# Patient Record
Sex: Female | Born: 1960 | Race: White | Hispanic: No | Marital: Single | Smoking: Never smoker
Health system: Southern US, Community
[De-identification: ages and names within clinical notes are randomized; demographics above are authoritative.]

## PROBLEM LIST (undated history)

## (undated) DIAGNOSIS — E7800 Pure hypercholesterolemia, unspecified: Secondary | ICD-10-CM

## (undated) DIAGNOSIS — K219 Gastro-esophageal reflux disease without esophagitis: Secondary | ICD-10-CM

## (undated) DIAGNOSIS — R112 Nausea with vomiting, unspecified: Secondary | ICD-10-CM

## (undated) DIAGNOSIS — T148XXA Other injury of unspecified body region, initial encounter: Secondary | ICD-10-CM

## (undated) DIAGNOSIS — Z9109 Other allergy status, other than to drugs and biological substances: Secondary | ICD-10-CM

## (undated) DIAGNOSIS — K648 Other hemorrhoids: Secondary | ICD-10-CM

## (undated) DIAGNOSIS — J189 Pneumonia, unspecified organism: Secondary | ICD-10-CM

## (undated) DIAGNOSIS — I10 Essential (primary) hypertension: Secondary | ICD-10-CM

## (undated) DIAGNOSIS — K5710 Diverticulosis of small intestine without perforation or abscess without bleeding: Secondary | ICD-10-CM

## (undated) DIAGNOSIS — D649 Anemia, unspecified: Secondary | ICD-10-CM

## (undated) DIAGNOSIS — K222 Esophageal obstruction: Secondary | ICD-10-CM

## (undated) DIAGNOSIS — U071 COVID-19: Secondary | ICD-10-CM

## (undated) DIAGNOSIS — E663 Overweight: Secondary | ICD-10-CM

## (undated) DIAGNOSIS — F32A Depression, unspecified: Secondary | ICD-10-CM

## (undated) DIAGNOSIS — G43909 Migraine, unspecified, not intractable, without status migrainosus: Secondary | ICD-10-CM

## (undated) DIAGNOSIS — M3500 Sicca syndrome, unspecified: Secondary | ICD-10-CM

## (undated) DIAGNOSIS — R040 Epistaxis: Secondary | ICD-10-CM

## (undated) DIAGNOSIS — I700 Atherosclerosis of aorta: Secondary | ICD-10-CM

## (undated) DIAGNOSIS — M329 Systemic lupus erythematosus, unspecified: Secondary | ICD-10-CM

## (undated) DIAGNOSIS — R0600 Dyspnea, unspecified: Secondary | ICD-10-CM

## (undated) DIAGNOSIS — L500 Allergic urticaria: Secondary | ICD-10-CM

## (undated) DIAGNOSIS — R7303 Prediabetes: Secondary | ICD-10-CM

## (undated) DIAGNOSIS — K920 Hematemesis: Secondary | ICD-10-CM

## (undated) DIAGNOSIS — F329 Major depressive disorder, single episode, unspecified: Secondary | ICD-10-CM

## (undated) DIAGNOSIS — J42 Unspecified chronic bronchitis: Secondary | ICD-10-CM

## (undated) DIAGNOSIS — E039 Hypothyroidism, unspecified: Secondary | ICD-10-CM

## (undated) DIAGNOSIS — N951 Menopausal and female climacteric states: Secondary | ICD-10-CM

## (undated) DIAGNOSIS — F419 Anxiety disorder, unspecified: Secondary | ICD-10-CM

## (undated) DIAGNOSIS — T7840XA Allergy, unspecified, initial encounter: Secondary | ICD-10-CM

## (undated) DIAGNOSIS — K2970 Gastritis, unspecified, without bleeding: Secondary | ICD-10-CM

## (undated) DIAGNOSIS — Z9889 Other specified postprocedural states: Secondary | ICD-10-CM

## (undated) DIAGNOSIS — K5790 Diverticulosis of intestine, part unspecified, without perforation or abscess without bleeding: Secondary | ICD-10-CM

## (undated) DIAGNOSIS — K449 Diaphragmatic hernia without obstruction or gangrene: Secondary | ICD-10-CM

## (undated) HISTORY — DX: Major depressive disorder, single episode, unspecified: F32.9

## (undated) HISTORY — DX: Hypothyroidism, unspecified: E03.9

## (undated) HISTORY — DX: Sjogren syndrome, unspecified: M35.00

## (undated) HISTORY — DX: Hematemesis: K92.0

## (undated) HISTORY — DX: Overweight: E66.3

## (undated) HISTORY — DX: Menopausal and female climacteric states: N95.1

## (undated) HISTORY — DX: Allergic urticaria: L50.0

## (undated) HISTORY — DX: Gastritis, unspecified, without bleeding: K29.70

## (undated) HISTORY — PX: HERNIA REPAIR: SHX51

## (undated) HISTORY — DX: Other hemorrhoids: K64.8

## (undated) HISTORY — DX: Epistaxis: R04.0

## (undated) HISTORY — PX: FOOT SURGERY: SHX648

## (undated) HISTORY — DX: Anxiety disorder, unspecified: F41.9

## (undated) HISTORY — DX: Atherosclerosis of aorta: I70.0

## (undated) HISTORY — DX: Allergy, unspecified, initial encounter: T78.40XA

## (undated) HISTORY — DX: Diverticulosis of small intestine without perforation or abscess without bleeding: K57.10

## (undated) HISTORY — DX: Depression, unspecified: F32.A

## (undated) HISTORY — DX: Diverticulosis of intestine, part unspecified, without perforation or abscess without bleeding: K57.90

## (undated) HISTORY — DX: Diaphragmatic hernia without obstruction or gangrene: K44.9

## (undated) HISTORY — DX: COVID-19: U07.1

## (undated) HISTORY — DX: Other injury of unspecified body region, initial encounter: T14.8XXA

## (undated) HISTORY — PX: INGUINAL HERNIA REPAIR: SUR1180

## (undated) HISTORY — DX: Gastro-esophageal reflux disease without esophagitis: K21.9

## (undated) HISTORY — DX: Pneumonia, unspecified organism: J18.9

## (undated) HISTORY — DX: Systemic lupus erythematosus, unspecified: M32.9

## (undated) HISTORY — DX: Esophageal obstruction: K22.2

---
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
## 1985-10-22 HISTORY — PX: EXPLORATORY LAPAROTOMY: SUR591

## 1985-10-22 HISTORY — PX: APPENDECTOMY: SHX54

## 1993-10-22 HISTORY — PX: SHOULDER OPEN ROTATOR CUFF REPAIR: SHX2407

## 1995-10-23 HISTORY — PX: SHOULDER SURGERY: SHX246

## 1996-10-22 HISTORY — PX: TUBAL LIGATION: SHX77

## 2005-06-20 ENCOUNTER — Emergency Department (HOSPITAL_COMMUNITY): Admission: EM | Admit: 2005-06-20 | Discharge: 2005-06-20 | Payer: Self-pay | Admitting: Family Medicine

## 2005-07-07 ENCOUNTER — Emergency Department (HOSPITAL_COMMUNITY): Admission: EM | Admit: 2005-07-07 | Discharge: 2005-07-07 | Payer: Self-pay | Admitting: Family Medicine

## 2005-08-04 ENCOUNTER — Emergency Department (HOSPITAL_COMMUNITY): Admission: EM | Admit: 2005-08-04 | Discharge: 2005-08-04 | Payer: Self-pay | Admitting: Family Medicine

## 2005-12-06 ENCOUNTER — Ambulatory Visit: Payer: Self-pay | Admitting: Internal Medicine

## 2006-01-01 ENCOUNTER — Ambulatory Visit: Payer: Self-pay | Admitting: Internal Medicine

## 2006-01-09 ENCOUNTER — Emergency Department (HOSPITAL_COMMUNITY): Admission: EM | Admit: 2006-01-09 | Discharge: 2006-01-09 | Payer: Self-pay | Admitting: Emergency Medicine

## 2006-11-08 ENCOUNTER — Ambulatory Visit: Payer: Self-pay | Admitting: Internal Medicine

## 2007-04-07 ENCOUNTER — Ambulatory Visit: Payer: Self-pay | Admitting: Internal Medicine

## 2007-04-11 ENCOUNTER — Encounter: Payer: Self-pay | Admitting: Internal Medicine

## 2007-04-11 ENCOUNTER — Telehealth: Payer: Self-pay | Admitting: Internal Medicine

## 2007-06-06 ENCOUNTER — Ambulatory Visit: Payer: Self-pay | Admitting: Internal Medicine

## 2007-06-13 LAB — CONVERTED CEMR LAB
BUN: 13 mg/dL
Basophils Absolute: 0 10*3/uL
Basophils Relative: 0 %
CO2: 19 meq/L
Calcium: 9.5 mg/dL
Chloride: 107 meq/L
Creatinine, Ser: 0.74 mg/dL
Eosinophils Absolute: 0.1 10*3/uL
Eosinophils Relative: 1 %
Ferritin: 6 ng/mL — ABNORMAL LOW
Folate: >20 ng/mL
Glucose, Bld: 86 mg/dL
HCT: 36.1 %
Hemoglobin: 11.1 g/dL — ABNORMAL LOW
Iron: 35 ug/dL — ABNORMAL LOW
Lymphocytes Relative: 25 %
Lymphs Abs: 2.1 10*3/uL
MCHC: 30.7 g/dL
MCV: 77.5 fL — ABNORMAL LOW
Monocytes Absolute: 0.9 10*3/uL — ABNORMAL HIGH
Monocytes Relative: 11 %
Neutro Abs: 5.5 10*3/uL
Neutrophils Relative %: 63 %
Platelets: 343 10*3/uL
Potassium: 3.9 meq/L
RBC: 4.66 M/uL
RDW: 15.9 % — ABNORMAL HIGH
Saturation Ratios: 8 % — ABNORMAL LOW
Sodium: 138 meq/L
TIBC: 425 ug/dL
UIBC: 390 ug/dL
Vitamin B-12: 537 pg/mL
WBC: 8.7 10*3/uL

## 2007-06-30 ENCOUNTER — Telehealth (INDEPENDENT_AMBULATORY_CARE_PROVIDER_SITE_OTHER): Payer: Self-pay | Admitting: *Deleted

## 2007-07-02 ENCOUNTER — Telehealth: Payer: Self-pay | Admitting: Internal Medicine

## 2007-07-03 ENCOUNTER — Ambulatory Visit: Payer: Self-pay | Admitting: Internal Medicine

## 2007-07-03 LAB — CONVERTED CEMR LAB
Basophils Absolute: 0 10*3/uL (ref 0.0–0.1)
Basophils Relative: 0 % (ref 0.0–1.0)
Bilirubin Urine: NEGATIVE
Eosinophils Absolute: 0.1 10*3/uL (ref 0.0–0.6)
Eosinophils Relative: 2 % (ref 0.0–5.0)
Glucose, Urine, Semiquant: NEGATIVE
HCT: 37 % (ref 36.0–46.0)
Hemoglobin: 12.6 g/dL (ref 12.0–15.0)
Ketones, urine, test strip: NEGATIVE
Lymphocytes Relative: 27.3 % (ref 12.0–46.0)
MCHC: 34.1 g/dL (ref 30.0–36.0)
MCV: 80.8 fL (ref 78.0–100.0)
Monocytes Absolute: 0.6 10*3/uL (ref 0.2–0.7)
Monocytes Relative: 11.2 % — ABNORMAL HIGH (ref 3.0–11.0)
Neutro Abs: 3 10*3/uL (ref 1.4–7.7)
Neutrophils Relative %: 59.5 % (ref 43.0–77.0)
Nitrite: NEGATIVE
Platelets: 281 10*3/uL (ref 150–400)
Protein, U semiquant: NEGATIVE
RBC: 4.58 M/uL (ref 3.87–5.11)
RDW: 21.4 % — ABNORMAL HIGH (ref 11.5–14.6)
Specific Gravity, Urine: <1.005
Urobilinogen, UA: NEGATIVE
WBC Urine, dipstick: NEGATIVE
WBC: 5.1 10*3/uL (ref 4.5–10.5)
pH: 5

## 2007-09-19 ENCOUNTER — Encounter (INDEPENDENT_AMBULATORY_CARE_PROVIDER_SITE_OTHER): Payer: Self-pay | Admitting: *Deleted

## 2007-10-31 ENCOUNTER — Telehealth (INDEPENDENT_AMBULATORY_CARE_PROVIDER_SITE_OTHER): Payer: Self-pay | Admitting: *Deleted

## 2008-01-21 ENCOUNTER — Encounter: Payer: Self-pay | Admitting: Obstetrics and Gynecology

## 2008-01-21 ENCOUNTER — Ambulatory Visit: Payer: Self-pay | Admitting: Gynecology

## 2008-02-18 ENCOUNTER — Ambulatory Visit: Payer: Self-pay | Admitting: Obstetrics and Gynecology

## 2008-02-18 ENCOUNTER — Other Ambulatory Visit: Admission: RE | Admit: 2008-02-18 | Discharge: 2008-02-18 | Payer: Self-pay | Admitting: Obstetrics and Gynecology

## 2008-02-18 ENCOUNTER — Encounter: Payer: Self-pay | Admitting: Obstetrics and Gynecology

## 2008-02-26 ENCOUNTER — Ambulatory Visit (HOSPITAL_COMMUNITY): Admission: RE | Admit: 2008-02-26 | Discharge: 2008-02-26 | Payer: Self-pay | Admitting: Obstetrics & Gynecology

## 2008-02-29 ENCOUNTER — Emergency Department (HOSPITAL_COMMUNITY): Admission: EM | Admit: 2008-02-29 | Discharge: 2008-02-29 | Payer: Self-pay | Admitting: Emergency Medicine

## 2008-03-08 ENCOUNTER — Encounter: Admission: RE | Admit: 2008-03-08 | Discharge: 2008-03-08 | Payer: Self-pay | Admitting: Family Medicine

## 2008-05-13 ENCOUNTER — Ambulatory Visit: Payer: Self-pay | Admitting: Obstetrics and Gynecology

## 2008-05-16 ENCOUNTER — Emergency Department (HOSPITAL_BASED_OUTPATIENT_CLINIC_OR_DEPARTMENT_OTHER): Admission: EM | Admit: 2008-05-16 | Discharge: 2008-05-16 | Payer: Self-pay | Admitting: Emergency Medicine

## 2008-06-07 ENCOUNTER — Emergency Department (HOSPITAL_BASED_OUTPATIENT_CLINIC_OR_DEPARTMENT_OTHER): Admission: EM | Admit: 2008-06-07 | Discharge: 2008-06-07 | Payer: Self-pay | Admitting: Emergency Medicine

## 2008-08-05 ENCOUNTER — Ambulatory Visit: Payer: Self-pay | Admitting: Obstetrics & Gynecology

## 2008-08-12 ENCOUNTER — Encounter: Admission: RE | Admit: 2008-08-12 | Discharge: 2008-08-12 | Payer: Self-pay | Admitting: Obstetrics & Gynecology

## 2008-08-26 ENCOUNTER — Ambulatory Visit: Payer: Self-pay | Admitting: Cardiology

## 2008-08-26 ENCOUNTER — Telehealth (INDEPENDENT_AMBULATORY_CARE_PROVIDER_SITE_OTHER): Payer: Self-pay | Admitting: *Deleted

## 2008-08-26 ENCOUNTER — Ambulatory Visit: Payer: Self-pay | Admitting: Internal Medicine

## 2008-08-31 ENCOUNTER — Telehealth: Payer: Self-pay | Admitting: Internal Medicine

## 2008-10-08 ENCOUNTER — Encounter (INDEPENDENT_AMBULATORY_CARE_PROVIDER_SITE_OTHER): Payer: Self-pay | Admitting: *Deleted

## 2008-12-24 ENCOUNTER — Emergency Department (HOSPITAL_BASED_OUTPATIENT_CLINIC_OR_DEPARTMENT_OTHER): Admission: EM | Admit: 2008-12-24 | Discharge: 2008-12-24 | Payer: Self-pay | Admitting: Emergency Medicine

## 2008-12-24 ENCOUNTER — Ambulatory Visit: Payer: Self-pay | Admitting: Radiology

## 2009-05-05 ENCOUNTER — Emergency Department (HOSPITAL_BASED_OUTPATIENT_CLINIC_OR_DEPARTMENT_OTHER): Admission: EM | Admit: 2009-05-05 | Discharge: 2009-05-05 | Payer: Self-pay | Admitting: Emergency Medicine

## 2009-07-26 ENCOUNTER — Emergency Department (HOSPITAL_BASED_OUTPATIENT_CLINIC_OR_DEPARTMENT_OTHER): Admission: EM | Admit: 2009-07-26 | Discharge: 2009-07-26 | Payer: Self-pay | Admitting: Emergency Medicine

## 2009-09-01 ENCOUNTER — Ambulatory Visit: Payer: Self-pay | Admitting: Obstetrics & Gynecology

## 2009-09-01 ENCOUNTER — Encounter: Payer: Self-pay | Admitting: Obstetrics & Gynecology

## 2009-09-02 ENCOUNTER — Encounter: Payer: Self-pay | Admitting: Obstetrics & Gynecology

## 2009-09-02 LAB — CONVERTED CEMR LAB
Pap Smear: NORMAL
TSH: 14.707 microintl units/mL — ABNORMAL HIGH (ref 0.350–4.500)

## 2009-09-09 ENCOUNTER — Ambulatory Visit: Payer: Self-pay | Admitting: Family Medicine

## 2009-09-09 ENCOUNTER — Encounter: Payer: Self-pay | Admitting: Family Medicine

## 2009-09-09 DIAGNOSIS — I10 Essential (primary) hypertension: Secondary | ICD-10-CM | POA: Insufficient documentation

## 2009-09-09 DIAGNOSIS — E039 Hypothyroidism, unspecified: Secondary | ICD-10-CM | POA: Insufficient documentation

## 2009-09-09 LAB — CONVERTED CEMR LAB
BUN: 9 mg/dL (ref 6–23)
CO2: 20 meq/L (ref 19–32)
Calcium: 9.6 mg/dL (ref 8.4–10.5)
Chloride: 106 meq/L (ref 96–112)
Creatinine, Ser: 0.84 mg/dL (ref 0.40–1.20)
Free T4: 1.22 ng/dL (ref 0.80–1.80)
Glucose, Bld: 94 mg/dL (ref 70–99)
Potassium: 4.1 meq/L (ref 3.5–5.3)
Sodium: 141 meq/L (ref 135–145)

## 2009-09-12 ENCOUNTER — Telehealth: Payer: Self-pay | Admitting: Family Medicine

## 2009-09-13 ENCOUNTER — Encounter: Payer: Self-pay | Admitting: Family Medicine

## 2009-09-13 LAB — CONVERTED CEMR LAB: T3, Free: 3 pg/mL (ref 2.3–4.2)

## 2009-09-23 ENCOUNTER — Ambulatory Visit (HOSPITAL_COMMUNITY): Admission: RE | Admit: 2009-09-23 | Discharge: 2009-09-23 | Payer: Self-pay | Admitting: Obstetrics & Gynecology

## 2009-10-04 ENCOUNTER — Ambulatory Visit: Payer: Self-pay | Admitting: Family Medicine

## 2009-10-04 DIAGNOSIS — G43909 Migraine, unspecified, not intractable, without status migrainosus: Secondary | ICD-10-CM | POA: Insufficient documentation

## 2009-10-04 LAB — CONVERTED CEMR LAB
Bilirubin Urine: NEGATIVE
Glucose, Urine, Semiquant: NEGATIVE
Ketones, urine, test strip: NEGATIVE
Nitrite: NEGATIVE
Protein, U semiquant: NEGATIVE
Specific Gravity, Urine: 1.02
Urobilinogen, UA: 0.2
WBC Urine, dipstick: NEGATIVE
pH: 5.5

## 2009-10-05 ENCOUNTER — Encounter: Payer: Self-pay | Admitting: Family Medicine

## 2009-10-12 ENCOUNTER — Ambulatory Visit: Payer: Self-pay | Admitting: Family Medicine

## 2009-10-12 DIAGNOSIS — F331 Major depressive disorder, recurrent, moderate: Secondary | ICD-10-CM | POA: Insufficient documentation

## 2009-10-12 LAB — CONVERTED CEMR LAB
Ketones, urine, test strip: NEGATIVE
Nitrite: NEGATIVE
Urobilinogen, UA: 0.2

## 2009-10-17 ENCOUNTER — Ambulatory Visit (HOSPITAL_COMMUNITY): Admission: RE | Admit: 2009-10-17 | Discharge: 2009-10-17 | Payer: Self-pay | Admitting: Family Medicine

## 2009-10-17 ENCOUNTER — Telehealth: Payer: Self-pay | Admitting: Family Medicine

## 2009-11-08 ENCOUNTER — Ambulatory Visit: Payer: Self-pay | Admitting: Family Medicine

## 2009-11-08 ENCOUNTER — Encounter: Payer: Self-pay | Admitting: Family Medicine

## 2009-11-08 LAB — CONVERTED CEMR LAB: Free T4: 1.78 ng/dL (ref 0.80–1.80)

## 2009-12-05 ENCOUNTER — Telehealth: Payer: Self-pay | Admitting: Family Medicine

## 2009-12-06 ENCOUNTER — Ambulatory Visit: Payer: Self-pay | Admitting: Family Medicine

## 2009-12-22 ENCOUNTER — Telehealth: Payer: Self-pay | Admitting: Family Medicine

## 2010-01-12 ENCOUNTER — Encounter: Payer: Self-pay | Admitting: Family Medicine

## 2010-01-12 ENCOUNTER — Ambulatory Visit: Payer: Self-pay | Admitting: Family Medicine

## 2010-01-13 LAB — CONVERTED CEMR LAB: TSH: 0.04 microintl units/mL — ABNORMAL LOW (ref 0.350–4.500)

## 2010-01-24 ENCOUNTER — Ambulatory Visit: Payer: Self-pay | Admitting: Family Medicine

## 2010-02-12 ENCOUNTER — Emergency Department (HOSPITAL_BASED_OUTPATIENT_CLINIC_OR_DEPARTMENT_OTHER): Admission: EM | Admit: 2010-02-12 | Discharge: 2010-02-12 | Payer: Self-pay | Admitting: Emergency Medicine

## 2010-02-20 ENCOUNTER — Telehealth: Payer: Self-pay | Admitting: Family Medicine

## 2010-02-23 ENCOUNTER — Encounter: Payer: Self-pay | Admitting: Family Medicine

## 2010-02-23 ENCOUNTER — Ambulatory Visit: Payer: Self-pay | Admitting: Family Medicine

## 2010-02-27 ENCOUNTER — Telehealth: Payer: Self-pay | Admitting: Family Medicine

## 2010-03-13 ENCOUNTER — Ambulatory Visit: Payer: Self-pay | Admitting: Family Medicine

## 2010-03-13 ENCOUNTER — Encounter: Payer: Self-pay | Admitting: *Deleted

## 2010-03-13 ENCOUNTER — Encounter: Payer: Self-pay | Admitting: Family Medicine

## 2010-03-15 ENCOUNTER — Ambulatory Visit: Payer: Self-pay | Admitting: Family Medicine

## 2010-03-21 ENCOUNTER — Encounter: Payer: Self-pay | Admitting: Family Medicine

## 2010-03-21 LAB — CONVERTED CEMR LAB: TSH: 9.028 microintl units/mL — ABNORMAL HIGH (ref 0.350–4.500)

## 2010-03-23 ENCOUNTER — Encounter: Payer: Self-pay | Admitting: Family Medicine

## 2010-03-23 ENCOUNTER — Ambulatory Visit: Payer: Self-pay | Admitting: Family Medicine

## 2010-03-23 ENCOUNTER — Telehealth: Payer: Self-pay | Admitting: Family Medicine

## 2010-03-23 LAB — CONVERTED CEMR LAB
Nitrite: NEGATIVE
Protein, U semiquant: NEGATIVE
Specific Gravity, Urine: 1.015

## 2010-03-24 ENCOUNTER — Telehealth: Payer: Self-pay | Admitting: *Deleted

## 2010-03-24 LAB — CONVERTED CEMR LAB
Chloride: 102 meq/L (ref 96–112)
Potassium: 3.8 meq/L (ref 3.5–5.3)

## 2010-03-27 ENCOUNTER — Ambulatory Visit: Payer: Self-pay | Admitting: Family Medicine

## 2010-03-29 ENCOUNTER — Telehealth (INDEPENDENT_AMBULATORY_CARE_PROVIDER_SITE_OTHER): Payer: Self-pay | Admitting: *Deleted

## 2010-03-30 ENCOUNTER — Ambulatory Visit: Payer: Self-pay | Admitting: Family Medicine

## 2010-05-12 ENCOUNTER — Ambulatory Visit: Payer: Self-pay | Admitting: Family Medicine

## 2010-06-23 ENCOUNTER — Ambulatory Visit: Payer: Self-pay | Admitting: Family Medicine

## 2010-10-06 ENCOUNTER — Encounter: Payer: Self-pay | Admitting: Family Medicine

## 2010-11-21 NOTE — Assessment & Plan Note (Signed)
Summary: TO SEE SYKES AT 12:00/KH   Vital Signs:  Patient profile:   50 year old female Height:      66 inches Weight:      181.0 pounds BMI:     29.32  Vitals Entered By: Wyona Almas PHD (March 30, 2010 12:21 PM)  Primary Care Provider:  Clementeen Graham MD   History of Present Illness: Assessment:  Spent 30 minutes with pt.  24-hr recall was not representative of usual b/c it was an exam day for Kacey, and she is getting over an upper respiratory infection, so has little appetite: B (5:30 AM)- 6 oz Coke; Snks (PM)- water; D (4:30 PM)- 1/1/2 c Honey Nut Cheerios w/ 1/2 c 2% milk.  Now that she is feeling better, and her semester is over as of today, she feels more able to get back to better eating and physical activity.    Nutrition Diagnosis:  Some progress on physical inactivity (NB-2.1) (prior to getting sick) related to time constraints (full-time std status and part-time employmt) as evidenced by self-reported increase in stairs and other "exercise opportunities."  Regression on disordered eating pattern (NB-1.5) related to being sick recently as evidenced by yesterday's intake of only one "meal," and a total kcal intake of  ~355.   Intervention:  See Patient Instructions.    Monitoring/Eval:  Dietary intake, body weight, and exercise at 4-wk F/U.      Allergies: 1)  ! Codeine 2)  ! Erythromycin   Complete Medication List: 1)  Hydrochlorothiazide 25 Mg Tabs (Hydrochlorothiazide) .... Take 1 pill by mouth daily 2)  Sprintec 28 0.25-35 Mg-mcg Tabs (Norgestimate-eth estradiol) 3)  Daily Vitamins For Women Tabs (Multiple vitamins-calcium) 4)  Synthroid 88 Mcg Tabs (Levothyroxine sodium) .Marland Kitchen.. 1 by mouth daily 5)  Claritin 10 Mg Tabs (Loratadine) .Marland Kitchen.. 1 by mouth daily 6)  Zoloft 100 Mg Tabs (Sertraline hcl) .Marland Kitchen.. 1 by mouth daily 7)  Propranolol Hcl 40 Mg Tabs (Propranolol hcl) .Marland Kitchen.. 1 daily 1 week prior to period to prevent migranes. 8)  Keflex 500 Mg Caps (Cephalexin) .Marland Kitchen.. 1 by mouth  two times a day x 3 days  Other Orders: Reassessment Each 15 min unit- [facility] ([id number])  Patient Instructions: 1)  Carry a water bottle with you, and DRINK!  Goal: 6-7 cups of water per day. Aim for at least 4 cups before 2 PM.   2)  Eat at least 3 meals and 1-2 snacks per day.  To make this happen, PLANNING AHEAD will be crucial.   3)  As you feel better, get back to those exercise opportunities, and keep walking the dog!  4)  Reminder:  Make sure dinner looks like, feels like, and tastes like a real dinner, i.e., include a protein source, starch food, and veg's.  Cooked veg's or a salad feel more like a meal than more raw veg's, which you are likely to tire of if you eat at every meal.   5)  With both lunch and dinner, try to plan around vegetables.  6)  Schedule a nutrition appt for July:  [phone].

## 2010-11-21 NOTE — Assessment & Plan Note (Signed)
Summary: Route to [facility] / [facility]   Vital Signs:  Patient profile:   50 year old female Height:      66 inches Weight:      181.8 pounds BMI:     29.45  Vitals Entered By: Wyona Almas PHD (Mar 13, 2010 3:10 PM)  Primary Care Provider:  Clementeen Graham MD   History of Present Illness: Assessment:  Spent 60 min with pt.  Gina Wise has made many changes, including avoiding soda and chocolate, eating at least a small amount of food for breakfast and lunch, incorporating vegetables daily, and decreasing salt and margarine.  She is frustrated that her weight is back up a couple of pounds, but acknowledges it may be b/c she is premenstrual.  She has not increased exercise, but still walks the dog  ~10 min 2 X day.   She and her family lost their residence, and are living at [facility] for now, on the 4th floor.  She has made a habit of using the stairs there, and says it is much easier now than when she began.  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  Financial constraints also make good food choices challenging, although her children are eligible for food stamps (as a student, she is not unless she also works at least 20 hr/wk).  24-hr recall suggests kcal intake of  ~1,000: B (6 AM)- strawberries, string chs; Snk (9 AM)- raw veg's, str chs; L (11:30 AM)- 1/2 pb & j, carrots; D (5:30 PM)- croissant bac, egg, chs sandwich, raw veg's w/ lite ranch.  Nutrition Diagnosis:  No progress on physical inactivity (NB-2.1) related to time constraints (full-time std status and part-time employmt) as evidenced by limited aerobic activity and no strength training. Slight progress on disordered eating pattern (NB-1.5) related to no appetite and time constraints as evidenced by reported typical intake of 1,000 kcal, but inclusion of 3 meals & 1-2 snacks most days.     Intervention:  See Patient Instructions.    Monitoring/Eval:  Dietary  intake, body weight, and exercise at 2-wk F/U.      Allergies: 1)  ! Codeine 2)  ! Erythromycin   Complete Medication List: 1)  Hydrochlorothiazide 25 Mg Tabs (Hydrochlorothiazide) .... Take 1 pill by mouth daily 2)  Sprintec 28 0.25-35 Mg-mcg Tabs (Norgestimate-eth estradiol) 3)  Daily Vitamins For Women Tabs (Multiple vitamins-calcium) 4)  Synthroid 75 Mcg Tabs (Levothyroxine sodium) .Marland Kitchen.. 1 by mouth daily 5)  Claritin 10 Mg Tabs (Loratadine) .Marland Kitchen.. 1 by mouth daily 6)  Zoloft 100 Mg Tabs (Sertraline hcl) .Marland Kitchen.. 1 by mouth daily 7)  Propranolol Hcl 40 Mg Tabs (Propranolol hcl) .Marland Kitchen.. 1 daily 1 week prior to period to prevent migranes.  Other Orders: Reassessment Each 15 min unit- [facility] [phone])  Patient Instructions: 1)  Congratulations on trying beans, eating more veg's, and for choosing more fiber-rich foods.  You are also doing a great job planning ahead, and bringing foods to school with you.  Glad to hear you are feeling more energetic.   2)  Dinner meal:  Make sure it looks like, feels like, and tastes like a real dinner, i.e., include a protein source, starch food, and veg's.  Cooked veg's or a salad feel more like a meal than more raw veg's, which you are likely to tire of if you eat at every meal.   3)  Remember the importance of satsifaction  from every meal! 4)  Breakfast cereals:  Look for at least 5 grams of fiber per serving.  5)  Increase walking as you are able.  6)  Try some of the beans and greens ideas we discussed today.  7)  Follow-up:  June 6 at 1:30 PM (Nutrition appt on Nurse schedule).

## 2010-11-21 NOTE — Progress Notes (Signed)
Summary: phn msg  Phone Note Call from Patient Call back at Home Phone [phone]   Caller: Patient Summary of Call: pt had tb test on Monday and won't be able to get here until appt w/ sykes tomorrow.  wants to know if that will be OK Initial call taken by: De Nurse,  March 29, 2010 3:50 PM  Follow-up for Phone Call        patient advised that tomorrow will be fine to check PPD. she will be in at 12:00. Theresia Lo RN  March 29, 2010 3:57 PM  Follow-up by: Theresia Lo RN,  March 29, 2010 3:57 PM

## 2010-11-21 NOTE — Letter (Signed)
Summary: Generic Letter  [facility]  [address]   [place], [place] [address]   Phone: [phone]  Fax: [phone]    03/21/2010  Gina Wise [address] [place], [place]  [address]  Dear Ms. Holness,    Your thyroid test (TSH) was elevated indicating that we are not  giving you enough thyroid hormone.  was too much and it appears that is too little.  There is a sized pill.  I have sent a new Rx to the [facility] that you use.  When your Synthroid runs out please switch to the new dose. Please call me and leave me a working phone number when you get a chance or have any questions.    Sincerely,   Clementeen Graham MD  Appended Document: Generic Letter mailed.

## 2010-11-21 NOTE — Assessment & Plan Note (Signed)
Summary: PPD reading  Nurse Visit   Allergies: 1)  ! Codeine 2)  ! Erythromycin  PPD Results    Date of reading: 03/15/2010    Results: < 5mm    Interpretation: negative  Orders Added: 1)  No Charge Patient Arrived (NCPA0) [NCPA0]

## 2010-11-21 NOTE — Assessment & Plan Note (Signed)
Summary: f/up med ck,tcb   Vital Signs:  Patient profile:   50 year old female Weight:      182 pounds Temp:     96.9 degrees F oral Pulse rate:   70 / minute BP sitting:   116 / 76  (left arm) Cuff size:   regular CC: F/u meds and thyroid.   Primary Care Provider:  Clementeen Graham MD  CC:  F/u meds and thyroid.Marland Kitchen  History of Present Illness: My Mcphearson presents for f/u of her depression and to check her thryorid level  Depression: She is doing much better than she was a few months ago.  She is sleeping better and much less anxious.  She notes that she is crying less as well.  She does however continue to note some tinnitus with her SSRI.  Shehas tried severl different drugs and has settled on Zoloft.  This produces a tolerable level of tinnitus.   She is feeling well otherwise.   Thyroid.  She is feeling well.  She denies any hair loss, feeling too hot or too cold, or excessive fatigue.   Habits & Providers  Alcohol-Tobacco-Diet     Tobacco Status: never  Current Medications (verified): 1)  Hydrochlorothiazide 25 Mg Tabs (Hydrochlorothiazide) .... Take 1 Pill By Mouth Daily 2)  Sprintec 28 0.25-35 Mg-Mcg Tabs (Norgestimate-Eth Estradiol) 3)  Daily Vitamins For Women  Tabs (Multiple Vitamins-Calcium) 4)  Synthroid 75 Mcg Tabs (Levothyroxine Sodium) .Marland Kitchen.. 1 By Mouth Daily 5)  Zithromax 500 Mg Tabs (Azithromycin) .Marland Kitchen.. 1 Pill By Mouth X 3 Days 6)  Tessalon Perles 100 Mg Caps (Benzonatate) .Marland Kitchen.. 1-2 By Mouth Q 8 Hrs As Needed Cough 7)  Zyrtec Allergy 10 Mg Caps (Cetirizine Hcl) 8)  Zoloft 100 Mg Tabs (Sertraline Hcl) .Marland Kitchen.. 1 By Mouth Daily  Allergies (verified): 1)  ! Codeine 2)  ! 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  Risk Factors: Smoking Status: never (01/12/2010) Passive Smoke Exposure: no (04/07/2007)  Review of Systems       Please see HPI otherwise the 6 item ROS is ngative.  Physical Exam  General:  Vs noted. Well appearing.  NAD Neck:  Palpable thyroid gland, no distinct nodules, no adenopathy, non tender Lungs:  normal respiratory effort and normal breath sounds.   Heart:  normal rate and regular rhythm.   Abdomen:  Non distended, NABS, soft, nontender, no guarding or rebound.   Extremities:  Non-edemetus Psych:  Cognition and judgment appear intact. Alert and cooperative with normal attention span and concentration. 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  If tinnitus worsens will decrease back to 50.  Will f/u in 2-3 months.  Orders: [facility]- Est Level  3 ([id number])  Problem # 2:  UNSPECIFIED HYPOTHYROIDISM (ICD-244.9) Will assess a TSH and free T4 today.  Will titrate synthroid dose to a normal TSH.  Her updated medication list for this problem includes:    Synthroid 75 Mcg Tabs (Levothyroxine sodium) .Marland Kitchen... 1 by mouth daily  Orders: Free T4-[facility] [phone]) TSH-[facility] [phone]) [facility]- Est Level  3 ([id number])  Labs Reviewed: TSH: 0.341 (11/08/2009)     Complete Medication List: 1)  Hydrochlorothiazide 25 Mg Tabs (Hydrochlorothiazide) .... Take 1 pill by mouth daily 2)  Sprintec 28 0.25-35 Mg-mcg Tabs (Norgestimate-eth estradiol) 3)  Daily Vitamins  For Women Tabs (Multiple vitamins-calcium) 4)  Synthroid 75 Mcg Tabs (Levothyroxine sodium) .Marland Kitchen.. 1 by mouth daily 5)  Zithromax 500 Mg Tabs (Azithromycin) .Marland Kitchen.. 1 pill by mouth x 3 days 6)  Tessalon Perles 100 Mg Caps (Benzonatate) .Marland Kitchen.. 1-2 by mouth q 8 hrs as needed cough 7)  Zyrtec Allergy 10 Mg Caps (Cetirizine hcl) 8)  Zoloft 100 Mg Tabs (Sertraline hcl) .Marland Kitchen.. 1 by mouth daily  Other Orders: Nutrition Referral (Nutrition)  Patient Instructions: 1)  Thank you for seeing me today. 2)  Please make an appointment with Dr. Gerilyn Pilgrim. 3)  I will call you with your results, I will leave a message. 4)  Let me know if you cannot tolerate the increased zoloft dose. 5)  Please schedule a follow-up appointment in 3 months .  Prescriptions: SYNTHROID 75 MCG TABS (LEVOTHYROXINE SODIUM) 1 by mouth daily  #30 x 12   Entered and Authorized by:   Clementeen Graham MD   Signed by:   Clementeen Graham MD on 01/13/2010   Method used:   Electronically to        [facility] (retail)       [address]       [place], [place]  [address]       Ph: [phone]       Fax: [phone]   RxID:   [id number] ZOLOFT 100 MG TABS (SERTRALINE HCL) 1 by mouth daily  #30 x 12   Entered and Authorized by:   Clementeen Graham MD   Signed by:   Clementeen Graham MD on 01/12/2010   Method used:   Electronically to        [facility] (retail)       [address]       [place], [place]  [address]       Ph: [phone]       Fax: [phone]   RxID:   [id number]    Prevention & Chronic Care Immunizations   Influenza vaccine: Not documented   Influenza vaccine deferral: Deferred  (09/09/2009)    Tetanus booster: Not documented   Td booster deferral: Deferred  (09/09/2009)    Pneumococcal vaccine: Not documented  Other Screening   Pap smear: normal  (09/02/2009)   Pap smear due: 09/02/2010    Mammogram: BI-RADS CATEGORY 2:  Benign finding(s).^MM  DIGITAL DIAGNOSTIC UNILAT R  (08/12/2008)   Mammogram action/deferral: Ordered  (09/09/2009)   Mammogram due: 08/12/2009   Smoking status: never  (01/12/2010)  Lipids   Total Cholesterol: Not documented   Lipid panel action/deferral: Deferred   LDL: Not documented   LDL Direct: Not documented   HDL: Not documented   Triglycerides: Not documented  Hypertension   Last Blood Pressure: 116 / 76  (01/12/2010)   Serum creatinine: 0.84  (09/09/2009)   Serum potassium 4.1  (09/09/2009)    Hypertension flowsheet reviewed?: Yes   Progress toward BP goal: At goal  Self-Management Support :  Personal Goals (by the next clinic visit) :      Personal blood pressure goal: 140/90  (09/09/2009)   Patient will work on the following items until the next clinic visit to reach self-care goals:     Medications and monitoring: take my medicines every day, check my blood pressure, bring all of my medications to every visit  (01/12/2010)     Activity: take a 30 minute walk every day  (09/09/2009)   Referred.    Hypertension self-management support: BP self-monitoring log  (09/09/2009)

## 2010-11-21 NOTE — Progress Notes (Signed)
Summary: Rx Prob  Phone Note Call from Patient Call back at Home Phone [phone]   Caller: Patient Summary of Call: Pt wants to talk to Dr. Denyse Amass about the Celexa she is taking.  Causing ringing in her ears. Stopped taking it and the ringing in her ears has gotten better.  Is there something else he can try her on? Initial call taken by: Clydell Hakim,  December 05, 2009 12:23 PM  Follow-up for Phone Call        will forward  to MD. Follow-up by: Theresia Lo RN,  December 05, 2009 12:25 PM  Additional Follow-up for Phone Call Additional follow up Details #1::        Called back, Has been several days since last celexa dose.  Switched to fluoxetine 10 daily.   Will f/u in a few weeks.    New/Updated Medications: FLUOXETINE HCL 10 MG CAPS (FLUOXETINE HCL) 1 by mouth daily Prescriptions: FLUOXETINE HCL 10 MG CAPS (FLUOXETINE HCL) 1 by mouth daily  #30 x 5   Entered and Authorized by:   Clementeen Graham MD   Signed by:   Clementeen Graham MD on 12/06/2009   Method used:   Electronically to        [facility] (retail)       [address]       [place], [place]  [address]       Ph: [phone]       Fax: [phone]   RxID:   [id number]

## 2010-11-21 NOTE — Progress Notes (Signed)
Summary: triage  Phone Note Call from Patient Call back at [phone]   Caller: Patient Summary of Call: has another uti and cannot afford gas to come in.  wants to know if something can be called in [facility] [address] Initial call taken by: De Nurse,  March 23, 2010 1:56 PM  Follow-up for Phone Call        c/o burning,itching & pain. wanted to wait until monday when she will be here for Dr. Gerilyn Pilgrim & TB test. states she has been drinking a lot but is not voiding much. told her she needed to be seen today. she will be here before 3pm having financial issues. will need help with meds. desires $4 plan  Follow-up by: Golden Circle RN,  March 23, 2010 2:10 PM

## 2010-11-21 NOTE — Assessment & Plan Note (Signed)
Summary: cough,tcb   Vital Signs:  Patient profile:   50 year old female Height:      66 inches Weight:      182.9 pounds BMI:     29.63 Temp:     97.3 degrees F oral Pulse rate:   75 / minute BP sitting:   117 / 83  (left arm) Cuff size:   regular  Vitals Entered By: Gladstone Pih (December 06, 2009 1:35 PM) CC: C/O cough, N/V, HA Is Patient Diabetic? No Pain Assessment Patient in pain? no        Primary Care Provider:  Clementeen Graham MD  CC:  C/O cough, N/V, and HA.  History of Present Illness: COUGH Onset: 4 weeks Description: Productive of mucous Modifying factors: Also has 1 week of headache, and a post nasal drip.   Symptoms Productive: Yes Wheezing: No Dyspnea: No Nasal discharge: No Fever: No Sore throat: No Sick contacts: Yes Heartburn symptoms: No  History of Asthma: No  Red Flags  Weight loss: No Hemoptysis: No Edema: No  Takes zyrtec.      Habits & Providers  Alcohol-Tobacco-Diet     Tobacco Status: never  Current Medications (verified): 1)  Hydrochlorothiazide 25 Mg Tabs (Hydrochlorothiazide) .... Take 1 Pill By Mouth Daily 2)  Sprintec 28 0.25-35 Mg-Mcg Tabs (Norgestimate-Eth Estradiol) 3)  Daily Vitamins For Women  Tabs (Multiple Vitamins-Calcium) 4)  Synthroid 100 Mcg Tabs (Levothyroxine Sodium) .Marland Kitchen.. 1 Tab By Mouth Daily 5)  Zithromax 500 Mg Tabs (Azithromycin) .Marland Kitchen.. 1 Pill By Mouth X 3 Days 6)  Fluoxetine Hcl 10 Mg Caps (Fluoxetine Hcl) .Marland Kitchen.. 1 By Mouth Daily 7)  Tessalon Perles 100 Mg Caps (Benzonatate) .Marland Kitchen.. 1-2 By Mouth Q 8 Hrs As Needed Cough 8)  Zyrtec Allergy 10 Mg Caps (Cetirizine Hcl)  Allergies (verified): 1)  ! Codeine 2)  ! 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  Risk Factors: Smoking Status: never (12/06/2009) Passive Smoke Exposure: no (04/07/2007)  Review of Systems       Please see HPI.  Also has face pain today.  Bilateral.   Physical Exam  General:  Vs noted. Well appearing.  NAD Head:  normocephalic, atraumatic, no abnormalities observed, and no abnormalities palpated.   Eyes:  vision grossly intact, pupils equal, and pupils round.   Ears:  External ear exam shows no significant lesions or deformities.  Otoscopic examination reveals clear canals, tympanic membranes are intact bilaterally without bulging, retraction, inflammation or discharge. Hearing is grossly normal bilaterally. Nose:  External nasal examination shows no deformity or inflammation. Nasal mucosa are pink and moist without lesions or exudates. Mouth:  MMM.   Could not visulize the posterior pharynx. Neck:  Palpable thyroid gland, no distinct nodules, no adenopathy, non tender Lungs:  normal respiratory effort and normal breath sounds.   Heart:  normal rate and regular rhythm.   Skin:  No rashes or lesions noted.   Impression & Recommendations:  Problem # 1:  COUGH (ICD-786.2) Assessment New  Viral vs post bronchitis  Plan to offer supportive care with tesselon perrles. Will also treat nasal congestion with flonase nasal spray.   Pt may use tylenol or NSAIDs for face pain.  Red flags given.  Orders: [facility]- Est Level  3 ([id number])  Complete Medication List: 1)  Hydrochlorothiazide 25 Mg Tabs (Hydrochlorothiazide) .... Take 1 pill by mouth daily 2)  Sprintec 28 0.25-35 Mg-mcg Tabs (Norgestimate-eth estradiol) 3)  Daily Vitamins  For Women Tabs (Multiple vitamins-calcium) 4)  Synthroid 100 Mcg Tabs (Levothyroxine sodium) .Marland Kitchen.. 1 tab by mouth daily 5)  Zithromax 500 Mg Tabs (Azithromycin) .Marland Kitchen.. 1 pill by mouth x 3 days 6)  Fluoxetine Hcl 10 Mg Caps (Fluoxetine hcl) .Marland Kitchen.. 1 by mouth daily 7)  Tessalon Perles 100 Mg Caps (Benzonatate) .Marland Kitchen.. 1-2 by mouth q 8 hrs as needed cough 8)  Zyrtec Allergy 10 Mg Caps (Cetirizine hcl)  Patient Instructions: 1)  Thank you for seeing me today. 2)  Take alive 2 pills in the AM for face pain. 3)  Try the flonase nasal spray 2 puffs each nostril twice a day. 4)  You can use an OTC decongestient for congestion. 5)  Come back if you have a fever, or bad nasal drainage call.   I will call in an antibiotic. Prescriptions: TESSALON PERLES 100 MG CAPS (BENZONATATE) 1-2 by mouth q 8 hrs as needed cough  #30 x 0   Entered and Authorized by:   Clementeen Graham MD   Signed by:   Clementeen Graham MD on 12/06/2009   Method used:   Electronically to        [facility] (retail)       [address]       [place], [place]  [address]       Ph: [phone]       Fax: [phone]   RxID:   [id number]    Prevention & Chronic Care Immunizations   Influenza vaccine: Not documented   Influenza vaccine deferral: Deferred  (09/09/2009)    Tetanus booster: Not documented   Td booster deferral: Deferred  (09/09/2009)    Pneumococcal vaccine: Not documented  Other Screening   Pap smear: normal  (09/02/2009)   Pap smear due: 09/02/2010    Mammogram: BI-RADS CATEGORY 2:  Benign finding(s).^MM DIGITAL DIAGNOSTIC UNILAT R  (08/12/2008)   Mammogram action/deferral: Ordered  (09/09/2009)   Mammogram due: 08/12/2009   Smoking status: never  (12/06/2009)  Lipids   Total Cholesterol: Not documented   Lipid panel action/deferral: Deferred   LDL: Not documented   LDL Direct: Not documented   HDL: Not documented   Triglycerides: Not documented  Hypertension   Last  Blood Pressure: 117 / 83  (12/06/2009)   Serum creatinine: 0.84  (09/09/2009)   Serum potassium 4.1  (09/09/2009)    Hypertension flowsheet reviewed?: Yes   Progress toward BP goal: At goal  Self-Management Support :   Personal Goals (by the next clinic visit) :      Personal blood pressure goal: 140/90  (09/09/2009)   Hypertension self-management support: BP self-monitoring log  (09/09/2009)

## 2010-11-21 NOTE — Assessment & Plan Note (Signed)
Summary: cpe,tcb   Vital Signs:  Patient profile:   50 year old female Height:      66 inches Weight:      178.1 pounds Temp:     98 degrees F oral Pulse rate:   88 / minute Pulse rhythm:   regular BP sitting:   124 / 83  (left arm) Cuff size:   regular  Vitals Entered By: Loralee Pacas CMA (Feb 23, 2010 2:23 PM) CC: Fill out form and f/u chronic issues  Vision Screening:Left eye w/o correction: 20 / 80 Right Eye w/o correction: 20 / 80 Both eyes w/o correction:  20/ 80 Left eye with correction: 20 / 20 Right eye with correction: 20 / 20 Both eyes with correction: 20 / 20        Vision Entered By: Loralee Pacas CMA (Feb 23, 2010 2:36 PM)   Primary Care Provider:  Clementeen Graham MD  CC:  Fill out form and f/u chronic issues.  History of Present Illness: Form: For externship:  Vision check, and immunizations.  Will obtain reccord from childhood.  Thyroid: Feels much better after decreasing he synthroid dose from to 75 micrograms daily.  Her hairloss has stopped and she is sleeping better.    Mood: Much better today.  She scored a 4 on the PHQ-9 scale.  She is tolerating her zoloft 100mg  daily well.  She denies any tinitus that she has experiecned with other SSRIs. She understands that she will likely require antidepression medications for the rest of her life.    Headache: Ms Towle has headaches around her mensturation. She has been taking propranolol 40mg  daily the week before her period which helps with her migranes. He BP does not decrease with the propranolol and she does not feel bad when taking this medication.  She asks if doing this is OK.  She is OK with how well her migranes are controlled now.   Current Medications (verified): 1)  Hydrochlorothiazide 25 Mg Tabs (Hydrochlorothiazide) .... Take 1 Pill By Mouth Daily 2)  Sprintec 28 0.25-35 Mg-Mcg Tabs (Norgestimate-Eth Estradiol) 3)  Daily Vitamins For Women  Tabs (Multiple Vitamins-Calcium) 4)   Synthroid 75 Mcg Tabs (Levothyroxine Sodium) .Marland Kitchen.. 1 By Mouth Daily 5)  Claritin 10 Mg Tabs (Loratadine) .Marland Kitchen.. 1 By Mouth Daily 6)  Zoloft 100 Mg Tabs (Sertraline Hcl) .Marland Kitchen.. 1 By Mouth Daily 7)  Propranolol Hcl 40 Mg Tabs (Propranolol Hcl) .Marland Kitchen.. 1 Daily 1 Week Prior To Period To Prevent Migranes.  Allergies (verified): 1)  ! Codeine 2)  ! Erythromycin  Past History:  Past Surgical History: Last updated: 03/28/2007 Appendectomy Tubal ligation Tonsillectomy Inguinal hernia repair Rt foot fracture-06/2005 Lt rptator cuff repair  Family History: Last updated: 03/28/2007 Family History Hypertension Family History Other cancer-prostate,breast  Social History: Last updated: 09/09/2009 moved from [place] Divorced lives w/ mom and son Caryn Bee) Is a full time student studying health care administration.  Risk Factors: Smoking Status: never (01/12/2010) Passive Smoke Exposure: no (04/07/2007)  Past Medical History: Depression Headache, migraines DX years ago with mensturation. Take propranolol 1 week prior to her period for prophylaxis.  Ex premi with multiple cases of bronchitis in the past  Review of Systems       The patient complains of weight loss.  The patient denies anorexia, fever, weight gain, vision loss, decreased hearing, hoarseness, chest pain, syncope, dyspnea on exertion, peripheral edema, prolonged cough, headaches, hemoptysis, abdominal pain, melena, hematochezia, severe indigestion/heartburn, hematuria, incontinence, genital sores, muscle  weakness, suspicious skin lesions, transient blindness, difficulty walking, depression, unusual weight change, abnormal bleeding, enlarged lymph nodes, angioedema, and breast masses.         Weight loss is intentional.  Physical Exam  General:  VS noted. Well woman in NAD Mouth:  MMM.   . Lungs:  normal respiratory effort and normal breath sounds.   Heart:  normal rate and regular rhythm.   Abdomen:  Non distended, NABS, soft,  nontender, no guarding or rebound.   Extremities:  Non-edemetus Psych:  Cognition and judgment appear intact. Alert and cooperative with normal attention span and concentration. 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 (PHQ-9 = 4) Plan to continue zoloft 100 and no changes.   Will follow PHQ-9s for objective measures.   Orders: [facility]- Est  Level 4 ([address])  Problem # 2:  UNSPECIFIED HYPOTHYROIDISM (ICD-244.9) Assessment: Improved Subjectivaly better in 1 month. Will follow up TSH in 1 month for a lab visit.   Her updated medication list for this problem includes:    Synthroid 75 Mcg Tabs (Levothyroxine sodium) .Marland Kitchen... 1 by mouth daily  Orders: [facility]- Est  Level 4 ([address])Future Orders: TSH-[facility] ([id number]) ... 03/22/2011  Problem # 3:  MIGRAINE, CHRONIC (ICD-346.90) Assessment: Improved  Propranolol as below.  Will change if her BP drops.   Her updated medication list for this problem includes:    Propranolol Hcl 40 Mg Tabs (Propranolol hcl) .Marland Kitchen... 1 daily 1 week prior to period to prevent migranes.  Orders: [facility]- Est  Level 4 ([address])  Complete Medication List: 1)  Hydrochlorothiazide 25 Mg Tabs (Hydrochlorothiazide) .... Take 1 pill by mouth daily 2)  Sprintec 28 0.25-35 Mg-mcg Tabs (Norgestimate-eth estradiol) 3)  Daily Vitamins For Women Tabs (Multiple vitamins-calcium) 4)  Synthroid 75 Mcg Tabs (Levothyroxine sodium) .Marland Kitchen.. 1 by mouth daily 5)  Claritin 10 Mg Tabs (Loratadine) .Marland Kitchen.. 1 by mouth daily 6)  Zoloft 100 Mg Tabs (Sertraline hcl) .Marland Kitchen.. 1 by mouth daily 7)  Propranolol Hcl 40 Mg Tabs (Propranolol hcl) .Marland Kitchen.. 1 daily 1 week prior to period to prevent migranes.  Other Orders: Vision- [facility] [id number]) Miscellaneous Lab Charge-[facility] [id number]) Flu Vaccine & Administration [id number]) TB Skin Test [id number]) Tdap => 50yrs IM  ([address])  Patient Instructions: 1)  Thank you for seeing me today. 2)  Try claratin 10 mg daily. If it dosent work better let me know. 3)  Call the [facility] health dept for you immunization reccords. 4)  Please come back in 1 month for yout thryroid labs. 5)  Come back Monday for your PPD. 6)  Call me if you get sick.  7)  Make an appointment with me for 6 months.  8)  Conrats on weight loss and your blood pressure and your mood. Prescriptions: PROPRANOLOL HCL 40 MG TABS (PROPRANOLOL HCL) 1 daily 1 week prior to period to prevent migranes.  #30 x 5   Entered and Authorized by:   Clementeen Graham MD   Signed by:   Clementeen Graham MD on 02/26/2010   Method used:   Electronically to        [facility] (retail)       [address]       [place], [place]  [address]       Ph: [phone]       Fax: [phone]  RxID:   [id number] CLARITIN 10 MG TABS (LORATADINE) 1 by mouth daily  #30 x 12   Entered by:   Loralee Pacas CMA   Authorized by:   Clementeen Graham MD   Signed by:   Loralee Pacas CMA on 02/23/2010   Method used:   Electronically to        [facility] (retail)       [address]       [place], [place]  [address]       Ph: [phone]       Fax: [phone]   RxID:   [id number]    Prevention & Chronic Care Immunizations   Influenza vaccine: Not documented   Influenza vaccine deferral: Not available  (02/23/2010)    Tetanus booster: Not documented   Td booster deferral: Deferred  (09/09/2009)    Pneumococcal vaccine: Not documented  Other Screening   Pap smear: normal  (09/02/2009)   Pap smear due: 09/02/2010    Mammogram: BI-RADS CATEGORY 2:  Benign finding(s).^MM DIGITAL DIAGNOSTIC UNILAT R  (08/12/2008)   Mammogram action/deferral: Ordered  (09/09/2009)   Mammogram due: 08/12/2009   Smoking status: never  (01/12/2010)  Lipids   Total Cholesterol: Not documented   Lipid panel  action/deferral: Deferred   LDL: Not documented   LDL Direct: Not documented   HDL: Not documented   Triglycerides: Not documented  Hypertension   Last Blood Pressure: 124 / 83  (02/23/2010)   Serum creatinine: 0.84  (09/09/2009)   Serum potassium 4.1  (09/09/2009)    Hypertension flowsheet reviewed?: Yes   Progress toward BP goal: At goal  Self-Management Support :   Personal Goals (by the next clinic visit) :      Personal blood pressure goal: 140/90  (09/09/2009)   Patient will work on the following items until the next clinic visit to reach self-care goals:     Medications and monitoring: take my medicines every day, check my blood pressure, weigh myself weekly  (02/23/2010)     Activity: take a 30 minute walk every day  (09/09/2009)    Hypertension self-management support: BP self-monitoring log  (09/09/2009)

## 2010-11-21 NOTE — Assessment & Plan Note (Signed)
Summary: FU/KH   Vital Signs:  Patient profile:   50 year old female Weight:      183.6 pounds Temp:     98 degrees F oral Pulse rate:   96 / minute BP sitting:   128 / 86  (right arm)  Vitals Entered By: Arlyss Repress CMA, (November 08, 2009 2:52 PM) CC: cold symptoms x 10 days. cough some productive. f/up labs. Is Patient Diabetic? No Pain Assessment Patient in pain? no        Primary Care Provider:  Clementeen Graham MD  CC:  cold symptoms x 10 days. cough some productive. f/up labs..  History of Present Illness: Ms Herrada presents for f/u of her depression and BP  BP: Feels well no palpatations, chest pain, or dyspnea.  Depression: Feeling somewhat better.  Still having problems sleeping and has some increased anxiety initially which has now resolved.  Still does not feel back to normal yet.  No SI/HI.   URI: Ms Bocanegra has a new URI symptoms.  She has had 11 days of cold symtoms includiing cough and nasal congestion.  She has notied that her cough became increasingly productive in the past few days.  No fever, chills or dyspnea.  Habits & Providers  Alcohol-Tobacco-Diet     Tobacco Status: never  Current Problems (verified): 1)  Major Dprsv Disorder Recurrent Episode Moderate  (ICD-296.32) 2)  Migraine, Chronic  (ICD-346.90) 3)  Neck Pain  (ICD-723.1) 4)  Microscopic Hematuria  (ICD-599.72) 5)  Back Pain  (ICD-724.5) 6)  Unspecified Hypothyroidism  (ICD-244.9) 7)  Migraine Headache  (ICD-346.90) 8)  Depression, Hx of  (ICD-V11.8) 9)  Essential Hypertension, Benign  (ICD-401.1)  Current Medications (verified): 1)  Hydrochlorothiazide 25 Mg Tabs (Hydrochlorothiazide) .... Take 1 Pill By Mouth Daily 2)  Sprintec 28 0.25-35 Mg-Mcg Tabs (Norgestimate-Eth Estradiol) 3)  Daily Vitamins For Women  Tabs (Multiple Vitamins-Calcium) 4)  Synthroid 100 Mcg Tabs (Levothyroxine Sodium) .Marland Kitchen.. 1 Tab By Mouth Daily 5)  Celexa 20 Mg Tabs (Citalopram Hydrobromide) .... Take 1 Tablet  Daily 6)  Zithromax 500 Mg Tabs (Azithromycin) .Marland Kitchen.. 1 Pill By Mouth X 3 Days  Allergies (verified): 1)  ! Codeine 2)  ! Erythromycin  Past History:  Past Surgical History: Last updated: 03/28/2007 Appendectomy Tubal ligation Tonsillectomy Inguinal hernia repair Rt foot fracture-06/2005 Lt rptator cuff repair  Social History: Last updated: 09/09/2009 moved from [place] Divorced lives w/ mom and son Caryn Bee) Is a full time student studying health care administration.  Risk Factors: Smoking Status: never (11/08/2009) Passive Smoke Exposure: no (04/07/2007)  Past Medical History: Depression Headache, migraines DX years ago with mensturation Ex premi with multiple cases of bronchitis in the past  Review of Systems       ROS neg w/ excpetion of some hairloss.  Please see HPI.  Physical Exam  General:  VS noted and rechecked. Well appearing in NAD Head:  Non-tender over nasal sinus. Eyes:  Grossly intact. Nose:  Clear rhinorhea. Mouth:  MMM Lungs:  normal respiratory effort and normal breath sounds.   Heart:  normal rate and regular rhythm.   Abdomen:  Non distended, NABS, soft, nontender, no guarding or rebound.   Extremities:  Non-edemetus Psych:  Oriented X3, memory intact for recent and remote, normally interactive, good eye contact, not anxious appearing, not depressed appearing, not agitated, not suicidal, and not homicidal.     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  Could be improved. Plan to f/u with patient in a few weeks and adjust the dose if needed.  Pt may require 40mg  once daily of Celexa.  No SI/HI.  Precautions given.  Orders: [facility]- Est  Level 4 ([address])  Problem # 2:  UNSPECIFIED HYPOTHYROIDISM (ICD-244.9)  No definitive signs or symptoms of hypo or hyper thyroidism.  Plan to keep dose at daily and recheck level in 6  weeks. Will check earlier if pt develops signs of hyperthroidism.  Her updated medication list for this problem includes:    Synthroid 100 Mcg Tabs (Levothyroxine sodium) .Marland Kitchen... 1 tab by mouth daily  Orders: TSH-[facility] [phone]) Free T4-[facility] [id number])  Labs Reviewed: TSH: 14.707 (09/02/2009)   --> 0.341 11/09/09  Problem # 3:  ESSENTIAL HYPERTENSION, BENIGN (ICD-401.1)  Well controlled. No changes planned. Her updated medication list for this problem includes:    Hydrochlorothiazide 25 Mg Tabs (Hydrochlorothiazide) .Marland Kitchen... Take 1 pill by mouth daily  BP today: 128/86 Prior BP: 134/81 (10/12/2009)  Labs Reviewed: K+: 4.1 (09/09/2009) Creat: : 0.84 (09/09/2009)     Orders: [facility]- Est  Level 4 ([address])  Problem # 4:  ACUTE BRONCHITIS (ICD-466.0)  Plan to provide ABX given 11 days of URI symptoms and new development of spitum, and hx of bronchitis.  Will f/u as needed.  Precautions given. Her updated medication list for this problem includes:    Zithromax 500 Mg Tabs (Azithromycin) .Marland Kitchen... 1 pill by mouth x 3 days  Take antibiotics and other medications as directed. Encouraged to push clear liquids, get enough rest, and take acetaminophen as needed. To be seen in 5-7 days if no improvement, sooner if worse.  Orders: [facility]- Est  Level 4 ([address])  Complete Medication List: 1)  Hydrochlorothiazide 25 Mg Tabs (Hydrochlorothiazide) .... Take 1 pill by mouth daily 2)  Sprintec 28 0.25-35 Mg-mcg Tabs (Norgestimate-eth estradiol) 3)  Daily Vitamins For Women Tabs (Multiple vitamins-calcium) 4)  Synthroid 100 Mcg Tabs (Levothyroxine sodium) .Marland Kitchen.. 1 tab by mouth daily 5)  Celexa 20 Mg Tabs (Citalopram hydrobromide) .... Take 1 tablet daily 6)  Zithromax 500 Mg Tabs (Azithromycin) .Marland Kitchen.. 1 pill by mouth x 3 days  Patient Instructions: 1)  Thank you for seeing me today. 2)  Please schedule a follow-up appointment in 2 months.  3)  If you feel like hurting yourself or others please seek  medical attention.  4)  I will call you with the results of your tests.  Prescriptions: ZITHROMAX 500 MG TABS (AZITHROMYCIN) 1 pill by mouth x 3 days  #3 x 0   Entered and Authorized by:   Clementeen Graham MD   Signed by:   Clementeen Graham MD on 11/08/2009   Method used:   Electronically to        [facility] (retail)       [address]       [place], [place]  [address]       Ph: [phone]       Fax: [phone]   RxID:   [id number]    Prevention & Chronic Care Immunizations   Influenza vaccine: Not documented   Influenza vaccine deferral: Deferred  (09/09/2009)    Tetanus booster: Not documented   Td booster deferral: Deferred  (09/09/2009)    Pneumococcal vaccine: Not documented  Other Screening   Pap smear: normal  (09/02/2009)   Pap smear due: 09/02/2010  Mammogram: BI-RADS CATEGORY 2:  Benign finding(s).^MM DIGITAL DIAGNOSTIC UNILAT R  (08/12/2008)   Mammogram action/deferral: Ordered  (09/09/2009)   Mammogram due: 08/12/2009   Smoking status: never  (11/08/2009)  Lipids   Total Cholesterol: Not documented   Lipid panel action/deferral: Deferred   LDL: Not documented   LDL Direct: Not documented   HDL: Not documented   Triglycerides: Not documented  Hypertension   Last Blood Pressure: 128 / 86  (11/08/2009)   Serum creatinine: 0.84  (09/09/2009)   Serum potassium 4.1  (09/09/2009)    Hypertension flowsheet reviewed?: Yes   Progress toward BP goal: At goal  Self-Management Support :   Personal Goals (by the next clinic visit) :      Personal blood pressure goal: 140/90  (09/09/2009)   Patient will work on the following items until the next clinic visit to reach self-care goals:     Medications and monitoring: take my medicines every day, check my blood pressure, bring all of my medications to every visit  (11/08/2009)     Activity: take a 30 minute walk every day  (09/09/2009)    Hypertension self-management  support: BP self-monitoring log  (09/09/2009)

## 2010-11-21 NOTE — Assessment & Plan Note (Signed)
Summary: MMR for school,tcb  Nurse Visit MMR given . entered in [place] gave private vaccine , should have been state provided vaccine. 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 will replace a state vaccine to private.Theresia Lo RN  May 12, 2010 8:56 AM   Vital Signs:  Patient profile:   50 year old female Temp:     97.9 degrees F oral  Vitals Entered By: Theresia Lo RN (May 12, 2010 9:03 AM)  Allergies: 1)  ! Codeine 2)  ! Erythromycin  Orders Added: 1)  Admin 1st Vaccine [facility]) [phone]   Vital Signs:  Patient profile:   50 year old female Temp:     97.9 degrees F oral  Vitals Entered By: Theresia Lo RN (May 12, 2010 9:03 AM)

## 2010-11-21 NOTE — Progress Notes (Signed)
Summary: results  Phone Note Call from Patient Call back at Home Phone [phone]   Caller: Patient Summary of Call: wants lab results Initial call taken by: De Nurse,  Feb 27, 2010 3:30 PM  Follow-up for Phone Call        told her it was positive. she has had the series more than once & does not want to get anymore. used to work as a Tax inspector Follow-up by: Golden Circle RN,  Feb 27, 2010 3:35 PM

## 2010-11-21 NOTE — Letter (Signed)
Summary: Generic Letter  [facility]  [address]   [place], [place] [address]   Phone: [phone]  Fax: [phone]    03/13/2010  Sumer Akhavan [address] [place], [place]  [address]  Patient was given Tetanus immunization and PPD Test today. We are out of Flu vaccinations.    Sincerely,   Arlyss Repress CMA,

## 2010-11-21 NOTE — Assessment & Plan Note (Signed)
Summary: nutri per corey/kh   Vital Signs:  Patient profile:   50 year old female Height:      66 inches Weight:      182.3 pounds BMI:     29.53  Vitals Entered By: Wyona Almas PHD (January 24, 2010 3:55 PM)  Primary Care Provider:  Clementeen Graham MD   History of Present Illness: Assessment:  Spent more than 60 min w/ pt.  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  Usual eating pattern includes an apple for bkfst, 12 oz Coke thru the AM, water for lunch, and dinner of veg's, rice, and chx, and occasional snacks.  Favorites include pizza, starchy fds, & chocolate.  Won't eat some meats, br sprouts, lima beans, bananas, yogurt, or oatmeal.  Lexxie walks her dog  ~10 min 1-2 X day,  ~a month ago she started making an effort to use stairs vs. elevator, park further away, etc., and she plays 30-60 min XBox game 2-3 X w/ her son.  24-hr recall was atypical in that it was a bit less than usual, but Caiden said not by much: B (6:25 AM)- 1 apple, water, 12 oz Coke; D (5:30 PM)- [employer] (got sick and threw up), water.    Nutrition Diagnosis:  Physical inactivity (NB-2.1) related to time constraints (full-time std status and part-time employmt) as evidenced by limited aerobic activity and no strength training.  Disordered eating pattern (NB-1.5) related to no appetite and time constraints as evidenced by reported typical intake of <1,000 kcal.    Intervention:  See Patient Instructions.    Monitoring/Eval:  Dietary intake, body weight, and exercise at 3-wk F/U.     Allergies: 1)  ! Codeine 2)  ! Erythromycin   Complete Medication List: 1)  Hydrochlorothiazide 25 Mg Tabs (Hydrochlorothiazide) .... Take 1 pill by mouth daily 2)  Sprintec 28 0.25-35 Mg-mcg Tabs (Norgestimate-eth estradiol) 3)  Daily Vitamins For Women Tabs (Multiple vitamins-calcium) 4)  Synthroid 75  Mcg Tabs (Levothyroxine sodium) .Marland Kitchen.. 1 by mouth daily 5)  Zithromax 500 Mg Tabs (Azithromycin) .Marland Kitchen.. 1 pill by mouth x 3 days 6)  Tessalon Perles 100 Mg Caps (Benzonatate) .Marland Kitchen.. 1-2 by mouth q 8 hrs as needed cough 7)  Zyrtec Allergy 10 Mg Caps (Cetirizine hcl) 8)  Zoloft 100 Mg Tabs (Sertraline hcl) .Marland Kitchen.. 1 by mouth daily  Other Orders: Inital Assessment Each - [facility] [phone])  Patient Instructions: 1)  Eat at least 3 meals and 1-2 snacks per day.  2)  Vegetables:  Keep frozen veg's on hand; use microwavefor veg's. 3)  Include VEG'S and a PROTEIN source at both lunch and dinner.   4)  Experiment with beans, i.e., mix pintos with low-fat salad dressing, garlic, cucumber, and tomatoes.   5)  Plant sources of protein:  Lentils or split peas are very quick to prepare.  Delford Field is a high-protein grain.   6)  When you choose carbohydrates, look for fiber.   7)  PLAN AHEAD FOR MEALS AND SNACKS.  No more than 5 hours between eating.  8)  Snacks:  string cheese, fresh fruit or fruit cups, apple sauce, cottage cheese, cut veg's.  9)  Pay attention to your energy levels as you make these dietary changes.   10)  Remember what you did before when successful at wt loss:  small, frequent meals  and lots of veg's.   11)  Bedtime Routine: Try to be consistent as you wind down for bed.   12)  Schedule for a 3-4-week follow-up.

## 2010-11-21 NOTE — Progress Notes (Signed)
Summary: re: labs/ts  ---- Converted from flag ---- ---- 03/24/2010 10:10 AM, Milinda Antis MD wrote: Please call pt and tell her , labs were normal. Her kidney function was normal, her potassium was normal, her glucose was normal at 88. This was a negative screen for diabetes ------------------------------  called pt. informed.

## 2010-11-21 NOTE — Assessment & Plan Note (Signed)
Summary: uti per pt/Gina Wise   Vital Signs:  Patient profile:   50 year old female Height:      66 inches Weight:      183 pounds BMI:     29.64 Temp:     98.2 degrees F oral Pulse rate:   72 / minute BP sitting:   110 / 76  (left arm) Cuff size:   regular  Vitals Entered By: Tessie Fass CMA (March 23, 2010 3:17 PM) CC: dysuria, urgency Is Patient Diabetic? No Pain Assessment Patient in pain? yes     Location: lower back Intensity: 5   Primary Care Provider:  Clementeen Graham MD  CC:  dysuria and urgency.  History of Present Illness:  Pt treated for UTI in April 2011 at [facility] ED with bactrim . For past 3-4 days has had dysuria and urgency, painful urination. Denies vaginal discharge or bleeding, abdominal pain. Denies fever, emesis. Admits to increased thrist and drinking plenty of fluids. Denies diarrhea, admits to small caliber stools  URI- coughing with itch in her throat for past few days. Has not picked up Claritin yet. Cough non productive, no difficulty breathing. +feeling of fullness in ears  Habits & Providers  Alcohol-Tobacco-Diet     Tobacco Status: never  Allergies: 1)  ! Codeine 2)  ! Erythromycin  Physical Exam  General:  VS noted. Well woman in NAD Eyes:  no conjunctival injection Ears:  TM clear bilat,no fluid noted Nose:  no nasal discharge.   Mouth:  pharynx pink and moist.   Neck:  supple.   Lungs:  normal respiratory effort and normal breath sounds.   Heart:  normal rate and regular rhythm.   Abdomen:  soft, non-tender, and no distention.   No CVA tenderness no HSM   Impression & Recommendations:  Problem # 1:  UTI (ICD-599.0) Assessment New  Will treat based on symptoms high probability prior to UA results, unable to culture secondary to multiple Epithelial cells. Check BMET in setting HTN, reccurent infection and frequency with thirst- creating and random BS for possible diabetes Her updated medication list for this problem  includes:    Keflex 500 Mg Caps (Cephalexin) .Marland Kitchen... 1 by mouth two times a day x 3 days  Orders: [facility]- Est Level  3 (27035)  Problem # 2:  URI (ICD-465.9) Assessment: New  Supprotive care Her updated medication list for this problem includes:    Claritin 10 Mg Tabs (Loratadine) .Marland Kitchen... 1 by mouth daily  Orders: [facility]- Est Level  3 (00938)  Complete Medication List: 1)  Hydrochlorothiazide 25 Mg Tabs (Hydrochlorothiazide) .... Take 1 pill by mouth daily 2)  Sprintec 28 0.25-35 Mg-mcg Tabs (Norgestimate-eth estradiol) 3)  Daily Vitamins For Women Tabs (Multiple vitamins-calcium) 4)  Synthroid 88 Mcg Tabs (Levothyroxine sodium) .Marland Kitchen.. 1 by mouth daily 5)  Claritin 10 Mg Tabs (Loratadine) .Marland Kitchen.. 1 by mouth daily 6)  Zoloft 100 Mg Tabs (Sertraline hcl) .Marland Kitchen.. 1 by mouth daily 7)  Propranolol Hcl 40 Mg Tabs (Propranolol hcl) .Marland Kitchen.. 1 daily 1 week prior to period to prevent migranes. 8)  Keflex 500 Mg Caps (Cephalexin) .Marland Kitchen.. 1 by mouth two times a day x 3 days  Other Orders: Urinalysis-[facility] (00000) Basic Met-[facility] ([address]99-37169)  Patient Instructions: 1)  I am treating you for a Urine infection 2)  Take the antibiotics for 3 days 3)  I will also check your kidney function and your blood glucose as a screening for diabetes 4)  Pick up the  loratadine 5)  Robitussin DM for cough as needed Prescriptions: KEFLEX 500 MG CAPS (CEPHALEXIN) 1 by mouth two times a day x 3 days  #6 x 0   Entered and Authorized by:   Milinda Antis MD   Signed by:   Milinda Antis MD on 03/23/2010   Method used:   Electronically to        [facility] (retail)       [address]       [place], [place]  [address]       Ph: [phone]       Fax: [phone]   RxID:   [id number]   Laboratory Results   Urine Tests  Date/Time Received: March 23, 2010 3:50 PM  Date/Time Reported: March 23, 2010 4:11 PM   Routine Urinalysis   Color: yellow Appearance: Clear Glucose:  negative   (Normal Range: Negative) Bilirubin: negative   (Normal Range: Negative) Ketone: negative   (Normal Range: Negative) Spec. Gravity: 1.015   (Normal Range: 1.003-1.035) Blood: small   (Normal Range: Negative) pH: 7.0   (Normal Range: 5.0-8.0) Protein: negative   (Normal Range: Negative) Urobilinogen: 1.0   (Normal Range: 0-1) Nitrite: negative   (Normal Range: Negative) Leukocyte Esterace: trace   (Normal Range: Negative)  Urine Microscopic WBC/HPF: occ RBC/HPF: 0-3 Bacteria/HPF: 2+ rods and cocci Epithelial/HPF: 15-25    Comments: ...............test performed by......Marland KitchenBonnie A. [place], MLS (ASCP)cm

## 2010-11-21 NOTE — Progress Notes (Signed)
Summary: Rx Prob  Phone Note Call from Patient Call back at Home Phone [phone]   Caller: Patient Summary of Call: Pt taking Prozac but it is also making ears ring and she is going to stop taking it.  Would like to try Zoloft.  Can you please call to discuss. Initial call taken by: Clydell Hakim,  December 22, 2009 1:43 PM  Follow-up for Phone Call        spoke with patient and advised that message will be sent to Dr. Denyse Amass. she had same problem with celexa. Follow-up by: Theresia Lo RN,  December 22, 2009 3:01 PM  Additional Follow-up for Phone Call Additional follow up Details #1::        Switched patient to Zoloft.  Will d/c prozac for three days then start zoloft 50.  Will f/u in 2 weeks    New/Updated Medications: ZOLOFT 50 MG TABS (SERTRALINE HCL) Take 1 by mouth daily 3 days after stoping prozac Prescriptions: ZOLOFT 50 MG TABS (SERTRALINE HCL) Take 1 by mouth daily 3 days after stoping prozac  #30 x 12   Entered and Authorized by:   Clementeen Graham MD   Signed by:   Clementeen Graham MD on 12/23/2009   Method used:   Electronically to        [facility] (retail)       [address]       [place], [place]  [address]       Ph: [phone]       Fax: [phone]   RxID:   [id number]

## 2010-11-21 NOTE — Assessment & Plan Note (Signed)
Summary: read ppd/kh  Nurse Visit   Allergies: 1)  ! Codeine 2)  ! Erythromycin  PPD Results    Date of reading: 03/30/2010    Results: 0 mm    Interpretation: negative  Orders Added: 1)  No Charge Patient Arrived (NCPA0) [NCPA0]

## 2010-11-21 NOTE — Assessment & Plan Note (Signed)
Summary: tb test & tetnus/eo  Nurse Visit   Allergies: 1)  ! Codeine 2)  ! Erythromycin  Immunizations Administered:  Tetanus Vaccine:    Vaccine Type: Tdap    Site: left deltoid    Mfr: GlaxoSmithKline    Dose: 0.5 ml    Route: IM    Given by: Arlyss Repress CMA,    Exp. Date: 01/14/2012    Lot #: [id number]    VIS given: 09/09/07 version given Mar 13, 2010.  PPD Skin Test:    Vaccine Type: PPD    Site: left forearm    Mfr: Sanofi Pasteur    Dose: 0.1 ml    Route: ID    Given by: Arlyss Repress CMA,    Exp. Date: 08/03/2012    Lot #: [id number]  Orders Added: 1)  Tdap => 9yrs IM [90715] 2)  Admin 1st Vaccine [90471] 3)  TB Skin Test [86580] 4)  Admin of Any Addtl Vaccine [90472] 5)  Est Level 1- [facility] [45409]

## 2010-11-21 NOTE — Assessment & Plan Note (Signed)
Summary: TB TEST & TO SEE DR SYKES/[facility]  Nurse Visit  patient's school,  [employer] requires a 2 step PPD. Theresia Lo RN  March 27, 2010 5:27 PM   Allergies: 1)  ! Codeine 2)  ! Erythromycin  Immunizations Administered:  PPD Skin Test:    Vaccine Type: PPD    Site: right forearm    Mfr: Sanofi Pasteur    Dose: 0.1 ml    Route: ID    Given by: Theresia Lo RN    Exp. Date: 08/04/2011    Lot #: [id number]  Orders Added: 1)  TB Skin Test [86580] 2)  Admin 1st Vaccine [phone]

## 2010-11-21 NOTE — Assessment & Plan Note (Signed)
Summary: 2nd mmr,df  Nurse Visit  MMR # 2 given. used state vaccine because patient is going to a techicial school for ther first time. Entered in [place]. Theresia Lo RN  June 23, 2010 9:01 AM   Vital Signs:  Patient profile:   50 year old female Temp:     98.3 degrees F  Vitals Entered By: Theresia Lo RN (June 23, 2010 9:00 AM)  Allergies: 1)  ! Codeine 2)  ! Erythromycin  Orders Added: 1)  Admin 1st Vaccine [facility]) [phone]

## 2010-11-23 NOTE — Miscellaneous (Signed)
  Clinical Lists Changes  Problems: Removed problem of NEED PROPH VACC W/MEASLES-MUMPS-RUBELLA VACCINE (ICD-V06.4) Removed problem of NEED PROPH VACC W/MEASLES-MUMPS-RUBELLA VACCINE (ICD-V06.4) Removed problem of SCREENING EXAMINATION FOR PULMONARY TUBERCULOSIS (ICD-V74.1) Removed problem of UTI (ICD-599.0) Removed problem of URI (ICD-465.9) Removed problem of DYSURIA (ICD-788.1) Removed problem of HEALTH SCREENING (ICD-V70.0) Removed problem of BACK PAIN (ICD-724.5) Removed problem of NECK PAIN (ICD-723.1)

## 2011-01-03 ENCOUNTER — Ambulatory Visit (INDEPENDENT_AMBULATORY_CARE_PROVIDER_SITE_OTHER): Payer: BC Managed Care – PPO | Admitting: Obstetrics & Gynecology

## 2011-01-03 ENCOUNTER — Ambulatory Visit (INDEPENDENT_AMBULATORY_CARE_PROVIDER_SITE_OTHER): Payer: BC Managed Care – PPO | Admitting: Family Medicine

## 2011-01-03 ENCOUNTER — Encounter: Payer: Self-pay | Admitting: Physician Assistant

## 2011-01-03 ENCOUNTER — Other Ambulatory Visit: Payer: Self-pay | Admitting: Physician Assistant

## 2011-01-03 ENCOUNTER — Encounter: Payer: Self-pay | Admitting: Family Medicine

## 2011-01-03 VITALS — BP 137/88 | HR 78 | Temp 98.6°F | Ht 66.0 in | Wt 186.4 lb

## 2011-01-03 DIAGNOSIS — E663 Overweight: Secondary | ICD-10-CM

## 2011-01-03 DIAGNOSIS — F331 Major depressive disorder, recurrent, moderate: Secondary | ICD-10-CM

## 2011-01-03 DIAGNOSIS — I10 Essential (primary) hypertension: Secondary | ICD-10-CM

## 2011-01-03 DIAGNOSIS — N938 Other specified abnormal uterine and vaginal bleeding: Secondary | ICD-10-CM

## 2011-01-03 DIAGNOSIS — E039 Hypothyroidism, unspecified: Secondary | ICD-10-CM

## 2011-01-03 DIAGNOSIS — N899 Noninflammatory disorder of vagina, unspecified: Secondary | ICD-10-CM

## 2011-01-03 DIAGNOSIS — E669 Obesity, unspecified: Secondary | ICD-10-CM | POA: Insufficient documentation

## 2011-01-03 DIAGNOSIS — G43909 Migraine, unspecified, not intractable, without status migrainosus: Secondary | ICD-10-CM

## 2011-01-03 DIAGNOSIS — N949 Unspecified condition associated with female genital organs and menstrual cycle: Secondary | ICD-10-CM

## 2011-01-03 DIAGNOSIS — N951 Menopausal and female climacteric states: Secondary | ICD-10-CM

## 2011-01-03 LAB — CONVERTED CEMR LAB
LH: 5.1 milliintl units/mL
TSH: 1.991 microintl units/mL (ref 0.350–4.500)

## 2011-01-03 NOTE — Assessment & Plan Note (Signed)
Doing OK.  Discussed lower carb diet and gave lots of encouragement. Will follow as needed. Good job Technical brewer. See pt instructions.

## 2011-01-03 NOTE — Assessment & Plan Note (Signed)
Stable on zoloft 100 qd. PHQ9 is 0. Pt has relapsed in the past. Will not stop medications. Will follow as needed.  Congrats to Gina Wise for getting through this.

## 2011-01-03 NOTE — Assessment & Plan Note (Signed)
Stable no migraines recently. Pt stoped propranolol and birth control pills. Feels well.

## 2011-01-03 NOTE — Assessment & Plan Note (Signed)
Stable on HCTZ. No changes planned.

## 2011-01-03 NOTE — Patient Instructions (Signed)
Thank you for coming in today. Good luck with the zipline. Keep taking the zoloft.  I will call you about your thyroid.  Eat fewer carbs and more steamed green vegies.  For fruit avoid pineapple, or bannas. Apples are great! Protein is fine especially chicken and fish.    Look for about 1 lb of weight loss a week.  Try reading New [place] diet or  Enter the Zone diet.

## 2011-01-03 NOTE — Assessment & Plan Note (Signed)
Plan to check TSH today and adjust does as needed. Will follow.

## 2011-01-03 NOTE — Progress Notes (Signed)
Gina Wise is doing great. 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 She has health insurance now.   1) Thyroid: Feels well. Has been trying to lose weight and has found it to be difficult. She wonders if her thyroid level is too low. Not too hot or cold. No neck mass.   2) Mood: doing great. Takes zoloft 100. PHQ9 today is 0. No SI/HI. Does not want to stop zoloft.   3) Weight: Started exercise program 1 month ago. Works out 4 times a week 1.5 hours at a time. Feels well. Is frustrated that she has only lost 2 pounds in a month. Eats around 1000 calories a day. Lots of fruit and veggies. Avoids fat.   4) Throat: Has occasional feeling of food in her throat. No heartburn or reflux. Food does not get stuck. Not getting worse.   5) Migraines. None recently. Stopped OCP and Propranolol. Having less headaches. Feels well.   6) BP: Stable on HCTZ. No palpitations or chest pain.   ROS: Please see HPI. Feels well otherwise.   Exam:  Vs noted.  Gen: Well NAD HEENT: EOMI, PERRL, MMM. No thyroid mass.  Lungs: CTABL Nl WOB Heart: RRR no MRG Abd: NABS, NT, ND Exts: Non edematous BL  LE Psych: Happy. Looks great.

## 2011-01-04 ENCOUNTER — Encounter: Payer: Self-pay | Admitting: *Deleted

## 2011-01-04 LAB — CONVERTED CEMR LAB
Clue Cells Wet Prep HPF POC: NONE SEEN
Trich, Wet Prep: NONE SEEN
Yeast Wet Prep HPF POC: NONE SEEN

## 2011-01-09 LAB — URINE MICROSCOPIC-ADD ON

## 2011-01-09 LAB — BASIC METABOLIC PANEL
BUN: 12 mg/dL (ref 6–23)
CO2: 19 mEq/L (ref 19–32)
Chloride: 105 mEq/L (ref 96–112)
Creatinine, Ser: 0.8 mg/dL (ref 0.4–1.2)
Glucose, Bld: 125 mg/dL — ABNORMAL HIGH (ref 70–99)

## 2011-01-09 LAB — URINALYSIS, ROUTINE W REFLEX MICROSCOPIC
Glucose, UA: NEGATIVE mg/dL
Specific Gravity, Urine: 1.026 (ref 1.005–1.030)
pH: 5 (ref 5.0–8.0)

## 2011-01-12 NOTE — Progress Notes (Signed)
NAMECAYCE, Gina Wise                 ACCOUNT NO.:  [id number]  MEDICAL RECORD NO.:  [id number]           PATIENT TYPE:  A  LOCATION:  [facility]                   FACILITY:  [facility]  PHYSICIAN:  Maylon Cos, CNM    DATE OF BIRTH:  01-12-1961  DATE OF SERVICE:                                 CLINIC NOTE  HISTORY OF PRESENT ILLNESS:  Younce is a 50 year old G2, P2, presenting here for her annual exam.  Also concern for questionable monthly yeast infections and urinary incontinence.  She has no history of abnormal Pap smears and has been getting them yearly.  The last we have on record is November 2010.  She has noticed her questionable yeast infections one the time a month, 1 week before her period.  She is currently feeling edgy today.  She has noticed no vaginal discharge or foul odor, but these are relieved with her Monistat and with her menses and they started in January of this year once she stopped taking her birth control pills, so she believes that they are yeast infection.  She has never been tested for these questionable yeast infections but has had them in the past and they are similar.  She has never been tested for a type 2 diabetes, but has gained weight recently and thinks those might be related to that as well.  The patient also presents with monthly periods that have increased in bleeding levels since January when she stopped taking her birth control pills.  She has period once a month but it lasted for about 5 days, but it is heavy throughout, has to use 2 pads at the same time, so that she does not have any leakage.  Also we are stopping the birth control pills.  She had noticed that her migraines that she used to have without or every month before her period.  They have stopped.  Over the past couple years, she has also noticed some uterine and bladder leakage that is as abated by laughing and cough whenever she feels that her bladder is full but she has not had any  urgency, hesitancy, burning when she pees or any sort of back pain.  She is not currently sexually active and has not been sexually active for the past 5 years.  The patient is also concerned that she may be beginning menopause was the another reason why she stopped taking her birth control pills to see if she had begun menopause.  She gets hot flashes rarely has not noticed any increase in irritability.  The patient had a tubal that 18 years ago for birth control and is not interested currently in any sort of hormonal replacement because she please of those related to her migraine.  The patient's past medical history her:  OBSTETRICAL HISTORY:  She is a G2, P2, who had a tubal 18 years ago. Her last  menstrual period was 31 weeks ago.  Menarche was at age 6. Her periods are regular and heavy.  They last five to seven days.  She has severe pain with these.  She does not have any spotting between periods.  PAST  MEDICAL HISTORY:  Medically is unchanged from last visit.  PAST SURGICAL HISTORY:  Unchanged from last visit.  PHYSICAL EXAMINATION:  VITAL SIGNS:  Stable.  Her temperature is 97.3, pulses 96, blood pressure is 116/76. GENERAL:  The patient is in no acute distress. CARDIOVASCULAR:  There were no abnormal finding.  She has regular rate and rhythm.  No murmurs, rubs, or gallops. CHEST:  On auscultation and pulmonary exam, she has been Clear to auscultation bilaterally. ABDOMEN:  She has positive bowel sounds.  She is soft and nontender.  No masses were palpated. GENITOURINARY:  The patient did have some mild vaginal atrophy externally.  She had no external lesions or masses palpated.  The patient has as well as denies mucosa and vaginal rugae.  The patient had no cervical discharge.  No vaginal discharge.  Overall, the patient is a 50 year old G2, P2, with mild incontinence likely secondary to stress incontinence, heavy menses, here for her annual Pap smear exam. 1. As far  as her heavy menses, the patient has been recommend to take     a Mono-Gesic pill every day for the next 3 months and return at     that time to see if her bleeding has decreased. 2. As far as the patient's vaginal itching, we did get a wet prep     today and we will send. 3. As far as the patient's incontinence, it was likely stress-related.     We recommended Kegel exercise at this time.  The patient will     return in 2-3 months to see if those were working for her. 4. As far as her well-woman exam, proceeded with well routine woman     care and took a Pap smear per but recommended to the patient to     return in 2-3 years for a followup Pap smear if this one was     normal.  Given the patient's heavy menses, we will also be checking     a CBC today and her questionable menopause we will be checking FSH     as well.          ______________________________ Maylon Cos, CNM    SS/MEDQ  D:  01/03/2011  T:  01/04/2011  Job:  [id number]

## 2011-01-28 LAB — COMPREHENSIVE METABOLIC PANEL
AST: 17 U/L (ref 0–37)
Albumin: 4.2 g/dL (ref 3.5–5.2)
BUN: 11 mg/dL (ref 6–23)
Calcium: 9.3 mg/dL (ref 8.4–10.5)
Creatinine, Ser: 0.9 mg/dL (ref 0.4–1.2)
GFR calc Af Amer: >60 mL/min (ref >60)
GFR calc non Af Amer: >60 mL/min (ref >60)

## 2011-01-28 LAB — LIPASE, BLOOD: Lipase: 231 U/L (ref 23–300)

## 2011-01-28 LAB — CBC
HCT: 39.7 % (ref 36.0–46.0)
Hemoglobin: 13.4 g/dL (ref 12.0–15.0)
MCHC: 33.9 g/dL (ref 30.0–36.0)
MCV: 88 fL (ref 78.0–100.0)
Platelets: 314 10*3/uL (ref 150–400)
RBC: 4.5 MIL/uL (ref 3.87–5.11)
RDW: 12.8 % (ref 11.5–15.5)
WBC: 9 K/uL (ref 4.0–10.5)

## 2011-01-28 LAB — COMPREHENSIVE METABOLIC PANEL WITH GFR
ALT: <6 U/L (ref 0–35)
Alkaline Phosphatase: 53 U/L (ref 39–117)
CO2: 23 meq/L (ref 19–32)
Chloride: 107 meq/L (ref 96–112)
Glucose, Bld: 144 mg/dL — ABNORMAL HIGH (ref 70–99)
Potassium: 3.5 meq/L (ref 3.5–5.1)
Sodium: 141 meq/L (ref 135–145)
Total Bilirubin: 0.3 mg/dL (ref 0.3–1.2)
Total Protein: 7.8 g/dL (ref 6.0–8.3)

## 2011-01-28 LAB — DIFFERENTIAL
Basophils Absolute: 0 10*3/uL (ref 0.0–0.1)
Basophils Relative: 0 % (ref 0–1)
Eosinophils Absolute: 0.1 K/uL (ref 0.0–0.7)
Eosinophils Relative: 1 % (ref 0–5)
Lymphocytes Relative: 28 % (ref 12–46)
Lymphs Abs: 2.5 10*3/uL (ref 0.7–4.0)
Monocytes Absolute: 0.7 10*3/uL (ref 0.1–1.0)
Monocytes Relative: 8 % (ref 3–12)
Neutro Abs: 5.7 10*3/uL (ref 1.7–7.7)
Neutrophils Relative %: 63 % (ref 43–77)

## 2011-01-28 LAB — POCT CARDIAC MARKERS
CKMB, poc: <1 ng/mL — ABNORMAL LOW (ref 1.0–8.0)
Troponin i, poc: <0.05 ng/mL (ref 0.00–0.09)

## 2011-01-31 ENCOUNTER — Ambulatory Visit: Payer: BC Managed Care – PPO | Admitting: Family Medicine

## 2011-02-01 LAB — COMPREHENSIVE METABOLIC PANEL
Albumin: 4.3 g/dL (ref 3.5–5.2)
Alkaline Phosphatase: 52 U/L (ref 39–117)
BUN: 11 mg/dL (ref 6–23)
CO2: 24 mEq/L (ref 19–32)
Chloride: 106 mEq/L (ref 96–112)
Creatinine, Ser: 0.7 mg/dL (ref 0.4–1.2)
GFR calc non Af Amer: >60 mL/min (ref >60)
Glucose, Bld: 107 mg/dL — ABNORMAL HIGH (ref 70–99)
Potassium: 4 mEq/L (ref 3.5–5.1)
Total Bilirubin: 0.6 mg/dL (ref 0.3–1.2)

## 2011-02-01 LAB — URINALYSIS, ROUTINE W REFLEX MICROSCOPIC
Glucose, UA: NEGATIVE mg/dL
Ketones, ur: NEGATIVE mg/dL
Protein, ur: NEGATIVE mg/dL
Urobilinogen, UA: 1 mg/dL (ref 0.0–1.0)

## 2011-02-01 LAB — URINE MICROSCOPIC-ADD ON

## 2011-02-01 LAB — CBC
HCT: 38.7 % (ref 36.0–46.0)
Hemoglobin: 13.2 g/dL (ref 12.0–15.0)
MCV: 87.9 fL (ref 78.0–100.0)
Platelets: 293 10*3/uL (ref 150–400)
RBC: 4.41 MIL/uL (ref 3.87–5.11)
WBC: 10 10*3/uL (ref 4.0–10.5)

## 2011-02-01 LAB — DIFFERENTIAL
Basophils Absolute: 0.1 10*3/uL (ref 0.0–0.1)
Basophils Relative: 1 % (ref 0–1)
Lymphocytes Relative: 18 % (ref 12–46)
Monocytes Absolute: 0.9 10*3/uL (ref 0.1–1.0)
Neutro Abs: 7.1 10*3/uL (ref 1.7–7.7)

## 2011-02-01 LAB — LIPASE, BLOOD: Lipase: 160 U/L (ref 23–300)

## 2011-02-20 ENCOUNTER — Emergency Department (HOSPITAL_BASED_OUTPATIENT_CLINIC_OR_DEPARTMENT_OTHER)
Admission: EM | Admit: 2011-02-20 | Discharge: 2011-02-20 | Disposition: A | Discharge status: Home / Self Care | Payer: BC Managed Care – PPO | Attending: Emergency Medicine | Admitting: Emergency Medicine

## 2011-02-20 DIAGNOSIS — R05 Cough: Secondary | ICD-10-CM | POA: Insufficient documentation

## 2011-02-20 DIAGNOSIS — I10 Essential (primary) hypertension: Secondary | ICD-10-CM | POA: Insufficient documentation

## 2011-02-20 DIAGNOSIS — R059 Cough, unspecified: Secondary | ICD-10-CM | POA: Insufficient documentation

## 2011-02-20 DIAGNOSIS — R51 Headache: Secondary | ICD-10-CM | POA: Insufficient documentation

## 2011-02-21 ENCOUNTER — Ambulatory Visit: Payer: BC Managed Care – PPO | Admitting: Family Medicine

## 2011-02-23 ENCOUNTER — Emergency Department (INDEPENDENT_AMBULATORY_CARE_PROVIDER_SITE_OTHER): Payer: BC Managed Care – PPO

## 2011-02-23 ENCOUNTER — Emergency Department (HOSPITAL_BASED_OUTPATIENT_CLINIC_OR_DEPARTMENT_OTHER)
Admission: EM | Admit: 2011-02-23 | Discharge: 2011-02-24 | Disposition: A | Discharge status: Home / Self Care | Payer: BC Managed Care – PPO | Attending: Emergency Medicine | Admitting: Emergency Medicine

## 2011-02-23 DIAGNOSIS — R509 Fever, unspecified: Secondary | ICD-10-CM | POA: Insufficient documentation

## 2011-02-23 DIAGNOSIS — R059 Cough, unspecified: Secondary | ICD-10-CM

## 2011-02-23 DIAGNOSIS — E860 Dehydration: Secondary | ICD-10-CM | POA: Insufficient documentation

## 2011-02-23 DIAGNOSIS — R05 Cough: Secondary | ICD-10-CM

## 2011-02-23 DIAGNOSIS — J40 Bronchitis, not specified as acute or chronic: Secondary | ICD-10-CM | POA: Insufficient documentation

## 2011-02-23 DIAGNOSIS — I10 Essential (primary) hypertension: Secondary | ICD-10-CM | POA: Insufficient documentation

## 2011-02-23 DIAGNOSIS — E876 Hypokalemia: Secondary | ICD-10-CM | POA: Insufficient documentation

## 2011-02-23 LAB — DIFFERENTIAL
Basophils Absolute: 0 10*3/uL (ref 0.0–0.1)
Basophils Relative: 0 % (ref 0–1)
Eosinophils Absolute: 0.1 10*3/uL (ref 0.0–0.7)
Lymphocytes Relative: 32 % (ref 12–46)
Monocytes Absolute: 0.9 10*3/uL (ref 0.1–1.0)
Neutro Abs: 3.8 10*3/uL (ref 1.7–7.7)
Neutrophils Relative %: 54 % (ref 43–77)

## 2011-02-23 LAB — COMPREHENSIVE METABOLIC PANEL
ALT: 30 U/L (ref 0–35)
AST: 31 U/L (ref 0–37)
Albumin: 3.9 g/dL (ref 3.5–5.2)
Calcium: 9.5 mg/dL (ref 8.4–10.5)
Creatinine, Ser: 0.8 mg/dL (ref 0.4–1.2)
GFR calc Af Amer: >60 mL/min (ref >60)
Sodium: 137 mEq/L (ref 135–145)

## 2011-02-23 LAB — CBC
MCH: 28.2 pg (ref 26.0–34.0)
MCHC: 35.3 g/dL (ref 30.0–36.0)
Platelets: 257 10*3/uL (ref 150–400)
RBC: 5.21 MIL/uL — ABNORMAL HIGH (ref 3.87–5.11)

## 2011-02-24 LAB — BASIC METABOLIC PANEL
CO2: 22 mEq/L (ref 19–32)
Chloride: 99 mEq/L (ref 96–112)
Glucose, Bld: 162 mg/dL — ABNORMAL HIGH (ref 70–99)
Potassium: 3.2 mEq/L — ABNORMAL LOW (ref 3.5–5.1)
Sodium: 136 mEq/L (ref 135–145)

## 2011-03-06 NOTE — Group Therapy Note (Signed)
Gina Wise, REIS NO.:  [id number]   MEDICAL RECORD NO.:  [id number]          PATIENT TYPE:  WOC   LOCATION:  [facility]                   FACILITY:  [facility]   PHYSICIAN:  Ginger Carne, MD DATE OF BIRTH:  Jun 27, 1961   DATE OF SERVICE:                                  CLINIC NOTE   CHIEF COMPLAINT:  Uterine fibroids and menorrhagia.   HISTORY OF PRESENT ILLNESS:  This is a 50 year old pleasant white female  who presents as a new patient as a referral of herself for evaluation  for fibroids and menorrhagia as well as being due for PAP smear.  The  patient reports that she moved here from [place] 2 years ago where she  was previously followed for her fibroids and menorrhagia and has not  been established with a gynecologist since then.  She was diagnosed with  uterine fibroids several years ago and reports she has undergone  multiple procedures including a D&C in 2006 for these but continues to  have heavy bleeding.  The patient reports her menstrual cycles are  regular, every 26 days and she bleeds for 5 to 7 days with heavy  bleeding requiring changing of thick pads every 2 hours.  The patient  reports she has had hormone levels checked previously and may have had a  slightly elevated testosterone.  Her symptoms are previously well  controlled on oral contraceptive pills at low dose but she has been off  of these for 2 years, as she has not had a gynecologist.  She is  interested in restarting these for her symptoms.   The patient is not using any form of contraception; however, she is not  sexually active and has not been for several years now.  She denies any  history of sexually transmitted diseases and declines testing today.  She denies any history of abnormal PAP smears with the exception of a  single __________ positive for which she did not require any treatment  but was rather normal on recheck.  The patient's last PAP smear was in  November of  2005 and was normal at that time.  Her last mammogram was  also in November of 2005 and was normal.  The patient has had a  screening colonoscopy which was performed in 2006 and normal.   PAST MEDICAL HISTORY:  1. Significant for depression, anxiety for which the patient is on      Zoloft and well controlled.  2. Iron deficiency anemia secondary to fibroids and menorrhagia.  The      patient is on oral iron therapy.  3. Fibroids and menorrhagia as mentioned above.   PAST SURGICAL HISTORY:  1. Status post D&C for fibroids and menorrhagia in 2006.  2. Status post appendectomy in 1987.  3. Status post shoulder surgery x2 in 1995 and 1997.  4. Status post right inguinal hernia repair in 2006 with mesh.   HOME MEDICATIONS:  1. Zoloft 15 mg p.o. daily.  2. Iron 200 mg p.o. daily.   ALLERGIES:  The patient has no allergy to latex but is allergic to  ERYTHROMYCIN and CODEINE.   SOCIAL HISTORY:  The patient recently lost her job working in [employer] and is re-employed; however, took a significant pay  cut.  She is a single mom and lives with her 41 year old son and her  mother whom she helps care for.  She is a nonsmoker and drinks only  occasional alcohol socially and denies any recreational drugs or history  of IV drug use.  Her father was murdered several years ago and her  husband left her 10 years ago.  Her oldest son, who is 10, currently  lives with his father.  The patient is not currently sexually active and  has no history of sexually transmitted diseases.   FAMILY HISTORY:  Multiple family relatives have hypertension.  The  patient has both a paternal and maternal aunt who had breast cancer but  no first degree relative with breast, ovarian, or colon cancer.  The  patient's maternal grandfather had colon cancer in his 76s as well as  emphysema and angina.   REVIEW OF SYSTEMS:  The patient denies any recent illness, fever, or  chills.  She does complain of  steady weight gain over the last 2 years  since being off of oral contraceptive therapy.  No unexplained weight  loss.  The patient denies chest pain or shortness of breath.  Denies  constipation, diarrhea, bloody or melanotic stools.  Denies dysuria or  hematuria.  The patient does report some increased urinary frequency  since gaining weight over the last 2 years but nothing excessive.  She  gets up in the middle of the night to use the bathroom 2 to 3 times  nightly.  This is unchanged.  The patient denies symptoms of depression  or anxiety currently and reports that they are adequately controlled on  her Zoloft.  Denies suicidal or homicidal ideation.  Denies any domestic  violence or abuse.   PHYSICAL EXAM:  Temperature is 98.3, heart rate 90, blood pressure  123/81.  Weight is 181.8 pounds and height 66 inches.  GENERAL:  This is a mildly overweight appearing in no acute distress who  is alert and oriented x3.  HEENT:  Head is normocephalic and atraumatic.  The patient is wearing  glasses.  The patient has moist mucous membranes.  NECK:  Supple and nontender and thyroid is normal in size without  nodules or tenderness.  CARDIOVASCULAR:  Regular rate and rhythm with no murmurs, rubs, or  gallops and 2+ dorsalis pedis pulses.  LUNGS:  Clear to auscultation bilaterally with normal work of breathing  and no wheezes, rales, or rhonchi.  ABDOMEN:  Normoactive bowel sounds, is soft, nontender, and  nondistended, mildly obese.  EXTREMITIES:  No clubbing, cyanosis, or edema.  GENITOURINARY:  Patient has normal external female genitalia with no  lesions.  Vaginal mucosa is pink and moist without lesions.  Cervix is  well visualized and without lesions.  PAP smear was performed.  BIMANUAL EXAM:  Uterus is slightly larger than expected for age and  being nongravid; however, it is terribly enlarged. Adnexa are nontender  bilaterally with no masses.   ASSESSMENT:  This is a 50 year old  white female, new patient, who  presents with a history of uterine fibroids, menorrhagia, and iron  deficiency anemia desiring treatment.  1. Fibroids:  Given patient is new and we have no prior record,      patient completed a release of information form today to obtain  records from previous diagnostic and therapeutic interventions      performed by her previous gynecologist in [place].  Advised patient      that prior to restarting hormonal placement therapy for her      symptoms, we will need to obtain an endometrial biopsy as this has      not been done within the last 6 months.  We will schedule patient      to come back for endometrial biopsy at the next available time      slot.  2. Menorrhagia:  Likely secondary to number 1.  We will schedule      patient to come back for endometrial biopsy prior to initiating any      treatment and we will also await records from previous gynecologist      in [place] for further review.  We will obtain further workup for      lab work as necessary based on these results.  3. Iron deficiency anemia:  Patient reports her hemoglobin was checked      within the last 2 months by a local doctor and has signed for a      release of information from this doctor and we will await these      results prior to rechecking hemoglobin.  The patient is to continue      on oral iron therapy.  4. Health maintenance:  Breast exam and PAP smear were performed today      as patient had not had since November of 2005.  We will refer      patient for mammogram as this is also due and patient reports she      had a screening colonoscopy in 2006 which was normal.   FOLLOWUP:  Patient is to schedule a followup appointment for endometrial  biopsy at the next available time slot at which point we will further  review her outside records and determine treatment for her menorrhagia  and uterine fibroids.     ______________________________  Macarthur Critchley, M.D.     ______________________________  Ginger Carne, MD    EE/MEDQ  D:  01/21/2008  T:  01/21/2008  Job:  [id number]

## 2011-03-06 NOTE — Group Therapy Note (Signed)
Gina Wise, VUKELICH NO.:  [id number]   MEDICAL RECORD NO.:  [id number]          PATIENT TYPE:  WOC   LOCATION:  [facility]                   FACILITY:  [facility]   PHYSICIAN:  Argentina Donovan, MD        DATE OF BIRTH:  Nov 22, 1960   DATE OF SERVICE:  02/18/2008                                  CLINIC NOTE   Please review previous note by Dr. Mia Creek. This patient is a 50-year-  old white female, gravida 2, para 2-0-0-2 who moved to [place]  from [place] after husband left her and because she has a son who is  dyslexic and the school system  there was not taking care of this  problem.  Her main problem has been severe dysmenorrhea with  menorrhagia.  She also has been bothered by PMDD and she has had some  unfortunate occurrences in her life that have made things difficult for  her to cope.  She is on Zoloft at the present time, 50 mg a day. She  said when she was on birth control pills it did help significantly  control her PMDD and also her dysmenorrhea, so we have started her on  Yaz today.  I gave her four packs to begin with.  I have also given her  some information on the NovaSure endometrial ablation.  She had some  kind of a vague history and D&C of fibroids although on  examination her  uterus is of normal size, shape, consistency.  Anterior flexed freely  movable normal free adnexa. Endometrial biopsy was done and the Graves  speculum placed in the vagina through a marital introitus.  BUS within  normal limits.  Vagina externally was normal.  Cervix is parous, was  grasped with single-tooth tenaculum. Uterine cavity was sounded to 7 cm.  Endometrial biopsy was taken with the suction curette without incident.  The tenaculum speculum removed from the vagina.  The patient had very  little bleeding. Told her I would call her with the results of that  biopsy. We started her once again on Yaz oral contraceptives.  She is  going to consider the endometrial  ablation and see how the pills work  for her.   IMPRESSION:  Menorrhagia with severe dysmenorrhea PMDD.           ______________________________  Argentina Donovan, MD     PR/MEDQ  D:  02/18/2008  T:  02/18/2008  Job:  [id number]

## 2011-03-06 NOTE — Group Therapy Note (Signed)
NAMEMARAH, [name]:  [id number]   MEDICAL RECORD NO.:  [id number]          PATIENT TYPE:  WOC   LOCATION:  [facility]                   FACILITY:  [facility]   PHYSICIAN:  Argentina Donovan, MD        DATE OF BIRTH:  07/24/1961   DATE OF SERVICE:  05/13/2008                                  CLINIC NOTE   The patient is a 50 year old white female gravida 2, para 2-0-0-2 who  has a note in the chart to read from her last visit.  She has PMDD and  was having significant menometrorrhagia with heavy, heavy periods.  We  put her on Yaz in April after an endometrial biopsy came back normal, as  well as a Pap smear, and she has done very well.  The periods are  lighter and somewhat manageable and the PMDD is well under control.  She  is happy right now but she says that she would prefer to have less  bleeding and I told her that it may get less after she is on the Yaz for  a few more months.  If it is not, what I would do is to do the  endometrial ablation, of which we have given her several pamphlets and  placed her on spironolactone as an alternative to the Yaz.  She is going  to think about it and come back in 3 months.   IMPRESSION:  Premenstrual dysphoric disorder, improved as well as  dysfunctional uterine bleeding.           ______________________________  Argentina Donovan, MD     PR/MEDQ  D:  05/13/2008  T:  05/13/2008  Job:  [id number]

## 2011-03-21 ENCOUNTER — Other Ambulatory Visit: Payer: Self-pay | Admitting: Family Medicine

## 2011-03-21 MED ORDER — LORATADINE 10 MG PO TABS: 10.0000 mg | ORAL_TABLET | Freq: Every day | ORAL | Status: DC

## 2011-03-21 MED ORDER — SERTRALINE HCL 100 MG PO TABS: 100.0000 mg | ORAL_TABLET | Freq: Every day | ORAL | Status: DC

## 2011-03-30 ENCOUNTER — Emergency Department (HOSPITAL_BASED_OUTPATIENT_CLINIC_OR_DEPARTMENT_OTHER)
Admission: EM | Admit: 2011-03-30 | Discharge: 2011-03-30 | Disposition: A | Discharge status: Home / Self Care | Payer: BC Managed Care – PPO | Attending: Emergency Medicine | Admitting: Emergency Medicine

## 2011-03-30 DIAGNOSIS — G43909 Migraine, unspecified, not intractable, without status migrainosus: Secondary | ICD-10-CM | POA: Insufficient documentation

## 2011-03-30 DIAGNOSIS — I10 Essential (primary) hypertension: Secondary | ICD-10-CM | POA: Insufficient documentation

## 2011-03-30 DIAGNOSIS — Z79899 Other long term (current) drug therapy: Secondary | ICD-10-CM | POA: Insufficient documentation

## 2011-07-20 LAB — DIFFERENTIAL
Basophils Relative: 1
Monocytes Absolute: 0.5
Monocytes Relative: 7
Neutro Abs: 5

## 2011-07-20 LAB — CBC
HCT: 42
Hemoglobin: 14.6
MCHC: 34.7
RBC: 4.84

## 2011-07-20 LAB — BASIC METABOLIC PANEL
CO2: 21
Calcium: 9.1
Chloride: 109
GFR calc Af Amer: >60
Potassium: 4.1
Sodium: 140

## 2011-07-30 ENCOUNTER — Other Ambulatory Visit: Payer: Self-pay | Admitting: Family Medicine

## 2011-07-30 MED ORDER — HYDROCHLOROTHIAZIDE 25 MG PO TABS: 25.0000 mg | ORAL_TABLET | Freq: Every day | ORAL | Status: DC

## 2011-08-03 ENCOUNTER — Encounter: Payer: BC Managed Care – PPO | Admitting: Family Medicine

## 2011-09-03 ENCOUNTER — Ambulatory Visit (INDEPENDENT_AMBULATORY_CARE_PROVIDER_SITE_OTHER): Payer: 59 | Admitting: Family Medicine

## 2011-09-03 VITALS — BP 138/92 | HR 84 | Ht 66.0 in | Wt 159.0 lb

## 2011-09-03 DIAGNOSIS — E039 Hypothyroidism, unspecified: Secondary | ICD-10-CM

## 2011-09-03 DIAGNOSIS — G8929 Other chronic pain: Secondary | ICD-10-CM

## 2011-09-03 DIAGNOSIS — I10 Essential (primary) hypertension: Secondary | ICD-10-CM

## 2011-09-03 DIAGNOSIS — M542 Cervicalgia: Secondary | ICD-10-CM

## 2011-09-03 DIAGNOSIS — E785 Hyperlipidemia, unspecified: Secondary | ICD-10-CM | POA: Insufficient documentation

## 2011-09-03 DIAGNOSIS — E663 Overweight: Secondary | ICD-10-CM

## 2011-09-03 MED ORDER — MELOXICAM 7.5 MG PO TABS: 7.5000 mg | ORAL_TABLET | Freq: Every day | ORAL | Status: DC

## 2011-09-03 NOTE — Assessment & Plan Note (Signed)
Lost 30 lbs since the last visit. Way to go!

## 2011-09-03 NOTE — Progress Notes (Signed)
Gina Wise presents to clinic today to discuss her neck pain, HTN and lipids.  She had a health screening recently and had a total cholesterol of 270 and a HDL of 49.  Her recent BP was 115/80.   Neck Pain: Chronic midline. Present for years. 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 She sits and works at a [employer]. She denies any numbness, tingling weakness in her hands BL. She notes that she has decreased range of motion in her neck and   She will sometimes hear a grinding noise with head ROM.   PMH reviewed.  ROS as above otherwise neg Medications reviewed. Current Outpatient Prescriptions  Medication Sig Dispense Refill  . hydrochlorothiazide (HYDRODIURIL) 25 MG tablet Take 1 tablet (25 mg total) by mouth daily.  30 tablet  6  . levothyroxine (SYNTHROID, LEVOTHROID) 88 MCG tablet Take 88 mcg by mouth daily.        . Multiple Vitamins-Minerals (WOMENS MULTIVITAMIN PLUS) TABS Take 1 tablet by mouth daily.        . sertraline (ZOLOFT) 100 MG tablet Take 1 tablet (100 mg total) by mouth daily.  30 tablet  12  . loratadine (CLARITIN) 10 MG tablet Take 1 tablet (10 mg total) by mouth daily.  30 tablet  12  . meloxicam (MOBIC) 7.5 MG tablet Take 1 tablet (7.5 mg total) by mouth daily.  30 tablet  1    Exam:  BP 138/92  Pulse 84  Ht 5\' 6"  (1.676 m)  Wt 159 lb (72.122 kg)  BMI 25.66 kg/m2 Gen: Well NAD HEENT: EOMI,  MMM Lungs: CTABL Nl WOB Heart: RRR no MRG Abd: NABS, NT, ND Exts: Non edematous BL  LE, warm and well perfused.  MSK: Non tender spinal midline. Decreased neck ROM to forward and lateral flexion and rotation.  Arms have normal reflexes BL and normal strength and sensation.

## 2011-09-03 NOTE — Assessment & Plan Note (Signed)
Likely due to arthritis. Will refer to PT for home exercises.  Will also prescribe meloxicam and tylenol for use as needed.  Follow PRN>

## 2011-09-03 NOTE — Patient Instructions (Addendum)
Thank you for coming in today. I will call you with the results of your labs today.  We will call you about PT appt time.  I don't think you will need a lot of visits. We just need some teaching.  Take the meloxican daily as needed.  If you take it for more than 10 days in a row I would recommend taking zantac or pepcid with it.  See me in 6-12 months or sooner if needed likely.

## 2011-09-03 NOTE — Assessment & Plan Note (Signed)
BP well controlled at home and work.  Will follow.  No changes planned.

## 2011-09-03 NOTE — Assessment & Plan Note (Signed)
Will check direct LDL today.

## 2011-09-24 ENCOUNTER — Telehealth: Payer: Self-pay | Admitting: Family Medicine

## 2011-09-24 MED ORDER — CLONAZEPAM 0.5 MG PO TABS: 0.5000 mg | ORAL_TABLET | Freq: Two times a day (BID) | ORAL | Status: DC

## 2011-09-24 MED ORDER — CLONAZEPAM 0.25 MG PO TBDP: 0.2500 mg | ORAL_TABLET | Freq: Three times a day (TID) | ORAL | Status: DC | PRN

## 2011-09-24 NOTE — Telephone Encounter (Signed)
Gina Wise called today.  Her mother just died and she had decided to take her off life support.  She is very anxious and grief stricken.  She has not slept in 3 days.  She wonders if there's any medication I can do for her.  Plan to prescribe clonazepam for one month and follow patient up in one week.

## 2011-09-24 NOTE — Telephone Encounter (Signed)
Pharmacy called back. Don't have 0.25mg  tablets. Switched to 0.5mg  tablets.

## 2011-09-25 ENCOUNTER — Other Ambulatory Visit: Payer: Self-pay | Admitting: Family Medicine

## 2011-09-25 ENCOUNTER — Ambulatory Visit: Payer: 59 | Admitting: Rehabilitation

## 2011-09-25 MED ORDER — RIZATRIPTAN BENZOATE 10 MG PO TABS: 10.0000 mg | ORAL_TABLET | Freq: Once | ORAL | Status: DC | PRN

## 2011-09-25 NOTE — Telephone Encounter (Signed)
Migraine is consistent with her history of migraine. Has successfully been on this medication in the past. Prescription called in with 2 refills. We'll followup in my clinic in one week. Left a voicemail stating this

## 2011-09-25 NOTE — Telephone Encounter (Signed)
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 Pt goes to [employer].

## 2011-09-26 ENCOUNTER — Emergency Department (HOSPITAL_BASED_OUTPATIENT_CLINIC_OR_DEPARTMENT_OTHER)
Admission: EM | Admit: 2011-09-26 | Discharge: 2011-09-26 | Disposition: A | Discharge status: Home / Self Care | Payer: 59 | Attending: Emergency Medicine | Admitting: Emergency Medicine

## 2011-09-26 ENCOUNTER — Other Ambulatory Visit: Payer: Self-pay

## 2011-09-26 ENCOUNTER — Encounter (HOSPITAL_BASED_OUTPATIENT_CLINIC_OR_DEPARTMENT_OTHER): Payer: Self-pay | Admitting: Family Medicine

## 2011-09-26 DIAGNOSIS — G43909 Migraine, unspecified, not intractable, without status migrainosus: Secondary | ICD-10-CM | POA: Insufficient documentation

## 2011-09-26 DIAGNOSIS — Z79899 Other long term (current) drug therapy: Secondary | ICD-10-CM | POA: Insufficient documentation

## 2011-09-26 DIAGNOSIS — E039 Hypothyroidism, unspecified: Secondary | ICD-10-CM | POA: Insufficient documentation

## 2011-09-26 DIAGNOSIS — I10 Essential (primary) hypertension: Secondary | ICD-10-CM | POA: Insufficient documentation

## 2011-09-26 HISTORY — DX: Migraine, unspecified, not intractable, without status migrainosus: G43.909

## 2011-09-26 HISTORY — DX: Essential (primary) hypertension: I10

## 2011-09-26 MED ORDER — SODIUM CHLORIDE 0.9 % IV BOLUS (SEPSIS)
1000.0000 mL | Freq: Once | INTRAVENOUS | Status: AC
  Administered 2011-09-26: 1000 mL via INTRAVENOUS

## 2011-09-26 MED ORDER — DROPERIDOL 2.5 MG/ML IJ SOLN
2.5000 mg | Freq: Once | INTRAMUSCULAR | Status: AC
  Administered 2011-09-26: 2.5 mg via INTRAVENOUS
  Filled 2011-09-26: qty 2

## 2011-09-26 MED ORDER — FENTANYL CITRATE 0.05 MG/ML IJ SOLN: 100.0000 ug | Freq: Once | INTRAMUSCULAR | Status: DC

## 2011-09-26 MED ORDER — KETOROLAC TROMETHAMINE 30 MG/ML IJ SOLN
30.0000 mg | Freq: Once | INTRAMUSCULAR | Status: AC
  Administered 2011-09-26: 30 mg via INTRAVENOUS
  Filled 2011-09-26: qty 1

## 2011-09-26 NOTE — ED Notes (Signed)
Pt c/o migraine with h/o same. Pt sts she took Maxalt yesterday and Klonipin last  Night for migraine without relief. Pt sts her "mother passed away yesterday and I have been crying and not eating or drinking".

## 2011-09-26 NOTE — ED Notes (Signed)
Pt. Reported to RN she is feeling like she can go home and rest now.

## 2011-09-26 NOTE — ED Provider Notes (Signed)
History     CSN: [id number] Arrival date & time: 09/26/2011 10:33 AM   First MD Initiated Contact with Patient 09/26/11 1049      Chief Complaint  Patient presents with  . Migraine    (Consider location/radiation/quality/duration/timing/severity/associated sxs/prior treatment) HPI Pt c/o migraine with h/o same. Pt sts she took Maxalt yesterday and Klonipin last Night for migraine without relief. Pt sts her "mother passed away yesterday and I have been crying and not eating or drinking".  Patient denies fever, chills.  Has had nausea and photophobia.  Migraine is described as similar to previous headaches.  Past Medical History  Diagnosis Date  . Hypothyroid   . Depression   . Overweight   . Migraines   . Hypertension     Past Surgical History  Procedure Date  . Appendectomy   . Tonsillectomy   . Tubal ligation   . Inguinal hernia repair   . Rotator cuff repair     left    Family History  Problem Relation Age of Onset  . Hypertension Father     History  Substance Use Topics  . Smoking status: Never Smoker   . Smokeless tobacco: Not on file  . Alcohol Use: No    OB History    Grav Para Term Preterm Abortions TAB SAB Ect Mult Living                  Review of Systems All other review of systems are negative except as noted in history of present illness Allergies  Codeine and Erythromycin  Home Medications   Current Outpatient Rx  Name Route Sig Dispense Refill  . CLONAZEPAM 0.5 MG PO TABS Oral Take 1 tablet (0.5 mg total) by mouth 2 (two) times daily. 45 tablet 0  . HYDROCHLOROTHIAZIDE 25 MG PO TABS Oral Take 1 tablet (25 mg total) by mouth daily. 30 tablet 6  . LEVOTHYROXINE SODIUM 88 MCG PO TABS Oral Take 88 mcg by mouth daily.      Marland Kitchen LORATADINE 10 MG PO TABS Oral Take 1 tablet (10 mg total) by mouth daily. 30 tablet 12  . MELOXICAM 7.5 MG PO TABS Oral Take 1 tablet (7.5 mg total) by mouth daily. 30 tablet 1  . WOMENS MULTIVITAMIN PLUS PO TABS Oral  Take 1 tablet by mouth daily.      Marland Kitchen RIZATRIPTAN BENZOATE 10 MG PO TABS Oral Take 1 tablet (10 mg total) by mouth once as needed for migraine. May repeat in 2 hours if needed 30 tablet 2  . SERTRALINE HCL 100 MG PO TABS Oral Take 1 tablet (100 mg total) by mouth daily. 30 tablet 12    BP 127/68  Pulse 77  Temp(Src) 97.6 F (36.4 C) (Oral)  SpO2 100%  LMP 09/19/2011  Physical Exam  Nursing note and vitals reviewed. Constitutional: She is oriented to person, place, and time. She appears well-developed and well-nourished. No distress.  HENT:  Head: Normocephalic and atraumatic.  Eyes: EOM are normal. Pupils are equal, round, and reactive to light.  Neck: Normal range of motion. Neck supple.  Cardiovascular: Normal rate and intact distal pulses.          Date: 09/26/2011  Rate: 62  Rhythm: normal sinus rhythm  QRS Axis: normal  Intervals: normal  ST/T Wave abnormalities: normal  Conduction Disutrbances:none:   Old EKG Reviewed: unchanged     Pulmonary/Chest: Effort normal and breath sounds normal. No respiratory distress.  Abdominal: Soft. Normal appearance. She exhibits  no distension.  Musculoskeletal: Normal range of motion.  Neurological: She is alert and oriented to person, place, and time. No cranial nerve deficit.  Skin: Skin is warm and dry. No rash noted.  Psychiatric: Her behavior is normal. She exhibits a depressed mood.    ED Course  Procedures (including critical care time) After treatment in the ED the patient feels back to baseline and wants to go home.  Labs Reviewed - No data to display    1. Migraine headache       MDM          Nelia Shi, MD 09/26/11 1325

## 2011-09-26 NOTE — ED Notes (Signed)
Pt refused to get into gown. 

## 2011-09-26 NOTE — ED Notes (Signed)
Dr. Radford Pax to cancel the Fentanyl order.

## 2011-09-26 NOTE — ED Notes (Signed)
Pt sts "normally I have to get Dilaudid and reglan to help my migraines".

## 2011-10-03 ENCOUNTER — Ambulatory Visit (INDEPENDENT_AMBULATORY_CARE_PROVIDER_SITE_OTHER): Payer: 59 | Admitting: Family Medicine

## 2011-10-03 ENCOUNTER — Encounter: Payer: Self-pay | Admitting: Family Medicine

## 2011-10-03 VITALS — BP 132/86 | HR 80 | Ht 66.0 in | Wt 159.0 lb

## 2011-10-03 DIAGNOSIS — F4320 Adjustment disorder, unspecified: Secondary | ICD-10-CM | POA: Insufficient documentation

## 2011-10-03 DIAGNOSIS — G4700 Insomnia, unspecified: Secondary | ICD-10-CM

## 2011-10-03 DIAGNOSIS — F4321 Adjustment disorder with depressed mood: Secondary | ICD-10-CM

## 2011-10-03 MED ORDER — CLONAZEPAM 0.5 MG PO TABS: 0.5000 mg | ORAL_TABLET | Freq: Two times a day (BID) | ORAL | Status: DC

## 2011-10-03 MED ORDER — ZOLPIDEM TARTRATE 5 MG PO TABS: 5.0000 mg | ORAL_TABLET | Freq: Every evening | ORAL | Status: DC | PRN

## 2011-10-03 NOTE — Patient Instructions (Addendum)
Thank you for coming in today. Ambien as needed at night. This is also not a long term medicine.  Clonazepam as needed for this month only.  Get in with a grief counselor. Let me know if you need a referral. See me in the new year if you are not doing well.

## 2011-10-03 NOTE — Progress Notes (Signed)
Ms. Seyller presents to clinic today to discuss her acute grief reaction with her mother's recent death.  Arisbeth was the medical power of attorney and that he decided to remove her mother from life support.  He called the clinic asking for help and I prescribed clonazepam scheduled. This did help quite a bit however she notes continued insomnia and running thoughts over and over and over in mind.  She went back to work Monday and notes decreased productivity at work but is functioning. She denies any suicidal or homicidal ideations but does still feel quite sad.  She will seek a grief counselor provided by her work in the next few days.    PMH reviewed.  ROS as above otherwise neg Medications reviewed. Current Outpatient Prescriptions  Medication Sig Dispense Refill  . clonazePAM (KLONOPIN) 0.5 MG tablet Take 1 tablet (0.5 mg total) by mouth 2 (two) times daily.  30 tablet  0  . hydrochlorothiazide (HYDRODIURIL) 25 MG tablet Take 1 tablet (25 mg total) by mouth daily.  30 tablet  6  . levothyroxine (SYNTHROID, LEVOTHROID) 88 MCG tablet Take 88 mcg by mouth daily.        Marland Kitchen loratadine (CLARITIN) 10 MG tablet Take 1 tablet (10 mg total) by mouth daily.  30 tablet  12  . meloxicam (MOBIC) 7.5 MG tablet Take 1 tablet (7.5 mg total) by mouth daily.  30 tablet  1  . Multiple Vitamins-Minerals (WOMENS MULTIVITAMIN PLUS) TABS Take 1 tablet by mouth daily.        . rizatriptan (MAXALT) 10 MG tablet Take 1 tablet (10 mg total) by mouth once as needed for migraine. May repeat in 2 hours if needed  30 tablet  2  . sertraline (ZOLOFT) 100 MG tablet Take 1 tablet (100 mg total) by mouth daily.  30 tablet  12  . zolpidem (AMBIEN) 5 MG tablet Take 1 tablet (5 mg total) by mouth at bedtime as needed for sleep.  30 tablet  1    Exam:  BP 132/86  Pulse 80  Ht 5\' 6"  (1.676 m)  Wt 159 lb (72.122 kg)  BMI 25.66 kg/m2  LMP 09/19/2011 Gen: Well NAD Mood: Eye contact is good affect is slightly sad patient is tearful at  times. Speech is normal thought process is linear and goal-directed. No suicidal or homicidal ideation.

## 2011-10-03 NOTE — Assessment & Plan Note (Signed)
Do to her grief reaction. We'll prescribe Ambien for a short period of time to help her sleep at night. Followup in one month

## 2011-10-03 NOTE — Assessment & Plan Note (Signed)
Symptoms consistent with acute grief reaction. Prescribing clonazepam to help her deal.  Will refill for one month only and followup in one month. Recommend grief counseling which she will seek at work. She seems overall to be managing but obviously is still quite distraught.

## 2011-10-25 ENCOUNTER — Encounter: Payer: Self-pay | Admitting: Family Medicine

## 2011-10-25 ENCOUNTER — Ambulatory Visit (INDEPENDENT_AMBULATORY_CARE_PROVIDER_SITE_OTHER): Payer: 59 | Admitting: Family Medicine

## 2011-10-25 VITALS — BP 108/72 | HR 76 | Temp 98.3°F | Ht 66.0 in | Wt 161.0 lb

## 2011-10-25 DIAGNOSIS — J069 Acute upper respiratory infection, unspecified: Secondary | ICD-10-CM

## 2011-10-25 DIAGNOSIS — F4320 Adjustment disorder, unspecified: Secondary | ICD-10-CM

## 2011-10-25 DIAGNOSIS — F4321 Adjustment disorder with depressed mood: Secondary | ICD-10-CM

## 2011-10-25 DIAGNOSIS — G43909 Migraine, unspecified, not intractable, without status migrainosus: Secondary | ICD-10-CM

## 2011-10-25 MED ORDER — PROMETHAZINE HCL 12.5 MG PO TABS: 12.5000 mg | ORAL_TABLET | Freq: Three times a day (TID) | ORAL | Status: DC | PRN

## 2011-10-25 MED ORDER — AZITHROMYCIN 250 MG PO TABS: ORAL_TABLET | ORAL | Status: AC

## 2011-10-25 NOTE — Assessment & Plan Note (Signed)
Currently doing as well as I would expect with gradual everyday improvement.  No longer requiring benzodiazepines.  We'll followup in one to 2 months or sooner if needed.

## 2011-10-25 NOTE — Patient Instructions (Signed)
Thank you for coming in today. Fill the Zpack on Monday if you are getting worse.  Take tylenol or ibuprofen for symptoms.  Come back in 2 months or sooner if needed.  If you are feeling great we can wait until May.

## 2011-10-25 NOTE — Progress Notes (Signed)
Gina Wise is a 51 year old woman who presents to clinic today to discuss a new cough and to followup her acute grief reaction following the death of her mother.  Cough nonproductive starting yesterday associated with some chills but no fever. She does note a headache and does feel fatigued. She notes that in April she had a similar symptoms that resulted in a pneumonia that required treatment with oral antibiotics and she's concerned she will develop a pneumonia with this. She denies any trouble breathing.   Mood: Currently getting grief counseling at work she still feels bad sad and guilty and says that she is "taking one day at a time ".  She is feeling little bit better however and to stop taking clonazepam and Ambien nightly.  She continues to take sertraline 100 mg a day.   Migraines: Having occasional migraines will take Maxalt occasionally but notes it is expensive.  Sumatriptan was not effective.    PMH reviewed.  ROS as above otherwise neg Medications reviewed. Current Outpatient Prescriptions  Medication Sig Dispense Refill  . hydrochlorothiazide (HYDRODIURIL) 25 MG tablet Take 1 tablet (25 mg total) by mouth daily.  30 tablet  6  . levothyroxine (SYNTHROID, LEVOTHROID) 88 MCG tablet Take 88 mcg by mouth daily.        Marland Kitchen loratadine (CLARITIN) 10 MG tablet Take 10 mg by mouth daily.        . Multiple Vitamins-Minerals (WOMENS MULTIVITAMIN PLUS) TABS Take 1 tablet by mouth daily.        . rizatriptan (MAXALT) 10 MG tablet Take 1 tablet (10 mg total) by mouth once as needed for migraine. May repeat in 2 hours if needed  30 tablet  2  . sertraline (ZOLOFT) 100 MG tablet Take 1 tablet (100 mg total) by mouth daily.  30 tablet  12  . zolpidem (AMBIEN) 5 MG tablet Take 1 tablet (5 mg total) by mouth at bedtime as needed for sleep.  30 tablet  1  . DISCONTD: loratadine (CLARITIN) 10 MG tablet Take 1 tablet (10 mg total) by mouth daily.  30 tablet  12  . azithromycin (ZITHROMAX Z-PAK) 250 MG  tablet 2 pills day 1 and 1 pill days 2-5  6 each  0    Exam:  BP 108/72  Pulse 76  Temp(Src) 98.3 F (36.8 C) (Oral)  Ht 5\' 6"  (1.676 m)  Wt 161 lb (73.029 kg)  BMI 25.99 kg/m2  LMP 09/19/2011 Gen: Well NAD HEENT: EOMI,  MMM, retracted tympanic membranes bilaterally non-erythematous.  Lungs: CTABL Nl WOB Heart: RRR no MRG Abd: NABS, NT, ND Exts: Non edematous BL  LE, warm and well perfused.  Mood: Affect is normal thought process is linear and goal-directed speech is normal no suicidal or homicidal ideation. Tearful at times.

## 2011-10-25 NOTE — Assessment & Plan Note (Signed)
Maxalt prescribed. Discussed evidence for aspirin plus Phenergan/Compazine for migraines. She's interested in trying this. I will provide a prescription for Phenergan 12.5 for use with [phone] mg of aspirin at the start of a migraine.

## 2011-10-25 NOTE — Assessment & Plan Note (Signed)
Her symptoms are consistent with a viral URI. However she is anxious about a pneumonia area I will provide symptom management with Tylenol and reassurance but a prescription for Z-Pak should she require it on Monday.  We'll followup as needed.

## 2011-11-05 ENCOUNTER — Ambulatory Visit: Payer: 59 | Admitting: Family Medicine

## 2011-12-07 ENCOUNTER — Other Ambulatory Visit: Payer: Self-pay | Admitting: Family Medicine

## 2011-12-08 NOTE — Telephone Encounter (Signed)
Refill request

## 2011-12-15 ENCOUNTER — Other Ambulatory Visit: Payer: Self-pay | Admitting: Family Medicine

## 2011-12-16 NOTE — Telephone Encounter (Signed)
Refill request

## 2011-12-28 ENCOUNTER — Encounter: Payer: Self-pay | Admitting: Family Medicine

## 2011-12-28 ENCOUNTER — Ambulatory Visit (INDEPENDENT_AMBULATORY_CARE_PROVIDER_SITE_OTHER): Payer: 59 | Admitting: Family Medicine

## 2011-12-28 VITALS — BP 146/86 | HR 80 | Temp 98.5°F | Ht 66.0 in | Wt 166.0 lb

## 2011-12-28 DIAGNOSIS — H60399 Other infective otitis externa, unspecified ear: Secondary | ICD-10-CM

## 2011-12-28 DIAGNOSIS — H6090 Unspecified otitis externa, unspecified ear: Secondary | ICD-10-CM

## 2011-12-28 DIAGNOSIS — J069 Acute upper respiratory infection, unspecified: Secondary | ICD-10-CM

## 2011-12-28 DIAGNOSIS — J329 Chronic sinusitis, unspecified: Secondary | ICD-10-CM

## 2011-12-28 MED ORDER — NEOMYCIN-POLYMYXIN-HC 3.5-10000-1 OT SOLN: 3.0000 [drp] | Freq: Four times a day (QID) | OTIC | Status: AC

## 2011-12-28 MED ORDER — FLUTICASONE PROPIONATE 50 MCG/ACT NA SUSP: NASAL | Status: DC

## 2011-12-28 NOTE — Patient Instructions (Signed)
   Avoid nasal decongestants Use nasal saline daily  Use the flonase as prescribed Follow up with your regular doctor in 2-3 weeks if not better Sinusitis Sinuses are air pockets within the bones of your face. The growth of bacteria within a sinus leads to infection. The infection prevents the sinuses from draining. This infection is called sinusitis. SYMPTOMS  There will be different areas of pain depending on which sinuses have become infected.  The maxillary sinuses often produce pain beneath the eyes.   Frontal sinusitis may cause pain in the middle of the forehead and above the eyes.  Other problems (symptoms) include:  Toothaches.   Colored, pus-like (purulent) drainage from the nose.   Swelling, warmth, and tenderness over the sinus areas may be signs of infection.  TREATMENT  Sinusitis is most often determined by an exam.X-rays may be taken. If x-rays have been taken, make sure you obtain your results or find out how you are to obtain them. Your caregiver may give you medications (antibiotics). These are medications that will help kill the bacteria causing the infection. You may also be given a medication (decongestant) that helps to reduce sinus swelling.  HOME CARE INSTRUCTIONS   Only take over-the-counter or prescription medicines for pain, discomfort, or fever as directed by your caregiver.   Drink extra fluids. Fluids help thin the mucus so your sinuses can drain more easily.   Applying either moist heat or ice packs to the sinus areas may help relieve discomfort.   Use saline nasal sprays to help moisten your sinuses. The sprays can be found at your local drugstore.  SEEK IMMEDIATE MEDICAL CARE IF:  You have a fever.   You have increasing pain, severe headaches, or toothache.   You have nausea, vomiting, or drowsiness.   You develop unusual swelling around the face or trouble seeing.  MAKE SURE YOU:   Understand these instructions.   Will watch your  condition.   Will get help right away if you are not doing well or get worse.  Document Released: 10/08/2005 Document Revised: 09/27/2011 Document Reviewed: 05/07/2007 [facility] Patient Information 2012 [place], [place].

## 2011-12-28 NOTE — Progress Notes (Signed)
  Subjective:    Patient ID: Gina Wise, female    DOB: 1961-08-03, 51 y.o.   MRN: [id number]  HPI Pt presents today with chief complaint of muffled hearing and head pressure. Pt states that symptoms have been present for the last 2-3 weeks. Symptoms were primarily rhinorrhea and nasal congestion, but have since become head pressure and trouble hearing. Pt has been using nasal decongestants since onset of symptoms with minimal improvement. No fevers or rash. + post nasal drip and cough. No dizziness. Muffled heairing seems to be more predominant on the L ear. Pt with a baseline hx/o allergic rhinitis that she has been on claritin for. Pt states that she has been compliant with this medication, even through illness. Predominant issue for the patient is muffled hearing.    Review of Systems See HPI, otherwise ROS negative     Objective:   Physical Exam Gen: up in chair, NAD HEENT: NCAT, EOMI, R TM WNL, L TM with mild canal erythema and tenderness to otoscopic evaluation, no TM bulging, + posterior oropharyngeal erythema, + mild nasal erythema CV: RRR, no murmurs auscultated PULM: CTAB, no wheezes, rales, rhoncii ABD: S/NT/+ bowel sounds  EXT: 2+ peripheral pulses   Assessment & Plan:

## 2011-12-30 DIAGNOSIS — H6090 Unspecified otitis externa, unspecified ear: Secondary | ICD-10-CM | POA: Insufficient documentation

## 2011-12-30 NOTE — Assessment & Plan Note (Signed)
Likely ovelapping viral and allergic disease. Discussed infectious red flags. Handout give. Also rx for flonase given given turbinate swelling.

## 2011-12-30 NOTE — Assessment & Plan Note (Signed)
Noted on clinical exam. WIll treat with cortisporin otic. Red flags reviewed.

## 2012-01-02 ENCOUNTER — Encounter: Payer: Self-pay | Admitting: Family Medicine

## 2012-01-02 ENCOUNTER — Telehealth: Payer: Self-pay | Admitting: *Deleted

## 2012-01-02 ENCOUNTER — Ambulatory Visit (INDEPENDENT_AMBULATORY_CARE_PROVIDER_SITE_OTHER): Payer: 59 | Admitting: Family Medicine

## 2012-01-02 VITALS — BP 128/85 | HR 80 | Temp 97.2°F | Resp 16 | Ht 66.0 in | Wt 164.9 lb

## 2012-01-02 DIAGNOSIS — N926 Irregular menstruation, unspecified: Secondary | ICD-10-CM

## 2012-01-02 DIAGNOSIS — N939 Abnormal uterine and vaginal bleeding, unspecified: Secondary | ICD-10-CM

## 2012-01-02 DIAGNOSIS — D259 Leiomyoma of uterus, unspecified: Secondary | ICD-10-CM

## 2012-01-02 DIAGNOSIS — N938 Other specified abnormal uterine and vaginal bleeding: Secondary | ICD-10-CM | POA: Insufficient documentation

## 2012-01-02 MED ORDER — MEGESTROL ACETATE 40 MG PO TABS: 40.0000 mg | ORAL_TABLET | Freq: Every day | ORAL | Status: AC

## 2012-01-02 NOTE — Telephone Encounter (Signed)
Need to schedule pelvic ultrasound and call patient with appointment, also need to schedule labwork to get  CBC , TSH  On same day of pelvic ultrasound either before or after appt and notify pt.

## 2012-01-02 NOTE — Progress Notes (Signed)
  Subjective:    Patient ID: Gina Wise, female    DOB: 1961/06/12, 51 y.o.   MRN: [id number]  HPI Patient seen for dysfunctional uterine bleeding.  Has having bleeding for 2-3 weeks with 1 week in between.  Does have history of hypothyroidism, which has been difficult to control in the past.  Last TSH was in 12/12, which was approximately 0.7.  Also has history of fibroid uterus.  Has not had a recent ultrasound.  Has to wear super tampon with several pads.  Changing pads frequently.   Review of Systems  Constitutional: Negative for fever, chills, fatigue and unexpected weight change.  Gastrointestinal: Negative for nausea, vomiting and diarrhea.  Genitourinary: Positive for vaginal bleeding. Negative for dysuria, frequency, vaginal discharge, vaginal pain and menstrual problem.       Objective:   Physical Exam  Constitutional: She is oriented to person, place, and time. She appears well-developed and well-nourished.  Abdominal: Soft. Bowel sounds are normal. She exhibits no distension and no mass. There is no tenderness. There is no rebound and no guarding.  Neurological: She is alert and oriented to person, place, and time.  Psychiatric: She has a normal mood and affect. Her behavior is normal. Judgment and thought content normal.      Assessment & Plan:  1.  DUB Patient decline pelvic exam.  Will get TSH to assure levothyroxine in normal range.  Will also check CBC.  Will check [place] to re-evaluate fibroids.  Will give patient Megace to help control bleeding.

## 2012-01-02 NOTE — Patient Instructions (Signed)
Please get ultrasound and lab work done at your earliest convenience.   Please return in approximately 4 weeks to discuss the results.

## 2012-01-03 ENCOUNTER — Other Ambulatory Visit: Payer: Self-pay | Admitting: Family Medicine

## 2012-01-03 ENCOUNTER — Encounter: Payer: Self-pay | Admitting: Advanced Practice Midwife

## 2012-01-03 DIAGNOSIS — N938 Other specified abnormal uterine and vaginal bleeding: Secondary | ICD-10-CM

## 2012-01-03 NOTE — Telephone Encounter (Signed)
Ultrasound appointment made for 01/09/12 at 0900. Pt can come to clinic that day for her CBC and TSH

## 2012-01-03 NOTE — Telephone Encounter (Signed)
Pt notified of her appointments and also an appointment to followup was scheduled.

## 2012-01-07 ENCOUNTER — Other Ambulatory Visit: Payer: Self-pay | Admitting: Family Medicine

## 2012-01-07 DIAGNOSIS — J329 Chronic sinusitis, unspecified: Secondary | ICD-10-CM

## 2012-01-07 MED ORDER — SERTRALINE HCL 100 MG PO TABS: 100.0000 mg | ORAL_TABLET | Freq: Every day | ORAL | Status: DC

## 2012-01-07 MED ORDER — LEVOTHYROXINE SODIUM 88 MCG PO TABS: 88.0000 ug | ORAL_TABLET | Freq: Every day | ORAL | Status: DC

## 2012-01-07 MED ORDER — FLUTICASONE PROPIONATE 50 MCG/ACT NA SUSP: NASAL | Status: DC

## 2012-01-07 MED ORDER — HYDROCHLOROTHIAZIDE 25 MG PO TABS: 25.0000 mg | ORAL_TABLET | Freq: Every day | ORAL | Status: DC

## 2012-01-07 NOTE — Telephone Encounter (Signed)
Refil requests for different pharmacy

## 2012-01-09 ENCOUNTER — Ambulatory Visit (HOSPITAL_COMMUNITY): Admission: RE | Admit: 2012-01-09 | Payer: 59 | Source: Ambulatory Visit

## 2012-01-09 ENCOUNTER — Other Ambulatory Visit: Payer: 59

## 2012-01-15 ENCOUNTER — Ambulatory Visit (HOSPITAL_COMMUNITY): Payer: 59

## 2012-01-15 ENCOUNTER — Other Ambulatory Visit: Payer: 59

## 2012-01-30 ENCOUNTER — Ambulatory Visit: Payer: 59 | Admitting: Advanced Practice Midwife

## 2012-02-01 ENCOUNTER — Ambulatory Visit: Payer: 59 | Admitting: Obstetrics and Gynecology

## 2012-02-07 ENCOUNTER — Telehealth: Payer: Self-pay | Admitting: Family Medicine

## 2012-02-07 DIAGNOSIS — E039 Hypothyroidism, unspecified: Secondary | ICD-10-CM

## 2012-02-07 DIAGNOSIS — N939 Abnormal uterine and vaginal bleeding, unspecified: Secondary | ICD-10-CM

## 2012-02-07 NOTE — Telephone Encounter (Signed)
Patient is calling for a referral to see an Endocrinologist.  She would like to use Dr. Allena Katz - [facility] on [address]  She said it is so bad now that it is hard to swallow, she is choking on food, and her period is continuing.  She said she has never felt this bad.

## 2012-02-07 NOTE — Telephone Encounter (Signed)
(  Dr.Patel's number: [phone])

## 2012-02-07 NOTE — Telephone Encounter (Signed)
1. )Called pt. She reports that she has called their office, but needs referral from PCP. She needs to be seen for her thyroid. I told her, that I would asked Dr.Corey to put in the referral. Dr.Patel is in pt's network [facility]) 2.) pt had gyn appt already and upcoming [place] appt and also appt with surgeon to discuss ablation. 3.) she has her last appt with Dr.Corey on 03-05-12 and would like to know, where he would practice. Fwd. To Dr.Corey for referral. .Arlyss Repress

## 2012-02-08 ENCOUNTER — Other Ambulatory Visit: Payer: 59

## 2012-02-08 DIAGNOSIS — N939 Abnormal uterine and vaginal bleeding, unspecified: Secondary | ICD-10-CM

## 2012-02-08 DIAGNOSIS — E039 Hypothyroidism, unspecified: Secondary | ICD-10-CM

## 2012-02-08 NOTE — Progress Notes (Signed)
Tsh,f-t4 cbc done today [facility] Remedios Mckone

## 2012-02-08 NOTE — Telephone Encounter (Signed)
Called Ms Walrond back. She notes  1) Swelling of her neck. Thinks it is her thyroid. Denies any trouble breathing or actual choking but does note some subjective differences with swallowing. She would like a referral to endocrinology.  2) fatigue,  Associated with dry skin and brittle hair. Patient wonders if she is hypothyroid again.  3) irregular vaginal bleeding: Currently being evaluated by gynecology with plan for endometrial ablation versus hysterectomy. She wonders if she may be anemic.  Plan: SS TSH, free T4. Additionally will assess her thyroid with an ultrasound. Will assess for anemia with a CBC. Followup with patient on May 17 or sooner if needed.

## 2012-02-09 LAB — CBC
HCT: 39.2 % (ref 36.0–46.0)
Hemoglobin: 12.7 g/dL (ref 12.0–15.0)
MCV: 84.1 fL (ref 78.0–100.0)
RBC: 4.66 MIL/uL (ref 3.87–5.11)
WBC: 7.6 10*3/uL (ref 4.0–10.5)

## 2012-02-11 ENCOUNTER — Ambulatory Visit
Admission: RE | Admit: 2012-02-11 | Discharge: 2012-02-11 | Disposition: A | Discharge status: Home / Self Care | Payer: 59 | Source: Ambulatory Visit | Attending: Family Medicine | Admitting: Family Medicine

## 2012-02-11 ENCOUNTER — Telehealth: Payer: Self-pay | Admitting: Family Medicine

## 2012-02-11 DIAGNOSIS — E039 Hypothyroidism, unspecified: Secondary | ICD-10-CM

## 2012-02-11 MED ORDER — LEVOTHYROXINE SODIUM 100 MCG PO TABS: 100.0000 ug | ORAL_TABLET | Freq: Every day | ORAL | Status: DC

## 2012-02-11 NOTE — Telephone Encounter (Signed)
Called patient back about results.  Increased Levothyroxine to daily and will follow up in around 1 month.  US thyroid is today.

## 2012-02-11 NOTE — Telephone Encounter (Signed)
Message copied by Rodolph Bong on Mon Feb 11, 2012  8:17 AM ------      Message from: Zachery Dauer      Created: Sat Feb 09, 2012  8:15 PM                   ----- Message -----         From: [facility] Interface         Sent: 02/09/2012  12:41 AM           To: Tobin Chad, MD

## 2012-02-12 ENCOUNTER — Ambulatory Visit (HOSPITAL_COMMUNITY)
Admission: RE | Admit: 2012-02-12 | Discharge: 2012-02-12 | Disposition: A | Discharge status: Home / Self Care | Payer: 59 | Source: Ambulatory Visit | Attending: Family Medicine | Admitting: Family Medicine

## 2012-02-12 ENCOUNTER — Telehealth: Payer: Self-pay | Admitting: Family Medicine

## 2012-02-12 ENCOUNTER — Other Ambulatory Visit: Payer: 59

## 2012-02-12 DIAGNOSIS — N949 Unspecified condition associated with female genital organs and menstrual cycle: Secondary | ICD-10-CM | POA: Insufficient documentation

## 2012-02-12 DIAGNOSIS — N938 Other specified abnormal uterine and vaginal bleeding: Secondary | ICD-10-CM | POA: Insufficient documentation

## 2012-02-12 NOTE — Telephone Encounter (Signed)
Called and left a message about results.

## 2012-02-12 NOTE — Telephone Encounter (Signed)
Message copied by Rodolph Bong on Tue Feb 12, 2012  8:35 AM ------      Message from: Zachery Dauer      Created: Mon Feb 11, 2012  3:56 PM                   ----- Message -----         From: Rad Results In Interface         Sent: 02/11/2012   2:46 PM           To: Tobin Chad, MD

## 2012-02-29 ENCOUNTER — Ambulatory Visit (INDEPENDENT_AMBULATORY_CARE_PROVIDER_SITE_OTHER): Payer: 59 | Admitting: Obstetrics & Gynecology

## 2012-02-29 ENCOUNTER — Other Ambulatory Visit (HOSPITAL_COMMUNITY)
Admission: RE | Admit: 2012-02-29 | Discharge: 2012-02-29 | Disposition: A | Discharge status: Home / Self Care | Payer: 59 | Source: Ambulatory Visit | Attending: Obstetrics & Gynecology | Admitting: Obstetrics & Gynecology

## 2012-02-29 ENCOUNTER — Encounter: Payer: Self-pay | Admitting: Obstetrics & Gynecology

## 2012-02-29 VITALS — BP 127/85 | HR 97 | Temp 97.0°F | Ht 66.0 in | Wt 162.8 lb

## 2012-02-29 DIAGNOSIS — N926 Irregular menstruation, unspecified: Secondary | ICD-10-CM

## 2012-02-29 DIAGNOSIS — N949 Unspecified condition associated with female genital organs and menstrual cycle: Secondary | ICD-10-CM | POA: Insufficient documentation

## 2012-02-29 DIAGNOSIS — N938 Other specified abnormal uterine and vaginal bleeding: Secondary | ICD-10-CM | POA: Insufficient documentation

## 2012-02-29 DIAGNOSIS — N840 Polyp of corpus uteri: Secondary | ICD-10-CM | POA: Insufficient documentation

## 2012-02-29 DIAGNOSIS — N939 Abnormal uterine and vaginal bleeding, unspecified: Secondary | ICD-10-CM

## 2012-02-29 MED ORDER — DICLOFENAC SODIUM 75 MG PO TBEC: 75.0000 mg | DELAYED_RELEASE_TABLET | Freq: Two times a day (BID) | ORAL | Status: DC

## 2012-02-29 NOTE — Progress Notes (Signed)
Patient ID: Gina Wise, female   DOB: 09/12/1961, 51 y.o.   MRN: [id number]  Abnormal l Uterine Bleeding Patient presents to follow up of irregular menses.  Since her last office visit she has had daily bleeding with clots and intermittent cramping. She had been bleeding sine December, 2012. She has started megace 2-3 days per month to lighten bleeding. The megace precipitates headaches so she cannot tolerate taking it more often.    Endometrial Biopsy Procedure Note  Pre-operative Diagnosis: abnormal uterine bleeding, thickened endometrial stripe (1.3 cm) on pelvic ultrasound  Post-operative Diagnosis: same  Indications: abnormal uterine bleeding, thickened endometrial stripe   Procedure Details  Urine pregnancy test was done in office and result was negative.  The risks (including infection, bleeding, pain, and uterine perforation) and benefits of the procedure were explained to the patient and Written informed consent was obtained.  Antibiotic prophylaxis against endocarditis was not indicated.   The patient was placed in the dorsal lithotomy position.  A Graves' speculum inserted in the vagina, and the cervix prepped with povidone iodine.  Endocervical curettage with a Kevorkian curette was not performed.   A sharp tenaculum was applied to the anterior lip of the cervix for stabilization.  A Mylex 3 mm curette as used to sound the uterus to a depth of 7cm.  A Mylex 3mm curette was used to sample the endometrium.  Sample was sent for pathologic examination.  Condition: Stable  Complications: None  Plan:  The patient was advised to call for any fever or for prolonged or severe pain or bleeding. She was advised to use NSAID as needed for mild to moderate pain.She was advised to follow-up in 2 weeks to review pathology results.   Attending Physician Documentation: Dr. Macon Large was present for or participated in the entire procedure.

## 2012-02-29 NOTE — Patient Instructions (Addendum)
Liani,  Thank you for coming in to see [place] today. For your bleeding continue the magace. For the pain: start diclofenac 75 mg twice daily. Be careful not to mix this with any other NSAIDs (motrin, advil, ibuprofen)  Please f/u in 2-3 weeks to review pathology results from your endometrial biopsy and decide on a plan of care.   For screening, please schedule your yearly mammogram. Last mammogram normal in 09/2009.  Drs. Funches and Anywanu     Dysfunctional Uterine Bleeding Normally, menstrual periods begin between ages 66 to 87 in young women. A normal menstrual cycle/period may begin every 23 days up to 35 days and lasts from 1 to 7 days. Around 12 to 14 days before your menstrual period starts, ovulation (ovary produces an egg) occurs. When counting the time between menstrual periods, count from the first day of bleeding of the previous period to the first day of bleeding of the next period. Dysfunctional (abnormal) uterine bleeding is bleeding that is different from a normal menstrual period. Your periods may come earlier or later than usual. They may be lighter, have blood clots or be heavier. You may have bleeding between periods, or you may skip one period or more. You may have bleeding after sexual intercourse, bleeding after menopause, or no menstrual period. CAUSES   Pregnancy (normal, miscarriage, tubal).   IUDs (intrauterine device, birth control).   Birth control pills.   Hormone treatment.   Menopause.   Infection of the cervix.   Blood clotting problems.   Infection of the inside lining of the uterus.   Endometriosis, inside lining of the uterus growing in the pelvis and other female organs.   Adhesions (scar tissue) inside the uterus.   Obesity or severe weight loss.   Uterine polyps inside the uterus.   Cancer of the vagina, cervix, or uterus.   Ovarian cysts or polycystic ovary syndrome.   Medical problems (diabetes, thyroid disease).   Uterine fibroids  (noncancerous tumor).   Problems with your female hormones.   Endometrial hyperplasia, very thick lining and enlarged cells inside of the uterus.   Medicines that interfere with ovulation.   Radiation to the pelvis or abdomen.   Chemotherapy.  DIAGNOSIS   Your doctor will discuss the history of your menstrual periods, medicines you are taking, changes in your weight, stress in your life, and any medical problems you may have.   Your doctor will do a physical and pelvic examination.   Your doctor may want to perform certain tests to make a diagnosis, such as:   Pap test.   Blood tests.   Cultures for infection.   CT scan.   Ultrasound.   Hysteroscopy.   Laparoscopy.   MRI.   Hysterosalpingography.   D and C.   Endometrial biopsy.  TREATMENT  Treatment will depend on the cause of the dysfunctional uterine bleeding (DUB). Treatment may include:  Observing your menstrual periods for a couple of months.   Prescribing medicines for medical problems, including:   Antibiotics.   Hormones.   Birth control pills.   Removing an IUD (intrauterine device, birth control).   Surgery:   D and C (scrape and remove tissue from inside the uterus).   Laparoscopy (examine inside the abdomen with a lighted tube).   Uterine ablation (destroy lining of the uterus with electrical current, laser, heat, or freezing).   Hysteroscopy (examine cervix and uterus with a lighted tube).   Hysterectomy (remove the uterus).  HOME CARE INSTRUCTIONS  If medicines were prescribed, take exactly as directed. Do not change or switch medicines without consulting your caregiver.   Long term heavy bleeding may result in iron deficiency. Your caregiver may have prescribed iron pills. They help replace the iron that your body lost from heavy bleeding. Take exactly as directed.   Do not take aspirin or medicines that contain aspirin one week before or during your menstrual period. Aspirin  may make the bleeding worse.   If you need to change your sanitary pad or tampon more than once every 2 hours, stay in bed with your feet elevated and a cold pack on your lower abdomen. Rest as much as possible, until the bleeding stops or slows down.   Eat well-balanced meals. Eat foods high in iron. Examples are:   Leafy green vegetables.   Whole-grain breads and cereals.   Eggs.   Meat.   Liver.   Do not try to lose weight until the abnormal bleeding has stopped and your blood iron level is back to normal. Do not lift more than ten pounds or do strenuous activities when you are bleeding.   For a couple of months, make note on your calendar, marking the start and ending of your period, and the type of bleeding (light, medium, heavy, spotting, clots or missed periods). This is for your caregiver to better evaluate your problem.  SEEK MEDICAL CARE IF:   You develop nausea (feeling sick to your stomach) and vomiting, dizziness, or diarrhea while you are taking your medicine.   You are getting lightheaded or weak.   You have any problems that may be related to the medicine you are taking.   You develop pain with your DUB.   You want to remove your IUD.   You want to stop or change your birth control pills or hormones.   You have any type of abnormal bleeding mentioned above.   You are over 73 years old and have not had a menstrual period yet.   You are 51 years old and you are still having menstrual periods.   You have any of the symptoms mentioned above.   You develop a rash.  SEEK IMMEDIATE MEDICAL CARE IF:   An oral temperature above 102 F (38.9 C) develops.   You develop chills.   You are changing your sanitary pad or tampon more than once an hour.   You develop abdominal pain.   You pass out or faint.  Document Released: 10/05/2000 Document Revised: 09/27/2011 Document Reviewed: 09/06/2009 [facility] Patient Information 2012 [place], [place].

## 2012-03-04 ENCOUNTER — Encounter: Payer: Self-pay | Admitting: Obstetrics & Gynecology

## 2012-03-06 ENCOUNTER — Telehealth: Payer: Self-pay

## 2012-03-06 NOTE — Telephone Encounter (Signed)
Called pt and pt wanted to know results of endo bx which I informed her that they were normal but to please keep her appt on 03/31/12 for her surgical consult.  Pt stated understanding and stated that she will come to her appt.

## 2012-03-12 ENCOUNTER — Ambulatory Visit (INDEPENDENT_AMBULATORY_CARE_PROVIDER_SITE_OTHER): Payer: 59 | Admitting: Family Medicine

## 2012-03-12 ENCOUNTER — Encounter: Payer: Self-pay | Admitting: Family Medicine

## 2012-03-12 VITALS — BP 126/83 | HR 83 | Temp 98.0°F | Ht 66.0 in | Wt 165.0 lb

## 2012-03-12 DIAGNOSIS — N938 Other specified abnormal uterine and vaginal bleeding: Secondary | ICD-10-CM

## 2012-03-12 DIAGNOSIS — N926 Irregular menstruation, unspecified: Secondary | ICD-10-CM

## 2012-03-12 DIAGNOSIS — R5381 Other malaise: Secondary | ICD-10-CM

## 2012-03-12 DIAGNOSIS — R5383 Other fatigue: Secondary | ICD-10-CM

## 2012-03-12 DIAGNOSIS — E611 Iron deficiency: Secondary | ICD-10-CM

## 2012-03-12 LAB — FERRITIN: Ferritin: 5 ng/mL — ABNORMAL LOW (ref 10–291)

## 2012-03-12 LAB — IRON AND TIBC
%SAT: 5 % — ABNORMAL LOW (ref 20–55)
TIBC: 401 ug/dL (ref 250–470)

## 2012-03-12 MED ORDER — FERROUS SULFATE 325 (65 FE) MG PO TABS: 325.0000 mg | ORAL_TABLET | Freq: Three times a day (TID) | ORAL | Status: DC

## 2012-03-12 NOTE — Patient Instructions (Signed)
Thank you for coming in today. I think you may be iron deficient.  Please take the iron 3x a day.  We will do some labs today to try to figure out why you feel bad.  Let me know if you are getting worse. This may also be a return of some depression.  Try to take the iron 3x a day or at least 1 or 2. 3 is better.

## 2012-03-13 ENCOUNTER — Telehealth: Payer: Self-pay | Admitting: Family Medicine

## 2012-03-13 ENCOUNTER — Encounter: Payer: Self-pay | Admitting: Family Medicine

## 2012-03-13 DIAGNOSIS — E611 Iron deficiency: Secondary | ICD-10-CM | POA: Insufficient documentation

## 2012-03-13 LAB — CBC
Hemoglobin: 12 g/dL (ref 12.0–15.0)
MCH: 26.5 pg (ref 26.0–34.0)
MCV: 81 fL (ref 78.0–100.0)
RBC: 4.52 MIL/uL (ref 3.87–5.11)

## 2012-03-13 NOTE — Telephone Encounter (Signed)
Patient states she is feeling very lightheaded, wobbly and stumbled this AM, having a little shortness of breath . Dr. Denyse Amass notified and he spoke with patient on the phone.

## 2012-03-13 NOTE — Telephone Encounter (Signed)
Spoke with patient.  She denies any chest pain or palpitations.  I discussed that this may be hypotension or dehydration.  Advised going home and checking blood pressure.  I recommended taking 1/2 BP pill or no pill.  If symptoms do not improve or worsen I recommend going to the ER for further evaluation.  Pt expresses understanding.

## 2012-03-13 NOTE — Assessment & Plan Note (Signed)
Likely secondary to iron deficiency anemia. TSH is also been evaluated and found to be normal recently. Plan to treat iron deficiency anemia discussed in a separate problem. Will reevaluate in one month if not feeling better we'll consider augmentation of antidepression medications

## 2012-03-13 NOTE — Assessment & Plan Note (Signed)
Gina Wise is significantly iron deficient with a ferritin of 5 and a TIBC saturation of 5%.  Her hemoglobin is stable around 12 with an MCV of 80.   She has not yet anemic from her iron deficiency but will become anemic  Soon.   Ultimately the right treatment is hysterectomy for dysfunctional uterine bleeding.   In the meantime if we are not able to stop her vaginal bleeding secondary to medication side effects I believe that iron supplementation as the right choice.   We'll start with iron sulfate 3 times a day as tolerated and followup in a few weeks if not feeling any better.  I expect her iron stores to rapidly improve with oral iron supplementation.  If they do not I would consider outpatient iron infusion prior to hysterectomy.

## 2012-03-13 NOTE — Telephone Encounter (Signed)
Pt called back to say that her BP reading was 138/90 (lowest in the 20 minutes she has been taking it)

## 2012-03-13 NOTE — Progress Notes (Signed)
Gina Wise is a 51 y.o. female who presents to [facility] today for   1) fatigue: Patient has significant dysfunctional uterine bleeding and feels very fatigued since that started.  Over the last several weeks she has been very tired and has noted or loss of appetite.  She denies any chest pains palpitations or depression symptoms.  She does note some anhedonia.   She is taking her levothyroxine and antidepression medicines and doing well.  She denies any shortness of breath or edema.    PHQ9 is 15 significant for fatigue symptoms.  2) vaginal bleeding: Patient has dysfunctional uterine bleeding. She is currently being managed by [facility] hospital OB[facility]GYN clinic.  She has a hysterectomy planned for July.  In the interim she cannot tolerate Megace or birth control pills because they cause migraines.  She notes daily vaginal bleeding.  She denies any lightheadedness.     PMH: Reviewed significant for depression and hypertension History  Substance Use Topics  . Smoking status: Never Smoker   . Smokeless tobacco: Not on file  . Alcohol Use: No   ROS as above  Medications reviewed. Current Outpatient Prescriptions  Medication Sig Dispense Refill  . diclofenac (VOLTAREN) 75 MG EC tablet Take 1 tablet (75 mg total) by mouth 2 (two) times daily.  60 tablet  2  . fluticasone (FLONASE) 50 MCG/ACT nasal spray 2 sprays into each nostril daily  16 g  6  . hydrochlorothiazide (HYDRODIURIL) 25 MG tablet Take 1 tablet (25 mg total) by mouth daily.  90 tablet  4  . levothyroxine (SYNTHROID, LEVOTHROID) 100 MCG tablet Take 1 tablet (100 mcg total) by mouth daily.  90 tablet  4  . loratadine (CLARITIN) 10 MG tablet Take 10 mg by mouth daily.        . Multiple Vitamins-Minerals (WOMENS MULTIVITAMIN PLUS) TABS Take 1 tablet by mouth daily.        . promethazine (PHENERGAN) 12.5 MG tablet TAKE ONE TABLET BY MOUTH EVERY EIGHT HOURS AS NEEDED FOR NAUSEA  20 tablet  1  . rizatriptan (MAXALT) 10 MG tablet Take 1 tablet (10  mg total) by mouth once as needed for migraine. May repeat in 2 hours if needed  30 tablet  2  . sertraline (ZOLOFT) 100 MG tablet Take 1 tablet (100 mg total) by mouth daily.  90 tablet  4  . ferrous sulfate 325 (65 FE) MG tablet Take 1 tablet (325 mg total) by mouth 3 (three) times daily with meals.  90 tablet  11  . megestrol (MEGACE) 40 MG tablet         Exam:  BP 126/83  Pulse 83  Temp(Src) 98 F (36.7 C) (Oral)  Ht 5\' 6"  (1.676 m)  Wt 165 lb (74.844 kg)  BMI 26.63 kg/m2  LMP 10/06/2011 Gen: Well NAD HEENT: EOMI,  MMM Lungs: CTABL Nl WOB Heart: RRR no MRG Abd: NABS, NT, ND Exts: Non edematous BL  LE, warm and well perfused.  Psych: Affect is somewhat flat but speech and thought process are normal no SI/HI  Labs reviewed the following day Results for orders placed in visit on 03/12/12 (from the past 72 hour(s))  CBC     Status: Normal   Collection Time   03/12/12  4:07 PM      Component Value Range Comment   WBC 6.7  4.0 - 10.5 (K/uL)    RBC 4.52  3.87 - 5.11 (MIL/uL)    Hemoglobin 12.0  12.0 - 15.0 (g/dL)  HCT 36.6  36.0 - 46.0 (%)    MCV 81.0  78.0 - 100.0 (fL)    MCH 26.5  26.0 - 34.0 (pg)    MCHC 32.8  30.0 - 36.0 (g/dL)    RDW 96.0  45.4 - 09.8 (%)    Platelets 351  150 - 400 (K/uL)   FERRITIN     Status: Abnormal   Collection Time   03/12/12  4:07 PM      Component Value Range Comment   Ferritin 5 (*) 10 - 291 (ng/mL)   IRON AND TIBC     Status: Abnormal   Collection Time   03/12/12  4:07 PM      Component Value Range Comment   Iron 19 (*) 42 - 145 (ug/dL)    UIBC 119  147 - 829 (ug/dL)    TIBC 562  130 - 865 (ug/dL)    %SAT 5 (*) 20 - 55 (%)   VITAMIN B12     Status: Normal   Collection Time   03/12/12  4:07 PM      Component Value Range Comment   Vitamin B-12 522  211 - 911 (pg/mL)

## 2012-03-13 NOTE — Telephone Encounter (Signed)
Pt called to say she is very wobbly and doesn't feel well.  Was here yesterday and Dr Denyse Amass called her this morning and told her that her iron level was extremely low.  She is wanting to speak to nurse - has a few questions.

## 2012-03-14 ENCOUNTER — Telehealth: Payer: Self-pay | Admitting: Family Medicine

## 2012-03-14 NOTE — Telephone Encounter (Signed)
Patient is calling to ask how soon her Iron can be checked to see if the medication is working.  The Ferrous Sulfate makes her feel very nauseated and she has heartburn.

## 2012-03-14 NOTE — Telephone Encounter (Signed)
Called back and spoke with patient . States she continues feeling very bad. States she is very impatient for iron to work. Explained that probably will not check iron levels again for  one month , two weeks at the least . Will ask Dr. Denyse Amass when he wants her to return.  . States her BP today was 133/86 pulse 96. Denies chest pain but states she does have discomfort and both sides of ribs.  Has not taken BP med today yet. Offered to schedule appointment this afternoon  to evaluate , but she has no way to come , she is afraid to drive herself and has no one to bring her. Advised if continuing to feel bad go  to  ED as soon as she has transportation.   Advised to be sure to take iron with food.

## 2012-03-18 ENCOUNTER — Emergency Department (HOSPITAL_COMMUNITY)
Admission: EM | Admit: 2012-03-18 | Discharge: 2012-03-18 | Disposition: A | Discharge status: Home / Self Care | Payer: 59 | Attending: Emergency Medicine | Admitting: Emergency Medicine

## 2012-03-18 ENCOUNTER — Encounter (HOSPITAL_COMMUNITY): Payer: Self-pay | Admitting: Emergency Medicine

## 2012-03-18 DIAGNOSIS — I10 Essential (primary) hypertension: Secondary | ICD-10-CM | POA: Insufficient documentation

## 2012-03-18 DIAGNOSIS — N920 Excessive and frequent menstruation with regular cycle: Secondary | ICD-10-CM | POA: Insufficient documentation

## 2012-03-18 DIAGNOSIS — F329 Major depressive disorder, single episode, unspecified: Secondary | ICD-10-CM | POA: Insufficient documentation

## 2012-03-18 DIAGNOSIS — E039 Hypothyroidism, unspecified: Secondary | ICD-10-CM | POA: Insufficient documentation

## 2012-03-18 DIAGNOSIS — G43909 Migraine, unspecified, not intractable, without status migrainosus: Secondary | ICD-10-CM | POA: Insufficient documentation

## 2012-03-18 DIAGNOSIS — F3289 Other specified depressive episodes: Secondary | ICD-10-CM | POA: Insufficient documentation

## 2012-03-18 DIAGNOSIS — R109 Unspecified abdominal pain: Secondary | ICD-10-CM | POA: Insufficient documentation

## 2012-03-18 LAB — URINALYSIS, ROUTINE W REFLEX MICROSCOPIC
Protein, ur: NEGATIVE mg/dL
Urobilinogen, UA: 0.2 mg/dL (ref 0.0–1.0)

## 2012-03-18 LAB — COMPREHENSIVE METABOLIC PANEL
ALT: 10 U/L (ref 0–35)
AST: 16 U/L (ref 0–37)
CO2: 21 mEq/L (ref 19–32)
Chloride: 101 mEq/L (ref 96–112)
GFR calc non Af Amer: >90 mL/min (ref >90)
Sodium: 137 mEq/L (ref 135–145)
Total Bilirubin: 0.3 mg/dL (ref 0.3–1.2)

## 2012-03-18 LAB — CBC
MCV: 81.4 fL (ref 78.0–100.0)
Platelets: 229 10*3/uL (ref 150–400)
RBC: 4.9 MIL/uL (ref 3.87–5.11)
WBC: 9.5 10*3/uL (ref 4.0–10.5)

## 2012-03-18 NOTE — ED Notes (Signed)
Pt has vaginal bleeding since dec and is schedule for consult for 6/12 "but im not going to be able to make it". Obgyn is MD A, states that her iron has been low of 5 and that she feels weak.

## 2012-03-18 NOTE — ED Provider Notes (Signed)
History     CSN: [id number]  Arrival date & time 03/18/12  1311   First MD Initiated Contact with Patient 03/18/12 1811      Chief Complaint  Patient presents with  . Abdominal Pain  . Vaginal Bleeding    HPI Patient has been having trouble with vaginal bleeding since December.  She has seen her primary Dr. and had an outpatient workup including pelvic exam, endometrial biopsy, and pelvic ultrasound. Patient is scheduled to see a gynecologist on June 12 .  Patient tried to call her doctor today but was unable to get an appointment. She was told that she felt worse to come to the emergency room. Patient states she's been feeling weaker and is worried about her iron. Patient was told that high level was 5 in the past.  Patient denies any fever, cough, vomiting or diarrhea. She states her vaginal bleeding has not changed since December and it has been constant. Past Medical History  Diagnosis Date  . Hypothyroid   . Depression   . Overweight   . Migraines   . Hypertension   . Peri-menopause     Past Surgical History  Procedure Date  . Appendectomy   . Tonsillectomy   . Tubal ligation   . Inguinal hernia repair   . Rotator cuff repair     left  . Shoulder surgery     Family History  Problem Relation Age of Onset  . Hypertension Father   . Emphysema Father   . Vision loss Father   . Alcohol abuse Father   . Hypertension Mother   . Heart disease Mother   . Hepatitis Mother     Hep C    History  Substance Use Topics  . Smoking status: Never Smoker   . Smokeless tobacco: Not on file  . Alcohol Use: No    OB History    Grav Para Term Preterm Abortions TAB SAB Ect Mult Living   2 2 2       2       Review of Systems  All other systems reviewed and are negative.    Allergies  Codeine and Erythromycin  Home Medications   Current Outpatient Rx  Name Route Sig Dispense Refill  . FERROUS SULFATE 325 (65 FE) MG PO TABS Oral Take 325 mg by mouth 3 (three) times  daily with meals.    Marland Kitchen FLUTICASONE PROPIONATE 50 MCG/ACT NA SUSP  2 sprays into each nostril daily    . HYDROCHLOROTHIAZIDE 25 MG PO TABS Oral Take 25 mg by mouth daily.    Marland Kitchen LEVOTHYROXINE SODIUM 100 MCG PO TABS Oral Take 100 mcg by mouth daily.    Marland Kitchen LORATADINE 10 MG PO TABS Oral Take 10 mg by mouth daily.      . WOMENS MULTIVITAMIN PLUS PO TABS Oral Take 1 tablet by mouth daily.      Marland Kitchen PROMETHAZINE HCL 12.5 MG PO TABS      . RIZATRIPTAN BENZOATE 10 MG PO TABS Oral Take 10 mg by mouth once as needed. May repeat in 2 hours if needed    . SERTRALINE HCL 100 MG PO TABS Oral Take 100 mg by mouth daily.      BP 125/71  Pulse 71  Temp(Src) 98.7 F (37.1 C) (Oral)  Resp 16  SpO2 100%  Physical Exam  Nursing note and vitals reviewed. Constitutional: She appears well-developed and well-nourished. No distress.  HENT:  Head: Normocephalic and atraumatic.  Right Ear:  External ear normal.  Left Ear: External ear normal.  Eyes: Conjunctivae are normal. Right eye exhibits no discharge. Left eye exhibits no discharge. No scleral icterus.  Neck: Neck supple. No tracheal deviation present.  Cardiovascular: Normal rate, regular rhythm and intact distal pulses.   Pulmonary/Chest: Effort normal and breath sounds normal. No stridor. No respiratory distress. She has no wheezes. She has no rales.  Abdominal: Soft. Bowel sounds are normal. She exhibits no distension. There is no tenderness. There is no rebound and no guarding.  Musculoskeletal: She exhibits no edema and no tenderness.  Neurological: She is alert. She has normal strength. No sensory deficit. Cranial nerve deficit:  no gross defecits noted. She exhibits normal muscle tone. She displays no seizure activity. Coordination normal.  Skin: Skin is warm and dry. No rash noted.  Psychiatric: She has a normal mood and affect.    ED Course  Procedures (including critical care time)  Rate: 70  Rhythm: normal sinus rhythm  QRS Axis: normal   Intervals: normal  ST/T Wave abnormalities: normal  Conduction Disutrbances:none  Narrative Interpretation:   Old EKG Reviewed: none available  Labs Reviewed  URINALYSIS, ROUTINE W REFLEX MICROSCOPIC - Abnormal; Notable for the following:    APPearance CLOUDY (*)    Hgb urine dipstick LARGE (*)    Bilirubin Urine SMALL (*)    All other components within normal limits  URINE MICROSCOPIC-ADD ON - Abnormal; Notable for the following:    Squamous Epithelial / LPF FEW (*)    All other components within normal limits  CBC  COMPREHENSIVE METABOLIC PANEL   No results found.  1. Menorrhagia      MDM  Pelvic exam was deferred today. Patient states she's had a pelvic exam several times for this complaint and .nothing particularly changed.  Patient does not have any evidence of anemia. She is in the process of getting an outpatient workup for her persistent vaginal bleeding. At this point there does not appear to be any evidence of emergency medical condition.I asked the patient about hormonal treatment and she states she cannot take birth control pills because it causes her to have migraines.       Celene Kras, MD 03/18/12 [phone]

## 2012-03-18 NOTE — ED Notes (Signed)
MD at bedside. 

## 2012-03-18 NOTE — Discharge Instructions (Signed)
Menorrhagia  Menorrhagia is when a menstrual period is heavier or longer than normal. HOME CARE  Only take medicine as told by your doctor.   Do not take aspirin 1 week before or during your period. Aspirin can make the bleeding worse.   Lay down for a while if you change your tampon or pad more than once in 2 hours. This may help lessen the bleeding.   Take any iron pills as told by your doctor. Heavy bleeding may cause you to lack iron in your body.   Eat a healthy diet and foods with iron. These foods include leafy green vegetables, meat, liver, eggs, and whole grain breads and cereals.   Eat foods that are high in vitamin C. These include oranges, orange juice, and grapefruits. Vitamin C can help your body take in more iron.   Do not try to lose weight. Wait until the heavy bleeding has stopped and your iron level is normal.  GET HELP RIGHT AWAY IF:  You get a fever.   You have trouble breathing.   You bleed even more heavily than usual and pass blood clots.   You need to change your tampon or pad more than once an hour.   You feel sick to your stomach (nauseous), throw up (vomit), or have watery poop (diarrhea).   You have problems from medicine.  MAKE SURE YOU:   Understand these instructions.   Will watch your condition.   Will get help right away if you are not doing well or get worse.  Document Released: 07/17/2008 Document Revised: 09/27/2011 Document Reviewed: 07/17/2008 [facility] Patient Information 2012 [place], [place].

## 2012-03-18 NOTE — ED Notes (Addendum)
Pt reports having heavy vaginal bleeding with large blood clots since dec and since has been gradually feeling more weaker.  Last week the dr reported iron 5 and no iron stores.  C/o of generalized muscleaches.

## 2012-03-19 ENCOUNTER — Telehealth: Payer: Self-pay

## 2012-03-19 ENCOUNTER — Telehealth: Payer: Self-pay | Admitting: Family Medicine

## 2012-03-19 NOTE — Telephone Encounter (Signed)
Pt wants to discuss ST disability as she does not like how she feels on Iron. Does not have any constipation issues-makes her sick to stomach. (takes w/food) Has appt at [facility] June 12 for consult for surgery.

## 2012-03-19 NOTE — Telephone Encounter (Signed)
Pt called and informed me that she has an appt at the clinics on 04/02/12 for surgical consult for hysterectomy and wanted to know if there was a sooner appt.  I informed pt that that is the soonest appt we have for her to speak with a surgeon.  She also informed me that from PCP she was informed that she had low iron and was started on a iron regimen.  Reading pts chart she has had same c/o with her PCP and was informed to take her iron as prescribed and with meals.  Pt questioned why is she taking the iron when she has been bleeding for 5 months.  I informed pt that due to her having low iron that its important that she takes the iron supplement to help replenish the iron and to help with alleviating her fatigue.  I advised pt to go to [facility] if any symptoms that she is having has worsen.  Pt stated understanding and had no further questions.

## 2012-03-19 NOTE — Telephone Encounter (Signed)
Is asking for Dr Denyse Amass to call her - today

## 2012-03-20 NOTE — Telephone Encounter (Signed)
ST disability filled out. Will follow.

## 2012-03-25 ENCOUNTER — Other Ambulatory Visit: Payer: Self-pay | Admitting: Family Medicine

## 2012-03-28 ENCOUNTER — Ambulatory Visit
Admission: RE | Admit: 2012-03-28 | Discharge: 2012-03-28 | Disposition: A | Discharge status: Home / Self Care | Payer: 59 | Source: Ambulatory Visit | Attending: Family Medicine | Admitting: Family Medicine

## 2012-03-28 ENCOUNTER — Ambulatory Visit (INDEPENDENT_AMBULATORY_CARE_PROVIDER_SITE_OTHER): Payer: 59 | Admitting: Family Medicine

## 2012-03-28 ENCOUNTER — Encounter: Payer: Self-pay | Admitting: Family Medicine

## 2012-03-28 VITALS — BP 123/81 | HR 94 | Ht 66.0 in | Wt 172.0 lb

## 2012-03-28 DIAGNOSIS — R5383 Other fatigue: Secondary | ICD-10-CM

## 2012-03-28 DIAGNOSIS — R5381 Other malaise: Secondary | ICD-10-CM

## 2012-03-28 DIAGNOSIS — E611 Iron deficiency: Secondary | ICD-10-CM

## 2012-03-28 DIAGNOSIS — D509 Iron deficiency anemia, unspecified: Secondary | ICD-10-CM

## 2012-03-28 DIAGNOSIS — N938 Other specified abnormal uterine and vaginal bleeding: Secondary | ICD-10-CM

## 2012-03-28 DIAGNOSIS — N949 Unspecified condition associated with female genital organs and menstrual cycle: Secondary | ICD-10-CM

## 2012-03-28 NOTE — Patient Instructions (Signed)
Thank you for coming in today. We will do the finger stick today.  We will also do a Chest Xray.  See the GYN doctor on the 12th.  If they need any additional work up let me know.  Call or go to the emergency room if you get worse, have trouble breathing, have chest pains, or palpitations.

## 2012-03-31 NOTE — Assessment & Plan Note (Signed)
Hb improving on oral iron. Will follow.

## 2012-03-31 NOTE — Assessment & Plan Note (Signed)
Fatigue with hypothyroid and iron deficiency.  TSH normal and Hb improving on oral iron. No cardiac symptoms.  Will continue treating iron. Will check a CXR just to be sure no other explanation. Will f/u in a few weeks if no better.

## 2012-03-31 NOTE — Progress Notes (Signed)
Gina Wise is a 51 y.o. female who presents to [facility] today for   1) Bleeding: Continues to experience vaginal bleeding. Has an appt for a hysterectomy next week. Would like the uterus removed ASAP as she cannot tolerate the medications to stabilize the endometrium.   2) Iron Deficiency: Is taking the iron pills between 1-3 x daily. Feels nauseated sometimes.  Would like Hb checked.   3) Fatigue: Continues to be considerably fatigued. Becomes fatigued with exertion. Denies any CP, Palpitations, edema or leg swelling. Had a normal EKG recently. No FH for CAD.    PMH: Reviewed Sig for HTN and iron defiency History  Substance Use Topics  . Smoking status: Never Smoker   . Smokeless tobacco: Not on file  . Alcohol Use: No   ROS as above  Medications reviewed. Current Outpatient Prescriptions  Medication Sig Dispense Refill  . ferrous sulfate 325 (65 FE) MG tablet Take 325 mg by mouth 3 (three) times daily with meals.      . fluticasone (FLONASE) 50 MCG/ACT nasal spray 2 sprays into each nostril daily      . hydrochlorothiazide (HYDRODIURIL) 25 MG tablet Take 25 mg by mouth daily.      Marland Kitchen levothyroxine (SYNTHROID, LEVOTHROID) 100 MCG tablet Take 100 mcg by mouth daily.      Marland Kitchen loratadine (CLARITIN) 10 MG tablet TAKE ONE TABLET BY MOUTH ONE TIME DAILY  30 tablet  9  . Multiple Vitamins-Minerals (WOMENS MULTIVITAMIN PLUS) TABS Take 1 tablet by mouth daily.        . promethazine (PHENERGAN) 12.5 MG tablet       . rizatriptan (MAXALT) 10 MG tablet Take 10 mg by mouth once as needed. May repeat in 2 hours if needed      . sertraline (ZOLOFT) 100 MG tablet Take 100 mg by mouth daily.        Exam:  BP 123/81  Pulse 94  Ht 5\' 6"  (1.676 m)  Wt 172 lb (78.019 kg)  BMI 27.76 kg/m2  LMP 03/28/2012 Gen: Well NAD HEENT: EOMI,  MMM Lungs: CTABL Nl WOB Heart: RRR no MRG Abd: NABS, NT, ND Exts: Non edematous BL  LE, warm and well perfused. Legs are symmetric.   Results for orders placed in visit  on 03/28/12 (from the past 72 hour(s))  POCT HEMOGLOBIN     Status: Normal   Collection Time   03/28/12  3:54 PM      Component Value Range Comment   Hemoglobin 13.3  12.2 - 16.2 (g/dL) CAPILLARY SAMPLE     Dg Chest 2 View  03/28/2012  *RADIOLOGY REPORT*  Clinical Data: Fatigue and weakness with some shortness of breath  CHEST - 2 VIEW  Comparison: 02/23/2011  Findings: Heart and mediastinal contours are within normal limits. Linear scarring is again seen at the left lung base.  The lung fields are otherwise clear with no signs of focal infiltrate or congestive failure.  No pleural fluid or significant peribronchial cuffing is noted.  Bony structures appear intact.  IMPRESSION: Stable cardiopulmonary appearance with no new focal or acute abnormality seen.  Original Report Authenticated By: Bertha Stakes, M.D.

## 2012-03-31 NOTE — Assessment & Plan Note (Signed)
Has an appt this week for planning for hysterectomy.

## 2012-04-02 ENCOUNTER — Encounter: Payer: Self-pay | Admitting: Obstetrics & Gynecology

## 2012-04-02 ENCOUNTER — Ambulatory Visit (INDEPENDENT_AMBULATORY_CARE_PROVIDER_SITE_OTHER): Payer: 59 | Admitting: Obstetrics & Gynecology

## 2012-04-02 VITALS — BP 136/85 | HR 84 | Temp 97.5°F | Resp 20 | Ht 66.0 in | Wt 172.0 lb

## 2012-04-02 DIAGNOSIS — N949 Unspecified condition associated with female genital organs and menstrual cycle: Secondary | ICD-10-CM

## 2012-04-02 DIAGNOSIS — N938 Other specified abnormal uterine and vaginal bleeding: Secondary | ICD-10-CM

## 2012-04-02 NOTE — Progress Notes (Signed)
  Subjective:    Patient ID: Gina Wise, female    DOB: 24-Dec-1960, 51 y.o.   MRN: [id number]  HPI  Gina Wise comes in today telling me multiple times that she needs a "hysterectomy yesterday". I discussed with her that her ultrasound is normal, EMBX normal, Hbg 13.3 on 03/28/12. I have offered her a Mirena, d&c, endometrial ablation. She started crying and told me "My short term disability hinges on having a hysterectomy asap". She says she does NOT want any other treatment options. I have explained that there are risks of surgery and that I was unwilling to do a hysterectomy at this time with her lack of documentable pathology.  Review of Systems     Objective:   Physical Exam        Assessment & Plan:

## 2012-04-03 ENCOUNTER — Ambulatory Visit (INDEPENDENT_AMBULATORY_CARE_PROVIDER_SITE_OTHER): Payer: 59 | Admitting: Gynecology

## 2012-04-03 ENCOUNTER — Encounter: Payer: Self-pay | Admitting: Gynecology

## 2012-04-03 ENCOUNTER — Other Ambulatory Visit: Payer: Self-pay | Admitting: Family Medicine

## 2012-04-03 VITALS — BP 126/90 | Ht 66.0 in | Wt 172.0 lb

## 2012-04-03 DIAGNOSIS — N926 Irregular menstruation, unspecified: Secondary | ICD-10-CM

## 2012-04-03 DIAGNOSIS — N920 Excessive and frequent menstruation with regular cycle: Secondary | ICD-10-CM

## 2012-04-03 NOTE — Progress Notes (Addendum)
51 year old G2 P2 female status post laparoscopic tubal sterilization presents to discuss menorrhagia/irregular menses history. Patient has a lifelong history of heavy menses including bleedthrough episodes despite double protection which has been socially unacceptable leading to changes in planned events. Most recently since December she has been bleeding on and off almost daily again with bleedthrough episodes or light staining. She was worked up for iron deficiency anemia has been placed on iron 3 times daily with a recovery of her hemoglobin to a most recent value of 13. Evaluation at [facility] showed a normal-appearing uterus and ovaries with a endometrial echo of 1.3 cm. An endometrial biopsy showed fragments of benign endometrial-type polyp. Her mother and sister both have a history of endometriosis.  Patient had discussed options with the clinic staff and they had recommended endometrial ablation and the patient felt uncomfortable with this and wanted a second opinion.  Exam was [name] chaperone present Abdomen soft nontender without masses guarding rebound organomegaly. Pelvic external BUS vagina normal. Cervix normal with good descent. Uterus normal size midline mobile axial to retroverted. Adnexa without masses or tenderness. Rectovaginal exam is normal.  Assessment and plan: 51 year-old with lifelong history of menorrhagia, double protection, bleedthrough episodes, socially unacceptable. Most recently with prolonged atypical bleeding. Ultrasound is normal. Endometrial sampling shows fragments of endometrial polyp. She does have a history of migraines and is unable to tolerate oral contraceptives or progesterone only such as Megace which precipitates a migraine headache. I discussed options with her to include observation, attempts at other hormonal manipulation, sonohysterogram for better definition of the endometrial cavity and the suspected endometrial polyp, hysteroscopy with removal of  polyp with or without same time ablation, Mirena IUD and hysterectomy. The lifelong history of her menorrhagia and family history of endometriosis certainly raises the possibility/probability of adenomyosis. The issue of age 22 and perimenopausal with average age of menopause 49 reviewed and if we would be able to temporize her through this versus not knowing when menopause will occur that she may still have issues for several years. After lengthy discussion the patient wants to proceed with hysterectomy for definitive treatment. I think this is reasonable given her lifelong history of difficulties with her periods. I think a vaginal hysterectomy would be possible with LAVH back up. She understands that at any time during the procedure I may convert to a total abdominal hysterectomy with a larger incision and larger recovery period. The ovarian conservation issue was reviewed and the options of keeping both ovaries or removing them at the time of surgery was discussed. The issue again of being perimenopausal and her age and issues of keeping her ovaries for hormone production and cardiovascular and bone support as well as decreased overall mortality but the risk of ovarian disease in the future both benign such as pain or cysts and ovarian cancer was reviewed and she would want to keep both ovaries accepting the risk of ovarian cancer in the future. What is involved with the procedure itself and the recovery was discussed and the risks of incisional complications leading to draining, closure by secondary intention, long-term issues of keloid/cosmetics and hernia formation, hemorrhage necessitating transfusion and the risks of transfusion, inadvertent injury to internal organs either immediately recognized or delay recognized including bowel, bladder, ureters, vessels and nerves necessitating major exploratory reparative surgeries and future reparative surgeries including ostomy formation, bowel resection, bladder  repair, ureteral damage repair and the issues of absolute irreversible sterility and sexuality following hysterectomy to include persistent orgasmic dysfunction and  persistent dyspareunia was all discussed understood and accepted. We'll move toward scheduling her for the surgery and she'll represent for a preoperative consult to answer any further questions.

## 2012-04-03 NOTE — Patient Instructions (Signed)
Office will contact you to schedule surgery surgery.

## 2012-04-04 ENCOUNTER — Telehealth: Payer: Self-pay | Admitting: Gynecology

## 2012-04-04 ENCOUNTER — Encounter (HOSPITAL_COMMUNITY): Payer: Self-pay | Admitting: *Deleted

## 2012-04-04 NOTE — Telephone Encounter (Signed)
Patient is calling for a Medical Release for Surgery, Hysterectomy, to be faxed to Dr. Donnella Bi Attention [phone].  This needs to happen today or Monday morning in order for her to have the surgery on Tuesday and she has Pre-op on Monday.

## 2012-04-04 NOTE — Telephone Encounter (Signed)
I called patient to schedule surgery. She requested is ASAP.  I scheduled her for Tuesday, June 18 7:30am and she will see Dr. Velvet Bathe on Monday June 17 11:30am for pre-op consult.  Patient knows that Dr. Velvet Bathe needs medical clearance from her primary care MD before surgery.  She tells me this will not be a problem as primary MD has been working with her to get her to the point she could have surgery. She is going to call him now and feels he will just fax a note over to [place].  She asked me to call her if I do not receive it by end of day.  Instructions provided and she will wait to hear from [facility].

## 2012-04-07 ENCOUNTER — Encounter: Payer: Self-pay | Admitting: Gynecology

## 2012-04-07 ENCOUNTER — Encounter (HOSPITAL_COMMUNITY): Payer: Self-pay

## 2012-04-07 ENCOUNTER — Ambulatory Visit (INDEPENDENT_AMBULATORY_CARE_PROVIDER_SITE_OTHER): Payer: 59 | Admitting: Gynecology

## 2012-04-07 ENCOUNTER — Encounter: Payer: Self-pay | Admitting: Family Medicine

## 2012-04-07 ENCOUNTER — Encounter (HOSPITAL_COMMUNITY)
Admission: RE | Admit: 2012-04-07 | Discharge: 2012-04-07 | Disposition: A | Discharge status: Home / Self Care | Payer: 59 | Source: Ambulatory Visit | Attending: Gynecology | Admitting: Gynecology

## 2012-04-07 ENCOUNTER — Telehealth: Payer: Self-pay | Admitting: Gynecology

## 2012-04-07 VITALS — BP 124/78

## 2012-04-07 DIAGNOSIS — N920 Excessive and frequent menstruation with regular cycle: Secondary | ICD-10-CM

## 2012-04-07 DIAGNOSIS — N926 Irregular menstruation, unspecified: Secondary | ICD-10-CM

## 2012-04-07 LAB — CBC
HCT: 40.6 % (ref 36.0–46.0)
MCV: 85.7 fL (ref 78.0–100.0)
RBC: 4.74 MIL/uL (ref 3.87–5.11)
WBC: 7.8 10*3/uL (ref 4.0–10.5)

## 2012-04-07 LAB — BASIC METABOLIC PANEL
BUN: 10 mg/dL (ref 6–23)
CO2: 29 mEq/L (ref 19–32)
Chloride: 99 mEq/L (ref 96–112)
Creatinine, Ser: 0.64 mg/dL (ref 0.50–1.10)

## 2012-04-07 LAB — SURGICAL PCR SCREEN: MRSA, PCR: NEGATIVE

## 2012-04-07 MED ORDER — DEXTROSE 5 % IV SOLN
1.0000 g | INTRAVENOUS | Status: AC
  Administered 2012-04-08: 1 g via INTRAVENOUS
  Filled 2012-04-07: qty 1

## 2012-04-07 NOTE — Telephone Encounter (Signed)
Message left on voicemail that note has been sent as requested.

## 2012-04-07 NOTE — Patient Instructions (Signed)
Followup for surgery is scheduled 

## 2012-04-07 NOTE — Telephone Encounter (Signed)
Patient informed that I have not yet received medical clearance note from her primary care doctor.  She will follow-up with them although she got the impression it might be this morning before they could get it to Dr. Velvet Bathe.  She has consult here at 11:30am.

## 2012-04-07 NOTE — Progress Notes (Signed)
Gina Wise 1961/08/28 [id number]   Preoperative consult  Chief complaint: menorrhagia/irregular menses  History of present illness: 51 y.o. G2 P2 female status post laparoscopic tubal sterilization presents to discuss menorrhagia/irregular menses history. Patient has a lifelong history of heavy menses including bleedthrough episodes despite double protection which has been socially unacceptable leading to changes in planned events. Most recently since December she has been bleeding on and off almost daily again with bleedthrough episodes or light staining. She was worked up for iron deficiency anemia has been placed on iron 3 times daily with a recovery of her hemoglobin to a most recent value of 13. Evaluation at [facility] showed a normal-appearing uterus and ovaries with a endometrial echo of 1.3 cm. An endometrial biopsy showed fragments of benign endometrial-type polyp. Her mother and sister both have a history of endometriosis.  She does have a history of migraines and is unable to tolerate oral contraceptives or progesterone only such as Megace which precipitates a migraine headache. I discussed options with her to include observation, attempts at other hormonal manipulation, sonohysterogram for better definition of the endometrial cavity and the suspected endometrial polyp, hysteroscopy with removal of polyp with or without same time ablation, Mirena IUD and hysterectomy. Patient has decided to proceed with hysterectomy and is admitted for attempted [facility] possible LAVH.    Past medical history,surgical history, medications, allergies, family history and social history were all reviewed and documented in the EPIC chart. ROS:  Was performed and pertinent positives and negatives are included in the history of present illness.  Exam: General: well developed, well nourished female, no acute distress HEENT: normal  Lungs: clear to auscultation without wheezing, rales or rhonchi  Cardiac:  regular rate without rubs, murmurs or gallops  Abdomen: soft, nontender without masses, guarding, rebound, organomegaly  04/03/2012 Pelvic: external bus vagina: normal   Cervix: grossly normal  Uterus: normal size, midline and mobile, nontender  Adnexa: without masses or tenderness  Rectovaginal exam within normal limits     Assessment/Plan:  51 year-old with lifelong history of menorrhagia, double protection, bleedthrough episodes, socially unacceptable. Most recently with prolonged atypical bleeding. Ultrasound is normal. Endometrial sampling shows fragments of endometrial polyp. She does have a history of migraines and is unable to tolerate oral contraceptives or progesterone only such as Megace which precipitates a migraine headache. I discussed options with her to include observation, attempts at other hormonal manipulation, sonohysterogram for better definition of the endometrial cavity and the suspected endometrial polyp, hysteroscopy with removal of polyp with or without same time ablation, Mirena IUD and hysterectomy. The lifelong history of her menorrhagia and family history of endometriosis certainly raises the possibility/probability of adenomyosis. The issue of age 36 and perimenopausal with average age of menopause 31 reviewed and if we would be able to temporize her through this versus not knowing when menopause will occur that she may still have issues for several years. After lengthy discussion the patient wants to proceed with hysterectomy for definitive treatment. I think this is reasonable given her lifelong history of difficulties with her periods. I think a vaginal hysterectomy would be possible with LAVH back up. She understands that at any time during the procedure I may convert to a total abdominal hysterectomy with a larger incision and larger recovery period. The ovarian conservation issue was reviewed and the options of keeping both ovaries or removing them at the time of surgery  was discussed. The issue again of being perimenopausal and her age and issues of keeping  her ovaries for hormone production and cardiovascular and bone support as well as decreased overall mortality but the risk of ovarian disease in the future both benign such as pain or cysts and ovarian cancer was reviewed and she wants to keep both ovaries accepting the risk of ovarian cancer in the future.  She does give me permission to remove one or both ovaries at the time of surgery if significant disease is encountered or it is my best recommendation at that time to do so. The expected intraoperative/postoperative courses as well as the recovery period was reviewed.   The risks of infection requiring prolonged antibiotics as well as hematoma/abscess formation requiring reoperation and drainage was reviewed with her.  The risks of incisional complications leading to draining, closure by secondary intention, long-term issues of keloid/cosmetics and hernia formation was reviewed.  Hemorrhage necessitating transfusion and the risks of transfusion discussed to include transfusion reaction, hepatitis, HIV, mad cow disease and other unknown entities.  Inadvertent injury to internal organs either immediately recognized or delay recognized including bowel, bladder, ureters, vessels and nerves necessitating major exploratory reparative surgeries and future reparative surgeries including ostomy formation, bowel resection, bladder repair, ureteral damage repair was discussed with her.  The issues of absolute irreversible sterility and sexuality following hysterectomy to include persistent orgasmic dysfunction and persistent dyspareunia was all discussed understood and accepted. Patient's questions were answered to her satisfaction she is ready to proceed with surgery.  Patient has received a preoperative clearance by her primary physician.     Dara Lords MD, 11:53 AM 04/07/2012

## 2012-04-07 NOTE — H&P (Signed)
Gina Wise May 16, 1961 [id number]   History and Physical    Chief complaint: menorrhagia/irregular menses  History of present illness: 51 y.o. G2 P2 female status post laparoscopic tubal sterilization presents to discuss menorrhagia/irregular menses history. Patient has a lifelong history of heavy menses including bleedthrough episodes despite double protection which has been socially unacceptable leading to changes in planned events. Most recently since December she has been bleeding on and off almost daily again with bleedthrough episodes or light staining. She was worked up for iron deficiency anemia has been placed on iron 3 times daily with a recovery of her hemoglobin to a most recent value of 13. Evaluation at [facility] showed a normal-appearing uterus and ovaries with a endometrial echo of 1.3 cm. An endometrial biopsy showed fragments of benign endometrial-type polyp. Her mother and sister both have a history of endometriosis.  She does have a history of migraines and is unable to tolerate oral contraceptives or progesterone only such as Megace which precipitates a migraine headache. I discussed options with her to include observation, attempts at other hormonal manipulation, sonohysterogram for better definition of the endometrial cavity and the suspected endometrial polyp, hysteroscopy with removal of polyp with or without same time ablation, Mirena IUD and hysterectomy. Patient has decided to proceed with hysterectomy and is admitted for attempted [facility] possible LAVH/TAH.    Past medical history,surgical history, medications, allergies, family history and social history were all reviewed and documented in the EPIC chart. ROS:  Was performed and pertinent positives and negatives are included in the history of present illness.  Exam: General: well developed, well nourished female, no acute distress HEENT: normal  Lungs: clear to auscultation without wheezing, rales or rhonchi    Cardiac: regular rate without rubs, murmurs or gallops  Abdomen: soft, nontender without masses, guarding, rebound, organomegaly  04/03/2012 Pelvic: external bus vagina: normal   Cervix: grossly normal  Uterus: normal size, midline and mobile, nontender  Adnexa: without masses or tenderness  Rectovaginal exam within normal limits     Assessment/Plan:  51 year-old with lifelong history of menorrhagia, double protection, bleedthrough episodes, socially unacceptable. Most recently with prolonged atypical bleeding. Ultrasound is normal. Endometrial sampling shows fragments of endometrial polyp. She does have a history of migraines and is unable to tolerate oral contraceptives or progesterone only such as Megace which precipitates a migraine headache. I discussed options with her to include observation, attempts at other hormonal manipulation, sonohysterogram for better definition of the endometrial cavity and the suspected endometrial polyp, hysteroscopy with removal of polyp with or without same time ablation, Mirena IUD and hysterectomy. The lifelong history of her menorrhagia and family history of endometriosis certainly raises the possibility/probability of adenomyosis. The issue of age 47 and perimenopausal with average age of menopause 57 reviewed and if we would be able to temporize her through this versus not knowing when menopause will occur that she may still have issues for several years. After lengthy discussion the patient wants to proceed with hysterectomy for definitive treatment. I think this is reasonable given her lifelong history of difficulties with her periods. I think a vaginal hysterectomy would be possible with LAVH back up. She understands that at any time during the procedure I may convert to a total abdominal hysterectomy with a larger incision and larger recovery period. The ovarian conservation issue was reviewed and the options of keeping both ovaries or removing them at the time  of surgery was discussed. The issue again of being perimenopausal and her age  and issues of keeping her ovaries for hormone production and cardiovascular and bone support as well as decreased overall mortality but the risk of ovarian disease in the future both benign such as pain or cysts and ovarian cancer was reviewed and she wants to keep both ovaries accepting the risk of ovarian cancer in the future.  She does give me permission to remove one or both ovaries at the time of surgery if significant disease is encountered or it is my best recommendation at that time to do so. The expected intraoperative/postoperative courses as well as the recovery period was reviewed.   The risks of infection requiring prolonged antibiotics as well as hematoma/abscess formation requiring reoperation and drainage was reviewed with her.  The risks of incisional complications leading to draining, closure by secondary intention, long-term issues of keloid/cosmetics and hernia formation was reviewed.  Hemorrhage necessitating transfusion and the risks of transfusion discussed to include transfusion reaction, hepatitis, HIV, mad cow disease and other unknown entities.  Inadvertent injury to internal organs either immediately recognized or delay recognized including bowel, bladder, ureters, vessels and nerves necessitating major exploratory reparative surgeries and future reparative surgeries including ostomy formation, bowel resection, bladder repair, ureteral damage repair was discussed with her.  The issues of absolute irreversible sterility and sexuality following hysterectomy to include persistent orgasmic dysfunction and persistent dyspareunia was all discussed understood and accepted. Patient's questions were answered to her satisfaction she is ready to proceed with surgery.  The patient has received a preoperative clearance by her primary physician.   Dara Lords MD, 12:03 PM 04/07/2012

## 2012-04-07 NOTE — Patient Instructions (Addendum)
   Your procedure is scheduled on: Tuesday June 18th  Enter through the [employer] [facility] at: [employer] up the phone at the desk and dial [phone] and inform us of your arrival.  Please call this number if you have any problems the morning of surgery: [phone]  Remember: Do not eat food after midnight: Monday Do not drink clear liquids after: midnight Monday Take these medicines the morning of surgery with a SIP OF WATER: hctz, zoloft, and other morning meds if needed  Do not wear jewelry, make-up, or FINGER nail polish Do not wear lotions, powders, perfumes or deodorant. Do not shave 48 hours prior to surgery. Do not bring valuables to the hospital. Contacts, dentures or bridgework may not be worn into surgery.  Leave suitcase in the car. After Surgery it may be brought to your room. For patients being admitted to the hospital, checkout time is 11:00am the day of discharge.     Remember to use your hibiclens as instructed.Please shower with 1/2 bottle the evening before your surgery and the other 1/2 bottle the morning of surgery. Neck down avoiding private area.

## 2012-04-08 ENCOUNTER — Encounter (HOSPITAL_COMMUNITY): Payer: Self-pay | Admitting: Anesthesiology

## 2012-04-08 ENCOUNTER — Ambulatory Visit (HOSPITAL_COMMUNITY): Payer: 59 | Admitting: Anesthesiology

## 2012-04-08 ENCOUNTER — Encounter (HOSPITAL_COMMUNITY): Payer: Self-pay | Admitting: *Deleted

## 2012-04-08 ENCOUNTER — Ambulatory Visit (HOSPITAL_COMMUNITY)
Admission: RE | Admit: 2012-04-08 | Discharge: 2012-04-09 | Disposition: A | Discharge status: Home / Self Care | Payer: 59 | Source: Ambulatory Visit | Attending: Gynecology | Admitting: Gynecology

## 2012-04-08 ENCOUNTER — Encounter (HOSPITAL_COMMUNITY)
Admission: RE | Disposition: A | Discharge status: Home / Self Care | Payer: Self-pay | Source: Ambulatory Visit | Attending: Gynecology

## 2012-04-08 DIAGNOSIS — Z01812 Encounter for preprocedural laboratory examination: Secondary | ICD-10-CM | POA: Insufficient documentation

## 2012-04-08 DIAGNOSIS — N920 Excessive and frequent menstruation with regular cycle: Secondary | ICD-10-CM

## 2012-04-08 DIAGNOSIS — N8000 Endometriosis of the uterus, unspecified: Secondary | ICD-10-CM | POA: Insufficient documentation

## 2012-04-08 DIAGNOSIS — D251 Intramural leiomyoma of uterus: Secondary | ICD-10-CM | POA: Insufficient documentation

## 2012-04-08 DIAGNOSIS — N926 Irregular menstruation, unspecified: Secondary | ICD-10-CM

## 2012-04-08 DIAGNOSIS — N840 Polyp of corpus uteri: Secondary | ICD-10-CM | POA: Insufficient documentation

## 2012-04-08 DIAGNOSIS — Z01818 Encounter for other preprocedural examination: Secondary | ICD-10-CM | POA: Insufficient documentation

## 2012-04-08 DIAGNOSIS — Z9889 Other specified postprocedural states: Secondary | ICD-10-CM

## 2012-04-08 HISTORY — DX: Anemia, unspecified: D64.9

## 2012-04-08 HISTORY — PX: VAGINAL HYSTERECTOMY: SUR661

## 2012-04-08 SURGERY — HYSTERECTOMY, VAGINAL
Anesthesia: General

## 2012-04-08 MED ORDER — DEXTROSE IN LACTATED RINGERS 5 % IV SOLN: INTRAVENOUS | Status: DC

## 2012-04-08 MED ORDER — DEXAMETHASONE SODIUM PHOSPHATE 10 MG/ML IJ SOLN
INTRAMUSCULAR | Status: DC | PRN
  Administered 2012-04-08: 10 mg via INTRAVENOUS

## 2012-04-08 MED ORDER — LACTATED RINGERS IV SOLN
INTRAVENOUS | Status: DC
Start: 2012-04-08 — End: 2012-04-08
  Administered 2012-04-08 (×4): via INTRAVENOUS

## 2012-04-08 MED ORDER — ONDANSETRON HCL 4 MG/2ML IJ SOLN
INTRAMUSCULAR | Status: AC
  Filled 2012-04-08: qty 2

## 2012-04-08 MED ORDER — PROPOFOL 10 MG/ML IV EMUL
INTRAVENOUS | Status: AC
  Filled 2012-04-08: qty 20

## 2012-04-08 MED ORDER — METOCLOPRAMIDE HCL 5 MG/ML IJ SOLN: 10.0000 mg | Freq: Once | INTRAMUSCULAR | Status: DC | PRN

## 2012-04-08 MED ORDER — KETOROLAC TROMETHAMINE 30 MG/ML IJ SOLN
INTRAMUSCULAR | Status: AC
  Filled 2012-04-08: qty 1

## 2012-04-08 MED ORDER — NEOSTIGMINE METHYLSULFATE 1 MG/ML IJ SOLN
INTRAMUSCULAR | Status: AC
  Filled 2012-04-08: qty 10

## 2012-04-08 MED ORDER — ONDANSETRON HCL 4 MG/2ML IJ SOLN
INTRAMUSCULAR | Status: DC | PRN
  Administered 2012-04-08: 4 mg via INTRAVENOUS

## 2012-04-08 MED ORDER — 0.9 % SODIUM CHLORIDE (POUR BTL) OPTIME
TOPICAL | Status: DC | PRN
  Administered 2012-04-08: 1000 mL

## 2012-04-08 MED ORDER — HYDROMORPHONE HCL PF 1 MG/ML IJ SOLN
INTRAMUSCULAR | Status: AC
  Administered 2012-04-08: 0.5 mg via INTRAVENOUS
  Filled 2012-04-08: qty 1

## 2012-04-08 MED ORDER — LIDOCAINE HCL (CARDIAC) 20 MG/ML IV SOLN
INTRAVENOUS | Status: DC | PRN
  Administered 2012-04-08: 50 mg via INTRAVENOUS

## 2012-04-08 MED ORDER — NEOSTIGMINE METHYLSULFATE 1 MG/ML IJ SOLN
INTRAMUSCULAR | Status: DC | PRN
  Administered 2012-04-08: 3 mg via INTRAVENOUS

## 2012-04-08 MED ORDER — MIDAZOLAM HCL 5 MG/5ML IJ SOLN
INTRAMUSCULAR | Status: DC | PRN
  Administered 2012-04-08: 2 mg via INTRAVENOUS

## 2012-04-08 MED ORDER — MEPERIDINE HCL 25 MG/ML IJ SOLN: 6.2500 mg | INTRAMUSCULAR | Status: DC | PRN

## 2012-04-08 MED ORDER — ONDANSETRON HCL 4 MG/2ML IJ SOLN
4.0000 mg | Freq: Four times a day (QID) | INTRAMUSCULAR | Status: DC | PRN
  Administered 2012-04-08 – 2012-04-09 (×3): 4 mg via INTRAVENOUS
  Filled 2012-04-08 (×3): qty 2

## 2012-04-08 MED ORDER — FENTANYL CITRATE 0.05 MG/ML IJ SOLN
INTRAMUSCULAR | Status: AC
  Filled 2012-04-08: qty 5

## 2012-04-08 MED ORDER — MIDAZOLAM HCL 2 MG/2ML IJ SOLN
INTRAMUSCULAR | Status: AC
  Filled 2012-04-08: qty 2

## 2012-04-08 MED ORDER — GLYCOPYRROLATE 0.2 MG/ML IJ SOLN
INTRAMUSCULAR | Status: DC | PRN
  Administered 2012-04-08: .6 mg via INTRAVENOUS

## 2012-04-08 MED ORDER — PROPOFOL 10 MG/ML IV EMUL
INTRAVENOUS | Status: DC | PRN
  Administered 2012-04-08: 180 mg via INTRAVENOUS

## 2012-04-08 MED ORDER — DIPHENHYDRAMINE HCL 25 MG PO CAPS: 50.0000 mg | ORAL_CAPSULE | Freq: Four times a day (QID) | ORAL | Status: DC | PRN

## 2012-04-08 MED ORDER — GLYCOPYRROLATE 0.2 MG/ML IJ SOLN
INTRAMUSCULAR | Status: AC
  Filled 2012-04-08: qty 2

## 2012-04-08 MED ORDER — ROCURONIUM BROMIDE 100 MG/10ML IV SOLN
INTRAVENOUS | Status: DC | PRN
  Administered 2012-04-08: 10 mg via INTRAVENOUS
  Administered 2012-04-08: 40 mg via INTRAVENOUS

## 2012-04-08 MED ORDER — LIDOCAINE-EPINEPHRINE 1 %-1:100000 IJ SOLN
INTRAMUSCULAR | Status: DC | PRN
  Administered 2012-04-08: 30 mL

## 2012-04-08 MED ORDER — KETOROLAC TROMETHAMINE 30 MG/ML IJ SOLN: 30.0000 mg | Freq: Four times a day (QID) | INTRAMUSCULAR | Status: DC

## 2012-04-08 MED ORDER — HYDROMORPHONE HCL PF 1 MG/ML IJ SOLN
0.2500 mg | INTRAMUSCULAR | Status: DC | PRN
  Administered 2012-04-08 (×4): 0.5 mg via INTRAVENOUS

## 2012-04-08 MED ORDER — FENTANYL CITRATE 0.05 MG/ML IJ SOLN
INTRAMUSCULAR | Status: DC | PRN
  Administered 2012-04-08: 100 ug via INTRAVENOUS
  Administered 2012-04-08: 150 ug via INTRAVENOUS
  Administered 2012-04-08: 100 ug via INTRAVENOUS

## 2012-04-08 MED ORDER — DEXTROSE-NACL 5-0.9 % IV SOLN
INTRAVENOUS | Status: DC
  Administered 2012-04-08 – 2012-04-09 (×3): via INTRAVENOUS

## 2012-04-08 MED ORDER — HYDROCODONE-ACETAMINOPHEN 5-325 MG PO TABS
1.0000 | ORAL_TABLET | ORAL | Status: DC | PRN
  Administered 2012-04-08 – 2012-04-09 (×3): 2 via ORAL
  Filled 2012-04-08 (×3): qty 2

## 2012-04-08 MED ORDER — KETOROLAC TROMETHAMINE 30 MG/ML IJ SOLN
30.0000 mg | Freq: Four times a day (QID) | INTRAMUSCULAR | Status: DC
  Administered 2012-04-08 – 2012-04-09 (×3): 30 mg via INTRAVENOUS
  Filled 2012-04-08 (×3): qty 1

## 2012-04-08 MED ORDER — MORPHINE SULFATE 4 MG/ML IJ SOLN
2.0000 mg | INTRAMUSCULAR | Status: DC | PRN
  Administered 2012-04-08 – 2012-04-09 (×5): 2 mg via INTRAVENOUS
  Filled 2012-04-08 (×3): qty 1

## 2012-04-08 MED ORDER — ONDANSETRON HCL 4 MG PO TABS: 4.0000 mg | ORAL_TABLET | Freq: Four times a day (QID) | ORAL | Status: DC | PRN

## 2012-04-08 MED ORDER — LIDOCAINE HCL (CARDIAC) 20 MG/ML IV SOLN
INTRAVENOUS | Status: AC
  Filled 2012-04-08: qty 5

## 2012-04-08 MED ORDER — KETOROLAC TROMETHAMINE 30 MG/ML IJ SOLN
INTRAMUSCULAR | Status: DC | PRN
  Administered 2012-04-08: 30 mg via INTRAVENOUS

## 2012-04-08 SURGICAL SUPPLY — 28 items
CANISTER SUCTION 2500CC (MISCELLANEOUS) ×2 IMPLANT
CLOTH BEACON ORANGE TIMEOUT ST (SAFETY) ×2 IMPLANT
CONT PATH 16OZ SNAP LID 3702 (MISCELLANEOUS) IMPLANT
CONTAINER PREFILL 10% NBF 60ML (FORM) IMPLANT
DECANTER SPIKE VIAL GLASS SM (MISCELLANEOUS) IMPLANT
GLOVE BIO SURGEON STRL SZ7.5 (GLOVE) ×4 IMPLANT
GLOVE BIOGEL PI IND STRL 6.5 (GLOVE) ×1 IMPLANT
GLOVE BIOGEL PI INDICATOR 6.5 (GLOVE) ×1
GOWN PREVENTION PLUS LG XLONG (DISPOSABLE) ×8 IMPLANT
GOWN STRL REIN XL XLG (GOWN DISPOSABLE) ×2 IMPLANT
NDL SPNL 18GX3.5 QUINCKE PK (NEEDLE) ×1 IMPLANT
NDL SPNL 22GX3.5 QUINCKE BK (NEEDLE) IMPLANT
NEEDLE HYPO 22GX1.5 SAFETY (NEEDLE) IMPLANT
NEEDLE MAYO .5 CIRCLE (NEEDLE) IMPLANT
NEEDLE SPNL 18GX3.5 QUINCKE PK (NEEDLE) ×2 IMPLANT
NEEDLE SPNL 22GX3.5 QUINCKE BK (NEEDLE) IMPLANT
NS IRRIG 1000ML POUR BTL (IV SOLUTION) ×2 IMPLANT
PACK VAGINAL WOMENS (CUSTOM PROCEDURE TRAY) ×2 IMPLANT
SUT VIC AB 0 CT1 18XCR BRD8 (SUTURE) ×3 IMPLANT
SUT VIC AB 0 CT1 36 (SUTURE) ×2 IMPLANT
SUT VIC AB 0 CT1 8-18 (SUTURE) ×6
SUT VIC AB 2-0 SH 27 (SUTURE)
SUT VIC AB 2-0 SH 27XBRD (SUTURE) IMPLANT
SUT VICRYL 0 TIES 12 18 (SUTURE) ×2 IMPLANT
SYR BULB IRRIGATION 50ML (SYRINGE) ×2 IMPLANT
TOWEL OR 17X24 6PK STRL BLUE (TOWEL DISPOSABLE) ×4 IMPLANT
TRAY FOLEY CATH 14FR (SET/KITS/TRAYS/PACK) ×2 IMPLANT
WATER STERILE IRR 1000ML POUR (IV SOLUTION) ×2 IMPLANT

## 2012-04-08 NOTE — Anesthesia Preprocedure Evaluation (Signed)
Anesthesia Evaluation  Patient identified by MRN, date of birth, ID band Patient awake    Reviewed: Allergy & Precautions, H&P , NPO status , Patient's Chart, lab work & pertinent test results  Airway Mallampati: II TM Distance: >3 FB Neck ROM: Full    Dental No notable dental hx. (+) Teeth Intact   Pulmonary neg pulmonary ROS,  breath sounds clear to auscultation  Pulmonary exam normal       Cardiovascular hypertension, Pt. on medications Rhythm:Regular Rate:Normal     Neuro/Psych  Headaches, Depression    GI/Hepatic negative GI ROS, Neg liver ROS,   Endo/Other  Hypothyroidism   Renal/GU negative Renal ROS  negative genitourinary   Musculoskeletal negative musculoskeletal ROS (+)   Abdominal   Peds  Hematology negative hematology ROS (+)   Anesthesia Other Findings   Reproductive/Obstetrics negative OB ROS                           Anesthesia Physical Anesthesia Plan  ASA: II  Anesthesia Plan: General   Post-op Pain Management:    Induction: Intravenous  Airway Management Planned: Oral ETT  Additional Equipment:   Intra-op Plan:   Post-operative Plan: Extubation in OR  Informed Consent: I have reviewed the patients History and Physical, chart, labs and discussed the procedure including the risks, benefits and alternatives for the proposed anesthesia with the patient or authorized representative who has indicated his/her understanding and acceptance.   Dental advisory given  Plan Discussed with: CRNA, Anesthesiologist and Surgeon  Anesthesia Plan Comments:         Anesthesia Quick Evaluation

## 2012-04-08 NOTE — Anesthesia Postprocedure Evaluation (Signed)
Anesthesia Post Note  Patient: Gina Wise  Procedure(s) Performed: Procedure(s) (LRB): HYSTERECTOMY VAGINAL (N/A)  Anesthesia type: General  Patient location: PACU  Post pain: Pain level controlled  Post assessment: Post-op Vital signs reviewed  Last Vitals:  Filed Vitals:   04/08/12 0930  BP:   Pulse: 74  Temp:   Resp: 13    Post vital signs: Reviewed  Level of consciousness: sedated  Complications: No apparent anesthesia complications

## 2012-04-08 NOTE — Progress Notes (Signed)
Patient ID: Gina Wise, female   DOB: [birth year], 51 y.o.   MRN: [id number] DOS TVH Reports pain 4/10  Just received percocet X 2 Abdomen soft Foley with clear yellow urine Results of surgery reviewed Routine PO care, tentative discharge in am.

## 2012-04-08 NOTE — Transfer of Care (Signed)
Immediate Anesthesia Transfer of Care Note  Patient: Gina Wise  Procedure(s) Performed: Procedure(s) (LRB): HYSTERECTOMY VAGINAL (N/A)  Patient Location: PACU  Anesthesia Type: General  Level of Consciousness: sedated  Airway & Oxygen Therapy: Patient Spontanous Breathing and Patient connected to nasal cannula oxygen  Post-op Assessment: Report given to PACU RN  Post vital signs: Reviewed and stable  Complications: No apparent anesthesia complications

## 2012-04-08 NOTE — H&P (Signed)
  The patient was examined.  I reviewed the proposed surgery and consent form with the patient.  The dictated history and physical is current and accurate and all questions were answered. The patient is ready to proceed with surgery and has a realistic understanding and expectation for the outcome.   Dara Lords MD, 7:10 AM 04/08/2012

## 2012-04-08 NOTE — Anesthesia Postprocedure Evaluation (Signed)
  Anesthesia Post-op Note  Patient: Gina Wise  Procedure(s) Performed: Procedure(s) (LRB): HYSTERECTOMY VAGINAL (N/A)  Patient Location: Women[facility] Unit  Anesthesia Type: General  Level of Consciousness: sedated  Airway and Oxygen Therapy: Patient Spontanous Breathing  Post-op Pain: mild  Post-op Assessment: Post-op Vital signs reviewed  Post-op Vital Signs: Reviewed and stable  Complications: No apparent anesthesia complications

## 2012-04-08 NOTE — Addendum Note (Signed)
Addendum  created 04/08/12 1637 by Algis Greenhouse, CRNA   Modules edited:Notes Section

## 2012-04-08 NOTE — Op Note (Signed)
Gina Wise 08-12-1961 [id number]   Post Operative Note   Date of surgery:  04/08/2012  Pre Op Dx:  Irregular menses, menorrhagia  Post Op Dx:  Same  Procedure:  Total vaginal hysterectomy  Surgeon:  Dara Lords  Assistant:  Edyth Gunnels  Anesthesia:  General  EBL:  200 cc  Complications:  None  Specimen:  uterus to pathology  Findings: EUA:  External BUS vagina normal. Cervix normal. Uterus normal size midline mobile. Adnexa without masses.   Operative:  Uterus soft globoid consistent with adenomyosis. Otherwise overall normal. Right and left fallopian tube segments normal. Bilateral Hulka clips noted and removed.  Right left ovaries grossly normal. Cul-de-sac normal without evidence of endometriosis or adhesive disease.  Procedure:  Patient was taken to the operating room, underwent general anesthesia, placed in the low dorsal lithotomy position, received a perineal/vaginal preparation with Betadine solution, an EUA was performed and a time out was performed by the surgical team. A Foley catheter was then placed and the patient was draped in usual fashion. The patient was placed in the high dorsal lithotomy position, the cervix visualized with a weighted speculum, grasped with a single-tooth tenaculum and the cervical mucosa was circumferentially injected using 1% lidocaine with epinephrine mixture a total of 10 cc. The cervical mucosa was then circumferentially incised and the paracervical planes were sharply developed without difficulty. The posterior cul-de-sac was then sharply entered without difficulty and a long weighted speculum was placed. The right and left uterosacral ligaments were identified, clamped cut and ligated using 0 Vicryl suture and tagged for future reference. The anterior cul-de-sac was sharply and bluntly developed and ultimately entered without difficulty and the uterus was then progressively freed from its attachments to clamping, cutting and ligating  of the cardinal ligaments and parametrial tissues using 0 Vicryl suture. The uterus was then delivered through the vagina and the remaining uterine/ovarian pedicles were doubly clamped bilaterally and the uterus was excised and sent to pathology. A Hulka clip was attached to the right proximal fallopian tube segment. A separate Hulka clip was noted on the left remaining pedicle and this was removed and sent to pathology for gross only. Both pedicles were then doubly ligated using 0 Vicryl suture and cut free after assuring adequate hemostasis. The bowel was packed using a tagged tail sponge and the long weighted speculum was replaced with a shorter weighted speculum, the vaginal cuff grasped with an Allis clamp and the posterior vaginal cuff was run from uterosacral ligament to uterosacral ligament using 0 Vicryl suture in a running interlocking stitch. The tail sponge was removed and the pelvis was copiously irrigated showing adequate hemostasis and the vagina was closed anterior to posterior using 0 Vicryl suture in interrupted figure-of-eight stitch. The vagina was irrigated, hemostasis visualized, the patient placed in the supine position, received intraoperative Toradol, was awakened without difficulty and was taken to the recovery room in good condition having tolerated the procedure well with 200 cc of clear yellow urine in the Foley catheter.    Dara Lords MD, 8:59 AM 04/08/2012

## 2012-04-09 ENCOUNTER — Encounter (HOSPITAL_COMMUNITY): Payer: Self-pay | Admitting: Gynecology

## 2012-04-09 LAB — CBC
Platelets: 271 10*3/uL (ref 150–400)
RDW: 15.6 % — ABNORMAL HIGH (ref 11.5–15.5)
WBC: 15.1 10*3/uL — ABNORMAL HIGH (ref 4.0–10.5)

## 2012-04-09 MED ORDER — HYDROCODONE-ACETAMINOPHEN 5-325 MG PO TABS: 1.0000 | ORAL_TABLET | ORAL | Status: AC | PRN

## 2012-04-09 NOTE — Discharge Instructions (Signed)
°  Postoperative Instructions Hysterectomy ° °Dr. Tomesha Sargent and the nursing staff have discussed postoperative instructions with you.  If you have any questions please ask them before you leave the hospital, or call Dr Day Deery’s office at [phone].   ° °We would like to emphasize the following instructions: ° ° °  Call the office to make your follow-up appointment as recommended by Dr Cayce Quezada (usually 2 weeks). ° °  You were given a prescription, or one was ordered for you at the pharmacy you designated.  Get that prescription filled and take the medication according to instructions. ° °  You may eat a regular diet, but slowly until you start having bowel movements. ° °  Drink plenty of water daily. ° °  Nothing in the vagina (intercourse, douching, objects of any kind) until released by Dr Dyanara Cozza. ° °  No driving for two weeks.  Wait to be cleared by Dr Jashaun Penrose at your first post op check.  Car rides (short) are ok after several days at home, as long as you are not having significant pain, but no traveling out of town. ° °  You may shower, but no baths.  Walking up and down stairs is ok.  No heavy lifting, prolonged standing, repeated bending or any “working out” until your first post op check. ° °  Rest frequently, listen to your body and do not push yourself and overdo it. ° °  Call if: ° °o Your pain medication does not seem strong enough. °o Worsening pain or abdominal bloating °o Persistent nausea or vomiting °o Difficulty with urination or bowel movements. °o Temperature of 101 degrees or higher. °o Bleeding heavier then staining (clots or period type flow). °o Incisions become red, tender or begin to drain. °o You have any questions or concerns. °

## 2012-04-09 NOTE — Progress Notes (Signed)
Gina Wise [birth year] [id number]   1 Day Post-Op Procedure(s) (LRB): HYSTERECTOMY VAGINAL (N/A)  Subjective: Patient reports no acute distress, pain severity reported mild, yes taking PO, foley catheter out, yes voiiding, yes ambulating, yes passing flatus  Objective: Afeb, VSS   EXAM General: awake, no distress Resp: rhonchi clear to auscultation bilaterally Cardio: regular rate and rhythm, S1, S2 normal, no murmur, click, rub or gallop GI: normal findings:soft, non-tender; bowel sounds normal; no masses,  no organomegaly Lower Extremities: Without swelling or tenderness Vaginal Bleeding: Reported scant  Assessment: s/p Procedure(s): HYSTERECTOMY VAGINAL: progressing well, drinking, voiding, ambulating with PO Hb 11,  ready for discharge.  Plan: Discharge home today.  Precautions, instructions and follow up were discussed with the patient.  Prescriptions provided  Per AVS.  Patient to call the office to arrange a post-operative appointmant in 2 weeks.  Dara Lords, MD 04/09/2012 8:47 AM

## 2012-04-09 NOTE — Discharge Summary (Signed)
Gina Wise 05/07/1961 [id number]   Discharge Summary  Date of Admission:  04/08/2012  Date of Discharge:  04/09/2012  Discharge Diagnosis:  Menorrhagia, irregular menses, endometrial polyp, leiomyoma, adenomyosis  Procedure:  Procedure(s): HYSTERECTOMY VAGINAL  Pathology:  Diagnosis Uterus and cervix - CERVIX: SQUAMOUS METAPLASIA AND NABOTHIAN CYST. - ENDOMETRIUM: PROLIFERATIVE WITH BENIGN ENDOMETRIAL POLYP. NO HYPERPLASIA OR CARCINOMA. - MYOMETRIUM: ADENOMYOSIS. LEIOMYOMATA. - UTERINE SEROSA: UNREMARKABLE. NO ENDOMETRIOSIS OR MALIGNANCY.  Hospital Course:  Patient underwent an uncomplicated [facility] 04/08/2012. The patient's postoperative course was uncomplicated and she was discharged on postoperative day #1 ambulating well, taking an oral diet, voiding without difficulty, adequate pain relief with a postoperative hemoglobin of 11.0. The patient received precautions, instructions and follow up and will be seen in the office in 2 weeks following discharge with discharge prescriptions per AVS.    Dara Lords MD, 4:47 PM 04/09/2012

## 2012-04-09 NOTE — Progress Notes (Signed)
Discharge instructions reviewed with patient.  Patient states understanding of home care and signs/symptoms to report to MD.  No home equipment needed.  Wheelchair to car with staff without incident.  Discharged home with friend.

## 2012-04-11 ENCOUNTER — Telehealth: Payer: Self-pay | Admitting: Family Medicine

## 2012-04-11 ENCOUNTER — Telehealth: Payer: Self-pay | Admitting: *Deleted

## 2012-04-11 MED ORDER — POLYETHYLENE GLYCOL 3350 17 GM/SCOOP PO POWD: 17.0000 g | Freq: Two times a day (BID) | ORAL | Status: AC | PRN

## 2012-04-11 NOTE — Telephone Encounter (Signed)
Pt had hysterectomy on 04/08/12 and is c/o no bowl movement yet. She is taking the stool softeners as directed, pt was told not to take po medication yet. No other complaints, pt would like to know what else she can take? Please advise

## 2012-04-11 NOTE — Telephone Encounter (Signed)
OTC Dulcolax suppository

## 2012-04-11 NOTE — Telephone Encounter (Signed)
Called back to check in about hysterectomy.   Doing ok but notes constipation.  Called in Miralax.  Will f/u PRN

## 2012-04-11 NOTE — Telephone Encounter (Signed)
Pt informed with the below note. 

## 2012-04-14 ENCOUNTER — Telehealth: Payer: Self-pay | Admitting: *Deleted

## 2012-04-14 NOTE — Telephone Encounter (Signed)
Pt informed with the below note. 

## 2012-04-14 NOTE — Telephone Encounter (Signed)
(  Follow up from last telephone encounter) pt calling c/o rectal pressure continues, c/o painful stool, little balls of stool, after bowl movements she has vaginal spotting, then it stops. Pt hasn't taken any of pain medication nor her iron supplements. Pt would like you advice, what to do next. Please advise

## 2012-04-14 NOTE — Telephone Encounter (Signed)
Recommend FiberCon or Metamucil daily. Plenty of fluids by mouth. She can use glycerin suppositories or fleets enema as needed to help with bowel movement

## 2012-04-17 ENCOUNTER — Telehealth: Payer: Self-pay | Admitting: Gynecology

## 2012-04-17 NOTE — Telephone Encounter (Signed)
[employer] Called to confirm surgery was done and to get estimated return to work date. I called patient. She has a sedentary job and hoped to return at 3-4 weeks p.op. However, she is having some pressure and pain and not sure how her recovery is going. Uncomfortable to sit.  I rescheduled her p.op visit from 7/2 to tomorrow morning at 10am so she can have this assessed by Dr. Velvet Bathe.  Meantime, I gave [employer] Estimated return to work date of May 07, 2012.

## 2012-04-18 ENCOUNTER — Ambulatory Visit (INDEPENDENT_AMBULATORY_CARE_PROVIDER_SITE_OTHER): Payer: 59 | Admitting: Gynecology

## 2012-04-18 ENCOUNTER — Encounter: Payer: Self-pay | Admitting: Gynecology

## 2012-04-18 ENCOUNTER — Ambulatory Visit: Payer: 59 | Admitting: Gynecology

## 2012-04-18 DIAGNOSIS — Z9889 Other specified postprocedural states: Secondary | ICD-10-CM

## 2012-04-18 DIAGNOSIS — N390 Urinary tract infection, site not specified: Secondary | ICD-10-CM

## 2012-04-18 LAB — URINALYSIS W MICROSCOPIC + REFLEX CULTURE
Crystals: NONE SEEN
Nitrite: NEGATIVE
Specific Gravity, Urine: 1.02 (ref 1.005–1.030)
Urobilinogen, UA: 0.2 mg/dL (ref 0.0–1.0)

## 2012-04-18 MED ORDER — CIPROFLOXACIN HCL 250 MG PO TABS: 250.0000 mg | ORAL_TABLET | Freq: Two times a day (BID) | ORAL | Status: AC

## 2012-04-18 NOTE — Progress Notes (Signed)
Disability form completed and faxed to [employer]  Copy will be in Careers information officer.

## 2012-04-18 NOTE — Progress Notes (Signed)
Patient presents 10 days postoperative status post TVH. Notes some pelvic pressure with sitting and discomfort with bowel movements. Eating/drinking/voiding without difficulty. No fever chills and overall is feeling well with the exception of this discomfort.  Exam was [name] assistant Abdomen soft nontender without masses guarding rebound organomegaly. Pelvic external BUS vagina with cuff intact scant drainage. Bimanual with fullness at the cuff the approximately 7 - 8 cm consistent with cuff hematoma. Minimal tenderness. Rectovaginal exam confirms.   Assessment and plan: Urinalysis suspicious for low-grade UTI. Will cover with ciprofloxacin 250 mg twice a day x7 days. Exam and symptoms consistent with cuff hematoma. Do not feel ultrasound or other studies needed at this time. Patient will monitor. Reviewed possibilities for spontaneous resorption versus possible spontaneous cuff drainage. I did review possible long-term issues if persists to include surgical drainage or possible CT-guided drainage. Hopefully she will resolve this on her own. Patient will follow up in 2 weeks assuming she continues well otherwise. This ASAP sooner return precautions given.

## 2012-04-18 NOTE — Patient Instructions (Signed)
Slowly resume normal activities with the exception of continued pelvic rest. Take antibiotics twice daily for one week. Follow pelvic pressure symptoms. If symptoms worsen, fever develops or heavy vaginal bleeding call as soon as possible. Follow up in 2 weeks for your next postoperative visit.

## 2012-04-20 LAB — URINE CULTURE

## 2012-04-22 ENCOUNTER — Ambulatory Visit: Payer: 59 | Admitting: Gynecology

## 2012-04-28 ENCOUNTER — Ambulatory Visit (INDEPENDENT_AMBULATORY_CARE_PROVIDER_SITE_OTHER): Payer: 59 | Admitting: Gynecology

## 2012-04-28 ENCOUNTER — Encounter: Payer: Self-pay | Admitting: Gynecology

## 2012-04-28 DIAGNOSIS — Z9889 Other specified postprocedural states: Secondary | ICD-10-CM

## 2012-04-28 DIAGNOSIS — R109 Unspecified abdominal pain: Secondary | ICD-10-CM

## 2012-04-28 NOTE — Progress Notes (Signed)
Patient presents postop status post [facility] 04/08/2012.  Patient evaluated 10 days postop with increasing pelvic pressure and was found to have a probable cuff hematoma palpated at 7-8 cm. Patient was treated expectantly and she notes significant decrease in her symptoms with minimal discomfort. She is voiding eating and having bowel movements. No cervical bowel movements are no longer tender. She does have some suprapubic pressure symptoms. She was treated with ciprofloxacin for a presumed UTI at her last visit.  Exam with Sherri assistant Abdomen soft nontender without masses guarding rebound organomegaly. Pelvic external BUS vagina normal. Cuff healing nicely with suture line intact. Bimanual with 4 cm mass at the cuff consistent with her hematoma smaller in size from her prior exam.  Assessment and plan: Postop check status post TVH with probable cuff hematoma resolving. Some suprapubic pressure symptoms. UA appears contaminated. Will follow up and treat according to culture. Otherwise will follow up in 2 weeks for her next postoperative visit. She'll slowly resume all activities with the exception of pelvic rest.

## 2012-04-28 NOTE — Patient Instructions (Signed)
Slowly resume all activities with the exception of continued pelvic rest. Follow up in 2 weeks for postop check. Sooner if any issues.

## 2012-04-29 LAB — URINALYSIS W MICROSCOPIC + REFLEX CULTURE
Crystals: NONE SEEN
Ketones, ur: NEGATIVE mg/dL
Nitrite: NEGATIVE
Protein, ur: NEGATIVE mg/dL
Specific Gravity, Urine: 1.015 (ref 1.005–1.030)
Urobilinogen, UA: 0.2 mg/dL (ref 0.0–1.0)

## 2012-04-30 ENCOUNTER — Ambulatory Visit: Payer: 59 | Admitting: Obstetrics & Gynecology

## 2012-04-30 LAB — URINE CULTURE: Organism ID, Bacteria: NO GROWTH

## 2012-05-05 ENCOUNTER — Encounter: Payer: Self-pay | Admitting: Gynecology

## 2012-05-05 ENCOUNTER — Telehealth: Payer: Self-pay | Admitting: Gynecology

## 2012-05-05 NOTE — Telephone Encounter (Signed)
Patient had talked with Dr. Velvet Bathe at last office visit and said they had decided she would return part-time, four hours daily, on July 17, 18 and 19th.  She will return to the office Monday, July 22 for p.op visit and reassessment regarding restrictions will be made at that time.  I have typed letter and it will be on your desk to sign if okay. Thanks

## 2012-05-12 ENCOUNTER — Encounter: Payer: Self-pay | Admitting: Gynecology

## 2012-05-12 ENCOUNTER — Ambulatory Visit (INDEPENDENT_AMBULATORY_CARE_PROVIDER_SITE_OTHER): Payer: 59 | Admitting: Gynecology

## 2012-05-12 DIAGNOSIS — Z09 Encounter for follow-up examination after completed treatment for conditions other than malignant neoplasm: Secondary | ICD-10-CM

## 2012-05-12 NOTE — Progress Notes (Signed)
Patient is status post [facility] 04/08/2012. Postop course complicated by cuff hematoma. Patient reports feeling better week by week and is back at work half days now. No drainage.  Exam is consistent Abdomen soft nontender without masses guarding rebound organomegaly. Pelvic external BUS vagina with cuff sutures in place. Bimanual with fullness 4 cm at the cuff mildly tender consistent with hematoma. Rectovaginal exam confirms.  Assessment and plan: Postoperative checkup 5 weeks status post TVH with cuff hematoma.  Slowly resolving. Feels better to the patient with minimal discomfort. We'll slowly resume all activity with the exception of pelvic rest and represent in 2 weeks.

## 2012-05-12 NOTE — Patient Instructions (Signed)
Slowly resume all normal activities with the exception of continued pelvic rest. Return in 2 weeks for next postoperative check.

## 2012-05-15 ENCOUNTER — Encounter: Payer: Self-pay | Admitting: Gynecology

## 2012-05-15 ENCOUNTER — Ambulatory Visit (INDEPENDENT_AMBULATORY_CARE_PROVIDER_SITE_OTHER): Payer: 59 | Admitting: Gynecology

## 2012-05-15 DIAGNOSIS — N949 Unspecified condition associated with female genital organs and menstrual cycle: Secondary | ICD-10-CM

## 2012-05-15 DIAGNOSIS — R102 Pelvic and perineal pain: Secondary | ICD-10-CM

## 2012-05-15 LAB — URINALYSIS W MICROSCOPIC + REFLEX CULTURE
Bilirubin Urine: NEGATIVE
Glucose, UA: NEGATIVE mg/dL
Protein, ur: NEGATIVE mg/dL
Urobilinogen, UA: 0.2 mg/dL (ref 0.0–1.0)

## 2012-05-15 MED ORDER — CIPROFLOXACIN HCL 250 MG PO TABS: 250.0000 mg | ORAL_TABLET | Freq: Two times a day (BID) | ORAL | Status: AC

## 2012-05-15 MED ORDER — HYDROCODONE-ACETAMINOPHEN 2.5-500 MG PO TABS: 1.0000 | ORAL_TABLET | Freq: Four times a day (QID) | ORAL | Status: AC | PRN

## 2012-05-15 NOTE — Progress Notes (Signed)
Patient was just seen several days ago in postoperative follow up doing well with cuff hematoma. Notes over the last day or so worsening lower pelvic discomfort with pressure symptoms with urination. She has gone back to work and is doing a lot of sitting.  Having regular bowel movements, eating, drinking without difficulties. No fevers chills or other symptoms.  Exam was Berenice Bouton Spine straight no CVA tenderness. Abdomen soft nontender without masses guarding rebound organomegaly. Pelvic external BUS vagina with cuff intact sutures are in place. No drainage. Bimanual with 4 cm mass mildly tender.  Assessment and plan: Lower pelvic discomfort with some urinary symptoms. UA mildly atypical. Will cover with ciprofloxacin 500 twice a day x7 days. Check baseline ultrasound for cuff hematoma. I think her discomfort may be a combination of my recent exam pushing on this area and starting back to work where she sitting. I again reviewed possibilities and probabilities. I expect that this will slowly resolve over time. Possible need for drainage was again discussed with her, either attempted direct entry versus CT-guided. We'll follow up after ultrasound and at this point plan expected management. I gave her prescription for Lortab 2.5-325 #30 no refill.

## 2012-05-15 NOTE — Patient Instructions (Signed)
Follow up for ultrasound as scheduled. Start antibiotics twice daily for one week.

## 2012-05-15 NOTE — Addendum Note (Signed)
Addended by: Dayna Barker on: 05/15/2012 03:11 PM   Modules accepted: Orders

## 2012-05-18 LAB — URINE CULTURE

## 2012-05-19 ENCOUNTER — Ambulatory Visit (INDEPENDENT_AMBULATORY_CARE_PROVIDER_SITE_OTHER): Payer: 59

## 2012-05-19 ENCOUNTER — Ambulatory Visit (INDEPENDENT_AMBULATORY_CARE_PROVIDER_SITE_OTHER): Payer: 59 | Admitting: Gynecology

## 2012-05-19 ENCOUNTER — Encounter: Payer: Self-pay | Admitting: Gynecology

## 2012-05-19 DIAGNOSIS — N9489 Other specified conditions associated with female genital organs and menstrual cycle: Secondary | ICD-10-CM

## 2012-05-19 DIAGNOSIS — N949 Unspecified condition associated with female genital organs and menstrual cycle: Secondary | ICD-10-CM

## 2012-05-19 DIAGNOSIS — R102 Pelvic and perineal pain: Secondary | ICD-10-CM

## 2012-05-19 MED ORDER — NITROFURANTOIN MONOHYD MACRO 100 MG PO CAPS: 100.0000 mg | ORAL_CAPSULE | Freq: Two times a day (BID) | ORAL | Status: AC

## 2012-05-19 NOTE — Patient Instructions (Signed)
Take Macrobid antibiotics twice daily for one week. Follow up in 2 weeks for postoperative checkup

## 2012-05-19 NOTE — Progress Notes (Signed)
Patient follows up for ultrasound. Start on antibiotics for UTI although it grew out a low level Staphylococcus that was resistant to the ciprofloxacin that was prescribed.  Ultrasound shows a complex avascular mass 22 x 19 x 34 mm. Ovaries bilateral normal, no free fluid in the cul-de-sac.  Assessment and plan: Cuff hematoma, resolving. Ultrasound has a classic appearance for cuff hematoma. I suspect that this will spontaneously resolve over the next several weeks. Patient is back to work and otherwise doing well with the exception of pressure when she sits. We'll treat with Macrobid 100 mg twice a day for the UTI which the Staphylococcus is sensitive to. She'll follow up in 2 weeks.

## 2012-05-26 ENCOUNTER — Ambulatory Visit: Payer: 59 | Admitting: Gynecology

## 2012-06-02 ENCOUNTER — Ambulatory Visit: Payer: 59 | Admitting: Gynecology

## 2012-06-04 ENCOUNTER — Ambulatory Visit (INDEPENDENT_AMBULATORY_CARE_PROVIDER_SITE_OTHER): Payer: 59 | Admitting: Gynecology

## 2012-06-04 ENCOUNTER — Encounter: Payer: Self-pay | Admitting: Gynecology

## 2012-06-04 DIAGNOSIS — R300 Dysuria: Secondary | ICD-10-CM

## 2012-06-04 DIAGNOSIS — Z9889 Other specified postprocedural states: Secondary | ICD-10-CM

## 2012-06-04 LAB — URINALYSIS W MICROSCOPIC + REFLEX CULTURE
Bilirubin Urine: NEGATIVE
Crystals: NONE SEEN

## 2012-06-04 NOTE — Patient Instructions (Signed)
Office will call you with urine culture results. Follow up in several months for annual exam. Follow up sooner if any issues.

## 2012-06-04 NOTE — Progress Notes (Signed)
Patient presents for postoperative check. Status post TVH complicated by cuff hematoma. Notes prior symptoms of pressure and feeling like she is sitting on something has totally resolved. Still having some issues with intermittent frequency/dysuria. Recently treated with oral antibiotics for a low level staph urinary infection.  Exam was Gina Wise Asst. Abdomen soft nontender without masses guarding rebound organomegaly. Pelvic external BUS vagina with cuff healing nicely. Bimanual without masses or tenderness.  Assessment and plan: 1. Postop visit status post TVH with cuff hematoma now resolved. We'll resume all normal activities. Follow up with me in several months for her annual exam. 2. Urinary symptoms. Urinalysis appears infected.  Will await culture. She did have a negative urinary culture early July and the follow up low-level staphylococcus. We'll await cultures and treat accordingly.

## 2012-06-06 ENCOUNTER — Telehealth: Payer: Self-pay | Admitting: Gynecology

## 2012-06-06 MED ORDER — PHENAZOPYRIDINE HCL 200 MG PO TABS: 200.0000 mg | ORAL_TABLET | Freq: Three times a day (TID) | ORAL | Status: AC | PRN

## 2012-06-06 NOTE — Telephone Encounter (Signed)
Left message for pt to call.

## 2012-06-06 NOTE — Telephone Encounter (Signed)
Tell patient that the urine culture did not grow out any significant bacteria. I want her to push fluids to flush her system. Also I want to give her a short trial of Pyridium which will relax her bladder and I think make her feel better and she can take this over the next 2 weeks until total healing distal hopefully make her bladder feel better.

## 2012-06-06 NOTE — Telephone Encounter (Signed)
Pt informed with the below note. 

## 2012-07-01 ENCOUNTER — Ambulatory Visit (INDEPENDENT_AMBULATORY_CARE_PROVIDER_SITE_OTHER): Payer: 59 | Admitting: Family Medicine

## 2012-07-01 ENCOUNTER — Encounter: Payer: Self-pay | Admitting: Family Medicine

## 2012-07-01 VITALS — BP 135/79 | HR 79 | Ht 66.0 in | Wt 176.0 lb

## 2012-07-01 DIAGNOSIS — D509 Iron deficiency anemia, unspecified: Secondary | ICD-10-CM

## 2012-07-01 DIAGNOSIS — E785 Hyperlipidemia, unspecified: Secondary | ICD-10-CM

## 2012-07-01 DIAGNOSIS — E039 Hypothyroidism, unspecified: Secondary | ICD-10-CM

## 2012-07-01 DIAGNOSIS — E663 Overweight: Secondary | ICD-10-CM

## 2012-07-01 DIAGNOSIS — E611 Iron deficiency: Secondary | ICD-10-CM

## 2012-07-01 LAB — IRON AND TIBC
%SAT: 13 % — ABNORMAL LOW (ref 20–55)
Iron: 55 ug/dL (ref 42–145)
TIBC: 420 ug/dL (ref 250–470)

## 2012-07-01 LAB — BASIC METABOLIC PANEL
Glucose, Bld: 94 mg/dL (ref 70–99)
Potassium: 3.8 mEq/L (ref 3.5–5.3)
Sodium: 137 mEq/L (ref 135–145)

## 2012-07-01 LAB — CBC WITH DIFFERENTIAL/PLATELET
Basophils Absolute: 0 10*3/uL (ref 0.0–0.1)
Basophils Relative: 1 % (ref 0–1)
Hemoglobin: 14.2 g/dL (ref 12.0–15.0)
MCHC: 35.6 g/dL (ref 30.0–36.0)
Monocytes Relative: 8 % (ref 3–12)
Neutro Abs: 4.4 10*3/uL (ref 1.7–7.7)
Neutrophils Relative %: 55 % (ref 43–77)
RBC: 4.9 MIL/uL (ref 3.87–5.11)
WBC: 8 10*3/uL (ref 4.0–10.5)

## 2012-07-01 NOTE — Assessment & Plan Note (Signed)
TSH of 4.890 with a free T4 of 1.08 in April of 2013. Will repeat TSH and free T4 today. Continue Synthroid 100 mcg for now.

## 2012-07-01 NOTE — Patient Instructions (Addendum)
I would like to check a thyroid level.  I recommend that you meet with our Registered Dietitian (Nutritionist), Dr. Vickki Muff for help addressing your dietary and eating habits.  You can schedule an appointment with her by calling her directly at [phone].

## 2012-07-01 NOTE — Assessment & Plan Note (Signed)
Not currently taking iron supplements. Obtaining CBC as well as iron panel to assess baseline since hysterectomy in June 2013.

## 2012-07-01 NOTE — Assessment & Plan Note (Signed)
Patient had lab work done as part of her health maintenance on 05/29/2012: LDL 164, HDL 55, total cholesterol 657, A1c 5.1. LDL is not at goal at this time. Given the fact that patient is actively trying to lose weight, I will defer starting a medication for now and will readdress issue at a following visit.

## 2012-07-01 NOTE — Assessment & Plan Note (Addendum)
Weight gain: Rate 159 pounds in December of 2012. Currently weighs 176 pounds. Patient is concerned about her weight gain. She has not been exercising on a regular basis given her recent surgery. It has just been recently cleared. She was disappointed when I told her that I was not prescribe phentermine, but did agree to follow with my plan. I explained that I wanted her to meet with Dr. Gerilyn Pilgrim to get a better understanding of her new eating patterns and assess whether she actually needs the diet supplement. It appears to me that there is a disconnect between the feeling of being full and the feeling of hunger. Given the brief food recall the patient gives me, it appears that she may not be getting enough food during the day and over eating at night. Gave patient food log to complete before seeing Dr. Gerilyn Pilgrim. Will also obtain TSH in the setting of weight gain.

## 2012-07-01 NOTE — Progress Notes (Signed)
Patient ID: Gina Wise, female   DOB: September 29, 1961, 51 y.o.   MRN: [id number] Patient ID: Gina Wise    DOB: 26-Nov-1960, 51 y.o.   MRN: [id number] --- Subjective:  Gina Wise is a 51 y.o.female with h/o HTN, recent hysterectomy for fibroids, hypothyroid who presents for evaluation of weight gain and feeling that she is always hungry.   She had a hysterectomy in June of 2013 and since then has been "hungry all the time". She states that she is hungry even when she feels full and bloated. She reports that she thinks about food and what she is going to make for her next meal all the time, even when she is feeling full. She states that 18 months ago she had similar problems and was prescribed phentermine for 3 months which had helped with her weight loss.  Today's food recall: For breakfast she had a bolus serial, she did not have a real lunch at noon that it's not on all mentions, grapes, palpable, crackers, 1 cheese stick. She drinks water throughout the day. She states that she feels full now but is feeling hungry and thinking about what she is going to make for dinner. She states that she is very concerned about her weight. She denies emotional eating. As a matter of fact her life is more stable than it has been in the past. Her mother passed away in Oct 20, 2023 and she was her primary caregiver.   ROS: see HPI Past Medical History: reviewed and updated medications and allergies. Social History: Tobacco: Denies  Objective: Filed Vitals:   07/01/12 1433  BP: 135/79  Pulse: 79    Physical Examination:   General appearance - alert, well appearing, and in no distress Neck - supple, no significant adenopathy Chest - clear to auscultation, no wheezes, rales or rhonchi, symmetric air entry Heart - normal rate, regular rhythm, normal S1, S2, no murmurs, rubs, clicks or gallops Abdomen - soft, nontender, nondistended, no masses or organomegaly Extremities - peripheral pulses normal, no pedal edema, no clubbing  or cyanosis

## 2012-07-02 ENCOUNTER — Encounter: Payer: Self-pay | Admitting: Family Medicine

## 2012-07-02 LAB — FERRITIN: Ferritin: 11 ng/mL (ref 10–291)

## 2012-07-02 LAB — TSH: TSH: 3.186 u[IU]/mL (ref 0.350–4.500)

## 2012-07-02 LAB — T4, FREE: Free T4: 1.44 ng/dL (ref 0.80–1.80)

## 2012-07-15 ENCOUNTER — Encounter: Payer: Self-pay | Admitting: Family Medicine

## 2012-07-15 ENCOUNTER — Ambulatory Visit (INDEPENDENT_AMBULATORY_CARE_PROVIDER_SITE_OTHER): Payer: 59 | Admitting: Family Medicine

## 2012-07-15 VITALS — BP 124/81 | HR 71 | Temp 98.0°F | Ht 66.0 in | Wt 176.9 lb

## 2012-07-15 DIAGNOSIS — I10 Essential (primary) hypertension: Secondary | ICD-10-CM

## 2012-07-15 DIAGNOSIS — E039 Hypothyroidism, unspecified: Secondary | ICD-10-CM

## 2012-07-15 DIAGNOSIS — E663 Overweight: Secondary | ICD-10-CM

## 2012-07-15 MED ORDER — PHENTERMINE HCL 15 MG PO CAPS: 15.0000 mg | ORAL_CAPSULE | ORAL | Status: DC

## 2012-07-15 NOTE — Patient Instructions (Addendum)
Come back for a nurse visit once a week to get your blood pressure and weight check.  Continue with the diet and exercise.  If any chest pain, trouble breathing, increase in Blood pressure, stop the medicine and come to the clinic.

## 2012-07-16 NOTE — Progress Notes (Signed)
Patient ID: Gina Wise, female   DOB: 26-Sep-1961, 51 y.o.   MRN: [id number] --- Subjective:  Gina Wise is a 51 y.o.female who presents for follow up on weight management. Patient brought back food journal which consists of fruit (stranwberries and bluberries, apples), vegetables (salad, carrots, mushroom). Carb content in one day includes a small individual bag of popcorn and brown rice. Protein includes baked chicken, string cheese. Drinks include one small coke can in the morning and water for the rest of the day.  Activity includes: walking dog, going up stairs at work (4 flights) and at home (2 flights). Patient reports still having some trouble with abdominal pain when doing more strenuous activity.   ROS: see HPI Past Medical History: reviewed and updated medications and allergies. Social History: Tobacco: denies  Objective: Filed Vitals:   07/15/12 1410  BP: 124/81  Pulse: 71  Temp: 98 F (36.7 C)    Physical Examination:   General appearance - alert, well appearing, and in no distress Neck - supple, no significant adenopathy Chest - clear to auscultation, no wheezes, rales or rhonchi, symmetric air entry Heart - normal rate, regular rhythm, normal S1, S2, no murmurs, rubs, clicks or gallops Abdomen - soft, nontender, nondistended, no masses or organomegaly Extremities - peripheral pulses normal, no pedal edema  TSH: 3.186, T4: 1.44, hemoglobin: 14.2

## 2012-07-16 NOTE — Assessment & Plan Note (Signed)
Controled. Continue current management

## 2012-07-16 NOTE — Assessment & Plan Note (Signed)
TSh and T4 normal. continue levothyroxine 

## 2012-07-16 NOTE — Assessment & Plan Note (Addendum)
BMI of 28.57 with medication controled hypertension. Gained 1 lb since last visit 2 weeks ago. There has been a 12lb weight gain since march 2013. Given patient's food journal, she is eating a balanced and healthy diet, rich in fruits and vegetables and limited in complex carbs and fats. I have recommended that she try cutting out the daily small coke (110calories) she drinks in the morning. She is limited in her physical activity given her recent recovery from hysterectomy. Given that patient is actively watching her diet and eating a balanced diet as well as trying to increase physical activity, I will prescribe a very short course of phentermine per her initial request (6 weeks). Her TSH and T4 were normal as well as her hemoglobin, which previously had been low. I reviewed the side effects with the patient including rare risk of pulmonary hypertension. Patient's blood pressure is controled on hydrochlorothiazide 12.5mg . Patient is to take daily BP at home and to come weekly for a nurse visit to get a BP and weight check. Patient is still to make appointment with Dr. Gerilyn Pilgrim for active nutrition management. Will see patient back in 1 month or sooner. Reviewed with patient what to look for to return (see AVS).

## 2012-07-17 ENCOUNTER — Telehealth: Payer: Self-pay | Admitting: Gynecology

## 2012-07-17 NOTE — Telephone Encounter (Signed)
Patient called in voice mail. Had surgery in June. Now c/o "pressure, like a tampon trying to come out" of vagina.  Patient questioned if this might be a problem and should she come in.  I called her cell phone back and got voice mail and told her to schedule appointment for exam to assess problem.  I let Lupita Leash know she will be calling for appointment.

## 2012-07-18 ENCOUNTER — Ambulatory Visit (INDEPENDENT_AMBULATORY_CARE_PROVIDER_SITE_OTHER): Payer: 59 | Admitting: Gynecology

## 2012-07-18 ENCOUNTER — Encounter: Payer: Self-pay | Admitting: Gynecology

## 2012-07-18 DIAGNOSIS — R32 Unspecified urinary incontinence: Secondary | ICD-10-CM

## 2012-07-18 DIAGNOSIS — N390 Urinary tract infection, site not specified: Secondary | ICD-10-CM

## 2012-07-18 LAB — URINALYSIS W MICROSCOPIC + REFLEX CULTURE
Ketones, ur: NEGATIVE mg/dL
Nitrite: NEGATIVE
Protein, ur: NEGATIVE mg/dL
Urobilinogen, UA: 0.2 mg/dL (ref 0.0–1.0)

## 2012-07-18 MED ORDER — NITROFURANTOIN MONOHYD MACRO 100 MG PO CAPS: 100.0000 mg | ORAL_CAPSULE | Freq: Two times a day (BID) | ORAL | Status: DC

## 2012-07-18 NOTE — Patient Instructions (Signed)
Take antibiotics as prescribed. Follow up for recheck of urinalysis in several weeks.

## 2012-07-18 NOTE — Progress Notes (Signed)
Patient presents having undergone total vaginal hysterectomy 04/08/2012. Postoperative course complicated by vaginal cuff hematoma which has resolved. She had been treated for UTI with follow up urinalysis 05/15/2012 normal.  Returned with urinary symptoms mid-August with culture ultimately negative and was treated with pushing fluids. She notes complete resolution of all of her symptoms she was previously having to include pelvic pressure due to the cuff hematoma and urinary type symptoms. The last week or so she's had a return of suprapubic discomfort and pelvic pressure feeling like something is coming out of the vaginal area. She's also having some dysuria and occasional urinary incontinence. No fever chills nausea vomiting. Regular bowel movements and overall feeling well back to work and planning vacations.  Exam with [facility] assistant Spine straight without CVA tenderness. Abdomen soft nontender without masses guarding rebound organomegaly.  Pelvic external BUS vagina normal with some mild pelvic relaxation but no overt cystocele/rectocele. Cuff intact well-healed. Bimanual without masses or tenderness other than some suprapubic discomfort. Rectovaginal exam is normal.  Urinalysis: 3-6 WBC 7-10 RBC few bacteria.  Assessment and plan: Urinary symptoms suspect UTI. Will check culture and cover with Macrobid 100 twice a day x7 days. Exam is normal without evidence of cuff hematoma or other abnormalities.  I reviewed her recurrent urinary symptoms since the surgery and whether it's related to the surgery and/or healing or independent.  Doubt a more significant issue as she is otherwise asymptomatic feeling well without pain/fever or persistent symptoms.  If symptoms would persist may consider more involved study to include possible ultrasound or CT.

## 2012-07-20 LAB — URINE CULTURE: Colony Count: NO GROWTH

## 2012-07-21 ENCOUNTER — Other Ambulatory Visit: Payer: Self-pay | Admitting: Gynecology

## 2012-07-21 ENCOUNTER — Other Ambulatory Visit: Payer: Self-pay | Admitting: *Deleted

## 2012-07-21 DIAGNOSIS — R3129 Other microscopic hematuria: Secondary | ICD-10-CM

## 2012-07-21 DIAGNOSIS — D72829 Elevated white blood cell count, unspecified: Secondary | ICD-10-CM

## 2012-07-21 MED ORDER — HYDROCHLOROTHIAZIDE 25 MG PO TABS: 12.5000 mg | ORAL_TABLET | Freq: Every day | ORAL | Status: DC

## 2012-07-22 ENCOUNTER — Ambulatory Visit (INDEPENDENT_AMBULATORY_CARE_PROVIDER_SITE_OTHER): Payer: 59 | Admitting: *Deleted

## 2012-07-22 ENCOUNTER — Telehealth: Payer: Self-pay | Admitting: Family Medicine

## 2012-07-22 VITALS — BP 110/82 | HR 84 | Wt 175.5 lb

## 2012-07-22 DIAGNOSIS — I10 Essential (primary) hypertension: Secondary | ICD-10-CM

## 2012-07-22 NOTE — Telephone Encounter (Signed)
Called patient and left message. Reviewed today's weight and BP from nurse visit. Reiterated on the phone message that patient is to call clinic if she develops any shortness of breath, chest pain on the phentermine. Also reiterated that this will be short course and that I encourage her to increase her exercise level.

## 2012-07-22 NOTE — Progress Notes (Signed)
Patient in for weight and  BP check. BP checked manually  With regular adult cuff. BP 11/82 pulse 84.

## 2012-07-30 ENCOUNTER — Other Ambulatory Visit: Payer: 59

## 2012-07-30 ENCOUNTER — Ambulatory Visit (INDEPENDENT_AMBULATORY_CARE_PROVIDER_SITE_OTHER): Payer: 59 | Admitting: *Deleted

## 2012-07-30 ENCOUNTER — Telehealth: Payer: Self-pay | Admitting: Family Medicine

## 2012-07-30 VITALS — BP 120/88 | HR 80 | Wt 174.4 lb

## 2012-07-30 DIAGNOSIS — I10 Essential (primary) hypertension: Secondary | ICD-10-CM

## 2012-07-30 NOTE — Progress Notes (Signed)
Patient in for BP check. Patient picked up new RX for HCTZ last week . The RX was for 12.5 mg. States she has always been on  HCTZ 25 mg.  Has taken for past week.   BP today 120/88 bilaterally pulse 80. Will send message to Dr. Gwenlyn Saran to please advise about dosage. Call back # [phone]

## 2012-07-30 NOTE — Telephone Encounter (Signed)
Called patient to check in about her BP med dose. Continue HCTZ 25mg  daily like she has been doing. BP was mildly elevated today with diastolic of 88. Patient is to monitor BP at home and let me know if it becomes persistently elevated. Repeat BP check with nurse next week. Also talked about increasing exercise while changing diet. Continue phentermine at current 15mg  dose. Plan on stopping after 8 week course.

## 2012-07-31 ENCOUNTER — Other Ambulatory Visit: Payer: 59

## 2012-07-31 DIAGNOSIS — R3129 Other microscopic hematuria: Secondary | ICD-10-CM

## 2012-07-31 DIAGNOSIS — D72829 Elevated white blood cell count, unspecified: Secondary | ICD-10-CM

## 2012-07-31 NOTE — Progress Notes (Signed)
Paged Dr. Gwenlyn Saran earlier today  to ask about phone note because she spoke with patient yesterday. and she states patient reported she has been on  HCTZ  25 mg .

## 2012-08-01 LAB — URINALYSIS W MICROSCOPIC + REFLEX CULTURE
Casts: NONE SEEN
Glucose, UA: NEGATIVE mg/dL
Ketones, ur: NEGATIVE mg/dL
Protein, ur: NEGATIVE mg/dL
pH: 6 (ref 5.0–8.0)

## 2012-08-02 LAB — URINE CULTURE: Colony Count: 7000

## 2012-08-06 ENCOUNTER — Ambulatory Visit (INDEPENDENT_AMBULATORY_CARE_PROVIDER_SITE_OTHER): Payer: 59 | Admitting: *Deleted

## 2012-08-06 VITALS — BP 122/61 | HR 75 | Wt 178.5 lb

## 2012-08-06 DIAGNOSIS — I10 Essential (primary) hypertension: Secondary | ICD-10-CM

## 2012-08-13 ENCOUNTER — Ambulatory Visit (INDEPENDENT_AMBULATORY_CARE_PROVIDER_SITE_OTHER): Payer: 59 | Admitting: Family Medicine

## 2012-08-13 ENCOUNTER — Encounter: Payer: Self-pay | Admitting: Family Medicine

## 2012-08-13 VITALS — BP 122/84 | HR 86 | Ht 66.0 in | Wt 175.0 lb

## 2012-08-13 DIAGNOSIS — E663 Overweight: Secondary | ICD-10-CM

## 2012-08-13 MED ORDER — PHENTERMINE HCL 15 MG PO CAPS: 15.0000 mg | ORAL_CAPSULE | Freq: Two times a day (BID) | ORAL | Status: DC

## 2012-08-13 NOTE — Patient Instructions (Signed)
I'll see you back in 3 weeks. Check your blood pressure at home and if it's greater than 130/90, call the clinic.

## 2012-08-14 NOTE — Progress Notes (Signed)
Patient ID: Gina Wise    DOB: 15-Jan-1961, 51 y.o.   MRN: [id number] --- Subjective:  Gina Wise is a 51 y.o.female who presents for follow up on weight loss. Currently on phentermine 15mg  daily. She denies any shortness of breath, any chest pain, or heart palpitations.  Weight loss: has been exercising more vigorously than at her previous visit. She is taking the stairs up to work more vigorously. She walks 1 mile around her work at lunch time every day, enough to break a sweat. Diet wise, she continues to cut down on carbs and sweet foods. She is less hungry after taking medicine in morning and more hungry in the afternoon.    ROS: see HPI Past Medical History: reviewed and updated medications and allergies. Social History: Tobacco: denies  Objective: Filed Vitals:   08/13/12 1456  BP: 122/84  Pulse: 86    Physical Examination:   General appearance - alert, well appearing, and in no distress Chest - clear to auscultation, no wheezes, rales or rhonchi, symmetric air entry Heart - normal rate, regular rhythm, normal S1, S2, no murmurs, rubs, clicks or gallops Abdomen - soft, nontender, nondistended, no masses or organomegaly Extremities - peripheral pulses normal, no pedal edema

## 2012-08-15 ENCOUNTER — Telehealth: Payer: Self-pay | Admitting: Family Medicine

## 2012-08-15 NOTE — Telephone Encounter (Signed)
Will fwd. To Dr.Losq to address. Gina Wise, Gina Wise

## 2012-08-15 NOTE — Telephone Encounter (Signed)
Waiting for pt to call back. Ned to clarify which medication she is talking about. The only medication from last OV that I see is Phentermine 15 mg.  Lorenda Hatchet, Renato Battles

## 2012-08-15 NOTE — Telephone Encounter (Signed)
Called pt.

## 2012-08-15 NOTE — Telephone Encounter (Signed)
The pharmacy have 37.5 mg of the Phentermine they can give for 30 day supply for $54 less than the 15mg .  So Gina Wise need the rx written for the 37.5 and she will return the one for the 15mg .

## 2012-08-15 NOTE — Telephone Encounter (Signed)
Caregiver is returning call to [facility].

## 2012-08-15 NOTE — Telephone Encounter (Signed)
Patient is calling about the Rx that was written the other day.  The way the Rx has been written will cost $90, but she would like the Rx changed to the 37.5 so that she can get it at the $30 less cost.  She also wants the doctor to know she has scheduled her f/u appt.  She would like a call when the rx is ready to be picked.

## 2012-08-17 NOTE — Assessment & Plan Note (Signed)
Patient has lost 2 lbs since beginning of treatment with 15mg  phentermine in mid October. Blood pressure continues to be stable and patient denies any cardiopulmonary symptoms. Will try adding afternoon dose 15mg  phentermine to morning dose and will reassess in 3 weeks. At that time, will stop medication and continue to focus on diet and exercise changes. Patient is to monitor blood pressure at home and call if BP greater than 130/90.

## 2012-08-29 ENCOUNTER — Encounter: Payer: 59 | Admitting: Gynecology

## 2012-09-10 ENCOUNTER — Ambulatory Visit: Payer: 59 | Admitting: Family Medicine

## 2012-09-12 ENCOUNTER — Ambulatory Visit (INDEPENDENT_AMBULATORY_CARE_PROVIDER_SITE_OTHER): Payer: 59 | Admitting: Gynecology

## 2012-09-12 ENCOUNTER — Encounter: Payer: Self-pay | Admitting: Gynecology

## 2012-09-12 VITALS — BP 122/80 | Ht 65.5 in | Wt 173.0 lb

## 2012-09-12 DIAGNOSIS — N816 Rectocele: Secondary | ICD-10-CM

## 2012-09-12 DIAGNOSIS — Z01419 Encounter for gynecological examination (general) (routine) without abnormal findings: Secondary | ICD-10-CM

## 2012-09-12 NOTE — Progress Notes (Signed)
Gina Wise 1961-07-08 [id number]        51 y.o.  [name] for annual exam.    Past medical history,surgical history, medications, allergies, family history and social history were all reviewed and documented in the EPIC chart. ROS:  Was performed and pertinent positives and negatives are included in the history.  Exam: Gina Wise assistant Filed Vitals:   09/12/12 1516  BP: 122/80  Height: 5' 5.5" (1.664 m)  Weight: 173 lb (78.472 kg)   General appearance  Normal Skin grossly normal Head/Neck normal with no cervical or supraclavicular adenopathy thyroid normal Lungs  clear Cardiac RR, without RMG Abdominal  soft, nontender, without masses, organomegaly or hernia Breasts  examined lying and sitting without masses, retractions, discharge or axillary adenopathy. Pelvic  Ext/BUS/vagina  normal with first degree rectocele  Adnexa  Without masses or tenderness    Anus and perineum  normal   Rectovaginal  normal sphincter tone without palpated masses or tenderness.    Assessment/Plan:  51 y.o. [name] female for annual exam.   1. History of vaginal hysterectomy 03/2012. Cuff hematoma afterwards with ultrasound showing 22 x 34 collection. Was having pain and pressure. Pain has resolved. She does note some pressure symptoms like a tampon coming out. She does have a rectocele first degree with exam. Does not note any stool trapping. Cuff well supported no gross evidence of cystocele.  Options for management to include ultrasound to reassess the vaginal cuff for remaining hematoma or collection reviewed. Her exam is normal without palpable abnormalities. Patient preferred just to watch at present, that she is not overly bothered by the symptoms. Overall she's feeling great. She has not resumed intercourse generally fearful for pain and I've reinforced the she is ready to do so at her discretion. 2. Pap smear. Last Pap smear 12/2010. No Pap done today. I discussed current screening guidelines and she has  no history of abnormal Pap smears in the options to stop altogether she is status post hysterectomy for benign indication versus less frequent screening intervals and we'll readdress on an annual basis. 3. Mammography. Patient way overdue with last mammogram reported 2010. She understands the benefits of early detection and my strong recommendation to schedule mammogram. SBE monthly reviewed. 4. Colonoscopy. She had one 8 years ago with recommended repeat at age 70 and I encouraged her to schedule this. 5. Bone density. We'll wait further into the 50s to schedule. 6. Health maintenance. 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 Follow up one year, sooner as needed.   Gina Lords MD, 4:50 PM 09/12/2012

## 2012-09-12 NOTE — Patient Instructions (Signed)
Call to Schedule your mammogram  Facilities in [place]: 1)  [facility], [address] Phone: [phone] 2)  [facility]. [facility], [address] Phone: [phone] 3)  Dr. Bertrand at [facility]  [address] Phone: [phone]     Mammogram A mammogram is an X-ray test to find changes in a woman's breast. You should get a mammogram if:  You are 50 years of age or older  You have risk factors.   Your doctor recommends that you have one.  BEFORE THE TEST  Do not schedule the test the week before your period, especially if your breasts are sore during this time.  On the day of your mammogram:  Wash your breasts and armpits well. After washing, do not put on any deodorant or talcum powder on until after your test.   Eat and drink as you usually do.   Take your medicines as usual.   If you are diabetic and take insulin, make sure you:   Eat before coming for your test.   Take your insulin as usual.   If you cannot keep your appointment, call before the appointment to cancel. Schedule another appointment.  TEST  You will need to undress from the waist up. You will put on a hospital gown.   Your breast will be put on the mammogram machine, and it will press firmly on your breast with a piece of plastic called a compression paddle. This will make your breast flatter so that the machine can X-ray all parts of your breast.   Both breasts will be X-rayed. Each breast will be X-rayed from above and from the side. An X-ray might need to be taken again if the picture is not good enough.   The mammogram will last about 15 to 30 minutes.  AFTER THE TEST Finding out the results of your test Ask when your test results will be ready. Make sure you get your test results.  Document Released: 01/04/2009 Document Revised: 09/27/2011 Document Reviewed: 01/04/2009 [employer] Patient  Information 2012 [employer].   

## 2012-10-12 ENCOUNTER — Other Ambulatory Visit: Payer: Self-pay | Admitting: Family Medicine

## 2012-10-24 ENCOUNTER — Ambulatory Visit (INDEPENDENT_AMBULATORY_CARE_PROVIDER_SITE_OTHER): Payer: 59 | Admitting: Family Medicine

## 2012-10-24 ENCOUNTER — Encounter: Payer: Self-pay | Admitting: Family Medicine

## 2012-10-24 VITALS — BP 118/84 | HR 74 | Temp 98.4°F | Ht 66.0 in | Wt 184.0 lb

## 2012-10-24 DIAGNOSIS — R51 Headache: Secondary | ICD-10-CM

## 2012-10-24 DIAGNOSIS — R519 Headache, unspecified: Secondary | ICD-10-CM | POA: Insufficient documentation

## 2012-10-24 MED ORDER — IBUPROFEN 600 MG PO TABS: 600.0000 mg | ORAL_TABLET | Freq: Three times a day (TID) | ORAL | Status: DC | PRN

## 2012-10-24 MED ORDER — PROMETHAZINE HCL 12.5 MG PO TABS: 12.5000 mg | ORAL_TABLET | Freq: Three times a day (TID) | ORAL | Status: DC | PRN

## 2012-10-24 MED ORDER — KETOROLAC TROMETHAMINE 60 MG/2ML IM SOLN
60.0000 mg | Freq: Once | INTRAMUSCULAR | Status: AC
  Administered 2012-10-24: 60 mg via INTRAMUSCULAR

## 2012-10-24 NOTE — Patient Instructions (Addendum)
You have no signs of serious cause of migraine. You may have an early sinus infection or viral syndrome. If your headache gets worse, then go to ER. You may try motrin or phenergan for mild headache this evening. Make appointment for check up in one week. If you have weakness, numbness, speech problems or worsening, go to ER.   Headache Headaches are caused by many different problems. Most commonly, headache is caused by muscle tension from an injury, fatigue, or emotional upset. Excessive muscle contractions in the scalp and neck result in a headache that often feels like a tight band around the head. Tension headaches often have areas of tenderness over the scalp and the back of the neck. These headaches may last for hours, days, or longer, and some may contribute to migraines in those who have migraine problems. Migraines usually cause a throbbing headache, which is made worse by activity. Sometimes only one side of the head hurts. Nausea, vomiting, eye pain, and avoidance of food are common with migraines. Visual symptoms such as light sensitivity, blind spots, or flashing lights may also occur. Loud noises may worsen migraine headaches. Many factors may cause migraine headaches:  Emotional stress, lack of sleep, and menstrual periods.   Alcohol and some drugs (such as birth control pills).   Diet factors (fasting, caffeine, food preservatives, chocolate).   Environmental factors (weather changes, bright lights, odors, smoke).  Other causes of headaches include minor injuries to the head. Arthritis in the neck; problems with the jaw, eyes, ears, or nose are also causes of headaches. Allergies, drugs, alcohol, and exposure to smoke can also cause moderate headaches. Rebound headaches can occur if someone uses pain medications for a long period of time and then stops. Less commonly, blood vessel problems in the neck and brain (including stroke) can cause various types of headache. Treatment of  headaches includes medicines for pain and relaxation. Ice packs or heat applied to the back of the head and neck help some people. Massaging the shoulders, neck and scalp are often very useful. Relaxation techniques and stretching can help prevent these headaches. Avoid alcohol and cigarette smoking as these tend to make headaches worse. Please see your caregiver if your headache is not better in 2 days.  SEEK IMMEDIATE MEDICAL CARE IF:   You develop a high fever, chills, or repeated vomiting.   You faint or have difficulty with vision.   You develop unusual numbness or weakness of your arms or legs.   Relief of pain is inadequate with medication, or you develop severe pain.   You develop confusion, or neck stiffness.   You have a worsening of a headache or do not obtain relief.  Document Released: 10/08/2005 Document Revised: 09/27/2011 Document Reviewed: 04/03/2007 [facility] Patient Information 2012 [place], [place].

## 2012-10-24 NOTE — Assessment & Plan Note (Addendum)
Not typical of her chronic migraine, could be a tension headache or onset of early viral illness with slight sore throat. No definite sign of bacterial sinus infection, no red flag of serious intracranial pathology such as nuchal rigidity or thunderclap. Her pain is improved immediately to 6/10 with toradol injection in office and is very well appearing. We discussed continuing supportive care and observation. She may try motrin or phenergan if returns. Fu in one week with PCP. If she develops any neurologic sign, fever or worsens she agrees to seek emergency care.   Case d/w Dr. Sheffield Slider.

## 2012-10-24 NOTE — Progress Notes (Signed)
  Subjective:    Patient ID: Gina Wise, female    DOB: [birth year], 52 y.o.   MRN: [id number]  HPI  1. Headache. 52 yo female with hx of migraines. Recently have been well controlled. Current headache started 2 days ago Wednesday afternoon, started gradually in while doing housework. Worsened on Thursday, but stable today. Not improved by sleep. She thinks is different than her typical migraine. Described as 10/10, Generalized pain, not localized. No known triggers. +associated slight photophobia and with phonophobia, +fatigue. No nausea or emesis. There is some bilateral eye pain, and a slight scratchy throat.   Has tried aspirin and tylenol pm, dramamine without improvement. Wants to go to work, so requesting stronger medication. No known sick contacts.  Review of Systems Denies neck pain/stiffness, fever, visual deficits, confusion, weakness, numbness, speech difficulty, falls, trauma, sinus pain, ear pain, rash.     Objective:   Physical Exam  Vitals reviewed. Constitutional: She is oriented to person, place, and time. She appears well-developed and well-nourished. No distress.       Appears well. Lights off in exam room, but okay when turned on.  HENT:  Head: Normocephalic and atraumatic.       No facial TTP or sinus TTP.  OP clear.  Eyes: Conjunctivae normal and EOM are normal. Pupils are equal, round, and reactive to light. Right eye exhibits no discharge. Left eye exhibits no discharge. No scleral icterus.       Slight pain with superior and inferior EOM, none with horizontal.  Neck: Normal range of motion. Neck supple. No thyromegaly present.  Cardiovascular: Normal rate, regular rhythm and normal heart sounds.   Pulmonary/Chest: Effort normal.  Lymphadenopathy:    She has no cervical adenopathy.  Neurological: She is alert and oriented to person, place, and time. No cranial nerve deficit. She exhibits normal muscle tone. Coordination normal.       Normal UE and LE strenght.  Finger-nose intact. CN II-XII intact. Normal gait and sensation.  Skin: No rash noted. She is not diaphoretic.  Psychiatric: She has a normal mood and affect.       Assessment & Plan:

## 2012-12-29 ENCOUNTER — Other Ambulatory Visit: Payer: Self-pay | Admitting: *Deleted

## 2012-12-31 MED ORDER — HYDROCHLOROTHIAZIDE 25 MG PO TABS: 12.5000 mg | ORAL_TABLET | Freq: Every day | ORAL | Status: DC

## 2013-01-02 ENCOUNTER — Ambulatory Visit (INDEPENDENT_AMBULATORY_CARE_PROVIDER_SITE_OTHER): Payer: 59 | Admitting: Family Medicine

## 2013-01-02 VITALS — BP 132/89 | HR 103 | Temp 97.6°F | Ht 66.0 in | Wt 172.0 lb

## 2013-01-02 DIAGNOSIS — R059 Cough, unspecified: Secondary | ICD-10-CM

## 2013-01-02 DIAGNOSIS — R05 Cough: Secondary | ICD-10-CM

## 2013-01-02 DIAGNOSIS — R053 Chronic cough: Secondary | ICD-10-CM | POA: Insufficient documentation

## 2013-01-02 MED ORDER — HYDROCODONE-HOMATROPINE 5-1.5 MG/5ML PO SYRP: 5.0000 mL | ORAL_SOLUTION | Freq: Three times a day (TID) | ORAL | Status: DC | PRN

## 2013-01-02 MED ORDER — BENZONATATE 200 MG PO CAPS: 200.0000 mg | ORAL_CAPSULE | Freq: Two times a day (BID) | ORAL | Status: DC | PRN

## 2013-01-02 NOTE — Assessment & Plan Note (Signed)
Unclear etiology but likely triggered by environmental stresses. Lungs sound great and no history of asthma COPD. No smoking history. Treat with Tessalon Perles during the day as well as Hycodan at nighttime. I discussed treating this with steroids but she has had trouble with gum bleeding and would prefer not steroids. Over-the-counter antihistamine such as Zyrtec recommended. Followup early next week if no improvement.

## 2013-01-02 NOTE — Patient Instructions (Signed)
Try the Cetirizine (Zyrtec) daily.  The other medications are Tessalon perles and Hycodan.  Use the Tessalon during the day.  These are tablets.  Use the cough syrup at night.    Let me know next week how you're doing.

## 2013-01-02 NOTE — Progress Notes (Signed)
Gina Wise is a 52 y.o. female who presents to [facility] today with complaints of persistent cough:  1.  Cough:  Dry hacking cough which has been present for 1 week.  Disrupts work and sleep.  She has tried multiple over-the-counter cough drops and cough suppressants. None of these have provided her any relief. She did move into a new house about 2 weeks ago. She does have a problem with seasonal allergies. She denies any other upper respiratory symptoms. No nasal congestion, rhinorrhea, dry itchy eyes. No history of asthma. No fevers or chills. No weight loss.  The following portions of the patient's history were reviewed and updated as appropriate: allergies, current medications, past medical history, family and social history, and problem list.  Patient is a nonsmoker.    Past Medical History  Diagnosis Date  . Hypothyroid   . Depression   . Overweight   . Migraines   . Hypertension   . Peri-menopause   . Anemia    Past Surgical History  Procedure Laterality Date  . Tonsillectomy    . Rotator cuff repair      left  . Shoulder surgery  [id number]  . Exploratory laparotomy  1987  . Inguinal hernia repair    . Appendectomy  1987  . Tubal ligation  1998    laparoscopic  . Vaginal hysterectomy  04/08/2012    menorrhagia    Medications reviewed. Current Outpatient Prescriptions  Medication Sig Dispense Refill  . acetaminophen (TYLENOL) 500 MG tablet Take 500 mg by mouth every 6 (six) hours as needed.      Marland Kitchen aspirin 325 MG tablet Take 325 mg by mouth daily as needed. For pain      . Cyanocobalamin (VITAMIN B 12 PO) Take by mouth.      . hydrochlorothiazide (HYDRODIURIL) 25 MG tablet Take 0.5 tablets (12.5 mg total) by mouth daily.  15 tablet  3  . ibuprofen (ADVIL,MOTRIN) 600 MG tablet Take 1 tablet (600 mg total) by mouth every 8 (eight) hours as needed for pain.  20 tablet  0  . levothyroxine (SYNTHROID, LEVOTHROID) 88 MCG tablet Take 88 mcg by mouth daily.      Marland Kitchen levothyroxine  (SYNTHROID, LEVOTHROID) 88 MCG tablet TAKE ONE TABLET BY MOUTH ONE TIME DAILY  30 tablet  3  . loratadine (CLARITIN) 10 MG tablet TAKE ONE TABLET BY MOUTH ONE TIME DAILY  30 tablet  9  . Multiple Vitamins-Minerals (WOMENS MULTIVITAMIN PLUS) TABS Take 1 tablet by mouth daily.        Marland Kitchen OVER THE COUNTER MEDICATION Supplements - thryoid and multivitamins      . promethazine (PHENERGAN) 12.5 MG tablet Take 1-2 tablets (12.5-25 mg total) by mouth every 8 (eight) hours as needed for nausea.  20 tablet  0  . sertraline (ZOLOFT) 100 MG tablet TAKE ONE TABLET BY MOUTH ONE TIME DAILY  30 tablet  10   No current facility-administered medications for this visit.    ROS as above otherwise neg.  No chest pain, palpitations, SOB, Fever, Chills, Abd pain, N/V/D.  Physical Exam:  BP 132/89  Pulse 103  Temp(Src) 97.6 F (36.4 C) (Oral)  Ht 5\' 6"  (1.676 m)  Wt 172 lb (78.019 kg)  BMI 27.77 kg/m2  LMP 04/03/2012 Gen:  Alert, cooperative patient who appears stated age in no acute distress.  Vital signs reviewed. Multiple episodes of coughing spells which prevent her from talking with me. These are dry persistent coughs. HEENT: EOMI,  MMM  Pulm:  Clear to auscultation bilaterally with good air movement.  No wheezes or rales noted.   Cardiac:  Regular rate and rhythm without murmur auscultated.  Good S1/S2. Exts: Non edematous BL  LE, warm and well perfused.   No results found for this or any previous visit (from the past 72 hour(s)).

## 2013-03-27 ENCOUNTER — Encounter (HOSPITAL_BASED_OUTPATIENT_CLINIC_OR_DEPARTMENT_OTHER): Payer: Self-pay | Admitting: Family Medicine

## 2013-03-27 ENCOUNTER — Emergency Department (HOSPITAL_BASED_OUTPATIENT_CLINIC_OR_DEPARTMENT_OTHER)
Admission: EM | Admit: 2013-03-27 | Discharge: 2013-03-27 | Disposition: A | Discharge status: Home / Self Care | Payer: 59 | Attending: Emergency Medicine | Admitting: Emergency Medicine

## 2013-03-27 DIAGNOSIS — J3489 Other specified disorders of nose and nasal sinuses: Secondary | ICD-10-CM | POA: Insufficient documentation

## 2013-03-27 DIAGNOSIS — Z862 Personal history of diseases of the blood and blood-forming organs and certain disorders involving the immune mechanism: Secondary | ICD-10-CM | POA: Insufficient documentation

## 2013-03-27 DIAGNOSIS — I10 Essential (primary) hypertension: Secondary | ICD-10-CM | POA: Insufficient documentation

## 2013-03-27 DIAGNOSIS — E039 Hypothyroidism, unspecified: Secondary | ICD-10-CM | POA: Insufficient documentation

## 2013-03-27 DIAGNOSIS — F3289 Other specified depressive episodes: Secondary | ICD-10-CM | POA: Insufficient documentation

## 2013-03-27 DIAGNOSIS — F329 Major depressive disorder, single episode, unspecified: Secondary | ICD-10-CM | POA: Insufficient documentation

## 2013-03-27 DIAGNOSIS — G8929 Other chronic pain: Secondary | ICD-10-CM | POA: Insufficient documentation

## 2013-03-27 DIAGNOSIS — R519 Headache, unspecified: Secondary | ICD-10-CM

## 2013-03-27 DIAGNOSIS — R0982 Postnasal drip: Secondary | ICD-10-CM | POA: Insufficient documentation

## 2013-03-27 DIAGNOSIS — G43909 Migraine, unspecified, not intractable, without status migrainosus: Secondary | ICD-10-CM | POA: Insufficient documentation

## 2013-03-27 DIAGNOSIS — E663 Overweight: Secondary | ICD-10-CM | POA: Insufficient documentation

## 2013-03-27 DIAGNOSIS — M542 Cervicalgia: Secondary | ICD-10-CM | POA: Insufficient documentation

## 2013-03-27 DIAGNOSIS — Z8742 Personal history of other diseases of the female genital tract: Secondary | ICD-10-CM | POA: Insufficient documentation

## 2013-03-27 DIAGNOSIS — R42 Dizziness and giddiness: Secondary | ICD-10-CM | POA: Insufficient documentation

## 2013-03-27 DIAGNOSIS — Z79899 Other long term (current) drug therapy: Secondary | ICD-10-CM | POA: Insufficient documentation

## 2013-03-27 MED ORDER — FLUTICASONE PROPIONATE 50 MCG/ACT NA SUSP: 2.0000 | Freq: Every day | NASAL | Status: DC

## 2013-03-27 MED ORDER — METOCLOPRAMIDE HCL 5 MG/ML IJ SOLN
10.0000 mg | Freq: Once | INTRAMUSCULAR | Status: AC
  Administered 2013-03-27: 10 mg via INTRAVENOUS
  Filled 2013-03-27: qty 2

## 2013-03-27 MED ORDER — SODIUM CHLORIDE 0.9 % IV BOLUS (SEPSIS)
1000.0000 mL | Freq: Once | INTRAVENOUS | Status: AC
  Administered 2013-03-27: 1000 mL via INTRAVENOUS

## 2013-03-27 MED ORDER — FEXOFENADINE HCL 180 MG PO TABS: 180.0000 mg | ORAL_TABLET | Freq: Every day | ORAL | Status: DC

## 2013-03-27 MED ORDER — DIPHENHYDRAMINE HCL 50 MG/ML IJ SOLN
25.0000 mg | Freq: Once | INTRAMUSCULAR | Status: AC
  Administered 2013-03-27: 25 mg via INTRAVENOUS
  Filled 2013-03-27: qty 1

## 2013-03-27 NOTE — ED Provider Notes (Signed)
History     CSN: [id number]  Arrival date & time 03/27/13  0714   First MD Initiated Contact with Patient 03/27/13 [phone]      Chief Complaint  Patient presents with  . Migraine    (Consider location/radiation/quality/duration/timing/severity/associated sxs/prior treatment) HPI Comments: Patient presents with a one-week history of headache. She states one week ago she developed a migraine like headache to the left side of her head. This went away the next day and started back again 4 days ago. She states it's gradually gotten worse since then. She describes as a throbbing pain to the left side of her head. She also has sinus pain to her left facial area. She has some chronic nasal congestion and allergy symptoms. She denies he fevers or chills. She has chronic neck pain but states it's not any different are normal neck pain. She denies any numbness or weakness in her extremities. She denies any vision changes. She's had some lightheadedness on standing. She denies any vertiginous symptoms.  Patient is a 52 y.o. female presenting with migraines.  Migraine Associated symptoms include headaches. Pertinent negatives include no chest pain, no abdominal pain and no shortness of breath.    Past Medical History  Diagnosis Date  . Hypothyroid   . Depression   . Overweight(278.02)   . Migraines   . Hypertension   . Peri-menopause   . Anemia     Past Surgical History  Procedure Laterality Date  . Tonsillectomy    . Rotator cuff repair      left  . Shoulder surgery  [id number]  . Exploratory laparotomy  1987  . Inguinal hernia repair    . Appendectomy  1987  . Tubal ligation  1998    laparoscopic  . Vaginal hysterectomy  04/08/2012    menorrhagia    Family History  Problem Relation Age of Onset  . Hypertension Father   . Emphysema Father   . Vision loss Father   . Alcohol abuse Father   . Hypertension Mother   . Heart disease Mother   . Hepatitis Mother     Hep C  .  Endometriosis Mother   . Lupus Sister   . Endometriosis Sister   . Breast cancer Maternal Aunt     30's  . Breast cancer Paternal Aunt     95's  . Cancer Paternal Uncle     lung smoker  . Cancer Maternal Grandfather     colon  . Cancer Maternal Aunt     uterine-50's  . Cancer Paternal Aunt     cervical-40's  . Cancer Paternal Uncle     lung cancer    History  Substance Use Topics  . Smoking status: Never Smoker   . Smokeless tobacco: Not on file  . Alcohol Use: Yes    OB History   Grav Para Term Preterm Abortions TAB SAB Ect Mult Living   2 2 2       2       Review of Systems  Constitutional: Negative for fever, chills, diaphoresis and fatigue.  HENT: Positive for congestion, neck pain (chronic and unchanged), postnasal drip and sinus pressure. Negative for rhinorrhea and sneezing.   Eyes: Negative.   Respiratory: Negative for cough, chest tightness and shortness of breath.   Cardiovascular: Negative for chest pain and leg swelling.  Gastrointestinal: Negative for nausea, vomiting, abdominal pain, diarrhea and blood in stool.  Genitourinary: Negative for frequency, hematuria, flank pain and difficulty urinating.  Musculoskeletal: Negative  for back pain and arthralgias.  Skin: Negative for rash.  Neurological: Positive for light-headedness and headaches. Negative for dizziness, speech difficulty, weakness and numbness.    Allergies  Codeine and Erythromycin  Home Medications   Current Outpatient Rx  Name  Route  Sig  Dispense  Refill  . hydrochlorothiazide (HYDRODIURIL) 25 MG tablet   Oral   Take 0.5 tablets (12.5 mg total) by mouth daily.   15 tablet   3   . levothyroxine (SYNTHROID, LEVOTHROID) 88 MCG tablet      TAKE ONE TABLET BY MOUTH ONE TIME DAILY   30 tablet   3   . sertraline (ZOLOFT) 100 MG tablet      TAKE ONE TABLET BY MOUTH ONE TIME DAILY   30 tablet   10   . acetaminophen (TYLENOL) 500 MG tablet   Oral   Take 500 mg by mouth every 6  (six) hours as needed.         Marland Kitchen aspirin 325 MG tablet   Oral   Take 325 mg by mouth daily as needed. For pain         . benzonatate (TESSALON) 200 MG capsule   Oral   Take 1 capsule (200 mg total) by mouth 2 (two) times daily as needed for cough.   20 capsule   1   . Cyanocobalamin (VITAMIN B 12 PO)   Oral   Take by mouth.         . fexofenadine (ALLEGRA) 180 MG tablet   Oral   Take 1 tablet (180 mg total) by mouth daily.   30 tablet   0   . fluticasone (FLONASE) 50 MCG/ACT nasal spray   Nasal   Place 2 sprays into the nose daily.   16 g   0   . HYDROcodone-homatropine (HYCODAN) 5-1.5 MG/5ML syrup   Oral   Take 5 mLs by mouth every 8 (eight) hours as needed for cough.   120 mL   0   . ibuprofen (ADVIL,MOTRIN) 600 MG tablet   Oral   Take 1 tablet (600 mg total) by mouth every 8 (eight) hours as needed for pain.   20 tablet   0   . levothyroxine (SYNTHROID, LEVOTHROID) 88 MCG tablet   Oral   Take 88 mcg by mouth daily.         Marland Kitchen loratadine (CLARITIN) 10 MG tablet      TAKE ONE TABLET BY MOUTH ONE TIME DAILY   30 tablet   9   . Multiple Vitamins-Minerals (WOMENS MULTIVITAMIN PLUS) TABS   Oral   Take 1 tablet by mouth daily.           Marland Kitchen OVER THE COUNTER MEDICATION      Supplements - thryoid and multivitamins         . promethazine (PHENERGAN) 12.5 MG tablet   Oral   Take 1-2 tablets (12.5-25 mg total) by mouth every 8 (eight) hours as needed for nausea.   20 tablet   0     BP 97/71  Pulse 78  Temp(Src) 97.4 F (36.3 C) (Oral)  Resp 16  Ht 5\' 6"  (1.676 m)  Wt 164 lb (74.39 kg)  BMI 26.48 kg/m2  SpO2 100%  LMP 04/03/2012  Physical Exam  Constitutional: She is oriented to person, place, and time. She appears well-developed and well-nourished.  HENT:  Head: Normocephalic and atraumatic.  Eyes: Pupils are equal, round, and reactive to light.  Neck: Normal range  of motion. Neck supple.  No meningeal signs  Cardiovascular: Normal  rate, regular rhythm and normal heart sounds.   Pulmonary/Chest: Effort normal and breath sounds normal. No respiratory distress. She has no wheezes. She has no rales. She exhibits no tenderness.  Abdominal: Soft. Bowel sounds are normal. There is no tenderness. There is no rebound and no guarding.  Musculoskeletal: Normal range of motion. She exhibits no edema.  Lymphadenopathy:    She has no cervical adenopathy.  Neurological: She is alert and oriented to person, place, and time. She has normal strength. No cranial nerve deficit or sensory deficit. GCS eye subscore is 4. GCS verbal subscore is 5. GCS motor subscore is 6.  Skin: Skin is warm and dry. No rash noted.  Psychiatric: She has a normal mood and affect.    ED Course  Procedures (including critical care time)  Labs Reviewed - No data to display No results found.   1. Headache       MDM  Patient's headache was relieved with a migraine cocktail. She still has some pressure under her left thigh over the maxillary sinus. She denies any pain or pressure to her eye itself. There is no pain with range of motion of her eyes. There is no vision changes. Her headache did feel somewhat similar to her past migraines. She has no unusual symptoms that would be suggestive of subarachnoid hemorrhage or meningitis. She was discharged home in good condition. I gave her some Flonase and Allegra prescriptions to use for possible sinus component to her headache.        Rolan Bucco, MD 03/27/13 1029

## 2013-03-27 NOTE — ED Notes (Signed)
Pt c/o migraine and dizziness, left facial "sinus pain" x 8 days. Pt reports h/o migraine.

## 2013-04-13 ENCOUNTER — Other Ambulatory Visit: Payer: Self-pay | Admitting: *Deleted

## 2013-04-13 MED ORDER — LEVOTHYROXINE SODIUM 88 MCG PO TABS: 88.0000 ug | ORAL_TABLET | Freq: Every day | ORAL | Status: DC

## 2013-04-13 MED ORDER — SERTRALINE HCL 100 MG PO TABS: 100.0000 mg | ORAL_TABLET | Freq: Every day | ORAL | Status: DC

## 2013-06-11 ENCOUNTER — Encounter (HOSPITAL_BASED_OUTPATIENT_CLINIC_OR_DEPARTMENT_OTHER): Payer: Self-pay | Admitting: *Deleted

## 2013-06-11 ENCOUNTER — Emergency Department (HOSPITAL_BASED_OUTPATIENT_CLINIC_OR_DEPARTMENT_OTHER): Payer: 59

## 2013-06-11 ENCOUNTER — Emergency Department (HOSPITAL_BASED_OUTPATIENT_CLINIC_OR_DEPARTMENT_OTHER)
Admission: EM | Admit: 2013-06-11 | Discharge: 2013-06-11 | Disposition: A | Discharge status: Home / Self Care | Payer: 59 | Attending: Emergency Medicine | Admitting: Emergency Medicine

## 2013-06-11 DIAGNOSIS — I10 Essential (primary) hypertension: Secondary | ICD-10-CM | POA: Insufficient documentation

## 2013-06-11 DIAGNOSIS — IMO0002 Reserved for concepts with insufficient information to code with codable children: Secondary | ICD-10-CM | POA: Insufficient documentation

## 2013-06-11 DIAGNOSIS — M542 Cervicalgia: Secondary | ICD-10-CM

## 2013-06-11 DIAGNOSIS — D649 Anemia, unspecified: Secondary | ICD-10-CM | POA: Insufficient documentation

## 2013-06-11 DIAGNOSIS — E039 Hypothyroidism, unspecified: Secondary | ICD-10-CM | POA: Insufficient documentation

## 2013-06-11 DIAGNOSIS — Z9089 Acquired absence of other organs: Secondary | ICD-10-CM | POA: Insufficient documentation

## 2013-06-11 DIAGNOSIS — Z79899 Other long term (current) drug therapy: Secondary | ICD-10-CM | POA: Insufficient documentation

## 2013-06-11 DIAGNOSIS — E663 Overweight: Secondary | ICD-10-CM | POA: Insufficient documentation

## 2013-06-11 DIAGNOSIS — R110 Nausea: Secondary | ICD-10-CM | POA: Insufficient documentation

## 2013-06-11 DIAGNOSIS — F3289 Other specified depressive episodes: Secondary | ICD-10-CM | POA: Insufficient documentation

## 2013-06-11 DIAGNOSIS — F329 Major depressive disorder, single episode, unspecified: Secondary | ICD-10-CM | POA: Insufficient documentation

## 2013-06-11 DIAGNOSIS — E049 Nontoxic goiter, unspecified: Secondary | ICD-10-CM | POA: Insufficient documentation

## 2013-06-11 DIAGNOSIS — Z8679 Personal history of other diseases of the circulatory system: Secondary | ICD-10-CM | POA: Insufficient documentation

## 2013-06-11 DIAGNOSIS — R0789 Other chest pain: Secondary | ICD-10-CM | POA: Insufficient documentation

## 2013-06-11 DIAGNOSIS — R6884 Jaw pain: Secondary | ICD-10-CM | POA: Insufficient documentation

## 2013-06-11 DIAGNOSIS — Z8742 Personal history of other diseases of the female genital tract: Secondary | ICD-10-CM | POA: Insufficient documentation

## 2013-06-11 LAB — COMPREHENSIVE METABOLIC PANEL
ALT: 15 U/L (ref 0–35)
CO2: 25 mEq/L (ref 19–32)
Calcium: 9.6 mg/dL (ref 8.4–10.5)
Chloride: 104 mEq/L (ref 96–112)
Creatinine, Ser: 0.7 mg/dL (ref 0.50–1.10)
GFR calc Af Amer: >90 mL/min (ref >90)
GFR calc non Af Amer: >90 mL/min (ref >90)
Glucose, Bld: 96 mg/dL (ref 70–99)
Total Bilirubin: 0.3 mg/dL (ref 0.3–1.2)

## 2013-06-11 LAB — CBC WITH DIFFERENTIAL/PLATELET
Eosinophils Relative: 3 % (ref 0–5)
HCT: 38.4 % (ref 36.0–46.0)
Hemoglobin: 13.3 g/dL (ref 12.0–15.0)
Lymphocytes Relative: 33 % (ref 12–46)
Lymphs Abs: 2.2 10*3/uL (ref 0.7–4.0)
MCV: 88.1 fL (ref 78.0–100.0)
Monocytes Absolute: 0.6 10*3/uL (ref 0.1–1.0)
Neutro Abs: 3.7 10*3/uL (ref 1.7–7.7)
RBC: 4.36 MIL/uL (ref 3.87–5.11)
WBC: 6.7 10*3/uL (ref 4.0–10.5)

## 2013-06-11 LAB — TROPONIN I: Troponin I: <0.3 ng/mL (ref <0.30)

## 2013-06-11 MED ORDER — ONDANSETRON HCL 4 MG/2ML IJ SOLN: 4.0000 mg | Freq: Once | INTRAMUSCULAR | Status: DC

## 2013-06-11 MED ORDER — MORPHINE SULFATE 4 MG/ML IJ SOLN: 4.0000 mg | Freq: Once | INTRAMUSCULAR | Status: DC

## 2013-06-11 MED ORDER — IOHEXOL 300 MG/ML  SOLN
75.0000 mL | Freq: Once | INTRAMUSCULAR | Status: AC | PRN
  Administered 2013-06-11: 75 mL via INTRAVENOUS

## 2013-06-11 MED ORDER — ASPIRIN 81 MG PO CHEW
324.0000 mg | CHEWABLE_TABLET | Freq: Once | ORAL | Status: AC
  Administered 2013-06-11: 324 mg via ORAL
  Filled 2013-06-11: qty 4

## 2013-06-11 MED ORDER — ONDANSETRON HCL 4 MG PO TABS: 4.0000 mg | ORAL_TABLET | Freq: Four times a day (QID) | ORAL | Status: DC

## 2013-06-11 MED ORDER — HYDROCODONE-ACETAMINOPHEN 5-325 MG PO TABS: 2.0000 | ORAL_TABLET | ORAL | Status: DC | PRN

## 2013-06-11 MED ORDER — IBUPROFEN 800 MG PO TABS: 800.0000 mg | ORAL_TABLET | Freq: Three times a day (TID) | ORAL | Status: DC

## 2013-06-11 NOTE — ED Notes (Signed)
States she has been having indigestion and epigastric discomfort for 2-3 days as well as left jaw pain that seems to coincide with the discomfort in chest  And indigestion Pt also reports nausea no vomiting

## 2013-06-11 NOTE — ED Provider Notes (Signed)
CSN: [id number]     Arrival date & time 06/11/13  1147 History     First MD Initiated Contact with Patient 06/11/13 1217     Chief Complaint  Patient presents with  . indigestion and jaw pain    (Consider location/radiation/quality/duration/timing/severity/associated sxs/prior Treatment) HPI Comments: Patient presents with a three-day history of constant left jaw pain it radiates into her neck and chest. She also reports nausea and "indigestion". This pain is constant, nothing makes it better or worse. She denies having this pain in the past. He is a history of hypertension high cholesterol is not treated. She also has a history of hypothyroidism but denies any recent medication changes. No difficulty breathing or swallowing. No fevers, focal weakness, numbness or tingling. Denies any dental pain or injury. She's never had a stress test.  The history is provided by the patient.    Past Medical History  Diagnosis Date  . Hypothyroid   . Depression   . Overweight(278.02)   . Migraines   . Hypertension   . Peri-menopause   . Anemia    Past Surgical History  Procedure Laterality Date  . Tonsillectomy    . Rotator cuff repair      left  . Shoulder surgery  [id number]  . Exploratory laparotomy  1987  . Inguinal hernia repair    . Appendectomy  1987  . Tubal ligation  1998    laparoscopic  . Vaginal hysterectomy  04/08/2012    menorrhagia   Family History  Problem Relation Age of Onset  . Hypertension Father   . Emphysema Father   . Vision loss Father   . Alcohol abuse Father   . Hypertension Mother   . Heart disease Mother   . Hepatitis Mother     Hep C  . Endometriosis Mother   . Lupus Sister   . Endometriosis Sister   . Breast cancer Maternal Aunt     30's  . Breast cancer Paternal Aunt     42's  . Cancer Paternal Uncle     lung smoker  . Cancer Maternal Grandfather     colon  . Cancer Maternal Aunt     uterine-50's  . Cancer Paternal Aunt     cervical-40's   . Cancer Paternal Uncle     lung cancer   History  Substance Use Topics  . Smoking status: Never Smoker   . Smokeless tobacco: Not on file  . Alcohol Use: Yes   OB History   Grav Para Term Preterm Abortions TAB SAB Ect Mult Living   2 2 2       2      Review of Systems  Constitutional: Negative for activity change and appetite change.  HENT: Positive for neck pain. Negative for congestion and rhinorrhea.   Respiratory: Positive for chest tightness. Negative for cough and shortness of breath.   Cardiovascular: Negative for chest pain.  Gastrointestinal: Negative for nausea, vomiting and abdominal pain.  Genitourinary: Negative for dysuria, hematuria, vaginal bleeding and vaginal discharge.  Musculoskeletal: Negative for back pain.  Skin: Negative for pallor and rash.  Neurological: Negative for dizziness, weakness, numbness and headaches.  A complete 10 system review of systems was obtained and all systems are negative except as noted in the HPI and PMH.    Allergies  Codeine and Erythromycin  Home Medications   Current Outpatient Rx  Name  Route  Sig  Dispense  Refill  . acetaminophen (TYLENOL) 500 MG tablet  Oral   Take 500 mg by mouth every 6 (six) hours as needed.         Marland Kitchen aspirin 325 MG tablet   Oral   Take 325 mg by mouth daily as needed. For pain         . benzonatate (TESSALON) 200 MG capsule   Oral   Take 1 capsule (200 mg total) by mouth 2 (two) times daily as needed for cough.   20 capsule   1   . Cyanocobalamin (VITAMIN B 12 PO)   Oral   Take by mouth.         . fexofenadine (ALLEGRA) 180 MG tablet   Oral   Take 1 tablet (180 mg total) by mouth daily.   30 tablet   0   . fluticasone (FLONASE) 50 MCG/ACT nasal spray   Nasal   Place 2 sprays into the nose daily.   16 g   0   . hydrochlorothiazide (HYDRODIURIL) 25 MG tablet   Oral   Take 0.5 tablets (12.5 mg total) by mouth daily.   15 tablet   3   . HYDROcodone-acetaminophen  (NORCO/VICODIN) 5-325 MG per tablet   Oral   Take 2 tablets by mouth every 4 (four) hours as needed for pain.   10 tablet   0   . HYDROcodone-homatropine (HYCODAN) 5-1.5 MG/5ML syrup   Oral   Take 5 mLs by mouth every 8 (eight) hours as needed for cough.   120 mL   0   . ibuprofen (ADVIL,MOTRIN) 600 MG tablet   Oral   Take 1 tablet (600 mg total) by mouth every 8 (eight) hours as needed for pain.   20 tablet   0   . ibuprofen (ADVIL,MOTRIN) 800 MG tablet   Oral   Take 1 tablet (800 mg total) by mouth 3 (three) times daily.   21 tablet   0   . levothyroxine (SYNTHROID, LEVOTHROID) 88 MCG tablet   Oral   Take 88 mcg by mouth daily.         Marland Kitchen levothyroxine (SYNTHROID, LEVOTHROID) 88 MCG tablet   Oral   Take 1 tablet (88 mcg total) by mouth daily before breakfast.   30 tablet   3   . loratadine (CLARITIN) 10 MG tablet      TAKE ONE TABLET BY MOUTH ONE TIME DAILY   30 tablet   9   . Multiple Vitamins-Minerals (WOMENS MULTIVITAMIN PLUS) TABS   Oral   Take 1 tablet by mouth daily.           . ondansetron (ZOFRAN) 4 MG tablet   Oral   Take 1 tablet (4 mg total) by mouth every 6 (six) hours.   12 tablet   0   . OVER THE COUNTER MEDICATION      Supplements - thryoid and multivitamins         . promethazine (PHENERGAN) 12.5 MG tablet   Oral   Take 1-2 tablets (12.5-25 mg total) by mouth every 8 (eight) hours as needed for nausea.   20 tablet   0   . sertraline (ZOLOFT) 100 MG tablet   Oral   Take 1 tablet (100 mg total) by mouth daily.   30 tablet   10    BP 124/67  Pulse 67  Temp(Src) 98.4 F (36.9 C) (Oral)  Resp 16  Ht 5\' 6"  (1.676 m)  Wt 171 lb (77.565 kg)  BMI 27.61 kg/m2  SpO2 99%  LMP 04/03/2012 Physical Exam  Constitutional: She is oriented to person, place, and time. She appears well-nourished. No distress.  HENT:  Head: Normocephalic and atraumatic.  Mouth/Throat: Oropharynx is clear and moist. No oropharyngeal exudate.  No dental  pain. Thyroid nontender  Eyes: Conjunctivae and EOM are normal. Pupils are equal, round, and reactive to light.  Neck: Normal range of motion. Neck supple. Thyromegaly present.  Cardiovascular: Regular rhythm and normal heart sounds.   Pulmonary/Chest: Effort normal and breath sounds normal. No respiratory distress.  Abdominal: Soft. There is no tenderness. There is no rebound and no guarding.  Musculoskeletal: Normal range of motion. She exhibits no edema and no tenderness.  Neurological: She is alert and oriented to person, place, and time. No cranial nerve deficit. She exhibits normal muscle tone. Coordination normal.  Skin: Skin is warm.    ED Course   Procedures (including critical care time)  Labs Reviewed  LIPASE, BLOOD - Abnormal; Notable for the following:    Lipase 68 (*)    All other components within normal limits  CBC WITH DIFFERENTIAL  COMPREHENSIVE METABOLIC PANEL  TROPONIN I  TROPONIN I  TSH  T4, FREE   Dg Chest 2 View  06/11/2013   *RADIOLOGY REPORT*  Clinical Data: Chest pain and short of breath  CHEST - 2 VIEW  Comparison: 03/28/2012  Findings: Heart size and vascularity are normal.  Lungs are clear without infiltrate or effusion.  Mild apical scarring bilaterally is unchanged.  IMPRESSION: No active cardiopulmonary abnormality.   Original Report Authenticated By: Janeece Riggers, M.D.   Ct Soft Tissue Neck W Contrast  06/11/2013   *RADIOLOGY REPORT*  Clinical Data: Left neck pain  CT NECK WITH CONTRAST  Technique:  Multidetector CT imaging of the neck was performed with intravenous contrast.  Contrast: 75mL OMNIPAQUE IOHEXOL 300 MG/ML  SOLN  Comparison: None.  Findings: Visualized intracranial contents and paranasal sinuses are clear.  Lung apices are clear.  The tongue is normal.  Pharyngeal tissues are normal.  The epiglottis and larynx are normal.  Submandibular and parotid glands are normal.  The thyroid is slightly heterogeneous without focal mass lesion.  Right  level II B lymph node 13 mm.  Just below this at the level the hyoid is of 6.4 mm lymph node.  Small level five nodes on the right.  Left level II node measures 12 x 16 mm.  6.2 mm low level II node. Small level five nodes on the left.  No dominant mass.  Negative for abscess.  Cervical spondylosis at C5-6 and C6-7.  There is facet degeneration.  IMPRESSION: Mild cervical adenopathy bilaterally.  No underlying mass lesion identified.  These are most likely reactive lymph nodes.  If they do not resolve with conservative treatment, consider follow up imaging or biopsy.   Original Report Authenticated By: Janeece Riggers, M.D.   1. Neck pain   2. Atypical chest pain     MDM  2 day history of constant jaw pain it radiates into the neck and chest.  Vitals stable, no distress.  EKG nonischemic.  Troponin negative. CXR negative.  Jaw pain has been constant x 2 days and appears atypical for ACS.  Patient does have risk factors of hyptertension and hypercholesterolemia.  EKG is normal and troponin is negative x2.  D/w [facility] residents Dr. Cliffton Asters and Dr. Casper Harrison who will arrange outpatient stress test for patient.  Patient is comfortable with delta troponin and follow up as outpatient.   Neck pain more  likely from mild cervical adenopathy. Thyroid studies pending. Follow up in clinic this week. Return precautions discussed.    Date: 06/11/2013  Rate: 71  Rhythm: normal sinus rhythm  QRS Axis: normal  Intervals: normal  ST/T Wave abnormalities: normal  Conduction Disutrbances:none  Narrative Interpretation:   Old EKG Reviewed: unchanged      Glynn Octave, MD 06/11/13 1727

## 2013-06-12 LAB — TSH: TSH: 1.519 u[IU]/mL (ref 0.350–4.500)

## 2013-06-15 ENCOUNTER — Encounter: Payer: Self-pay | Admitting: Emergency Medicine

## 2013-06-15 ENCOUNTER — Ambulatory Visit (INDEPENDENT_AMBULATORY_CARE_PROVIDER_SITE_OTHER): Payer: 59 | Admitting: Emergency Medicine

## 2013-06-15 VITALS — BP 138/83 | HR 84 | Ht 66.0 in | Wt 174.0 lb

## 2013-06-15 DIAGNOSIS — E785 Hyperlipidemia, unspecified: Secondary | ICD-10-CM | POA: Insufficient documentation

## 2013-06-15 DIAGNOSIS — F418 Other specified anxiety disorders: Secondary | ICD-10-CM

## 2013-06-15 DIAGNOSIS — R590 Localized enlarged lymph nodes: Secondary | ICD-10-CM | POA: Insufficient documentation

## 2013-06-15 DIAGNOSIS — R599 Enlarged lymph nodes, unspecified: Secondary | ICD-10-CM

## 2013-06-15 DIAGNOSIS — F341 Dysthymic disorder: Secondary | ICD-10-CM

## 2013-06-15 MED ORDER — ATORVASTATIN CALCIUM 20 MG PO TABS: 20.0000 mg | ORAL_TABLET | Freq: Every day | ORAL | Status: DC

## 2013-06-15 MED ORDER — BUSPIRONE HCL 7.5 MG PO TABS: 7.5000 mg | ORAL_TABLET | Freq: Two times a day (BID) | ORAL | Status: DC

## 2013-06-15 MED ORDER — CLINDAMYCIN HCL 300 MG PO CAPS: 300.0000 mg | ORAL_CAPSULE | Freq: Three times a day (TID) | ORAL | Status: DC

## 2013-06-15 NOTE — Progress Notes (Signed)
  Subjective:    Patient ID: Gina Wise, female    DOB: 14-Jan-1961, 52 y.o.   MRN: [id number]  HPI Kisha Messman is here for ED f/u.  ED follow up She was seen in the ED last Thursday for left jaw and neck pain.  She received a thorough evaluation that was largely negative, including EKG, CMP, CBC, thyroid studies, and troponins.  A CT of the neck showed some cervical LAD.  She was diagnosed with a viral process and discharged.  Today, she reports continued pain, located under her left jaw, better with pressure.  Denies pain by the ear or with chewing.  Does describe some dysphagia (solids = liquids).  Has also notice hoarseness and post nasal drainage.  Denies cough, rhinorrhea, shortness of breath.  Reports subjective fevers and chills, but has not had a documented fever.  Anxiety She reports lots of anxiety; has had multiple deaths of friends and family in the last year.  She is on Zoloft for depression.  Hyperlipidemia She gets annual blood work done for a wellness screen at work.  Lipids this year were Total 265, HDL 57, LDL 188.  A1c normal at 5.5%.  She has tried to manage this with diet and exercise, but would like to start a medicine at this time.  I have reviewed and updated the following as appropriate: allergies and current medications SHx: never smoker  Review of Systems See HPI    Objective:   Physical Exam BP 138/83  Pulse 84  Ht 5\' 6"  (1.676 m)  Wt 174 lb (78.926 kg)  BMI 28.1 kg/m2  LMP 04/03/2012 Gen: alert, cooperative, NAD HEENT: AT/[place], sclera white, MMM, no pharyngeal erythema or exudate Neck: supple, fullness greater on left without specific LAD CV: RRR, no murmurs Pulm: CTAB, no wheezes or rales     Assessment & Plan:

## 2013-06-15 NOTE — Assessment & Plan Note (Signed)
Recent lab work showed LDL of 188. Started atorvastatin 20mg  daily.

## 2013-06-15 NOTE — Assessment & Plan Note (Signed)
Has been on Zoloft for quite some time. Will add buspar 7.5mg  BID. Follow up with PCP in 2-4 weeks.

## 2013-06-15 NOTE — Patient Instructions (Addendum)
It was nice to meet you!  I think you probably have viral or bacterial infection that is causing you to have swollen lymph nodes.  These are then causing your pain.  I am going to treat you with an antibiotic called clindamycin.  We also started Buspar for anxiety and Atorvastatin for cholesterol.  Follow up in 1 week, if not improving. Please set up an appointment with your primary doctor in 2-4 weeks to see how things are going with the Buspar.

## 2013-06-15 NOTE — Assessment & Plan Note (Signed)
Likely cause for jaw/neck pain. No tenderness over the jaw or TMJ. Appear to be reactive nodes. CBC reviewed and no evidence for leukemia or lymphoma. Will start clindamycin 300mg  TID x7 days. Follow up in 1 week if not improving.

## 2013-07-01 ENCOUNTER — Telehealth: Payer: Self-pay | Admitting: Family Medicine

## 2013-07-01 NOTE — Telephone Encounter (Signed)
HCTZ 12.5mg  (25mg  tablets), take 1/2 tab per day, 15 tabs, 2 refills sent to pharmacy  Marena Chancy, PGY-3 Family Medicine Resident

## 2013-07-03 ENCOUNTER — Ambulatory Visit (INDEPENDENT_AMBULATORY_CARE_PROVIDER_SITE_OTHER): Payer: 59 | Admitting: Family Medicine

## 2013-07-03 ENCOUNTER — Encounter: Payer: Self-pay | Admitting: Family Medicine

## 2013-07-03 VITALS — BP 145/82 | HR 90 | Temp 98.0°F | Ht 66.0 in | Wt 178.0 lb

## 2013-07-03 DIAGNOSIS — E039 Hypothyroidism, unspecified: Secondary | ICD-10-CM

## 2013-07-03 DIAGNOSIS — E049 Nontoxic goiter, unspecified: Secondary | ICD-10-CM

## 2013-07-03 NOTE — Assessment & Plan Note (Addendum)
Patient has goiter based on ultrasound from April 2013. Patient is having increased neck fullness and has palpable enlarged thyroid gland on examination. -Patient to have ultrasound of thyroid gland and soft tissue of the neck to evaluate for increasing size of her goiter and/or development of thyroid nodule.

## 2013-07-03 NOTE — Assessment & Plan Note (Signed)
Patient has history of hypothyroidism that appears well controlled on current dose of levothyroxine based on TSH 3 weeks ago. History of goiter on ultrasound performed in April 2013. -Will continue current dose of Synthroid

## 2013-07-03 NOTE — Patient Instructions (Signed)
You will be called to schedule thyroid US in the next week.

## 2013-07-03 NOTE — Progress Notes (Signed)
  Subjective:    Patient ID: Gina Wise, female    DOB: Dec 16, 1960, 52 y.o.   MRN: [id number]  HPI 52 year old Caucasian female presents for followup of fullness in her neck, patient was recently seen and evaluated in the emergency room and had subsequent followup at [facility] family practice, she was diagnosed with bilateral cervical lymphadenopathy on CT scan of head and neck in late August, patient states that she has continued to have a sensation of fullness that is worse on the left side of her neck, she is having difficulty with swallowing and feels that objects get stuck in her throat, she has mild radiation of the pain to the left jaw and left ear, she has a history of goiter and hypothyroidism, she is currently on Synthroid and he denies missing any doses, denies recent fevers or chills, she does have hot flashes however she had a hysterectomy one year ago, denies other recent changes in skin or hair, no nausea, no vomiting, no changes in bowel habits, she also has a history of cervical degenerative disc disease however she states the symptoms are different from the pain she has previously had   Review of Systems  Constitutional: Negative for fever and chills.  HENT: Positive for neck pain. Negative for ear pain and neck stiffness.   Respiratory: Negative for cough.   Cardiovascular: Negative for chest pain and palpitations.  Gastrointestinal: Negative for nausea, vomiting, diarrhea and abdominal distention.       Objective:   Physical Exam Vitals: Reviewed General: Pleasant, Caucasian female, no acute distress HEENT: Normocephalic, atraumatic, pupils are equal round and reactive to light, extra amounts are intact, no scleral icterus, no conjunctival pallor, nasal septum is midline, no rhinorrhea, moist mucous membranes, uvula midline, no pharyngeal erythema or exudate noted, bilateral external ear canals are without erythema, bilateral TMs are clear without bulging, neck was supple, no  cervical adenopathy was appreciated, her thyroid is enlarged with a left being larger than the right, no discrete nodules are palpated X line cardiac: Regular rate and rhythm, S1 and S2 present, no murmurs, no heaves or thrills Respiratory: Clear to patient bilaterally, normal effort  Reviewed ultrasound soft tissue neck from April 2013 and a CT of the neck from August of 2014       Assessment & Plan:  We see problem specific assessment and plan.

## 2013-07-06 ENCOUNTER — Ambulatory Visit (HOSPITAL_COMMUNITY)
Admission: RE | Admit: 2013-07-06 | Discharge: 2013-07-06 | Disposition: A | Discharge status: Home / Self Care | Payer: 59 | Source: Ambulatory Visit | Attending: Family Medicine | Admitting: Family Medicine

## 2013-07-06 DIAGNOSIS — R1310 Dysphagia, unspecified: Secondary | ICD-10-CM | POA: Insufficient documentation

## 2013-07-06 DIAGNOSIS — E041 Nontoxic single thyroid nodule: Secondary | ICD-10-CM | POA: Insufficient documentation

## 2013-07-06 DIAGNOSIS — E039 Hypothyroidism, unspecified: Secondary | ICD-10-CM

## 2013-07-07 ENCOUNTER — Telehealth: Payer: Self-pay | Admitting: Family Medicine

## 2013-07-07 NOTE — Telephone Encounter (Signed)
Patient is calling inquiring about her results from the ultrasound from 9/15. Please call patient back with results.

## 2013-07-07 NOTE — Telephone Encounter (Signed)
Will fwd. To Dr.Fletke for review. .Gina Wise  

## 2013-07-08 ENCOUNTER — Telehealth: Payer: Self-pay | Admitting: Family Medicine

## 2013-07-08 DIAGNOSIS — E049 Nontoxic goiter, unspecified: Secondary | ICD-10-CM

## 2013-07-08 NOTE — Telephone Encounter (Signed)
Discussed recent thyroid US results with patient, no change from previous imaging studies, patient continues to have neck fullness and difficulty with swallowing medications/food at times. Will refer to endocrinology for further evaluation. Patient was agreeable with this plan.

## 2013-07-20 ENCOUNTER — Encounter: Payer: Self-pay | Admitting: Family Medicine

## 2013-07-20 ENCOUNTER — Telehealth: Payer: Self-pay | Admitting: Family Medicine

## 2013-07-20 NOTE — Telephone Encounter (Signed)
Will fwd. To Dr.Fletke for review. Lorenda Hatchet, Renato Battles

## 2013-07-20 NOTE — Telephone Encounter (Signed)
Gina Wise is inquiring about the FMLA she wanted to have completed and sent back.  She need this faxed back asap to her employer. She is refaxing for completion by Dr. Randolm Idol

## 2013-07-21 ENCOUNTER — Telehealth: Payer: Self-pay | Admitting: Family Medicine

## 2013-07-21 NOTE — Telephone Encounter (Signed)
Called patient and left message. I received FMLA paperwork to fill out, but I do not know what it is for. I have not seen her recently and asked her to call the clinic and leave message with details and specifics of what condition she is needing FMLA for.   Marena Chancy, PGY-3 Family Medicine Resident

## 2013-07-22 ENCOUNTER — Telehealth: Payer: Self-pay | Admitting: Family Medicine

## 2013-07-22 NOTE — Telephone Encounter (Signed)
Faxed FMLA paperwork to [employer] at 1 [phone]  Marena Chancy, PGY-3 Family Medicine Resident

## 2013-07-22 NOTE — Telephone Encounter (Signed)
Gina Wise faxed over FMLA forms for Dr. Randolm Idol to complete.  Dr. Gwenlyn Saran is primary, but Dr. Randolm Idol was the last provider to see patient. Requesting also to have forms faxed to "[facility] [employer] [facility]" @ [phone] upon completion.

## 2013-07-22 NOTE — Telephone Encounter (Signed)
See previous phone note from 07/21/13---Dr. Gwenlyn Saran has completed and faxed FMLA form.  Gaylene Brooks, RN

## 2013-07-22 NOTE — Telephone Encounter (Signed)
Patient called back.  In August 2014, went to ED with chest and neck pain.  Followed up here with Dr. Piedad Climes.  Was started on abx due to "swollen lymph nodes."  Returned for follow-up with Dr. Randolm Idol and was diagnosed with enlarged thyroid and "trouble swallowing."  Had U/S last year and showed nodule.  Had another U/S done 2 weeks ago and showed "slow-growing nodule."  Patient was referred to endocrinologist, but hasn't received appt yet.  Missed some time from work in August and was "written up."  Needs to have FMLA to "protect my job."  FMLA papers were due by 07/21/13.  Will need to contact [place] [place] Emilio Math) at [phone] and see if this date can be extended since Dr. Gwenlyn Saran hasn't completed forms yet.  Will then need to fax forms to [phone] upon completion.   Called [place] and spoke with Beryle Beams.  Will accept FMLA form no later than today per Emilio Math due to "policy deadline."  Dr. Gwenlyn Saran will complete form today.  Message routed to Dr. Gwenlyn Saran.  Gaylene Brooks, RN

## 2013-07-30 ENCOUNTER — Telehealth: Payer: Self-pay | Admitting: Family Medicine

## 2013-07-30 NOTE — Telephone Encounter (Signed)
Called pt. LMVM to call back.  [facility] endocrinology never responded back, so I sent the referral to [facility]. They will get in contact with the pt to schedule appt. Lorenda Hatchet, Renato Battles

## 2013-07-30 NOTE — Telephone Encounter (Signed)
Pt called and wanted to know when her appointment for endocrinology is? jw

## 2013-07-31 NOTE — Telephone Encounter (Signed)
Pt has appt with Dr.Kumar at [facility]. Lorenda Hatchet, Renato Battles

## 2013-08-06 ENCOUNTER — Ambulatory Visit: Payer: 59 | Admitting: Endocrinology

## 2013-08-19 ENCOUNTER — Ambulatory Visit (INDEPENDENT_AMBULATORY_CARE_PROVIDER_SITE_OTHER): Payer: 59 | Admitting: Endocrinology

## 2013-08-19 ENCOUNTER — Other Ambulatory Visit: Payer: Self-pay | Admitting: Endocrinology

## 2013-08-19 ENCOUNTER — Encounter: Payer: Self-pay | Admitting: Endocrinology

## 2013-08-19 VITALS — BP 142/88 | HR 76 | Temp 98.7°F | Resp 12 | Ht 66.0 in | Wt 182.9 lb

## 2013-08-19 DIAGNOSIS — E039 Hypothyroidism, unspecified: Secondary | ICD-10-CM

## 2013-08-19 DIAGNOSIS — E049 Nontoxic goiter, unspecified: Secondary | ICD-10-CM

## 2013-08-19 DIAGNOSIS — R1310 Dysphagia, unspecified: Secondary | ICD-10-CM

## 2013-08-19 NOTE — Progress Notes (Signed)
Reason for Appointment:  Hypothyroidism, new visit    History of Present Illness:   Her hypothyroidism  was first diagnosed about 5 years ago At the time of diagnosis she was having problems with fatigue, cold sensitivity, dry skin ,difficulty with weight loss and some hair loss Her baseline TSH was 15. She was started on levothyroxine and this was adjusted in until her levels were back to normal She thinks she has been taking the same dose for a couple of years now and her last TSH was normal Currently does not have any unusual fatigue but tends to have some hair loss and dryness of the skin and splitting of the nails at times   Problem 2: She is complaining of difficulty swallowing for the last year and a half. She thinks that this is occurring with food and drinks and also has difficulty swallowing medications. Also she tends to get some hoarseness of her voice. She tends to have a pressure sensation in her neck especially on the left side She is also having difficulties with needing to clear her throat frequently as well as a dry cough          She has been evaluated with ultrasounds of the thyroid which have not showed any significant thyroid enlargement and only a subcentimeter stable thyroid nodule. CT scan of her neck has shown lymphadenopathy     No visits with results within 1 Week(s) from this visit. Latest known visit with results is:  Admission on 06/11/2013, Discharged on 06/11/2013  Component Date Value Range Status  . WBC 06/11/2013 6.7  4.0 - 10.5 K/uL Final  . RBC 06/11/2013 4.36  3.87 - 5.11 MIL/uL Final  . Hemoglobin 06/11/2013 13.3  12.0 - 15.0 g/dL Final  . HCT 69/62/9528 38.4  36.0 - 46.0 % Final  . MCV 06/11/2013 88.1  78.0 - 100.0 fL Final  . MCH 06/11/2013 30.5  26.0 - 34.0 pg Final  . MCHC 06/11/2013 34.6  30.0 - 36.0 g/dL Final  . RDW 41/32/4401 13.0  11.5 - 15.5 % Final  . Platelets 06/11/2013 245  150 - 400 K/uL Final  . Neutrophils Relative %  06/11/2013 55  43 - 77 % Final  . Neutro Abs 06/11/2013 3.7  1.7 - 7.7 K/uL Final  . Lymphocytes Relative 06/11/2013 33  12 - 46 % Final  . Lymphs Abs 06/11/2013 2.2  0.7 - 4.0 K/uL Final  . Monocytes Relative 06/11/2013 9  3 - 12 % Final  . Monocytes Absolute 06/11/2013 0.6  0.1 - 1.0 K/uL Final  . Eosinophils Relative 06/11/2013 3  0 - 5 % Final  . Eosinophils Absolute 06/11/2013 0.2  0.0 - 0.7 K/uL Final  . Basophils Relative 06/11/2013 0  0 - 1 % Final  . Basophils Absolute 06/11/2013 0.0  0.0 - 0.1 K/uL Final  . Sodium 06/11/2013 141  135 - 145 mEq/L Final  . Potassium 06/11/2013 3.6  3.5 - 5.1 mEq/L Final  . Chloride 06/11/2013 104  96 - 112 mEq/L Final  . CO2 06/11/2013 25  19 - 32 mEq/L Final  . Glucose, Bld 06/11/2013 96  70 - 99 mg/dL Final  . BUN 02/72/5366 10  6 - 23 mg/dL Final  . Creatinine, Ser 06/11/2013 0.70  0.50 - 1.10 mg/dL Final  . Calcium 44/12/4740 9.6  8.4 - 10.5 mg/dL Final  . Total Protein 06/11/2013 7.3  6.0 - 8.3 g/dL Final  . Albumin 59/56/3875 3.9  3.5 - 5.2  g/dL Final  . AST 16/07/9603 19  0 - 37 U/L Final  . ALT 06/11/2013 15  0 - 35 U/L Final  . Alkaline Phosphatase 06/11/2013 58  39 - 117 U/L Final  . Total Bilirubin 06/11/2013 0.3  0.3 - 1.2 mg/dL Final  . GFR calc non Af Amer 06/11/2013 >90  >90 mL/min Final  . GFR calc Af Amer 06/11/2013 >90  >90 mL/min Final   Comment: (NOTE)                          The eGFR has been calculated using the CKD EPI equation.                          This calculation has not been validated in all clinical situations.                          eGFR's persistently <90 mL/min signify possible Chronic Kidney                          Disease.  . Lipase 06/11/2013 68* 11 - 59 U/L Final  . Troponin I 06/11/2013 <0.30  <0.30 ng/mL Final   Comment:                                 Due to the release kinetics of cTnI,                          a negative result within the first hours                          of the onset of  symptoms does not rule out                          myocardial infarction with certainty.                          If myocardial infarction is still suspected,                          repeat the test at appropriate intervals.  Marland Kitchen TSH 06/11/2013 1.519  0.350 - 4.500 uIU/mL Final   Performed at [facility]  . Free T4 06/11/2013 1.58  0.80 - 1.80 ng/dL Final   Performed at [facility]  . Troponin I 06/11/2013 <0.30  <0.30 ng/mL Final   Comment:                                 Due to the release kinetics of cTnI,                          a negative result within the first hours                          of the onset of symptoms does not rule out  myocardial infarction with certainty.                          If myocardial infarction is still suspected,                          repeat the test at appropriate intervals.    Past Medical History  Diagnosis Date  . Hypothyroid   . Depression   . Overweight(278.02)   . Migraines   . Hypertension   . Peri-menopause   . Anemia     Past Surgical History  Procedure Laterality Date  . Tonsillectomy    . Rotator cuff repair      left  . Shoulder surgery  [id number]  . Exploratory laparotomy  1987  . Inguinal hernia repair    . Appendectomy  1987  . Tubal ligation  1998    laparoscopic  . Vaginal hysterectomy  04/08/2012    menorrhagia    Family History  Problem Relation Age of Onset  . Hypertension Father   . Emphysema Father   . Vision loss Father   . Alcohol abuse Father   . Hypertension Mother   . Heart disease Mother   . Hepatitis Mother     Hep C  . Endometriosis Mother   . Lupus Sister   . Endometriosis Sister   . Breast cancer Maternal Aunt     30's  . Breast cancer Paternal Aunt     63's  . Cancer Paternal Uncle     lung smoker  . Cancer Maternal Grandfather     colon  . Cancer Maternal Aunt     uterine-50's  . Cancer Paternal Aunt     cervical-40's  . Cancer Paternal  Uncle     lung cancer    Social History:  reports that she has never smoked. She does not have any smokeless tobacco history on file. She reports that she drinks alcohol. She reports that she does not use illicit drugs.  Allergies:  Allergies  Allergen Reactions  . Codeine Nausea Only  . Erythromycin Nausea And Vomiting    REACTION: Nausea Severe abdominal pain      Medication List       This list is accurate as of: 08/19/13  2:36 PM.  Always use your most recent med list.               acetaminophen 500 MG tablet  Commonly known as:  TYLENOL  Take 500 mg by mouth every 6 (six) hours as needed.     aspirin 325 MG tablet  Take 325 mg by mouth daily as needed. For pain     atorvastatin 20 MG tablet  Commonly known as:  LIPITOR  Take 1 tablet (20 mg total) by mouth daily.     busPIRone 7.5 MG tablet  Commonly known as:  BUSPAR  Take 1 tablet (7.5 mg total) by mouth 2 (two) times daily.     fluticasone 50 MCG/ACT nasal spray  Commonly known as:  FLONASE  Place 2 sprays into the nose daily.     hydrochlorothiazide 25 MG tablet  Commonly known as:  HYDRODIURIL  Take 0.5 tablets (12.5 mg total) by mouth daily.     ibuprofen 600 MG tablet  Commonly known as:  ADVIL,MOTRIN  Take 1 tablet (600 mg total) by mouth every 8 (eight) hours as needed for pain.     ibuprofen  800 MG tablet  Commonly known as:  ADVIL,MOTRIN  Take 1 tablet (800 mg total) by mouth 3 (three) times daily.     levothyroxine 88 MCG tablet  Commonly known as:  SYNTHROID, LEVOTHROID  Take 1 tablet (88 mcg total) by mouth daily before breakfast.     ondansetron 4 MG tablet  Commonly known as:  ZOFRAN  Take 1 tablet (4 mg total) by mouth every 6 (six) hours.     sertraline 100 MG tablet  Commonly known as:  ZOLOFT  Take 1 tablet (100 mg total) by mouth daily.     VITAMIN B 12 PO  Take by mouth.     WOMENS MULTIVITAMIN PLUS Tabs  Take 1 tablet by mouth daily.        Review of  Systems:  She complains of difficulty losing weight She has had a dry cough without any sputum or wheezing CARDIOLOGY: no history of high blood pressure.            GASTROENTEROLOGY:  no Change in bowel habits.      ENDOCRINOLOGY:  no history of Diabetes.    Has history of hypercholesterolemia  History of depression with anxiety, currently taking Zoloft           Examination:    BP 142/88  Pulse 76  Temp(Src) 98.7 F (37.1 C)  Resp 12  Ht 5\' 6"  (1.676 m)  Wt 182 lb 14.4 oz (82.963 kg)  BMI 29.53 kg/m2  SpO2 96%  LMP 04/03/2012   General Appearance: pleasant, in no acute distress          Eyes: No prominence of the eyes or eyelid swelling .          Neck: The thyroid is enlarged about 1-1/2 times normal on the left side, relatively soft, slightly irregular and minimally tender tender. No enlargement of the right side and no palpable nodules. Pemberton sign negative. She has a sense of pressure on the left side when raising her left arm up. No stridor There is no lymphadenopathy .    Cardiovascular: Normal heart sounds, no murmur Respiratory:  Lungs clear Neurological: REFLEXES at biceps are brisk.     Skin: moist, warm, no rash        Assessments   1. Hypothyroidism: She appears Hashimoto's thyroiditis with primary hypothyroidism. Her dosage of 88 mcg is adequate since her last TSH was normal  2. Nonspecific symptoms of dysphagia, chronic dry cough, hoarseness, pressure sensation in left side of throat. She does not have a significant goiter to cause the pressure symptoms above Currently her main symptom is difficulty swallowing Her ultrasound shows the thyroid lobes to be measuring 4.9 cm which is near normal She may have an inflammatory lesion in the left neck area although doubt if this is related to the thyroid. Does have evidence of adenopathy  RECOMMENDATIONS:  Check ESR to check for any unusual inflammatory condition Would suggest barium swallow to check her  swallowing function ENT consultation to evaluate for dry cough and hoarseness, she may have a postnasal drainage or reflux Continue same dose of thyroid supplement and followup with PCP for her hypothyroidism   Jestina Stephani 08/19/2013, 2:36 PM   Addendum: ESR normal at 1

## 2013-08-20 ENCOUNTER — Telehealth: Payer: Self-pay

## 2013-08-20 DIAGNOSIS — R490 Dysphonia: Secondary | ICD-10-CM

## 2013-08-20 NOTE — Telephone Encounter (Signed)
Will fwd to [facility], referral specialist and patient's PCP.   Gina Wise, Darlyne Russian, CMA

## 2013-08-20 NOTE — Telephone Encounter (Signed)
Referral for ENT placed.   Marena Chancy, PGY-3 Family Medicine Resident

## 2013-08-20 NOTE — Telephone Encounter (Signed)
Dr. Randolm Idol referred patient to Dr. Lucianne Muss. Dr. Lucianne Muss would like patient to see an ENT. She will need this referral from our office. Patient has not preference on ENT doctor. Please call patient once completed.

## 2013-08-25 ENCOUNTER — Ambulatory Visit (INDEPENDENT_AMBULATORY_CARE_PROVIDER_SITE_OTHER): Payer: 59 | Admitting: Family Medicine

## 2013-08-25 ENCOUNTER — Encounter: Payer: Self-pay | Admitting: Family Medicine

## 2013-08-25 VITALS — BP 136/86 | HR 80 | Temp 97.8°F | Ht 66.0 in | Wt 182.0 lb

## 2013-08-25 DIAGNOSIS — G43909 Migraine, unspecified, not intractable, without status migrainosus: Secondary | ICD-10-CM

## 2013-08-25 MED ORDER — KETOROLAC TROMETHAMINE 60 MG/2ML IM SOLN
60.0000 mg | Freq: Once | INTRAMUSCULAR | Status: AC
  Administered 2013-08-25: 60 mg via INTRAMUSCULAR

## 2013-08-25 MED ORDER — KETOROLAC TROMETHAMINE 60 MG/2ML IM SOLN: 60.0000 mg | Freq: Once | INTRAMUSCULAR | Status: DC

## 2013-08-25 MED ORDER — SUMATRIPTAN SUCCINATE 100 MG PO TABS: 100.0000 mg | ORAL_TABLET | Freq: Once | ORAL | Status: DC | PRN

## 2013-08-25 MED ORDER — DEXAMETHASONE SODIUM PHOSPHATE 10 MG/ML IJ SOLN
10.0000 mg | Freq: Once | INTRAMUSCULAR | Status: AC
  Administered 2013-08-25: 10 mg via INTRAMUSCULAR

## 2013-08-25 NOTE — Progress Notes (Signed)
Patient ID: Gina Wise, female   DOB: 1961/02/19, 52 y.o.   MRN: [id number]  S: 52 y.o. F presents with severe headache that is similar to her prior migraines. Behind left eye, photophobia, phonophobia, and sensitive to smell. Pt has tried aspirin and dramamine. Pt has had response to triptans in past. +N/V No fevers, +chills, Pt endorses similar headaches in the past.  O:  Filed Vitals:   08/25/13 1012  BP: 136/86  Pulse: 80  Temp: 97.8 F (36.6 C)   Photophobia Phonophobia improved with dark room. VSS, NAD No nuchal rigidity EOMI, normal pupils No focal neurological deficits  A/P 52 y.o. F with migraine that is classic for her without resolution with usual treatment. No evidence of infection per hx or report or neurologic compromise. Will treat with IM decadron and toradol. Pt has phenergan at home and is going home to take and sleep. Pt givne rx for sumatriptan and states understanding about abortive therapy. Pt does not meet criteria with migraines 1-2x/year for suppresive therapy at this time. Pt agrees.  Tawana Scale, MD OB Fellow

## 2013-08-25 NOTE — Telephone Encounter (Signed)
Pt has an appt on 11/07@3 :10 at [facility] [phone] I called pt and lvm that have to call us back to get appt inf  [facility]

## 2013-08-25 NOTE — Addendum Note (Signed)
Addended by: Radene Ou on: 08/25/2013 11:57 AM   Modules accepted: Orders

## 2013-08-26 ENCOUNTER — Telehealth: Payer: Self-pay | Admitting: Family Medicine

## 2013-08-26 MED ORDER — ONDANSETRON HCL 4 MG PO TABS: 4.0000 mg | ORAL_TABLET | Freq: Four times a day (QID) | ORAL | Status: DC

## 2013-08-26 NOTE — Telephone Encounter (Signed)
Pt called because she was seen yesterday and told the doctor that she still had nausea medication at home. When she got home she realized she only had 4 pills. She would like some more called in to the pharmacy. JW

## 2013-08-26 NOTE — Telephone Encounter (Signed)
Will fwd to MD.  Lynasia Meloche L, CMA  

## 2013-08-26 NOTE — Telephone Encounter (Signed)
Refilled zofran 4mg  20 tabs no refill  Marena Chancy, PGY-3 Family Medicine Resident

## 2013-08-27 ENCOUNTER — Other Ambulatory Visit: Payer: Self-pay

## 2013-08-27 NOTE — Telephone Encounter (Signed)
LVM for patient to call back. ?

## 2013-09-12 ENCOUNTER — Other Ambulatory Visit: Payer: Self-pay | Admitting: Emergency Medicine

## 2013-11-09 ENCOUNTER — Telehealth: Payer: Self-pay | Admitting: *Deleted

## 2013-11-09 MED ORDER — LEVOTHYROXINE SODIUM 88 MCG PO TABS: 88.0000 ug | ORAL_TABLET | Freq: Every day | ORAL | Status: DC

## 2013-11-09 NOTE — Telephone Encounter (Signed)
Patient calling requesting refill for Synthroid.

## 2013-11-09 NOTE — Telephone Encounter (Signed)
Refilling synthroid 9mcg daily. Reviewed last TSH and normal on 06/11/2013. Patient will need to be seen in the office for yearly TSH check in August 2015.  Please let patient know. Thank you!  Liam Graham, PGY-3 Family Medicine Resident

## 2013-11-10 NOTE — Telephone Encounter (Signed)
Called pt.LMVM to call back. Please see message. Thanks. .Ashtan Laton  

## 2013-12-28 ENCOUNTER — Other Ambulatory Visit: Payer: Self-pay | Admitting: *Deleted

## 2013-12-28 MED ORDER — HYDROCHLOROTHIAZIDE 25 MG PO TABS: 12.5000 mg | ORAL_TABLET | Freq: Every day | ORAL | Status: DC

## 2014-01-29 ENCOUNTER — Emergency Department (HOSPITAL_BASED_OUTPATIENT_CLINIC_OR_DEPARTMENT_OTHER)
Admission: EM | Admit: 2014-01-29 | Discharge: 2014-01-29 | Disposition: A | Discharge status: Home / Self Care | Payer: 59 | Attending: Emergency Medicine | Admitting: Emergency Medicine

## 2014-01-29 ENCOUNTER — Encounter (HOSPITAL_BASED_OUTPATIENT_CLINIC_OR_DEPARTMENT_OTHER): Payer: Self-pay | Admitting: Emergency Medicine

## 2014-01-29 DIAGNOSIS — I10 Essential (primary) hypertension: Secondary | ICD-10-CM | POA: Insufficient documentation

## 2014-01-29 DIAGNOSIS — Z8742 Personal history of other diseases of the female genital tract: Secondary | ICD-10-CM | POA: Insufficient documentation

## 2014-01-29 DIAGNOSIS — D649 Anemia, unspecified: Secondary | ICD-10-CM | POA: Insufficient documentation

## 2014-01-29 DIAGNOSIS — R05 Cough: Secondary | ICD-10-CM

## 2014-01-29 DIAGNOSIS — E039 Hypothyroidism, unspecified: Secondary | ICD-10-CM | POA: Insufficient documentation

## 2014-01-29 DIAGNOSIS — G43909 Migraine, unspecified, not intractable, without status migrainosus: Secondary | ICD-10-CM | POA: Insufficient documentation

## 2014-01-29 DIAGNOSIS — R059 Cough, unspecified: Secondary | ICD-10-CM

## 2014-01-29 DIAGNOSIS — Z79899 Other long term (current) drug therapy: Secondary | ICD-10-CM | POA: Insufficient documentation

## 2014-01-29 DIAGNOSIS — F3289 Other specified depressive episodes: Secondary | ICD-10-CM | POA: Insufficient documentation

## 2014-01-29 DIAGNOSIS — F329 Major depressive disorder, single episode, unspecified: Secondary | ICD-10-CM | POA: Insufficient documentation

## 2014-01-29 DIAGNOSIS — E663 Overweight: Secondary | ICD-10-CM | POA: Insufficient documentation

## 2014-01-29 DIAGNOSIS — Z791 Long term (current) use of non-steroidal anti-inflammatories (NSAID): Secondary | ICD-10-CM | POA: Insufficient documentation

## 2014-01-29 DIAGNOSIS — Z7982 Long term (current) use of aspirin: Secondary | ICD-10-CM | POA: Insufficient documentation

## 2014-01-29 DIAGNOSIS — J309 Allergic rhinitis, unspecified: Secondary | ICD-10-CM | POA: Insufficient documentation

## 2014-01-29 DIAGNOSIS — IMO0002 Reserved for concepts with insufficient information to code with codable children: Secondary | ICD-10-CM | POA: Insufficient documentation

## 2014-01-29 DIAGNOSIS — J302 Other seasonal allergic rhinitis: Secondary | ICD-10-CM

## 2014-01-29 MED ORDER — HYDROCODONE-HOMATROPINE 5-1.5 MG/5ML PO SYRP: 5.0000 mL | ORAL_SOLUTION | Freq: Four times a day (QID) | ORAL | Status: DC | PRN

## 2014-01-29 MED ORDER — FLUTICASONE PROPIONATE 50 MCG/ACT NA SUSP
2.0000 | Freq: Every day | NASAL | Status: DC
Start: 2014-01-29 — End: 2014-02-02

## 2014-01-29 MED ORDER — BENZONATATE 100 MG PO CAPS: 100.0000 mg | ORAL_CAPSULE | Freq: Three times a day (TID) | ORAL | Status: DC

## 2014-01-29 NOTE — ED Notes (Signed)
Pt states she gets this same cough every spring, cough onset 2 weeks ago this year, states she takes zyrtec, delsym, and mucinex with some relief, pt states she has had an increase in the cough since last night.

## 2014-01-29 NOTE — ED Notes (Signed)
Cough x 2 weeks. Head and nasal congestion. States she gets this every spring. No relief with OTC allergy meds.

## 2014-01-29 NOTE — Discharge Instructions (Signed)
Allergic Rhinitis Allergic rhinitis is when the mucous membranes in the nose respond to allergens. Allergens are particles in the air that cause your body to have an allergic reaction. This causes you to release allergic antibodies. Through a chain of events, these eventually cause you to release histamine into the blood stream. Although meant to protect the body, it is this release of histamine that causes your discomfort, such as frequent sneezing, congestion, and an itchy, runny nose.  CAUSES  Seasonal allergic rhinitis (hay fever) is caused by pollen allergens that may come from grasses, trees, and weeds. Year-round allergic rhinitis (perennial allergic rhinitis) is caused by allergens such as house dust mites, pet dander, and mold spores.  SYMPTOMS   Nasal stuffiness (congestion).  Itchy, runny nose with sneezing and tearing of the eyes. DIAGNOSIS  Your health care provider can help you determine the allergen or allergens that trigger your symptoms. If you and your health care provider are unable to determine the allergen, skin or blood testing may be used. TREATMENT  Allergic Rhinitis does not have a cure, but it can be controlled by:  Medicines and allergy shots (immunotherapy).  Avoiding the allergen. Hay fever may often be treated with antihistamines in pill or nasal spray forms. Antihistamines block the effects of histamine. There are over-the-counter medicines that may help with nasal congestion and swelling around the eyes. Check with your health care provider before taking or giving this medicine.  If avoiding the allergen or the medicine prescribed do not work, there are many new medicines your health care provider can prescribe. Stronger medicine may be used if initial measures are ineffective. Desensitizing injections can be used if medicine and avoidance does not work. Desensitization is when a patient is given ongoing shots until the body becomes less sensitive to the allergen.  Make sure you follow up with your health care provider if problems continue. HOME CARE INSTRUCTIONS It is not possible to completely avoid allergens, but you can reduce your symptoms by taking steps to limit your exposure to them. It helps to know exactly what you are allergic to so that you can avoid your specific triggers. SEEK MEDICAL CARE IF:   You have a fever.  You develop a cough that does not stop easily (persistent).  You have shortness of breath.  You start wheezing.  Symptoms interfere with normal daily activities. Document Released: 07/03/2001 Document Revised: 07/29/2013 Document Reviewed: 06/15/2013 [employer] Patient Information 2014 [employer].  

## 2014-01-29 NOTE — ED Provider Notes (Signed)
CSN: [id number]     Arrival date & time 01/29/14  1342 History   First MD Initiated Contact with Patient 01/29/14 1503     Chief Complaint  Patient presents with  . Cough    Patient is a 53 y.o. female presenting with cough. The history is provided by the patient.  Cough Cough characteristics:  Dry Duration:  2 weeks Timing:  Constant Chronicity:  Recurrent (Pt seems to get this type of cough every year this time of year.) Relieved by:  Nothing Ineffective treatments: she tried delsym and mucinex. Associated symptoms: rhinorrhea and sinus congestion   Associated symptoms: no chest pain, no fever and no sore throat   Risk factors: no chemical exposure and no recent travel    the patient was unable to make an appointment with her Dr. this week. The first time she was going to be able to be seen was on Monday. Patient is coughing so much that she can't get any rest. He has not felt short of breath. She has not had any chest pain. Last year in March she had a very similar event when the pollen was very heavy.  She had a prescription for Hycodan and Tessalon. Patient states that helped  Past Medical History  Diagnosis Date  . Hypothyroid   . Depression   . Overweight   . Migraines   . Hypertension   . Peri-menopause   . Anemia    Past Surgical History  Procedure Laterality Date  . Tonsillectomy    . Rotator cuff repair      left  . Shoulder surgery  [id number]  . Exploratory laparotomy  1987  . Inguinal hernia repair    . Appendectomy  1987  . Tubal ligation  1998    laparoscopic  . Vaginal hysterectomy  04/08/2012    menorrhagia   Family History  Problem Relation Age of Onset  . Hypertension Father   . Emphysema Father   . Vision loss Father   . Alcohol abuse Father   . Hypertension Mother   . Heart disease Mother   . Hepatitis Mother     Hep C  . Endometriosis Mother   . Lupus Sister   . Endometriosis Sister   . Breast cancer Maternal Aunt     30's  . Breast  cancer Paternal Aunt     39's  . Cancer Paternal Uncle     lung smoker  . Cancer Maternal Grandfather     colon  . Cancer Maternal Aunt     uterine-50's  . Cancer Paternal Aunt     cervical-40's  . Cancer Paternal Uncle     lung cancer   History  Substance Use Topics  . Smoking status: Never Smoker   . Smokeless tobacco: Not on file  . Alcohol Use: Yes   OB History   Grav Para Term Preterm Abortions TAB SAB Ect Mult Living   2 2 2       2      Review of Systems  Constitutional: Negative for fever.  HENT: Positive for rhinorrhea. Negative for sore throat.   Respiratory: Positive for cough.   Cardiovascular: Negative for chest pain.  All other systems reviewed and are negative.     Allergies  Codeine and Erythromycin  Home Medications   Current Outpatient Rx  Name  Route  Sig  Dispense  Refill  . OMEPRAZOLE PO   Oral   Take by mouth.         Marland Kitchen  acetaminophen (TYLENOL) 500 MG tablet   Oral   Take 500 mg by mouth every 6 (six) hours as needed.         Marland Kitchen aspirin 325 MG tablet   Oral   Take 325 mg by mouth daily as needed. For pain         . atorvastatin (LIPITOR) 20 MG tablet   Oral   Take 1 tablet (20 mg total) by mouth daily.   90 tablet   3   . busPIRone (BUSPAR) 7.5 MG tablet      TAKE ONE TABLET BY MOUTH TWICE DAILY   60 tablet   0   . Cyanocobalamin (VITAMIN B 12 PO)   Oral   Take by mouth.         . fluticasone (FLONASE) 50 MCG/ACT nasal spray   Nasal   Place 2 sprays into the nose daily.   16 g   0   . hydrochlorothiazide (HYDRODIURIL) 25 MG tablet   Oral   Take 0.5 tablets (12.5 mg total) by mouth daily.   15 tablet   3   . ibuprofen (ADVIL,MOTRIN) 600 MG tablet   Oral   Take 1 tablet (600 mg total) by mouth every 8 (eight) hours as needed for pain.   20 tablet   0   . ibuprofen (ADVIL,MOTRIN) 800 MG tablet   Oral   Take 1 tablet (800 mg total) by mouth 3 (three) times daily.   21 tablet   0   . levothyroxine  (SYNTHROID, LEVOTHROID) 88 MCG tablet   Oral   Take 1 tablet (88 mcg total) by mouth daily before breakfast.   30 tablet   6   . Multiple Vitamins-Minerals (WOMENS MULTIVITAMIN PLUS) TABS   Oral   Take 1 tablet by mouth daily.           . ondansetron (ZOFRAN) 4 MG tablet   Oral   Take 1 tablet (4 mg total) by mouth every 6 (six) hours.   20 tablet   0   . sertraline (ZOLOFT) 100 MG tablet   Oral   Take 1 tablet (100 mg total) by mouth daily.   30 tablet   10   . SUMAtriptan (IMITREX) 100 MG tablet   Oral   Take 1 tablet (100 mg total) by mouth once as needed for migraine. May repeat in 2 hours if headache persists or recurs.   30 tablet   2    BP 129/86  Pulse 67  Temp(Src) 98.3 F (36.8 C) (Oral)  Resp 20  Ht 5\' 6"  (1.676 m)  Wt 182 lb (82.555 kg)  BMI 29.39 kg/m2  SpO2 100%  LMP 04/03/2012 Physical Exam  Nursing note and vitals reviewed. Constitutional: She appears well-developed and well-nourished. No distress.  HENT:  Head: Normocephalic and atraumatic.  Right Ear: External ear normal.  Left Ear: External ear normal.  Eyes: Conjunctivae are normal. Right eye exhibits no discharge. Left eye exhibits no discharge. No scleral icterus.  Neck: Neck supple. No tracheal deviation present.  Cardiovascular: Normal rate, regular rhythm and intact distal pulses.   Pulmonary/Chest: Effort normal and breath sounds normal. No stridor. No respiratory distress. She has no wheezes. She has no rales.  Constantly coughing  Abdominal: Soft. Bowel sounds are normal. She exhibits no distension. There is no tenderness. There is no rebound and no guarding.  Musculoskeletal: She exhibits no edema and no tenderness.  Neurological: She is alert. She has normal strength. No  cranial nerve deficit (no facial droop, extraocular movements intact, no slurred speech) or sensory deficit. She exhibits normal muscle tone. She displays no seizure activity. Coordination normal.  Skin: Skin is  warm and dry. No rash noted.  Psychiatric: She has a normal mood and affect.    ED Course  Procedures (including critical care time) MDM   Final diagnoses:  Seasonal allergies  Cough   patient's symptoms are suggestive of an allergic type trigger. I will give her prescription for Flonase as well as Hycodan and Tessalon. she is to follow up with her primary Dr. if not improving.      Kathalene Frames, MD 01/29/14 [phone]

## 2014-02-02 ENCOUNTER — Encounter: Payer: Self-pay | Admitting: Family Medicine

## 2014-02-02 ENCOUNTER — Ambulatory Visit (INDEPENDENT_AMBULATORY_CARE_PROVIDER_SITE_OTHER): Payer: 59 | Admitting: Family Medicine

## 2014-02-02 VITALS — BP 137/89 | HR 106 | Temp 98.1°F | Wt 187.0 lb

## 2014-02-02 DIAGNOSIS — R05 Cough: Secondary | ICD-10-CM

## 2014-02-02 DIAGNOSIS — R059 Cough, unspecified: Secondary | ICD-10-CM

## 2014-02-02 MED ORDER — AZITHROMYCIN 250 MG PO TABS: ORAL_TABLET | ORAL | Status: DC

## 2014-02-02 NOTE — Patient Instructions (Addendum)
I am sorry you do not feel well today. Your lungs sounds clear to me, this could be related to your allergy,but due to hx of bronchitis, I will start you on antibiotic, continue hycodan, zyrtec, tessalon prn. I will like for you to get PFT done. F/U soon with your PCP within 1 wk and if symptom worsens as discussed with you please go to the ED.  Schedule PFT with Dr Jearld Shines you need referral to allergist.

## 2014-02-02 NOTE — Assessment & Plan Note (Signed)
Chronic and recurrent. Allergy related vs URI. Already on Hycodan,tessalon prn and Zyrtec. Oral steroid recommended but she declined stating it makes her bleed in her gum. Due to hx of cough worsening and then ended up in the hospital for pneumonia I recommended chest xray but she prefers A/B treatment instead. Zithromax prescribed for 5 days, she had tried this in the past which helped with no S/E to the medication.  I recommended PFT with Dr Maryjean Ka since this has been recurrent, to r/o other causes such as obstructive lung disease. She will schedule appointment. I will also refer her to follow up with allergist.

## 2014-02-02 NOTE — Progress Notes (Signed)
Subjective:     Patient ID: Gina Wise, female   DOB: September 12, 1961, 53 y.o.   MRN: [id number]  Cough This is a recurrent (Coughing for more than 3 wks,similar cough 1 hr ago, ususally occurs during the spring time, this is the worse ) problem. Episode onset: Worsening over the last few hours. She went to the ED 5 days ago, was sent home on Hycodan,tessolon and flonase which helped for a while and then worsened since yesterday. The problem has been gradually worsening. The problem occurs constantly. The cough is productive of sputum (Sputum production sometimes, yellowish in color,no blood). Associated symptoms include chest pain, chills, ear congestion, nasal congestion, shortness of breath and wheezing. Pertinent negatives include no fever. Associated symptoms comments: Temp not checked but has not had high temp.. The symptoms are aggravated by pollens, dust, animals and exercise (movement,cats). Risk factors: No cats at home home, no smoke exposure,works at billing department at [employer]. She has tried a beta-agonist inhaler and prescription cough suppressant (last used albuterol yesterday) for the symptoms. The treatment provided no relief. Her past medical history is significant for bronchitis and environmental allergies.   Current Outpatient Prescriptions on File Prior to Visit  Medication Sig Dispense Refill  . benzonatate (TESSALON) 100 MG capsule Take 1 capsule (100 mg total) by mouth every 8 (eight) hours.  21 capsule  0  . fluticasone (FLONASE) 50 MCG/ACT nasal spray Place 2 sprays into the nose daily.  16 g  0  . hydrochlorothiazide (HYDRODIURIL) 25 MG tablet Take 0.5 tablets (12.5 mg total) by mouth daily.  15 tablet  3  . HYDROcodone-homatropine (HYCODAN) 5-1.5 MG/5ML syrup Take 5 mLs by mouth every 6 (six) hours as needed for cough.  120 mL  0  . levothyroxine (SYNTHROID, LEVOTHROID) 88 MCG tablet Take 1 tablet (88 mcg total) by mouth daily before breakfast.  30 tablet  6  . Multiple  Vitamins-Minerals (WOMENS MULTIVITAMIN PLUS) TABS Take 1 tablet by mouth daily.        Marland Kitchen OMEPRAZOLE PO Take by mouth.      Marland Kitchen atorvastatin (LIPITOR) 20 MG tablet Take 1 tablet (20 mg total) by mouth daily.  90 tablet  3  . busPIRone (BUSPAR) 7.5 MG tablet TAKE ONE TABLET BY MOUTH TWICE DAILY  60 tablet  0  . SUMAtriptan (IMITREX) 100 MG tablet Take 1 tablet (100 mg total) by mouth once as needed for migraine. May repeat in 2 hours if headache persists or recurs.  30 tablet  2   No current facility-administered medications on file prior to visit.   Past Medical History  Diagnosis Date  . Hypothyroid   . Depression   . Overweight   . Migraines   . Hypertension   . Peri-menopause   . Anemia      Review of Systems  Unable to perform ROS Constitutional: Positive for chills. Negative for fever.  Respiratory: Positive for cough, shortness of breath and wheezing.   Cardiovascular: Positive for chest pain.  Allergic/Immunologic: Positive for environmental allergies.  All other systems reviewed and are negative.  Filed Vitals:   02/02/14 1112  BP: 137/89  Pulse: 106  Temp: 98.1 F (36.7 C)  TempSrc: Oral  Weight: 187 lb (84.823 kg)  SpO2: 97%       Objective:   Physical Exam  Nursing note and vitals reviewed. Constitutional: She appears well-developed. No distress.  Cardiovascular: Normal rate, regular rhythm, normal heart sounds and intact distal pulses.   No murmur heard.  Pulmonary/Chest: Effort normal and breath sounds normal. No respiratory distress. She has no decreased breath sounds. She has no wheezes. She has no rhonchi. She has no rales.  Abdominal: Bowel sounds are normal. She exhibits no distension and no mass.       Assessment:     Cough: Chronic recurrent     Plan:     Check problem list.

## 2014-02-05 ENCOUNTER — Emergency Department (HOSPITAL_BASED_OUTPATIENT_CLINIC_OR_DEPARTMENT_OTHER): Payer: 59

## 2014-02-05 ENCOUNTER — Other Ambulatory Visit: Payer: Self-pay

## 2014-02-05 ENCOUNTER — Encounter (HOSPITAL_BASED_OUTPATIENT_CLINIC_OR_DEPARTMENT_OTHER): Payer: Self-pay | Admitting: Emergency Medicine

## 2014-02-05 ENCOUNTER — Emergency Department (HOSPITAL_BASED_OUTPATIENT_CLINIC_OR_DEPARTMENT_OTHER)
Admission: EM | Admit: 2014-02-05 | Discharge: 2014-02-05 | Disposition: A | Discharge status: Home / Self Care | Payer: 59 | Attending: Emergency Medicine | Admitting: Emergency Medicine

## 2014-02-05 DIAGNOSIS — Z862 Personal history of diseases of the blood and blood-forming organs and certain disorders involving the immune mechanism: Secondary | ICD-10-CM | POA: Insufficient documentation

## 2014-02-05 DIAGNOSIS — E663 Overweight: Secondary | ICD-10-CM | POA: Insufficient documentation

## 2014-02-05 DIAGNOSIS — IMO0002 Reserved for concepts with insufficient information to code with codable children: Secondary | ICD-10-CM | POA: Insufficient documentation

## 2014-02-05 DIAGNOSIS — G43909 Migraine, unspecified, not intractable, without status migrainosus: Secondary | ICD-10-CM | POA: Insufficient documentation

## 2014-02-05 DIAGNOSIS — Z79899 Other long term (current) drug therapy: Secondary | ICD-10-CM | POA: Insufficient documentation

## 2014-02-05 DIAGNOSIS — F3289 Other specified depressive episodes: Secondary | ICD-10-CM | POA: Insufficient documentation

## 2014-02-05 DIAGNOSIS — I10 Essential (primary) hypertension: Secondary | ICD-10-CM | POA: Insufficient documentation

## 2014-02-05 DIAGNOSIS — E039 Hypothyroidism, unspecified: Secondary | ICD-10-CM | POA: Insufficient documentation

## 2014-02-05 DIAGNOSIS — R05 Cough: Secondary | ICD-10-CM | POA: Insufficient documentation

## 2014-02-05 DIAGNOSIS — F329 Major depressive disorder, single episode, unspecified: Secondary | ICD-10-CM | POA: Insufficient documentation

## 2014-02-05 DIAGNOSIS — R059 Cough, unspecified: Secondary | ICD-10-CM | POA: Insufficient documentation

## 2014-02-05 DIAGNOSIS — Z8742 Personal history of other diseases of the female genital tract: Secondary | ICD-10-CM | POA: Insufficient documentation

## 2014-02-05 LAB — CBC WITH DIFFERENTIAL/PLATELET
Basophils Absolute: 0 10*3/uL (ref 0.0–0.1)
Basophils Relative: 1 % (ref 0–1)
EOS ABS: 0.2 10*3/uL (ref 0.0–0.7)
EOS PCT: 3 % (ref 0–5)
HCT: 40.6 % (ref 36.0–46.0)
Hemoglobin: 14.1 g/dL (ref 12.0–15.0)
Lymphocytes Relative: 29 % (ref 12–46)
Lymphs Abs: 2 10*3/uL (ref 0.7–4.0)
MCH: 29.7 pg (ref 26.0–34.0)
MCHC: 34.7 g/dL (ref 30.0–36.0)
MCV: 85.7 fL (ref 78.0–100.0)
MONOS PCT: 11 % (ref 3–12)
Monocytes Absolute: 0.7 10*3/uL (ref 0.1–1.0)
Neutro Abs: 3.7 10*3/uL (ref 1.7–7.7)
Neutrophils Relative %: 56 % (ref 43–77)
Platelets: 277 10*3/uL (ref 150–400)
RBC: 4.74 MIL/uL (ref 3.87–5.11)
RDW: 13.6 % (ref 11.5–15.5)
WBC: 6.6 10*3/uL (ref 4.0–10.5)

## 2014-02-05 LAB — COMPREHENSIVE METABOLIC PANEL
ALK PHOS: 80 U/L (ref 39–117)
ALT: 15 U/L (ref 0–35)
AST: 22 U/L (ref 0–37)
Albumin: 4.1 g/dL (ref 3.5–5.2)
BUN: 12 mg/dL (ref 6–23)
CALCIUM: 9.1 mg/dL (ref 8.4–10.5)
CO2: 22 mEq/L (ref 19–32)
Chloride: 103 mEq/L (ref 96–112)
Creatinine, Ser: 0.7 mg/dL (ref 0.50–1.10)
GFR calc non Af Amer: >90 mL/min (ref >90)
GLUCOSE: 91 mg/dL (ref 70–99)
POTASSIUM: 4.1 meq/L (ref 3.7–5.3)
SODIUM: 140 meq/L (ref 137–147)
Total Bilirubin: 0.3 mg/dL (ref 0.3–1.2)
Total Protein: 7.6 g/dL (ref 6.0–8.3)

## 2014-02-05 LAB — TROPONIN I: Troponin I: <0.3 ng/mL (ref <0.30)

## 2014-02-05 LAB — D-DIMER, QUANTITATIVE: D-Dimer, Quant: <0.27 ug/mL-FEU (ref 0.00–0.48)

## 2014-02-05 MED ORDER — HYDROCODONE-HOMATROPINE 5-1.5 MG/5ML PO SYRP: 5.0000 mL | ORAL_SOLUTION | Freq: Four times a day (QID) | ORAL | Status: DC | PRN

## 2014-02-05 MED ORDER — BENZONATATE 100 MG PO CAPS: 100.0000 mg | ORAL_CAPSULE | Freq: Three times a day (TID) | ORAL | Status: DC

## 2014-02-05 MED ORDER — PREDNISONE 50 MG PO TABS: ORAL_TABLET | ORAL | Status: DC

## 2014-02-05 NOTE — ED Notes (Signed)
Pt amb to room 10 with quick steady gait in nad. Pt coughing repeatedly, pt is able to stop coughing to talk and answer questions, then starts coughing again. Pt seen here recently for same, dx bronchitis, rx tessalon pearls and cough medicine here, seen by her pcp on Tuesday and rx z-pack, pt states she feels no better.

## 2014-02-05 NOTE — Discharge Instructions (Signed)
Cough, Adult Follow up with your primary doctor and pulmonologist. Take the steroids as prescribed. Return to the ED if you develop chest pain, shortness of breath, or other concerning symptoms.  A cough is a reflex that helps clear your throat and airways. It can help heal the body or may be a reaction to an irritated airway. A cough may only last 2 or 3 weeks (acute) or may last more than 8 weeks (chronic).  CAUSES Acute cough:  Viral or bacterial infections. Chronic cough:  Infections.  Allergies.  Asthma.  Post-nasal drip.  Smoking.  Heartburn or acid reflux.  Some medicines.  Chronic lung problems (COPD).  Cancer. SYMPTOMS   Cough.  Fever.  Chest pain.  Increased breathing rate.  High-pitched whistling sound when breathing (wheezing).  Colored mucus that you cough up (sputum). TREATMENT   A bacterial cough may be treated with antibiotic medicine.  A viral cough must run its course and will not respond to antibiotics.  Your caregiver may recommend other treatments if you have a chronic cough. HOME CARE INSTRUCTIONS   Only take over-the-counter or prescription medicines for pain, discomfort, or fever as directed by your caregiver. Use cough suppressants only as directed by your caregiver.  Use a cold steam vaporizer or humidifier in your bedroom or home to help loosen secretions.  Sleep in a semi-upright position if your cough is worse at night.  Rest as needed.  Stop smoking if you smoke. SEEK IMMEDIATE MEDICAL CARE IF:   You have pus in your sputum.  Your cough starts to worsen.  You cannot control your cough with suppressants and are losing sleep.  You begin coughing up blood.  You have difficulty breathing.  You develop pain which is getting worse or is uncontrolled with medicine.  You have a fever. MAKE SURE YOU:   Understand these instructions.  Will watch your condition.  Will get help right away if you are not doing well or get  worse. Document Released: 04/06/2011 Document Revised: 12/31/2011 Document Reviewed: 04/06/2011 [facility] Patient Information 2014 [place].

## 2014-02-05 NOTE — ED Provider Notes (Signed)
CSN: [id number]     Arrival date & time 02/05/14  1120 History  This chart was scribed for Ezequiel Essex, MD by Roe Coombs, ED Scribe. The patient was seen in room MH10/MH10. Patient's care was started at 11:49 AM.  Chief Complaint  Patient presents with  . Cough    The history is provided by the patient. No language interpreter was used.    HPI Comments: Gina Wise is a 53 y.o. female who presents to the Emergency Department complaining of constant, moderate cough for the past 1 month. Patient states that in the last few days, cough has started to produce sputum. Patient reports associated chest discomfort with longer coughing episodes. Patient was seen here on 01/29/14 for the same cough and was prescribed Flonase, Hydocan and Tessalon. She states that cough did not improve so she saw her PCP on 02/02/14. He advised her to continue the medications prescribed in the ED. He recommended oral steroids, but she declined. He also recommended that she have a CXR due to her past history of pneumonia, but she declined in favor of treatment with azithromycin. Patient states that she gets allergic bronchitis every spring but this is usually controlled with her inhaler and cough medicine. Currently, she denies abdominal pain, nausea, vomiting, sore throat, headache. She does not smoke. Her other medical history includes HTN, hypothyroidism. Patient has no history of MI or heart disease. Patient does not take any ace inhibitors.   Past Medical History  Diagnosis Date  . Hypothyroid   . Depression   . Overweight   . Migraines   . Hypertension   . Peri-menopause   . Anemia    Past Surgical History  Procedure Laterality Date  . Tonsillectomy    . Rotator cuff repair      left  . Shoulder surgery  [id number]  . Exploratory laparotomy  1987  . Inguinal hernia repair    . Appendectomy  1987  . Tubal ligation  1998    laparoscopic  . Vaginal hysterectomy  04/08/2012    menorrhagia   Family  History  Problem Relation Age of Onset  . Hypertension Father   . Emphysema Father   . Vision loss Father   . Alcohol abuse Father   . Hypertension Mother   . Heart disease Mother   . Hepatitis Mother     Hep C  . Endometriosis Mother   . Lupus Sister   . Endometriosis Sister   . Breast cancer Maternal Aunt     30's  . Breast cancer Paternal Aunt     6's  . Cancer Paternal Uncle     lung smoker  . Cancer Maternal Grandfather     colon  . Cancer Maternal Aunt     uterine-50's  . Cancer Paternal Aunt     cervical-40's  . Cancer Paternal Uncle     lung cancer   History  Substance Use Topics  . Smoking status: Never Smoker   . Smokeless tobacco: Not on file  . Alcohol Use: Yes   OB History   Grav Para Term Preterm Abortions TAB SAB Ect Mult Living   2 2 2       2      Review of Systems A complete 10 system review of systems was obtained and all systems are negative except as noted in the HPI and PMH.    Allergies  Codeine and Erythromycin  Home Medications   Prior to Admission medications   Medication Sig  Start Date End Date Taking? Authorizing Provider  atorvastatin (LIPITOR) 20 MG tablet Take 1 tablet (20 mg total) by mouth daily. 06/15/13   Melony Overly, MD  azithromycin (ZITHROMAX) 250 MG tablet Take two tablet today, then from tomorrow take 1 tablet daily for the next 4 days. 02/02/14   Andrena Mews, MD  benzonatate (TESSALON) 100 MG capsule Take 1 capsule (100 mg total) by mouth every 8 (eight) hours. 01/29/14   Kathalene Frames, MD  busPIRone (BUSPAR) 7.5 MG tablet TAKE ONE TABLET BY MOUTH TWICE DAILY 09/12/13   Kandis Nab, MD  cetirizine (ZYRTEC) 10 MG tablet Take 10 mg by mouth daily.    Historical Provider, MD  fluticasone (FLONASE) 50 MCG/ACT nasal spray Place 2 sprays into the nose daily. 03/27/13   Malvin Johns, MD  hydrochlorothiazide (HYDRODIURIL) 25 MG tablet Take 0.5 tablets (12.5 mg total) by mouth daily. 12/28/13   Kandis Nab, MD   HYDROcodone-homatropine [facility]) 5-1.5 MG/5ML syrup Take 5 mLs by mouth every 6 (six) hours as needed for cough. 01/29/14   Kathalene Frames, MD  levothyroxine (SYNTHROID, LEVOTHROID) 88 MCG tablet Take 1 tablet (88 mcg total) by mouth daily before breakfast. 11/09/13   Kandis Nab, MD  Multiple Vitamins-Minerals (WOMENS MULTIVITAMIN PLUS) TABS Take 1 tablet by mouth daily.      Historical Provider, MD  OMEPRAZOLE PO Take by mouth.    Historical Provider, MD  sertraline (ZOLOFT) 100 MG tablet Take 50 mg by mouth daily. 04/13/13   Kandis Nab, MD  SUMAtriptan (IMITREX) 100 MG tablet Take 1 tablet (100 mg total) by mouth once as needed for migraine. May repeat in 2 hours if headache persists or recurs. 08/25/13 08/25/14  Allen Norris, MD   Triage Vitals: BP 136/99  Pulse 89  Temp(Src) 98.3 F (36.8 C) (Oral)  Resp 20  SpO2 100%  LMP 04/03/2012 Physical Exam  Constitutional: She is oriented to person, place, and time. She appears well-developed and well-nourished. No distress.  HENT:  Head: Normocephalic and atraumatic.  Mouth/Throat: Oropharynx is clear and moist. No oropharyngeal exudate.  Eyes: Conjunctivae and EOM are normal.  Neck: Normal range of motion. Neck supple.  Cardiovascular: Normal rate, regular rhythm and normal heart sounds.   Pulmonary/Chest: Effort normal and breath sounds normal. No respiratory distress. She has no wheezes. She has no rales.  Constantly dry coughing.  Lymphadenopathy:    She has no cervical adenopathy.  Neurological: She is alert and oriented to person, place, and time.  Skin: Skin is warm and dry.  Psychiatric: She has a normal mood and affect. Her behavior is normal.    ED Course  Procedures (including critical care time) DIAGNOSTIC STUDIES: Oxygen Saturation is 100% on room air, normal by my interpretation.    COORDINATION OF CARE: 12:07 PM- Will order CXR, CBC with diff, CMP, troponin and d-dimer. Patient informed of current plan for  treatment and evaluation and agrees with plan at this time.  1:14 PM- Labs negative, CXR negative. Have again recommended steroids which patient is willing to try because she is "miserable." Advised to finish last azithromycin tablet and will write another prescription for Hydocan. Recommended that she schedule follow up with her PCP, and get evaluated by an allergist and pulmonologist. Patient is not on any ace inhibitors.  Results for orders placed during the hospital encounter of 02/05/14  COMPREHENSIVE METABOLIC PANEL      Result Value Ref Range   Sodium 140  137 - 147 mEq/L   Potassium 4.1  3.7 - 5.3 mEq/L   Chloride 103  96 - 112 mEq/L   CO2 22  19 - 32 mEq/L   Glucose, Bld 91  70 - 99 mg/dL   BUN 12  6 - 23 mg/dL   Creatinine, Ser 0.70  0.50 - 1.10 mg/dL   Calcium 9.1  8.4 - 10.5 mg/dL   Total Protein 7.6  6.0 - 8.3 g/dL   Albumin 4.1  3.5 - 5.2 g/dL   AST 22  0 - 37 U/L   ALT 15  0 - 35 U/L   Alkaline Phosphatase 80  39 - 117 U/L   Total Bilirubin 0.3  0.3 - 1.2 mg/dL   GFR calc non Af Amer >90  >90 mL/min   GFR calc Af Amer >90  >90 mL/min  TROPONIN I      Result Value Ref Range   Troponin I <0.30  <0.30 ng/mL  D-DIMER, QUANTITATIVE      Result Value Ref Range   D-Dimer, Quant <0.27  0.00 - 0.48 ug/mL-FEU  CBC WITH DIFFERENTIAL      Result Value Ref Range   WBC 6.6  4.0 - 10.5 K/uL   RBC 4.74  3.87 - 5.11 MIL/uL   Hemoglobin 14.1  12.0 - 15.0 g/dL   HCT 40.6  36.0 - 46.0 %   MCV 85.7  78.0 - 100.0 fL   MCH 29.7  26.0 - 34.0 pg   MCHC 34.7  30.0 - 36.0 g/dL   RDW 13.6  11.5 - 15.5 %   Platelets 277  150 - 400 K/uL   Neutrophils Relative % 56  43 - 77 %   Neutro Abs 3.7  1.7 - 7.7 K/uL   Lymphocytes Relative 29  12 - 46 %   Lymphs Abs 2.0  0.7 - 4.0 K/uL   Monocytes Relative 11  3 - 12 %   Monocytes Absolute 0.7  0.1 - 1.0 K/uL   Eosinophils Relative 3  0 - 5 %   Eosinophils Absolute 0.2  0.0 - 0.7 K/uL   Basophils Relative 1  0 - 1 %   Basophils Absolute 0.0   0.0 - 0.1 K/uL    Imaging Review Dg Chest 2 View  02/05/2014   CLINICAL DATA:  Cough  EXAM: CHEST  2 VIEW  COMPARISON:  06/11/2013  FINDINGS: Cardiac shadow is stable. The lungs are well aerated bilaterally without focal infiltrate or sizable effusion. Mild apical scarring is again seen. No acute bony abnormality is noted.  IMPRESSION: No active cardiopulmonary disease.   Electronically Signed   By: Inez Catalina M.D.   On: 02/05/2014 12:13     EKG Interpretation   Date/Time:  Friday February 05 2014 11:57:20 EDT Ventricular Rate:  86 PR Interval:  156 QRS Duration: 90 QT Interval:  354 QTC Calculation: 423 R Axis:   32 Text Interpretation:  Normal sinus rhythm Nonspecific ST abnormality  Abnormal ECG No significant change was found Confirmed by Wyvonnia Dusky  MD,  Myda Detwiler [phone]) on 02/05/2014 12:10:37 PM      MDM   Final diagnoses:  Cough   Ongoing cough for the past one month. Seen in the ED as well as by PCP. Started on azithromycin 4 days ago as well as cough medicine, antihistamines and nasal spray.  Denies any chest pain or shortness of breath. She is in no distress. Chest x-ray is negative. D-dimer was checked given ongoing  cough and is negative.  She is not any ace inhibitors.  Advised to continue antibiotics. We'll add prednisone which she is agreeable to, add antihistamines and nasal spray and cough medicine which will be refilled. Follow up with PCP and pulmonology.  BP 147/83  Pulse 84  Temp(Src) 98.3 F (36.8 C) (Oral)  Resp 20  SpO2 99%  LMP 04/03/2012   I personally performed the services described in this documentation, which was scribed in my presence. The recorded information has been reviewed and is accurate.   Ezequiel Essex, MD 02/05/14 [phone]

## 2014-02-10 ENCOUNTER — Encounter (HOSPITAL_COMMUNITY): Payer: Self-pay | Admitting: Emergency Medicine

## 2014-02-10 ENCOUNTER — Emergency Department (HOSPITAL_COMMUNITY): Payer: 59

## 2014-02-10 ENCOUNTER — Emergency Department (HOSPITAL_COMMUNITY)
Admission: EM | Admit: 2014-02-10 | Discharge: 2014-02-10 | Disposition: A | Discharge status: Home / Self Care | Payer: 59 | Attending: Emergency Medicine | Admitting: Emergency Medicine

## 2014-02-10 DIAGNOSIS — R05 Cough: Secondary | ICD-10-CM | POA: Insufficient documentation

## 2014-02-10 DIAGNOSIS — R112 Nausea with vomiting, unspecified: Secondary | ICD-10-CM | POA: Insufficient documentation

## 2014-02-10 DIAGNOSIS — R059 Cough, unspecified: Secondary | ICD-10-CM | POA: Insufficient documentation

## 2014-02-10 DIAGNOSIS — R109 Unspecified abdominal pain: Secondary | ICD-10-CM

## 2014-02-10 DIAGNOSIS — R1110 Vomiting, unspecified: Secondary | ICD-10-CM

## 2014-02-10 DIAGNOSIS — F329 Major depressive disorder, single episode, unspecified: Secondary | ICD-10-CM | POA: Insufficient documentation

## 2014-02-10 DIAGNOSIS — I10 Essential (primary) hypertension: Secondary | ICD-10-CM | POA: Insufficient documentation

## 2014-02-10 DIAGNOSIS — IMO0002 Reserved for concepts with insufficient information to code with codable children: Secondary | ICD-10-CM | POA: Insufficient documentation

## 2014-02-10 DIAGNOSIS — R1084 Generalized abdominal pain: Secondary | ICD-10-CM | POA: Insufficient documentation

## 2014-02-10 DIAGNOSIS — E039 Hypothyroidism, unspecified: Secondary | ICD-10-CM | POA: Insufficient documentation

## 2014-02-10 DIAGNOSIS — R197 Diarrhea, unspecified: Secondary | ICD-10-CM | POA: Insufficient documentation

## 2014-02-10 DIAGNOSIS — Z8742 Personal history of other diseases of the female genital tract: Secondary | ICD-10-CM | POA: Insufficient documentation

## 2014-02-10 DIAGNOSIS — E663 Overweight: Secondary | ICD-10-CM | POA: Insufficient documentation

## 2014-02-10 DIAGNOSIS — G43909 Migraine, unspecified, not intractable, without status migrainosus: Secondary | ICD-10-CM | POA: Insufficient documentation

## 2014-02-10 DIAGNOSIS — Z79899 Other long term (current) drug therapy: Secondary | ICD-10-CM | POA: Insufficient documentation

## 2014-02-10 DIAGNOSIS — F3289 Other specified depressive episodes: Secondary | ICD-10-CM | POA: Insufficient documentation

## 2014-02-10 DIAGNOSIS — Z862 Personal history of diseases of the blood and blood-forming organs and certain disorders involving the immune mechanism: Secondary | ICD-10-CM | POA: Insufficient documentation

## 2014-02-10 LAB — COMPREHENSIVE METABOLIC PANEL
ALT: 18 U/L (ref 0–35)
AST: 28 U/L (ref 0–37)
Albumin: 4.1 g/dL (ref 3.5–5.2)
Alkaline Phosphatase: 89 U/L (ref 39–117)
BUN: 11 mg/dL (ref 6–23)
CO2: 19 meq/L (ref 19–32)
CREATININE: 0.65 mg/dL (ref 0.50–1.10)
Calcium: 9.2 mg/dL (ref 8.4–10.5)
Chloride: 102 mEq/L (ref 96–112)
GFR calc Af Amer: >90 mL/min (ref >90)
Glucose, Bld: 119 mg/dL — ABNORMAL HIGH (ref 70–99)
Potassium: 3.5 mEq/L — ABNORMAL LOW (ref 3.7–5.3)
Sodium: 142 mEq/L (ref 137–147)
TOTAL PROTEIN: 8 g/dL (ref 6.0–8.3)
Total Bilirubin: 0.4 mg/dL (ref 0.3–1.2)

## 2014-02-10 LAB — CBC WITH DIFFERENTIAL/PLATELET
BASOS ABS: 0 10*3/uL (ref 0.0–0.1)
BASOS PCT: 0 % (ref 0–1)
EOS ABS: 0.5 10*3/uL (ref 0.0–0.7)
EOS PCT: 3 % (ref 0–5)
HCT: 48.1 % — ABNORMAL HIGH (ref 36.0–46.0)
Hemoglobin: 16.1 g/dL — ABNORMAL HIGH (ref 12.0–15.0)
LYMPHS PCT: 18 % (ref 12–46)
Lymphs Abs: 2.8 10*3/uL (ref 0.7–4.0)
MCH: 29.5 pg (ref 26.0–34.0)
MCHC: 33.5 g/dL (ref 30.0–36.0)
MCV: 88.3 fL (ref 78.0–100.0)
Monocytes Absolute: 0.8 10*3/uL (ref 0.1–1.0)
Monocytes Relative: 5 % (ref 3–12)
Neutro Abs: 11.3 10*3/uL — ABNORMAL HIGH (ref 1.7–7.7)
Neutrophils Relative %: 74 % (ref 43–77)
PLATELETS: 323 10*3/uL (ref 150–400)
RBC: 5.45 MIL/uL — ABNORMAL HIGH (ref 3.87–5.11)
RDW: 13.7 % (ref 11.5–15.5)
WBC: 15.5 10*3/uL — ABNORMAL HIGH (ref 4.0–10.5)

## 2014-02-10 LAB — URINALYSIS, ROUTINE W REFLEX MICROSCOPIC
Bilirubin Urine: NEGATIVE
Glucose, UA: NEGATIVE mg/dL
Hgb urine dipstick: NEGATIVE
Ketones, ur: NEGATIVE mg/dL
Leukocytes, UA: NEGATIVE
Nitrite: NEGATIVE
PROTEIN: NEGATIVE mg/dL
SPECIFIC GRAVITY, URINE: 1.014 (ref 1.005–1.030)
UROBILINOGEN UA: 1 mg/dL (ref 0.0–1.0)
pH: 7.5 (ref 5.0–8.0)

## 2014-02-10 LAB — LIPASE, BLOOD: Lipase: 29 U/L (ref 11–59)

## 2014-02-10 MED ORDER — MORPHINE SULFATE 4 MG/ML IJ SOLN
4.0000 mg | Freq: Once | INTRAMUSCULAR | Status: AC
  Administered 2014-02-10: 4 mg via INTRAVENOUS
  Filled 2014-02-10: qty 1

## 2014-02-10 MED ORDER — ONDANSETRON HCL 4 MG/2ML IJ SOLN
4.0000 mg | Freq: Once | INTRAMUSCULAR | Status: AC
  Administered 2014-02-10: 4 mg via INTRAVENOUS
  Filled 2014-02-10: qty 2

## 2014-02-10 MED ORDER — OXYCODONE-ACETAMINOPHEN 5-325 MG PO TABS: 1.0000 | ORAL_TABLET | Freq: Four times a day (QID) | ORAL | Status: DC | PRN

## 2014-02-10 MED ORDER — SODIUM CHLORIDE 0.9 % IV BOLUS (SEPSIS)
1000.0000 mL | INTRAVENOUS | Status: AC
  Administered 2014-02-10: 1000 mL via INTRAVENOUS

## 2014-02-10 MED ORDER — ONDANSETRON 4 MG PO TBDP: ORAL_TABLET | ORAL | Status: DC

## 2014-02-10 NOTE — ED Notes (Signed)
Pt is back in the room.

## 2014-02-10 NOTE — ED Notes (Signed)
Patient is alert and orientedx4.  Patient was explained discharge instructions and they understood them with no questions.   

## 2014-02-10 NOTE — ED Notes (Signed)
Pt has had 4mg  IV zofran by EMS

## 2014-02-10 NOTE — ED Notes (Signed)
Patient transported to X-ray 

## 2014-02-10 NOTE — ED Provider Notes (Signed)
CSN: [id number]     Arrival date & time 02/10/14  1103 History   First MD Initiated Contact with Patient 02/10/14 1104     Chief Complaint  Patient presents with  . Abdominal Pain     (Consider location/radiation/quality/duration/timing/severity/associated sxs/prior Treatment) Patient is a 53 y.o. female presenting with abdominal pain. The history is provided by the patient.  Abdominal Pain Pain location:  Generalized Pain quality: cramping   Pain radiates to:  Does not radiate Pain severity:  Mild Onset quality:  Sudden Duration:  1 hour Timing:  Constant Progression:  Unchanged Chronicity:  New Context comment:  At rest Relieved by:  Nothing Worsened by:  Nothing tried Ineffective treatments:  None tried Associated symptoms: cough, diarrhea, nausea and vomiting   Associated symptoms: no chest pain, no dysuria, no fatigue, no fever, no hematuria and no shortness of breath     Past Medical History  Diagnosis Date  . Hypothyroid   . Depression   . Overweight   . Migraines   . Hypertension   . Peri-menopause   . Anemia    Past Surgical History  Procedure Laterality Date  . Tonsillectomy    . Rotator cuff repair      left  . Shoulder surgery  [id number]  . Exploratory laparotomy  1987  . Inguinal hernia repair    . Appendectomy  1987  . Tubal ligation  1998    laparoscopic  . Vaginal hysterectomy  04/08/2012    menorrhagia   Family History  Problem Relation Age of Onset  . Hypertension Father   . Emphysema Father   . Vision loss Father   . Alcohol abuse Father   . Hypertension Mother   . Heart disease Mother   . Hepatitis Mother     Hep C  . Endometriosis Mother   . Lupus Sister   . Endometriosis Sister   . Breast cancer Maternal Aunt     30's  . Breast cancer Paternal Aunt     9's  . Cancer Paternal Uncle     lung smoker  . Cancer Maternal Grandfather     colon  . Cancer Maternal Aunt     uterine-50's  . Cancer Paternal Aunt     cervical-40's   . Cancer Paternal Uncle     lung cancer   History  Substance Use Topics  . Smoking status: Never Smoker   . Smokeless tobacco: Not on file  . Alcohol Use: Yes   OB History   Grav Para Term Preterm Abortions TAB SAB Ect Mult Living   2 2 2       2      Review of Systems  Constitutional: Negative for fever and fatigue.  HENT: Negative for congestion and drooling.   Eyes: Negative for pain.  Respiratory: Positive for cough. Negative for shortness of breath.   Cardiovascular: Negative for chest pain.  Gastrointestinal: Positive for nausea, vomiting, abdominal pain and diarrhea.  Genitourinary: Negative for dysuria and hematuria.  Musculoskeletal: Negative for back pain, gait problem and neck pain.  Skin: Negative for color change.  Neurological: Negative for dizziness and headaches.  Hematological: Negative for adenopathy.  Psychiatric/Behavioral: Negative for behavioral problems.  All other systems reviewed and are negative.     Allergies  Codeine and Erythromycin  Home Medications   Prior to Admission medications   Medication Sig Start Date End Date Taking? Authorizing Provider  atorvastatin (LIPITOR) 20 MG tablet Take 1 tablet (20 mg total) by mouth  daily. 06/15/13   Melony Overly, MD  azithromycin (ZITHROMAX) 250 MG tablet Take two tablet today, then from tomorrow take 1 tablet daily for the next 4 days. 02/02/14   Andrena Mews, MD  benzonatate (TESSALON) 100 MG capsule Take 1 capsule (100 mg total) by mouth every 8 (eight) hours. 02/05/14   Ezequiel Essex, MD  busPIRone (BUSPAR) 7.5 MG tablet TAKE ONE TABLET BY MOUTH TWICE DAILY 09/12/13   Kandis Nab, MD  cetirizine (ZYRTEC) 10 MG tablet Take 10 mg by mouth daily.    Historical Provider, MD  fluticasone (FLONASE) 50 MCG/ACT nasal spray Place 2 sprays into the nose daily. 03/27/13   Malvin Johns, MD  hydrochlorothiazide (HYDRODIURIL) 25 MG tablet Take 0.5 tablets (12.5 mg total) by mouth daily. 12/28/13   Kandis Nab, MD  HYDROcodone-homatropine [facility]) 5-1.5 MG/5ML syrup Take 5 mLs by mouth every 6 (six) hours as needed for cough. 02/05/14   Ezequiel Essex, MD  levothyroxine (SYNTHROID, LEVOTHROID) 88 MCG tablet Take 1 tablet (88 mcg total) by mouth daily before breakfast. 11/09/13   Kandis Nab, MD  Multiple Vitamins-Minerals (WOMENS MULTIVITAMIN PLUS) TABS Take 1 tablet by mouth daily.      Historical Provider, MD  OMEPRAZOLE PO Take by mouth.    Historical Provider, MD  predniSONE (DELTASONE) 50 MG tablet 1 tablet PO daily 02/05/14   Ezequiel Essex, MD  sertraline (ZOLOFT) 100 MG tablet Take 50 mg by mouth daily. 04/13/13   Kandis Nab, MD  SUMAtriptan (IMITREX) 100 MG tablet Take 1 tablet (100 mg total) by mouth once as needed for migraine. May repeat in 2 hours if headache persists or recurs. 08/25/13 08/25/14  Allen Norris, MD   SpO2 100%  LMP 04/03/2012 Physical Exam  Nursing note and vitals reviewed. Constitutional: She is oriented to person, place, and time. She appears well-developed and well-nourished.  HENT:  Head: Normocephalic.  Mouth/Throat: Oropharynx is clear and moist. No oropharyngeal exudate.  Eyes: Conjunctivae and EOM are normal. Pupils are equal, round, and reactive to light.  Neck: Normal range of motion. Neck supple.  Cardiovascular: Normal rate, regular rhythm, normal heart sounds and intact distal pulses.  Exam reveals no gallop and no friction rub.   No murmur heard. Pulmonary/Chest: Effort normal and breath sounds normal. No respiratory distress. She has no wheezes.  Abdominal: Soft. Bowel sounds are normal. There is no tenderness. There is no rebound and no guarding.  No focal ttp.   Musculoskeletal: Normal range of motion. She exhibits no edema and no tenderness.  Neurological: She is alert and oriented to person, place, and time.  Skin: Skin is warm and dry.  Psychiatric: She has a normal mood and affect. Her behavior is normal.    ED Course   Procedures (including critical care time) Labs Review Labs Reviewed  CBC WITH DIFFERENTIAL - Abnormal; Notable for the following:    WBC 15.5 (*)    RBC 5.45 (*)    Hemoglobin 16.1 (*)    HCT 48.1 (*)    Neutro Abs 11.3 (*)    All other components within normal limits  COMPREHENSIVE METABOLIC PANEL - Abnormal; Notable for the following:    Potassium 3.5 (*)    Glucose, Bld 119 (*)    All other components within normal limits  LIPASE, BLOOD  URINALYSIS, ROUTINE W REFLEX MICROSCOPIC    Imaging Review Dg Chest 2 View  02/10/2014   CLINICAL DATA:  Worsening cough  EXAM: CHEST  2 VIEW  COMPARISON:  02/05/2014  FINDINGS: The heart size and mediastinal contours are within normal limits. Both lungs are clear. The visualized skeletal structures are unremarkable.  IMPRESSION: No active cardiopulmonary disease.   Electronically Signed   By: Kathreen Devoid   On: 02/10/2014 13:38   Dg Abd 1 View  02/10/2014   CLINICAL DATA:  Abdominal cramping  EXAM: ABDOMEN - 1 VIEW  COMPARISON:  CT abdomen 10/17/2009  FINDINGS: The bowel gas pattern is normal. No radio-opaque calculi or other significant radiographic abnormality are seen.  IMPRESSION: Negative.   Electronically Signed   By: Kathreen Devoid   On: 02/10/2014 13:39     EKG Interpretation None      MDM   Final diagnoses:  Abdominal pain  Vomiting  Diarrhea    11:14 AM 53 y.o. female w a hx of Hypothyroid, migraines, anemia who presents with sudden onset abdominal cramping, nausea, vomiting, and diarrhea which began approximately one hour ago. She's been seen here recently for a cough. She states that she just finished a Z-Pak. She denies any fevers but notes that her cough has been worsening now productive of yellow sputum. She notes that her diarrhea and emesis are watery in appearance without evidence of blood. She is afebrile and vital signs are unremarkable here. She currently has 5/10 abdominal cramping. Abdomen is soft and does not appear  to have any focal tenderness. We'll start with screening labwork and imaging.  3:33 PM: Pain improved w/ pain medicine in ED. Mildly elev wbc count. Labs/imaging otherwise non-contrib.  Discussed possibility of imaging, but I suspect viral gastro. Pt agrees and is comfortable w/ outpt tx and return for any worsening.  I have discussed the diagnosis/risks/treatment options with the patient and believe the pt to be eligible for discharge home to follow-up with pcp as needed. We also discussed returning to the ED immediately if new or worsening sx occur. We discussed the sx which are most concerning (e.g., worsening pain, bloody stool, inability to tol po) that necessitate immediate return. Medications administered to the patient during their visit and any new prescriptions provided to the patient are listed below.  Medications given during this visit Medications  ondansetron (ZOFRAN) injection 4 mg (4 mg Intravenous Given 02/10/14 1125)  sodium chloride 0.9 % bolus 1,000 mL (0 mLs Intravenous Stopped 02/10/14 1307)  morphine 4 MG/ML injection 4 mg (4 mg Intravenous Given 02/10/14 1224)  morphine 4 MG/ML injection 4 mg (4 mg Intravenous Given 02/10/14 1346)  ondansetron (ZOFRAN) injection 4 mg (4 mg Intravenous Given 02/10/14 1409)  sodium chloride 0.9 % bolus 1,000 mL (1,000 mLs Intravenous New Bag/Given 02/10/14 1409)    Discharge Medication List as of 02/10/2014  3:36 PM    START taking these medications   Details  ondansetron (ZOFRAN ODT) 4 MG disintegrating tablet 4mg  ODT q4 hours prn nausea/vomit, Print    oxyCODONE-acetaminophen (PERCOCET) 5-325 MG per tablet Take 1-2 tablets by mouth every 6 (six) hours as needed., Starting 02/10/2014, Until Discontinued, Print         Blanchard Kelch, MD 02/11/14 [phone]

## 2014-02-10 NOTE — ED Notes (Signed)
Per EMS pt at work- c/o nausea, vomiting, diarrhea  And cough starting at 9am. Pt sts felt fine waking up and going to work.   Pt has had bronchitis for the past few weeks- lung sounds clear. spO2 -100%/RA

## 2014-02-10 NOTE — Discharge Instructions (Signed)
Abdominal Pain, Women °Abdominal (stomach, pelvic, or belly) pain can be caused by many things. It is important to tell your doctor: °· The location of the pain. °· Does it come and go or is it present all the time? °· Are there things that start the pain (eating certain foods, exercise)? °· Are there other symptoms associated with the pain (fever, nausea, vomiting, diarrhea)? °All of this is helpful to know when trying to find the cause of the pain. °CAUSES  °· Stomach: virus or bacteria infection, or ulcer. °· Intestine: appendicitis (inflamed appendix), regional ileitis (Crohn's disease), ulcerative colitis (inflamed colon), irritable bowel syndrome, diverticulitis (inflamed diverticulum of the colon), or cancer of the stomach or intestine. °· Gallbladder disease or stones in the gallbladder. °· Kidney disease, kidney stones, or infection. °· Pancreas infection or cancer. °· Fibromyalgia (pain disorder). °· Diseases of the female organs: °· Uterus: fibroid (non-cancerous) tumors or infection. °· Fallopian tubes: infection or tubal pregnancy. °· Ovary: cysts or tumors. °· Pelvic adhesions (scar tissue). °· Endometriosis (uterus lining tissue growing in the pelvis and on the pelvic organs). °· Pelvic congestion syndrome (female organs filling up with blood just before the menstrual period). °· Pain with the menstrual period. °· Pain with ovulation (producing an egg). °· Pain with an IUD (intrauterine device, birth control) in the uterus. °· Cancer of the female organs. °· Functional pain (pain not caused by a disease, may improve without treatment). °· Psychological pain. °· Depression. °DIAGNOSIS  °Your doctor will decide the seriousness of your pain by doing an examination. °· Blood tests. °· X-rays. °· Ultrasound. °· CT scan (computed tomography, special type of X-ray). °· MRI (magnetic resonance imaging). °· Cultures, for infection. °· Barium enema (dye inserted in the large intestine, to better view it with  X-rays). °· Colonoscopy (looking in intestine with a lighted tube). °· Laparoscopy (minor surgery, looking in abdomen with a lighted tube). °· Major abdominal exploratory surgery (looking in abdomen with a large incision). °TREATMENT  °The treatment will depend on the cause of the pain.  °· Many cases can be observed and treated at home. °· Over-the-counter medicines recommended by your caregiver. °· Prescription medicine. °· Antibiotics, for infection. °· Birth control pills, for painful periods or for ovulation pain. °· Hormone treatment, for endometriosis. °· Nerve blocking injections. °· Physical therapy. °· Antidepressants. °· Counseling with a psychologist or psychiatrist. °· Minor or major surgery. °HOME CARE INSTRUCTIONS  °· Do not take laxatives, unless directed by your caregiver. °· Take over-the-counter pain medicine only if ordered by your caregiver. Do not take aspirin because it can cause an upset stomach or bleeding. °· Try a clear liquid diet (broth or water) as ordered by your caregiver. Slowly move to a bland diet, as tolerated, if the pain is related to the stomach or intestine. °· Have a thermometer and take your temperature several times a day, and record it. °· Bed rest and sleep, if it helps the pain. °· Avoid sexual intercourse, if it causes pain. °· Avoid stressful situations. °· Keep your follow-up appointments and tests, as your caregiver orders. °· If the pain does not go away with medicine or surgery, you may try: °· Acupuncture. °· Relaxation exercises (yoga, meditation). °· Group therapy. °· Counseling. °SEEK MEDICAL CARE IF:  °· You notice certain foods cause stomach pain. °· Your home care treatment is not helping your pain. °· You need stronger pain medicine. °· You want your IUD removed. °· You feel faint or   lightheaded. °· You develop nausea and vomiting. °· You develop a rash. °· You are having side effects or an allergy to your medicine. °SEEK IMMEDIATE MEDICAL CARE IF:  °· Your  pain does not go away or gets worse. °· You have a fever. °· Your pain is felt only in portions of the abdomen. The right side could possibly be appendicitis. The left lower portion of the abdomen could be colitis or diverticulitis. °· You are passing blood in your stools (bright red or black tarry stools, with or without vomiting). °· You have blood in your urine. °· You develop chills, with or without a fever. °· You pass out. °MAKE SURE YOU:  °· Understand these instructions. °· Will watch your condition. °· Will get help right away if you are not doing well or get worse. °Document Released: 08/05/2007 Document Revised: 12/31/2011 Document Reviewed: 08/25/2009 °[employer]® Patient Information ©2014 [employer]. ° °

## 2014-02-10 NOTE — ED Notes (Signed)
Patient cleaned up and clean bed pan placed under patient. Pt sts feels well enough to go but would still like to be on bed pan.   RN called xray to make aware of situation-

## 2014-02-12 ENCOUNTER — Emergency Department (HOSPITAL_COMMUNITY)
Admission: EM | Admit: 2014-02-12 | Discharge: 2014-02-12 | Disposition: A | Discharge status: Home / Self Care | Payer: 59 | Attending: Emergency Medicine | Admitting: Emergency Medicine

## 2014-02-12 ENCOUNTER — Emergency Department (HOSPITAL_COMMUNITY): Payer: 59

## 2014-02-12 ENCOUNTER — Encounter (HOSPITAL_COMMUNITY): Payer: Self-pay | Admitting: Emergency Medicine

## 2014-02-12 DIAGNOSIS — R142 Eructation: Secondary | ICD-10-CM

## 2014-02-12 DIAGNOSIS — Z862 Personal history of diseases of the blood and blood-forming organs and certain disorders involving the immune mechanism: Secondary | ICD-10-CM | POA: Insufficient documentation

## 2014-02-12 DIAGNOSIS — R1110 Vomiting, unspecified: Secondary | ICD-10-CM

## 2014-02-12 DIAGNOSIS — F3289 Other specified depressive episodes: Secondary | ICD-10-CM | POA: Insufficient documentation

## 2014-02-12 DIAGNOSIS — E039 Hypothyroidism, unspecified: Secondary | ICD-10-CM | POA: Insufficient documentation

## 2014-02-12 DIAGNOSIS — R197 Diarrhea, unspecified: Secondary | ICD-10-CM | POA: Insufficient documentation

## 2014-02-12 DIAGNOSIS — R059 Cough, unspecified: Secondary | ICD-10-CM | POA: Insufficient documentation

## 2014-02-12 DIAGNOSIS — R141 Gas pain: Secondary | ICD-10-CM | POA: Insufficient documentation

## 2014-02-12 DIAGNOSIS — Z79899 Other long term (current) drug therapy: Secondary | ICD-10-CM | POA: Insufficient documentation

## 2014-02-12 DIAGNOSIS — R143 Flatulence: Secondary | ICD-10-CM

## 2014-02-12 DIAGNOSIS — E876 Hypokalemia: Secondary | ICD-10-CM

## 2014-02-12 DIAGNOSIS — R112 Nausea with vomiting, unspecified: Secondary | ICD-10-CM | POA: Insufficient documentation

## 2014-02-12 DIAGNOSIS — I10 Essential (primary) hypertension: Secondary | ICD-10-CM | POA: Insufficient documentation

## 2014-02-12 DIAGNOSIS — Z8742 Personal history of other diseases of the female genital tract: Secondary | ICD-10-CM | POA: Insufficient documentation

## 2014-02-12 DIAGNOSIS — E663 Overweight: Secondary | ICD-10-CM | POA: Insufficient documentation

## 2014-02-12 DIAGNOSIS — F329 Major depressive disorder, single episode, unspecified: Secondary | ICD-10-CM | POA: Insufficient documentation

## 2014-02-12 DIAGNOSIS — R05 Cough: Secondary | ICD-10-CM | POA: Insufficient documentation

## 2014-02-12 LAB — COMPREHENSIVE METABOLIC PANEL WITH GFR
ALT: 27 U/L (ref 0–35)
AST: 26 U/L (ref 0–37)
Albumin: 3.9 g/dL (ref 3.5–5.2)
Alkaline Phosphatase: 85 U/L (ref 39–117)
BUN: 15 mg/dL (ref 6–23)
CO2: 22 meq/L (ref 19–32)
Calcium: 9.2 mg/dL (ref 8.4–10.5)
Chloride: 102 meq/L (ref 96–112)
Creatinine, Ser: 0.77 mg/dL (ref 0.50–1.10)
GFR calc Af Amer: >90 mL/min
GFR calc non Af Amer: >90 mL/min
Glucose, Bld: 97 mg/dL (ref 70–99)
Potassium: 3.2 meq/L — ABNORMAL LOW (ref 3.7–5.3)
Sodium: 141 meq/L (ref 137–147)
Total Bilirubin: 0.4 mg/dL (ref 0.3–1.2)
Total Protein: 7.6 g/dL (ref 6.0–8.3)

## 2014-02-12 LAB — URINALYSIS, ROUTINE W REFLEX MICROSCOPIC
Bilirubin Urine: NEGATIVE
Glucose, UA: NEGATIVE mg/dL
Ketones, ur: 15 mg/dL — AB
Nitrite: NEGATIVE
PROTEIN: NEGATIVE mg/dL
Specific Gravity, Urine: 1.011 (ref 1.005–1.030)
Urobilinogen, UA: 1 mg/dL (ref 0.0–1.0)
pH: 7.5 (ref 5.0–8.0)

## 2014-02-12 LAB — CBC WITH DIFFERENTIAL/PLATELET
BASOS ABS: 0 10*3/uL (ref 0.0–0.1)
Basophils Relative: 0 % (ref 0–1)
Eosinophils Absolute: 0.2 10*3/uL (ref 0.0–0.7)
Eosinophils Relative: 2 % (ref 0–5)
HCT: 44.3 % (ref 36.0–46.0)
Hemoglobin: 15.3 g/dL — ABNORMAL HIGH (ref 12.0–15.0)
LYMPHS PCT: 17 % (ref 12–46)
Lymphs Abs: 2.1 10*3/uL (ref 0.7–4.0)
MCH: 29.5 pg (ref 26.0–34.0)
MCHC: 34.5 g/dL (ref 30.0–36.0)
MCV: 85.5 fL (ref 78.0–100.0)
Monocytes Absolute: 1.1 10*3/uL — ABNORMAL HIGH (ref 0.1–1.0)
Monocytes Relative: 9 % (ref 3–12)
NEUTROS ABS: 8.8 10*3/uL — AB (ref 1.7–7.7)
Neutrophils Relative %: 72 % (ref 43–77)
PLATELETS: 327 10*3/uL (ref 150–400)
RBC: 5.18 MIL/uL — ABNORMAL HIGH (ref 3.87–5.11)
RDW: 13.5 % (ref 11.5–15.5)
WBC: 12.3 10*3/uL — AB (ref 4.0–10.5)

## 2014-02-12 LAB — URINE MICROSCOPIC-ADD ON

## 2014-02-12 LAB — LIPASE, BLOOD: Lipase: 50 U/L (ref 11–59)

## 2014-02-12 MED ORDER — ONDANSETRON HCL 4 MG/2ML IJ SOLN
4.0000 mg | Freq: Once | INTRAMUSCULAR | Status: AC
  Administered 2014-02-12: 4 mg via INTRAVENOUS
  Filled 2014-02-12: qty 2

## 2014-02-12 MED ORDER — MORPHINE SULFATE 4 MG/ML IJ SOLN
4.0000 mg | Freq: Once | INTRAMUSCULAR | Status: AC
  Administered 2014-02-12: 4 mg via INTRAVENOUS
  Filled 2014-02-12: qty 1

## 2014-02-12 MED ORDER — METHYLPREDNISOLONE SODIUM SUCC 125 MG IJ SOLR
125.0000 mg | Freq: Once | INTRAMUSCULAR | Status: AC
  Administered 2014-02-12: 125 mg via INTRAVENOUS
  Filled 2014-02-12: qty 2

## 2014-02-12 MED ORDER — POTASSIUM CHLORIDE CRYS ER 20 MEQ PO TBCR: 20.0000 meq | EXTENDED_RELEASE_TABLET | Freq: Every day | ORAL | Status: DC

## 2014-02-12 MED ORDER — POTASSIUM CHLORIDE CRYS ER 20 MEQ PO TBCR
40.0000 meq | EXTENDED_RELEASE_TABLET | Freq: Once | ORAL | Status: AC
  Administered 2014-02-12: 40 meq via ORAL
  Filled 2014-02-12: qty 2

## 2014-02-12 MED ORDER — DIPHENHYDRAMINE HCL 25 MG PO CAPS
50.0000 mg | ORAL_CAPSULE | Freq: Once | ORAL | Status: AC
  Administered 2014-02-12: 50 mg via ORAL
  Filled 2014-02-12: qty 2

## 2014-02-12 MED ORDER — SODIUM CHLORIDE 0.9 % IV BOLUS (SEPSIS)
1000.0000 mL | Freq: Once | INTRAVENOUS | Status: AC
  Administered 2014-02-12: 1000 mL via INTRAVENOUS

## 2014-02-12 MED ORDER — IOHEXOL 300 MG/ML  SOLN: 25.0000 mL | INTRAMUSCULAR | Status: DC

## 2014-02-12 MED ORDER — IOHEXOL 300 MG/ML  SOLN
100.0000 mL | Freq: Once | INTRAMUSCULAR | Status: AC | PRN
  Administered 2014-02-12: 100 mL via INTRAVENOUS

## 2014-02-12 NOTE — ED Provider Notes (Signed)
CSN: [id number]     Arrival date & time 02/12/14  1536 History   First MD Initiated Contact with Patient 02/12/14 1538     Chief Complaint  Patient presents with  . Abdominal Pain     (Consider location/radiation/quality/duration/timing/severity/associated sxs/prior Treatment) HPI 53 year old female presents with sudden onset of abdominal pain, vomiting and diarrhea. She states started about 3 hours ago. 2 days ago she had similar symptoms and was seen here in the ER. She had chest and abdominal x-ray were normal. Her provider in her discussed a CT scan but at that time she declined she felt was more of a virus. She states she recently got off antibiotics for bronchitis that she's had for 3 weeks. She started having liquid diet and slowly progressing to chicken soup today. She had absolutely no symptoms yesterday and thus did not need the Percocet or Zofran she was prescribed. However today shortly after drinking chicken no soup her symptoms seem to recur. She has diffuse abdominal pain that she describes as dull. She's been having numerous bowel movements there've been watery without blood. No fevers. No urinary symptoms. Her pain is severe at this time.  Past Medical History  Diagnosis Date  . Hypothyroid   . Depression   . Overweight   . Migraines   . Hypertension   . Peri-menopause   . Anemia    Past Surgical History  Procedure Laterality Date  . Tonsillectomy    . Rotator cuff repair      left  . Shoulder surgery  [id number]  . Exploratory laparotomy  1987  . Inguinal hernia repair    . Appendectomy  1987  . Tubal ligation  1998    laparoscopic  . Vaginal hysterectomy  04/08/2012    menorrhagia   Family History  Problem Relation Age of Onset  . Hypertension Father   . Emphysema Father   . Vision loss Father   . Alcohol abuse Father   . Hypertension Mother   . Heart disease Mother   . Hepatitis Mother     Hep C  . Endometriosis Mother   . Lupus Sister   .  Endometriosis Sister   . Breast cancer Maternal Aunt     30's  . Breast cancer Paternal Aunt     83's  . Cancer Paternal Uncle     lung smoker  . Cancer Maternal Grandfather     colon  . Cancer Maternal Aunt     uterine-50's  . Cancer Paternal Aunt     cervical-40's  . Cancer Paternal Uncle     lung cancer   History  Substance Use Topics  . Smoking status: Never Smoker   . Smokeless tobacco: Not on file  . Alcohol Use: Yes   OB History   Grav Para Term Preterm Abortions TAB SAB Ect Mult Living   2 2 2       2      Review of Systems  Constitutional: Negative for fever.  Respiratory: Positive for cough. Negative for shortness of breath.   Gastrointestinal: Positive for nausea, vomiting, abdominal pain, diarrhea and abdominal distention. Negative for blood in stool.  Genitourinary: Negative for dysuria.  Musculoskeletal: Negative for back pain.  All other systems reviewed and are negative.     Allergies  Codeine and Erythromycin  Home Medications   Prior to Admission medications   Medication Sig Start Date End Date Taking? Authorizing Provider  hydrochlorothiazide (HYDRODIURIL) 25 MG tablet Take 0.5 tablets (12.5 mg  total) by mouth daily. 12/28/13   Kandis Nab, MD  levothyroxine (SYNTHROID, LEVOTHROID) 88 MCG tablet Take 1 tablet (88 mcg total) by mouth daily before breakfast. 11/09/13   Kandis Nab, MD  ondansetron (ZOFRAN ODT) 4 MG disintegrating tablet 4mg  ODT q4 hours prn nausea/vomit 02/10/14   Blanchard Kelch, MD  oxyCODONE-acetaminophen (PERCOCET) 5-325 MG per tablet Take 1-2 tablets by mouth every 6 (six) hours as needed. 02/10/14   Blanchard Kelch, MD  sertraline (ZOLOFT) 100 MG tablet Take 50 mg by mouth daily. 04/13/13   Kandis Nab, MD   BP 123/71  Pulse 83  Temp(Src) 97.6 F (36.4 C) (Oral)  Resp 18  SpO2 100%  LMP 04/03/2012 Physical Exam  Nursing note and vitals reviewed. Constitutional: She is oriented to person, place, and time.  She appears well-developed and well-nourished. No distress.  HENT:  Head: Normocephalic and atraumatic.  Right Ear: External ear normal.  Left Ear: External ear normal.  Nose: Nose normal.  Eyes: Right eye exhibits no discharge. Left eye exhibits no discharge.  Cardiovascular: Normal rate, regular rhythm and normal heart sounds.   Pulmonary/Chest: Effort normal and breath sounds normal.  Abdominal: Soft. She exhibits no distension. There is tenderness (diffuse tenderness, worst in lower abdomen).  Neurological: She is alert and oriented to person, place, and time.  Skin: Skin is warm and dry.    ED Course  Procedures (including critical care time) Labs Review Labs Reviewed  CBC WITH DIFFERENTIAL - Abnormal; Notable for the following:    WBC 12.3 (*)    RBC 5.18 (*)    Hemoglobin 15.3 (*)    Neutro Abs 8.8 (*)    Monocytes Absolute 1.1 (*)    All other components within normal limits  COMPREHENSIVE METABOLIC PANEL - Abnormal; Notable for the following:    Potassium 3.2 (*)    All other components within normal limits  URINALYSIS, ROUTINE W REFLEX MICROSCOPIC - Abnormal; Notable for the following:    APPearance CLOUDY (*)    Hgb urine dipstick SMALL (*)    Ketones, ur 15 (*)    Leukocytes, UA TRACE (*)    All other components within normal limits  URINE MICROSCOPIC-ADD ON - Abnormal; Notable for the following:    Bacteria, UA FEW (*)    All other components within normal limits  LIPASE, BLOOD    Imaging Review Ct Abdomen Pelvis W Contrast  02/12/2014   CLINICAL DATA:  Lower abdominal pain.  EXAM: CT ABDOMEN AND PELVIS WITH CONTRAST  TECHNIQUE: Multidetector CT imaging of the abdomen and pelvis was performed using the standard protocol following bolus administration of intravenous contrast.  CONTRAST:  100 mL OMNIPAQUE IOHEXOL 300 MG/ML  SOLN  COMPARISON:  CT abdomen and pelvis 10/17/2009.  FINDINGS: Dependent atelectasis is seen in the lung bases. No pleural or pericardial  effusion.  There is minimal intrahepatic biliary ductal prominence. The common bile duct, liver, spleen, adrenal glands and pancreas are unremarkable. Tiny low attenuating lesions in the left kidneys cannot be definitively characterized but likely represent cysts. The stomach and small and large bowel appear normal. The appendix is not visualized and may have been removed. No evidence of inflammatory process is seen. There is no lymphadenopathy or fluid. No focal bony abnormality is seen.  IMPRESSION: No acute finding abdomen or pelvis. No finding to explain the patient's symptoms.   Electronically Signed   By: Inge Rise M.D.   On: 02/12/2014 20:05  EKG Interpretation None      MDM   Final diagnoses:  Vomiting and diarrhea  Hypokalemia    Patient with recurrent nausea, vomiting and diarrhea. Has diffuse, mild abd tenderness. CT shows no acute disease. Likely viral as she has no risk factors for more severe disease such as fevers, bloody stools, recent antibiotics or travel. Symptoms controlled at this time. Recommended she take her pain and nausea meds prescribed 2 days ago as needed. Mild hypokalemia, will replace orally (no issues with this in ED) and follow up with PCP.    Ephraim Hamburger, MD 02/13/14 [phone]

## 2014-02-12 NOTE — Discharge Instructions (Signed)
Discharge instruction given to pt.

## 2014-02-12 NOTE — ED Notes (Signed)
Pt to ED via EMS with c/o abdominal pain associated with nausea/vomiting and diarrhea, onset around 1300 today. Pt states was feeling fine drinking liquid but when she started introducing soup and crackers, she started having n/v/d. Pt states she was seen last Wednesday for the same, prescribed percocet and zofran but she has not been taking them.

## 2014-02-12 NOTE — ED Notes (Signed)
CT, Nunzio Cory, notified that pt finished with contrast and pre-ct med given.

## 2014-02-12 NOTE — ED Notes (Addendum)
Pt c/o increased pain 7/10, Dr. Regenia Skeeter made notified he ordered 4mg  morphine.

## 2014-02-15 ENCOUNTER — Telehealth: Payer: Self-pay | Admitting: Family Medicine

## 2014-02-15 ENCOUNTER — Ambulatory Visit
Admission: RE | Admit: 2014-02-15 | Discharge: 2014-02-15 | Disposition: A | Discharge status: Home / Self Care | Payer: 59 | Source: Ambulatory Visit | Attending: Family Medicine | Admitting: Family Medicine

## 2014-02-15 ENCOUNTER — Ambulatory Visit (INDEPENDENT_AMBULATORY_CARE_PROVIDER_SITE_OTHER): Payer: 59 | Admitting: Family Medicine

## 2014-02-15 ENCOUNTER — Encounter: Payer: Self-pay | Admitting: Family Medicine

## 2014-02-15 VITALS — BP 134/88 | HR 96 | Temp 97.6°F | Ht 66.0 in | Wt 178.0 lb

## 2014-02-15 DIAGNOSIS — R1011 Right upper quadrant pain: Secondary | ICD-10-CM

## 2014-02-15 LAB — CBC WITH DIFFERENTIAL/PLATELET
BASOS ABS: 0.1 10*3/uL (ref 0.0–0.1)
BASOS PCT: 1 % (ref 0–1)
EOS PCT: 3 % (ref 0–5)
Eosinophils Absolute: 0.3 10*3/uL (ref 0.0–0.7)
HCT: 47.1 % — ABNORMAL HIGH (ref 36.0–46.0)
Hemoglobin: 16.1 g/dL — ABNORMAL HIGH (ref 12.0–15.0)
LYMPHS ABS: 3 10*3/uL (ref 0.7–4.0)
Lymphocytes Relative: 36 % (ref 12–46)
MCH: 29.6 pg (ref 26.0–34.0)
MCHC: 34.2 g/dL (ref 30.0–36.0)
MCV: 86.6 fL (ref 78.0–100.0)
Monocytes Absolute: 0.8 10*3/uL (ref 0.1–1.0)
Monocytes Relative: 10 % (ref 3–12)
Neutro Abs: 4.2 10*3/uL (ref 1.7–7.7)
Neutrophils Relative %: 50 % (ref 43–77)
PLATELETS: 368 10*3/uL (ref 150–400)
RBC: 5.44 MIL/uL — ABNORMAL HIGH (ref 3.87–5.11)
RDW: 13.8 % (ref 11.5–15.5)
WBC: 8.4 10*3/uL (ref 4.0–10.5)

## 2014-02-15 LAB — CMP AND LIVER
ALBUMIN: 4.1 g/dL (ref 3.5–5.2)
ALT: 25 U/L (ref 0–35)
AST: 19 U/L (ref 0–37)
Alkaline Phosphatase: 94 U/L (ref 39–117)
BUN: 11 mg/dL (ref 6–23)
Bilirubin, Direct: 0.2 mg/dL (ref 0.0–0.3)
CALCIUM: 9.9 mg/dL (ref 8.4–10.5)
CHLORIDE: 102 meq/L (ref 96–112)
CO2: 22 mEq/L (ref 19–32)
Creat: 0.8 mg/dL (ref 0.50–1.10)
Glucose, Bld: 90 mg/dL (ref 70–99)
Indirect Bilirubin: 0.2 mg/dL (ref 0.2–1.2)
POTASSIUM: 3.8 meq/L (ref 3.5–5.3)
Sodium: 142 mEq/L (ref 135–145)
Total Bilirubin: 0.4 mg/dL (ref 0.3–1.2)
Total Protein: 8.1 g/dL (ref 6.0–8.3)

## 2014-02-15 LAB — LIPASE: Lipase: 39 U/L (ref 11–59)

## 2014-02-15 MED ORDER — PROMETHAZINE HCL 25 MG PO TABS: 25.0000 mg | ORAL_TABLET | Freq: Three times a day (TID) | ORAL | Status: DC | PRN

## 2014-02-15 NOTE — Assessment & Plan Note (Signed)
Patient presents for right upper quadrant abdominal pain, nausea, emesis, and diarrhea. Symptoms are concerning for acute cholecystitis. -Check stat lab work including CMP, CBC, and lipase -Check right upper Quadrant ultrasound (Stat)

## 2014-02-15 NOTE — Addendum Note (Signed)
Addended by: [name] on: 02/15/2014 04:44 PM   Modules accepted: Orders

## 2014-02-15 NOTE — Progress Notes (Addendum)
   Subjective:    Patient ID: Gina Wise, female    DOB: 1960-11-10, 53 y.o.   MRN: [id number]  HPI 53 year old female presents for evaluation of abdominal pain. Patient has had one week history of diffuse abdominal pain (worse in the right upper quadrant), associated nausea, nonbloody nonbilious emesis, and diarrhea. Patient has been evaluated in the emergency room 2 times over the past week. She was first evaluated on 02/12/2014 and diagnosed with viral gastroenteritis, she returned on 02/12/2014 as her symptoms have not improved, CT abdomen and pelvis performed at that time was unremarkable, lab work as outlined in the objective section was also unremarkable. She was prescribed Zofran and Percocet, Zofran has done little to alleviate her nausea, the Percocet makes her more nauseated and does not control her pain, she has had significantly decreased by mouth intake, she has been attempting to sip Pedialyte which causes her to become more nauseous, she describes no dysuria however states that her urine has become more "hot", no vaginal discharge  Of note patient was also evaluated on 02/08/2014 for acute bronchitis, patient states that her respiratory symptoms have improved  Surgical history - patient underwent exploratory laparotomy approximately 25 years ago, she states that she had significant scar tissue surrounding the appendix and underwent appendectomy, she had a tubal ligation in 1992, she had a right inguinal hernia repair in 2005, no previous C-sections, she had a vaginal hysterectomy in 2013, she still has her ovaries   Review of Systems  Constitutional: Positive for fatigue. Negative for fever and chills.  HENT: Negative for congestion and postnasal drip.   Respiratory: Negative for cough and shortness of breath.   Cardiovascular: Negative for chest pain.  Gastrointestinal: Positive for nausea, vomiting, abdominal pain, diarrhea and abdominal distention. Negative for constipation and  blood in stool.       Objective:   Physical Exam Vitals: Reviewed General: Caucasian female, appears in mild distress HEENT: Normocephalic, pupils are equal round and reactive to light, extraocular movements are intact, no scleral icterus, dry mucous membranes, neck was supple Cardiac: Regular rate and rhythm, S1 and S2 present, no murmurs, no heaves or thrills Respiratory: Clear to auscultation bilaterally, normal effort Abdomen: Soft, diffuse tenderness (worst in the RUQ), positive murphy's sign, mild guarding, bowel sounds hypoactive  Reviewed lab work from 02/08/2014- lipase within normal limits, BMP within normal limits except for potassium of 3.2, LFTs within normal limits, CBC showed mildly elevated WBC 12.3 (improved from 15.3 days prior), UA showed a few bacteria, negative nitrates, trace leukocytes    Assessment & Plan:  Please see problem specific assessment and plan.

## 2014-02-15 NOTE — Patient Instructions (Signed)
Abdominal Pain/nausea/diarrhea - unclear source at this time, suspect due the gallbladder, will get stat US of the gallbladder and stat labs

## 2014-02-15 NOTE — Telephone Encounter (Signed)
Discussed negative lab work/RUQ ultrasound with patient. Discussed with her that it is unclear what is causing her symptoms. Offered lactic acid and UA today (patient declines to have lab work drawn this evening, will make lab appointment tomorrow). Will also send prescription for phenergan into the pharmacy to help with nausea, encouraged patient to attempt small amounts of liquid tonight, patient to schedule follow up in the next few days.

## 2014-02-16 ENCOUNTER — Ambulatory Visit (INDEPENDENT_AMBULATORY_CARE_PROVIDER_SITE_OTHER): Payer: 59 | Admitting: *Deleted

## 2014-02-16 ENCOUNTER — Other Ambulatory Visit (INDEPENDENT_AMBULATORY_CARE_PROVIDER_SITE_OTHER): Payer: 59

## 2014-02-16 DIAGNOSIS — R1011 Right upper quadrant pain: Secondary | ICD-10-CM

## 2014-02-16 LAB — POCT URINALYSIS DIPSTICK
Glucose, UA: NEGATIVE
Ketones, UA: NEGATIVE
Leukocytes, UA: NEGATIVE
Nitrite, UA: NEGATIVE
SPEC GRAV UA: 1.025
UROBILINOGEN UA: 1
pH, UA: 6.5

## 2014-02-16 LAB — POCT UA - MICROSCOPIC ONLY: Casts, Ur, LPF, POC: 1

## 2014-02-16 MED ORDER — SODIUM CHLORIDE 0.9 % IV SOLN
INTRAVENOUS | Status: DC
  Administered 2014-02-16: 12:00:00 via INTRAVENOUS

## 2014-02-16 MED ORDER — PROMETHAZINE HCL 25 MG/ML IJ SOLN
25.0000 mg | Freq: Four times a day (QID) | INTRAMUSCULAR | Status: DC | PRN
  Administered 2014-02-16: 25 mg via INTRAVENOUS

## 2014-02-16 MED ORDER — CIPROFLOXACIN HCL 500 MG PO TABS: 500.0000 mg | ORAL_TABLET | Freq: Two times a day (BID) | ORAL | Status: DC

## 2014-02-16 MED ORDER — METRONIDAZOLE 500 MG PO TABS: 500.0000 mg | ORAL_TABLET | Freq: Three times a day (TID) | ORAL | Status: DC

## 2014-02-16 NOTE — Patient Instructions (Signed)
Start Ciprofloxicin (twice daily) and Flagyl (three times daily)  Take Phenergan around the clock (may alternate with Zofran) to decrease nausea.  Collect stool sample and bring to lab tomorrow with you.   Return tomorrow for re-check of pain.

## 2014-02-16 NOTE — Progress Notes (Signed)
Patient ID: Gina Wise, female   DOB: November 09, 1960, 53 y.o.   MRN: [id number]  Patient came to office this AM for lab draw. Patient seen in the lab area and still having RUQ pain, no diarrhea this AM, able to tolerate small amount of broth last night with no emesis, she continues to have nausea, was not able to pick up phenergan last night as she fell asleep. No vaginal discharge, no radiation to pain into the groin, some mild back pain, no flank pain, no hematuria, patient did complete recent Z-pack for acute bronchitis, GI symptoms started after taking the antibiotic  Reviewed patients history with her in more detail, she reports a colonoscopy done 10 years ago in [place], patient reports as normal, she is unclear why she had the colonoscopy performed  Gen: pleasant female, appears in distress, holding abdomen Cardiac: RRR, S1 and S2 present, no murmurs, no heaves/thrills Resp: CTAB, normal effort Abd: soft, normal bowel sounds, tender in the RUQ/RLQ/LLQ (worse in the RUG), no rebound, no peritoneal signs  Patient given 1 L NS bolus in office and 25 mg phenergan IV. Reviewed Urine dipstick performed today.   Reviewed CT abd/pelvis and KUB with Dr. Nori Riis, also reviewed with radiology department. No acute intraabdominal process identified. Radiology noted that contrast did not reach the distal colon therefore distal colitis can not be fully ruled out.   Patient returns for lab work/evaluation of abdominal pain. -improved s/p IV fluid bolus and phenergan -patient to collect stool sample (given recent antibiotic use) to rule out C-dif -urine sent for culture -treat empirically with Cipro and Flagyl to cover possible colitis/C-dif/UTI -patient scheduled for follow up tomorrow. Discussed with patient hat if not improved will consider admission to hospital for GI consult and possible EGD/colonoscopy.  Dossie Arbour MD

## 2014-02-16 NOTE — Progress Notes (Signed)
Patient here today for lab visit.  Per Dr. Jetty Peeks IV of NS 0.9 %--1 liter bolus.  Also give Phenergan 25 mg IV.  IV started--see Specialty Flowsheet for documentation.   Burna Forts, BSN, RN-BC  Patient up to bathroom with assistance and voided x 1.  States nausea is better after Phenergan.  IV fluid completely infused and patient tolerated well.  IV discontinued and patient discharged home per Dr. Ree Kida.  Appt scheduled for tomorrow with Dr. Ree Kida at 1:45 pm; appt card given to patient.  Burna Forts, BSN, RN-BC

## 2014-02-16 NOTE — Progress Notes (Unsigned)
Patient here today for lab visit.  Per Dr. Jetty Peeks IV of NS 0.9 %--1 liter bolus.  Also give Phenergan 25 mg IV.  See separate nurse visit for documentation.  Burna Forts, BSN, RN-BC

## 2014-02-17 ENCOUNTER — Inpatient Hospital Stay (HOSPITAL_COMMUNITY)
Admission: AD | Admit: 2014-02-17 | Discharge: 2014-02-20 | DRG: 392 | Disposition: A | Discharge status: Home / Self Care | Payer: 59 | Source: Ambulatory Visit | Attending: Family Medicine | Admitting: Family Medicine

## 2014-02-17 ENCOUNTER — Ambulatory Visit (INDEPENDENT_AMBULATORY_CARE_PROVIDER_SITE_OTHER): Payer: 59 | Admitting: Family Medicine

## 2014-02-17 ENCOUNTER — Other Ambulatory Visit: Payer: Self-pay | Admitting: Family Medicine

## 2014-02-17 ENCOUNTER — Observation Stay (HOSPITAL_COMMUNITY): Payer: 59

## 2014-02-17 ENCOUNTER — Encounter (HOSPITAL_COMMUNITY): Payer: Self-pay | Admitting: Family Medicine

## 2014-02-17 ENCOUNTER — Encounter: Payer: Self-pay | Admitting: Family Medicine

## 2014-02-17 VITALS — BP 131/95 | Ht 66.0 in

## 2014-02-17 DIAGNOSIS — R112 Nausea with vomiting, unspecified: Secondary | ICD-10-CM | POA: Diagnosis present

## 2014-02-17 DIAGNOSIS — Z9851 Tubal ligation status: Secondary | ICD-10-CM

## 2014-02-17 DIAGNOSIS — Z79899 Other long term (current) drug therapy: Secondary | ICD-10-CM

## 2014-02-17 DIAGNOSIS — Z6379 Other stressful life events affecting family and household: Secondary | ICD-10-CM

## 2014-02-17 DIAGNOSIS — Z888 Allergy status to other drugs, medicaments and biological substances status: Secondary | ICD-10-CM

## 2014-02-17 DIAGNOSIS — G4700 Insomnia, unspecified: Secondary | ICD-10-CM | POA: Diagnosis present

## 2014-02-17 DIAGNOSIS — E785 Hyperlipidemia, unspecified: Secondary | ICD-10-CM | POA: Diagnosis present

## 2014-02-17 DIAGNOSIS — E663 Overweight: Secondary | ICD-10-CM | POA: Diagnosis present

## 2014-02-17 DIAGNOSIS — R1011 Right upper quadrant pain: Secondary | ICD-10-CM | POA: Diagnosis present

## 2014-02-17 DIAGNOSIS — Z801 Family history of malignant neoplasm of trachea, bronchus and lung: Secondary | ICD-10-CM

## 2014-02-17 DIAGNOSIS — A088 Other specified intestinal infections: Principal | ICD-10-CM | POA: Diagnosis present

## 2014-02-17 DIAGNOSIS — R1115 Cyclical vomiting syndrome unrelated to migraine: Secondary | ICD-10-CM

## 2014-02-17 DIAGNOSIS — E039 Hypothyroidism, unspecified: Secondary | ICD-10-CM | POA: Diagnosis present

## 2014-02-17 DIAGNOSIS — Z8249 Family history of ischemic heart disease and other diseases of the circulatory system: Secondary | ICD-10-CM

## 2014-02-17 DIAGNOSIS — Z881 Allergy status to other antibiotic agents status: Secondary | ICD-10-CM

## 2014-02-17 DIAGNOSIS — F341 Dysthymic disorder: Secondary | ICD-10-CM | POA: Diagnosis present

## 2014-02-17 DIAGNOSIS — I10 Essential (primary) hypertension: Secondary | ICD-10-CM | POA: Diagnosis present

## 2014-02-17 DIAGNOSIS — Z9089 Acquired absence of other organs: Secondary | ICD-10-CM

## 2014-02-17 DIAGNOSIS — R51 Headache: Secondary | ICD-10-CM

## 2014-02-17 DIAGNOSIS — Z803 Family history of malignant neoplasm of breast: Secondary | ICD-10-CM

## 2014-02-17 DIAGNOSIS — G43909 Migraine, unspecified, not intractable, without status migrainosus: Secondary | ICD-10-CM | POA: Diagnosis present

## 2014-02-17 DIAGNOSIS — F418 Other specified anxiety disorders: Secondary | ICD-10-CM

## 2014-02-17 HISTORY — DX: Pure hypercholesterolemia, unspecified: E78.00

## 2014-02-17 HISTORY — DX: Other allergy status, other than to drugs and biological substances: Z91.09

## 2014-02-17 HISTORY — DX: Unspecified chronic bronchitis: J42

## 2014-02-17 LAB — COMPREHENSIVE METABOLIC PANEL
ALBUMIN: 3.8 g/dL (ref 3.5–5.2)
ALK PHOS: 82 U/L (ref 39–117)
ALT: 22 U/L (ref 0–35)
AST: 21 U/L (ref 0–37)
BUN: 8 mg/dL (ref 6–23)
CHLORIDE: 106 meq/L (ref 96–112)
CO2: 22 mEq/L (ref 19–32)
Calcium: 8.9 mg/dL (ref 8.4–10.5)
Creatinine, Ser: 0.64 mg/dL (ref 0.50–1.10)
GFR calc Af Amer: >90 mL/min (ref >90)
GFR calc non Af Amer: >90 mL/min (ref >90)
GLUCOSE: 94 mg/dL (ref 70–99)
POTASSIUM: 3.7 meq/L (ref 3.7–5.3)
Sodium: 144 mEq/L (ref 137–147)
Total Bilirubin: 0.5 mg/dL (ref 0.3–1.2)
Total Protein: 7.3 g/dL (ref 6.0–8.3)

## 2014-02-17 LAB — SEDIMENTATION RATE: Sed Rate: 5 mm/hr (ref 0–22)

## 2014-02-17 LAB — RAPID URINE DRUG SCREEN, HOSP PERFORMED
AMPHETAMINES: NOT DETECTED
BENZODIAZEPINES: NOT DETECTED
Barbiturates: NOT DETECTED
COCAINE: NOT DETECTED
OPIATES: NOT DETECTED
Tetrahydrocannabinol: NOT DETECTED

## 2014-02-17 LAB — CBC
HCT: 44.8 % (ref 36.0–46.0)
Hemoglobin: 15.3 g/dL — ABNORMAL HIGH (ref 12.0–15.0)
MCH: 29.8 pg (ref 26.0–34.0)
MCHC: 34.2 g/dL (ref 30.0–36.0)
MCV: 87.2 fL (ref 78.0–100.0)
PLATELETS: 293 10*3/uL (ref 150–400)
RBC: 5.14 MIL/uL — ABNORMAL HIGH (ref 3.87–5.11)
RDW: 13.6 % (ref 11.5–15.5)
WBC: 9.7 10*3/uL (ref 4.0–10.5)

## 2014-02-17 LAB — LIPASE, BLOOD: LIPASE: 43 U/L (ref 11–59)

## 2014-02-17 LAB — PROTIME-INR
INR: 1.05 (ref 0.00–1.49)
Prothrombin Time: 13.5 seconds (ref 11.6–15.2)

## 2014-02-17 LAB — URINE CULTURE

## 2014-02-17 LAB — LACTIC ACID, PLASMA: LACTIC ACID: 1.6 mmol/L (ref 0.5–2.2)

## 2014-02-17 LAB — TSH: TSH: 1.17 u[IU]/mL (ref 0.350–4.500)

## 2014-02-17 MED ORDER — SODIUM CHLORIDE 0.9 % IV BOLUS (SEPSIS)
1000.0000 mL | Freq: Once | INTRAVENOUS | Status: AC
  Administered 2014-02-17: 1000 mL via INTRAVENOUS

## 2014-02-17 MED ORDER — HEPARIN SODIUM (PORCINE) 5000 UNIT/ML IJ SOLN
5000.0000 [IU] | Freq: Three times a day (TID) | INTRAMUSCULAR | Status: DC
  Administered 2014-02-17 – 2014-02-20 (×8): 5000 [IU] via SUBCUTANEOUS
  Filled 2014-02-17 (×11): qty 1

## 2014-02-17 MED ORDER — PROMETHAZINE HCL 25 MG/ML IJ SOLN
25.0000 mg | INTRAMUSCULAR | Status: DC | PRN
  Administered 2014-02-17 – 2014-02-18 (×3): 25 mg via INTRAVENOUS
  Filled 2014-02-17 (×3): qty 1

## 2014-02-17 MED ORDER — FOLIC ACID 5 MG/ML IJ SOLN
1.0000 mg | Freq: Every day | INTRAMUSCULAR | Status: DC
  Administered 2014-02-17 – 2014-02-19 (×3): 1 mg via INTRAVENOUS
  Filled 2014-02-17 (×3): qty 0.2

## 2014-02-17 MED ORDER — SERTRALINE HCL 50 MG PO TABS
50.0000 mg | ORAL_TABLET | Freq: Every day | ORAL | Status: DC
  Administered 2014-02-18 – 2014-02-20 (×3): 50 mg via ORAL
  Filled 2014-02-17 (×3): qty 1

## 2014-02-17 MED ORDER — METRONIDAZOLE IN NACL 5-0.79 MG/ML-% IV SOLN
500.0000 mg | Freq: Two times a day (BID) | INTRAVENOUS | Status: DC
  Administered 2014-02-17 – 2014-02-19 (×5): 500 mg via INTRAVENOUS
  Filled 2014-02-17 (×5): qty 100

## 2014-02-17 MED ORDER — DEXTROSE-NACL 5-0.9 % IV SOLN
INTRAVENOUS | Status: DC
  Administered 2014-02-17 – 2014-02-19 (×3): via INTRAVENOUS

## 2014-02-17 MED ORDER — THIAMINE HCL 100 MG/ML IJ SOLN
100.0000 mg | Freq: Every day | INTRAMUSCULAR | Status: DC
  Administered 2014-02-17 – 2014-02-19 (×3): 100 mg via INTRAVENOUS
  Filled 2014-02-17: qty 1
  Filled 2014-02-17: qty 2
  Filled 2014-02-17: qty 1

## 2014-02-17 MED ORDER — CIPROFLOXACIN IN D5W 400 MG/200ML IV SOLN
400.0000 mg | Freq: Two times a day (BID) | INTRAVENOUS | Status: DC
  Administered 2014-02-17 – 2014-02-19 (×5): 400 mg via INTRAVENOUS
  Filled 2014-02-17 (×6): qty 200

## 2014-02-17 MED ORDER — ONDANSETRON HCL 4 MG/2ML IJ SOLN
4.0000 mg | Freq: Four times a day (QID) | INTRAMUSCULAR | Status: DC
  Administered 2014-02-17 – 2014-02-19 (×9): 4 mg via INTRAVENOUS
  Filled 2014-02-17 (×8): qty 2

## 2014-02-17 MED ORDER — METRONIDAZOLE 500 MG PO TABS: 500.0000 mg | ORAL_TABLET | Freq: Three times a day (TID) | ORAL | Status: DC

## 2014-02-17 MED ORDER — CIPROFLOXACIN HCL 500 MG PO TABS: 500.0000 mg | ORAL_TABLET | Freq: Two times a day (BID) | ORAL | Status: DC

## 2014-02-17 MED ORDER — HYDROCHLOROTHIAZIDE 25 MG PO TABS
12.5000 mg | ORAL_TABLET | Freq: Every day | ORAL | Status: DC
  Filled 2014-02-17: qty 0.5

## 2014-02-17 MED ORDER — MORPHINE SULFATE 2 MG/ML IJ SOLN
2.0000 mg | INTRAMUSCULAR | Status: DC | PRN
  Administered 2014-02-17 – 2014-02-19 (×6): 2 mg via INTRAVENOUS
  Filled 2014-02-17 (×6): qty 1

## 2014-02-17 MED ORDER — LEVOTHYROXINE SODIUM 100 MCG IV SOLR
44.0000 ug | INTRAVENOUS | Status: DC
  Administered 2014-02-18 – 2014-02-19 (×2): 44 ug via INTRAVENOUS
  Filled 2014-02-17 (×3): qty 5

## 2014-02-17 MED ORDER — LEVOTHYROXINE SODIUM 88 MCG PO TABS
88.0000 ug | ORAL_TABLET | Freq: Every day | ORAL | Status: DC
Start: 2014-02-18 — End: 2014-02-17
  Filled 2014-02-17: qty 1

## 2014-02-17 NOTE — Progress Notes (Signed)
   Subjective:    Patient ID: Gina Wise, female    DOB: 1961/08/03, 53 y.o.   MRN: [id number]  HPI 53 y/o female presents for follow up of abdominal pain and intractable nausea/vomiting. Patient was seen yesterday and given 1L NS bolus and IV phenergan. She felt better when leaving the office. She attempted a small amount of toast and immediately had loose watery non-bloody diarrhea, increased nausea, and emesis. She has not had any more diarrhea this AM however has had emesis X3 (some bile, no blood). Pain is still 6/10 (diffuse but worse in the RUQ/epigastrum).    Review of Systems  Cardiovascular: Negative for chest pain.  Gastrointestinal: Positive for nausea, vomiting, abdominal pain and diarrhea.       Objective:   Physical Exam Vitals: reviewed Gen: pleasant female, appears fatigued HEENT: normocephalic, PERRL, EOMI, no scleral icterus, nasal septum midline, no rhinorrhea, dry mucous membranes, uvula midline, neck supple, no anterior or posterior lymphadenopathy Cardiac: RRR, S1 and S2 present, no murmurs, no heaves/thrills Resp: CTAB, normal effort, no cough Abd: soft, mild bilateral lower quadrant tenderness, tenderness most severe in RUQ/epigastrum, no peritoneal signs, mild guarding Ext: no edema, 2+ radial pulses Skin: warm, dry, intact, no rash     Assessment & Plan:  Patient will be admitted to the hospital for intractable nausea and vomiting.

## 2014-02-17 NOTE — Assessment & Plan Note (Signed)
Patient returns for evaluation of abdominal pain (most in RUQ/epigastrum) and intractable nausea/vomiting. KUB/CT abd/pelvis/RUQ ultrasound negative. Patient has had previous appendectomy and hysterectomy. CMP/LA normal. Differential at this time includes prolonged viral gastroenteritis vs c-dif colitis (stool studies obtained) vs. UTI (urine culture pending).  -Will admit to hospital for IVF/IV antiemetics/IV PPI -Will obtain GI consult -Discussed patient with FMTS Resident and Attending Physician

## 2014-02-17 NOTE — H&P (Signed)
[facility] Admission History and Physical Service Pager: [phone]  Patient name: Gina Wise Medical record number: [id number] Date of birth: June 19, 1961 Age: 53 y.o. Gender: female  Primary Care Provider: Liam Graham, MD Consultants: none Code Status: full  Chief Complaint:  N/V/D and Ab pain x 1 wk  Assessment and Plan: Gina Wise is a 53 y.o. female presenting with N/V/D and Ab pain x 1 wk. PMH is significant for hyperthyroidism, HTN, Migraines, Depression & multiple abdominal surgeries (appendectomy, tubal ligation, hysterectomy, inguinal hernia repair)  # Abdominal Pain: Associated w/ N/V/D - Currently unknown etiology. VSS; CT ab/pelvis (4/24) and abdominal US (4/27) have been unremarkable. Lipase wnl. CMET wnl. WBC initially elevated on ED visit 4/22 which has resolved UA reveal small amount of blood, but negative for infection.  - DDx: Viral gastroenteritis (WBC initially elevated, ?prolong course); Cdiff (Recent Zpack); Partial SBO( Imaging negative thus far, but multiple abdominal surgeries); Abdominal migaine (Hx migraine but denies one since hysterectomy, Current HA different from previous migraines); Anxiety/Depresssion (denies current changes in mood/stress, but Flat affect); IBD, IBS - C diff, Giardia/Cryptosporium and Stool Culture: pending - KUB ordered  - Repeat CBC, CMET, Lipase - Check UDS, HIV, ESR - Abx: Flagyl & Cipro (4/29>>)  - Zofran & Phenergan for nausea; Morphine IV for pain - IVF as below; Thiamine & Folate daily - Consider GI consult if not improving   # Hypothyroidism: - Check TSH; Last check/wnl 05/2013 - Synthroid IV while ongoing n/v   # HTN: Currently well controlled - Continue HCTZ - continue to monitor   # Depression - Continue Zoloft  FEN/GI: D5 NS @ 100 cc/hr; NPO Prophylaxis: Heparin Subq  Disposition: Admit to medsurg; Discharge pending GI work-up and improved PO intake with decreased N/V/D  History  of Present Illness: Gina Wise is a 53 y.o. female presenting with N/V/D x 1 week. She reports quick onset of stomach discomfort at work followed by n/v/d causing weakness and EMS trip to ED where she was treated with IVFs. She reports recent bronchitis treated with Zpack which she completed, but denies fevers, chills or viral symptoms immediately prior to n/v/d. She denies eating anything that morning and denies any sick contacts or new medications. She returned home a little better Thursday, but then again experienced n/v/d when she tried to advance her diet. She returned to ED and had CT abdomen/pelvis that was negative for acute pathology, and again was sent home. She reported to [facility] clinic today due to unresolved n/v/d and abdominal pain which she describes and achy diffuse periumbilical and epigastric/RUQ. Food makes the n/v/d worse, but is not effected by movement or position.   She denies any dysuria, blood/mucus in vomit or stool, hx of kidney stones, Fhx of IBD or IBS, current migraine or recent travel/camping/hiking.   Review Of Systems: Per HPI with the following additions:  Otherwise 12 point review of systems was performed and was unremarkable.  Patient Active Problem List   Diagnosis Date Noted  . Intractable nausea and vomiting 02/17/2014  . Abdominal pain, right upper quadrant 02/15/2014  . Goiter 07/03/2013  . Cervical lymphadenopathy 06/15/2013  . Depression with anxiety 06/15/2013  . Cough 01/02/2013  . Headache 10/24/2012  . Iron deficiency 03/13/2012  . Fatigue 03/12/2012  . Dysfunctional uterine bleeding 01/02/2012  . Otitis externa 12/30/2011  . Insomnia 10/03/2011  . Dyslipidemia 09/03/2011  . Neck pain, chronic 09/03/2011  . Overweight 01/03/2011  . 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  MIGRAINE, CHRONIC 10/04/2009  . Unspecified hypothyroidism 09/09/2009  . Essential hypertension, benign 09/09/2009   Past Medical History: Past Medical  History  Diagnosis Date  . Hypothyroid   . Depression   . Overweight   . Migraines   . Hypertension   . Peri-menopause   . Anemia    Past Surgical History: Past Surgical History  Procedure Laterality Date  . Tonsillectomy    . Rotator cuff repair      left  . Shoulder surgery  [id number]  . Exploratory laparotomy  1987  . Inguinal hernia repair    . Appendectomy  1987  . Tubal ligation  1998    laparoscopic  . Vaginal hysterectomy  04/08/2012    menorrhagia   Social History: History  Substance Use Topics  . Smoking status: Never Smoker   . Smokeless tobacco: Not on file  . Alcohol Use: Yes   Additional social history:  Please also refer to relevant sections of EMR.  Family History: Family History  Problem Relation Age of Onset  . Hypertension Father   . Emphysema Father   . Vision loss Father   . Alcohol abuse Father   . Hypertension Mother   . Heart disease Mother   . Hepatitis Mother     Hep C  . Endometriosis Mother   . Lupus Sister   . Endometriosis Sister   . Breast cancer Maternal Aunt     30's  . Breast cancer Paternal Aunt     39's  . Cancer Paternal Uncle     lung smoker  . Cancer Maternal Grandfather     colon  . Cancer Maternal Aunt     uterine-50's  . Cancer Paternal Aunt     cervical-40's  . Cancer Paternal Uncle     lung cancer   Allergies and Medications: Allergies  Allergen Reactions  . Codeine Nausea Only  . Erythromycin Nausea And Vomiting    REACTION: Nausea Severe abdominal pain  . Iohexol Hives   Current Facility-Administered Medications on File Prior to Encounter  Medication Dose Route Frequency Provider Last Rate Last Dose  . 0.9 %  sodium chloride infusion   Intravenous Continuous [name], MD 150 mL/hr at 02/16/14 1213     Current Outpatient Prescriptions on File Prior to Encounter  Medication Sig Dispense Refill  . ciprofloxacin (CIPRO) 500 MG tablet Take 1 tablet (500 mg total) by mouth 2 (two) times daily.   10 tablet  0  . hydrochlorothiazide (HYDRODIURIL) 25 MG tablet Take 0.5 tablets (12.5 mg total) by mouth daily.  15 tablet  3  . levothyroxine (SYNTHROID, LEVOTHROID) 88 MCG tablet Take 1 tablet (88 mcg total) by mouth daily before breakfast.  30 tablet  6  . metroNIDAZOLE (FLAGYL) 500 MG tablet Take 1 tablet (500 mg total) by mouth 3 (three) times daily.  15 tablet  0  . ondansetron (ZOFRAN ODT) 4 MG disintegrating tablet 19m ODT q4 hours prn nausea/vomit  15 tablet  0  . oxyCODONE-acetaminophen (PERCOCET) 5-325 MG per tablet Take 1-2 tablets by mouth every 6 (six) hours as needed.  10 tablet  0  . potassium chloride SA (K-DUR,KLOR-CON) 20 MEQ tablet Take 1 tablet (20 mEq total) by mouth daily.  3 tablet  0  . promethazine (PHENERGAN) 25 MG tablet Take 1 tablet (25 mg total) by mouth every 8 (eight) hours as needed for nausea or vomiting.  20 tablet  1  . sertraline (ZOLOFT) 100 MG tablet Take  50 mg by mouth daily.        Objective: BP 141/78  Pulse 84  Temp(Src) 97.8 F (36.6 C) (Oral)  Resp 18  Ht _0  (1.676 m)  Wt 175 lb 11.3 oz (79.7 kg)  BMI 28.37 kg/m2  SpO2 100%  LMP 04/03/2012 Exam: General: Female; NAD; Flat affect  HEENT: MMM Cardiovascular: RRR no m/r/g Respiratory: CTAB; normal resp effort on RA Abdomen: Soft, mild diffuse tenderness; No BS appreciated; No rebounding or guarding Extremities: WWP; No LE edema Skin: No rash  Labs and Imaging: CBC BMET   Recent Labs Lab 02/15/14 1140  WBC 8.4  HGB 16.1*  HCT 47.1*  PLT 368    Recent Labs Lab 02/15/14 1140  NA 142  K 3.8  CL 102  CO2 22  BUN 11  CREATININE 0.80  GLUCOSE 90  CALCIUM 9.9      Dg Abd 1 View 02/10/2014   FINDINGS: The bowel gas pattern is normal. No radio-opaque calculi or other significant radiographic abnormality are seen.  IMPRESSION: Negative.     Ct Abdomen Pelvis W Contrast 02/12/2014   FINDINGS: Dependent atelectasis is seen in the lung bases. No pleural or pericardial effusion.   There is minimal intrahepatic biliary ductal prominence. The common bile duct, liver, spleen, adrenal glands and pancreas are unremarkable. Tiny low attenuating lesions in the left kidneys cannot be definitively characterized but likely represent cysts. The stomach and small and large bowel appear normal. The appendix is not visualized and may have been removed. No evidence of inflammatory process is seen. There is no lymphadenopathy or fluid. No focal bony abnormality is seen.  IMPRESSION: No acute finding abdomen or pelvis. No finding to explain the patient's symptoms.      US Abdomen Limited Ruq 02/15/2014   FINDINGS: Gallbladder:  Normally distended without stones or wall thickening.  No pericholecystic fluid or sonographic Murphy sign.  Common bile duct:  Diameter: Normal caliber 4 mm diameter  Liver:  Normal appearance  No RIGHT upper quadrant ascites  IMPRESSION: Negative RIGHT upper quadrant ultrasound.     Phill Myron, MD 02/17/2014, 7:35 PM PGY-1, [place] Intern pager: [phone], text pages welcome  Teaching Service Addendum. I have seen and evaluated this pt and agree with Dr. Milagros Reap assessment and plan as it is documented on this note.   D. [place] Philippa Sicks, MD Family Medicine  PGY-3

## 2014-02-17 NOTE — Patient Instructions (Signed)
You will be admitted to the hospital today

## 2014-02-18 DIAGNOSIS — F341 Dysthymic disorder: Secondary | ICD-10-CM

## 2014-02-18 LAB — GIARDIA/CRYPTOSPORIDIUM (EIA)
Cryptosporidium Screen (EIA): NEGATIVE
Giardia Screen (EIA): NEGATIVE

## 2014-02-18 LAB — C. DIFFICILE GDH AND TOXIN A/B
C. DIFF TOXIN A/B: NOT DETECTED
C. difficile GDH: NOT DETECTED

## 2014-02-18 LAB — HIV ANTIBODY (ROUTINE TESTING W REFLEX)
HIV 1&2 Ab, 4th Generation: NONREACTIVE
HIV 1&2 Ab, 4th Generation: NONREACTIVE

## 2014-02-18 MED ORDER — BIOTENE DRY MOUTH MT LIQD
15.0000 mL | Freq: Two times a day (BID) | OROMUCOSAL | Status: DC
Start: 2014-02-18 — End: 2014-02-20
  Administered 2014-02-18: 15 mL via OROMUCOSAL

## 2014-02-18 MED ORDER — CHLORHEXIDINE GLUCONATE 0.12 % MT SOLN
15.0000 mL | Freq: Two times a day (BID) | OROMUCOSAL | Status: DC
  Administered 2014-02-18 – 2014-02-19 (×3): 15 mL via OROMUCOSAL
  Filled 2014-02-18 (×4): qty 15

## 2014-02-18 MED ORDER — BOOST / RESOURCE BREEZE PO LIQD
1.0000 | Freq: Two times a day (BID) | ORAL | Status: DC
  Administered 2014-02-18 – 2014-02-19 (×2): 1 via ORAL

## 2014-02-18 NOTE — Progress Notes (Signed)
Family Medicine Teaching Service Daily Progress Note Intern Pager: [phone]  Patient name: Gina Wise Medical record number: [id number] Date of birth: 07-27-1961 Age: 53 y.o. Gender: female  Primary Care Provider: Liam Graham, MD Consultants: none Code Status: full  Pt Overview and Major Events to Date:  4/29: N/V/D x 1 week; VSS, Labs unimpressive thus far; Checking C diff  Assessment and Plan: Gina Wise is a 53 y.o. female presenting with N/V/D and Ab pain x 1 wk. PMH is significant for hyperthyroidism, HTN, Migraines, Depression & multiple abdominal surgeries (appendectomy, tubal ligation, hysterectomy, inguinal hernia repair)   # Abdominal Pain: Associated w/ N/V/D  - Currently unknown etiology. VSS; CT ab/pelvis (4/24) and abdominal US (4/27) have been unremarkable. Lipase wnl. CMET wnl. WBC initially elevated on ED visit 4/22 which has resolved  UA reveal small amount of blood, but negative for infection.  - DDx: Viral gastroenteritis (WBC initially elevated, ?prolong course); Cdiff (Recent Zpack); Partial SBO( Imaging negative thus far, but multiple abdominal surgeries); Abdominal migaine (Hx migraine but denies one since hysterectomy, Current HA different from previous migraines); Anxiety/Depresssion (denies current changes in mood/stress, but Flat affect); IBD, IBS  - C diff, Giardia Negatvie; Cryptosporium and Stool Culture: pending  - KUB; Normal gas pattern - Repeat CBC, CMET, Lipase all wnl - UDS negative, ESR wnl; HIV: pending - Abx: Flagyl & Cipro (4/29>>)  - Zofran & Phenergan for nausea; Morphine IV for pain  - IVF as below; Thiamine & Folate daily  - Consider GI consult if not improving  - Advance diet to clears today; will transition meds to PO once able to take solid foods  # Hypothyroidism:  - TSH: 1.17 (4/29 - Synthroid IV while ongoing n/v   # HTN:  - Holding HCTZ - Soft BP w/ N/V - continue to monitor   # Depression  - Continue Zoloft   FEN/GI: D5  NS @ 50 cc/hr; Clears; advance as tolerated Prophylaxis: Heparin Subq   Disposition: Discharge pending GI work-up and improved PO intake with decreased N/V/D  Subjective:  Nausea/Vomiting/Diarrhea abdominal pain all improved today. Agree with advancing diet slowly.   Objective: Temp:  [97.5 F (36.4 C)-98 F (36.7 C)] 97.5 F (36.4 C) (04/30 0416) Pulse Rate:  [68-84] 68 (04/30 0416) Resp:  [16-18] 16 (04/30 0416) BP: (111-141)/(61-95) 111/61 mmHg (04/30 0416) SpO2:  [94 %-100 %] 94 % (04/30 0416) Weight:  [175 lb 11.3 oz (79.7 kg)] 175 lb 11.3 oz (79.7 kg) (04/29 1630) Physical Exam: General: Female; NAD; Flat affect  HEENT: MMM  Cardiovascular: RRR no m/r/g  Respiratory: CTAB; normal resp effort on RA  Abdomen: Soft, mild diffuse tenderness; No BS appreciated; No rebounding or guarding  Extremities: WWP; No LE edema  Skin: No rash  Laboratory:  Recent Labs Lab 02/12/14 1631 02/15/14 1140 02/17/14 2030  WBC 12.3* 8.4 9.7  HGB 15.3* 16.1* 15.3*  HCT 44.3 47.1* 44.8  PLT 327 368 293    Recent Labs Lab 02/12/14 1631 02/15/14 1140 02/17/14 2030  NA 141 142 144  K 3.2* 3.8 3.7  CL 102 102 106  CO2 _0 BUN _1 CREATININE 0.77 0.80 0.64  CALCIUM 9.2 9.9 8.9  PROT 7.6 8.1 7.3  BILITOT 0.4 0.4 0.5  ALKPHOS 85 94 82  ALT _2 AST _3 GLUCOSE 97 90 94   Lipase: wnl UDS: neg TSH: wnl Imaging/Diagnostic Tests: Dg Abd 1 View  02/10/2014 FINDINGS: The  bowel gas pattern is normal. No radio-opaque calculi or other significant radiographic abnormality are seen. IMPRESSION: Negative.   Ct Abdomen Pelvis W Contrast  02/12/2014 FINDINGS: Dependent atelectasis is seen in the lung bases. No pleural or pericardial effusion. There is minimal intrahepatic biliary ductal prominence. The common bile duct, liver, spleen, adrenal glands and pancreas are unremarkable. Tiny low attenuating lesions in the left kidneys cannot be definitively characterized but  likely represent cysts. The stomach and small and large bowel appear normal. The appendix is not visualized and may have been removed. No evidence of inflammatory process is seen. There is no lymphadenopathy or fluid. No focal bony abnormality is seen. IMPRESSION: No acute finding abdomen or pelvis. No finding to explain the patient's symptoms.  US Abdomen Limited Ruq   02/15/2014 FINDINGS: Gallbladder: Normally distended without stones or wall thickening. No pericholecystic fluid or sonographic Murphy sign. Common bile duct: Diameter: Normal caliber 4 mm diameter Liver: Normal appearance No RIGHT upper quadrant ascites IMPRESSION: Negative RIGHT upper quadrant ultrasound.   KUB 02/17/14 FINDINGS:   Visualized bowel gas pattern is within normal limits without  evidence of obstruction or ileus. No definite free intraperitoneal  air. No soft tissue mass or abnormal calcification.  Degenerative changes noted within the lower lumbar spine. No acute  osseous abnormality.   IMPRESSION:  Nonobstructive bowel gas pattern with no radiographic evidence of  acute intra-abdominal process identified  Phill Myron, MD 02/18/2014, 7:51 AM PGY-1, [place] Intern pager: [phone], text pages welcome

## 2014-02-18 NOTE — Progress Notes (Signed)
FMTS ATTENDING  NOTE Gina Suto,MD I  have seen and examined this patient, reviewed their chart. I have discussed this patient with the resident. I agree with the resident's findings, assessment and care plan.  53 Y/O F with PMX of HTN, Depression,Migraine, sent to the hospital by her PCP for persistent N/V and loose stool which she stated has been going on for about 1 week. This started after she eat pizza, immediately she developed stomach upset and since then been stooling. She has not had anymore loose stool since last night, she is currently NPO hence bowel rested with no vomiting as well.  Filed Vitals:   02/17/14 1630 02/17/14 2103 02/18/14 0416  BP: 141/78 124/62 111/61  Pulse: 84 72 68  Temp: 97.8 F (36.6 C) 98 F (36.7 C) 97.5 F (36.4 C)  TempSrc: Oral Oral Oral  Resp: 18 18 16   Height: 5\' 6"  (1.676 m)    Weight: 175 lb 11.3 oz (79.7 kg)    SpO2: 100% 96% 94%   Exam: Gen: Not in distress,not dehydrated. Resp: Air entry equal and clear B/L. CV: S1 S2 normal,no murmur. Abdmen: Soft, NT/ND, BS normoactive. Ext: No edema.  A/P; Gastroenteritis: Nausea,vomting, diarrhea and abdominal pain.       Symptom resolving.       ?? Viral illness vs IBS.       Stool C.diff negative,stool culture negative.       IV hydration and pain control.       Advance diet as tolerated.      If recurrent we will consider treating as IBS with Bentyl      Monitor vital signs.

## 2014-02-18 NOTE — H&P (Signed)
FMTS Attending Note  I personally saw and evaluated the patient. The plan of care was discussed with the resident team. I agree with the assessment and plan as documented by the resident.   Patient seen in office on 02/17/14. Please refer to the office visit note.  Dossie Arbour MD

## 2014-02-18 NOTE — Progress Notes (Signed)
UR Completed.  Dorianna Mckiver Jane Syncere Eble [phone] 02/18/2014  

## 2014-02-18 NOTE — Progress Notes (Signed)
INITIAL NUTRITION ASSESSMENT  DOCUMENTATION CODES Per approved criteria  -Not Applicable   INTERVENTION: Provide Resource breeze BID Diet advancement per MD discretion RD to continue to monitor  NUTRITION DIAGNOSIS: Inadequate oral intake related to nausea and vomiting as evidenced by 4% weight loss in less than 3 weeks.  Goal: Pt to meet >/= 90% of their estimated nutrition needs   Monitor:  Diet advancement, PO intake/tolerance, weight trend, labs  Reason for Assessment: Malnutrition Screening Tool  53 y.o. female  Admitting Dx: Intractable nausea and vomiting  ASSESSMENT: 53 Y/O F with PMX of HTN, Depression,Migraine, sent to the hospital by her PCP for persistent N/V and loose stool which she stated has been going on for about 1 week.   Pt reports inability to keep any food or drink down for the past week. She states she weighed 184 lbs one week ago. Per weight history pt weighed 174 lbs one week ago and 182 lbs 3 weeks ago (4% weight loss in less than 3 weeks). Pt states she feels better today; she still has some abdominal pain but, no further nausea or vomiting today.     Nutrition Focused Physical Exam:  Subcutaneous Fat:  Orbital Region: wnl Upper Arm Region: mild wasting Thoracic and Lumbar Region: NA  Muscle:  Temple Region: mild wasting Clavicle Bone Region: mild wasting Clavicle and Acromion Bone Region: wnl Scapular Bone Region: wnl Dorsal Hand: wnl Patellar Region: wnl Anterior Thigh Region: wnl Posterior Calf Region: wnl  Edema: none  Height: Ht Readings from Last 1 Encounters:  02/17/14 5\' 6"  (1.676 m)    Weight: Wt Readings from Last 1 Encounters:  02/17/14 175 lb 11.3 oz (79.7 kg)    Ideal Body Weight: 130 lbs  % Ideal Body Weight: 135%  Wt Readings from Last 10 Encounters:  02/17/14 175 lb 11.3 oz (79.7 kg)  02/15/14 178 lb (80.74 kg)  02/10/14 174 lb (78.926 kg)  02/02/14 187 lb (84.823 kg)  01/29/14 182 lb (82.555 kg)   08/25/13 182 lb (82.555 kg)  08/19/13 182 lb 14.4 oz (82.963 kg)  07/03/13 178 lb (80.74 kg)  06/15/13 174 lb (78.926 kg)  06/11/13 171 lb (77.565 kg)    Usual Body Weight: 184 lbs  % Usual Body Weight: 95%  BMI:  Body mass index is 28.37 kg/(m^2).  Estimated Nutritional Needs: Kcal: 1800-2000 Protein: 80-90 grams Fluid: 2 L/day  Skin: intact  Diet Order: Full Liquid  EDUCATION NEEDS: -No education needs identified at this time   Intake/Output Summary (Last 24 hours) at 02/18/14 1318 Last data filed at 02/18/14 1105  Gross per 24 hour  Intake 1935.33 ml  Output   1050 ml  Net 885.33 ml    Last BM: 4/28   Labs:   Recent Labs Lab 02/12/14 1631 02/15/14 1140 02/17/14 2030  NA 141 142 144  K 3.2* 3.8 3.7  CL 102 102 106  CO2 22 22 22   BUN 15 11 8   CREATININE 0.77 0.80 0.64  CALCIUM 9.2 9.9 8.9  GLUCOSE 97 90 94    CBG (last 3)  No results found for this basename: GLUCAP,  in the last 72 hours  Scheduled Meds: . antiseptic oral rinse  15 mL Mouth Rinse q12n4p  . chlorhexidine  15 mL Mouth Rinse BID  . ciprofloxacin  400 mg Intravenous Q12H  . folic acid  1 mg Intravenous Daily  . heparin  5,000 Units Subcutaneous 3 times per day  . levothyroxine  44 mcg  Intravenous Q24H  . metronidazole  500 mg Intravenous Q12H  . ondansetron  4 mg Intravenous Q6H  . sertraline  50 mg Oral Daily  . thiamine  100 mg Intravenous Daily    Continuous Infusions: . dextrose 5 % and 0.9% NaCl 50 mL/hr at 02/18/14 1104    Past Medical History  Diagnosis Date  . Hypothyroid   . Overweight   . Hypertension   . Peri-menopause   . Anemia   . High cholesterol   . Chronic bronchitis     "get it q yr; in the spring time" (02/17/2014)  . Multiple environmental allergies   . Migraines     "maybe a couple/yr" (02/17/2014)  . Depression     "have taken zoloft since 1996" (02/17/2014)    Past Surgical History  Procedure Laterality Date  . Shoulder open rotator cuff  repair Left 1995  . Shoulder surgery Left 1997    "cartilage"  . Exploratory laparotomy  1987  . Inguinal hernia repair Right ~ 2003  . Appendectomy  1987  . Tubal ligation  1998    laparoscopic  . Vaginal hysterectomy  04/08/2012    menorrhagia  . Tonsillectomy and adenoidectomy  1976  . Foot surgery Right ~1980    "don't know what they did; had to take something out"    Pryor Ochoa RD, LDN Inpatient Clinical Dietitian Pager: [phone] After Hours Pager: [phone]

## 2014-02-19 LAB — CBC
HCT: 43.8 % (ref 36.0–46.0)
Hemoglobin: 14.5 g/dL (ref 12.0–15.0)
MCH: 29.1 pg (ref 26.0–34.0)
MCHC: 33.1 g/dL (ref 30.0–36.0)
MCV: 88 fL (ref 78.0–100.0)
Platelets: 279 10*3/uL (ref 150–400)
RBC: 4.98 MIL/uL (ref 3.87–5.11)
RDW: 13.7 % (ref 11.5–15.5)
WBC: 6.9 10*3/uL (ref 4.0–10.5)

## 2014-02-19 LAB — BASIC METABOLIC PANEL
BUN: 5 mg/dL — ABNORMAL LOW (ref 6–23)
CALCIUM: 9.1 mg/dL (ref 8.4–10.5)
CO2: 25 mEq/L (ref 19–32)
CREATININE: 0.82 mg/dL (ref 0.50–1.10)
Chloride: 105 mEq/L (ref 96–112)
GFR calc Af Amer: >90 mL/min (ref >90)
GFR calc non Af Amer: 81 mL/min — ABNORMAL LOW (ref >90)
Glucose, Bld: 94 mg/dL (ref 70–99)
Potassium: 3.6 mEq/L — ABNORMAL LOW (ref 3.7–5.3)
Sodium: 143 mEq/L (ref 137–147)

## 2014-02-19 MED ORDER — CIPROFLOXACIN HCL 500 MG PO TABS
500.0000 mg | ORAL_TABLET | Freq: Two times a day (BID) | ORAL | Status: DC
  Administered 2014-02-20: 500 mg via ORAL
  Filled 2014-02-19 (×3): qty 1

## 2014-02-19 MED ORDER — LEVOTHYROXINE SODIUM 88 MCG PO TABS
88.0000 ug | ORAL_TABLET | Freq: Every day | ORAL | Status: DC
  Administered 2014-02-20: 88 ug via ORAL
  Filled 2014-02-19 (×2): qty 1

## 2014-02-19 MED ORDER — ONDANSETRON HCL 4 MG PO TABS: 4.0000 mg | ORAL_TABLET | Freq: Three times a day (TID) | ORAL | Status: DC | PRN

## 2014-02-19 NOTE — Progress Notes (Signed)
Family Medicine Teaching Service Daily Progress Note Intern Pager: [phone]  Patient name: Gina Wise Medical record number: [id number] Date of birth: 08/29/1961 Age: 53 y.o. Gender: female  Primary Care Provider: Liam Graham, MD Consultants: none Code Status: full  Pt Overview and Major Events to Date:  4/29: N/V/D x 1 week; VSS, Labs unimpressive thus far; Checking C diff 4/30: Failed advance diet 5/1: Tolerated full liquids > advancing to bland diet  Assessment and Plan: Gina Wise is a 53 y.o. female presenting with N/V/D and Ab pain x 1 wk. PMH is significant for hyperthyroidism, HTN, Migraines, Depression & multiple abdominal surgeries (appendectomy, tubal ligation, hysterectomy, inguinal hernia repair)   # Abdominal Pain: Associated w/ N/V/D  - Currently unknown etiology. VSS; CT ab/pelvis (4/24) and abdominal US (4/27) have been unremarkable. Lipase wnl. CMET wnl. WBC initially elevated on ED visit 4/22 which has resolved  UA reveal small amount of blood, but negative for infection.  - DDx: Viral gastroenteritis (WBC initially elevated, ?prolong course); Cdiff (Recent Zpack); Partial SBO( Imaging negative thus far, but multiple abdominal surgeries); Abdominal migaine (Hx migraine but denies one since hysterectomy, Current HA different from previous migraines); Anxiety/Depresssion (denies current changes in mood/stress, but flat affect); IBD, IBS  - C diff, Giardia Negatvie; Cryptosporium and Stool Culture: negative - KUB; Normal gas pattern - Repeat CBC, CMET, Lipase all wnl - UDS negative, ESR wnl; HIV: negative - Abx: Flagyl & Cipro (4/29>>)  - Zofran & Phenergan for nausea; Morphine IV for pain  - IVF as below; Thiamine & Folate daily  - Consider GI consult if not improving  - Advance diet to bland diet given tolerance of full liquids at breakfast; will transition meds to PO this PM if able to take solid foods  # Hypothyroidism:  - TSH: 1.17 (4/29) - Synthroid IV  while ongoing n/v   # HTN:  - Holding HCTZ - Soft BP w/ N/V - continue to monitor   # Depression  - Continue Zoloft   FEN/GI: D5 NS @ 50 cc/hr; full liquids advancing to bland Prophylaxis: Heparin Subq   Disposition: Discharge pending GI work-up and improved PO intake with decreased N/V/D  Subjective:  Some nausea earlier this morning without emesis. Diarrhea 15-20 minutes following advance to full liquids yesterday.   Objective: Temp:  [97.4 F (36.3 C)-97.9 F (36.6 C)] 97.4 F (36.3 C) (05/01 0609) Pulse Rate:  [63-82] 63 (05/01 0609) Resp:  [16] 16 (05/01 0609) BP: (103-135)/(64-74) 135/74 mmHg (05/01 0609) SpO2:  [94 %-98 %] 97 % (05/01 0609) Physical Exam: General: Tired-appearing female in NAD HEENT: MMM  Cardiovascular: RRR no m/r/g  Respiratory: CTAB; normal resp effort on RA  Abdomen: Soft, mild epigastric tenderness to deep palpation; Hypoactive bowel sounds; No rebound or guarding  Extremities: WWP; No LE edema  Skin: No rash  Laboratory:  Recent Labs Lab 02/15/14 1140 02/17/14 2030 02/19/14 0638  WBC 8.4 9.7 6.9  HGB 16.1* 15.3* 14.5  HCT 47.1* 44.8 43.8  PLT 368 293 279    Recent Labs Lab 02/12/14 1631 02/15/14 1140 02/17/14 2030 02/19/14 0638  NA 141 142 144 143  K 3.2* 3.8 3.7 3.6*  CL 102 102 106 105  CO2 '22 22 22 25  ' BUN '15 11 8 ' 5*  CREATININE 0.77 0.80 0.64 0.82  CALCIUM 9.2 9.9 8.9 9.1  PROT 7.6 8.1 7.3  --   BILITOT 0.4 0.4 0.5  --   ALKPHOS 85 94 82  --   ALT  '27 25 22  ' --   AST '26 19 21  ' --   GLUCOSE 97 90 94 94   Lipase: wnl UDS: neg TSH: wnl  Imaging/Diagnostic Tests: Dg Abd 1 View  02/10/2014 FINDINGS: The bowel gas pattern is normal. No radio-opaque calculi or other significant radiographic abnormality are seen. IMPRESSION: Negative.   Ct Abdomen Pelvis W Contrast  02/12/2014 FINDINGS: Dependent atelectasis is seen in the lung bases. No pleural or pericardial effusion. There is minimal intrahepatic biliary ductal  prominence. The common bile duct, liver, spleen, adrenal glands and pancreas are unremarkable. Tiny low attenuating lesions in the left kidneys cannot be definitively characterized but likely represent cysts. The stomach and small and large bowel appear normal. The appendix is not visualized and may have been removed. No evidence of inflammatory process is seen. There is no lymphadenopathy or fluid. No focal bony abnormality is seen. IMPRESSION: No acute finding abdomen or pelvis. No finding to explain the patient's symptoms.  US Abdomen Limited Ruq   02/15/2014 FINDINGS: Gallbladder: Normally distended without stones or wall thickening. No pericholecystic fluid or sonographic Murphy sign. Common bile duct: Diameter: Normal caliber 4 mm diameter Liver: Normal appearance No RIGHT upper quadrant ascites IMPRESSION: Negative RIGHT upper quadrant ultrasound.   KUB 02/17/14 FINDINGS:   Visualized bowel gas pattern is within normal limits without  evidence of obstruction or ileus. No definite free intraperitoneal  air. No soft tissue mass or abnormal calcification.  Degenerative changes noted within the lower lumbar spine. No acute  osseous abnormality.   IMPRESSION:  Nonobstructive bowel gas pattern with no radiographic evidence of  acute intra-abdominal process identified  Vance Gather, MD 02/19/2014, 9:38 AM PGY-1, [facility] Intern pager: [phone], text pages welcome

## 2014-02-19 NOTE — Progress Notes (Signed)
Patient's IV was infiltrated,md notified.

## 2014-02-19 NOTE — Progress Notes (Signed)
FMTS ATTENDING  NOTE Bailey Kolbe,MD I  have seen and examined this patient, reviewed their chart. I have discussed this patient with the resident. I agree with the resident's findings, assessment and care plan. 

## 2014-02-19 NOTE — Discharge Instructions (Signed)
Gina Wise, you were admitted with nausea, vomiting and diarrhea. An extensive work up did not reveal any worrisome reasons for these symptoms, and now that they are resolving you are ready for discharge.  - Continue to stay well hydrated, drinking plenty of fluids.  - You can take zofran as needed for nausea. This has been sent to your pharmacy.  - You have a follow up appointment on Tuesday next week.   - Call the clinic or be assessed by a medical provider if you worsen and become unable to tolerate fluids.

## 2014-02-19 NOTE — Discharge Summary (Signed)
[facility] Discharge Summary  Patient name: Gina Wise Medical record number: [id number] Date of birth: May 08, 1961 Age: 53 y.o. Gender: female Date of Admission: 02/17/2014  Date of Discharge: 02/20/2014 Admitting Physician: Andrena Mews, MD  Primary Care Provider: Liam Graham, MD Consultants: None  Indication for Hospitalization: Inability to take po  Discharge Diagnoses/Problem List:  Patient Active Problem List   Diagnosis Date Noted  . Intractable nausea and vomiting 02/17/2014  . Abdominal pain, right upper quadrant 02/15/2014  . Goiter 07/03/2013  . Cervical lymphadenopathy 06/15/2013  . Depression with anxiety 06/15/2013  . Cough 01/02/2013  . Headache 10/24/2012  . Iron deficiency 03/13/2012  . Fatigue 03/12/2012  . Dysfunctional uterine bleeding 01/02/2012  . Otitis externa 12/30/2011  . Insomnia 10/03/2011  . Dyslipidemia 09/03/2011  . Neck pain, chronic 09/03/2011  . Overweight 01/03/2011  . 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 MIGRAINE, CHRONIC 10/04/2009  . Unspecified hypothyroidism 09/09/2009  . Essential hypertension, benign 09/09/2009   Disposition: Discharge home  Discharge Condition: Improved  Brief Hospital Course:  Gina Wise is a 53 y.o. female presenting with N/V/D and Ab pain x 1 wk. PMH is significant for hyperthyroidism, HTN, Migraines, Depression & multiple abdominal surgeries (appendectomy, tubal ligation, hysterectomy, inguinal hernia repair).  Extensive work up for etiology revealed no abnormalities and stool culture remained without suspicious colonies. CT ab/pelvis (4/24) and abdominal US (4/27) unremarkable. Cmet and lipase within normal limits. WBC initially elevated on ED visit 4/22 which has resolved. U/A without evidence of UTI. C diff negative, though recently completed a Z pack. UDS negative. TSH within normal limits. She was started on IV medications including cipro and flagyl  to cover gut bacteria. Diet was advanced slowly as tolerated. Antibiotics were discontinued on discharge as stool cultures were negative and she was suspected to have viral gastroenteritis.  Issues for Follow Up:  - Monitor for hydration status - Consider GI outpatient referral  Significant Procedures: None  Significant Labs and Imaging:   Recent Labs Lab 02/17/14 2030 02/19/14 0638 02/20/14 0600  WBC 9.7 6.9 7.3  HGB 15.3* 14.5 14.8  HCT 44.8 43.8 44.5  PLT 293 279 265    Recent Labs Lab 02/17/14 2030 02/19/14 0638 02/20/14 0600  NA 144 143 142  K 3.7 3.6* 4.0  CL 106 105 104  CO2 _0 GLUCOSE 94 94 90  BUN 8 5* 7  CREATININE 0.64 0.82 0.78  CALCIUM 8.9 9.1 9.1  ALKPHOS 82  --   --   AST 21  --   --   ALT 22  --   --   ALBUMIN 3.8  --   --    Stool culture: negative for salmonella, shigella, campylobacter, yersinia, e. Coli 0157:H7 Giardia screen negative HIV antibody non-reactive UDS negative ESR 5  Results/Tests Pending at Time of Discharge: none  Discharge Medications:    Medication List    STOP taking these medications       ciprofloxacin 500 MG tablet  Commonly known as:  CIPRO     metroNIDAZOLE 500 MG tablet  Commonly known as:  FLAGYL     promethazine 25 MG tablet  Commonly known as:  PHENERGAN      TAKE these medications       fluticasone 50 MCG/ACT nasal spray  Commonly known as:  FLONASE  Place 1 spray into both nostrils daily.     hydrochlorothiazide 25 MG tablet  Commonly known as:  HYDRODIURIL  Take 0.5 tablets (12.5 mg total) by mouth daily.     levothyroxine 88 MCG tablet  Commonly known as:  SYNTHROID, LEVOTHROID  Take 1 tablet (88 mcg total) by mouth daily before breakfast.     ondansetron 4 MG disintegrating tablet  Commonly known as:  ZOFRAN-ODT  Take 4 mg by mouth every 4 (four) hours as needed for nausea or vomiting.     oxyCODONE-acetaminophen 5-325 MG per tablet  Commonly known as:  PERCOCET  Take 1-2  tablets by mouth every 6 (six) hours as needed.     sertraline 100 MG tablet  Commonly known as:  ZOLOFT  Take 50 mg by mouth daily.     SUMAtriptan 100 MG tablet  Commonly known as:  IMITREX  Take 100 mg by mouth every 2 (two) hours as needed for migraine.        Discharge Instructions: Please refer to Patient Instructions section of EMR for full details.  Patient was counseled important signs and symptoms that should prompt return to medical care, changes in medications, dietary instructions, activity restrictions, and follow up appointments.   Follow-Up Appointments: Follow-up Information   Follow up with Vance Gather, MD On 02/23/2014. (10:30am)    Specialty:  Family Medicine   Contact information:   [place] [address] [phone]       Tawanna Sat, MD 02/22/2014, 3:33 PM PGY-1, [place]

## 2014-02-20 LAB — BASIC METABOLIC PANEL
BUN: 7 mg/dL (ref 6–23)
CO2: 22 mEq/L (ref 19–32)
CREATININE: 0.78 mg/dL (ref 0.50–1.10)
Calcium: 9.1 mg/dL (ref 8.4–10.5)
Chloride: 104 mEq/L (ref 96–112)
Glucose, Bld: 90 mg/dL (ref 70–99)
Potassium: 4 mEq/L (ref 3.7–5.3)
Sodium: 142 mEq/L (ref 137–147)

## 2014-02-20 LAB — CBC
HCT: 44.5 % (ref 36.0–46.0)
Hemoglobin: 14.8 g/dL (ref 12.0–15.0)
MCH: 29.1 pg (ref 26.0–34.0)
MCHC: 33.3 g/dL (ref 30.0–36.0)
MCV: 87.6 fL (ref 78.0–100.0)
Platelets: 265 10*3/uL (ref 150–400)
RBC: 5.08 MIL/uL (ref 3.87–5.11)
RDW: 13.7 % (ref 11.5–15.5)
WBC: 7.3 10*3/uL (ref 4.0–10.5)

## 2014-02-20 NOTE — Progress Notes (Signed)
Discharge instructions gone over with patient. Home medications gone over. Prescription for zofran was faxed to pharmacy. Diet, and signs and symptoms of worsening condition discussed. Patient verbalized understanding of  Instructions.

## 2014-02-20 NOTE — Progress Notes (Signed)
Family Medicine Teaching Service Daily Progress Note Intern Pager: [phone]  Patient name: Gina Wise Medical record number: [id number] Date of birth: 21-Jan-1961 Age: 53 y.o. Gender: female  Primary Care Provider: Liam Graham, MD Consultants: none Code Status: full  Pt Overview and Major Events to Date:  4/29: N/V/D x 1 week; VSS, Labs unimpressive thus far. 4/30: Failed advance diet 5/1: Tolerated full liquids > advancing to bland diet  Assessment and Plan: Gina Wise is a 53 y.o. female presenting with N/V/D and Ab pain x 1 wk. PMH is significant for hyperthyroidism, HTN, Migraines, Depression & multiple abdominal surgeries (appendectomy, tubal ligation, hysterectomy, inguinal hernia repair)   # Abdominal Pain: Associated w/ N/V/D  - Currently unknown etiology. VSS; CT ab/pelvis (4/24) and abdominal US (4/27) have been unremarkable. Lipase wnl. CMET wnl. WBC initially elevated on ED visit 4/22 which has resolved  UA reveal small amount of blood, but negative for infection.  - DDx: Viral gastroenteritis (WBC initially elevated, ?prolong course); Cdiff (Recent Zpack); Partial SBO( Imaging negative thus far, but multiple abdominal surgeries); Abdominal migaine (Hx migraine but denies one since hysterectomy, Current HA different from previous migraines); Anxiety/Depresssion (denies current changes in mood/stress, but flat affect); IBD, IBS  - C diff, Giardia Negatvie; Cryptosporium and Stool Culture: negative - KUB; Normal gas pattern - Repeat CBC, CMET, Lipase all wnl - UDS negative, ESR wnl; HIV: negative - Abx: Flagyl & Cipro (4/29>> d/cd' 5/2 without a clear bacterial etiology of her symptoms.  - Zofran & Phenergan for nausea - IVF as below; Thiamine & Folate daily  - may need GI consult as outpatient.  # Hypothyroidism:  - TSH: 1.17 (4/29) - Synthroid IV while ongoing n/v   # HTN:  - Holding HCTZ - Soft BP w/ N/V - continue to monitor   # Depression  - Continue Zoloft    FEN/GI: D5 NS @ 50 cc/hr; full liquids advancing to bland Prophylaxis: Heparin Subq   Disposition: Discharge home today.  Subjective:  No nausea, diarrhea or abdominal pain. Was able to tolerate regular diet.   Objective: Temp:  [97.5 F (36.4 C)-98.1 F (36.7 C)] 97.5 F (36.4 C) (05/02 0502) Pulse Rate:  [69-86] 86 (05/02 0502) Resp:  [17-18] 17 (05/02 0502) BP: (110-128)/(59-68) 110/62 mmHg (05/02 0502) SpO2:  [93 %-100 %] 97 % (05/02 0502) Physical Exam: General: Tired-appearing female in NAD HEENT: MMM  Cardiovascular: RRR no m/r/g  Respiratory: CTAB; normal resp effort on RA  Abdomen: Soft, mild epigastric tenderness to deep palpation; Hypoactive bowel sounds; No rebound or guarding  Extremities: WWP; No LE edema  Skin: No rash  Laboratory:  Recent Labs Lab 02/17/14 2030 02/19/14 0638 02/20/14 0600  WBC 9.7 6.9 7.3  HGB 15.3* 14.5 14.8  HCT 44.8 43.8 44.5  PLT 293 279 265    Recent Labs Lab 02/15/14 1140 02/17/14 2030 02/19/14 0638 02/20/14 0600  NA 142 144 143 142  K 3.8 3.7 3.6* 4.0  CL 102 106 105 104  CO2 '22 22 25 22  ' BUN 11 8 5* 7  CREATININE 0.80 0.64 0.82 0.78  CALCIUM 9.9 8.9 9.1 9.1  PROT 8.1 7.3  --   --   BILITOT 0.4 0.5  --   --   ALKPHOS 94 82  --   --   ALT 25 22  --   --   AST 19 21  --   --   GLUCOSE 90 94 94 90   Lipase: wnl UDS: neg TSH:  wnl  Imaging/Diagnostic Tests: Dg Abd 1 View  02/10/2014 FINDINGS: The bowel gas pattern is normal. No radio-opaque calculi or other significant radiographic abnormality are seen. IMPRESSION: Negative.   Ct Abdomen Pelvis W Contrast  02/12/2014 FINDINGS: Dependent atelectasis is seen in the lung bases. No pleural or pericardial effusion. There is minimal intrahepatic biliary ductal prominence. The common bile duct, liver, spleen, adrenal glands and pancreas are unremarkable. Tiny low attenuating lesions in the left kidneys cannot be definitively characterized but likely represent cysts. The  stomach and small and large bowel appear normal. The appendix is not visualized and may have been removed. No evidence of inflammatory process is seen. There is no lymphadenopathy or fluid. No focal bony abnormality is seen. IMPRESSION: No acute finding abdomen or pelvis. No finding to explain the patient's symptoms.  US Abdomen Limited Ruq   02/15/2014 FINDINGS: Gallbladder: Normally distended without stones or wall thickening. No pericholecystic fluid or sonographic Murphy sign. Common bile duct: Diameter: Normal caliber 4 mm diameter Liver: Normal appearance No RIGHT upper quadrant ascites IMPRESSION: Negative RIGHT upper quadrant ultrasound.   KUB 02/17/14 FINDINGS:   Visualized bowel gas pattern is within normal limits without evidence of obstruction or ileus. No definite free intraperitoneal air. No soft tissue mass or abnormal calcification. Degenerative changes noted within the lower lumbar spine. No acute osseous abnormality.  IMPRESSION:  Nonobstructive bowel gas pattern with no radiographic evidence of acute intra-abdominal process identified  [place], MD 02/20/2014, 9:08 AM PGY-3, [place] Intern pager: [phone], text pages welcome

## 2014-02-20 NOTE — Progress Notes (Signed)
FMTS Attending Note  I personally saw and evaluated the patient. The plan of care was discussed with the resident team. I agree with the assessment and plan as documented by the resident.   Patient clinically improved, pain controlled, no further diarrhea or emesis, tolerating diet and nausea controlled. Stable for discharge.  Dossie Arbour MD

## 2014-02-21 LAB — STOOL CULTURE

## 2014-02-22 NOTE — Discharge Summary (Signed)
I agree with the discharge summary as documented.   Kyle Fletke MD  

## 2014-02-23 ENCOUNTER — Ambulatory Visit (INDEPENDENT_AMBULATORY_CARE_PROVIDER_SITE_OTHER): Payer: 59 | Admitting: Family Medicine

## 2014-02-23 ENCOUNTER — Encounter: Payer: Self-pay | Admitting: Family Medicine

## 2014-02-23 VITALS — BP 147/87 | HR 91 | Temp 97.8°F | Ht 66.0 in | Wt 179.8 lb

## 2014-02-23 DIAGNOSIS — K219 Gastro-esophageal reflux disease without esophagitis: Secondary | ICD-10-CM

## 2014-02-23 DIAGNOSIS — K5900 Constipation, unspecified: Secondary | ICD-10-CM | POA: Insufficient documentation

## 2014-02-23 MED ORDER — OMEPRAZOLE 40 MG PO CPDR: 40.0000 mg | DELAYED_RELEASE_CAPSULE | Freq: Every day | ORAL | Status: DC

## 2014-02-23 MED ORDER — BISACODYL 10 MG RE SUPP: 10.0000 mg | RECTAL | Status: DC | PRN

## 2014-02-23 MED ORDER — POLYETHYLENE GLYCOL 3350 17 GM/SCOOP PO POWD: 17.0000 g | Freq: Every day | ORAL | Status: DC

## 2014-02-23 NOTE — Progress Notes (Signed)
Forms placed in Dr.Fletke's box for completion.   Gina Wise

## 2014-02-23 NOTE — Progress Notes (Signed)
Patient ID: Gina Wise, female   DOB: 10/06/1961, 53 y.o.   MRN: [id number]   Subjective:  HPI:   Gina Wise is a 52 y.o. female with a history of hypothyroidism, HTN, HLD, depression and anxiety here for hospital follow up.   She was recently discharged with presumed viral gastroenteritis. No antibiotics were prescribed at discharge. She reports resolution of vomiting and diarrhea. Still has intermittent nausea with epigastric pressure relieved by upright posture, exacerbated by laying supine after eating. She is constipated. Hasn't had a BM x4 days. She has been eating and feels like she has a stool burden.   Review of Systems:  Per HPI. All other systems reviewed and are negative.    PMH reviewed.   Medications: reviewed and updated  Objective:  Physical Exam: BP 147/87  Pulse 91  Temp(Src) 97.8 F (36.6 C) (Oral)  Ht 5\' 6"  (1.676 m)  Wt 179 lb 12.8 oz (81.557 kg)  BMI 29.03 kg/m2  LMP 04/03/2012  Gen: Tired 53 y.o. female in NAD HEENT: MMM, EOMI, PERRL, anicteric sclerae CV: RRR, no MRG, no JVD Resp: Non-labored, CTAB, no wheezes noted Abd: Soft, mildly distended, mild epigastric tenderness, normoactive BS without high pitched noises, no guarding or organomegaly MSK: No edema noted, full ROM Neuro: Alert and oriented, speech normal    Assessment:     Gina Wise is a 53 y.o. female here for hospital follow up.     Plan:     See problem list for problem-specific plans.

## 2014-02-23 NOTE — Patient Instructions (Signed)
Thank you for coming in today!  - Start taking miralax once a day until you achieve 1 normal BM every day.  - Start taking dulcolax suppository once a day until you have 2 normal stools.   As you leave, make an appointment to follow up with your PCP in 1 month.   Take care and seek immediate care sooner if you develop any concerns.  Please feel free to call with any questions or concerns at any time, at [phone]. - Dr. Bonner Puna

## 2014-02-23 NOTE — Progress Notes (Signed)
Left voice message informing pt that FMLA paper work was faxed to [employer] [phone] and original paper work would be placed up front for pick up.  Paper work copied for scanning.  Derl Barrow, RN

## 2014-02-23 NOTE — Assessment & Plan Note (Signed)
Normal recent TSH and imaging. Expect due to recent percocet use and decreased po intake. Rx: miralax and dulcolax suppository until BM is produced, then miralax daily prn normal BM. Also recommended yogurt for gut bacterial flora repletion.

## 2014-02-23 NOTE — Progress Notes (Signed)
FMLA forms dropped off to be filled out.  Dr. Ree Kida has been seeing the patient and has agreed to fill them out.  Please call her when completed.

## 2014-02-23 NOTE — Assessment & Plan Note (Signed)
Rx PPI. If no improvement, could consider H. pylori test and treat, or GI referral for EGD.

## 2014-02-24 ENCOUNTER — Telehealth: Payer: Self-pay | Admitting: Family Medicine

## 2014-02-24 ENCOUNTER — Encounter: Payer: Self-pay | Admitting: Family Medicine

## 2014-02-24 NOTE — Telephone Encounter (Signed)
Pt called because the wrong paper was filled out. DO not fax it. Please shred and call her to discuss. jw

## 2014-02-25 ENCOUNTER — Telehealth: Payer: Self-pay | Admitting: Family Medicine

## 2014-02-25 ENCOUNTER — Ambulatory Visit (INDEPENDENT_AMBULATORY_CARE_PROVIDER_SITE_OTHER): Payer: 59 | Admitting: Family Medicine

## 2014-02-25 ENCOUNTER — Encounter: Payer: Self-pay | Admitting: Family Medicine

## 2014-02-25 VITALS — BP 132/77 | HR 90 | Temp 98.3°F | Wt 180.0 lb

## 2014-02-25 DIAGNOSIS — R112 Nausea with vomiting, unspecified: Secondary | ICD-10-CM

## 2014-02-25 DIAGNOSIS — R1115 Cyclical vomiting syndrome unrelated to migraine: Secondary | ICD-10-CM

## 2014-02-25 NOTE — Telephone Encounter (Signed)
I spoke with Ms. Withington about her symptoms. It seems since our last visit she has taken laxatives and produced 3 BMs without any improvement in her nausea. Nausea is refractory to zofran and phenergan and she has not been able to keep anything down for the past couple days. She is concerned that "something is going on."   After discussion with Dr. Mingo Amber we believe acutely, she must be evaluated today. And going forward, the acute/sub-acute symptoms of nausea, vomiting, postprandial fullness, globus sensation and epigastric/substernal discomfort are concerning for alternative etiologies than GERD, so a GI referral MAY be warranted.   She is calling now to schedule a SDA in the clinic.  The provider caring for her can page me if needed at [phone].  Jan Olano B. Bonner Puna, MD, PGY-1 02/25/2014 12:30 PM

## 2014-02-25 NOTE — Telephone Encounter (Signed)
Pt is no better. She is unable to keep anything down She is trying to change PCP to Bonna Gains he is the only one that knows what is going on

## 2014-02-25 NOTE — Patient Instructions (Signed)
Thank you for coming in,   I think reducing the amount of miralax that you are taking may help. Try to take a 1/4 of a cup daily.   Try to eat small eats throughout the day that are bland in nature and that you are able to stand.   I would like for you to follow up with Dr. Otis Dials in a week so we can determine if our changes are not working    Please feel free to call with any questions or concerns at any time, at [phone]. --Dr. Raeford Razor

## 2014-02-25 NOTE — Telephone Encounter (Signed)
Spoke with patient and let he know that Dr.Fletke filled out forms the day she dropped them off and that Tamika, RN faxed them that day.  Notified patient that the originals will be up front for pick up.  Gina Wise

## 2014-02-27 ENCOUNTER — Encounter: Payer: Self-pay | Admitting: Family Medicine

## 2014-02-27 ENCOUNTER — Telehealth: Payer: Self-pay | Admitting: Family Medicine

## 2014-02-27 ENCOUNTER — Inpatient Hospital Stay (HOSPITAL_COMMUNITY)
Admission: EM | Admit: 2014-02-27 | Discharge: 2014-03-03 | DRG: 391 | Disposition: A | Discharge status: Home / Self Care | Payer: 59 | Attending: Family Medicine | Admitting: Family Medicine

## 2014-02-27 ENCOUNTER — Encounter (HOSPITAL_COMMUNITY): Payer: Self-pay | Admitting: Emergency Medicine

## 2014-02-27 ENCOUNTER — Other Ambulatory Visit: Payer: Self-pay

## 2014-02-27 DIAGNOSIS — G43909 Migraine, unspecified, not intractable, without status migrainosus: Secondary | ICD-10-CM | POA: Diagnosis present

## 2014-02-27 DIAGNOSIS — R51 Headache: Secondary | ICD-10-CM

## 2014-02-27 DIAGNOSIS — K3184 Gastroparesis: Principal | ICD-10-CM | POA: Diagnosis present

## 2014-02-27 DIAGNOSIS — K219 Gastro-esophageal reflux disease without esophagitis: Secondary | ICD-10-CM | POA: Diagnosis present

## 2014-02-27 DIAGNOSIS — F329 Major depressive disorder, single episode, unspecified: Secondary | ICD-10-CM | POA: Diagnosis present

## 2014-02-27 DIAGNOSIS — R112 Nausea with vomiting, unspecified: Secondary | ICD-10-CM | POA: Diagnosis present

## 2014-02-27 DIAGNOSIS — F411 Generalized anxiety disorder: Secondary | ICD-10-CM | POA: Diagnosis present

## 2014-02-27 DIAGNOSIS — F341 Dysthymic disorder: Secondary | ICD-10-CM

## 2014-02-27 DIAGNOSIS — F3289 Other specified depressive episodes: Secondary | ICD-10-CM | POA: Diagnosis present

## 2014-02-27 DIAGNOSIS — R1011 Right upper quadrant pain: Secondary | ICD-10-CM

## 2014-02-27 DIAGNOSIS — E785 Hyperlipidemia, unspecified: Secondary | ICD-10-CM

## 2014-02-27 DIAGNOSIS — Z79899 Other long term (current) drug therapy: Secondary | ICD-10-CM

## 2014-02-27 DIAGNOSIS — E860 Dehydration: Secondary | ICD-10-CM | POA: Diagnosis present

## 2014-02-27 DIAGNOSIS — E039 Hypothyroidism, unspecified: Secondary | ICD-10-CM | POA: Diagnosis present

## 2014-02-27 DIAGNOSIS — K449 Diaphragmatic hernia without obstruction or gangrene: Secondary | ICD-10-CM | POA: Diagnosis present

## 2014-02-27 DIAGNOSIS — Q393 Congenital stenosis and stricture of esophagus: Secondary | ICD-10-CM

## 2014-02-27 DIAGNOSIS — F418 Other specified anxiety disorders: Secondary | ICD-10-CM

## 2014-02-27 DIAGNOSIS — F331 Major depressive disorder, recurrent, moderate: Secondary | ICD-10-CM | POA: Diagnosis present

## 2014-02-27 DIAGNOSIS — I10 Essential (primary) hypertension: Secondary | ICD-10-CM

## 2014-02-27 DIAGNOSIS — Q391 Atresia of esophagus with tracheo-esophageal fistula: Secondary | ICD-10-CM

## 2014-02-27 DIAGNOSIS — R1115 Cyclical vomiting syndrome unrelated to migraine: Secondary | ICD-10-CM | POA: Diagnosis present

## 2014-02-27 LAB — COMPREHENSIVE METABOLIC PANEL
ALBUMIN: 3.9 g/dL (ref 3.5–5.2)
ALT: 19 U/L (ref 0–35)
AST: 18 U/L (ref 0–37)
Alkaline Phosphatase: 82 U/L (ref 39–117)
BUN: 10 mg/dL (ref 6–23)
CO2: 23 mEq/L (ref 19–32)
CREATININE: 0.7 mg/dL (ref 0.50–1.10)
Calcium: 9.6 mg/dL (ref 8.4–10.5)
Chloride: 103 mEq/L (ref 96–112)
GFR calc Af Amer: >90 mL/min (ref >90)
GFR calc non Af Amer: >90 mL/min (ref >90)
Glucose, Bld: 100 mg/dL — ABNORMAL HIGH (ref 70–99)
Potassium: 4.5 mEq/L (ref 3.7–5.3)
Sodium: 141 mEq/L (ref 137–147)
TOTAL PROTEIN: 7.9 g/dL (ref 6.0–8.3)
Total Bilirubin: 0.7 mg/dL (ref 0.3–1.2)

## 2014-02-27 LAB — URINALYSIS, ROUTINE W REFLEX MICROSCOPIC
Bilirubin Urine: NEGATIVE
GLUCOSE, UA: NEGATIVE mg/dL
Ketones, ur: NEGATIVE mg/dL
LEUKOCYTES UA: NEGATIVE
NITRITE: NEGATIVE
PH: 7 (ref 5.0–8.0)
Protein, ur: NEGATIVE mg/dL
SPECIFIC GRAVITY, URINE: 1.011 (ref 1.005–1.030)
Urobilinogen, UA: 0.2 mg/dL (ref 0.0–1.0)

## 2014-02-27 LAB — CBC WITH DIFFERENTIAL/PLATELET
BASOS PCT: 0 % (ref 0–1)
Basophils Absolute: 0 10*3/uL (ref 0.0–0.1)
Eosinophils Absolute: 0.2 10*3/uL (ref 0.0–0.7)
Eosinophils Relative: 2 % (ref 0–5)
HEMATOCRIT: 44.1 % (ref 36.0–46.0)
Hemoglobin: 14.5 g/dL (ref 12.0–15.0)
Lymphocytes Relative: 21 % (ref 12–46)
Lymphs Abs: 1.5 10*3/uL (ref 0.7–4.0)
MCH: 28.9 pg (ref 26.0–34.0)
MCHC: 32.9 g/dL (ref 30.0–36.0)
MCV: 87.8 fL (ref 78.0–100.0)
MONO ABS: 0.5 10*3/uL (ref 0.1–1.0)
Monocytes Relative: 8 % (ref 3–12)
NEUTROS PCT: 69 % (ref 43–77)
Neutro Abs: 4.9 10*3/uL (ref 1.7–7.7)
Platelets: 251 10*3/uL (ref 150–400)
RBC: 5.02 MIL/uL (ref 3.87–5.11)
RDW: 13.7 % (ref 11.5–15.5)
WBC: 7.1 10*3/uL (ref 4.0–10.5)

## 2014-02-27 LAB — LIPASE, BLOOD: Lipase: 51 U/L (ref 11–59)

## 2014-02-27 LAB — URINE MICROSCOPIC-ADD ON

## 2014-02-27 MED ORDER — SODIUM CHLORIDE 0.9 % IV SOLN
INTRAVENOUS | Status: DC
  Administered 2014-02-27 – 2014-02-28 (×2): via INTRAVENOUS

## 2014-02-27 MED ORDER — ACETAMINOPHEN 325 MG PO TABS
650.0000 mg | ORAL_TABLET | Freq: Four times a day (QID) | ORAL | Status: DC | PRN
  Administered 2014-02-27 – 2014-02-28 (×3): 650 mg via ORAL
  Filled 2014-02-27 (×2): qty 2

## 2014-02-27 MED ORDER — METOCLOPRAMIDE HCL 5 MG/ML IJ SOLN
10.0000 mg | Freq: Once | INTRAMUSCULAR | Status: AC
  Administered 2014-02-27: 10 mg via INTRAVENOUS
  Filled 2014-02-27: qty 2

## 2014-02-27 MED ORDER — PROMETHAZINE HCL 25 MG/ML IJ SOLN
25.0000 mg | INTRAMUSCULAR | Status: DC | PRN
  Administered 2014-02-28: 25 mg via INTRAVENOUS
  Filled 2014-02-27 (×3): qty 1

## 2014-02-27 MED ORDER — ONDANSETRON 8 MG/NS 50 ML IVPB
8.0000 mg | Freq: Four times a day (QID) | INTRAVENOUS | Status: DC
  Administered 2014-02-27 – 2014-03-03 (×11): 8 mg via INTRAVENOUS
  Filled 2014-02-27 (×22): qty 8

## 2014-02-27 MED ORDER — MORPHINE SULFATE 2 MG/ML IJ SOLN
1.0000 mg | INTRAMUSCULAR | Status: DC | PRN
  Administered 2014-02-27: 1 mg via INTRAVENOUS
  Filled 2014-02-27: qty 1

## 2014-02-27 MED ORDER — SODIUM CHLORIDE 0.9 % IV BOLUS (SEPSIS)
1000.0000 mL | Freq: Once | INTRAVENOUS | Status: AC
  Administered 2014-02-27: 1000 mL via INTRAVENOUS

## 2014-02-27 MED ORDER — LEVOTHYROXINE SODIUM 88 MCG PO TABS
88.0000 ug | ORAL_TABLET | Freq: Every day | ORAL | Status: DC
  Filled 2014-02-27 (×2): qty 1

## 2014-02-27 MED ORDER — LORAZEPAM 2 MG/ML IJ SOLN
1.0000 mg | Freq: Once | INTRAMUSCULAR | Status: AC
  Administered 2014-02-27: 1 mg via INTRAVENOUS
  Filled 2014-02-27: qty 1

## 2014-02-27 MED ORDER — HEPARIN SODIUM (PORCINE) 5000 UNIT/ML IJ SOLN
5000.0000 [IU] | Freq: Three times a day (TID) | INTRAMUSCULAR | Status: DC
  Administered 2014-02-27 – 2014-03-03 (×10): 5000 [IU] via SUBCUTANEOUS
  Filled 2014-02-27 (×14): qty 1

## 2014-02-27 MED ORDER — DIPHENHYDRAMINE HCL 50 MG/ML IJ SOLN
25.0000 mg | Freq: Once | INTRAMUSCULAR | Status: AC
  Administered 2014-02-27: 25 mg via INTRAVENOUS
  Filled 2014-02-27: qty 1

## 2014-02-27 MED ORDER — SERTRALINE HCL 50 MG PO TABS
50.0000 mg | ORAL_TABLET | Freq: Every day | ORAL | Status: DC
  Administered 2014-02-28 – 2014-03-03 (×4): 50 mg via ORAL
  Filled 2014-02-27 (×4): qty 1

## 2014-02-27 MED ORDER — PANTOPRAZOLE SODIUM 40 MG IV SOLR
40.0000 mg | INTRAVENOUS | Status: DC
  Administered 2014-02-27 – 2014-02-28 (×3): 40 mg via INTRAVENOUS
  Filled 2014-02-27 (×4): qty 40

## 2014-02-27 MED ORDER — BISACODYL 10 MG RE SUPP: 10.0000 mg | Freq: Every day | RECTAL | Status: DC | PRN

## 2014-02-27 NOTE — ED Notes (Signed)
assit pt. To restroom at this time

## 2014-02-27 NOTE — Telephone Encounter (Signed)
Emergency Line Call  Pt called with complaint of return of intractable nausea / vomiting, stating that for the last few days she has been unable to keep food down and now she can't keep medicines down. Zofran has not been helping. Of note, pt was recently admitted with similar issues but had resolution of symptoms with IV nausea meds, fluids, etc; she was seen in the office two days ago but has not had any relief and feels her symptoms are worse and that she is getting dehydrated again (documentation for that visit not yet completed). She has a severe headache, but no longer has diarrhea. She describes eating food then having it come back up "undigested" after 12 hours or so. She has an appointment with Dr. Ree Kida on Monday 5/11 but feels she can't make it until then.  Advised pt that we could go several routes; suggested trial of Phenergan suppository or other vs being evaluated at Urgent Care or the ED. Given severity of symptoms and lack of relief with PO meds (and inability to keep down regular PO meds), advised pt that being evaluated today (and likely treated with at least IV fluids in the ED) would be wise. Pt stated understanding and will come in for evaluation and possible treatment / admission, today.  Emmaline Kluver, MD PGY-2, [facility] 02/27/2014, 10:44 AM

## 2014-02-27 NOTE — ED Provider Notes (Signed)
CSN: [id number]     Arrival date & time 02/27/14  1146 History   First MD Initiated Contact with Patient 02/27/14 1159     Chief Complaint  Patient presents with  . Emesis  . Headache     (Consider location/radiation/quality/duration/timing/severity/associated sxs/prior Treatment) Patient is a 53 y.o. female presenting with vomiting and headaches. The history is provided by the patient.  Emesis Associated symptoms: headaches   Headache Associated symptoms: vomiting    patient here complaining of worsening abdominal pain with nonbilious vomiting. Was discharged from the hospital after a 2 day  admission for suspected gastroparesis. She denies any fever or chills. Denies any hematemesis. No dysuria hematuria. Has used her home medications without relief. Called her Dr. today and was told to come here. Symptoms are worse when she eats and describes it she has undigested food returning after 12 hours. No trouble swallowing.   Past Medical History  Diagnosis Date  . Hypothyroid   . Overweight   . Hypertension   . Peri-menopause   . Anemia   . High cholesterol   . Chronic bronchitis     "get it q yr; in the spring time" (02/17/2014)  . Multiple environmental allergies   . Migraines     "maybe a couple/yr" (02/17/2014)  . Depression     "have taken zoloft since 1996" (02/17/2014)   Past Surgical History  Procedure Laterality Date  . Shoulder open rotator cuff repair Left 1995  . Shoulder surgery Left 1997    "cartilage"  . Exploratory laparotomy  1987  . Inguinal hernia repair Right ~ 2003  . Appendectomy  1987  . Tubal ligation  1998    laparoscopic  . Vaginal hysterectomy  04/08/2012    menorrhagia  . Tonsillectomy and adenoidectomy  1976  . Foot surgery Right ~1980    "don't know what they did; had to take something out"   Family History  Problem Relation Age of Onset  . Hypertension Father   . Emphysema Father   . Vision loss Father   . Alcohol abuse Father   .  Hypertension Mother   . Heart disease Mother   . Hepatitis Mother     Hep C  . Endometriosis Mother   . Lupus Sister   . Endometriosis Sister   . Breast cancer Maternal Aunt     30's  . Breast cancer Paternal Aunt     79's  . Cancer Paternal Uncle     lung smoker  . Cancer Maternal Grandfather     colon  . Cancer Maternal Aunt     uterine-50's  . Cancer Paternal Aunt     cervical-40's  . Cancer Paternal Uncle     lung cancer   History  Substance Use Topics  . Smoking status: Never Smoker   . Smokeless tobacco: Never Used  . Alcohol Use: Yes     Comment: 02/17/2014 "might have a few drinks a couple times/yr"   OB History   Grav Para Term Preterm Abortions TAB SAB Ect Mult Living   2 2 2       2      Review of Systems  Gastrointestinal: Positive for vomiting.  Neurological: Positive for headaches.  All other systems reviewed and are negative.     Allergies  Codeine; Erythromycin; and Iohexol  Home Medications   Prior to Admission medications   Medication Sig Start Date End Date Taking? Authorizing Provider  bisacodyl (DULCOLAX) 10 MG suppository Place 1 suppository (  10 mg total) rectally as needed for moderate constipation. 02/23/14   Vance Gather, MD  fluticasone (FLONASE) 50 MCG/ACT nasal spray Place 1 spray into both nostrils daily.  01/29/14   Historical Provider, MD  hydrochlorothiazide (HYDRODIURIL) 25 MG tablet Take 0.5 tablets (12.5 mg total) by mouth daily. 12/28/13   Kandis Nab, MD  levothyroxine (SYNTHROID, LEVOTHROID) 88 MCG tablet Take 1 tablet (88 mcg total) by mouth daily before breakfast. 11/09/13   Kandis Nab, MD  omeprazole (PRILOSEC) 40 MG capsule Take 1 capsule (40 mg total) by mouth daily. 02/23/14   Vance Gather, MD  ondansetron (ZOFRAN-ODT) 4 MG disintegrating tablet Take 4 mg by mouth every 4 (four) hours as needed for nausea or vomiting.    Historical Provider, MD  oxyCODONE-acetaminophen (PERCOCET) 5-325 MG per tablet Take 1-2 tablets by  mouth every 6 (six) hours as needed. 02/10/14   Blanchard Kelch, MD  polyethylene glycol powder (GLYCOLAX/MIRALAX) powder Take 17 g by mouth daily. 02/23/14   Vance Gather, MD  sertraline (ZOLOFT) 100 MG tablet Take 50 mg by mouth daily. 04/13/13   Kandis Nab, MD  SUMAtriptan (IMITREX) 100 MG tablet Take 100 mg by mouth every 2 (two) hours as needed for migraine.  11/22/13   Historical Provider, MD   BP 133/92  Pulse 83  Temp(Src) 97.9 F (36.6 C) (Oral)  Resp 20  Wt 179 lb (81.194 kg)  SpO2 98%  LMP 04/03/2012 Physical Exam  Nursing note and vitals reviewed. Constitutional: She is oriented to person, place, and time. She appears well-developed and well-nourished.  Non-toxic appearance. No distress.  HENT:  Head: Normocephalic and atraumatic.  Eyes: Conjunctivae, EOM and lids are normal. Pupils are equal, round, and reactive to light.  Neck: Normal range of motion. Neck supple. No tracheal deviation present. No mass present.  Cardiovascular: Normal rate, regular rhythm and normal heart sounds.  Exam reveals no gallop.   No murmur heard. Pulmonary/Chest: Effort normal and breath sounds normal. No stridor. No respiratory distress. She has no decreased breath sounds. She has no wheezes. She has no rhonchi. She has no rales.  Abdominal: Soft. Normal appearance and bowel sounds are normal. She exhibits no distension. There is no tenderness. There is no rebound and no CVA tenderness.  Musculoskeletal: Normal range of motion. She exhibits no edema and no tenderness.  Neurological: She is alert and oriented to person, place, and time. She has normal strength. No cranial nerve deficit or sensory deficit. GCS eye subscore is 4. GCS verbal subscore is 5. GCS motor subscore is 6.  Skin: Skin is warm and dry. No abrasion and no rash noted.  Psychiatric: She has a normal mood and affect. Her speech is normal and behavior is normal.    ED Course  Procedures (including critical care time) Labs  Review Labs Reviewed  CBC WITH DIFFERENTIAL  COMPREHENSIVE METABOLIC PANEL  LIPASE, BLOOD  URINALYSIS, ROUTINE W REFLEX MICROSCOPIC    Imaging Review No results found.   EKG Interpretation None      MDM   Final diagnoses:  None    Patient given IV fluids and medications for nausea vomiting. She remains symptomatic. Have spoken with the family practice teaching service and they will come to the patient   Leota Jacobsen, MD 02/27/14 1422

## 2014-02-27 NOTE — ED Notes (Signed)
Admitting MD paged to phone Re: plan of care

## 2014-02-27 NOTE — H&P (Signed)
[facility] Admission History and Physical Service Pager: [phone]  Patient name: Gina Wise Medical record number: [id number] Date of birth: [birth year] Age: 52 y.o. Gender: female  Primary Care Provider: Liam Graham, MD Consultants: GI Code Status: full  Chief Complaint: n/v  Assessment and Plan: Gina Wise is a 53 y.o. female presenting with return of nausea and vomiting in the absence of diarrhea. PMH is significant for hyperthyroidism, HTN, Migraines, Depression & multiple abdominal surgeries (appendectomy, tubal ligation, hysterectomy, inguinal hernia repair)  #FEN/GI: N/V of unknown etiology; Ddx includes prolonged gastroenteritis (white count initially elevated on prior presentation although 7.1 today, negative stool studies from prior admission; was not taking abx at home) vs esophageal spasm (report hx of food getting stuck and coming back up entirely undigested) vs EOE(has hx of seasonal allergies and environmental exposures although no eosinophilia seen on diff) vs GERD (reporting sleeping on several pillows at night to prevent dyspepsia also with epigastric tenderness) vs gastroparesis with delayed emptying (no known diabetic history or neurodegeneration but will check a1c here) vs hypochondriasis (psychiatric hx, anxious affect); no assc diarrheal component. Does have extensive hx of abdominal surgeries predisposing to adhesions. Physical exam reassuring, no signs of acute abdomen  -admit to med surg under Dr. Gwendlyn Deutscher -Consult placed to GI, appreciate recs and management -may need to be NPO for possible EGD vs gastric emptying study -NS @ 125 cc/hr; CLD  -A1C, TSH -hold off on plain films for now -dulcolax prn -zofran 8mg  q4 -phenergan 25mg  q4 prn -protonix 40mg  IV -may need to consider repeat imaging to look for abdominal cause and/or consider head CT for NPH, if symptoms worsen or do not improve despite treatment, or if no other cause can be  found  # CV: HTN/tahcycardia: Currently well controlled, normotensive in the ED with some tachycardia. May be related to dehydration vs anxiety vs strain from emesis - Continue HCTZ  - continue to monitor vs per floor protocol -orthostatic vitals  # Psych: Depression: certainly could be contributing to above - Continue Zoloft  - continue to reassess social situation; reportedly living with roommate  #Neuro: Current headache; hx of migraine headaches, certainly sounds like tension headache 2/2 dehydration; nml neuro exam, no focal deficits -could consider CT head for eval of increased ICP if GI w/up neg -serial neuro exams for changes -tylenol prn  # Endo: Hypothyroidism: TSH nml during last admission; would be unusual for sudden change but will reassess regardless - TSH - trial of synthyroid PO; made need to switch to IV pending toleration of orals Prophylaxis: Heparin Subq  Disposition: admit to med-surg under Dr. Gwendlyn Deutscher  History of Present Illness: Gina Wise is a 53 y.o. female presenting with return of emesis after recent hospital discharge (admission from April 29-May 2). Per patient was discharged on May 2nd at which time she was without sx. Noted had not had BMs after discharge, followed up on Tuesday at [facility] and was given rx for miralax after which time had noted soft light brown formed BMs daily. Returned to clinic on Thursday after pt developed NBNB emesis Wednesday night. Was told to decrease quantity of miralax to avoid bloating and eat small meals. Since that time has had unquantifiable amts of vomiting. Has only been able to keep down water and OJ and coke despite taking both zofran and phenergan. Has had assc throbbing frontal headaches that are persistent but no visual changes or weakness; she does have a history of migraines and has taken Imitrex  at home with only mild relief, but feels her headaches now are different and possibly related to her emesis / dehydration. Pt  called after hours line this morning given persistence of sx and told to come to the hospital.  In the ED pt given IVF and antiemetics (reglan, benadryl, decadron) with minimal to no relief. U/A with trace hemoglobin CBC without WBC CMET without metabolic derrangement.Of note KUB 1 week ago with no obstructive component. CT ab/pelvis with no volvulus or SBO. WBC initially elevated. CMET and lipase at that time wnl. U/A without evidence of UTI. C diff negative, though recently completed a Z pack. UDS negative. TSH within normal limits. During her previous admission she was started on IV medications including cipro and flagyl to cover gut bacteria but these were d/c'd on discharge.  Review Of Systems: Per HPI with the following additions: no rashes or sick contacts or other changes in health Otherwise 12 point review of systems was performed and was unremarkable.  Patient Active Problem List   Diagnosis Date Noted  . Unspecified constipation 02/23/2014  . GERD (gastroesophageal reflux disease) 02/23/2014  . Intractable nausea and vomiting 02/17/2014  . Abdominal pain, right upper quadrant 02/15/2014  . Goiter 07/03/2013  . Cervical lymphadenopathy 06/15/2013  . Depression with anxiety 06/15/2013  . Cough 01/02/2013  . Headache 10/24/2012  . Iron deficiency 03/13/2012  . Fatigue 03/12/2012  . Dysfunctional uterine bleeding 01/02/2012  . Otitis externa 12/30/2011  . Insomnia 10/03/2011  . Dyslipidemia 09/03/2011  . Neck pain, chronic 09/03/2011  . Overweight 01/03/2011  . 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 MIGRAINE, CHRONIC 10/04/2009  . Unspecified hypothyroidism 09/09/2009  . Essential hypertension, benign 09/09/2009   Past Medical History: Past Medical History  Diagnosis Date  . Hypothyroid   . Overweight   . Hypertension   . Peri-menopause   . Anemia   . High cholesterol   . Chronic bronchitis     "get it q yr; in the spring time" (02/17/2014)  .  Multiple environmental allergies   . Migraines     "maybe a couple/yr" (02/17/2014)  . Depression     "have taken zoloft since 1996" (02/17/2014)   Past Surgical History: Past Surgical History  Procedure Laterality Date  . Shoulder open rotator cuff repair Left 1995  . Shoulder surgery Left 1997    "cartilage"  . Exploratory laparotomy  1987  . Inguinal hernia repair Right ~ 2003  . Appendectomy  1987  . Tubal ligation  1998    laparoscopic  . Vaginal hysterectomy  04/08/2012    menorrhagia  . Tonsillectomy and adenoidectomy  1976  . Foot surgery Right ~1980    "don't know what they did; had to take something out"   Social History: History  Substance Use Topics  . Smoking status: Never Smoker   . Smokeless tobacco: Never Used  . Alcohol Use: Yes     Comment: 02/17/2014 "might have a few drinks a couple times/yr"   Additional social history: non smoker; lives with roommate; has 2 sons Please also refer to relevant sections of EMR.  Family History: Family History  Problem Relation Age of Onset  . Hypertension Father   . Emphysema Father   . Vision loss Father   . Alcohol abuse Father   . Hypertension Mother   . Heart disease Mother   . Hepatitis Mother     Hep C  . Endometriosis Mother   . Lupus Sister   .  Endometriosis Sister   . Breast cancer Maternal Aunt     30's  . Breast cancer Paternal Aunt     16's  . Cancer Paternal Uncle     lung smoker  . Cancer Maternal Grandfather     colon  . Cancer Maternal Aunt     uterine-50's  . Cancer Paternal Aunt     cervical-40's  . Cancer Paternal Uncle     lung cancer   Allergies and Medications: Allergies  Allergen Reactions  . Codeine Nausea Only  . Erythromycin Nausea And Vomiting    REACTION: Nausea Severe abdominal pain  . Iohexol Hives   Current Facility-Administered Medications on File Prior to Encounter  Medication Dose Route Frequency Provider Last Rate Last Dose  . 0.9 %  sodium chloride infusion    Intravenous Continuous [name], MD 150 mL/hr at 02/16/14 1213     Current Outpatient Prescriptions on File Prior to Encounter  Medication Sig Dispense Refill  . bisacodyl (DULCOLAX) 10 MG suppository Place 1 suppository (10 mg total) rectally as needed for moderate constipation.  12 suppository  0  . fluticasone (FLONASE) 50 MCG/ACT nasal spray Place 1 spray into both nostrils daily.       . hydrochlorothiazide (HYDRODIURIL) 25 MG tablet Take 0.5 tablets (12.5 mg total) by mouth daily.  15 tablet  3  . levothyroxine (SYNTHROID, LEVOTHROID) 88 MCG tablet Take 1 tablet (88 mcg total) by mouth daily before breakfast.  30 tablet  6  . omeprazole (PRILOSEC) 40 MG capsule Take 1 capsule (40 mg total) by mouth daily.  30 capsule  3  . ondansetron (ZOFRAN-ODT) 4 MG disintegrating tablet Take 4 mg by mouth every 4 (four) hours as needed for nausea or vomiting.      Marland Kitchen oxyCODONE-acetaminophen (PERCOCET) 5-325 MG per tablet Take 1-2 tablets by mouth every 6 (six) hours as needed.  10 tablet  0  . polyethylene glycol powder (GLYCOLAX/MIRALAX) powder Take 17 g by mouth daily.  3350 g  1  . sertraline (ZOLOFT) 100 MG tablet Take 50 mg by mouth daily.      . SUMAtriptan (IMITREX) 100 MG tablet Take 100 mg by mouth every 2 (two) hours as needed for migraine.         Objective: BP 128/72  Pulse 69  Temp(Src) 97.9 F (36.6 C) (Oral)  Resp 17  Wt 179 lb (81.194 kg)  SpO2 100%  LMP 04/03/2012 Exam: General: well appearing, tearful at times, with anxious affect but appropriate HEENT: NCAT, dry MM, no oral pharyngeal lesions; EOMI, PERRL Cardiovascular: RRR, no murmurs, rubs or gallops Respiratory: CTAB, no wheezes, normal WOB Abdomen: soft, mildly tender diffusely to light palpation, worse in epigastric region; non distended, normoactive BS Extremities: WWP Skin: no rashes or lesions Neuro: A&OX3; no focal deficits; CN ii-xii grossly in tact   Labs and Imaging: CBC BMET   Recent Labs Lab  02/27/14 1210  WBC 7.1  HGB 14.5  HCT 44.1  PLT 251    Recent Labs Lab 02/27/14 1210  NA 141  K 4.5  CL 103  CO2 23  BUN 10  CREATININE 0.70  GLUCOSE 100*  CALCIUM 9.6       Langston Masker, MD 02/27/2014, 2:23 PM PGY-1, [facility] pager: [phone], text pages welcome  FPTS Upper-Level Resident Addendum  I have independently interviewed and examined the patient. I have discussed the above with Dr. Skeet Simmer and agree with her documentation as above.  The above reflects her original note with my edits for correction/additions/clarification in orange. Please see also any attending notes.   Emmaline Kluver, MD PGY-2, [facility] pager: [phone] (text pages welcome through [facility])

## 2014-02-27 NOTE — ED Notes (Signed)
Pt. Stated, i was just released out of the hospital for being dx with gastropore isis and now everything i eat stays in my stomach for about 10 to 12 hours and comes back up undigested.  Ive only sipped on something to drink.  Now I have a headache and i think its because Im dehydrated.

## 2014-02-27 NOTE — Assessment & Plan Note (Addendum)
Patient with vomiting regardless of anti-emetic use. All imaging in reassuring. She is having regular movements to rule out obstruction. She isn't tachycardic and normal blood pressure with reassuring abdominal exam to rule out acute abdominal infection.  No nuchal rigidity and no gross deficits to suspect any meningeal infection.  She hasn't displayed any hyperglycemia to suggest a component of gastroparesis.  Avoiding reglan use to avoid clouding the overall picture and any tardive.  Feel that there is a component of somatoform and hypochondriasis associated with her symptoms. She thinks that there is something wrong with her regardless of imaging and her vital signs. She says her problems are not found in a book.   - decrease the amount of miralax to avoid bloating  - eat and drink fluids that are tolerable, increasing as tolerated.  - advised weekly f/u with her PCP on a weekly basis but she doesn't wan to f/u with her PCP Dr. Otis Dials. She would rather f/u with Dr. Ree Kida or Dr. Bonner Puna.  - Discussed with Dr. Loma Messing

## 2014-02-27 NOTE — Progress Notes (Signed)
Subjective:    Patient ID: Gina Wise, female    DOB: Mar 13, 1961, 53 y.o.   MRN: [id number]  HPI  Gina Wise is here for due to continued emesis.   She was recently hospitalized for the inability to take PO. During that stay she CT ab/pelvis that was unremarkable and lipase within normal limits.  She was diagnosed with a viral gastroenteritis.  Saw Dr. Bonner Puna on 5/5 and obtained a KUB that was normal.  She has been taking 1/2 cup of miralax and is now having regular bowel movements with her last movement this morning.    She has had worsening emesis the last two days.  She reports eating breakfast, lunch and supper and the food doesn't digest but sits in her stomach. She has emesis after eating the meals during the day. The emesis is food contents and non bloody.  During her hospital admission she was able to tolerate eating food. She is able to drink liquids and broth with no problems.  Her emesis doesn't improve with zofran or phenergan.  She denies any abdominal pain, back pain, dysuria or fevers.  She does have epigastric pain, headache, nausea and vomiting.  Upon further questioning, she feels that her symptoms are outside the scope of what is found in a book. She feels like there is something wrong with her regardless of what the imaging displays.  She is perseverating on that she may have a hiatal hernia.    Current Outpatient Prescriptions on File Prior to Visit  Medication Sig Dispense Refill  . bisacodyl (DULCOLAX) 10 MG suppository Place 1 suppository (10 mg total) rectally as needed for moderate constipation.  12 suppository  0  . fluticasone (FLONASE) 50 MCG/ACT nasal spray Place 1 spray into both nostrils daily.       . hydrochlorothiazide (HYDRODIURIL) 25 MG tablet Take 0.5 tablets (12.5 mg total) by mouth daily.  15 tablet  3  . levothyroxine (SYNTHROID, LEVOTHROID) 88 MCG tablet Take 1 tablet (88 mcg total) by mouth daily before breakfast.  30 tablet  6  . omeprazole  (PRILOSEC) 40 MG capsule Take 1 capsule (40 mg total) by mouth daily.  30 capsule  3  . ondansetron (ZOFRAN-ODT) 4 MG disintegrating tablet Take 4 mg by mouth every 4 (four) hours as needed for nausea or vomiting.      . sertraline (ZOLOFT) 100 MG tablet Take 50 mg by mouth daily.      . SUMAtriptan (IMITREX) 100 MG tablet Take 100 mg by mouth every 2 (two) hours as needed for migraine.       Marland Kitchen oxyCODONE-acetaminophen (PERCOCET) 5-325 MG per tablet Take 1-2 tablets by mouth every 6 (six) hours as needed.  10 tablet  0  . polyethylene glycol powder (GLYCOLAX/MIRALAX) powder Take 17 g by mouth daily.  3350 g  1   Current Facility-Administered Medications on File Prior to Visit  Medication Dose Route Frequency Provider Last Rate Last Dose  . 0.9 %  sodium chloride infusion   Intravenous Continuous [name], MD 150 mL/hr at 02/16/14 1213     Review of Systems See HPI    Objective:   Physical Exam BP 132/77  Pulse 90  Temp(Src) 98.3 F (36.8 C) (Oral)  Wt 180 lb (81.647 kg)  LMP 04/03/2012 Gen: Cooperative with exam, tearful during HPI, Caucasian female  HEENT: NCAT, PERRL, clear conjunctiva, oropharynx clear, supple neck, no oral ulcer, MMM CV: RRR, good S1/S2, no murmur, no edema, capillary refill  brisk  Resp: CTABL, no wheezes, non-labored Abd: SNTND, BS present, no guarding or organomegaly Skin: no rashes, normal turgor  Neuro: no gross deficits.  Psych: poor insight and anxious       Assessment & Plan:

## 2014-02-27 NOTE — ED Notes (Signed)
Pts HR Increased to 136, pt placed on cardiac monitor, EKG completed, Allen, MD informed, HR returned to NSR 84 HR

## 2014-02-28 ENCOUNTER — Encounter (HOSPITAL_COMMUNITY): Payer: Self-pay | Admitting: *Deleted

## 2014-02-28 ENCOUNTER — Encounter (HOSPITAL_COMMUNITY)
Admission: EM | Disposition: A | Discharge status: Home / Self Care | Payer: Self-pay | Source: Home / Self Care | Attending: Family Medicine

## 2014-02-28 DIAGNOSIS — R51 Headache: Secondary | ICD-10-CM

## 2014-02-28 DIAGNOSIS — R1115 Cyclical vomiting syndrome unrelated to migraine: Secondary | ICD-10-CM

## 2014-02-28 DIAGNOSIS — G43909 Migraine, unspecified, not intractable, without status migrainosus: Secondary | ICD-10-CM

## 2014-02-28 HISTORY — PX: ESOPHAGOGASTRODUODENOSCOPY: SHX5428

## 2014-02-28 LAB — BASIC METABOLIC PANEL
BUN: 7 mg/dL (ref 6–23)
CALCIUM: 8.7 mg/dL (ref 8.4–10.5)
CO2: 21 mEq/L (ref 19–32)
Chloride: 109 mEq/L (ref 96–112)
Creatinine, Ser: 0.66 mg/dL (ref 0.50–1.10)
Glucose, Bld: 88 mg/dL (ref 70–99)
Potassium: 3.8 mEq/L (ref 3.7–5.3)
Sodium: 145 mEq/L (ref 137–147)

## 2014-02-28 LAB — TSH: TSH: 5.14 u[IU]/mL — ABNORMAL HIGH (ref 0.350–4.500)

## 2014-02-28 LAB — GLUCOSE, CAPILLARY: Glucose-Capillary: 94 mg/dL (ref 70–99)

## 2014-02-28 LAB — HEMOGLOBIN A1C
HEMOGLOBIN A1C: 5.8 % — AB (ref <5.7)
Mean Plasma Glucose: 120 mg/dL — ABNORMAL HIGH (ref <117)

## 2014-02-28 SURGERY — EGD (ESOPHAGOGASTRODUODENOSCOPY)
Anesthesia: Moderate Sedation

## 2014-02-28 MED ORDER — BUTAMBEN-TETRACAINE-BENZOCAINE 2-2-14 % EX AERO
INHALATION_SPRAY | CUTANEOUS | Status: DC | PRN
  Administered 2014-02-28: 2 via TOPICAL

## 2014-02-28 MED ORDER — DIPHENHYDRAMINE HCL 50 MG/ML IJ SOLN
INTRAMUSCULAR | Status: AC
  Filled 2014-02-28: qty 1

## 2014-02-28 MED ORDER — MORPHINE SULFATE 2 MG/ML IJ SOLN
1.0000 mg | INTRAMUSCULAR | Status: DC | PRN
  Administered 2014-02-28 – 2014-03-01 (×2): 1 mg via INTRAVENOUS
  Filled 2014-02-28 (×2): qty 1

## 2014-02-28 MED ORDER — FENTANYL CITRATE 0.05 MG/ML IJ SOLN
INTRAMUSCULAR | Status: AC
  Filled 2014-02-28: qty 2

## 2014-02-28 MED ORDER — METOCLOPRAMIDE HCL 5 MG/ML IJ SOLN
10.0000 mg | Freq: Four times a day (QID) | INTRAMUSCULAR | Status: DC
  Administered 2014-02-28 – 2014-03-02 (×9): 10 mg via INTRAVENOUS
  Filled 2014-02-28 (×14): qty 2

## 2014-02-28 MED ORDER — LEVOTHYROXINE SODIUM 100 MCG IV SOLR
50.0000 ug | Freq: Every day | INTRAVENOUS | Status: DC
  Administered 2014-02-28: 10:00:00 via INTRAVENOUS
  Administered 2014-03-01: 50 ug via INTRAVENOUS
  Filled 2014-02-28 (×3): qty 5

## 2014-02-28 MED ORDER — SODIUM CHLORIDE 0.9 % IV SOLN
INTRAVENOUS | Status: DC
  Administered 2014-02-28: 15:00:00 via INTRAVENOUS

## 2014-02-28 MED ORDER — FENTANYL CITRATE 0.05 MG/ML IJ SOLN
INTRAMUSCULAR | Status: DC | PRN
  Administered 2014-02-28 (×3): 25 ug via INTRAVENOUS

## 2014-02-28 MED ORDER — MIDAZOLAM HCL 10 MG/2ML IJ SOLN
INTRAMUSCULAR | Status: DC | PRN
  Administered 2014-02-28: 1 mg via INTRAVENOUS
  Administered 2014-02-28: 2 mg via INTRAVENOUS
  Administered 2014-02-28: 1 mg via INTRAVENOUS
  Administered 2014-02-28 (×2): 2 mg via INTRAVENOUS

## 2014-02-28 MED ORDER — MIDAZOLAM HCL 5 MG/ML IJ SOLN
INTRAMUSCULAR | Status: AC
  Filled 2014-02-28: qty 2

## 2014-02-28 MED ORDER — METOCLOPRAMIDE HCL 5 MG/ML IJ SOLN: 10.0000 mg | Freq: Four times a day (QID) | INTRAMUSCULAR | Status: DC | PRN

## 2014-02-28 MED ORDER — LEVOTHYROXINE SODIUM 100 MCG PO TABS
100.0000 ug | ORAL_TABLET | Freq: Every day | ORAL | Status: DC
  Filled 2014-02-28 (×2): qty 1

## 2014-02-28 MED ORDER — DIPHENHYDRAMINE HCL 50 MG/ML IJ SOLN
INTRAMUSCULAR | Status: DC | PRN
  Administered 2014-02-28: 25 mg via INTRAVENOUS

## 2014-02-28 NOTE — Op Note (Signed)
[facility] [place], [address]   ENDOSCOPY PROCEDURE REPORT  PATIENT: Gina, Wise  MR#: [id number] BIRTHDATE: Jan 27, 1961 , 53  yrs. old GENDER: Female ENDOSCOPIST: Jerene Bears, MD REFERRED BY:  Family Medicine Teaching Service, M.D. PROCEDURE DATE:  02/28/2014 PROCEDURE:  EGD w/ biopsy for H.pylori ASA CLASS:     Class III INDICATIONS:  Nausea.   Vomiting. MEDICATIONS: These medications were titrated to patient response per physician's verbal order, Diphenhydramine (Benadryl) 25 mg IV, Fentanyl 75 mcg IV, and Versed 8 mg IV TOPICAL ANESTHETIC: Cetacaine Spray  DESCRIPTION OF PROCEDURE: After the risks benefits and alternatives of the procedure were thoroughly explained, informed consent was obtained.  The Pentax Gastroscope [id number] endoscope was introduced through the mouth and advanced to the second portion of the duodenum. Without limitations.  The instrument was slowly withdrawn as the mucosa was fully examined.     ESOPHAGUS: A Schatzki ring was found at the gastroesophageal junction, There was no resistance to passage of the adult upper endoscope.  The ring is located 36 cm from the incisors.   The esophagus was otherwise normal.  There is a 3 cm hiatal hernia present  STOMACH: The mucosa of the stomach appeared normal.  Biopsies were taken in the antrum and angularis.  DUODENUM: Mild duodenal inflammation was found in the duodenal bulb. the examined portions of the second duodenum were normal.  Retroflexed views revealed a hiatal hernia.     The scope was then withdrawn from the patient and the procedure completed.  COMPLICATIONS: There were no complications.  ENDOSCOPIC IMPRESSION: 1.   Schatzki ring was found at the gastroesophageal junction; 3 cm hiatal hernia 2.   The esophagus was otherwise normal. 3.   The mucosa of the stomach appeared normal; biopsies were taken in the antrum and angularis 4.   Duodenal  inflammation was found in the duodenal bulb 5.   Normal examined 2nd duodenum    RECOMMENDATIONS: 1.  Await biopsy results 2.  Follow-up of helicobacter pylori status, treat if indicated 3.  Trial of metoclopramide before meals and at bedtime vs.  gastric emptying study 1st 4.  Repeat EGD can be performed as an outpatient for dilation of Schatzki's ring, if needed for esophageal dysphagia.  Any future GI procedures will need to be performed with monitored anesthesia care  eSigned:  Jerene Bears, MD 02/28/2014 12:40 PM   CC:The Patient  PATIENT NAME:  Gina, Wise MR#: [id number]

## 2014-02-28 NOTE — Progress Notes (Signed)
GI Physician with patient. PO meds given staggered in prevention of further nausea and vomiting.

## 2014-02-28 NOTE — Progress Notes (Signed)
Pt returns to room 5n 506. Awake alert, oriented, slightly drowsy.

## 2014-02-28 NOTE — Consult Note (Signed)
[facility] Consult: 10:07 AM 02/28/2014  LOS: 1 day    Referring Provider: Dr Otis Dials of family medicine service  Primary Care Physician:  Liam Graham, MD Primary Gastroenterologist:  Althia Forts.    Reason for Consultation:  Nausea and vomiting.    HPI: Gina Wise is a 53 y.o. female.  S/p ex lap with appendectomy for finding of scar tissue surrounding appendix in her late 84s.  Lap tubal ligation in her early 62s.  Right inguinal hernia repair with mesh in her 32s. Hx hypothyroidism, hx cervical adenopathy on CT of 8/20124.  anxiety/depression.   Treated for cough with Z pack by ED on 4/17 Starting 4/22 she was having epigastric pain and diarrhea, nausea.  Treated at ED with IVF/zofran.  Elevated WBC of 15.5 count. Presumed viral illness, given Rx for Percocet and Zofran.   Back to ED 4/24 with vomiting, epig pain, diarrhea.  She had low potassium level of 3.2.  WBCs down to 12.3.  chemistries otherwise unrevealing. Treated with fluids and anti- nauseal.  Treated with IVF, phenergen IV at office visit 4/28. Admitted 4/29 - 5/2 with intractable n/v and diarrhea, epigastric/RUQ pain. Urine and blood labs unrevealing. Nothing to suggest cholecystitis (LFTs normal but did not have ultrasound or HIDA  Treated with Cipro/Flagyl but no abx going home.  Stool studies including clx, e coli 0157 were negative.   The diarrhea resolved, in fact Miralax added by PMD at office follow up ince she had had no BM for 6 days.  She started having regular, non diarrheal stools since then.  However the nausea, vomiting and epigastric pain is ongoing.  She vomits up foods she ate 10 to 12 hours earlier.  Readmitted 5/9.  Labs of urine and blood still unrevealing.   No NSAIDs.  Takes Omeprazole daily for >2 months to treat vocal  hoarseness her ENT feels may be due to reflux.  She still has a hoarse voice and describes some solid > Liquid dysphagia.   Weight loss of 10 # in last month.     Sister has diverticulitis and gastritis but suffers from lupus.  Both parents had PUD, mom s/p Bilroth surgery.   Past Medical History  Diagnosis Date  . Hypothyroid   . Overweight   . Hypertension   . Peri-menopause   . Anemia   . High cholesterol   . Chronic bronchitis     "get it q yr; in the spring time" (02/17/2014)  . Multiple environmental allergies   . Migraines     "maybe a couple/yr" (02/17/2014)  . Depression     "have taken zoloft since 1996" (02/17/2014)    Past Surgical History  Procedure Laterality Date  . Shoulder open rotator cuff repair Left 1995  . Shoulder surgery Left 1997    "cartilage"  . Exploratory laparotomy  1987  . Inguinal hernia repair Right ~ 2003  . Appendectomy  1987  . Tubal ligation  1998    laparoscopic  . Vaginal hysterectomy  04/08/2012    menorrhagia  . Tonsillectomy and adenoidectomy  [place] surgery Right ~1980    "don't know what they did; had to take something out"    Prior to Admission medications   Medication Sig Start Date End Date Taking? Authorizing Provider  fluticasone (FLONASE) 50 MCG/ACT nasal spray Place 1 spray into both nostrils daily.  01/29/14  Yes Historical Provider, MD  hydrochlorothiazide (HYDRODIURIL) 25 MG tablet Take 0.5 tablets (12.5 mg total) by mouth daily. 12/28/13  Yes Kandis Nab, MD  levothyroxine (SYNTHROID, LEVOTHROID) 88 MCG tablet Take 1 tablet (88 mcg total) by mouth daily before breakfast. 11/09/13  Yes Kandis Nab, MD  omeprazole (PRILOSEC) 40 MG capsule Take 1 capsule (40 mg total) by mouth daily. 02/23/14  Yes Vance Gather, MD  ondansetron (ZOFRAN-ODT) 4 MG disintegrating tablet Take 4 mg by mouth every 4 (four) hours as needed for nausea or vomiting.   Yes Historical Provider, MD  oxyCODONE-acetaminophen (PERCOCET) 5-325 MG per  tablet Take 1-2 tablets by mouth every 6 (six) hours as needed. 02/10/14  Yes Blanchard Kelch, MD  sertraline (ZOLOFT) 100 MG tablet Take 50 mg by mouth daily. 04/13/13  Yes Kandis Nab, MD  SUMAtriptan (IMITREX) 100 MG tablet Take 100 mg by mouth every 2 (two) hours as needed for migraine.  11/22/13  Yes Historical Provider, MD    Scheduled Meds: . heparin  5,000 Units Subcutaneous 3 times per day  . levothyroxine  50 mcg Intravenous Daily  . ondansetron (ZOFRAN) IV  8 mg Intravenous 4 times per day  . pantoprazole (PROTONIX) IV  40 mg Intravenous Q24H  . sertraline  50 mg Oral Daily   Infusions: . sodium chloride 125 mL/hr at 02/28/14 0550   PRN Meds: acetaminophen, bisacodyl, morphine injection, promethazine   Allergies as of 02/27/2014 - Review Complete 02/27/2014  Allergen Reaction Noted  . Codeine Nausea Only   . Erythromycin Nausea And Vomiting   . Iohexol Hives 02/12/2014    Family History  Problem Relation Age of Onset  . Hypertension Father   . Emphysema Father   . Vision loss Father   . Alcohol abuse Father   . Hypertension Mother   . Heart disease Mother   . Hepatitis Mother     Hep C  . Endometriosis Mother   . Lupus Sister   . Endometriosis Sister   . Breast cancer Maternal Aunt     30's  . Breast cancer Paternal Aunt     57's  . Cancer Paternal Uncle     lung smoker  . Cancer Maternal Grandfather     colon  . Cancer Maternal Aunt     uterine-50's  . Cancer Paternal Aunt     cervical-40's  . Cancer Paternal Uncle     lung cancer    History   Social History  . Marital Status: Single    Spouse Name: N/A    Number of Children: N/A  . Years of Education: N/A   Occupational History  . Not on file.   Social History Main Topics  . Smoking status: Never Smoker   . Smokeless tobacco: Never Used  . Alcohol Use: Yes     Comment: 02/17/2014 "might have a few drinks a couple times/yr"  . Drug Use: No  . Sexual Activity: Not Currently     Birth Control/ Protection: Abstinence   Other Topics Concern  . Not on file   Social History Narrative   Works at [employer], clerical job.  REVIEW OF SYSTEMS: Constitutional:  10 # weight loss, tired ENT:  No nose bleeds Pulm:  No SOB, hoarse voice CV:  No palpitations, no LE edema.  GU:  No hematuria, no frequency GI:  Per HPI Heme:  Took iron when having menorrhagia   Transfusions:  never Neuro:  No headaches, no peripheral tingling or numbness Derm:  No itching, no rash or sores.  Endocrine:  No sweats or chills.  No polyuria or dysuria Immunization:  Not queried.  Travel:  None beyond local counties in last few months.    PHYSICAL EXAM: Vital signs in last 24 hours: Filed Vitals:   02/28/14 0643  BP: 130/72  Pulse: 70  Temp: 97.4 F (36.3 C)  Resp: 18   Wt Readings from Last 3 Encounters:  02/27/14 81.194 kg (179 lb)  02/25/14 81.647 kg (180 lb)  02/23/14 81.557 kg (179 lb 12.8 oz)    General: comfortable, depressed but non-ill appearing WF. Head:  No asymmetry or swelling  Eyes:  No icterus or pallor  Ears:  Not HOH  Nose:  No discharge or congestion Mouth:  No sores, moist MM Neck:  No mass, no TMG Lungs:  Clear bil.  No cough or dyspnea Heart: RRR.  No MRG Abdomen:  Soft, NT, BS hypoactive.   Rectal: deferred   Musc/Skeltl: no joint pain or swelling.  Extremities:  No CCE  Neurologic:  No tremor, no limb weakness.  oriente x 3.  Alert.  Skin:  No rash, no telangectasia Tattoos:  none Nodes:  No cervical adenopathy   Psych:  Pleasant, cooperative, relaxed but flat affect.   Intake/Output from previous day: 05/09 0701 - 05/10 0700 In: 2572.9 [P.O.:600; I.V.:1872.9; IV Piggyback:100] Out: -  Intake/Output this shift:    LAB RESULTS:  Recent Labs  02/27/14 1210  WBC 7.1  HGB 14.5  HCT 44.1  PLT 251   BMET Lab Results  Component Value Date   NA 145 02/28/2014   NA 141 02/27/2014   NA 142 02/20/2014   K 3.8 02/28/2014   K 4.5 02/27/2014     K 4.0 02/20/2014   CL 109 02/28/2014   CL 103 02/27/2014   CL 104 02/20/2014   CO2 21 02/28/2014   CO2 23 02/27/2014   CO2 22 02/20/2014   GLUCOSE 88 02/28/2014   GLUCOSE 100* 02/27/2014   GLUCOSE 90 02/20/2014   BUN 7 02/28/2014   BUN 10 02/27/2014   BUN 7 02/20/2014   CREATININE 0.66 02/28/2014   CREATININE 0.70 02/27/2014   CREATININE 0.78 02/20/2014   CALCIUM 8.7 02/28/2014   CALCIUM 9.6 02/27/2014   CALCIUM 9.1 02/20/2014   LFT  Recent Labs  02/27/14 1210  PROT 7.9  ALBUMIN 3.9  AST 18  ALT 19  ALKPHOS 82  BILITOT 0.7   PT/INR Lab Results  Component Value Date   INR 1.05 02/17/2014   Hepatitis Panel No results found for this basename: HEPBSAG, HCVAB, HEPAIGM, HEPBIGM,  in the last 72 hours C-Diff No components found with this basename: cdiff   Lipase     Component Value Date/Time   LIPASE 51 02/27/2014 1210    Drugs of Abuse     Component Value Date/Time   LABOPIA NONE DETECTED 02/17/2014 2101   COCAINSCRNUR NONE DETECTED 02/17/2014 2101   LABBENZ NONE DETECTED 02/17/2014 2101   AMPHETMU NONE DETECTED 02/17/2014 2101   THCU NONE DETECTED 02/17/2014 2101   LABBARB NONE DETECTED 02/17/2014 2101    Stool studies  02/17/14 Negative giardia/crypto  No Salmonella,Shigella,Campylobacter,Yersinia,or   Organism ID, Bacteria  No E.coli 0157:H7 isolated.     RADIOLOGY STUDIES: 02/17/14  ABDOMEN - 1 VIEW  FINDINGS:  Visualized bowel gas pattern is within normal limits without  evidence of obstruction or ileus. No definite free intraperitoneal  air. No soft tissue mass or abnormal calcification.  Degenerative changes noted within the lower lumbar spine. No acute  osseous abnormality.  IMPRESSION:  Nonobstructive bowel gas pattern with no radiographic evidence of  acute intra-abdominal process identified   02/12/14  CT ABDOMEN AND PELVIS WITH CONTRAST   FINDINGS:  Dependent atelectasis is seen in the lung bases. No pleural or pericardial effusion.  There is minimal intrahepatic biliary  ductal prominence. The common  bile duct, liver, spleen, adrenal glands and pancreas are  unremarkable. Tiny low attenuating lesions in the left kidneys  cannot be definitively characterized but likely represent cysts. The  stomach and small and large bowel appear normal. The appendix is not  visualized and may have been removed. No evidence of inflammatory  process is seen. There is no lymphadenopathy or fluid. No focal bony abnormality is seen.  IMPRESSION:  No acute finding abdomen or pelvis. No finding to explain the patient's symptoms   ENDOSCOPIC STUDIES: EGD 30 plus years ago:  No ulcer per her recall.   IMPRESSION:   *  Nausea and vomiting. CT scan 4/24 days negative, abdominal films 4/29 negative.   ? PUD.  ? Post infectious gastroparesis.   *  Previous diarrhea, resolved.  Negative stool clx and studies as above.     PLAN:     *  EGD today.  D/w Dr Cecile Hearing  02/28/2014, 10:07 AM Pager: [phone]

## 2014-02-28 NOTE — Consult Note (Signed)
Patient seen, examined, and I agree with the above documentation, including the assessment and plan. Nausea and vomiting, patient describes vomiting of undigested food suggestive of outlet obstruction or gastroparesis Previous CT scan within the past 2 weeks negative Resolved diarrhea EGD today The nature of the procedure, as well as the risks, benefits, and alternatives were carefully and thoroughly reviewed with the patient. Ample time for discussion and questions allowed. The patient understood, was satisfied, and agreed to proceed.

## 2014-02-28 NOTE — Progress Notes (Signed)
Patient is pain free post EGD, only complains of slight throat soreness. After procedure pt desires to know when she may have meals or if she is scheduled for further tests.

## 2014-02-28 NOTE — Progress Notes (Signed)
Family Medicine Teaching Service Daily Progress Note Intern Pager: [phone]  Patient name: Gina Wise Medical record number: [id number] Date of birth: 19-Feb-1961 Age: 53 y.o. Gender: female  Primary Care Provider: Liam Graham, MD Consultants: GI Code Status: full  Pt Overview and Major Events to Date:  5/9: admitted, on IVF and schedule antiemetics; morphine for headache, GI consult requested 5/10: overall improving but with small emesis overnight; TSH mildly elevated  Assessment and Plan: Sherman Lipuma is a 53 y.o. female presenting with return of nausea and vomiting in the absence of diarrhea. PMH is significant for hyperthyroidism, HTN, Migraines, Depression & multiple abdominal surgeries (appendectomy, tubal ligation, hysterectomy, inguinal hernia repair)   #FEN/GI: N/V of unknown etiology; DDx broad (see H&P;)  no currentdiarrheal component. Does have extensive hx of abdominal surgeries predisposing to adhesions. Physical exam reassuring, no signs of acute abdomen  - awaiting GI consult, appreciate recs and management; likely EGD or other esophageal / gastric studies -continue NS @ 125 cc/hr; clear liquid diet for now  - ordered for dulcolax PRN given recent constipation - Continue Zofran 8 mg q4 and Protonix 40 mg IV daily scheduled, Phenergan 25 mg q4 PRN - may need to consider repeat imaging to look for abdominal cause and/or consider head CT for NPH, if symptoms worsen or do not improve despite treatment, or if no other cause can be found   # Endo: Hypothyroidism: TSH nml during last admission, now slightly elevated; likely emesis and not keeping down medication vs acute illness causing thyroid abnormality - switch Synthroid to IV for now, 50 mcg daily; consider increase from 88 mcg to 100 mcg PO once tolerating, vs continue 88 mcg and repeat TSH as an outpt  # CV: HTN/tahcycardia: Currently well controlled, normotensive in the ED with some tachycardia. May be related to  dehydration vs anxiety vs strain from emesis; orthostatic vitals negative on admission - Continue HCTZ  - continue to monitor vs per floor protocol   # Psych: Depression: certainly could be contributing to above  - Continue Zoloft  - continue to reassess social situation; reportedly living with roommate   #Neuro: Current headache; hx of migraine headaches, certainly sounds like tension headache 2/2 dehydration; nml neuro exam, no focal deficits; improvement with IVF, Tylenol, s/p morphine 5/9 x1 -could consider CT head for eval of increased ICP if GI w/up neg  -serial neuro exams for changes  -Tylenol PRN, morphine for severe uncontrolled pain  #Prophylaxis: Heparin Subq   Disposition: management as above; discharge planning pending improvement and GI testing / recommendations  Subjective: Pt had small emesis last night / early this morning of yellowish bile, but overall feels some better with IVF and antiemetics. Tolerated a small amount of clear liquids this morning but feels very full on a very small amount and feels like this will "definitely come up, later." Pt has not been seen by GI, yet. Otherwise denies pain other than headache, which is also some better.  Objective: Temp:  [97.3 F (36.3 C)-97.9 F (36.6 C)] 97.4 F (36.3 C) (05/10 0643) Pulse Rate:  [60-83] 70 (05/10 0643) Resp:  [12-20] 18 (05/10 0643) BP: (105-144)/(67-92) 130/72 mmHg (05/10 0643) SpO2:  [98 %-100 %] 100 % (05/10 0643) Weight:  [179 lb (81.194 kg)] 179 lb (81.194 kg) (05/09 1149) Physical Exam: General: adult female lying in bed, initially standing / ambulating, in NAD  Cardiovascular: RRR, no murmurs, rubs or gallops  Respiratory: CTAB, no wheezes, normal WOB  Abdomen: soft, mildly tender  mostly  in epigastric region; non distended, normoactive BS  Extremities: WWP  Skin: no rashes or lesions  Neuro: A&OX3; no gross focal deficit; ambulates without assistance in room  Laboratory:  Recent  Labs Lab 02/27/14 1210  WBC 7.1  HGB 14.5  HCT 44.1  PLT 251    Recent Labs Lab 02/27/14 1210 02/28/14 0435  NA 141 145  K 4.5 3.8  CL 103 109  CO2 23 21  BUN 10 7  CREATININE 0.70 0.66  CALCIUM 9.6 8.7  PROT 7.9  --   BILITOT 0.7  --   ALKPHOS 82  --   ALT 19  --   AST 18  --   GLUCOSE 100* 88   UA 5/9 in ED: WNL except for trace Hb  TSH 5/10: 5.140  Imaging/Diagnostic Tests: N/A  Emmaline Kluver, MD 02/28/2014, 9:22 AM PGY-2, [place] Intern pager: [phone], text pages welcome

## 2014-02-28 NOTE — Progress Notes (Addendum)
[facility] ATTENDING ADMISSION NOTE  Gina Eniola,MD  I have seen and examined this patient, reviewed their chart. I have discussed this patient with the resident. I agree with the resident's findings, assessment and care plan.  53 Y/O F with Pmx GERD, anxiety,hypothyrodism,depression recently d/c from the hospital for N/V for which she had work up done, few days after d/c from the hospital she started having N/V which worsened progressively associated with abdominal pain,she denies any change in her bowel movement,no fever, no change in her diet. She feels like after eating her food stays in her stomach just for a while and then it comes right out.  Filed Vitals:    02/27/14 2212  02/28/14 0643  02/28/14 1148  02/28/14 1151   BP:  129/75  130/72  143/79    Pulse:  60  70   68   Temp:  97.3 F (36.3 C)  97.4 F (36.3 C)  98.2 F (36.8 C)    TempSrc:  Oral  Oral  Oral    Resp:  18  18  16  13   Weight:       SpO2:  100%  100%  100%  100%    Exam:  Gen: Calm in bed.  HEENT: EOMI, PERRLA.  Resp: Air entry equal B/L,no wheezing.  CV: S1 S2 no murmurs.  Abd: Soft, mild epigastric tenderness,BS+ and normal.  Ext: No edema.  A/P: 53 Y/O female with Persistent nausea and vomiting.Gastroparesis vs IBS  S/P stool culture and c diff from last admission but currently not having any diarrhea.  IVF for hydration.  BMET,CBC  Monitor v/s.  Had hysterectomy hence pregnancy unlikely.  Plan for gastroenterologist to see her for further recommendation.  

## 2014-02-28 NOTE — H&P (Signed)
FMTS ATTENDING ADMISSION NOTE  Master Touchet,MD  I have seen and examined this patient, reviewed their chart. I have discussed this patient with the resident. I agree with the resident's findings, assessment and care plan.  53 Y/O F with Pmx GERD, anxiety,hypothyrodism,depression recently d/c from the hospital for N/V for which she had work up done, few days after d/c from the hospital she started having N/V which worsened progressively associated with abdominal pain,she denies any change in her bowel movement,no fever, no change in her diet. She feels like after eating her food stays in her stomach just for a while and then it comes right out.  Filed Vitals:    02/27/14 2212  02/28/14 0643  02/28/14 1148  02/28/14 1151   BP:  129/75  130/72  143/79    Pulse:  60  70   68   Temp:  97.3 F (36.3 C)  97.4 F (36.3 C)  98.2 F (36.8 C)    TempSrc:  Oral  Oral  Oral    Resp:  18  18  16  13    Weight:       SpO2:  100%  100%  100%  100%    Exam:  Gen: Calm in bed.  HEENT: EOMI, PERRLA.  Resp: Air entry equal B/L,no wheezing.  CV: S1 S2 no murmurs.  Abd: Soft, mild epigastric tenderness,BS+ and normal.  Ext: No edema.  A/P: 53 Y/O female with Persistent nausea and vomiting.Gastroparesis vs IBS  S/P stool culture and c diff from last admission but currently not having any diarrhea.  IVF for hydration.  BMET,CBC  Monitor v/s.  Had hysterectomy hence pregnancy unlikely.  Plan for gastroenterologist to see her for further recommendation.

## 2014-02-28 NOTE — Progress Notes (Signed)
UR Completed.  Vergie Living [id number] 02/28/2014

## 2014-02-28 NOTE — Progress Notes (Signed)
Pt restless, complaining of headache and tearful. Dr called and received orders pain med orders

## 2014-03-01 ENCOUNTER — Encounter (HOSPITAL_COMMUNITY): Payer: Self-pay | Admitting: Internal Medicine

## 2014-03-01 ENCOUNTER — Ambulatory Visit: Payer: 59 | Admitting: Family Medicine

## 2014-03-01 DIAGNOSIS — R1011 Right upper quadrant pain: Secondary | ICD-10-CM

## 2014-03-01 DIAGNOSIS — I10 Essential (primary) hypertension: Secondary | ICD-10-CM

## 2014-03-01 MED ORDER — PANTOPRAZOLE SODIUM 40 MG PO TBEC
40.0000 mg | DELAYED_RELEASE_TABLET | Freq: Two times a day (BID) | ORAL | Status: DC
  Administered 2014-03-01 – 2014-03-02 (×3): 40 mg via ORAL
  Filled 2014-03-01 (×3): qty 1

## 2014-03-01 MED ORDER — PANTOPRAZOLE SODIUM 40 MG PO TBEC
40.0000 mg | DELAYED_RELEASE_TABLET | Freq: Every day | ORAL | Status: DC
  Administered 2014-03-01: 40 mg via ORAL
  Filled 2014-03-01: qty 1

## 2014-03-01 NOTE — Progress Notes (Signed)
FMTS Attending Daily Note:  Annabell Sabal MD  [phone] pager  Family Practice pager:  [phone] I have seen and examined this patient and have reviewed their chart. I have discussed this patient with the resident. I agree with the resident's findings, assessment and care plan.  Additionally:  Still with globus sensation and nausea. Schatzki's ring noted.  Possibily also gastroparesis, with unclear etiology. Starting reglan today.  Will look for improvement this PM and tomorrow AM breakfast.   Hiatal hernia plus duodenal inflammation noted on endoscopy.  Increasing Protonix to BID for short term to see if this helps Watch overnight and re-eval in AM.   Alveda Reasons, MD 03/01/2014 4:20 PM

## 2014-03-01 NOTE — Progress Notes (Signed)
Family Medicine Teaching Service Daily Progress Note Intern Pager: [phone]  Patient name: Gina Wise Medical record number: [id number] Date of birth: November 27, 1960 Age: 53 y.o. Gender: female  Primary Care Provider: Liam Graham, MD Consultants: GI Code Status: full  Pt Overview and Major Events to Date:  5/9: admitted, on IVF and schedule antiemetics; morphine for headache, GI consult requested 5/10: overall improving but with small emesis overnight; TSH mildly elevated 5/11: EGD yesterday with schatzki's ring; biopsies sent; trial of bland diet   Assessment and Plan: Gina Wise is a 53 y.o. female presenting with return of nausea and vomiting in the absence of diarrhea. PMH is significant for hyperthyroidism, HTN, Migraines, Depression & multiple abdominal surgeries (appendectomy, tubal ligation, hysterectomy, inguinal hernia repair)   #FEN/GI: N/V of unknown etiology; DDx unclear, possibly related to EGD findings of Schatzki's ring and sml hiatal hernia vs delayed emptying. Biopsies pending. No current diarrheal component.  Does have extensive hx of abdominal surgeries predisposing to adhesions. Physical exam remains reassuring, no signs of acute abdomen  - Gi onboard, apprec recs  -continue NS @ 125 cc/hr; trial of bland diet today - ordered for dulcolax PRN given recent constipation - Continue Zofran 8 mg q4 and Protonix 40 mg IV daily scheduled, Phenergan 25 mg q4 PRN - serial abdominal exams  # Endo: Hypothyroidism: TSH nml during last admission, now slightly elevated; likely emesis and not keeping down medication vs acute illness causing thyroid abnormality - Synthroid IV for now, 50 mcg daily; consider increase from 88 mcg to 100 mcg PO once tolerating, vs continue 88 mcg and repeat TSH as an outpt - pt attempting orals today  # CV: HTN/tahcycardia: Currently well controlled, normotensive in the ED with some tachycardia. May be related to dehydration vs anxiety vs strain from  emesis; orthostatic vitals negative on admission - Continue HCTZ  - continue to monitor vs per floor protocol   # Psych: Depression: certainly could be contributing to above  - Continue Zoloft  - continue to reassess social situation; reportedly living with roommate   #Neuro: Current headache; hx of migraine headaches, resolved this morning -would hold on CT head for eval of increased ICP -Tylenol PRN, morphine for severe uncontrolled pain  #Prophylaxis: Heparin Subq   Disposition: management as above; discharge planning pending improvement and GI testing / recommendations; likely outpt GI follow up  Subjective: Pt feeling much better this morning; ready to try solids  Objective: Temp:  [97.4 F (36.3 C)-98.2 F (36.8 C)] 97.8 F (36.6 C) (05/11 0626) Pulse Rate:  [68-129] 76 (05/11 0626) Resp:  [11-30] 16 (05/11 0626) BP: (110-176)/(60-138) 119/69 mmHg (05/11 0626) SpO2:  [98 %-100 %] 99 % (05/11 0626) Physical Exam: General: adult female lying in bed, in NAD  Cardiovascular: RRR, no murmurs, rubs or gallops  Respiratory: CTAB, no wheezes, normal WOB  Abdomen: soft, mildly tender mostly  in epigastric region; non distended, normoactive BS  Extremities: WWP  Skin: no rashes or lesions  Neuro: A&OX3; no gross focal deficit; ambulates without assistance in room  Laboratory:  Recent Labs Lab 02/27/14 1210  WBC 7.1  HGB 14.5  HCT 44.1  PLT 251    Recent Labs Lab 02/27/14 1210 02/28/14 0435  NA 141 145  K 4.5 3.8  CL 103 109  CO2 23 21  BUN 10 7  CREATININE 0.70 0.66  CALCIUM 9.6 8.7  PROT 7.9  --   BILITOT 0.7  --   ALKPHOS 82  --  ALT 19  --   AST 18  --   GLUCOSE 100* 88   UA 5/9 in ED: WNL except for trace Hb  TSH 5/10: 5.140  Imaging/Diagnostic Tests: EGD 02/28/14 RECOMMENDATIONS:  1. Await biopsy results  2. Follow-up of helicobacter pylori status, treat if indicated  3. Trial of metoclopramide before meals and at bedtime vs. gastric   emptying study 1st  4. Repeat EGD can be performed as an outpatient for dilation of  Schatzki's ring, if needed for esophageal dysphagia. Any future GI  procedures will need to be performed with monitored anesthesia care   Langston Masker, MD 03/01/2014, 9:46 AM PGY-1, [place] Intern pager: [phone], text pages welcome

## 2014-03-01 NOTE — Progress Notes (Signed)
Pt complaining of soreness at IV site and requesting that new site be accessed. Pt states she is a "hard stick" and prefers IV team attempt new access rather than bedside nursing staff. IV team paged. Will continue to monitor.

## 2014-03-01 NOTE — Progress Notes (Signed)
          Daily Rounding Note  03/01/2014, 9:43 AM  LOS: 2 days   SUBJECTIVE:       Slight sore throat post EGD.  Still a globus sensation at level of upper eophagus.  Last Zofran was at 0500 today.  No nausea, tolerated clears.  Diet advanced to soft.  On once daily IV Protonix, QID Reglan 10 mg IV,    OBJECTIVE:         Vital signs in last 24 hours:    Temp:  [97.4 F (36.3 C)-98.2 F (36.8 C)] 97.8 F (36.6 C) (05/11 0626) Pulse Rate:  [68-129] 76 (05/11 0626) Resp:  [11-30] 16 (05/11 0626) BP: (110-176)/(60-138) 119/69 mmHg (05/11 0626) SpO2:  [98 %-100 %] 99 % (05/11 0626) Last BM Date: 02/24/14 General: looks well   Heart: RRR Chest: Nt, clear, no labored breathing Abdomen: soft, active BS, NT  Extremities: no CCE Neuro/Psych:  Oriented x 3, no tremor.  Seems slightly anxious.   Intake/Output from previous day: 05/10 0701 - 05/11 0700 In: 720 [P.O.:720] Out: 0   Intake/Output this shift:    Lab Results:  Recent Labs  02/27/14 1210  WBC 7.1  HGB 14.5  HCT 44.1  PLT 251   BMET  Recent Labs  02/27/14 1210 02/28/14 0435  NA 141 145  K 4.5 3.8  CL 103 109  CO2 23 21  GLUCOSE 100* 88  BUN 10 7  CREATININE 0.70 0.66  CALCIUM 9.6 8.7   LFT  Recent Labs  02/27/14 1210  PROT 7.9  ALBUMIN 3.9  AST 18  ALT 19  ALKPHOS 82  BILITOT 0.7   PT/INR No results found for this basename: LABPROT, INR,  in the last 72 hours Hepatitis Panel No results found for this basename: HEPBSAG, HCVAB, HEPAIGM, HEPBIGM,  in the last 72 hours  Studies/Results: No results found.  ASSESMENT:   * Nausea and vomiting. CT scan 4/24 days negative, abdominal films 4/29 negative.  ? PUD. ? Post infectious gastroparesis.  EGD 5/10: Schattzki ring (not dilated). Bulbar duodenitis.  * Previous diarrhea, resolved. Negative stool clx and studies as above.     PLAN   *  Switch to po Protonix, leave IV reglan in place but  eventually convert to po.  See how she does with soft diet.  The vomiting generally occurs 10 to 12 hours post ingestion.  *  Follow up clo test.     Vena Rua  03/01/2014, 9:43 AM Pager: [phone]  ________________________________________________________________________  Velora Heckler GI MD note:  I personally examined the patient, reviewed the data and agree with the assessment and plan described above.  Still unclear etiology despite EGD, [place], CT, extensive labwork.  If EGD biopsies show H. Pylori, will treat with appropriate antibiotics.  If not, then further testing pending her clinical course.  She is advancing diet today to softs.     Owens Loffler, MD [facility] Gastroenterology Pager [phone]

## 2014-03-01 NOTE — Progress Notes (Signed)
INITIAL NUTRITION ASSESSMENT  DOCUMENTATION CODES Per approved criteria  -Not Applicable   INTERVENTION: Encouraged pt to chew soft foods well and to sip liquids while eating.  Supplement diet as needed.  NUTRITION DIAGNOSIS: Altered GI function related to Schatzki's ring as evidenced by N/V.   Goal: Pt to meet >/= 90% of their estimated nutrition needs   Monitor:  Diet tolerance, PO intake, weight trend  Reason for Assessment: Pt identified as at nutrition risk on the Malnutrition Screen Tool  53 y.o. female  Admitting Dx: Intractable nausea and vomiting  ASSESSMENT: Pt reports that she was weighing 200 lb and started to lose weight due to changing her lifestyle, she dropped to 184 lb. Pt then got sick starting 3 weeks ago which included diarrhea and N/V. Pt had EGD which revealed small hiatal hernia and Schatzki's ring. Pt started on soft diet for lunch, pt seems apprehensive (states she is going to stay positive but feels that she will need dilatation prior to d/c. Pt also on scheduled zofran, protonix, and reglan.  Nutrition-focused physical exam WNL.   Height: Ht Readings from Last 1 Encounters:  02/23/14 5\' 6"  (1.676 m)    Weight: Wt Readings from Last 1 Encounters:  02/27/14 179 lb (81.194 kg)    Ideal Body Weight: 59 kg  % Ideal Body Weight: 137%  Wt Readings from Last 10 Encounters:  02/27/14 179 lb (81.194 kg)  02/27/14 179 lb (81.194 kg)  02/25/14 180 lb (81.647 kg)  02/23/14 179 lb 12.8 oz (81.557 kg)  02/17/14 175 lb 11.3 oz (79.7 kg)  02/15/14 178 lb (80.74 kg)  02/10/14 174 lb (78.926 kg)  02/02/14 187 lb (84.823 kg)  01/29/14 182 lb (82.555 kg)  08/25/13 182 lb (82.555 kg)    Usual Body Weight: 200 lb before pt changed her lifestyle and lost weight - 184 lb after weight loss  % Usual Body Weight: 97%  BMI:  Body mass index is 28.91 kg/(m^2).  Estimated Nutritional Needs: Kcal: 1700-1900 Protein: 75-85 grams Fluid: > 1.7 L/day  Skin:  intact  Diet Order: Criss Rosales  EDUCATION NEEDS: -Education needs addressed   Intake/Output Summary (Last 24 hours) at 03/01/14 1017 Last data filed at 03/01/14 0900  Gross per 24 hour  Intake    840 ml  Output      0 ml  Net    840 ml    Last BM: 5/6   Labs:   Recent Labs Lab 02/27/14 1210 02/28/14 0435  NA 141 145  K 4.5 3.8  CL 103 109  CO2 23 21  BUN 10 7  CREATININE 0.70 0.66  CALCIUM 9.6 8.7  GLUCOSE 100* 88    CBG (last 3)   Recent Labs  02/28/14 1923  GLUCAP 94    Scheduled Meds: . heparin  5,000 Units Subcutaneous 3 times per day  . levothyroxine  50 mcg Intravenous Daily  . metoCLOPramide (REGLAN) injection  10 mg Intravenous 4 times per day  . ondansetron (ZOFRAN) IV  8 mg Intravenous 4 times per day  . pantoprazole  40 mg Oral Q0600  . sertraline  50 mg Oral Daily    Continuous Infusions: . sodium chloride 125 mL/hr at 02/28/14 0550  . sodium chloride 20 mL/hr at 02/28/14 1442    Past Medical History  Diagnosis Date  . Hypothyroid   . Overweight   . Hypertension   . Peri-menopause   . Anemia   . High cholesterol   . Chronic bronchitis     "  get it q yr; in the spring time" (02/17/2014)  . Multiple environmental allergies   . Migraines     "maybe a couple/yr" (02/17/2014)  . Depression     "have taken zoloft since 1996" (02/17/2014)    Past Surgical History  Procedure Laterality Date  . Shoulder open rotator cuff repair Left 1995  . Shoulder surgery Left 1997    "cartilage"  . Exploratory laparotomy  1987  . Inguinal hernia repair Right ~ 2003  . Appendectomy  1987  . Tubal ligation  1998    laparoscopic  . Vaginal hysterectomy  04/08/2012    menorrhagia  . Tonsillectomy and adenoidectomy  1976  . Foot surgery Right ~1980    "don't know what they did; had to take something out"  . Esophagogastroduodenoscopy N/A 02/28/2014    Procedure: ESOPHAGOGASTRODUODENOSCOPY (EGD);  Surgeon: Jerene Bears, MD;  Location: [facility] ENDOSCOPY;   Service: Endoscopy;  Laterality: N/A;    Maylon Peppers RD, [place], [place] Pager [phone] After Hours Pager

## 2014-03-02 DIAGNOSIS — E785 Hyperlipidemia, unspecified: Secondary | ICD-10-CM

## 2014-03-02 MED ORDER — POLYETHYLENE GLYCOL 3350 17 G PO PACK
17.0000 g | PACK | Freq: Every day | ORAL | Status: DC
Start: 2014-03-02 — End: 2014-03-03
  Administered 2014-03-02: 17 g via ORAL
  Filled 2014-03-02 (×2): qty 1

## 2014-03-02 MED ORDER — SODIUM CHLORIDE 0.9 % IV SOLN: INTRAVENOUS | Status: DC

## 2014-03-02 MED ORDER — TRAMADOL HCL 50 MG PO TABS
50.0000 mg | ORAL_TABLET | Freq: Four times a day (QID) | ORAL | Status: DC | PRN
  Administered 2014-03-02 (×2): 50 mg via ORAL
  Filled 2014-03-02 (×2): qty 1

## 2014-03-02 MED ORDER — LEVOTHYROXINE SODIUM 88 MCG PO TABS
88.0000 ug | ORAL_TABLET | Freq: Every day | ORAL | Status: DC
  Administered 2014-03-02 – 2014-03-03 (×2): 88 ug via ORAL
  Filled 2014-03-02 (×4): qty 1

## 2014-03-02 MED ORDER — DOCUSATE SODIUM 100 MG PO CAPS
100.0000 mg | ORAL_CAPSULE | Freq: Every day | ORAL | Status: DC
  Administered 2014-03-02 – 2014-03-03 (×2): 100 mg via ORAL
  Filled 2014-03-02 (×2): qty 1

## 2014-03-02 NOTE — Discharge Summary (Signed)
[facility] Discharge Summary  Patient name: Gina Wise Medical record number: [id number] Date of birth: 11/29/1960 Age: 53 y.o. Gender: female Date of Admission: 02/27/2014  Date of Discharge: 03/03/14 Admitting Physician: Andrena Mews, MD  Primary Care Provider: Liam Graham, MD Consultants: GI  Indication for Hospitalization: n/v  Discharge Diagnoses/Problem List:  -nausea/vomiting -schatzki's ring -hiatal hernia -hypothyroidism -HTN -depression -hx of migraine headaches  Disposition: home  Discharge Condition: improved  Discharge Exam:  BP 134/73  Pulse 83  Temp(Src) 97.4 F (36.3 C) (Oral)  Resp 18  Wt 179 lb (81.194 kg)  SpO2 100%  LMP 04/03/2012\ General: adult female lying in bed, in NAD  Cardiovascular: RRR, no murmurs, rubs or gallops  Respiratory: CTAB, no wheezes, normal WOB  Abdomen: soft, mildly tender mostly in epigastric region and RUQ; non distended, normoactive BS.  Extremities: no edema/cyanosis. WWP  Skin: no rashes or lesions  Neuro: A&OX3; no gross focal deficits  Brief Hospital Course:  Joliana Claflin is a 53 y.o. female presenting with return of nausea and vomiting in the absence of diarrhea. PMH is significant for hyperthyroidism, HTN, Migraines, Depression & multiple abdominal surgeries (appendectomy, tubal ligation, hysterectomy, inguinal hernia repair)   #FEN/GI: Pt presenting with return of emesis after recent hospital discharge (admission from April 29-May 2). In the ED pt given IVF and antiemetics (reglan, benadryl, decadron) with minimal to no relief. U/A with trace hemoglobin CBC without WBC CMET without metabolic derrangement.Of note KUB 1 week ago with no obstructive component. CT ab/pelvis with no volvulus or SBO. WBC initially elevated. CMET and lipase at that time wnl. U/A without evidence of UTI. C diff negative, though recently completed a Z pack. UDS negative. TSH within normal limits. During her  previous admission she was started on IV medications including cipro and flagyl to cover gut bacteria but these were d/c'd on discharge. Pt started on zofran, phenergan. GI consulted. EGD performed with findings of Schatzki's ring and sml hiatal hernia. Biopsies sent which revealed neg hpylori testing. HIDA performed and was nml. Diet advanced with pt tolerating full diet on day of discharge with still some complaints of post-prandial fullness. Possible delayed emptying s/p viral gastro illness. Recommended d/c regimen on tid reglan, daily PPI and miralax for constipation. Will f/up with Dr. Hilarie Fredrickson in 5-6 weeks.  # Endo: Hypothyroidism: TSH nml during last admission (1.170), repeat slightly elevated (5.14);Felt to be likely 2/2 emesis and not keeping down medication vs acute illness causing thyroid abnormality. Initially placed on IV 3mcg and transitioned back to home dose 66mcg. Recommend repeat TSH as an outpt   # CV: HTN/tachycardia: Pt initially presenting with tachycardia but normotensive in the ED. May be related to dehydration vs anxiety vs strain from emesis. Orthostatic vitals negative on admission. Tachycardia resolved after rehydration. Cont'd on HCTZ.  # Psych: Depression: Pt with anxious affect. Certainly could be contributing to GI issues. Continued on zoloft. Could consider increasing as an outpt. Would recommend close follow up for titration  #Neuro: Pt with hx of migraine headaches. Presented with tension type headache resolved with rehydration. No indication for CT head.    Issues for Follow Up:  1. F/up with Dr. Hilarie Fredrickson in 5-6 weeks 2. Mental coping with diagnosis, possible need for increase zoloft 3. Repeat TSH as outpt  Significant Procedures: EGD with biopsies   Significant Labs and Imaging:   Recent Labs Lab 02/27/14 1210  WBC 7.1  HGB 14.5  HCT 44.1  PLT 251  Recent Labs Lab 02/27/14 1210 02/28/14 0435  NA 141 145  K 4.5 3.8  CL 103 109  CO2 23 21   GLUCOSE 100* 88  BUN 10 7  CREATININE 0.70 0.66  CALCIUM 9.6 8.7  ALKPHOS 82  --   AST 18  --   ALT 19  --   ALBUMIN 3.9  --    EGD 5/10- Schatzki's ring, bulbar duodenitis  HIDA 5/13- nml  Results/Tests Pending at Time of Discharge: none  Discharge Medications:    Medication List    STOP taking these medications       omeprazole 40 MG capsule  Commonly known as:  PRILOSEC      TAKE these medications       fluticasone 50 MCG/ACT nasal spray  Commonly known as:  FLONASE  Place 1 spray into both nostrils daily.     hydrochlorothiazide 25 MG tablet  Commonly known as:  HYDRODIURIL  Take 0.5 tablets (12.5 mg total) by mouth daily.     levothyroxine 88 MCG tablet  Commonly known as:  SYNTHROID, LEVOTHROID  Take 1 tablet (88 mcg total) by mouth daily before breakfast.     metoCLOPramide 10 MG tablet  Commonly known as:  REGLAN  Take 1 tablet (10 mg total) by mouth every 6 (six) hours. Take 1 tab 3-4 times a day     ondansetron 8 MG disintegrating tablet  Commonly known as:  ZOFRAN-ODT  Take 1 tablet (8 mg total) by mouth every 8 (eight) hours as needed for nausea or vomiting.     oxyCODONE-acetaminophen 5-325 MG per tablet  Commonly known as:  PERCOCET  Take 1-2 tablets by mouth every 6 (six) hours as needed.     pantoprazole 40 MG tablet  Commonly known as:  PROTONIX  Take 1 tablet (40 mg total) by mouth daily.     polyethylene glycol packet  Commonly known as:  MIRALAX / GLYCOLAX  Take 17 g by mouth daily.     sertraline 100 MG tablet  Commonly known as:  ZOLOFT  Take 50 mg by mouth daily.     SUMAtriptan 100 MG tablet  Commonly known as:  IMITREX  Take 100 mg by mouth every 2 (two) hours as needed for migraine.        Discharge Instructions: Please refer to Patient Instructions section of EMR for full details.  Patient was counseled important signs and symptoms that should prompt return to medical care, changes in medications, dietary instructions,  activity restrictions, and follow up appointments.   Follow-Up Appointments: Follow-up Information   Schedule an appointment as soon as possible for a visit with PYRTLE, Lajuan Lines, MD. (For hospital follow up. Call office if they do not call you in 1-2 days)    Specialty:  Gastroenterology   Contact information:   [address] [place] [address] [phone]       Follow up with Liam Graham, MD. Schedule an appointment as soon as possible for a visit in 1 week. (For hospital follow up)    Specialty:  Family Medicine   Contact information:   [place] [address] [phone]       Langston Masker, MD 03/03/2014, 9:57 PM PGY-1, [facility]

## 2014-03-02 NOTE — Progress Notes (Signed)
FMTS Attending Daily Note:  Annabell Sabal MD  [phone] pager  Family Practice pager:  [phone] I have discussed this patient with the resident Dr. Skeet Simmer.  I agree with their findings, assessment, and care plan

## 2014-03-02 NOTE — Progress Notes (Signed)
Daily Rounding Note  03/02/2014, 11:16 AM  LOS: 3 days   SUBJECTIVE:       Bad night.  Did not vomit but kept tasting food several hours after ingestion.  Epigastric pain with meal.  Tolerated minimal breakfast.  She is not feeling well   OBJECTIVE:         Vital signs in last 24 hours:    Temp:  [97.8 F (36.6 C)-98 F (36.7 C)] 97.8 F (36.6 C) (05/12 0502) Pulse Rate:  [66-69] 69 (05/12 0502) Resp:  [16] 16 (05/12 0502) BP: (113-126)/(60-66) 113/60 mmHg (05/12 0502) SpO2:  [95 %-100 %] 96 % (05/12 0502) Last BM Date: 02/24/14 General: looks depressed   Heart: RRR Chest: clear  Abdomen: soft, NT, BS hypoactive  Extremities: no CCE Neuro/Psych:  Depressed, flat affect.  Fully alert.   Intake/Output from previous day: 05/11 0701 - 05/12 0700 In: 840 [P.O.:840] Out: -   Intake/Output this shift: Total I/O In: 240 [P.O.:240] Out: -   Lab Results:  Recent Labs  02/27/14 1210  WBC 7.1  HGB 14.5  HCT 44.1  PLT 251   BMET  Recent Labs  02/27/14 1210 02/28/14 0435  NA 141 145  K 4.5 3.8  CL 103 109  CO2 23 21  GLUCOSE 100* 88  BUN 10 7  CREATININE 0.70 0.66  CALCIUM 9.6 8.7   LFT  Recent Labs  02/27/14 1210  PROT 7.9  ALBUMIN 3.9  AST 18  ALT 19  ALKPHOS 82  BILITOT 0.7   Scheduled Meds: . docusate sodium  100 mg Oral Daily  . heparin  5,000 Units Subcutaneous 3 times per day  . levothyroxine  88 mcg Oral QAC breakfast  . metoCLOPramide (REGLAN) injection  10 mg Intravenous 4 times per day  . ondansetron (ZOFRAN) IV  8 mg Intravenous 4 times per day  . pantoprazole  40 mg Oral BID  . polyethylene glycol  17 g Oral Daily  . sertraline  50 mg Oral Daily   Continuous Infusions: . sodium chloride     PRN Meds:.acetaminophen, traMADol   ASSESMENT:   * Nausea and vomiting. CT scan 4/24 days negative, abdominal films 4/29 negative. [place] 01/2014 was normal ? PUD. ? Post infectious  gastroparesis.  EGD 5/10: Schattzki ring (not dilated). Bulbar duodenitis.  sxs better but persist despite qid Reglan, BID Protonix, QID Zofran.  Wonder about biliary disease.  Only abnormality was minimal intrahepatic biliary ductal prominence on CT, has not had an ultrasound abdomen, LFTs entirely normal.   HIDA?    * Previous diarrhea, resolved. Negative stool clx and studies as above. On Miralax.     PLAN   *  Still waiting on path report.  Dr Donato Heinz is reading, hopefully will have results later today unless special stains needed.  *  Discussed HIDA with nuc med and due to her having ingested small amounts, test would need to be done in AM       Vena Rua  03/02/2014, 11:16 AM Pager: [phone]   ________________________________________________________________________  Velora Heckler GI MD note:  I personally examined the patient, reviewed the data and agree with the assessment and plan described above.  Still unclear etiology of her abd pain, nausea.  CT scan, [place], labs, stool tests, EGD all in past 2-3 weeks have all been essentially unrevealing.  She is still very bothered and I suggested next test to be HIDA scan to check  for GB dysfunction.  Will order for AM (already ate today).   Owens Loffler, MD [name] Gastroenterology Pager [phone]

## 2014-03-02 NOTE — Progress Notes (Signed)
Family Medicine Teaching Service Daily Progress Note Intern Pager: [phone]  Patient name: Gina Wise Medical record number: [id number] Date of birth: 30-Jul-1961 Age: 53 y.o. Gender: female  Primary Care Provider: Liam Graham, MD Consultants: GI Code Status: full  Pt Overview and Major Events to Date:  5/9: admitted, on IVF and schedule antiemetics; morphine for headache, GI consult requested 5/10: overall improving but with small emesis overnight; TSH mildly elevated 5/11: EGD yesterday with schatzki's ring; biopsies sent; trial of bland diet  5/12: awaiting results of biospies, increased Bowel regimen  Assessment and Plan: Bryla Burek is a 53 y.o. female presenting with return of nausea and vomiting in the absence of diarrhea. PMH is significant for hyperthyroidism, HTN, Migraines, Depression & multiple abdominal surgeries (appendectomy, tubal ligation, hysterectomy, inguinal hernia repair)   #FEN/GI: N/V of unknown etiology; DDx unclear, possibly related to EGD findings of Schatzki's ring and sml hiatal hernia vs delayed emptying. Biopsies pending. Still attesting to bloating and postprandial fullness  - Gi onboard, apprec recs  - cont bland diet as pt tolerates - miralax, colace prn for constipation - Continue Zofran 8 mg q4 IV and Protonix 40 mg po BID, Reglan 10mg  qid IV - serial abdominal exams - pain control primarily with tylenol, tramadol for break through (would avoid narcotics if at all possible given slowed gastric motility)   # Endo: Hypothyroidism: TSH nml during last admission, now slightly elevated; likely emesis and not keeping down medication vs acute illness causing thyroid abnormality - transition back to home dose 34mcg - repeat TSH as an outpt  # CV: HTN/tahcycardia: Currently well controlled, normotensive in the ED with some tachycardia. May be related to dehydration vs anxiety vs strain from emesis; orthostatic vitals negative on admission - Continue HCTZ   - continue to monitor vs per floor protocol   # Psych: Depression: certainly could be contributing to above  - Continue Zoloft, would consider increase as outpt - continue to reassess social situation; reportedly living with roommate   #Neuro: hx of migraine headaches, resolved  -would hold on CT head for eval of increased ICP -Tylenol PRN, tramadol for severe uncontrolled pain  Prophylaxis: Heparin Subq   Disposition: management as above; discharge planning pending improvement and GI testing / recommendations; likely outpt GI follow up  Subjective: Felt better yesterday evening with trial of food but having increased discomfort following foods; having sml stools daily  Objective: Temp:  [97.8 F (36.6 C)-98 F (36.7 C)] 97.8 F (36.6 C) (05/12 0502) Pulse Rate:  [66-69] 69 (05/12 0502) Resp:  [16] 16 (05/12 0502) BP: (113-126)/(60-66) 113/60 mmHg (05/12 0502) SpO2:  [95 %-100 %] 96 % (05/12 0502) Physical Exam: General: adult female lying in bed, in NAD, meek Cardiovascular: RRR, no murmurs, rubs or gallops  Respiratory: CTAB, no wheezes, normal WOB  Abdomen: soft, mildly tender mostly  in epigastric region; non distended, normoactive BS, no palpable fluid wave Extremities: WWP  Skin: no rashes or lesions  Neuro: A&OX3; no gross focal deficit; ambulates without assistance in room  Laboratory:  Recent Labs Lab 02/27/14 1210  WBC 7.1  HGB 14.5  HCT 44.1  PLT 251    Recent Labs Lab 02/27/14 1210 02/28/14 0435  NA 141 145  K 4.5 3.8  CL 103 109  CO2 23 21  BUN 10 7  CREATININE 0.70 0.66  CALCIUM 9.6 8.7  PROT 7.9  --   BILITOT 0.7  --   ALKPHOS 82  --   ALT 19  --  AST 18  --   GLUCOSE 100* 88   UA 5/9 in ED: WNL except for trace Hb  TSH 5/10: 5.140  Imaging/Diagnostic Tests: EGD 02/28/14 RECOMMENDATIONS:  1. Await biopsy results  2. Follow-up of helicobacter pylori status, treat if indicated  3. Trial of metoclopramide before meals and at  bedtime vs. gastric  emptying study 1st  4. Repeat EGD can be performed as an outpatient for dilation of  Schatzki's ring, if needed for esophageal dysphagia. Any future GI  procedures will need to be performed with monitored anesthesia care   Langston Masker, MD 03/02/2014, 8:14 AM PGY-1, [place] Intern pager: [phone], text pages welcome

## 2014-03-03 ENCOUNTER — Telehealth: Payer: Self-pay

## 2014-03-03 ENCOUNTER — Inpatient Hospital Stay (HOSPITAL_COMMUNITY): Payer: 59

## 2014-03-03 MED ORDER — STERILE WATER FOR INJECTION IJ SOLN
INTRAMUSCULAR | Status: AC
  Administered 2014-03-03: 10 mL
  Filled 2014-03-03: qty 10

## 2014-03-03 MED ORDER — POLYETHYLENE GLYCOL 3350 17 G PO PACK: 17.0000 g | PACK | Freq: Every day | ORAL | Status: DC

## 2014-03-03 MED ORDER — TECHNETIUM TC 99M MEBROFENIN IV KIT: 5.0000 | PACK | Freq: Once | INTRAVENOUS | Status: DC | PRN

## 2014-03-03 MED ORDER — METOCLOPRAMIDE HCL 10 MG PO TABS
10.0000 mg | ORAL_TABLET | Freq: Four times a day (QID) | ORAL | Status: DC
  Administered 2014-03-03: 10 mg via ORAL
  Filled 2014-03-03: qty 1

## 2014-03-03 MED ORDER — PANTOPRAZOLE SODIUM 40 MG PO TBEC: 40.0000 mg | DELAYED_RELEASE_TABLET | Freq: Every day | ORAL | Status: DC

## 2014-03-03 MED ORDER — PANTOPRAZOLE SODIUM 40 MG PO TBEC
40.0000 mg | DELAYED_RELEASE_TABLET | Freq: Every day | ORAL | Status: DC
  Administered 2014-03-03: 40 mg via ORAL
  Filled 2014-03-03: qty 1

## 2014-03-03 MED ORDER — SINCALIDE 5 MCG IJ SOLR
0.0200 ug/kg | Freq: Once | INTRAMUSCULAR | Status: AC
  Administered 2014-03-03: 1.6 ug via INTRAVENOUS
  Filled 2014-03-03: qty 5

## 2014-03-03 MED ORDER — ONDANSETRON 8 MG PO TBDP
8.0000 mg | ORAL_TABLET | Freq: Three times a day (TID) | ORAL | Status: DC | PRN
  Filled 2014-03-03: qty 1

## 2014-03-03 MED ORDER — SINCALIDE 5 MCG IJ SOLR
INTRAMUSCULAR | Status: AC
  Administered 2014-03-03: 1.6 ug via INTRAVENOUS
  Filled 2014-03-03: qty 5

## 2014-03-03 MED ORDER — ONDANSETRON 8 MG PO TBDP: 8.0000 mg | ORAL_TABLET | Freq: Three times a day (TID) | ORAL | Status: DC | PRN

## 2014-03-03 MED ORDER — METOCLOPRAMIDE HCL 10 MG PO TABS: 10.0000 mg | ORAL_TABLET | Freq: Four times a day (QID) | ORAL | Status: DC

## 2014-03-03 NOTE — Care Management Note (Signed)
CARE MANAGEMENT NOTE 03/03/2014  Patient:  Gina Wise, Gina Wise   Account Number:  [id number]  Date Initiated:  03/03/2014  Documentation initiated by:  Ricki Miller  Subjective/Objective Assessment:   53 yr old female admitted with nausea and vomiting.     Action/Plan:   Case manager received order concerning patient's frequent admissions. patient has a PCP, has insurance coverage and transportation, CM spoke with patient, states she comes in only when she has to. Wants to get better to return to work.   Anticipated DC Date:  03/04/2014   Anticipated DC Plan:  [place]  CM consult      PAC Choice  NA   Choice offered to / List presented to:  NA   DME arranged  NA      DME agency  NA     [place] arranged  NA      [place] agency  NA   Status of service:  Completed, signed off Medicare Important Message given?   (If response is "NO", the following Medicare IM given date fields will be blank) Date Medicare IM given:   Date Additional Medicare IM given:    Discharge Disposition:  [place]

## 2014-03-03 NOTE — Progress Notes (Addendum)
          Daily Rounding Note  03/03/2014, 9:07 AM  LOS: 4 days   SUBJECTIVE:       Not eating a lot.  Less sense of gastric emptying delay but still discomfort in epigastrium. Several stools after Miralax yesterday.   OBJECTIVE:         Vital signs in last 24 hours:    Temp:  [97.8 F (36.6 C)-98.1 F (36.7 C)] 97.8 F (36.6 C) (05/12 2143) Pulse Rate:  [67-72] 67 (05/12 2143) Resp:  [16-18] 18 (05/12 2143) BP: (119-125)/(60-72) 125/72 mmHg (05/12 2143) SpO2:  [99 %-100 %] 99 % (05/12 2143) Last BM Date: 02/24/14 General: looks better, less flat   Heart: RRR Chest: clear bil. Abdomen: soft, ND, NT, active BS  Extremities: no CCE Neuro/Psych:  Cooperative, pleasant, relaxed. No confusion.   Intake/Output from previous day: 05/12 0701 - 05/13 0700 In: 1130 [P.O.:720; I.V.:110; IV Piggyback:300] Out: -   Intake/Output this shift:     Studies/Results: Nm Hepato W/eject Fract  03/03/2014   CLINICAL DATA:  Nausea and vomiting  EXAM: NUCLEAR MEDICINE HEPATOBILIARY IMAGING WITH GALLBLADDER EF  Views: Anterior, right lateral right upper  Radionuclide: Technetium 51m Choletec  Dose:  5.0 mCi  Route of administration: Intravenous  COMPARISON:  None.  FINDINGS: Liver uptake of radiotracer is normal. There is prompt visualization of gallbladder and small bowel, indicating patency of the cystic and common bile ducts. At weight based dose, 1.63 mcg, of CCK was administered intravenously with calculation of the computer generated ejection fraction of radiotracer from the gallbladder. The patient did not experience clinical symptoms with CCK administration. The computer generated ejection fraction of radiotracer from the gallbladder is normal at 50.8%, normal greater than 38%.  IMPRESSION: Study within normal limits.   Electronically Signed   By: Lowella Grip M.D.   On: 03/03/2014 08:13    ASSESMENT:   *   Nausea.  Vomiting resolved  but epigastric discomfort persists. CT scan 4/24 days negative, abdominal films 4/29 negative. [place] 01/2014 was normal.  LFTs normal.   ? PUD. ? Post infectious gastroparesis an/o post infectious IBS EGD 5/10: Schattzki ring (not dilated). Bulbar duodenitis.  HIDA scan 5/13: normal.  sxs better but persist despite qid Reglan, BID Protonix, QID Zofran.    PLAN   *  Switch to po Reglan, continue PPI once daily, Miralax prn. Gina Wise  03/03/2014, 9:07 AM Pager: [phone]   ________________________________________________________________________  Velora Heckler GI MD note:  I personally examined the patient, reviewed the data and agree with the assessment and plan described above.  No clear etiology of her symptoms after extensive GI testing.  Perhaps post-viral gastroparesis (illness started as an acute diarrheal illness).  My office will contact her about follow up with Dr. Hilarie Fredrickson in 5-6 weeks, she should stay on tid to qid reglan for now, continue once daily PPI and miralax for constipation.  Ok to d/c from GI perspective.   Owens Loffler, MD [facility]astroenter[facility] Pager [phone]

## 2014-03-03 NOTE — Progress Notes (Signed)
Family Medicine Teaching Service Daily Progress Note Intern Pager: [phone]  Patient name: Gina Wise Medical record number: [id number] Date of birth: May 18, 1961 Age: 53 y.o. Gender: female  Primary Care Provider: Liam Graham, MD Consultants: GI Code Status: full  Pt Overview and Major Events to Date:  5/9: admitted, on IVF and schedule antiemetics; morphine for headache, GI consult requested 5/10: overall improving but with small emesis overnight; TSH mildly elevated 5/11: EGD yesterday with schatzki's ring; biopsies sent; trial of bland diet  5/12: awaiting results of biospies, increased Bowel regimen 5/13: HIDA scan  Assessment and Plan: Gina Wise is a 53 y.o. female presenting with return of nausea and vomiting in the absence of diarrhea. PMH is significant for hyperthyroidism, HTN, Migraines, Depression & multiple abdominal surgeries (appendectomy, tubal ligation, hysterectomy, inguinal hernia repair)   #FEN/GI: N/V of unknown etiology; DDx unclear, possibly related to EGD findings of Schatzki's ring and sml hiatal hernia vs delayed emptying. Biopsies pending. Still attesting to bloating and postprandial fullness  - miralax, colace prn for constipation - Transition to PO antiemetics/GI meds: Zofran 8 mg ODT q8hrs PRN and Protonix 40 mg PO BID, Reglan 10mg  qid PO - pain control primarily with tylenol, tramadol for break through (would avoid narcotics if at all possible given slowed gastric motility)  - HIDA scan this morning appears normal - Gi onboard, apprec recs  # Endo: Hypothyroidism: TSH nml during last admission, now slightly elevated; likely emesis and not keeping down medication vs acute illness causing thyroid abnormality - transition back to home dose 93mcg - repeat TSH as an outpt  # CV: HTN/tahcycardia: Currently well controlled, normotensive in the ED with some tachycardia. May be related to dehydration vs anxiety vs strain from emesis; orthostatic vitals  negative on admission - Continue HCTZ  - continue to monitor vs per floor protocol   # Psych: Depression: certainly could be contributing to above  - Continue Zoloft, would consider increase as outpt - continue to reassess social situation; reportedly living with roommate   #Neuro: hx of migraine headaches, resolved  -would hold on CT head for eval of increased ICP -Tylenol PRN, tramadol for severe uncontrolled pain  Prophylaxis: Heparin Subq   Disposition: management as above; discharge planning pending improvement and GI testing / recommendations; likely outpt GI follow up  Subjective:  Still with stomach pain this morning, states she is hungry.   Objective: Temp:  [97.8 F (36.6 C)-98.1 F (36.7 C)] 97.8 F (36.6 C) (05/12 2143) Pulse Rate:  [67-72] 67 (05/12 2143) Resp:  [16-18] 18 (05/12 2143) BP: (119-125)/(60-72) 125/72 mmHg (05/12 2143) SpO2:  [99 %-100 %] 99 % (05/12 2143) Physical Exam: General: adult female lying in bed, in NAD Cardiovascular: RRR, no murmurs, rubs or gallops  Respiratory: CTAB, no wheezes, normal WOB  Abdomen: soft, mildly tender mostly  in epigastric region and RUQ; non distended, normoactive BS. Extremities: no edema/cyanosis. WWP  Skin: no rashes or lesions  Neuro: A&OX3; no gross focal deficits  Laboratory:  Recent Labs Lab 02/27/14 1210  WBC 7.1  HGB 14.5  HCT 44.1  PLT 251    Recent Labs Lab 02/27/14 1210 02/28/14 0435  NA 141 145  K 4.5 3.8  CL 103 109  CO2 23 21  BUN 10 7  CREATININE 0.70 0.66  CALCIUM 9.6 8.7  PROT 7.9  --   BILITOT 0.7  --   ALKPHOS 82  --   ALT 19  --   AST 18  --  GLUCOSE 100* 88   UA 5/9 in ED: WNL except for trace Hb  TSH 5/10: 5.140  Imaging/Diagnostic Tests: EGD 02/28/14 RECOMMENDATIONS:  1. Await biopsy results  2. Follow-up of helicobacter pylori status, treat if indicated  3. Trial of metoclopramide before meals and at bedtime vs. gastric  emptying study 1st  4. Repeat EGD can  be performed as an outpatient for dilation of  Schatzki's ring, if needed for esophageal dysphagia. Any future GI  procedures will need to be performed with monitored anesthesia care  HIDA scan 03/03/2014 FINDINGS:  Liver uptake of radiotracer is normal. There is prompt visualization  of gallbladder and small bowel, indicating patency of the cystic and  common bile ducts. At weight based dose, 1.63 mcg, of CCK was  administered intravenously with calculation of the computer  generated ejection fraction of radiotracer from the gallbladder. The  patient did not experience clinical symptoms with CCK  administration. The computer generated ejection fraction of  radiotracer from the gallbladder is normal at 50.8%, normal greater  than 38%.  IMPRESSION:  Study within normal limits.   Gina Sat, MD 03/03/2014, 7:28 AM PGY-1, [place] Intern pager: [phone], text pages welcome

## 2014-03-03 NOTE — Telephone Encounter (Signed)
Pt notified via my chart as well as mail regarding f/u appt

## 2014-03-03 NOTE — Discharge Instructions (Addendum)
Your workup in the hospital was only significant for Schatzki's rings in the esophagus on endoscopy. The HIDA scan to evaluate your gallbladder was normal. The GI doctors would like you to follow up as an outpatient. If they do not call you in 1-2 days for an appointment please call their office. Also call the [facility] for an appt with Dr. Otis Dials for follow up.  Prescriptions for your Nausea/Vomiting: Protonix 40mg  once a day (this is a similar medication to prilosec/omeprazole) Reglan 10mg  3-4 times a day (this is to help your stomach to empty) Zofran 8mg  ODT every 8 hours as needed for nausea Miralax (over the counter): take 1 packet a day for constipation

## 2014-03-03 NOTE — Telephone Encounter (Signed)
Message copied by Barron Alvine on Wed Mar 03, 2014 11:16 AM ------      Message from: Owens Loffler P      Created: Wed Mar 03, 2014 10:59 AM       She needs rov with Pyrtle in 5-6 weeks, hospital followup. Likely home today or tomorrow. ------

## 2014-03-05 NOTE — Discharge Summary (Signed)
Family Medicine Teaching Service  Discharge Note : Attending Jeff Christipher Rieger MD Pager [phone] Inpatient Team Pager:  [phone]  I have reviewed this patient and the patient's chart and have discussed discharge planning with the resident at the time of discharge. I agree with the discharge plan as above.    

## 2014-03-09 ENCOUNTER — Encounter: Payer: Self-pay | Admitting: Family Medicine

## 2014-03-09 ENCOUNTER — Telehealth: Payer: Self-pay | Admitting: Internal Medicine

## 2014-03-09 ENCOUNTER — Ambulatory Visit (INDEPENDENT_AMBULATORY_CARE_PROVIDER_SITE_OTHER): Payer: 59 | Admitting: Family Medicine

## 2014-03-09 VITALS — BP 140/89 | HR 83 | Temp 98.0°F | Ht 66.0 in | Wt 179.0 lb

## 2014-03-09 DIAGNOSIS — R112 Nausea with vomiting, unspecified: Secondary | ICD-10-CM

## 2014-03-09 DIAGNOSIS — Q391 Atresia of esophagus with tracheo-esophageal fistula: Secondary | ICD-10-CM

## 2014-03-09 DIAGNOSIS — Q393 Congenital stenosis and stricture of esophagus: Secondary | ICD-10-CM

## 2014-03-09 DIAGNOSIS — R1115 Cyclical vomiting syndrome unrelated to migraine: Secondary | ICD-10-CM

## 2014-03-09 DIAGNOSIS — K222 Esophageal obstruction: Secondary | ICD-10-CM

## 2014-03-09 NOTE — Progress Notes (Signed)
   Subjective:    Patient ID: Gina Wise, female    DOB: 1960-12-16, 53 y.o.   MRN: [id number]  HPI 53 year old Caucasian female presents for hospital followup. Patient was admitted from 02/27/2014 through 03/03/2014 with nausea and vomiting. Patient underwent EGD which identified Schatzki ring. She also underwent HIDA scan which was within normal limits. 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 Patient was empirically started on Reglan 10 mg 3 times a day. Patient states that she has had improvement of her symptoms. She is approximately 75% improved. She still able to tolerate solid foods however is tolerating liquids and soft diet. She does continue to have intermittent nausea despite use of Zofran. Her abdominal pain is improved. She denies constipation or diarrhea at this time. She is scheduled followup with her GI physician on 04/27/2014 and is wondering if she can have a appointment sooner. She wishes to discuss dilation of the Schatzki ring.   Review of Systems  Constitutional: Negative for fever, chills and fatigue.  Respiratory: Negative for cough, choking and shortness of breath.   Cardiovascular: Negative for chest pain.  Gastrointestinal: Positive for nausea and vomiting. Negative for diarrhea.       Objective:   Physical Exam Vitals: Reviewed exam general: Pleasant Caucasian female, no acute distress HEENT: Normocephalic, pupils are equal round and reactive to light, extraocular movements are intact, moist mucous members, neck was supple, no anterior posterior cervical lymphadenopathy Cardiac: Regular in rhythm, S1 and S2 present, no murmurs, no heaves or thrills Respiratory: Clear to patient bilaterally, normal effort Abdomen: Soft, mild epigastric tenderness, no rebound, no guarding, normal bowel sounds     Assessment & Plan:  Please see problem specific assessment and plan.

## 2014-03-09 NOTE — Assessment & Plan Note (Signed)
Schatzki's ring identified during recent hospitalization on EGD. Thought to maybe be contributing to her intractable nausea and vomiting. -Patient has scheduled followup with her GI physician

## 2014-03-09 NOTE — Telephone Encounter (Signed)
Patient recently discharged from hospital.  She had EGD that showed a Schatzki's ring.  She is c/o "burping and my food coming up".  She is set up for follow up on 04/27/14.  She is asking what can be done until follow up.  She is taking reglan with no relief in symptoms.  Please advise

## 2014-03-09 NOTE — Patient Instructions (Signed)
Please call Dr. Vena Rua office to schedule and appointment sooner to discuss your esophogeal ring.

## 2014-03-09 NOTE — Assessment & Plan Note (Signed)
Patient presents for hospital followup for intractable nausea and vomiting. Patient identified a Schatzki ring which may have been contributing to her symptoms. Patient symptoms are improved on Reglan. -Continue Reglan daily and Zofran as needed for nausea -Patient counseled to move her appointment with GI up to the next couple of weeks to discuss dilation of the Schatzki ring

## 2014-03-09 NOTE — Telephone Encounter (Signed)
Is she having solid food dysphagia or trouble swallowing? Can continue reglan 10 mg TID-AC and HS until followup with daily PPI Could also be worked in with APP sooner if needed

## 2014-03-10 NOTE — Telephone Encounter (Signed)
She reports that she feels like the food gets stuck on the way down, then she gets indigestion and "everything comes back up".  She is offered an appt with APP she would like to see Dr. Hilarie Fredrickson and wants to keep the appt as scheduled.  She ws taking reglan only TID, she will add at HS.

## 2014-03-10 NOTE — Telephone Encounter (Signed)
Left message for patient to call back  

## 2014-03-11 ENCOUNTER — Telehealth: Payer: Self-pay | Admitting: *Deleted

## 2014-03-11 ENCOUNTER — Encounter: Payer: Self-pay | Admitting: Family Medicine

## 2014-03-11 DIAGNOSIS — K3184 Gastroparesis: Secondary | ICD-10-CM | POA: Insufficient documentation

## 2014-03-11 NOTE — Progress Notes (Signed)
Patient ID: Gina Wise, female   DOB: 07/14/1961, 53 y.o.   MRN: [id number] FMLA paperwork completed. Printed office notes/imaging/hospital information. Returned to [employer].

## 2014-03-11 NOTE — Telephone Encounter (Signed)
Left message for patient to call back  

## 2014-03-11 NOTE — Progress Notes (Signed)
Paperwork given to Dr Ree Kida to fill out.Gina Wise Gina Wise

## 2014-03-11 NOTE — Telephone Encounter (Signed)
She does have esophageal ring and repeat EGD for dilation can be scheduled

## 2014-03-11 NOTE — Telephone Encounter (Signed)
Called and informed patient that her FMLA papers are ready, she says she will pick them up today. Left teh paperwork at front desk.Jaymes Graff Ethelreda Sukhu

## 2014-03-11 NOTE — Progress Notes (Signed)
Patient had FMLA papers emailed to me to print out.  She would like these to be faxed once completed by Dr. Ree Kida.

## 2014-03-16 NOTE — Telephone Encounter (Signed)
Left message for patient to call back  

## 2014-03-17 ENCOUNTER — Ambulatory Visit (INDEPENDENT_AMBULATORY_CARE_PROVIDER_SITE_OTHER): Payer: 59 | Admitting: Family Medicine

## 2014-03-17 ENCOUNTER — Encounter: Payer: Self-pay | Admitting: Family Medicine

## 2014-03-17 VITALS — BP 133/84 | HR 92 | Temp 98.0°F | Wt 180.0 lb

## 2014-03-17 DIAGNOSIS — R450 Nervousness: Secondary | ICD-10-CM

## 2014-03-17 MED ORDER — METOCLOPRAMIDE HCL 10 MG PO TABS
5.0000 mg | ORAL_TABLET | Freq: Four times a day (QID) | ORAL | Status: DC
Start: 2014-03-17 — End: 2014-04-27

## 2014-03-17 NOTE — Telephone Encounter (Signed)
FMLA paperwork was incorrect- It was suppose to be short term disability Please call pt for what needs to be corrected

## 2014-03-17 NOTE — Progress Notes (Signed)
Subjective:     Patient ID: Gina Wise, female   DOB: Apr 05, 1961, 53 y.o.   MRN: [id number]  HPI 53 y.o. F here with complaints that her entire body feels shakey and that her body is going to "explode" from inside out. Pt reports having tremors, Pt is having painful cramps and Literally more than one bowel movement an hour. Not diarrhea but soft stool. Worse cramping with food,   Recently discharged from hospital - admitted for stomach pain, vomiting, diarhea and dehydration. Pt was found to have a schotskys ring.   Recently started on reglan and protonix  Meds HCTZ Synthroid Zoloft Zyrtec Flonase  Protonix Reglan zofran PRN imitrex miralax  Review of Systems Reports headache, no vision changes, feels depth perception is off, no chest pain, no SOB, +Nausea, no emesis, Reports cramping, frequent BMs, no urinary symptoms. No joint pain.    Objective:   Physical Exam Filed Vitals:   03/17/14 1345  BP: 133/84  Pulse: 92  Temp: 98 F (36.7 C)    VSS NAD RRR no mgt CTAB no wrc Minimal abdominal tenderness in lower quadrants. No epigastric tenderness. No c/c/e    Assessment:     53 y.o. F with abdominal cramping and frequent BMs. Suspicious this is a side effect of recent change in reglan 10mg  QID. Pt has recently been started on reglan and symptoms may be related to increased doses of reglan. Will decrease to 5mg  QID. Pt may take additional half tab if symptoms worsen. Pt is scheduled to see GI on 7Jul2015. Return in 2 weeks for reevaluation. If not improved, will reevaluate as needed.  Fredrik Rigger, MD OB Fellow

## 2014-03-17 NOTE — Telephone Encounter (Signed)
Please call pt at [phone] Deadline is May 29

## 2014-03-17 NOTE — Telephone Encounter (Signed)
Patient was seen in the office today and dropped off her FMLA papers with the corrections she says needs to be made. I have the paperwork and will forward this message to Dr Ree Kida, please see me and I will explain the changes she needs corrected.Gina Wise

## 2014-03-17 NOTE — Patient Instructions (Signed)
Decrease Reglan to 5mg  4x daily. May take additional half tab as needed 4x daily.

## 2014-03-17 NOTE — Telephone Encounter (Signed)
Left message for patient to call back  

## 2014-03-18 ENCOUNTER — Telehealth: Payer: Self-pay | Admitting: *Deleted

## 2014-03-18 ENCOUNTER — Encounter: Payer: Self-pay | Admitting: Family Medicine

## 2014-03-18 NOTE — Telephone Encounter (Signed)
Faxed corrected FMLA paperwork and letter stating that patient was transported to the ED from her job on 02-10-14 to [phone] per her request.Denetta Fei L Deziree Mokry

## 2014-03-18 NOTE — Telephone Encounter (Signed)
No return call from the patient.  Will await a return call or she can discuss at upcoming office visit

## 2014-03-18 NOTE — Telephone Encounter (Signed)
Corrections made to [facility] paperwork. Returned paperwork to [employer].

## 2014-03-19 ENCOUNTER — Encounter: Payer: Self-pay | Admitting: Family Medicine

## 2014-03-19 ENCOUNTER — Telehealth: Payer: Self-pay | Admitting: Family Medicine

## 2014-03-19 NOTE — Telephone Encounter (Signed)
Pt called and needs Gina Wise to call her ASAP. The information that was submitted for FMLA  needs to be adjusted. They are requesting the entire list of symptoms and why she needed to be transported. Please call her because this has to be re-submitted by 5 pm today. jw

## 2014-03-19 NOTE — Telephone Encounter (Signed)
Left message at patient's home and work number to return call.Gina Wise

## 2014-03-19 NOTE — Telephone Encounter (Signed)
Another letter was faxed today per patient's request with details about the specific symptoms that was casing her to be taken out of work.Gina Wise Gina Wise

## 2014-03-30 ENCOUNTER — Ambulatory Visit (INDEPENDENT_AMBULATORY_CARE_PROVIDER_SITE_OTHER): Payer: 59 | Admitting: Family Medicine

## 2014-03-30 ENCOUNTER — Encounter: Payer: Self-pay | Admitting: Family Medicine

## 2014-03-30 VITALS — BP 133/89 | HR 109 | Temp 98.1°F | Wt 179.0 lb

## 2014-03-30 DIAGNOSIS — T887XXA Unspecified adverse effect of drug or medicament, initial encounter: Secondary | ICD-10-CM | POA: Insufficient documentation

## 2014-03-30 NOTE — Assessment & Plan Note (Signed)
Patient had jitters/HA/palpitations related to Reglan. Symptoms improved with decreased dose of Reglan to 5 mg BID. Continue current regimen.

## 2014-03-30 NOTE — Patient Instructions (Signed)
Please continue taking Reglan 5 mg twice daily as tolerated. Please keep your appointment with Dr. Hilarie Fredrickson.

## 2014-03-30 NOTE — Progress Notes (Signed)
   Subjective:    Patient ID: Gina Wise, female    DOB: 04-Jul-1961, 53 y.o.   MRN: [id number]  HPI 53 y/o female presents for follow up of medication side effect, she had jitters/HA/Palpitations, seen and evaluated on 03/17/14 and told to decrease Reglan to 5 mg TID, she has been taking BID, she can not tolerate around meal time due to stomach cramping, nausea at baseline, still having feeling of dysphagia, no constipation, no diarrhea, no abdominal pain. The jitters/HA/Palpitations have resolved with the decreased dose of Reglan.    Review of Systems  Constitutional: Negative for fever, chills and fatigue.  Respiratory: Negative for shortness of breath.   Cardiovascular: Negative for chest pain.  Gastrointestinal: Negative for nausea, vomiting and diarrhea.       Objective:   Physical Exam Vitals: reviewed Gen: pleasant female, NAD Cardiac: RRR, S1 and S2 present, no murmurs Resp: CTAB, normal effort Abd: soft, mild epigastric tenderness, hypoactive bowel sounds      Assessment & Plan:  Please see problem specific assessment and plan.

## 2014-04-19 ENCOUNTER — Telehealth: Payer: Self-pay | Admitting: *Deleted

## 2014-04-19 NOTE — Telephone Encounter (Signed)
Received a call from patient, she says she needs a letter stating the duration of time and treatment needed for her FMLA to be covered. She says Dr Ree Kida will know what she is talking about. She can be reached at [phone] if more information is needed.Busick, [name]

## 2014-04-20 ENCOUNTER — Encounter: Payer: Self-pay | Admitting: Family Medicine

## 2014-04-20 NOTE — Telephone Encounter (Signed)
Letter completed, sent to [employer] to fax.

## 2014-04-20 NOTE — Telephone Encounter (Signed)
Left message to return call. Please tell patient I have faxed the requested letter to the same number as previously.Gina Wise, [name]

## 2014-04-21 ENCOUNTER — Encounter: Payer: Self-pay | Admitting: Internal Medicine

## 2014-04-24 ENCOUNTER — Other Ambulatory Visit: Payer: Self-pay | Admitting: Family Medicine

## 2014-04-27 ENCOUNTER — Ambulatory Visit (INDEPENDENT_AMBULATORY_CARE_PROVIDER_SITE_OTHER): Payer: 59 | Admitting: Internal Medicine

## 2014-04-27 ENCOUNTER — Encounter: Payer: Self-pay | Admitting: Internal Medicine

## 2014-04-27 VITALS — BP 120/86 | HR 80 | Ht 66.0 in | Wt 173.4 lb

## 2014-04-27 DIAGNOSIS — K222 Esophageal obstruction: Secondary | ICD-10-CM

## 2014-04-27 DIAGNOSIS — K5900 Constipation, unspecified: Secondary | ICD-10-CM

## 2014-04-27 DIAGNOSIS — R1310 Dysphagia, unspecified: Secondary | ICD-10-CM

## 2014-04-27 DIAGNOSIS — Z1211 Encounter for screening for malignant neoplasm of colon: Secondary | ICD-10-CM

## 2014-04-27 DIAGNOSIS — Q391 Atresia of esophagus with tracheo-esophageal fistula: Secondary | ICD-10-CM

## 2014-04-27 DIAGNOSIS — Q393 Congenital stenosis and stricture of esophagus: Secondary | ICD-10-CM

## 2014-04-27 MED ORDER — MOVIPREP 100 G PO SOLR: ORAL | Status: DC

## 2014-04-27 NOTE — Progress Notes (Signed)
Subjective:    Patient ID: Gina Wise, female    DOB: 05-09-1961, 53 y.o.   MRN: [id number]  HPI Gina Wise is a 53 yo female with PMH of GERD with Schatzki's ring, hypothyroidism, anxiety and depression he was seen as a hospital consult in May 2015. We were consulted for nausea and vomiting. She was also having epigastric and right upper quadrant abdominal pain. Blood and urine testing were unrevealing. Stool studies were negative. At that time she had lost approximately 10 pounds. She underwent an upper endoscopy on 02/28/2014 which revealed a Schatzki's ring with 3 cm hiatal hernia. Esophageal mucosa was unremarkable as was the gastric mucosa. There was mild bulbar duodenitis. Gastric biopsies were performed which revealed mild chronic inactive gastritis without H. pylori. Also during this hospital stay she had a CT scan, ultrasound, and HIDA scan which were all essentially unrevealing.  Today she returns for followup. She is feeling considerably better. She is having trouble with solid food dysphagia which she reports leads to regurgitation of food but doesn't pass in her stomach. This happens intermittently but several times per week. Occasionally liquids have trouble going down but this is predominantly a solid food symptom. Abdominal pain has completely resolved as has nausea and vomiting. She tends towards constipation but denies hard stool. She is using MiraLax 2-3 times per month. She denies blood in her stool or melena. She reports a previous colonoscopy greater than 10 years ago. Denies family history of IBD, colon polyps or cancer.   Review of Systems As per history of present illness, otherwise negative  Current Medications, Allergies, Past Medical History, Past Surgical History, Family History and Social History were reviewed in [employer] record.     Objective:   Physical Exam BP 120/86  Pulse 80  Ht 5\' 6"  (1.676 m)  Wt 173 lb 6.4 oz (78.654 kg)  BMI  28.00 kg/m2  LMP 04/03/2012 Constitutional: Well-developed and well-nourished. No distress. HEENT: Normocephalic and atraumatic. Oropharynx is clear and moist. No oropharyngeal exudate. Conjunctivae are normal.  No scleral icterus. Neck: Neck supple. Trachea midline. Cardiovascular: Normal rate, regular rhythm and intact distal pulses. No M/R/G Pulmonary/chest: Effort normal and breath sounds normal. No wheezing, rales or rhonchi. Abdominal: Soft, nontender, nondistended. Bowel sounds active throughout. There are no masses palpable. No hepatosplenomegaly. Extremities: no clubbing, cyanosis, or edema Lymphadenopathy: No cervical adenopathy noted. Neurological: Alert and oriented to person place and time. Skin: Skin is warm and dry. No rashes noted. Psychiatric: Normal mood and affect. Behavior is normal.  CBC    Component Value Date/Time   WBC 7.1 02/27/2014 1210   RBC 5.02 02/27/2014 1210   HGB 14.5 02/27/2014 1210   HGB 13.3 03/28/2012 1554   HCT 44.1 02/27/2014 1210   PLT 251 02/27/2014 1210   MCV 87.8 02/27/2014 1210   MCH 28.9 02/27/2014 1210   MCHC 32.9 02/27/2014 1210   RDW 13.7 02/27/2014 1210   LYMPHSABS 1.5 02/27/2014 1210   MONOABS 0.5 02/27/2014 1210   EOSABS 0.2 02/27/2014 1210   BASOSABS 0.0 02/27/2014 1210    CMP     Component Value Date/Time   NA 145 02/28/2014 0435   K 3.8 02/28/2014 0435   CL 109 02/28/2014 0435   CO2 21 02/28/2014 0435   GLUCOSE 88 02/28/2014 0435   BUN 7 02/28/2014 0435   CREATININE 0.66 02/28/2014 0435   CREATININE 0.80 02/15/2014 1140   CALCIUM 8.7 02/28/2014 0435   PROT 7.9 02/27/2014 1210  ALBUMIN 3.9 02/27/2014 1210   AST 18 02/27/2014 1210   ALT 19 02/27/2014 1210   ALKPHOS 82 02/27/2014 1210   BILITOT 0.7 02/27/2014 1210   GFRNONAA >90 02/28/2014 0435   GFRAA >90 02/28/2014 0435   CT ABDOMEN AND PELVIS WITH CONTRAST   TECHNIQUE: Multidetector CT imaging of the abdomen and pelvis was performed using the standard protocol following bolus administration of intravenous  contrast.   CONTRAST:  100 mL OMNIPAQUE IOHEXOL 300 MG/ML  SOLN   COMPARISON:  CT abdomen and pelvis 10/17/2009.   FINDINGS: Dependent atelectasis is seen in the lung bases. No pleural or pericardial effusion.   There is minimal intrahepatic biliary ductal prominence. The common bile duct, liver, spleen, adrenal glands and pancreas are unremarkable. Tiny low attenuating lesions in the left kidneys cannot be definitively characterized but likely represent cysts. The stomach and small and large bowel appear normal. The appendix is not visualized and may have been removed. No evidence of inflammatory process is seen. There is no lymphadenopathy or fluid. No focal bony abnormality is seen.   IMPRESSION: No acute finding abdomen or pelvis. No finding to explain the patient's symptoms.     Electronically Signed   By: Inge Rise M.D.   On: 02/12/2014 20:05 _____________________________________________________________________________________ US ABDOMEN LIMITED - RIGHT UPPER QUADRANT   COMPARISON:  12/24/2008; correlation CT abdomen and pelvis 02/12/2014   FINDINGS: Gallbladder:   Normally distended without stones or wall thickening.   No pericholecystic fluid or sonographic Murphy sign.   Common bile duct:   Diameter: Normal caliber 4 mm diameter   Liver:   Normal appearance   No RIGHT upper quadrant ascites   IMPRESSION: Negative RIGHT upper quadrant ultrasound. ________________________________________________________________________________  NUCLEAR MEDICINE HEPATOBILIARY IMAGING WITH GALLBLADDER EF   Views: Anterior, right lateral right upper   Radionuclide: Technetium 61m Choletec   Dose:  5.0 mCi   Route of administration: Intravenous   COMPARISON:  None.   FINDINGS: Liver uptake of radiotracer is normal. There is prompt visualization of gallbladder and small bowel, indicating patency of the cystic and common bile ducts. At weight based dose,  1.63 mcg, of CCK was administered intravenously with calculation of the computer generated ejection fraction of radiotracer from the gallbladder. The patient did not experience clinical symptoms with CCK administration. The computer generated ejection fraction of radiotracer from the gallbladder is normal at 50.8%, normal greater than 38%.   IMPRESSION: Study within normal limits.     Electronically Signed   By: Lowella Grip M.D.   On: 03/03/2014 08:13      Assessment & Plan:   53 yo female with PMH of GERD with Schatzki's ring, hypothyroidism, anxiety and depression who is seen in the hospital in May 2015 to evaluate upper abdominal pain with nausea and vomiting, all of which has resolved but now with solid food dysphagia.  1.  Esophageal dysphagia with history of hiatal hernia with Schatzki's ring -- her esophageal dysphagia likely relates to her known hiatal hernia and Schatzki's range. I recommended upper endoscopy for dilation. We discussed this test including risks and benefits and she is agreeable to proceed  2.  Abdominal pain with nausea and vomiting -- symptoms have resolved. She reports improvement in her appetite. We'll again examined duodenum at the time of EGD and consider biopsies to exclude celiac disease  3.  CRC screening -- prior colonoscopy, greater than 10 years ago. Average risk from a CRC perspective. I recommended screening colonoscopy, and after discussion  of the risks and benefits she is agreeable to proceed. This will be performed on the same day as her upper endoscopy.  4. Mild constipation -- I recommended schedule MiraLax every other day to help prevent constipation. When she was taking it daily her stools were too loose, but I have recommended a scheduled dosing interval for better efficacy.

## 2014-04-27 NOTE — Patient Instructions (Signed)
You have been scheduled for a colonoscopy/endoscopy Please follow written instructions given to you at your visit today.  Please pick up your prep kit at the pharmacy within the next 1-3 days. If you use inhalers (even only as needed), please bring them with you on the day of your procedure. Your physician has requested that you go to www.startemmi.com and enter the access code given to you at your visit today. This web site gives a general overview about your procedure. However, you should still follow specific instructions given to you by our office regarding your preparation for the procedure.  Take miralax every other day

## 2014-04-30 ENCOUNTER — Encounter: Payer: Self-pay | Admitting: Internal Medicine

## 2014-04-30 ENCOUNTER — Telehealth: Payer: Self-pay | Admitting: Internal Medicine

## 2014-04-30 NOTE — Telephone Encounter (Signed)
movi voucher left at the front desk for pt to pick up.  Pt is aware

## 2014-05-06 ENCOUNTER — Ambulatory Visit (AMBULATORY_SURGERY_CENTER): Payer: 59 | Admitting: Internal Medicine

## 2014-05-06 ENCOUNTER — Encounter: Payer: Self-pay | Admitting: Internal Medicine

## 2014-05-06 VITALS — BP 127/71 | HR 72 | Temp 97.4°F | Resp 15 | Ht 66.0 in | Wt 173.0 lb

## 2014-05-06 DIAGNOSIS — K219 Gastro-esophageal reflux disease without esophagitis: Secondary | ICD-10-CM

## 2014-05-06 DIAGNOSIS — Q391 Atresia of esophagus with tracheo-esophageal fistula: Secondary | ICD-10-CM

## 2014-05-06 DIAGNOSIS — K222 Esophageal obstruction: Secondary | ICD-10-CM

## 2014-05-06 DIAGNOSIS — R1310 Dysphagia, unspecified: Secondary | ICD-10-CM

## 2014-05-06 DIAGNOSIS — Z1211 Encounter for screening for malignant neoplasm of colon: Secondary | ICD-10-CM

## 2014-05-06 DIAGNOSIS — K2270 Barrett's esophagus without dysplasia: Secondary | ICD-10-CM

## 2014-05-06 DIAGNOSIS — Q393 Congenital stenosis and stricture of esophagus: Secondary | ICD-10-CM

## 2014-05-06 MED ORDER — SODIUM CHLORIDE 0.9 % IV SOLN: 500.0000 mL | INTRAVENOUS | Status: DC

## 2014-05-06 NOTE — Progress Notes (Signed)
Report to PACU, RN, vss, BBS= Clear.  

## 2014-05-06 NOTE — Progress Notes (Signed)
Called to room to assist during endoscopic procedure.  Patient ID and intended procedure confirmed with present staff. Received instructions for my participation in the procedure from the performing physician.  

## 2014-05-06 NOTE — Op Note (Signed)
[place]  [address]. [place], [address]   ENDOSCOPY PROCEDURE REPORT  PATIENT: Gina Wise, Gina Wise  MR#: [id number] BIRTHDATE: 1961-04-20 , 53  yrs. old GENDER: Female ENDOSCOPIST: Jerene Bears, MD PROCEDURE DATE:  05/06/2014 PROCEDURE:  EGD w/ biopsy; EGD with balloon dilation ASA CLASS:     Class II INDICATIONS:  Dysphagia. MEDICATIONS: MAC sedation, administered by CRNA and propofol (Diprivan) 340mg  IV TOPICAL ANESTHETIC: none  DESCRIPTION OF PROCEDURE: After the risks benefits and alternatives of the procedure were thoroughly explained, informed consent was obtained.  The LB [id number] endoscope was introduced through the mouth and advanced to the second portion of the duodenum. Without limitations.  The instrument was slowly withdrawn as the mucosa was fully examined.     ESOPHAGUS: A Schatzki ring was found 38 cm from the incisors, with very small hiatal hernia. Balloon dilation was performed using TTS balloon to 15 mm across the GE junction. Multiple biopsies were performed at the Schatzki's ring to disrupt the ring, using cold forceps.   The esophagus was otherwise normal.  STOMACH: There was mild antral gastropathy noted, recently biopsied. Otherwise normal stomach  DUODENUM: The duodenal mucosa showed no abnormalities in the bulb and second portion of the duodenum.  Retroflexed views revealed no abnormalities.     The scope was then withdrawn from the patient and the procedure completed.  COMPLICATIONS: There were no complications.  ENDOSCOPIC IMPRESSION: 1.   Schatzki ring was found 38 cm from the incisors; balloon dilation to 15 mm,  multiple biopsies 2.   The esophagus was otherwise normal. 3.   There was mild antral gastropathy noted 4.   The duodenal mucosa showed no abnormalities in the bulb and second portion of the duodenum  RECOMMENDATIONS: 1.  Continue taking your PPI (antiacid medicine) once daily.  It is best to be  taken 20-30 minutes prior to breakfast meal. 2.  Repeat dilation as needed  eSigned:  Jerene Bears, MD 05/06/2014 11:47 AM      CC:The Patient

## 2014-05-06 NOTE — Patient Instructions (Signed)
YOU HAD AN ENDOSCOPIC PROCEDURE TODAY AT [facility]: Refer to the procedure report that was given to you for any specific questions about what was found during the examination.  If the procedure report does not answer your questions, please call your gastroenterologist to clarify.  If you requested that your care partner not be given the details of your procedure findings, then the procedure report has been included in a sealed envelope for you to review at your convenience later.  YOU SHOULD EXPECT: Some feelings of bloating in the abdomen. Passage of more gas than usual.  Walking can help get rid of the air that was put into your GI tract during the procedure and reduce the bloating. If you had a lower endoscopy (such as a colonoscopy or flexible sigmoidoscopy) you may notice spotting of blood in your stool or on the toilet paper. If you underwent a bowel prep for your procedure, then you may not have a normal bowel movement for a few days.  DIET: FOLLOW DILATION HANDOUT.    ACTIVITY: Your care partner should take you home directly after the procedure.  You should plan to take it easy, moving slowly for the rest of the day.  You can resume normal activity the day after the procedure however you should NOT DRIVE or use heavy machinery for 24 hours (because of the sedation medicines used during the test).    SYMPTOMS TO REPORT IMMEDIATELY: A gastroenterologist can be reached at any hour.  During normal business hours, 8:30 AM to 5:00 PM Monday through Friday, call [phone].  After hours and on weekends, please call the GI answering service at [phone] who will take a message and have the physician on call contact you.   Following lower endoscopy (colonoscopy or flexible sigmoidoscopy):  Excessive amounts of blood in the stool  Significant tenderness or worsening of abdominal pains  Swelling of the abdomen that is new, acute  Fever of 100F or higher  Following upper  endoscopy (EGD)  Vomiting of blood or coffee ground material  New chest pain or pain under the shoulder blades  Painful or persistently difficult swallowing  New shortness of breath  Fever of 100F or higher  Black, tarry-looking stools  FOLLOW UP: If any biopsies were taken you will be contacted by phone or by letter within the next 1-3 weeks.  Call your gastroenterologist if you have not heard about the biopsies in 3 weeks.  Our staff will call the home number listed on your records the next business day following your procedure to check on you and address any questions or concerns that you may have at that time regarding the information given to you following your procedure. This is a courtesy call and so if there is no answer at the home number and we have not heard from you through the emergency physician on call, we will assume that you have returned to your regular daily activities without incident.  SIGNATURES/CONFIDENTIALITY: You and/or your care partner have signed paperwork which will be entered into your electronic medical record.  These signatures attest to the fact that that the information above on your After Visit Summary has been reviewed and is understood.  Full responsibility of the confidentiality of this discharge information lies with you and/or your care-partner.   Resume medications. Information given on Dilation Diet, Diverticulosis and high fiber diet with discharge instructions.

## 2014-05-06 NOTE — Progress Notes (Signed)
Time for pt. Discharge arrived.  She stated that she and her care partner don't have a ride.  They have called someone to come and pick them up.  Will keep until ride arrives

## 2014-05-06 NOTE — Op Note (Signed)
[place]  [address]. [place], [address]   COLONOSCOPY PROCEDURE REPORT  PATIENT: Gina Wise, Gina Wise  MR#: [id number] BIRTHDATE: Nov 24, 1960 , 53  yrs. old GENDER: Female ENDOSCOPIST: Jerene Bears, MD PROCEDURE DATE:  05/06/2014 PROCEDURE:   Colonoscopy, screening First Screening Colonoscopy - Avg.  risk and is 50 yrs.  old or older - No.  Prior Negative Screening - Now for repeat screening. N/A  History of Adenoma - Now for follow-up colonoscopy & has been > or = to 3 yrs.  N/A  Polyps Removed Today? No.  Recommend repeat exam, <10 yrs? No. ASA CLASS:   Class II INDICATIONS:average risk screening. MEDICATIONS: MAC sedation, administered by CRNA and propofol (Diprivan) 260mg  IV  DESCRIPTION OF PROCEDURE:   After the risks benefits and alternatives of the procedure were thoroughly explained, informed consent was obtained.  A digital rectal exam revealed no rectal mass.   The LB [id number]  endoscope was introduced through the anus and advanced to the cecum, which was identified by both the appendix and ileocecal valve. No adverse events experienced. The quality of the prep was Moviprep fair  The instrument was then slowly withdrawn as the colon was fully examined.   COLON FINDINGS: Mild diverticulosis was noted in the ascending colon and at the hepatic flexure.   The colon mucosa was otherwise normal.  Retroflexed views revealed no abnormalities. The time to cecum=4 minutes 43 seconds.  Withdrawal time=12 minutes 43 seconds. The scope was withdrawn and the procedure completed.  COMPLICATIONS: There were no complications.  ENDOSCOPIC IMPRESSION: 1.   Mild diverticulosis was noted in the ascending colon and at the hepatic flexure 2.   The colon mucosa was otherwise normal  RECOMMENDATIONS: 1.  High fiber diet 2.  You should continue to follow colorectal cancer screening guidelines for "routine risk" patients with a repeat colonoscopy in 10 years.   There is no need for FOBT (stool) testing for at least 5 years.   eSigned:  Jerene Bears, MD 05/06/2014 11:49 AM   cc: The Patient; Dossie Arbour, MD

## 2014-05-06 NOTE — Progress Notes (Signed)
1420 pt. Ride arrived. Discharged to homje.

## 2014-05-07 ENCOUNTER — Telehealth: Payer: Self-pay | Admitting: *Deleted

## 2014-05-07 NOTE — Telephone Encounter (Signed)
  Follow up Call-  Call back number 05/06/2014  Post procedure Call Back phone  # [phone]  Permission to leave phone message Yes     Patient questions:  Left message to call us if necessary.

## 2014-05-13 ENCOUNTER — Encounter: Payer: Self-pay | Admitting: Internal Medicine

## 2014-06-07 ENCOUNTER — Other Ambulatory Visit: Payer: Self-pay | Admitting: *Deleted

## 2014-06-07 MED ORDER — HYDROCHLOROTHIAZIDE 25 MG PO TABS: 12.5000 mg | ORAL_TABLET | Freq: Every day | ORAL | Status: DC

## 2014-07-03 ENCOUNTER — Other Ambulatory Visit: Payer: Self-pay | Admitting: Family Medicine

## 2014-07-03 ENCOUNTER — Other Ambulatory Visit: Payer: Self-pay | Admitting: Internal Medicine

## 2014-07-03 ENCOUNTER — Telehealth: Payer: Self-pay | Admitting: Internal Medicine

## 2014-07-03 MED ORDER — SERTRALINE HCL 100 MG PO TABS: 50.0000 mg | ORAL_TABLET | Freq: Every day | ORAL | Status: DC

## 2014-07-03 NOTE — Telephone Encounter (Signed)
I received a call on the on call family medicine pager this afternoon from Ms. Gina Wise in regards to her zoloft refill. She reports being out of the medication for past 2 days and apparently gets refills q2 weeks.  She is currently at the pharmacy who needs a refill order prior to filling.  I also then spoke with the pharmacist at her [facility] who says last refill was on 06/20/14 by Dr. Sherril Cong for 6 tablets (no records of phone conversation that I could find on epic but confirmed by pharmacist). The pharmacist reports patient is supposed to be taking 100mg  daily per their records but on epic and prior discharge summaries dose is listed as 50mg  daily of 100mg  tablet. I then confirmed the dose with the patient who reports she has been taking half of her 100mg  tablet daily. Therefore, I will refill zoloft again today 50mg  qd for another 6 tablets with no refills and forward this note to PCP as I am only covering this weekend.   Signed: Jerene Pitch, MD PGY-3, Internal Medicine Resident 07/03/2014,1:40 PM

## 2014-07-05 MED ORDER — SERTRALINE HCL 100 MG PO TABS: 50.0000 mg | ORAL_TABLET | Freq: Every day | ORAL | Status: DC

## 2014-07-05 NOTE — Telephone Encounter (Signed)
Spoke with patient and informed her of message from PCP and to make an appointment before she runs out of medication, she states she really doesn't have the money to come in for an appointment any time soon. Explained to patient that is was important that we follow up on her depression since she is on the medication but that I would forward message to MD.

## 2014-07-05 NOTE — Telephone Encounter (Signed)
Below noted.  

## 2014-07-05 NOTE — Telephone Encounter (Signed)
30 day supply of Zoloft sent to pharmacy.  Note to nursing staff - please schedule patient for follow up appointment for anxiety/depression. Inform her that refill sent to pharmacy

## 2014-07-05 NOTE — Addendum Note (Signed)
Addended by: [name] on: 07/05/2014 01:43 PM   Modules accepted: Orders

## 2014-07-06 ENCOUNTER — Other Ambulatory Visit: Payer: Self-pay | Admitting: *Deleted

## 2014-07-06 MED ORDER — LEVOTHYROXINE SODIUM 88 MCG PO TABS: 88.0000 ug | ORAL_TABLET | Freq: Every day | ORAL | Status: DC

## 2014-07-09 ENCOUNTER — Ambulatory Visit (INDEPENDENT_AMBULATORY_CARE_PROVIDER_SITE_OTHER): Payer: 59 | Admitting: Internal Medicine

## 2014-07-09 ENCOUNTER — Encounter: Payer: Self-pay | Admitting: Internal Medicine

## 2014-07-09 VITALS — BP 116/90 | HR 96 | Ht 65.25 in | Wt 164.4 lb

## 2014-07-09 DIAGNOSIS — K5730 Diverticulosis of large intestine without perforation or abscess without bleeding: Secondary | ICD-10-CM

## 2014-07-09 DIAGNOSIS — Q391 Atresia of esophagus with tracheo-esophageal fistula: Secondary | ICD-10-CM

## 2014-07-09 DIAGNOSIS — K222 Esophageal obstruction: Secondary | ICD-10-CM

## 2014-07-09 DIAGNOSIS — K219 Gastro-esophageal reflux disease without esophagitis: Secondary | ICD-10-CM

## 2014-07-09 DIAGNOSIS — Q393 Congenital stenosis and stricture of esophagus: Secondary | ICD-10-CM

## 2014-07-09 DIAGNOSIS — I10 Essential (primary) hypertension: Secondary | ICD-10-CM

## 2014-07-09 NOTE — Patient Instructions (Signed)
Continue Prilosec over the counter.  You will be due for a recall endoscopy in 04/2017. We will send you a reminder in the mail when it gets closer to that time.  Follow up with Dr. Hilarie Fredrickson as needed.

## 2014-07-09 NOTE — Progress Notes (Signed)
Subjective:    Patient ID: Gina Wise, female    DOB: 1961/10/08, 53 y.o.   MRN: [id number]  HPI Gina Wise is a 53 year old female with a past medical history of GERD with Schatzki's ring, diverticulosis, hypothyroidism, anxiety and depression who is seen in followup. She was last seen in early July 2015 for solid food dysphagia. She returned for upper endoscopy and screening colonoscopy performed on 06/07/2014. Upper endoscopy revealed a Schatzki's ring which was dilated with TTS balloon to 15 mm. Cold forcep biopsies were used to disrupt the ring. 2 cm hiatal hernia. Mild antral gastropathy and normal examined duodenum. Pathology revealed squamocolumnar Junction mucosa with focal goblet cells. There was no evidence of dysplasia or malignancy. Colonoscopy performed revealed mild diverticulosis in the right colon with an otherwise normal examined colon.  Today she returns stating she is feeling great. Her dysphagia has resolved and she is able to keep any solid food she wishes. She is taking omeprazole 20 mg daily which she is buying over-the-counter. No heartburn or odynophagia. She occasionally has trouble swallowing pills if she is sitting down but otherwise no trouble. Denies nausea or vomiting. No abdominal pain. Bowel habits have returned to normal without diarrhea or constipation. She is not needing MiraLax. She is a bit concerned today regarding her diastolic blood pressure and notes it has been elevated recently. Today her diastolic pressure is 90. She has lost about 9 pounds intentionally over the last 2 months and she attributes this to a more healthy diet and been able to eat solids again. She is also working with exercise.   Review of Systems As per history of present illness, otherwise negative  Current Medications, Allergies, Past Medical History, Past Surgical History, Family History and Social History were reviewed in [employer] record.     Objective:   Physical Exam BP 116/90  Pulse 96  Ht 5' 5.25" (1.657 m)  Wt 164 lb 6 oz (74.56 kg)  BMI 27.16 kg/m2  LMP 04/03/2012 Constitutional: Well-developed and well-nourished. No distress. HEENT: Normocephalic and atraumatic. Oropharynx is clear and moist. No oropharyngeal exudate. Conjunctivae are normal.  No scleral icterus. Neck: Neck supple. Trachea midline. Cardiovascular: Normal rate, regular rhythm and intact distal pulses. No M/R/G Pulmonary/chest: Effort normal and breath sounds normal. No wheezing, rales or rhonchi. Abdominal: Soft, mild epigastric tenderness without rebound or guarding, nondistended. Bowel sounds active throughout. There are no masses palpable. No hepatosplenomegaly. Extremities: no clubbing, cyanosis, or edema Lymphadenopathy: No cervical adenopathy noted. Neurological: Alert and oriented to person place and time. Skin: Skin is warm and dry. No rashes noted. Psychiatric: Normal mood and affect. Behavior is normal.  CBC    Component Value Date/Time   WBC 7.1 02/27/2014 1210   RBC 5.02 02/27/2014 1210   HGB 14.5 02/27/2014 1210   HGB 13.3 03/28/2012 1554   HCT 44.1 02/27/2014 1210   PLT 251 02/27/2014 1210   MCV 87.8 02/27/2014 1210   MCH 28.9 02/27/2014 1210   MCHC 32.9 02/27/2014 1210   RDW 13.7 02/27/2014 1210   LYMPHSABS 1.5 02/27/2014 1210   MONOABS 0.5 02/27/2014 1210   EOSABS 0.2 02/27/2014 1210   BASOSABS 0.0 02/27/2014 1210    CMP     Component Value Date/Time   NA 145 02/28/2014 0435   K 3.8 02/28/2014 0435   CL 109 02/28/2014 0435   CO2 21 02/28/2014 0435   GLUCOSE 88 02/28/2014 0435   BUN 7 02/28/2014 0435   CREATININE 0.66 02/28/2014  0435   CREATININE 0.80 02/15/2014 1140   CALCIUM 8.7 02/28/2014 0435   PROT 7.9 02/27/2014 1210   ALBUMIN 3.9 02/27/2014 1210   AST 18 02/27/2014 1210   ALT 19 02/27/2014 1210   ALKPHOS 82 02/27/2014 1210   BILITOT 0.7 02/27/2014 1210   GFRNONAA >90 02/28/2014 0435   GFRAA >90 02/28/2014 0435    EGD/colonoscopy July 2015 reviewed + pathology with  the patient    Assessment & Plan:  53 year old female with a past medical history of GERD with Schatzki's ring, diverticulosis, hypothyroidism, anxiety and depression who is seen in followup.  1. GERD with Schatzki's ring -- dysphagia improved significantly after dilation and biopsy of the ring. There was focal goblet cells, but no endoscopic evidence of Barrett's esophagus. This is likely related to biopsies of the gastric cardia. To be sure, we discussed this, and will repeat endoscopy in 3 years for surveillance to ensure no Barrett's change. She is comfortable with this plan. She will continue omeprazole 20 mg daily. She is asked to notify me should dysphagia return, and we would likely consider repeat dilation.  2. Diverticulosis -- minimal. She eats a high-fiber diet. No history of diverticulitis. Repeat colonoscopy recommended in 10 years for colorectal cancer screening  3. Hypertension -- mild on hydrochlorothiazide 12.5 mg daily. She is concerned about her diastolic pressure and I have asked that she notify her primary care office to discuss further. I have recommended a low-sodium diet as she is already working to lose weight and exercise. I expect she may need her hydrochlorothiazide increased to 25 mg daily, but will defer this to primary care  She will followup as needed

## 2014-07-20 ENCOUNTER — Encounter: Payer: Self-pay | Admitting: Internal Medicine

## 2014-07-22 ENCOUNTER — Telehealth: Payer: Self-pay

## 2014-07-22 NOTE — Telephone Encounter (Signed)
Recall entered  °

## 2014-07-22 NOTE — Telephone Encounter (Signed)
Message copied by Algernon Huxley on Thu Jul 22, 2014  9:43 AM ------      Message from: Jerene Bears      Created: Wed Jul 21, 2014  3:28 PM       Please change recall EGD to July 2016 (rather than July 2018)      Thank you            Ulice Dash ------

## 2014-08-02 ENCOUNTER — Ambulatory Visit: Payer: 59 | Admitting: Family Medicine

## 2014-08-06 ENCOUNTER — Other Ambulatory Visit: Payer: Self-pay

## 2014-08-10 ENCOUNTER — Encounter: Payer: Self-pay | Admitting: Family Medicine

## 2014-08-10 ENCOUNTER — Ambulatory Visit (INDEPENDENT_AMBULATORY_CARE_PROVIDER_SITE_OTHER): Payer: 59 | Admitting: Family Medicine

## 2014-08-10 VITALS — BP 141/86 | HR 93 | Temp 98.1°F | Wt 164.0 lb

## 2014-08-10 DIAGNOSIS — R7301 Impaired fasting glucose: Secondary | ICD-10-CM

## 2014-08-10 DIAGNOSIS — E049 Nontoxic goiter, unspecified: Secondary | ICD-10-CM

## 2014-08-10 DIAGNOSIS — K222 Esophageal obstruction: Secondary | ICD-10-CM

## 2014-08-10 DIAGNOSIS — I10 Essential (primary) hypertension: Secondary | ICD-10-CM

## 2014-08-10 DIAGNOSIS — Q394 Esophageal web: Secondary | ICD-10-CM

## 2014-08-10 DIAGNOSIS — K219 Gastro-esophageal reflux disease without esophagitis: Secondary | ICD-10-CM

## 2014-08-10 DIAGNOSIS — E785 Hyperlipidemia, unspecified: Secondary | ICD-10-CM | POA: Insufficient documentation

## 2014-08-10 DIAGNOSIS — E031 Congenital hypothyroidism without goiter: Secondary | ICD-10-CM

## 2014-08-10 DIAGNOSIS — R0989 Other specified symptoms and signs involving the circulatory and respiratory systems: Secondary | ICD-10-CM | POA: Insufficient documentation

## 2014-08-10 DIAGNOSIS — R198 Other specified symptoms and signs involving the digestive system and abdomen: Secondary | ICD-10-CM

## 2014-08-10 DIAGNOSIS — R6889 Other general symptoms and signs: Secondary | ICD-10-CM | POA: Insufficient documentation

## 2014-08-10 LAB — LIPID PANEL
CHOLESTEROL: 292 mg/dL — AB (ref 0–200)
HDL: 56 mg/dL (ref 35–70)
LDL CALC: 205 mg/dL
Triglycerides: 157 mg/dL (ref 40–160)

## 2014-08-10 LAB — TSH: TSH: 6.21 u[IU]/mL — ABNORMAL HIGH (ref 0.350–4.500)

## 2014-08-10 LAB — POCT GLYCOSYLATED HEMOGLOBIN (HGB A1C): HEMOGLOBIN A1C: 5.4

## 2014-08-10 MED ORDER — HYDROCHLOROTHIAZIDE 25 MG PO TABS: 25.0000 mg | ORAL_TABLET | Freq: Every day | ORAL | Status: DC

## 2014-08-10 MED ORDER — LORATADINE 10 MG PO TABS: 10.0000 mg | ORAL_TABLET | Freq: Every day | ORAL | Status: DC

## 2014-08-10 MED ORDER — OMEPRAZOLE 20 MG PO CPDR: 20.0000 mg | DELAYED_RELEASE_CAPSULE | Freq: Every day | ORAL | Status: DC

## 2014-08-10 MED ORDER — FLUTICASONE PROPIONATE 50 MCG/ACT NA SUSP: 2.0000 | Freq: Every day | NASAL | Status: DC

## 2014-08-10 NOTE — Assessment & Plan Note (Signed)
Patient had elevated TSH in 02/2014 (TSH 5.14) -recheck TSH today and adjust synthroid based on level

## 2014-08-10 NOTE — Assessment & Plan Note (Signed)
Symptoms controlled with omeprazole 20 mg BID

## 2014-08-10 NOTE — Progress Notes (Signed)
Patient ID: Gina Wise, female   DOB: [birth year], 53 y.o.   MRN: [id number] Reviewed labs.

## 2014-08-10 NOTE — Progress Notes (Signed)
   Subjective:    Patient ID: Gina Wise, female    DOB: Dec 19, 1960, 53 y.o.   MRN: [id number]  HPI 52 y/o female presents for routine visit.  HTN - currently on HCTZ 12.5 mg daily, home BP's 140's/80-90's, no chest pain, no vision changes, chronic headaches that are unchanged  HLD - [facility], results are below, patient notes high cholesterol  Schatzki's ring/GERD - symptoms much improved s/p balloon dilation, no evidence of Barretts on biopsy, still has occasional feeling of things stuck in throat/clearing throat, reports worsening over the past few weeks, does have associated rhinorrhea/cough  Hypothyroidism/Goiter - TSH previously elevated, due to recheck of TSH  Social - never a smoker   Review of Systems  Constitutional: Negative for fever, chills and fatigue.  Respiratory: Positive for cough. Negative for shortness of breath.   Cardiovascular: Negative for chest pain.  Gastrointestinal: Negative for nausea, vomiting and diarrhea.       Objective:   Physical Exam Vitals: reviewed Gen: pleasant female, NAD HEENT: normocephalic, PERRL, EOMI, no scleral icterus, MMM, uvula midline, goiter present (enlarged predominantly on the right side) Cardiac: RRR, S1 and S2 present, no murmurs, no heaves/thrills Resp: CTAB, normal effort Ext: no edema  [facility] Chol - 292 LDL - 205 HDL - 56 Trig - 157  Reviewed previous US from 2014 that showed right sided nodule and goiter     Assessment & Plan:  Please see problem specific assessment and plan.

## 2014-08-10 NOTE — Progress Notes (Signed)
Will forward to PCP 

## 2014-08-10 NOTE — Assessment & Plan Note (Signed)
Symptoms of dysphagia improved s/p dilation. -continue PPI

## 2014-08-10 NOTE — Assessment & Plan Note (Signed)
Patient has elevated total cholesterol and LDL however not in the statin benefit group and ASCVD is less than 5%. -will continue to monitor cholesterol, discussed dietary changes

## 2014-08-10 NOTE — Patient Instructions (Signed)
High Blood Pressure - increase HCTZ to 25 mg daily  Hypothyroid - check TSH today  Cholesterol - Dr. Ree Kida will call you to make recommendations on starting a cholesterol medication  Slightly elevated blood sugars - recheck A1C   Cough/clearing throat.   Return in 2 weeks to discuss headaches and mood.

## 2014-08-10 NOTE — Progress Notes (Signed)
05/26/13   A1c  5.4   Choles 292  Trig 157 HDL 56   LDL 205  T Chol  5.5  Prescriptions will need to be 90 day supply and she will let you know which ones need to be written.  She will give you this infor when you call to give her todays lab results.

## 2014-08-10 NOTE — Assessment & Plan Note (Signed)
Blood pressure slightly elevated based on home readings -increase HCTZ to 25 mg daily -check BMP at next visit

## 2014-08-10 NOTE — Assessment & Plan Note (Signed)
Patient reports chronic throat clearing. Suspect due to GERD however also has symptoms of possible allergic rhinitis. -attempt trial of Flonase -pharmacy student taught patient on proper technique

## 2014-08-11 ENCOUNTER — Telehealth: Payer: Self-pay | Admitting: Family Medicine

## 2014-08-11 DIAGNOSIS — E030 Congenital hypothyroidism with diffuse goiter: Secondary | ICD-10-CM

## 2014-08-12 ENCOUNTER — Encounter: Payer: Self-pay | Admitting: Family Medicine

## 2014-08-12 MED ORDER — LORATADINE 10 MG PO TABS: 10.0000 mg | ORAL_TABLET | Freq: Every day | ORAL | Status: DC

## 2014-08-12 MED ORDER — SERTRALINE HCL 100 MG PO TABS: 50.0000 mg | ORAL_TABLET | Freq: Every day | ORAL | Status: DC

## 2014-08-12 MED ORDER — OMEPRAZOLE 20 MG PO CPDR: 20.0000 mg | DELAYED_RELEASE_CAPSULE | Freq: Every day | ORAL | Status: DC

## 2014-08-12 MED ORDER — FLUTICASONE PROPIONATE 50 MCG/ACT NA SUSP: 2.0000 | Freq: Every day | NASAL | Status: DC

## 2014-08-12 MED ORDER — HYDROCHLOROTHIAZIDE 25 MG PO TABS: 25.0000 mg | ORAL_TABLET | Freq: Every day | ORAL | Status: DC

## 2014-08-12 MED ORDER — LEVOTHYROXINE SODIUM 100 MCG PO TABS: 100.0000 ug | ORAL_TABLET | Freq: Every day | ORAL | Status: DC

## 2014-08-12 MED ORDER — SUMATRIPTAN SUCCINATE 100 MG PO TABS: 100.0000 mg | ORAL_TABLET | ORAL | Status: DC | PRN

## 2014-08-12 NOTE — Telephone Encounter (Signed)
Discussed lab results. Increased synthroid to 100 mcg. Refilled medications to mail order.

## 2014-08-12 NOTE — Assessment & Plan Note (Signed)
TSH remains elevated -increase synthroid to 100 mcg daily

## 2014-08-16 ENCOUNTER — Encounter: Payer: Self-pay | Admitting: Family Medicine

## 2014-08-16 ENCOUNTER — Ambulatory Visit (INDEPENDENT_AMBULATORY_CARE_PROVIDER_SITE_OTHER): Payer: 59 | Admitting: Family Medicine

## 2014-08-16 ENCOUNTER — Telehealth: Payer: Self-pay | Admitting: Family Medicine

## 2014-08-16 VITALS — BP 140/87 | HR 87 | Temp 98.0°F | Ht 62.25 in | Wt 160.0 lb

## 2014-08-16 DIAGNOSIS — R519 Headache, unspecified: Secondary | ICD-10-CM

## 2014-08-16 DIAGNOSIS — R197 Diarrhea, unspecified: Secondary | ICD-10-CM

## 2014-08-16 DIAGNOSIS — R51 Headache: Secondary | ICD-10-CM

## 2014-08-16 DIAGNOSIS — G8929 Other chronic pain: Secondary | ICD-10-CM

## 2014-08-16 DIAGNOSIS — G43909 Migraine, unspecified, not intractable, without status migrainosus: Secondary | ICD-10-CM

## 2014-08-16 MED ORDER — PROMETHAZINE HCL 12.5 MG PO TABS: 12.5000 mg | ORAL_TABLET | Freq: Three times a day (TID) | ORAL | Status: DC | PRN

## 2014-08-16 MED ORDER — SUMATRIPTAN SUCCINATE 100 MG PO TABS: 100.0000 mg | ORAL_TABLET | ORAL | Status: DC | PRN

## 2014-08-16 NOTE — Patient Instructions (Addendum)
Nice to meet you. Please take the phenergan as needed for headache and nausea. Please stay well hydrated.  Please return the stool cards.  Some one will contact you to schedule the CT scan later this week.   Migraine Headache A migraine headache is an intense, throbbing pain on one or both sides of your head. A migraine can last for 30 minutes to several hours. CAUSES  The exact cause of a migraine headache is not always known. However, a migraine may be caused when nerves in the brain become irritated and release chemicals that cause inflammation. This causes pain. Certain things may also trigger migraines, such as:  Alcohol.  Smoking.  Stress.  Menstruation.  Aged cheeses.  Foods or drinks that contain nitrates, glutamate, aspartame, or tyramine.  Lack of sleep.  Chocolate.  Caffeine.  Hunger.  Physical exertion.  Fatigue.  Medicines used to treat chest pain (nitroglycerine), birth control pills, estrogen, and some blood pressure medicines. SIGNS AND SYMPTOMS  Pain on one or both sides of your head.  Pulsating or throbbing pain.  Severe pain that prevents daily activities.  Pain that is aggravated by any physical activity.  Nausea, vomiting, or both.  Dizziness.  Pain with exposure to bright lights, loud noises, or activity.  General sensitivity to bright lights, loud noises, or smells. Before you get a migraine, you may get warning signs that a migraine is coming (aura). An aura may include:  Seeing flashing lights.  Seeing bright spots, halos, or zigzag lines.  Having tunnel vision or blurred vision.  Having feelings of numbness or tingling.  Having trouble talking.  Having muscle weakness. DIAGNOSIS  A migraine headache is often diagnosed based on:  Symptoms.  Physical exam.  A CT scan or MRI of your head. These imaging tests cannot diagnose migraines, but they can help rule out other causes of headaches. TREATMENT Medicines may be  given for pain and nausea. Medicines can also be given to help prevent recurrent migraines.  HOME CARE INSTRUCTIONS  Only take over-the-counter or prescription medicines for pain or discomfort as directed by your health care provider. The use of long-term narcotics is not recommended.  Lie down in a dark, quiet room when you have a migraine.  Keep a journal to find out what may trigger your migraine headaches. For example, write down:  What you eat and drink.  How much sleep you get.  Any change to your diet or medicines.  Limit alcohol consumption.  Quit smoking if you smoke.  Get 7-9 hours of sleep, or as recommended by your health care provider.  Limit stress.  Keep lights dim if bright lights bother you and make your migraines worse. SEEK IMMEDIATE MEDICAL CARE IF:   Your migraine becomes severe.  You have a fever.  You have a stiff neck.  You have vision loss.  You have muscular weakness or loss of muscle control.  You start losing your balance or have trouble walking.  You feel faint or pass out.  You have severe symptoms that are different from your first symptoms. MAKE SURE YOU:   Understand these instructions.  Will watch your condition.  Will get help right away if you are not doing well or get worse. Document Released: 10/08/2005 Document Revised: 02/22/2014 Document Reviewed: 06/15/2013 [facility] Patient Information 2015 [place], [place]. This information is not intended to replace advice given to you by your health care provider. Make sure you discuss any questions you have with your health care provider.

## 2014-08-16 NOTE — Telephone Encounter (Signed)
Note to nursing staff - schedule for my next available, thanks

## 2014-08-16 NOTE — Telephone Encounter (Signed)
Pt tried scheduling a f/u appt with Dr. Ree Kida to discuss having an ultrasound on her thyroid & to f/u on her depression, MD is booked until 11/30, pt wants to know what he wants her to do.

## 2014-08-17 DIAGNOSIS — R197 Diarrhea, unspecified: Secondary | ICD-10-CM | POA: Insufficient documentation

## 2014-08-17 NOTE — Assessment & Plan Note (Addendum)
Patient with red flags of change in headache over the age of 68 and headache waking from sleep. Suspect that this is related to her chronic migraines given that she is neurologically intact, though with the above red flags will need to pursue neuroimaging to ensure no space occupying lesions. MRI brain ordered. Imitrex refilled and given Rx for phenergan. Given return precautions. F/u to be determined after MRI results obtained.   Precepted with Dr Gwendlyn Deutscher

## 2014-08-17 NOTE — Telephone Encounter (Signed)
Left message on voicemail for patient to call back and scheduled for 11/30.

## 2014-08-17 NOTE — Progress Notes (Addendum)
Patient ID: Gina Wise, female   DOB: [birth year], 53 y.o.   MRN: [id number]  Tommi Rumps, MD Phone: [phone]  Gina Wise is a 53 y.o. female who presents today for same day appointment.   Headache: patient notes Thursday of last week she had a migraine that woke her from her sleep. She states it was left sided and behind her left eye. Sharp pain. Endorses photophobia and phonophobia. Denies any neurological deficits with this. States this is similar to her previous migraines with the exception of waking her from sleep and that it was more intense. She notes sleep helped and imitrex helped. She notes another migraine of similar nature occuring Sunday night. She additionally notes a dull sinus headache over the past week as well. She notes a family history of breast, lung, colon, and prostate cancer. No headache at this time.   She additionally notes following her migraine on both occassions she had vomiting and diarrhea for 5 hours. She has had nausea over this time period. No appetite. She is drinking fine. No fevers. She notes blood at the very end of her diarrhea in the stool, though this did not discolor the toilet water. She had a colonoscopy in July with diverticula. No history of hemorrhoids. No recent travel. Has not had water from a stream.   Patient is a nonsmoker.   ROS: Per HPI   Physical Exam Filed Vitals:   08/16/14 1213  BP: 140/87  Pulse: 87  Temp: 98 F (36.7 C)    Gen: NAD, appears tired HEENT: PERRL,  MMM Lungs: CTABL Nl WOB Heart: RRR no MRG Abd: soft, mild bilateral LQ tenderness, no guarding or rebound, ND Neuro: CN 2-12 intact, 5/5 strength in bilateral biceps, triceps, grip, quads, hamstrings, plantar and dorsiflexion, sensation to light touch intact in bilateral UE and LE, normal gait, 2+ patellar reflexes Exts: Non edematous BL  LE, warm and well perfused.    Assessment/Plan: Please see individual problem list.  Tommi Rumps, MD [facility] PGY-3

## 2014-08-17 NOTE — Assessment & Plan Note (Signed)
Patient with onset of diarrhea following migraine headache. Potentially could be related to migraine headache or to a completely separate issue such as viral gastro given combination of diarrhea and vomiting. She appears well hydrated on exam. Advised on continued fluid intake. Given stool cards to determine if there is bleeding occurring. If continues would consider stool studies to evaluate for infectious cause. Given return precautions.

## 2014-08-18 ENCOUNTER — Other Ambulatory Visit: Payer: Self-pay | Admitting: *Deleted

## 2014-08-18 ENCOUNTER — Ambulatory Visit
Admission: RE | Admit: 2014-08-18 | Discharge: 2014-08-18 | Disposition: A | Discharge status: Home / Self Care | Payer: 59 | Source: Ambulatory Visit | Attending: Family Medicine | Admitting: Family Medicine

## 2014-08-18 DIAGNOSIS — R519 Headache, unspecified: Secondary | ICD-10-CM

## 2014-08-18 DIAGNOSIS — R51 Headache: Principal | ICD-10-CM

## 2014-08-18 DIAGNOSIS — G8929 Other chronic pain: Secondary | ICD-10-CM

## 2014-08-18 MED ORDER — SERTRALINE HCL 100 MG PO TABS: 50.0000 mg | ORAL_TABLET | Freq: Every day | ORAL | Status: DC

## 2014-08-18 NOTE — Telephone Encounter (Signed)
Pt has still not received her meds from [facility].  She request this to be sent to [facility] in the meantime. Gina Wise, Salome Spotted

## 2014-08-22 ENCOUNTER — Encounter: Payer: Self-pay | Admitting: Family Medicine

## 2014-08-23 ENCOUNTER — Other Ambulatory Visit: Payer: Self-pay | Admitting: Family Medicine

## 2014-08-23 ENCOUNTER — Encounter: Payer: Self-pay | Admitting: Family Medicine

## 2014-08-23 MED ORDER — HYDROCHLOROTHIAZIDE 25 MG PO TABS: 25.0000 mg | ORAL_TABLET | Freq: Every day | ORAL | Status: DC

## 2014-08-23 MED ORDER — LEVOTHYROXINE SODIUM 100 MCG PO TABS: 100.0000 ug | ORAL_TABLET | Freq: Every day | ORAL | Status: DC

## 2014-08-23 MED ORDER — SERTRALINE HCL 100 MG PO TABS: 100.0000 mg | ORAL_TABLET | Freq: Every day | ORAL | Status: DC

## 2014-09-01 ENCOUNTER — Encounter: Payer: Self-pay | Admitting: Family Medicine

## 2014-09-20 ENCOUNTER — Ambulatory Visit: Payer: 59 | Admitting: Family Medicine

## 2014-09-26 ENCOUNTER — Other Ambulatory Visit: Payer: Self-pay | Admitting: Family Medicine

## 2014-09-27 ENCOUNTER — Other Ambulatory Visit: Payer: Self-pay | Admitting: *Deleted

## 2014-10-04 ENCOUNTER — Encounter: Payer: Self-pay | Admitting: Family Medicine

## 2014-10-04 ENCOUNTER — Ambulatory Visit (INDEPENDENT_AMBULATORY_CARE_PROVIDER_SITE_OTHER): Payer: 59 | Admitting: Family Medicine

## 2014-10-04 VITALS — BP 138/89 | HR 92 | Temp 97.8°F | Ht 62.0 in | Wt 163.0 lb

## 2014-10-04 DIAGNOSIS — E039 Hypothyroidism, unspecified: Secondary | ICD-10-CM

## 2014-10-04 DIAGNOSIS — F331 Major depressive disorder, recurrent, moderate: Secondary | ICD-10-CM

## 2014-10-04 DIAGNOSIS — I10 Essential (primary) hypertension: Secondary | ICD-10-CM

## 2014-10-04 DIAGNOSIS — G4700 Insomnia, unspecified: Secondary | ICD-10-CM

## 2014-10-04 LAB — TSH: TSH: 0.238 u[IU]/mL — ABNORMAL LOW (ref 0.350–4.500)

## 2014-10-04 MED ORDER — HYDROCHLOROTHIAZIDE 25 MG PO TABS: 25.0000 mg | ORAL_TABLET | Freq: Every day | ORAL | Status: DC

## 2014-10-04 MED ORDER — SERTRALINE HCL 100 MG PO TABS: 100.0000 mg | ORAL_TABLET | Freq: Every day | ORAL | Status: DC

## 2014-10-04 NOTE — Assessment & Plan Note (Signed)
BP at goal -continue HCTZ 25 mg daily

## 2014-10-04 NOTE — Patient Instructions (Signed)
Insomnia - please increase Zoloft to 100 mg daily  Thyroid - recheck today  Blood Pressure - controlled, paper script provided  Acid Reflux - continue daily Omeprazole   Return in one month to discuss mood/insomnia.

## 2014-10-04 NOTE — Assessment & Plan Note (Signed)
Due for recheck on TSH on increased dose of Synthroid (100 mcg daily) -check TSH

## 2014-10-04 NOTE — Assessment & Plan Note (Signed)
Mood is depressed on Zoloft 50 daily, She also reports insomnia. -increase Zoloft to 100 mg daily -return visit in 4 weeks

## 2014-10-04 NOTE — Assessment & Plan Note (Signed)
Insomnia likely related to depression -increase Zoloft to 100 mg daily -discussed sleep hygiene and importance of regular exercise

## 2014-10-04 NOTE — Progress Notes (Signed)
   Subjective:    Patient ID: Gina Wise, female    DOB: 1961/07/31, 53 y.o.   MRN: [id number]  HPI 53 y/o female presents for routine follow up.  Hypertension - currently on HCTZ 25 mg daily however has not been taking as pharmacy has not had the correct prescription, requests paper script, no chest pain, no headache, no vision changes  Hypothyroid - THS elevated 6 weeks ago, has been taking Synthroid 100 mg daily, reports more energy on this dose  Mood - still depressed however reports improvement of mood, no SI, no HI, reports nightly insomnia, falls asleep well however wakes up most night around 3 AM and can't get back to sleep, denies racing thoughts, on Zoloft 50 mg daily, has been on since 1995-02-04 after the death of her father  Social - has one roommate, roommate has had multiple family members pass away in the past year, 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  Respiratory: Negative for cough and shortness of breath.   Cardiovascular: Negative for chest pain.  Gastrointestinal: Negative for nausea and diarrhea.  Neurological: Positive for headaches.  Psychiatric/Behavioral: Positive for sleep disturbance. Negative for suicidal ideas, self-injury and decreased concentration.       Objective:   Physical Exam Vitals: reviewed  Gen: pleasant Caucasian female, NAD Cardiac: RRR, S1 and S2 present, no murmurs, no heaves/thrills Resp: CTAB, normal effort Psych: appropriately dressed, no tangential thoughts, does not appear to be responding to internal stimuli, affect is much more outgoing compared to previous visit, mood is stated as depressed however improved, no SI, no HI     Assessment & Plan:  Please see problem specific assessment and plan.

## 2014-10-07 ENCOUNTER — Encounter: Payer: Self-pay | Admitting: Internal Medicine

## 2014-10-07 ENCOUNTER — Ambulatory Visit (INDEPENDENT_AMBULATORY_CARE_PROVIDER_SITE_OTHER): Payer: 59 | Admitting: Internal Medicine

## 2014-10-07 ENCOUNTER — Telehealth: Payer: Self-pay | Admitting: Family Medicine

## 2014-10-07 VITALS — BP 124/84 | HR 84 | Ht 65.25 in | Wt 165.0 lb

## 2014-10-07 DIAGNOSIS — J387 Other diseases of larynx: Secondary | ICD-10-CM

## 2014-10-07 DIAGNOSIS — K219 Gastro-esophageal reflux disease without esophagitis: Secondary | ICD-10-CM

## 2014-10-07 DIAGNOSIS — F458 Other somatoform disorders: Secondary | ICD-10-CM

## 2014-10-07 DIAGNOSIS — R0989 Other specified symptoms and signs involving the circulatory and respiratory systems: Secondary | ICD-10-CM

## 2014-10-07 DIAGNOSIS — R198 Other specified symptoms and signs involving the digestive system and abdomen: Secondary | ICD-10-CM

## 2014-10-07 MED ORDER — PANTOPRAZOLE SODIUM 40 MG PO TBEC: 40.0000 mg | DELAYED_RELEASE_TABLET | Freq: Every day | ORAL | Status: DC

## 2014-10-07 MED ORDER — RANITIDINE HCL 150 MG PO TABS: ORAL_TABLET | ORAL | Status: DC

## 2014-10-07 NOTE — Progress Notes (Signed)
   Subjective:    Patient ID: Gina Wise, female    DOB: [birth year], 53 y.o.   MRN: [id number]  HPI Jazaria Jarecki is a 53 yo female with PMH of GERD with Schatzki's ring, diverticulosis, hypothyroidism, anxiety and depression who is seen for follow-up. She was last seen in September 2015 at which point she was doing very well after balloon dilation of a Schatzki's ring in August. Colonoscopy performed around that time was also normal other than mild diverticulosis.  Today she reports over the last several months she's had increase in hoarseness, throat clearing and persistent globus sensation. She is taking omeprazole 20 mg daily. She has also noticed a mild cough. Primary care has placed her on daily Claritin and also Flonase. She denies heartburn or indigestion. Also the esophageal dysphagia she was having before has not returned. She denies abdominal pain. No nausea or vomiting. No change in her bowel habits, rectal bleeding or melena. She is having migraines and using Imitrex. She's also been under stress lately as her roommate is dealing with the loss of his mother and father and also a brother dying of brain cancer. She states she is tired of dealing with her current issues. Denies SI HI   Review of Systems As per history of present illness, otherwise negative  Current Medications, Allergies, Past Medical History, Past Surgical History, Family History and Social History were reviewed in [employer] [facility] record.     Objective:   Physical Exam BP 124/84 mmHg  Pulse 84  Ht 5' 5.25" (1.657 m)  Wt 165 lb (74.844 kg)  BMI 27.26 kg/m2  LMP 04/03/2012 Constitutional: Well-developed and well-nourished. No distress. HEENT: Normocephalic and atraumatic. Oropharynx is clear and moist. No oropharyngeal exudate. Conjunctivae are normal.  No scleral icterus. Neck: Neck supple. Trachea midline. Cardiovascular: Normal rate, regular rhythm and intact distal pulses. No  M/R/G Pulmonary/chest: Effort normal and breath sounds normal. No wheezing, rales or rhonchi. Abdominal: Soft, nontender, nondistended. Bowel sounds active throughout. There are no masses palpable. No hepatosplenomegaly. Extremities: no clubbing, cyanosis, or edema Lymphadenopathy: No cervical adenopathy noted. Neurological: Alert and oriented to person place and time. Skin: Skin is warm and dry. No rashes noted. Psychiatric: Normal mood and affect. Behavior is normal.  EGD July 2015 reviewed    Assessment & Plan:  53 yo female with PMH of GERD with Schatzki's ring, diverticulosis, hypothyroidism, anxiety and depression who is seen for follow-up.   1. GERD with LPR symptoms -- her globus sensation, throat clearing and hoarseness are consistent with LPR. She is not having heartburn as before. Change to more potent PPI with pantoprazole 40 mg daily. Will add ranitidine 150 mg in the evening to help with nocturnal symptoms. Discontinue omeprazole. Antireflux measures reviewed and recommended including not eating or drinking within 2 hours of lying down, propping the head of the bed, and avoiding trigger foods such as caffeine and alcohol. If symptoms fail to improve further evaluation will be performed. She is happy with this plan, return to the office in 2-3 months, sooner if necessary.

## 2014-10-07 NOTE — Telephone Encounter (Signed)
Attempted to contact patient, no answer, will attempt to call again.

## 2014-10-07 NOTE — Patient Instructions (Signed)
Please discontinue omeprazole  We have sent the following medications to your pharmacy for you to pick up at your convenience: Pantoprazole 40 mg 30 minutes before breakfast Ranitidine 150 mg every evening  Please follow up in 8-12 weeks with Dr Hilarie Fredrickson.   Call our office at [phone] if symptoms worsen or do not improve.  Food Choices for Gastroesophageal Reflux Disease When you have gastroesophageal reflux disease (GERD), the foods you eat and your eating habits are very important. Choosing the right foods can help ease the discomfort of GERD. WHAT GENERAL GUIDELINES DO I NEED TO FOLLOW?  Choose fruits, vegetables, whole grains, low-fat dairy products, and low-fat meat, fish, and poultry.  Limit fats such as oils, salad dressings, butter, nuts, and avocado.  Keep a food diary to identify foods that cause symptoms.  Avoid foods that cause reflux. These may be different for different people.  Eat frequent small meals instead of three large meals each day.  Eat your meals slowly, in a relaxed setting.  Limit fried foods.  Cook foods using methods other than frying.  Avoid drinking alcohol.  Avoid drinking large amounts of liquids with your meals.  Avoid bending over or lying down until 2-3 hours after eating. WHAT FOODS ARE NOT RECOMMENDED? The following are some foods and drinks that may worsen your symptoms: Vegetables Tomatoes. Tomato juice. Tomato and spaghetti sauce. Chili peppers. Onion and garlic. Horseradish. Fruits Oranges, grapefruit, and lemon (fruit and juice). Meats High-fat meats, fish, and poultry. This includes hot dogs, ribs, ham, sausage, salami, and bacon. Dairy Whole milk and chocolate milk. Sour cream. Cream. Butter. Ice cream. Cream cheese.  Beverages Coffee and tea, with or without caffeine. Carbonated beverages or energy drinks. Condiments Hot sauce. Barbecue sauce.  Sweets/Desserts Chocolate and cocoa. Donuts. Peppermint and spearmint. Fats  and Oils High-fat foods, including [place] fries and potato chips. Other Vinegar. Strong spices, such as black pepper, white pepper, red pepper, cayenne, curry powder, cloves, ginger, and chili powder. The items listed above may not be a complete list of foods and beverages to avoid. Contact your dietitian for more information. Document Released: 10/08/2005 Document Revised: 10/13/2013 Document Reviewed: 08/12/2013 [facility] Patient Information 2015 [place], [place]. This information is not intended to replace advice given to you by your health care provider. Make sure you discuss any questions you have with your health care provider.   Gastroesophageal Reflux Disease, Adult Gastroesophageal reflux disease (GERD) happens when acid from your stomach flows up into the esophagus. When acid comes in contact with the esophagus, the acid causes soreness (inflammation) in the esophagus. Over time, GERD may create small holes (ulcers) in the lining of the esophagus. CAUSES   Increased body weight. This puts pressure on the stomach, making acid rise from the stomach into the esophagus.  Smoking. This increases acid production in the stomach.  Drinking alcohol. This causes decreased pressure in the lower esophageal sphincter (valve or ring of muscle between the esophagus and stomach), allowing acid from the stomach into the esophagus.  Late evening meals and a full stomach. This increases pressure and acid production in the stomach.  A malformed lower esophageal sphincter. Sometimes, no cause is found. SYMPTOMS   Burning pain in the lower part of the mid-chest behind the breastbone and in the mid-stomach area. This may occur twice a week or more often.  Trouble swallowing.  Sore throat.  Dry cough.  Asthma-like symptoms including chest tightness, shortness of breath, or wheezing. DIAGNOSIS  Your caregiver may be  able to diagnose GERD based on your symptoms. In some cases, X-rays and other tests  may be done to check for complications or to check the condition of your stomach and esophagus. TREATMENT  Your caregiver may recommend over-the-counter or prescription medicines to help decrease acid production. Ask your caregiver before starting or adding any new medicines.  HOME CARE INSTRUCTIONS   Change the factors that you can control. Ask your caregiver for guidance concerning weight loss, quitting smoking, and alcohol consumption.  Avoid foods and drinks that make your symptoms worse, such as:  Caffeine or alcoholic drinks.  Chocolate.  Peppermint or mint flavorings.  Garlic and onions.  Spicy foods.  Citrus fruits, such as oranges, lemons, or limes.  Tomato-based foods such as sauce, chili, salsa, and pizza.  Fried and fatty foods.  Avoid lying down for the 3 hours prior to your bedtime or prior to taking a nap.  Eat small, frequent meals instead of large meals.  Wear loose-fitting clothing. Do not wear anything tight around your waist that causes pressure on your stomach.  Raise the head of your bed 6 to 8 inches with wood blocks to help you sleep. Extra pillows will not help.  Only take over-the-counter or prescription medicines for pain, discomfort, or fever as directed by your caregiver.  Do not take aspirin, ibuprofen, or other nonsteroidal anti-inflammatory drugs (NSAIDs). SEEK IMMEDIATE MEDICAL CARE IF:   You have pain in your arms, neck, jaw, teeth, or back.  Your pain increases or changes in intensity or duration.  You develop nausea, vomiting, or sweating (diaphoresis).  You develop shortness of breath, or you faint.  Your vomit is green, yellow, black, or looks like coffee grounds or blood.  Your stool is red, bloody, or black. These symptoms could be signs of other problems, such as heart disease, gastric bleeding, or esophageal bleeding. MAKE SURE YOU:   Understand these instructions.  Will watch your condition.  Will get help right away  if you are not doing well or get worse. Document Released: 07/18/2005 Document Revised: 12/31/2011 Document Reviewed: 04/27/2011 [facility] Patient Information 2015 [place], [place]. This information is not intended to replace advice given to you by your health care provider. Make sure you discuss any questions you have with your health care provider.

## 2014-10-08 ENCOUNTER — Encounter: Payer: Self-pay | Admitting: Family Medicine

## 2014-11-08 ENCOUNTER — Ambulatory Visit: Payer: 59 | Admitting: Family Medicine

## 2014-11-19 ENCOUNTER — Encounter: Payer: Self-pay | Admitting: *Deleted

## 2014-12-10 ENCOUNTER — Encounter: Payer: Self-pay | Admitting: Internal Medicine

## 2014-12-10 ENCOUNTER — Ambulatory Visit (INDEPENDENT_AMBULATORY_CARE_PROVIDER_SITE_OTHER): Payer: 59 | Admitting: Internal Medicine

## 2014-12-10 VITALS — BP 126/74 | HR 82 | Ht 65.25 in | Wt 175.0 lb

## 2014-12-10 DIAGNOSIS — R1310 Dysphagia, unspecified: Secondary | ICD-10-CM

## 2014-12-10 DIAGNOSIS — IMO0001 Reserved for inherently not codable concepts without codable children: Secondary | ICD-10-CM

## 2014-12-10 DIAGNOSIS — K219 Gastro-esophageal reflux disease without esophagitis: Secondary | ICD-10-CM

## 2014-12-10 DIAGNOSIS — R112 Nausea with vomiting, unspecified: Secondary | ICD-10-CM

## 2014-12-10 DIAGNOSIS — R1110 Vomiting, unspecified: Secondary | ICD-10-CM

## 2014-12-10 MED ORDER — ESOMEPRAZOLE MAGNESIUM 40 MG PO CPDR: 40.0000 mg | DELAYED_RELEASE_CAPSULE | Freq: Every day | ORAL | Status: DC

## 2014-12-10 NOTE — Patient Instructions (Addendum)
We have sent the following medications to your pharmacy for you to pick up at your convenience: Nexium 40 mg 30 minutes before breakfast meal x 2 months. You may then begin taking Protonix again.  Please continue your ranitidine 150 mg every night.  Please try a different over the counter allergy medication that is comparable to Allegra to see if this helps with your difficulty swallowing.  Call our office at [phone] should you notice a large difference in symptoms with change to Nexium  Follow up with Dr Hilarie Fredrickson in 6 months.  Follow antireflux measures (see information below). Avoid laying on your left side and prop up the head of your bed.  Food Choices for Gastroesophageal Reflux Disease When you have gastroesophageal reflux disease (GERD), the foods you eat and your eating habits are very important. Choosing the right foods can help ease the discomfort of GERD. WHAT GENERAL GUIDELINES DO I NEED TO FOLLOW?  Choose fruits, vegetables, whole grains, low-fat dairy products, and low-fat meat, fish, and poultry.  Limit fats such as oils, salad dressings, butter, nuts, and avocado.  Keep a food diary to identify foods that cause symptoms.  Avoid foods that cause reflux. These may be different for different people.  Eat frequent small meals instead of three large meals each day.  Eat your meals slowly, in a relaxed setting.  Limit fried foods.  Cook foods using methods other than frying.  Avoid drinking alcohol.  Avoid drinking large amounts of liquids with your meals.  Avoid bending over or lying down until 2-3 hours after eating. WHAT FOODS ARE NOT RECOMMENDED? The following are some foods and drinks that may worsen your symptoms: Vegetables Tomatoes. Tomato juice. Tomato and spaghetti sauce. Chili peppers. Onion and garlic. Horseradish. Fruits Oranges, grapefruit, and lemon (fruit and juice). Meats High-fat meats, fish, and poultry. This includes hot dogs, ribs, ham,  sausage, salami, and bacon. Dairy Whole milk and chocolate milk. Sour cream. Cream. Butter. Ice cream. Cream cheese.  Beverages Coffee and tea, with or without caffeine. Carbonated beverages or energy drinks. Condiments Hot sauce. Barbecue sauce.  Sweets/Desserts Chocolate and cocoa. Donuts. Peppermint and spearmint. Fats and Oils High-fat foods, including [place] fries and potato chips. Other Vinegar. Strong spices, such as black pepper, white pepper, red pepper, cayenne, curry powder, cloves, ginger, and chili powder. The items listed above may not be a complete list of foods and beverages to avoid. Contact your dietitian for more information. Document Released: 10/08/2005 Document Revised: 10/13/2013 Document Reviewed: 08/12/2013 [facility] Patient Information 2015 [place], [place]. This information is not intended to replace advice given to you by your health care provider. Make sure you discuss any questions you have with your health care provider.  Gastroesophageal Reflux Disease, Adult Gastroesophageal reflux disease (GERD) happens when acid from your stomach flows up into the esophagus. When acid comes in contact with the esophagus, the acid causes soreness (inflammation) in the esophagus. Over time, GERD may create small holes (ulcers) in the lining of the esophagus. CAUSES   Increased body weight. This puts pressure on the stomach, making acid rise from the stomach into the esophagus.  Smoking. This increases acid production in the stomach.  Drinking alcohol. This causes decreased pressure in the lower esophageal sphincter (valve or ring of muscle between the esophagus and stomach), allowing acid from the stomach into the esophagus.  Late evening meals and a full stomach. This increases pressure and acid production in the stomach.  A malformed lower esophageal sphincter. Sometimes, no  cause is found. SYMPTOMS   Burning pain in the lower part of the mid-chest behind the  breastbone and in the mid-stomach area. This may occur twice a week or more often.  Trouble swallowing.  Sore throat.  Dry cough.  Asthma-like symptoms including chest tightness, shortness of breath, or wheezing. DIAGNOSIS  Your caregiver may be able to diagnose GERD based on your symptoms. In some cases, X-rays and other tests may be done to check for complications or to check the condition of your stomach and esophagus. TREATMENT  Your caregiver may recommend over-the-counter or prescription medicines to help decrease acid production. Ask your caregiver before starting or adding any new medicines.  HOME CARE INSTRUCTIONS   Change the factors that you can control. Ask your caregiver for guidance concerning weight loss, quitting smoking, and alcohol consumption.  Avoid foods and drinks that make your symptoms worse, such as:  Caffeine or alcoholic drinks.  Chocolate.  Peppermint or mint flavorings.  Garlic and onions.  Spicy foods.  Citrus fruits, such as oranges, lemons, or limes.  Tomato-based foods such as sauce, chili, salsa, and pizza.  Fried and fatty foods.  Avoid lying down for the 3 hours prior to your bedtime or prior to taking a nap.  Eat small, frequent meals instead of large meals.  Wear loose-fitting clothing. Do not wear anything tight around your waist that causes pressure on your stomach.  Raise the head of your bed 6 to 8 inches with wood blocks to help you sleep. Extra pillows will not help.  Only take over-the-counter or prescription medicines for pain, discomfort, or fever as directed by your caregiver.  Do not take aspirin, ibuprofen, or other nonsteroidal anti-inflammatory drugs (NSAIDs). SEEK IMMEDIATE MEDICAL CARE IF:   You have pain in your arms, neck, jaw, teeth, or back.  Your pain increases or changes in intensity or duration.  You develop nausea, vomiting, or sweating (diaphoresis).  You develop shortness of breath, or you  faint.  Your vomit is green, yellow, black, or looks like coffee grounds or blood.  Your stool is red, bloody, or black. These symptoms could be signs of other problems, such as heart disease, gastric bleeding, or esophageal bleeding. MAKE SURE YOU:   Understand these instructions.  Will watch your condition.  Will get help right away if you are not doing well or get worse. Document Released: 07/18/2005 Document Revised: 12/31/2011 Document Reviewed: 04/27/2011 [facility] Patient Information 2015 [place], [place]. This information is not intended to replace advice given to you by your health care provider. Make sure you discuss any questions you have with your health care provider.  CC: Dr Ree Kida

## 2014-12-10 NOTE — Progress Notes (Signed)
Subjective:    Patient ID: Gina Wise, female    DOB: Sep 09, 1961, 54 y.o.   MRN: [id number]  HPI Ashani Pumphrey is a 54 yo female with PMH of GERD, Schatzki's ring, diverticulosis, hypothyroidism, anxiety and depression who is seen in follow-up. She was last seen on 10/07/2014. After her last visit her PPI was changed from omeprazole 20 to pantoprazole 40 mg and ranitidine was added 150 mg at night. She reports there has been a "big improvement" in cough overall, throat clearing and she no longer has globus sensation. She has noticed regurgitation during sleep. This will wait her up with acid taste in her mouth and occasional coughing. This is despite trying to prop her head up with pillows and avoiding eating and drinking late at night. She notices this more when lying on her left side. She has issues swallowing her Allegra tablet and feels like it sticks in her throat. The remaining medications that she takes has no trouble transiting to the stomach. She does report sore throat. She denies dysphagia or odynophagia. She does chew her food well and try to take relatively small bites. She denies nausea or vomiting. Appetite has been "too good". Bowel movements regular without blood in her stool or melena. Stress has been improved she is recently paid off a car alone and her roommate is no longer dealing with cancer in his family, though his brother did die from brain cancer in January. He has been working a lot lately.  Review of Systems As per history of present illness, otherwise negative  Current Medications, Allergies, Past Medical History, Past Surgical History, Family History and Social History were reviewed in [employer] record.     Objective:   Physical Exam BP 126/74 mmHg  Pulse 82  Ht 5' 5.25" (1.657 m)  Wt 175 lb (79.379 kg)  BMI 28.91 kg/m2  LMP 04/03/2012 Constitutional: Well-developed and well-nourished. No distress. HEENT: Normocephalic and atraumatic.  Oropharynx is clear and moist. No oropharyngeal exudate. Conjunctivae are normal.  No scleral icterus. Neck: Neck supple. Trachea midline. Cardiovascular: Normal rate, regular rhythm and intact distal pulses. No M/R/G Pulmonary/chest: Effort normal and breath sounds normal. No wheezing, rales or rhonchi. Abdominal: Soft, nontender, nondistended. Bowel sounds active throughout. There are no masses palpable. No hepatosplenomegaly. Extremities: no clubbing, cyanosis, or edema Lymphadenopathy: No cervical adenopathy noted. Neurological: Alert and oriented to person place and time. Skin: Skin is warm and dry. No rashes noted. Psychiatric: Normal mood and affect. Behavior is normal.  EGD/colonoscopy from August 2015 reviewed      Assessment & Plan:  54 yo female with PMH of GERD, Schatzki's ring, diverticulosis, hypothyroidism, anxiety and depression who is seen in follow-up.  1. GERD with pill dysphagia -- she has benefited from changed pantoprazole 40 mg daily with ranitidine 150 at night. Her cough, throat clearing and globus sensation is significant early better. I'm not convinced her sore throat is GERD related given how most symptoms have improved. She reports needing to switch to a different PPI medication for insurance purposes for the next 2 months. With this in mind we'll change to Nexium 40 mg daily. She will continue ranitidine 150 mg at bedtime. I've encouraged antireflux measures including elevating the head of her bed at least 6 inches, again avoiding eating and drinking within 90 minutes of bedtime and avoid lying on her left side as this tends to be a precipitant. I would also recommend changing Allegra to the generic form to see  if this pill is easier for her to swallow. I do not feel that recurrent dilation at this time would benefit her isolated pill dysphagia. She understands this and agrees  Return in 6 months, sooner as needed

## 2014-12-13 ENCOUNTER — Encounter: Payer: Self-pay | Admitting: Family Medicine

## 2014-12-13 ENCOUNTER — Ambulatory Visit (INDEPENDENT_AMBULATORY_CARE_PROVIDER_SITE_OTHER): Payer: 59 | Admitting: Family Medicine

## 2014-12-13 VITALS — BP 135/85 | HR 91 | Temp 98.2°F | Ht 65.0 in | Wt 174.4 lb

## 2014-12-13 DIAGNOSIS — H1013 Acute atopic conjunctivitis, bilateral: Secondary | ICD-10-CM

## 2014-12-13 DIAGNOSIS — F331 Major depressive disorder, recurrent, moderate: Secondary | ICD-10-CM

## 2014-12-13 DIAGNOSIS — E039 Hypothyroidism, unspecified: Secondary | ICD-10-CM

## 2014-12-13 DIAGNOSIS — H1010 Acute atopic conjunctivitis, unspecified eye: Secondary | ICD-10-CM | POA: Insufficient documentation

## 2014-12-13 LAB — TSH: TSH: 0.632 u[IU]/mL (ref 0.350–4.500)

## 2014-12-13 NOTE — Progress Notes (Signed)
   Subjective:    Patient ID: Gina Wise, female    DOB: [birth year], 54 y.o.   MRN: [id number]  HPI 54 year old female presents for routine follow-up  Hypothyroidism-currently on 100 g Synthroid daily, due for recheck of TSH today, reports mild hair thinning, mild dry skin, no constipation  Depressed mood-currently on Zoloft 50 mg daily, to not increase to 100 mg has previously discussed, however patient reports improvement of her mood, she is sleeping better with treatment of her GERD, denies homicidal or suicidal thoughts, no difficulty with concentration  Allergic rhinitis/history of present illness eyes-patient reports worsening itchy eyes as her roommate recently got a new bird, she has been taking Allegra daily which help her symptoms, previously on Flonase however not taking at this time  Social-nonsmoker, very infrequent alcohol use   Review of Systems  Constitutional: Negative for fever, chills and fatigue.  Respiratory: Negative for cough and shortness of breath.   Cardiovascular: Negative for chest pain.       Objective:   Physical Exam Vitals: Reviewed next in general: Pleasant female, no acute distress HEENT: Normocephalic, pupils equal round and reactive to light, extraocular movements are intact, mild conjunctival erythema and irritation present bilaterally, nasal septum midline, no rhinorrhea, moist mucous members, uvula midline, neck supple, no thyromegaly, no anterior posterior cervical lymphadenopathy Cardiac: Regular rate and rhythm, S1 and S2 present, no murmurs, no heaves or thrills Respiratory: Clear to auscultation bilaterally, normal effort Psych: Appropriately dressed, no flight of ideas, affect is normal, mood is slightly depressed however improved from previous, no SI or HI  Reviewed most recent thyroid testing     Assessment & Plan:  Please see problem specific assessment and plan.

## 2014-12-13 NOTE — Assessment & Plan Note (Signed)
Mood is improved. Insomnia is also improved. -Continue Zoloft 50 mg daily -Plan to wean as tolerated

## 2014-12-13 NOTE — Patient Instructions (Signed)
Mood/Depression - continue zoloft 50 mg daily  Itchy eyes/allergies - start Flonase 2 sprays each nostril daily, continue Allegra (Fexofenadine)  Thyroid - check TSH today, Dr. Ree Kida will call you with your results.

## 2014-12-13 NOTE — Assessment & Plan Note (Signed)
Last TSH below normal limits, no changes were made to Synthroid dose at that time. -Recheck TSH and adjust Synthroid dose accordingly

## 2014-12-13 NOTE — Assessment & Plan Note (Addendum)
Patient has symptoms consistent with allergic conjunctivitis -Counseled on trigger avoidance -Continue Allegra daily -Add daily Flonase

## 2014-12-15 NOTE — Progress Notes (Signed)
Discussed TSH with patient.

## 2015-02-14 ENCOUNTER — Encounter: Payer: Self-pay | Admitting: *Deleted

## 2015-02-16 ENCOUNTER — Encounter: Payer: Self-pay | Admitting: Family Medicine

## 2015-02-16 ENCOUNTER — Ambulatory Visit (INDEPENDENT_AMBULATORY_CARE_PROVIDER_SITE_OTHER): Payer: 59 | Admitting: Family Medicine

## 2015-02-16 VITALS — BP 127/74 | HR 85 | Ht 65.0 in | Wt 173.0 lb

## 2015-02-16 DIAGNOSIS — L500 Allergic urticaria: Secondary | ICD-10-CM

## 2015-02-16 DIAGNOSIS — B029 Zoster without complications: Secondary | ICD-10-CM | POA: Diagnosis not present

## 2015-02-16 HISTORY — DX: Allergic urticaria: L50.0

## 2015-02-16 MED ORDER — VALACYCLOVIR HCL 1 G PO TABS: 1000.0000 mg | ORAL_TABLET | Freq: Three times a day (TID) | ORAL | Status: DC

## 2015-02-16 MED ORDER — HYDROXYZINE HCL 50 MG PO TABS: 50.0000 mg | ORAL_TABLET | Freq: Four times a day (QID) | ORAL | Status: DC | PRN

## 2015-02-16 NOTE — Patient Instructions (Signed)
I believe the rash on your neck is Shingles - the face medical name is Herpes Zoster Google shingles to learn more.   I will prescribe an antiviral medication - valcyclovir for the shingles I will prescribe a good antihistamine for the hives. Use sunscreen at the beach. If the neck rash gets worse, we should see you again. I hope you don't develop post herpetic neuralgia.  I doubt that will, it is most common with severe cases.

## 2015-02-17 NOTE — Assessment & Plan Note (Signed)
Zoster - will treat with valcyclovir even though 7 days out to try to decrease post herpetic neuralgia.

## 2015-02-17 NOTE — Assessment & Plan Note (Signed)
By history.  Treat with atarax (says benadryl does not help or make sleepy.)  She will look for possible allergens.

## 2015-02-17 NOTE — Progress Notes (Signed)
   Subjective:    Patient ID: Gina Wise, female    DOB: Aug 12, 1961, 54 y.o.   MRN: [id number]  HPI Patient with pruritic, mildly painful rash on left neck for one week.  Started as a single blister.  Not immunocompromised.  Does have increased stress.  Will be taking her first vacation in years in two weeks.   For past two days, having "hives" and itching all over body.  No new meds.  No allergic exposure to her knowledge.    Review of Systems     Objective:   Physical Exam Neck shows rash typical of zoster in left supraclavicular area. No hives at present or other skin lesions        Assessment & Plan:

## 2015-02-18 ENCOUNTER — Telehealth: Payer: Self-pay | Admitting: Family Medicine

## 2015-02-18 NOTE — Telephone Encounter (Signed)
Recently prescribed  valACYclovir (VALTREX) 1000 MG tablet as well as hydrOXYzine (ATARAX/VISTARIL) 50 MG tablet, and now she is breaking out in bruises and would like to know if she should continue medication(s) / thanks [employer], ASA

## 2015-02-18 NOTE — Telephone Encounter (Signed)
Patient reports bruising on arms and legs this AM, no known trauma however does reports diffuse itching over the past few day. Reviewed medications, no known blood thinner, Acyclovir has less than 3% change of thrombocytopenia, no active bleeding, patient to monitor and if worsens to be seen in office for acute visit or urgent care

## 2015-02-24 ENCOUNTER — Encounter: Payer: Self-pay | Admitting: Family Medicine

## 2015-02-24 ENCOUNTER — Ambulatory Visit (INDEPENDENT_AMBULATORY_CARE_PROVIDER_SITE_OTHER): Payer: 59 | Admitting: Family Medicine

## 2015-02-24 VITALS — BP 152/84 | HR 79 | Temp 98.5°F | Wt 176.0 lb

## 2015-02-24 DIAGNOSIS — L237 Allergic contact dermatitis due to plants, except food: Secondary | ICD-10-CM

## 2015-02-24 DIAGNOSIS — L500 Allergic urticaria: Secondary | ICD-10-CM

## 2015-02-24 MED ORDER — PREDNISONE 20 MG PO TABS: ORAL_TABLET | ORAL | Status: DC

## 2015-02-24 MED ORDER — METHYLPREDNISOLONE ACETATE 40 MG/ML IJ SUSP
40.0000 mg | Freq: Once | INTRAMUSCULAR | Status: AC
  Administered 2015-02-24: 40 mg via INTRAMUSCULAR

## 2015-02-24 NOTE — Patient Instructions (Signed)
Take prednisone 40mg  daily for 1 week (two 20mg  pills) Then take 20mg  daily for 1 week (one 20mg  pill)  Return if not improving within several days  Be well, Dr. Ardelia Mems     [facility] ivy is a inflammation of the skin (contact dermatitis) caused by touching the allergens on the leaves of the ivy plant following previous exposure to the plant. The rash usually appears 48 hours after exposure. The rash is usually bumps (papules) or blisters (vesicles) in a linear pattern. Depending on your own sensitivity, the rash may simply cause redness and itching, or it may also progress to blisters which may break open. These must be well cared for to prevent secondary bacterial (germ) infection, followed by scarring. Keep any open areas dry, clean, dressed, and covered with an antibacterial ointment if needed. The eyes may also get puffy. The puffiness is worst in the morning and gets better as the day progresses. This dermatitis usually heals without scarring, within 2 to 3 weeks without treatment. HOME CARE INSTRUCTIONS  Thoroughly wash with soap and water as soon as you have been exposed to poison ivy. You have about one half hour to remove the plant resin before it will cause the rash. This washing will destroy the oil or antigen on the skin that is causing, or will cause, the rash. Be sure to wash under your fingernails as any plant resin there will continue to spread the rash. Do not rub skin vigorously when washing affected area. Poison ivy cannot spread if no oil from the plant remains on your body. A rash that has progressed to weeping sores will not spread the rash unless you have not washed thoroughly. It is also important to wash any clothes you have been wearing as these may carry active allergens. The rash will return if you wear the unwashed clothing, even several days later. Avoidance of the plant in the future is the best measure. Poison ivy plant can be recognized by the number of  leaves. Generally, poison ivy has three leaves with flowering branches on a single stem. Diphenhydramine may be purchased over the counter and used as needed for itching. Do not drive with this medication if it makes you drowsy.Ask your caregiver about medication for children. SEEK MEDICAL CARE IF:  Open sores develop.  Redness spreads beyond area of rash.  You notice purulent (pus-like) discharge.  You have increased pain.  Other signs of infection develop (such as fever). Document Released: 10/05/2000 Document Revised: 12/31/2011 Document Reviewed: 03/18/2009 [facility] Patient Information 2015 [place], [place]. This information is not intended to replace advice given to you by your health care provider. Make sure you discuss any questions you have with your health care provider.

## 2015-02-24 NOTE — Progress Notes (Signed)
Patient ID: Gina Wise, female   DOB: Feb 20, 1961, 54 y.o.   MRN: [id number]  HPI:  Pt presents for a same day appointment to discuss worsening rash on neck.   Was seen on 4/27 with rash to L side of neck and diagnosed with shingles. Given rx for valtrex which she has taken. Now having spreading around L side of neck. Also has blister on R hand and R side/shoulder. No lesions in mouth or genitals. No new medications or foods. No fever. Was exposed to bird prior to this rash starting (cock[place]il she rescued from her backyard). Intensely pruritic. Has tried benadryl, hydrocortisone, desitin. Rash has been present for 2 weeks, not improving, getting worse.  ROS: See HPI  [place]: hx migraine, HTN, GERD, hyopthyroidism, depression  PHYSICAL EXAM: BP 152/84 mmHg  Pulse 79  Temp(Src) 98.5 F (36.9 C) (Oral)  Wt 176 lb (79.833 kg)  LMP 04/03/2012 Gen: NAD, pleasant, cooperative HEENT: NCAT. Oropharynx clear and moist without lesions Skin: erythematous vesicular/papular rash on Lside of neck, wrapping around. Does not cross midline. Similar vesicle group on R hand and R shoulder. No signs of superinfection or drainage  ASSESSMENT/PLAN:  1. Rash - since rash has spread and is worsening over weeks, doubt shingles as etiology. Suspect poison ivy hypersensitivity reaction. Will rx prednisone 2 week course. Pt unable to pick up rx until tomorrow so given 40mg  IM of depomedrol in clinic today to get relief faster. She will f/u if not improving.  FOLLOW UP: F/u as needed if symptoms worsen or do not improve.   [place]. Ardelia Mems, [place]

## 2015-03-09 ENCOUNTER — Encounter: Payer: Self-pay | Admitting: Internal Medicine

## 2015-03-16 ENCOUNTER — Encounter: Payer: Self-pay | Admitting: Internal Medicine

## 2015-03-18 ENCOUNTER — Ambulatory Visit (INDEPENDENT_AMBULATORY_CARE_PROVIDER_SITE_OTHER): Payer: 59 | Admitting: Family Medicine

## 2015-03-18 ENCOUNTER — Encounter: Payer: Self-pay | Admitting: Family Medicine

## 2015-03-18 VITALS — BP 141/87 | HR 81 | Temp 97.7°F | Ht 66.0 in | Wt 178.9 lb

## 2015-03-18 DIAGNOSIS — G43019 Migraine without aura, intractable, without status migrainosus: Secondary | ICD-10-CM

## 2015-03-18 MED ORDER — SUMATRIPTAN SUCCINATE 6 MG/0.5ML ~~LOC~~ SOLN
6.0000 mg | Freq: Once | SUBCUTANEOUS | Status: AC
  Administered 2015-03-18: 6 mg via SUBCUTANEOUS

## 2015-03-18 MED ORDER — PROMETHAZINE HCL 25 MG/ML IJ SOLN: 25.0000 mg | Freq: Once | INTRAMUSCULAR | Status: DC

## 2015-03-18 MED ORDER — PROMETHAZINE HCL 25 MG/ML IJ SOLN
25.0000 mg | Freq: Once | INTRAMUSCULAR | Status: AC
  Administered 2015-03-18: 25 mg via INTRAMUSCULAR

## 2015-03-18 MED ORDER — ONDANSETRON 4 MG PO TBDP: 4.0000 mg | ORAL_TABLET | Freq: Three times a day (TID) | ORAL | Status: DC | PRN

## 2015-03-18 MED ORDER — KETOROLAC TROMETHAMINE 60 MG/2ML IM SOLN
60.0000 mg | Freq: Once | INTRAMUSCULAR | Status: AC
  Administered 2015-03-18: 60 mg via INTRAMUSCULAR

## 2015-03-18 NOTE — Addendum Note (Signed)
Addended by: [name], [name] on: 03/18/2015 03:58 PM   Modules accepted: Orders

## 2015-03-18 NOTE — Addendum Note (Signed)
Addended by: Kennith Maes R on: 03/18/2015 04:01 PM   Modules accepted: Orders

## 2015-03-18 NOTE — Progress Notes (Addendum)
  Subjective:    Gina Wise is a 54 y.o. female who presents for evaluation of headache. Symptoms began about 5 hrs ago. Generally, the headaches last about 4 days and occur rarley. The headaches do not seem to be related to any time of the day. The headaches are usually sharp and are located in L side temporal region.  The patient rates her most severe headaches a 8 on a scale from 1 to 10. Recently, the headaches have been stable. Work attendance or other daily activities are affected by the headaches. Precipitating factors include: none which have been determined. The headaches are usually preceded by an aura consisting of visual scintillations. Associated neurologic symptoms: decreased physical activity. The patient denies dizziness, loss of balance, muscle weakness, numbness of extremities, speech difficulties and vomiting in the early morning. Home treatment has included Imitrex oral with some improvement. Other history includes: migraine headaches diagnosed in the past. Family history includes no known family members with significant headaches.  The following portions of the patient's history were reviewed and updated as appropriate: allergies, current medications, past family history, past medical history, past social history, past surgical history and problem list.  Review of Systems Pertinent items are noted in HPI.    Objective:    BP 141/87 mmHg  Pulse 81  Temp(Src) 97.7 F (36.5 C) (Oral)  Ht 5\' 6"  (1.676 m)  Wt 178 lb 14.4 oz (81.149 kg)  BMI 28.89 kg/m2  LMP 04/03/2012 Neck: Negative Brudzinski/Kernig sign  Heart: regular rate and rhythm Neurologic: Alert and oriented X 3, normal strength and tone. Normal symmetric reflexes. Normal coordination and gait    Assessment:    Intractable migraine    Plan:    Lie in darkened room and apply cold packs as needed for pain. Side effect profile discussed in detail. Asked to keep headache diary. Patient reassured that  neurodiagnostic workup not indicated from benign H&P. No red flags on exam, not worst HA of life or focal neurologic deficits and has Hx of migraines, which she states is very simila to this one.  Toradol 60 mg along with Phenergan 25 mg injection.  Pt tolerated well but does not feel much better.    After 15 minutes, shot of Imitrex 6 mg SQ given for further relief.  Last Imitrex PO dose > 6 hours ago.  Did receive some relief from this and recommended benadryl when she gets home in addition.  If any red flags including  fever, chills, night time awakenings secondary to pain, weakness in one or both legs, dysarthria or aphasia, needs to go to ED

## 2015-03-23 ENCOUNTER — Other Ambulatory Visit: Payer: Self-pay | Admitting: Family Medicine

## 2015-03-23 DIAGNOSIS — L500 Allergic urticaria: Secondary | ICD-10-CM

## 2015-03-23 NOTE — Telephone Encounter (Signed)
Need refill on on the hydroxyzine and prednisone for her poison ivy outbreak.  She was seen for condition and used the first round of these meds, but not better.  Call her back on her cell or her work number [phone].

## 2015-03-24 MED ORDER — HYDROXYZINE HCL 50 MG PO TABS: 50.0000 mg | ORAL_TABLET | Freq: Four times a day (QID) | ORAL | Status: DC | PRN

## 2015-03-24 MED ORDER — PREDNISONE 20 MG PO TABS: ORAL_TABLET | ORAL | Status: DC

## 2015-03-24 NOTE — Telephone Encounter (Signed)
Patient had poison ivy a month ago. Improved with prednisone. Completely gone for two weeks. Now had recurrence on head and neck. Will send prednisone and hydroxyzine to pharmacy. She has scrubbed her work space/home/washed sheets/washed the dog. Unsure of source of recurrence.

## 2015-03-30 ENCOUNTER — Other Ambulatory Visit: Payer: Self-pay | Admitting: Family Medicine

## 2015-04-05 ENCOUNTER — Telehealth: Payer: Self-pay | Admitting: *Deleted

## 2015-04-05 NOTE — Telephone Encounter (Signed)
Error

## 2015-04-14 ENCOUNTER — Ambulatory Visit (AMBULATORY_SURGERY_CENTER): Payer: Self-pay

## 2015-04-14 VITALS — Ht 66.0 in | Wt 185.0 lb

## 2015-04-14 DIAGNOSIS — K2270 Barrett's esophagus without dysplasia: Secondary | ICD-10-CM

## 2015-04-14 NOTE — Progress Notes (Signed)
No diet drugs Not on home 02 No egg or soy allergies No anesthesia complications

## 2015-04-28 ENCOUNTER — Ambulatory Visit (AMBULATORY_SURGERY_CENTER): Payer: 59 | Admitting: Internal Medicine

## 2015-04-28 ENCOUNTER — Encounter: Payer: Self-pay | Admitting: Internal Medicine

## 2015-04-28 VITALS — BP 130/80 | HR 80 | Temp 97.9°F | Resp 15 | Ht 66.0 in | Wt 185.0 lb

## 2015-04-28 DIAGNOSIS — K2970 Gastritis, unspecified, without bleeding: Secondary | ICD-10-CM

## 2015-04-28 DIAGNOSIS — K259 Gastric ulcer, unspecified as acute or chronic, without hemorrhage or perforation: Secondary | ICD-10-CM | POA: Diagnosis not present

## 2015-04-28 DIAGNOSIS — K219 Gastro-esophageal reflux disease without esophagitis: Secondary | ICD-10-CM

## 2015-04-28 DIAGNOSIS — K2990 Gastroduodenitis, unspecified, without bleeding: Secondary | ICD-10-CM | POA: Diagnosis not present

## 2015-04-28 DIAGNOSIS — K208 Other esophagitis: Secondary | ICD-10-CM | POA: Diagnosis not present

## 2015-04-28 DIAGNOSIS — K2270 Barrett's esophagus without dysplasia: Secondary | ICD-10-CM

## 2015-04-28 MED ORDER — ESOMEPRAZOLE MAGNESIUM 40 MG PO CPDR
40.0000 mg | DELAYED_RELEASE_CAPSULE | Freq: Two times a day (BID) | ORAL | Status: DC
Start: 2015-04-28 — End: 2015-12-16

## 2015-04-28 MED ORDER — SODIUM CHLORIDE 0.9 % IV SOLN: 500.0000 mL | INTRAVENOUS | Status: DC

## 2015-04-28 NOTE — Patient Instructions (Signed)
YOU HAD AN ENDOSCOPIC PROCEDURE TODAY AT [facility]:   Refer to the procedure report that was given to you for any specific questions about what was found during the examination.  If the procedure report does not answer your questions, please call your gastroenterologist to clarify.  If you requested that your care partner not be given the details of your procedure findings, then the procedure report has been included in a sealed envelope for you to review at your convenience later.  YOU SHOULD EXPECT: Some feelings of bloating in the abdomen. Passage of more gas than usual.  Walking can help get rid of the air that was put into your GI tract during the procedure and reduce the bloating. If you had a lower endoscopy (such as a colonoscopy or flexible sigmoidoscopy) you may notice spotting of blood in your stool or on the toilet paper. If you underwent a bowel prep for your procedure, you may not have a normal bowel movement for a few days.  Please Note:  You might notice some irritation and congestion in your nose or some drainage.  This is from the oxygen used during your procedure.  There is no need for concern and it should clear up in a day or so.  SYMPTOMS TO REPORT IMMEDIATELY:   Following upper endoscopy (EGD)  Vomiting of blood or coffee ground material  New chest pain or pain under the shoulder blades  Painful or persistently difficult swallowing  New shortness of breath  Fever of 100F or higher  Black, tarry-looking stools  For urgent or emergent issues, a gastroenterologist can be reached at any hour by calling [phone].   DIET: Your first meal following the procedure should be a small meal and then it is ok to progress to your normal diet. Heavy or fried foods are harder to digest and may make you feel nauseous or bloated.  Likewise, meals heavy in dairy and vegetables can increase bloating.  Drink plenty of fluids but you should avoid alcoholic beverages for  24 hours.  ACTIVITY:  You should plan to take it easy for the rest of today and you should NOT DRIVE or use heavy machinery until tomorrow (because of the sedation medicines used during the test).    FOLLOW UP: Our staff will call the number listed on your records the next business day following your procedure to check on you and address any questions or concerns that you may have regarding the information given to you following your procedure. If we do not reach you, we will leave a message.  However, if you are feeling well and you are not experiencing any problems, there is no need to return our call.  We will assume that you have returned to your regular daily activities without incident.  If any biopsies were taken you will be contacted by phone or by letter within the next 1-3 weeks.  Please call us at [phone] if you have not heard about the biopsies in 3 weeks.    SIGNATURES/CONFIDENTIALITY: You and/or your care partner have signed paperwork which will be entered into your electronic medical record.  These signatures attest to the fact that that the information above on your After Visit Summary has been reviewed and is understood.  Full responsibility of the confidentiality of this discharge information lies with you and/or your care-partner.  Gastritis,hiatal hernia-handouts given  Avoid NSAID (ibuprofen, motrin, aleve, advil, naproxen,etc)  Increase nexium to 40 mg twice a day  for 4-8 weeks.

## 2015-04-28 NOTE — Progress Notes (Signed)
Patient awakening,vss,report to rn 

## 2015-04-28 NOTE — Progress Notes (Signed)
Called to room to assist during endoscopic procedure.  Patient ID and intended procedure confirmed with present staff. Received instructions for my participation in the procedure from the performing physician.  

## 2015-04-28 NOTE — Op Note (Signed)
[place]  [address]. [place], [address]   ENDOSCOPY PROCEDURE REPORT  PATIENT: Gina Wise, Gina Wise  MR#: [id number] BIRTHDATE: 1961-10-19 , 59  yrs. old GENDER: female ENDOSCOPIST: Jerene Bears, MD PROCEDURE DATE:  04/28/2015 PROCEDURE:  EGD, diagnostic and EGD w/ biopsy ASA CLASS:     Class II INDICATIONS:  history of GERD, history of Schatzki's ring, history of goblet cells raising question of Barrett's esophagus at endoscopy 1 year ago, dysphagia. MEDICATIONS: Monitored anesthesia care and Propofol 400 mg IV TOPICAL ANESTHETIC: none  DESCRIPTION OF PROCEDURE: After the risks benefits and alternatives of the procedure were thoroughly explained, informed consent was obtained.  The LB [id number] endoscope was introduced through the mouth and advanced to the second portion of the duodenum , Without limitations.  The instrument was slowly withdrawn as the mucosa was fully examined.    ESOPHAGUS: The mucosa of the esophagus appeared normal.   A Schatzki ring was found 39 cm from the incisors. After passage with the standard adult upper endoscope there was a small mucosal tear at the level of the ring.  Multiple biopsies were performed using cold forceps at the Schatzki's ring for ring disruption.  There was no endoscopic evidence of Barrett's mucosa today  STOMACH: Ulcerated and severe acute gastritis (inflammation) was found in the gastric antrum and at the pylorus.  There were erosions present.  Multiple biopsies were performed using cold forceps from the gastric body, antrum and incisura. Biopsies also obtained around erosions and small ulcer.   A 2 cm hiatal hernia was noted.  DUODENUM: Mild duodenal inflammation was found in the duodenal bulb. The duodenal mucosa showed no abnormalities in the 2nd part of the duodenum.  Retroflexed views revealed a hiatal hernia.     The scope was then withdrawn from the patient and the procedure  completed.  COMPLICATIONS: There were no immediate complications.  ENDOSCOPIC IMPRESSION: 1.   The mucosa of the esophagus appeared normal 2.   Schatzki ring was found 39 cm from the incisors; multiple biopsies were performed 3.   Acute gastritis (inflammation) was found in the gastric antrum and at the pylorus; multiple biopsies were performed 4.   2 cm hiatal hernia, stable 5.   Duodenal inflammation was found in the duodenal bulb 6.   The duodenal mucosa showed no abnormalities in the 2nd part of the duodenum   RECOMMENDATIONS: 1.  Await biopsy results 2.  Avoid NSAIDs 3.  Increase Nexium to 40 mg twice daily for 4-8 weeks  REPEAT EXAM:  pending pathology results  eSigned:  Jerene Bears, MD 04/28/2015 10:33 AM    CC: the patient, Dossie Arbour  PATIENT NAME:  Car[place], M[place]ax MR#: [id number]

## 2015-04-29 ENCOUNTER — Telehealth: Payer: Self-pay | Admitting: *Deleted

## 2015-04-29 NOTE — Telephone Encounter (Signed)
No answer, left message to call if questions or concerns. 

## 2015-05-03 ENCOUNTER — Encounter: Payer: Self-pay | Admitting: Family Medicine

## 2015-05-03 ENCOUNTER — Other Ambulatory Visit: Payer: Self-pay | Admitting: Family Medicine

## 2015-05-03 ENCOUNTER — Ambulatory Visit (INDEPENDENT_AMBULATORY_CARE_PROVIDER_SITE_OTHER): Payer: 59 | Admitting: Family Medicine

## 2015-05-03 VITALS — BP 136/84 | HR 114 | Temp 98.2°F | Wt 185.0 lb

## 2015-05-03 DIAGNOSIS — L500 Allergic urticaria: Secondary | ICD-10-CM

## 2015-05-03 DIAGNOSIS — L298 Other pruritus: Secondary | ICD-10-CM

## 2015-05-03 MED ORDER — PERMETHRIN 5 % EX CREA: 1.0000 "application " | TOPICAL_CREAM | Freq: Once | CUTANEOUS | Status: DC

## 2015-05-03 MED ORDER — HYDROXYZINE HCL 50 MG PO TABS: 50.0000 mg | ORAL_TABLET | Freq: Four times a day (QID) | ORAL | Status: DC | PRN

## 2015-05-03 NOTE — Progress Notes (Signed)
Patient ID: Gina Wise, female   DOB: 1961-02-14, 54 y.o.   MRN: [id number]   [facility] Aquilla Hacker, MD Phone: [phone]  Subjective:   # Pruritic Rash - Pt. Is a 54 y/o F here with worsening pruritic rash.  - She was diagnosed with poison ivy in 4/16 and received 10 days of prednisone.  - Shortly after finishing the course of prednisone she developed the rash again. The rash has been located both times on her neck and back.  - She then received a depo medrol shot and 14 day course of prednisone for round 2. When she finished the steroids the rash returned again in a few days.  - This has been ongoing since April of this year.  - The rash is pruritic, raised, and not completely characteristic of contact dermatitis. There are no weeping lesions.  - Upon review of her history, she did not ever have known exposure to poison ivy, she has not been working in her yard or in contact with nature at all. She has a dog, but she has scoured her yard looking for potential [place] / oak / sumac and has not found any.  - She has also washed all of her clothes / linens / furniture at home and at the office.  - She has not made any changes to laundry detergent or soaps.  - She has not used any new body / hair products.  - No new jewelry.  - She says that benadryl has not helped with the itching.  - She denies fever, chills, nausea. She denies recent illness.  - She has never had this before, and she has never had poison ivy before.  - Rash is worst at night, but better in the morning and "builds up" throughout the day.   All relevant systems were reviewed and were negative unless otherwise noted in the HPI  Past Medical History Reviewed problem list.  Medications- reviewed and updated Current Outpatient Prescriptions  Medication Sig Dispense Refill  . esomeprazole (NEXIUM) 40 MG capsule Take 1 capsule (40 mg total) by mouth 2 (two) times daily. 60 capsule 2  .  fexofenadine (ALLEGRA) 180 MG tablet Take 180 mg by mouth daily.    . hydrochlorothiazide (HYDRODIURIL) 25 MG tablet Take 1 tablet (25 mg total) by mouth daily. 90 tablet 1  . hydrOXYzine (ATARAX/VISTARIL) 50 MG tablet Take 1 tablet (50 mg total) by mouth every 6 (six) hours as needed for itching. 30 tablet 0  . levothyroxine (SYNTHROID, LEVOTHROID) 100 MCG tablet TAKE ONE TABLET BY MOUTH ONCE DAILY BEFORE BREAKFAST 90 tablet 0  . ondansetron (ZOFRAN ODT) 4 MG disintegrating tablet Take 1 tablet (4 mg total) by mouth every 8 (eight) hours as needed for nausea or vomiting. 30 tablet 0  . pantoprazole (PROTONIX) 40 MG tablet Take 1 tablet (40 mg total) by mouth daily. 90 tablet 3  . permethrin (ELIMITE) 5 % cream Apply 1 application topically once. 60 g 0  . promethazine (PHENERGAN) 12.5 MG tablet Take 1 tablet (12.5 mg total) by mouth every 8 (eight) hours as needed for nausea or vomiting. 60 tablet 0  . ranitidine (ZANTAC) 150 MG tablet Take 1 tablet at bedtime 30 tablet 6  . sertraline (ZOLOFT) 100 MG tablet Take 1 tablet (100 mg total) by mouth daily. 90 tablet 0  . SUMAtriptan (IMITREX) 100 MG tablet Take 1 tablet (100 mg total) by mouth every 2 (two) hours as needed  for migraine. 10 tablet 1  . valACYclovir (VALTREX) 1000 MG tablet Take 1 tablet (1,000 mg total) by mouth 3 (three) times daily. 21 tablet 0   No current facility-administered medications for this visit.   Facility-Administered Medications Ordered in Other Visits  Medication Dose Route Frequency Provider Last Rate Last Dose  . 0.9 %  sodium chloride infusion   Intravenous Continuous [name], MD 150 mL/hr at 02/16/14 1213     Chief complaint-noted No additions to family history Social history- patient is a non smoker  Objective: BP 136/84 mmHg  Pulse 114  Temp(Src) 98.2 F (36.8 C) (Oral)  Wt 185 lb (83.915 kg)  LMP 04/03/2012 Gen: NAD, alert, cooperative with exam HEENT: NCAT, EOMI, PERRLnml Neck: FROM,  supple CV: RRR, good S1/S2, no murmur Resp: CTABL, no wheezes, non-labored Abd: SNTND, BS present, no guarding or organomegaly Ext: No edema, warm, normal tone, moves UE/LE spontaneously Neuro: Alert and oriented, No gross deficits Skin: Diffuse, papular, mildly erythematous rash from left upper chest around to the back of the neck and down the back. Excoriations noted. No signs of infection. No weeping lesions. Punctate areas over lesions noted.   Assessment/Plan:  # Pruritic Rash - Initially thought to be poison ivy s/p prednisone taper x 2. Some improvement with prednisone, but it returns. No signs or history of infection. She has not made any recent changes in clothing / detergents to suggest contact / other forms of dermatitis. Does not get better with antihistamines. Punctate areas may potentially be an atypical presentation of scabies. Could simply be urticaria, though it isn't necessarily characteristic of this either.  - Will get punch biopsy today. - Will treat with permethrin cream  - Will continue with atarax.  - Follow up in one week with pcp.  - f/u biopsy results.  - Will send to allergy / immunology vs. Derm if this persists despite permethrin treatment.  - She wants to avoid more steroids due to weight gain.     After informed written consent was obtained, using Betadine for cleansing and 1% Lidocaine with epinephrine for anesthetic, with sterile technique a 4 mm punch biopsy was used to obtain a biopsy specimen of the lesion. Hemostasis was obtained by pressure and wound was not sutured. Antibiotic dressing is applied, and wound care instructions provided. Be alert for any signs of cutaneous infection. The specimen is labeled and sent to pathology for evaluation. The procedure was well tolerated without complications.

## 2015-05-03 NOTE — Patient Instructions (Signed)

## 2015-05-05 ENCOUNTER — Telehealth: Payer: Self-pay | Admitting: Family Medicine

## 2015-05-05 ENCOUNTER — Encounter: Payer: Self-pay | Admitting: Family Medicine

## 2015-05-05 ENCOUNTER — Encounter: Payer: Self-pay | Admitting: Internal Medicine

## 2015-05-05 NOTE — Telephone Encounter (Signed)
Would like biospy results.  She is itching and cannot take it any longer

## 2015-05-06 MED ORDER — TRIAMCINOLONE ACETONIDE 0.025 % EX OINT: 1.0000 "application " | TOPICAL_OINTMENT | Freq: Two times a day (BID) | CUTANEOUS | Status: DC

## 2015-05-06 NOTE — Telephone Encounter (Signed)
Patient informed of message from MD, she states she took nexium 6 hours after taking synthroid and the rash immediately got worse. Still convinced that there is a connection between the two. Has appointment on 7/19 with PCP to discuss also wants to discuss her recent endoscopy show lot of ulcerations and what could have caused it. FYI to PCP.

## 2015-05-06 NOTE — Telephone Encounter (Signed)
Sent results and responded to patient questions via [place].

## 2015-05-06 NOTE — Telephone Encounter (Signed)
Pt not able to log into mychart at work, wants to know if MD can call with results, Call [phone]

## 2015-05-06 NOTE — Addendum Note (Signed)
Addended by: [name] on: 05/06/2015 04:07 PM   Modules accepted: Orders

## 2015-05-06 NOTE — Telephone Encounter (Signed)
Spoke with patient, she states she just checked her [employer]. Patient states she is interested in a topical steroid as long as it will definitely not cause weight gain. Also patient states she was doing some research online and she read that taking levothyroxine along with antacids (takes nexium) can sometimes cause severe rash and itching. Wants to know what Dr. Ree Kida thinks. Also patient states she has now stated having bruising all over wants to know if this could be causing her bruising as well. Will forward to PCP.

## 2015-05-06 NOTE — Telephone Encounter (Signed)
Note to nursing staff.  Please call patient and inform her that topical steroid will be sent to pharmacy. To the best of my knowledge the combination of Levothyroxine and Nexium does not cause the symptoms that she is having (especially since she has been on these medications for a long time with no previous reaction). I am not certain of the cause of her bruising however steroids (Prednisone) can cause bruising. If the bruising worsens or the rash does not go away she should make an appointment to see me.

## 2015-05-10 ENCOUNTER — Ambulatory Visit (HOSPITAL_COMMUNITY)
Admission: RE | Admit: 2015-05-10 | Discharge: 2015-05-10 | Disposition: A | Discharge status: Home / Self Care | Payer: 59 | Source: Ambulatory Visit | Attending: Family Medicine | Admitting: Family Medicine

## 2015-05-10 ENCOUNTER — Encounter: Payer: Self-pay | Admitting: Family Medicine

## 2015-05-10 ENCOUNTER — Ambulatory Visit (INDEPENDENT_AMBULATORY_CARE_PROVIDER_SITE_OTHER): Payer: 59 | Admitting: Family Medicine

## 2015-05-10 VITALS — BP 143/87 | HR 87 | Temp 98.0°F | Ht 66.0 in | Wt 186.6 lb

## 2015-05-10 DIAGNOSIS — M25552 Pain in left hip: Secondary | ICD-10-CM | POA: Insufficient documentation

## 2015-05-10 DIAGNOSIS — L500 Allergic urticaria: Secondary | ICD-10-CM

## 2015-05-10 DIAGNOSIS — E039 Hypothyroidism, unspecified: Secondary | ICD-10-CM

## 2015-05-10 DIAGNOSIS — M2550 Pain in unspecified joint: Secondary | ICD-10-CM | POA: Insufficient documentation

## 2015-05-10 DIAGNOSIS — T148XXA Other injury of unspecified body region, initial encounter: Secondary | ICD-10-CM | POA: Insufficient documentation

## 2015-05-10 DIAGNOSIS — I10 Essential (primary) hypertension: Secondary | ICD-10-CM

## 2015-05-10 DIAGNOSIS — M25562 Pain in left knee: Secondary | ICD-10-CM

## 2015-05-10 DIAGNOSIS — T148 Other injury of unspecified body region: Secondary | ICD-10-CM | POA: Diagnosis not present

## 2015-05-10 DIAGNOSIS — M25551 Pain in right hip: Secondary | ICD-10-CM | POA: Diagnosis not present

## 2015-05-10 HISTORY — DX: Other injury of unspecified body region, initial encounter: T14.8XXA

## 2015-05-10 LAB — CBC WITH DIFFERENTIAL/PLATELET
BASOS ABS: 0 10*3/uL (ref 0.0–0.1)
Basophils Relative: 0 % (ref 0–1)
EOS PCT: 5 % (ref 0–5)
Eosinophils Absolute: 0.3 10*3/uL (ref 0.0–0.7)
HEMATOCRIT: 40.2 % (ref 36.0–46.0)
HEMOGLOBIN: 14.1 g/dL (ref 12.0–15.0)
LYMPHS PCT: 30 % (ref 12–46)
Lymphs Abs: 2 10*3/uL (ref 0.7–4.0)
MCH: 29.3 pg (ref 26.0–34.0)
MCHC: 35.1 g/dL (ref 30.0–36.0)
MCV: 83.4 fL (ref 78.0–100.0)
MPV: 9.8 fL (ref 8.6–12.4)
Monocytes Absolute: 0.7 10*3/uL (ref 0.1–1.0)
Monocytes Relative: 10 % (ref 3–12)
NEUTROS PCT: 55 % (ref 43–77)
Neutro Abs: 3.7 10*3/uL (ref 1.7–7.7)
Platelets: 302 10*3/uL (ref 150–400)
RBC: 4.82 MIL/uL (ref 3.87–5.11)
RDW: 14.1 % (ref 11.5–15.5)
WBC: 6.7 10*3/uL (ref 4.0–10.5)

## 2015-05-10 LAB — TSH: TSH: 0.205 u[IU]/mL — ABNORMAL LOW (ref 0.350–4.500)

## 2015-05-10 LAB — RHEUMATOID FACTOR

## 2015-05-10 MED ORDER — AMLODIPINE BESYLATE 10 MG PO TABS: 10.0000 mg | ORAL_TABLET | Freq: Every day | ORAL | Status: DC

## 2015-05-10 MED ORDER — TRIAMCINOLONE ACETONIDE 0.5 % EX OINT: 1.0000 "application " | TOPICAL_OINTMENT | Freq: Two times a day (BID) | CUTANEOUS | Status: DC

## 2015-05-10 NOTE — Assessment & Plan Note (Signed)
Switched HCTZ to Amlodipine (thought that HCTZ may be causing drug reaction).

## 2015-05-10 NOTE — Assessment & Plan Note (Signed)
Persistent rash of neck/back/upper extremities. Has not responded to steroid burst X2. Patient now with side effects related to steroids. Punch biopsy identified possible drug reaction vs urticaria.  - increase Triamcinolone to 0.5% ointment - referral to Dermatology - continue Allegra - Take atarax at night for itching - stop HCTZ as this may be causing a drug reaction

## 2015-05-10 NOTE — Assessment & Plan Note (Signed)
Patient has bruising of extremities after 2x courses of prednisone. Suspect symptoms are related steroids. -check CBC to monitor platelets

## 2015-05-10 NOTE — Patient Instructions (Signed)
Thank you for coming in to see [place] today!  We are going to refer you to a dermatologist for your rash and you should expect a call from the. We are going to give you a stronger steroid ointment for your  Restart your allegra should help with itching. If you need something extra at night take the Atarax for sleep. We are checking your lab work for platelets and also some clotting factors to make sure something is not going on. For your hip pain we are going to get an Xray of your hips. We are stopping your HCTZ and giving you 10mg  of amlodipine so as to remove all possible drug reactions. We are not putting you on an ace inhibitor because this blood pressure medication can also cause a rash.

## 2015-05-10 NOTE — Progress Notes (Signed)
Subjective:     Patient ID: Gina Wise, female   DOB: [birth year], 54 y.o.   MRN: [id number]  HPI  Documentation completed by Richarda Blade MS3  Gina Wise is a 54 y.o woman with a PMHx of HTN, gastroparesis, schatzki's ring, hypothyroidism and depression. She presents to the clinic with worsening itchy rash which is present on her trunk and upper extremities. Her rash began on her her neck in April and spread to her trunk and arms. She was diagnosed with poison ivy and contact dermatitis in the past and prescribed 2x 2 wk courses of prednisone which she states has helped with the rash, however it returns after completion. She has been through all possible changes she made in the past 4 months soaps/detergents/shampoos but cannot think of anything new which she has done. She denies any mold in her home and states that she keeps it clean. She is very frustrated and just wants to know why she is having this rash. She has also had bruising on her legs arms and some on her trunk which have appeared over the past week. She denies any trauma and does not know how they happen. She also states that her joints are diffusely painful and tender to palpation. The pain is constant morning and night.  She denies any changes in bowel movements, eating habits, fevers, night sweats, dysphagia, hematuria, dysuria, weight loss, chest pain, shortness of breath, joint swelling/redness, warmth. She states that the steroid cream we provided does help with the itching.  Left hip pain - bruising over the left lateral hip, no injury, present for the past few weeks, pain radiates to groin area, also reports similar symptoms in the right hip/groin.  In calling the patient's pharmacy, they stated that there have been no changes in formulations to any of the medications. They also cannot find any interactions between the patients medications listed that would cause a rash.  GERD - patient has recent EGD that showed some gastric  ulcerations, schatki's ring still present, patient relatively asymptomatic, has recently stopped taking Nexium as she thought this may be causing the rash  Social -  Never a smoker  Review of Systems  Constitutional: Positive for fatigue. Negative for fever and chills.  Respiratory: Negative for cough and shortness of breath.   Cardiovascular: Negative for chest pain and leg swelling.  Gastrointestinal: Negative for nausea, vomiting and diarrhea.  Musculoskeletal: Positive for arthralgias.   Pertinent ROS in HPI     Objective:   Physical Exam  General: patient is slightly distressed/uncomfortable and is itching her skin on her back and forearms.  HEENT: PERRL, Sclera slightly injected, no crusting or discharge from her eyes. Mucous membranes moist and no gross erythema or lesions. Gums and tongue normal. Neck supple no lymphadenopathy. CV: nl S1 s2, no m/r/g Pulm: CTAB, no w/c/r GI: NABS, s/nt/nd Neuro: AOx3  Skin: maculopapular rash with excoriations, some hives present as well on the patient's back. No vesicles or pustules seen. Erythema overlying areas where the rash is present. Rash is present upper extremities in the antecubital fossa, extensor surfaces of the elbow and shoulders. The rash is also present circumferentially around the neck.  There is extensive bruising on the patients left shoulder/arm, and left hip. No rash is seen on the patients hands or feet. There is a single cafe au liat spot on the back of the patients shoulder.  Extremities: Patients finger, shoulder, knee, ankle and hip joints are tender to palpation and movement,  non erythematous and not swollen. Normal range of motion. Normal tone and strength. MSK: groin/hip pain with FABER/FADIR bilaterally  Punch biopsy - recent punch identified possible drug reaction vs urticaria    Assessment:     54 y.o. Woman with a PMHx of HTN, gastroparesis, schatzki's ring, hypothyroidism and depression. She presents with a  worsening itchy maculopapular rash after 2x 2 wk treatments of prednisone as well as triamcinolone cream present on her trunk and upper extremities over the past 5 months. After extensive searching and the patient denies any new contacts with subsances which could cause contact dermatitis or allergic reactions.    Plan:     RASH: The etiology is undetermined as of yet. No clear source or drug reaction. The rash could have some rheumatologic etiology given the patient's joint pain and chills. The patient may have developed a sulfa drug allergy. Skin biopsy showed: There is a superficial and deep perivascular lymphocytic infiltrate in the dermis. An occasional eosinophil is seen, epidermis is unremarkable. DDx: Drug eruption, old arthropod bite, uticaria.   - Referred the patient to dermatology as we have exceeded our work up capabilities.  - Check RF, ANA, CCP for joint pain and rash. - Stop HCTZ just in case rash is due to a developed sulfa allergy, started amlodipine instead. - Triamcinolone ointment 0.5% - Advised pt to restart allegra and only take atarax in the evening to help with sleep.  - Check TSH given pts history and as hypothyroidism can cause a rash and joint pain.  BRUISING: Most probably due to the prednisone courses but could be a clotting issue.   - Start with CBC to check platelets.  - Follow up if bruising continues to worsen or if patient begins to bleed heavily from vagina or abnormally from her gums, excoriations, etc.   JOINT PAIN: may be related to the rash, evaluate with RF, ANA, TSH and CCP. Xray to look for any joint degeneration/inflammation in the hip as they are painful to [employer].   Kara Dies Pisanie MS3     I personally saw and evaluated the patient. I have reviewed the medical student note and agree with the documentation. I personally completed the physical exam. Additions to the medical student note are made in blue.   Dossie Arbour MD

## 2015-05-10 NOTE — Assessment & Plan Note (Signed)
Pain in mulitple joints (hands, wrists, hips, ankles, knees). Pain worst in the left hip.  -xray of bilateral hip  -check ANA/CCP/RF factor as rheumatolgic disorder is in the differential (in setting of diffuse rash)

## 2015-05-11 LAB — ANTI-NUCLEAR AB-TITER (ANA TITER): ANA Titer 1: NEGATIVE

## 2015-05-11 LAB — ANA: ANA: POSITIVE — AB

## 2015-05-11 LAB — CYCLIC CITRUL PEPTIDE ANTIBODY, IGG: Cyclic Citrullin Peptide Ab: <2 U/mL (ref 0.0–5.0)

## 2015-05-11 NOTE — Telephone Encounter (Signed)
I have been calling for the past 3 days to try to inform her of the biopsy results. Finally got in touch with her this am, but she has already seen Dr. Ree Kida and discussed this with him. She continues to have symptoms from the rash, and at this point we are working on process of elimination to try to figure out the offending agent. She says a referral has already been placed to dermatology. Will see her back as needed.   CGM MD

## 2015-05-12 ENCOUNTER — Telehealth: Payer: Self-pay | Admitting: Family Medicine

## 2015-05-12 NOTE — Telephone Encounter (Signed)
Would like test results from this weeks visit

## 2015-05-12 NOTE — Telephone Encounter (Signed)
Discussed lab results (see previous telephone note from earlier today). Patient has appointment at [facility] on 8/4. At her request I contacted the dermatology office and got her an earlier appointment on 7/25 at 3:30 PM.

## 2015-05-12 NOTE — Telephone Encounter (Signed)
Discussed negative hip xray/cbc/ANA/RF/CCP. TSH mildly low however has had this also in the past and have jumped between 100 mcg and 88 mcg. Decided to keep at current dose.

## 2015-05-12 NOTE — Telephone Encounter (Signed)
Attempted to call patient with results. No answer.

## 2015-05-20 ENCOUNTER — Ambulatory Visit (INDEPENDENT_AMBULATORY_CARE_PROVIDER_SITE_OTHER): Payer: 59 | Admitting: Family Medicine

## 2015-05-20 ENCOUNTER — Encounter: Payer: Self-pay | Admitting: Family Medicine

## 2015-05-20 VITALS — BP 139/83 | HR 88 | Temp 98.2°F | Ht 66.0 in | Wt 189.3 lb

## 2015-05-20 DIAGNOSIS — R21 Rash and other nonspecific skin eruption: Secondary | ICD-10-CM | POA: Diagnosis not present

## 2015-05-20 NOTE — Assessment & Plan Note (Signed)
Patient with extensive workup and uncertain diagnosis Patient being seen by dermatology and followed by PCP Consider rheumatology referral Encourage patient to restart Allegra to see if this will help at all Explained that there is nothing else to be added today in same-day clinic that will change the status of her rash

## 2015-05-20 NOTE — Patient Instructions (Signed)
Nice to meet you.  I will talk with Dr. Ree Kida and consider a rheumatology referral.  Continue to follow up with Dermatology.  Take care, Dr. Jacinto Reap

## 2015-05-20 NOTE — Progress Notes (Signed)
   Subjective:   Gina Wise is a 54 y.o. female with a history of rash of unknown etiology here for rash.  She presents to the clinic with stable itchy rash which is present on her trunk, neck, and upper extremities. Her rash began on her her neck in April and spread to her trunk and arms. She was diagnosed with poison ivy and contact dermatitis in the past and prescribed 2x 2 wk courses of prednisone which she states has helped with the rash, however it returns after completion. She has also been diagnosed with scabies, shingles.  She has been through all possible changes she made in the past 4 months soaps/detergents/shampoos but cannot think of anything new which she has done.    She is very frustrated and just wants to know why she is having this rash.  She also states that her joints are diffusely painful and tender to palpation. She states that the steroid cream we provided does help with the itching.  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  Review of Symptoms - see HPI PMH - Smoking status noted.     Objective:  BP 139/83 mmHg  Pulse 88  Temp(Src) 98.2 F (36.8 C) (Oral)  Ht 5\' 6"  (1.676 m)  Wt 189 lb 5 oz (85.872 kg)  BMI 30.57 kg/m2  LMP 04/03/2012  Gen:  54 y.o. female in NAD HEENT: NCAT, MMM, EOMI, PERRL, anicteric sclerae CV: RRR, no MRG Resp: Non-labored, CTAB, no wheezes noted Skin: maculopapular rash with excoriations, some hives present as well on the patient's back. No vesicles or pustules seen. Erythema overlying areas where the rash is present. Neuro: Alert and oriented, speech normal     Assessment:     Gina Wise is a 54 y.o. female here for rash follow-up    Plan:     See problem list for problem-specific plans.   Virginia Crews, MD PGY-2,  [facility] 05/20/2015  3:43 PM

## 2015-05-23 ENCOUNTER — Telehealth: Payer: Self-pay | Admitting: Family Medicine

## 2015-05-23 NOTE — Telephone Encounter (Signed)
Spoke with patient. Seen by dermatology last week. 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 She is to start Dapsone but was told to check with PCP first. 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 Will start Dapsone.   RN staff - please call to schedule follow up in office early next week, please also contact [facility] to attempt and get records. Thanks.

## 2015-05-23 NOTE — Telephone Encounter (Signed)
Patient scheduled for 8/8 with PCP, called [facility] they will fax over records.

## 2015-05-25 ENCOUNTER — Other Ambulatory Visit: Payer: Self-pay | Admitting: Family Medicine

## 2015-05-30 ENCOUNTER — Encounter: Payer: Self-pay | Admitting: Family Medicine

## 2015-05-30 ENCOUNTER — Ambulatory Visit (INDEPENDENT_AMBULATORY_CARE_PROVIDER_SITE_OTHER): Payer: 59 | Admitting: Family Medicine

## 2015-05-30 VITALS — BP 132/82 | HR 90 | Temp 98.6°F | Ht 66.0 in | Wt 186.0 lb

## 2015-05-30 DIAGNOSIS — T148XXA Other injury of unspecified body region, initial encounter: Secondary | ICD-10-CM

## 2015-05-30 DIAGNOSIS — T148 Other injury of unspecified body region: Secondary | ICD-10-CM | POA: Diagnosis not present

## 2015-05-30 DIAGNOSIS — L500 Allergic urticaria: Secondary | ICD-10-CM

## 2015-05-30 DIAGNOSIS — I10 Essential (primary) hypertension: Secondary | ICD-10-CM

## 2015-05-30 DIAGNOSIS — M25562 Pain in left knee: Secondary | ICD-10-CM

## 2015-05-30 DIAGNOSIS — R21 Rash and other nonspecific skin eruption: Secondary | ICD-10-CM

## 2015-05-30 LAB — CBC
HCT: 44.9 % (ref 36.0–46.0)
HEMOGLOBIN: 15.7 g/dL — AB (ref 12.0–15.0)
MCH: 29.4 pg (ref 26.0–34.0)
MCHC: 35 g/dL (ref 30.0–36.0)
MCV: 84.1 fL (ref 78.0–100.0)
MPV: 10 fL (ref 8.6–12.4)
PLATELETS: 352 10*3/uL (ref 150–400)
RBC: 5.34 MIL/uL — ABNORMAL HIGH (ref 3.87–5.11)
RDW: 13.9 % (ref 11.5–15.5)
WBC: 8 10*3/uL (ref 4.0–10.5)

## 2015-05-30 LAB — POCT SEDIMENTATION RATE: POCT SED RATE: 1 mm/hr (ref 0–22)

## 2015-05-30 NOTE — Progress Notes (Signed)
Patient is due for a screening mammogram.  Information on imaging center given to patient to call and schedule mammogram appointment.   

## 2015-05-30 NOTE — Assessment & Plan Note (Signed)
Generally improved from previous visit. CBC showed normal platelet number.

## 2015-05-30 NOTE — Assessment & Plan Note (Signed)
Blood pressure stable on amlodipine.

## 2015-05-30 NOTE — Patient Instructions (Addendum)
It was nice to see you today.  Joint Pain - I have referred you to a Rheumatologist (my nursing staff will call you to schedule an appointment). Your ANA and RF/CCP lab tests were negative.  Rash - you appear to be responding to the Dapsone, check CBC and CMP today.   Dr. Ree Kida will call you with your lab results.

## 2015-05-30 NOTE — Progress Notes (Signed)
   Subjective:    Patient ID: Gina Wise, female    DOB: 1961-06-08, 54 y.o.   MRN: [id number]  HPI 54 year old female presents for follow-up of rash.  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 Recent punch biopsy was consistent with drug reaction versus urticaria. Patient was started on dapsone and has noted a significant improvement of the rash. She has also had an improvement of her itching. Needs a CMP and CBC drawn per the dermatologist recommendations.  Joint pain - patient continues to have diffuse joint pain including the wrist/elbow was/shoulders/hips/and knees. She also has associated fatigue. Had recent workup including negative ANA/CCP/RF. She reports no swelling or erythema of her joints.  Bruising - patient has noted an improvement in the bruising since last visit however has noted a few new bruises in the past few days.  Social-not a smoker   Review of Systems  Constitutional: Positive for fatigue. Negative for fever and chills.  Respiratory: Negative for cough and shortness of breath.        Objective:   Physical Exam Vitals: Reviewed Gen.: Pleasant Caucasian female, no acute distress Cardiac: Regular in rhythm, S1 and S2 present, no murmurs, no heaves or thrills Respiratory: Clear to station bilaterally, normal effort Skin: Diffuse echo papular rash of upper extremities and back still present however improved from previous examination MSK: No joint erythema or swelling noted     Assessment & Plan:  Please see problem specific assessment and plan.

## 2015-05-30 NOTE — Assessment & Plan Note (Signed)
Patient presents for follow-up of rash. 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 May also be consistent with allergic dermatitis. Patient is responding to trial of dapsone. -Continue current therapy -Patient to follow-up with dermatology

## 2015-05-30 NOTE — Assessment & Plan Note (Signed)
Patient continues to have pain in multiple joints including the wrists, shoulders, elbows, hips, and ankles. Knees are spared. Recent ANA and RF/CCP negative. -Check ESR, CRP, and CK -Referral to rheumatology

## 2015-05-31 ENCOUNTER — Telehealth: Payer: Self-pay | Admitting: Family Medicine

## 2015-05-31 LAB — COMPREHENSIVE METABOLIC PANEL
ALT: 47 U/L — ABNORMAL HIGH (ref 6–29)
AST: 33 U/L (ref 10–35)
Albumin: 4.8 g/dL (ref 3.6–5.1)
Alkaline Phosphatase: 60 U/L (ref 33–130)
BUN: 14 mg/dL (ref 7–25)
CHLORIDE: 100 mmol/L (ref 98–110)
CO2: 22 mmol/L (ref 20–31)
Calcium: 10.1 mg/dL (ref 8.6–10.4)
Creat: 0.8 mg/dL (ref 0.50–1.05)
GLUCOSE: 99 mg/dL (ref 65–99)
POTASSIUM: 3.5 mmol/L (ref 3.5–5.3)
Sodium: 137 mmol/L (ref 135–146)
Total Bilirubin: 0.7 mg/dL (ref 0.2–1.2)
Total Protein: 8.1 g/dL (ref 6.1–8.1)

## 2015-05-31 LAB — CK: Total CK: 69 U/L (ref 7–177)

## 2015-05-31 LAB — C-REACTIVE PROTEIN

## 2015-05-31 NOTE — Telephone Encounter (Signed)
Called patient to discuss negative ESR/CRP/CK/CBC/BMP

## 2015-06-11 ENCOUNTER — Other Ambulatory Visit: Payer: Self-pay | Admitting: Family Medicine

## 2015-06-23 ENCOUNTER — Telehealth: Payer: Self-pay | Admitting: Family Medicine

## 2015-06-23 NOTE — Telephone Encounter (Signed)
Bones are aching like she has the flu.  She has an appt with rheumotogist in 3 weeks and she fells like she cannot  take the pain until thie. Please advise  Cell  [phone]

## 2015-07-01 ENCOUNTER — Ambulatory Visit: Payer: 59 | Admitting: Family Medicine

## 2015-07-14 ENCOUNTER — Ambulatory Visit (HOSPITAL_COMMUNITY)
Admission: RE | Admit: 2015-07-14 | Discharge: 2015-07-14 | Disposition: A | Discharge status: Home / Self Care | Payer: 59 | Source: Ambulatory Visit | Attending: Family Medicine | Admitting: Family Medicine

## 2015-07-14 ENCOUNTER — Ambulatory Visit (INDEPENDENT_AMBULATORY_CARE_PROVIDER_SITE_OTHER): Payer: 59 | Admitting: Family Medicine

## 2015-07-14 DIAGNOSIS — R0602 Shortness of breath: Secondary | ICD-10-CM | POA: Diagnosis present

## 2015-07-14 DIAGNOSIS — R0989 Other specified symptoms and signs involving the circulatory and respiratory systems: Secondary | ICD-10-CM | POA: Insufficient documentation

## 2015-07-14 DIAGNOSIS — E039 Hypothyroidism, unspecified: Secondary | ICD-10-CM | POA: Diagnosis not present

## 2015-07-14 DIAGNOSIS — M3213 Lung involvement in systemic lupus erythematosus: Secondary | ICD-10-CM | POA: Insufficient documentation

## 2015-07-14 DIAGNOSIS — I10 Essential (primary) hypertension: Secondary | ICD-10-CM

## 2015-07-14 DIAGNOSIS — R0600 Dyspnea, unspecified: Secondary | ICD-10-CM

## 2015-07-14 DIAGNOSIS — M2550 Pain in unspecified joint: Secondary | ICD-10-CM | POA: Diagnosis not present

## 2015-07-14 DIAGNOSIS — E663 Overweight: Secondary | ICD-10-CM

## 2015-07-14 MED ORDER — HYDROCHLOROTHIAZIDE 25 MG PO TABS: 25.0000 mg | ORAL_TABLET | Freq: Every day | ORAL | Status: DC

## 2015-07-14 NOTE — Progress Notes (Signed)
   Subjective:    Patient ID: Gina Wise, female    DOB: 08-27-1961, 54 y.o.   MRN: [id number]  HPI 54 y/o female presents for follow up of body/joint aches.  Joint aches - went to rheumatology last week, still having multiple joint aches, labs obtained and pending per rheumatology  HTN - had trouble affording norvasc, attempted to go back on HCTZ 25 mg daily, no chest pain  Weight gain - has been attempting Atkins with no success over the past few months, feels like her metabolism has slowed down since having recently been on steroids (currently off this medication): Diet - breakfast: adkins shake, snack: carrots; lunch: salad, dinner: chicken and vegetables; keeping track of calories (sometimes less than 1000 calories per day); not exercising regularly other than walking her dog (limited by pain and work hours)  Hypothyroid - taking synthroid as prescirbed  Social - nonsmoker   Review of Systems  Constitutional: Positive for fatigue and unexpected weight change. Negative for fever and chills.  Respiratory: Positive for shortness of breath. Negative for cough.   Cardiovascular: Negative for chest pain, palpitations and leg swelling.  Gastrointestinal: Negative for nausea, diarrhea and rectal pain.  Musculoskeletal: Positive for myalgias and arthralgias.  SOB mostly with exertion, does prop herself up to sleep at night, no leg swelling     Objective:   Physical Exam Weight: up 13 lbs per our records in the past 12 months HEENT: Normocephalic, pupils equal round and reactive to light, extraocular movements are intact, no scleral icterus, moist mucous members, neck was supple, no anterior posterior cervical lymphadenopathy Cardiac: Regular rate and rhythm, S1 and S2 present, no murmurs, no heaves or thrills Respiratory: Crackles at bilateral bases, clear to auscultation otherwise, normal effort Abdomen: Soft, nontender, bowel sounds present, no hepatosplenomegaly Extremities: No edema,  2+ radial pulses bilaterally Skin: Faint maculopapular rash of bilateral upper extremities noted       Assessment & Plan:  Please see problem specific assessment and plan.

## 2015-07-14 NOTE — Assessment & Plan Note (Signed)
Patient continues to have joint pain in multiple sites. Seen by rheumatology last week. Lab work pending. -Will send for records from rheumatology to review labwork

## 2015-07-14 NOTE — Assessment & Plan Note (Signed)
Patient reports baseline dyspnea especially with exertion. Crackles noted on lung exam today. -check CXR

## 2015-07-14 NOTE — Patient Instructions (Addendum)
It was nice to see you today.  We will recheck your thyroid today.  Continue your HCTZ, stop Norvasc.  Dr. Ree Kida will get results from rheumatology and call you with the results. We will re-discus weight loss medications at that time.   Please go to the hospital to get an xray of your chest.

## 2015-07-14 NOTE — Assessment & Plan Note (Signed)
Patient reports weight gain -Check TSH

## 2015-07-14 NOTE — Assessment & Plan Note (Signed)
BMI 30. Records indicate 13 pound weight gain over the past 12 months. Patient reports over 20 pound weight gain. Diet appears appropriate. Patient unable to exercise regularly secondary to multiple joint complaints. Patient requesting phentermine however will hold at this time as patient does not appear to have an issue with overeating. -Encouraged water-based exercises to improve joint pain  -Patient encouraged to consume release 1500 cal per day -Will send records to get recent rheumatology workup as I suspect this may be playing into her current weight gain -Patient previously on steroids which may have initially led to her weight gain, currently off steroids -check TSH as currently on synthroid

## 2015-07-14 NOTE — Assessment & Plan Note (Signed)
Controlled on HCTZ -Continue current medication

## 2015-07-15 ENCOUNTER — Telehealth: Payer: Self-pay | Admitting: Family Medicine

## 2015-07-15 DIAGNOSIS — R0600 Dyspnea, unspecified: Secondary | ICD-10-CM

## 2015-07-15 LAB — TSH: TSH: 7.092 u[IU]/mL — ABNORMAL HIGH (ref 0.350–4.500)

## 2015-07-15 MED ORDER — ALBUTEROL SULFATE HFA 108 (90 BASE) MCG/ACT IN AERS: 2.0000 | INHALATION_SPRAY | Freq: Four times a day (QID) | RESPIRATORY_TRACT | Status: DC | PRN

## 2015-07-15 MED ORDER — LEVOTHYROXINE SODIUM 112 MCG PO TABS: 112.0000 ug | ORAL_TABLET | Freq: Every day | ORAL | Status: DC

## 2015-07-15 NOTE — Telephone Encounter (Signed)
Pt called because the inhaler that was prescribed is too expensive. She would like something cheaper called in. jw

## 2015-07-15 NOTE — Telephone Encounter (Signed)
Peer to Peer completed and improved.

## 2015-07-15 NOTE — Assessment & Plan Note (Signed)
CXR negative, Patient continues to have sob and cough, no viral URI symptoms -check CT chest with contrast to rule out interstitial lung disease

## 2015-07-15 NOTE — Telephone Encounter (Signed)
Spoke with patient about negative CXR and elevated TSH.   Will increase synthroid to 112 mcg daily. Will order CT chest to evaluate for interstitial lung disease (currnently I have a high suspicion for autoimmune illness which may be causing lung pathology).  Await Rheumatology labs, if not checked consider anti-thyroglobulin and anti-TPO antibodies.   RN staff - please schedule CT chest.

## 2015-07-15 NOTE — Telephone Encounter (Signed)
Insurance requires peer to peer review. Information given to Dr. Ree Kida. Will scheduled once insurance approves.

## 2015-07-18 NOTE — Telephone Encounter (Signed)
Left message on patient voicemail informing of CT on 10/4 at 12:45pm at [facility]. (only liquids 4 hours prior to procedure). Also left message informing patient that albuterol was the cheapest option and there was nothing cheaper that could be tried.

## 2015-07-18 NOTE — Telephone Encounter (Signed)
Spoke with nursing staff (asha), she will let patient know that we do not have another option at this time when she calls to schedule CT

## 2015-07-21 ENCOUNTER — Telehealth: Payer: Self-pay | Admitting: Family Medicine

## 2015-07-21 NOTE — Telephone Encounter (Signed)
Left message on patient voicemail that I would relay message to MD but MD will be out of the office until 10/5 so it will be after then that she hears back.

## 2015-07-21 NOTE — Telephone Encounter (Signed)
Pt called and would like to speak to Dr. Ree Kida about her up coming X-rays. She said that her rheumatologist might have the answer and she is unsure if she should still have the x-rays. She also wanted to speak to Dr, Ree Kida about her thyroid because it is so enlarged. jw

## 2015-07-25 NOTE — Telephone Encounter (Signed)
Spoke with patient. 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 Started on Plaquenil. Encouraged her to get CT scan as still having some SOB.

## 2015-07-26 ENCOUNTER — Ambulatory Visit (HOSPITAL_COMMUNITY)
Admission: RE | Admit: 2015-07-26 | Discharge: 2015-07-26 | Disposition: A | Discharge status: Home / Self Care | Payer: 59 | Source: Ambulatory Visit | Attending: Family Medicine | Admitting: Family Medicine

## 2015-07-26 ENCOUNTER — Telehealth: Payer: Self-pay | Admitting: *Deleted

## 2015-07-26 ENCOUNTER — Encounter (HOSPITAL_COMMUNITY): Payer: Self-pay

## 2015-07-26 DIAGNOSIS — R0602 Shortness of breath: Secondary | ICD-10-CM | POA: Insufficient documentation

## 2015-07-26 DIAGNOSIS — I2510 Atherosclerotic heart disease of native coronary artery without angina pectoris: Secondary | ICD-10-CM | POA: Insufficient documentation

## 2015-07-26 DIAGNOSIS — K449 Diaphragmatic hernia without obstruction or gangrene: Secondary | ICD-10-CM | POA: Diagnosis not present

## 2015-07-26 DIAGNOSIS — E039 Hypothyroidism, unspecified: Secondary | ICD-10-CM | POA: Diagnosis not present

## 2015-07-26 DIAGNOSIS — R05 Cough: Secondary | ICD-10-CM | POA: Diagnosis not present

## 2015-07-26 DIAGNOSIS — I700 Atherosclerosis of aorta: Secondary | ICD-10-CM | POA: Diagnosis not present

## 2015-07-26 DIAGNOSIS — R0600 Dyspnea, unspecified: Secondary | ICD-10-CM

## 2015-07-26 NOTE — Telephone Encounter (Signed)
Received call from radiology department. Patient scheduled today for CT chest with and without contrast but patient has a contrast allergy. Per radiologist if MD is concerned for interstitial fibrosis they can just do a High resolution chest CT without contrast. Precepted with Dr. Lindell Noe and order changed. Also changed CPT code with insurance company. FYI to PCP.

## 2015-07-29 ENCOUNTER — Telehealth: Payer: Self-pay | Admitting: Family Medicine

## 2015-07-29 NOTE — Telephone Encounter (Signed)
Attempted to return call, no answer.

## 2015-07-29 NOTE — Telephone Encounter (Signed)
Attempted to return call to discuss CT results, no answer, left message that I would call again on Monday.

## 2015-07-29 NOTE — Telephone Encounter (Signed)
Pt called because she had some x-rays done earlier in the week and wanted to know if Dr. Ree Kida has the results and if so can he call her. jw

## 2015-08-01 NOTE — Telephone Encounter (Signed)
Discussed CT results with patient, no clear cause of cough/sob, no clear interstitial lung disease, nonspecific atelectasis/fluid at bases. Patient not able to afford albuterol MDI. I am unsure if this will help her symptoms.  RN staff - please call patient to set up nurse visit for albuterol nebulizer in office, if symptoms improve will set up with nebulizer and albuterol solution

## 2015-08-02 NOTE — Telephone Encounter (Signed)
Left message on voicemail for patient to call office to schedule a nurse visit for nebulizer treatment.

## 2015-08-03 ENCOUNTER — Observation Stay (HOSPITAL_COMMUNITY): Payer: 59

## 2015-08-03 ENCOUNTER — Observation Stay (HOSPITAL_COMMUNITY)
Admission: AD | Admit: 2015-08-03 | Discharge: 2015-08-06 | Disposition: A | Discharge status: Home / Self Care | Payer: 59 | Source: Ambulatory Visit | Attending: Family Medicine | Admitting: Family Medicine

## 2015-08-03 ENCOUNTER — Ambulatory Visit (INDEPENDENT_AMBULATORY_CARE_PROVIDER_SITE_OTHER): Payer: 59 | Admitting: *Deleted

## 2015-08-03 VITALS — BP 140/89 | HR 93 | Temp 98.2°F | Resp 22

## 2015-08-03 DIAGNOSIS — E785 Hyperlipidemia, unspecified: Secondary | ICD-10-CM | POA: Diagnosis not present

## 2015-08-03 DIAGNOSIS — R0600 Dyspnea, unspecified: Principal | ICD-10-CM | POA: Insufficient documentation

## 2015-08-03 DIAGNOSIS — J189 Pneumonia, unspecified organism: Secondary | ICD-10-CM | POA: Diagnosis present

## 2015-08-03 DIAGNOSIS — G43909 Migraine, unspecified, not intractable, without status migrainosus: Secondary | ICD-10-CM | POA: Insufficient documentation

## 2015-08-03 DIAGNOSIS — R05 Cough: Secondary | ICD-10-CM | POA: Diagnosis not present

## 2015-08-03 DIAGNOSIS — I10 Essential (primary) hypertension: Secondary | ICD-10-CM | POA: Insufficient documentation

## 2015-08-03 DIAGNOSIS — F329 Major depressive disorder, single episode, unspecified: Secondary | ICD-10-CM | POA: Insufficient documentation

## 2015-08-03 DIAGNOSIS — Z79899 Other long term (current) drug therapy: Secondary | ICD-10-CM | POA: Insufficient documentation

## 2015-08-03 DIAGNOSIS — K219 Gastro-esophageal reflux disease without esophagitis: Secondary | ICD-10-CM | POA: Diagnosis not present

## 2015-08-03 DIAGNOSIS — E039 Hypothyroidism, unspecified: Secondary | ICD-10-CM | POA: Insufficient documentation

## 2015-08-03 DIAGNOSIS — R0602 Shortness of breath: Secondary | ICD-10-CM | POA: Diagnosis not present

## 2015-08-03 DIAGNOSIS — M329 Systemic lupus erythematosus, unspecified: Secondary | ICD-10-CM | POA: Insufficient documentation

## 2015-08-03 DIAGNOSIS — M3500 Sicca syndrome, unspecified: Secondary | ICD-10-CM | POA: Diagnosis not present

## 2015-08-03 DIAGNOSIS — E038 Other specified hypothyroidism: Secondary | ICD-10-CM | POA: Insufficient documentation

## 2015-08-03 HISTORY — DX: Pneumonia, unspecified organism: J18.9

## 2015-08-03 LAB — COMPREHENSIVE METABOLIC PANEL
ALBUMIN: 4.1 g/dL (ref 3.5–5.0)
ALT: 16 U/L (ref 14–54)
AST: 24 U/L (ref 15–41)
Alkaline Phosphatase: 66 U/L (ref 38–126)
Anion gap: 11 (ref 5–15)
BUN: 7 mg/dL (ref 6–20)
CHLORIDE: 100 mmol/L — AB (ref 101–111)
CO2: 26 mmol/L (ref 22–32)
CREATININE: 0.78 mg/dL (ref 0.44–1.00)
Calcium: 9.6 mg/dL (ref 8.9–10.3)
GFR calc Af Amer: >60 mL/min (ref >60)
GFR calc non Af Amer: >60 mL/min (ref >60)
Glucose, Bld: 107 mg/dL — ABNORMAL HIGH (ref 65–99)
Potassium: 3.1 mmol/L — ABNORMAL LOW (ref 3.5–5.1)
SODIUM: 137 mmol/L (ref 135–145)
Total Bilirubin: 0.6 mg/dL (ref 0.3–1.2)
Total Protein: 7.5 g/dL (ref 6.5–8.1)

## 2015-08-03 LAB — CBC WITH DIFFERENTIAL/PLATELET
Basophils Absolute: 0 10*3/uL (ref 0.0–0.1)
Basophils Relative: 0 %
EOS PCT: 2 %
Eosinophils Absolute: 0.2 10*3/uL (ref 0.0–0.7)
HEMATOCRIT: 41.2 % (ref 36.0–46.0)
Hemoglobin: 14.1 g/dL (ref 12.0–15.0)
LYMPHS ABS: 2.7 10*3/uL (ref 0.7–4.0)
LYMPHS PCT: 27 %
MCH: 28.9 pg (ref 26.0–34.0)
MCHC: 34.2 g/dL (ref 30.0–36.0)
MCV: 84.4 fL (ref 78.0–100.0)
MONO ABS: 0.9 10*3/uL (ref 0.1–1.0)
Monocytes Relative: 9 %
NEUTROS ABS: 6.1 10*3/uL (ref 1.7–7.7)
Neutrophils Relative %: 62 %
Platelets: 279 10*3/uL (ref 150–400)
RBC: 4.88 MIL/uL (ref 3.87–5.11)
RDW: 13.2 % (ref 11.5–15.5)
WBC: 9.9 10*3/uL (ref 4.0–10.5)

## 2015-08-03 LAB — BRAIN NATRIURETIC PEPTIDE: B Natriuretic Peptide: 4.8 pg/mL (ref 0.0–100.0)

## 2015-08-03 MED ORDER — LORATADINE 10 MG PO TABS: 10.0000 mg | ORAL_TABLET | Freq: Every day | ORAL | Status: DC

## 2015-08-03 MED ORDER — DEXTROSE 5 % IV SOLN
1.0000 g | INTRAVENOUS | Status: DC
  Administered 2015-08-03 – 2015-08-05 (×3): 1 g via INTRAVENOUS
  Filled 2015-08-03 (×4): qty 10

## 2015-08-03 MED ORDER — ALBUTEROL SULFATE (2.5 MG/3ML) 0.083% IN NEBU: 3.0000 mL | INHALATION_SOLUTION | Freq: Four times a day (QID) | RESPIRATORY_TRACT | Status: DC | PRN

## 2015-08-03 MED ORDER — HYDROXYCHLOROQUINE SULFATE 200 MG PO TABS
200.0000 mg | ORAL_TABLET | Freq: Every day | ORAL | Status: DC
  Administered 2015-08-03 – 2015-08-06 (×4): 200 mg via ORAL
  Filled 2015-08-03 (×5): qty 1

## 2015-08-03 MED ORDER — HYDROCHLOROTHIAZIDE 25 MG PO TABS
25.0000 mg | ORAL_TABLET | Freq: Every day | ORAL | Status: DC
  Administered 2015-08-04 – 2015-08-06 (×3): 25 mg via ORAL
  Filled 2015-08-03 (×3): qty 1

## 2015-08-03 MED ORDER — ENOXAPARIN SODIUM 40 MG/0.4ML ~~LOC~~ SOLN
40.0000 mg | SUBCUTANEOUS | Status: DC
  Administered 2015-08-03 – 2015-08-05 (×3): 40 mg via SUBCUTANEOUS
  Filled 2015-08-03 (×3): qty 0.4

## 2015-08-03 MED ORDER — DEXTROSE 5 % IV SOLN
500.0000 mg | INTRAVENOUS | Status: DC
  Administered 2015-08-03: 500 mg via INTRAVENOUS
  Filled 2015-08-03 (×2): qty 500

## 2015-08-03 MED ORDER — SODIUM CHLORIDE 0.9 % IV SOLN
INTRAVENOUS | Status: DC
  Administered 2015-08-03 – 2015-08-04 (×4): via INTRAVENOUS
  Administered 2015-08-05: 125 mL/h via INTRAVENOUS
  Administered 2015-08-05 – 2015-08-06 (×2): via INTRAVENOUS

## 2015-08-03 MED ORDER — ACETAMINOPHEN 325 MG PO TABS
650.0000 mg | ORAL_TABLET | Freq: Four times a day (QID) | ORAL | Status: DC | PRN
  Administered 2015-08-04 (×2): 650 mg via ORAL
  Filled 2015-08-03 (×2): qty 2

## 2015-08-03 MED ORDER — TRAZODONE HCL 50 MG PO TABS
25.0000 mg | ORAL_TABLET | Freq: Every evening | ORAL | Status: DC | PRN
Start: 2015-08-03 — End: 2015-08-06

## 2015-08-03 MED ORDER — SUMATRIPTAN SUCCINATE 100 MG PO TABS
100.0000 mg | ORAL_TABLET | ORAL | Status: DC | PRN
  Administered 2015-08-04 – 2015-08-05 (×4): 100 mg via ORAL
  Filled 2015-08-03 (×8): qty 1

## 2015-08-03 MED ORDER — ACETAMINOPHEN 650 MG RE SUPP: 650.0000 mg | Freq: Four times a day (QID) | RECTAL | Status: DC | PRN

## 2015-08-03 MED ORDER — SERTRALINE HCL 100 MG PO TABS
100.0000 mg | ORAL_TABLET | Freq: Every day | ORAL | Status: DC
  Administered 2015-08-04 – 2015-08-06 (×3): 100 mg via ORAL
  Filled 2015-08-03 (×3): qty 1

## 2015-08-03 MED ORDER — ADULT MULTIVITAMIN W/MINERALS CH
1.0000 | ORAL_TABLET | Freq: Every day | ORAL | Status: DC
  Administered 2015-08-03 – 2015-08-06 (×4): 1 via ORAL
  Filled 2015-08-03 (×4): qty 1

## 2015-08-03 MED ORDER — LEVOTHYROXINE SODIUM 112 MCG PO TABS
112.0000 ug | ORAL_TABLET | Freq: Every day | ORAL | Status: DC
  Administered 2015-08-04 – 2015-08-06 (×3): 112 ug via ORAL
  Filled 2015-08-03 (×3): qty 1

## 2015-08-03 MED ORDER — PANTOPRAZOLE SODIUM 40 MG PO TBEC
40.0000 mg | DELAYED_RELEASE_TABLET | Freq: Two times a day (BID) | ORAL | Status: DC
  Administered 2015-08-03 – 2015-08-06 (×6): 40 mg via ORAL
  Filled 2015-08-03 (×6): qty 1

## 2015-08-03 MED ORDER — BENZONATATE 100 MG PO CAPS
100.0000 mg | ORAL_CAPSULE | Freq: Three times a day (TID) | ORAL | Status: DC
  Administered 2015-08-03 – 2015-08-06 (×8): 100 mg via ORAL
  Filled 2015-08-03 (×8): qty 1

## 2015-08-03 NOTE — H&P (Signed)
[facility] Admission History and Physical Service Pager: [phone]  Patient name: Gina Wise Medical record number: [id number] Date of birth: [birth year] Age: 54 y.o. Gender: female  Primary Care Provider: [name], MD Consultants: Pulmonology Code Status: Full Code   Chief Complaint: Exertional Dyspnea x 1-2 weeks.   Assessment and Plan: Gina Wise is a 54 y.o. female presenting with Dyspnea and Cough for 2-3 weeks. PMH is significant for Recent Diagnosis of Lupus and Sjogren's Syndrome, Hypothyroidism, HLD, HTN, Depression, Migraine Headache, GERD.   # Dyspnea on Exertion - Pt. Sent over from clinic due to worsening dyspnea over the past two weeks over two office visits. She has a nonproductive cough with worsening dyspnea on exertion without fevers. Exam with faint crackles in the bases and mid-lung bilaterally. Recent diagnosis with Lupus and Sjogren's syndrome. CXR 9/22 unremarkable, CT Chest 10/4 remarkable for a fine reticular pattern nonspecific interstitial pneumonia. Well's 1.5. ( low risk) DDx: Viral Syndrome, CAP, Pulm HTN, Unlikely to be HF related, ? Autoimmune etiology Lupus ILD, PE less likely.  - Admit to Med-Surg Dr. Ree Kida attending - CBC with diff, CMP, BNP - O2 sats with vitals.  - Incentive spirometer  - Will start on Rocephin / Azithromycin empirically for Pneumonia - prn albuterol - Tessalon perles for cough - Echocardiogram for ? pulm HTN vs. CHF - Pulmonology consultation.  - 2 view CXR - Monitor overnight for improvement on antibiotics.   # Hypothyroidism - recent TSH > 7. On Synthroid. No palpitations at this time. Had been gaining weight.  - Continue home synthroid.   # Lupus / Sjogren's - Recent Diagnosis with weakly positive ANA, + Anti Rho, all other workup negative including ESR, CRP, CK. Initial Lupus Ab was negative. Followed by Rheumatology - Continue home plaquenil for now. 271m qhs.   # Seasonal Allergies -  has these throughout most of the year. Had these prior to developing cough /SOB.  - Claritin daily.   # HTN - Normotensive here - HCTZ 279mdaily.   # Depression  - continue zoloft 5051md.   # Migraine Headache  - Tylenol prn.  - Home imitrex prn.   # GERD  - continue ppi here.    FEN/GI: MIVF overnight, Regular diet.  Prophylaxis: Lovenox.   Disposition: Pending improvement.   History of Present Illness:  Gina Wise a 54 27o. female presenting with dyspnea on exertion with nonproductive cough for the past 2-3 weeks. She was recently diagnosed with lupus and sjogren's syndrome. She says that she has also been having her normal seasonal allergies which consist of congestion without post nasal drip / sore throat. She says that she has been increasingly short of breath over the past two weeks especially with exertion. She has no orthopnea, no lower extremity swelling. She has pain in her chest with coughing or with deep breathing that is central. She has not had fever, chills, or rigors. No dizziness, chest pain, palpitations, back pain, nausea, or diaphoresis. She denies recent travel, and she has had not had any sick contacts that she is aware of, though she works at a [employer]wise she does not smoke, does not drink ETOH. She has not had any abnormal weight loss, and rather weight gain. She has not had any night sweats.   Review Of Systems: Per HPI with the following additions: none Otherwise 12 point review of systems was performed and was unremarkable.  Patient Active Problem  List   Diagnosis Date Noted  . Dyspnea 07/14/2015  . Rash and nonspecific skin eruption 05/20/2015  . Bruising 05/10/2015  . Pain in joint, multiple sites 05/10/2015  . Shingles 02/16/2015  . 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 Allergic conjunctivitis 12/13/2014  . Throat clearing 08/10/2014  . Hyperlipidemia 08/10/2014  . Medication side effect 03/30/2014  .  Gastroparesis 03/11/2014  . Schatzki's ring 03/09/2014  . Unspecified constipation 02/23/2014  . GERD (gastroesophageal reflux disease) 02/23/2014  . Goiter 07/03/2013  . Cervical lymphadenopathy 06/15/2013  . Depression with anxiety 06/15/2013  . Headache(784.0) 10/24/2012  . Iron deficiency 03/13/2012  . Fatigue 03/12/2012  . Dysfunctional uterine bleeding 01/02/2012  . Insomnia 10/03/2011  . Dyslipidemia 09/03/2011  . Neck pain, chronic 09/03/2011  . Overweight 01/03/2011  . 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 Migraine 10/04/2009  . Hypothyroidism 09/09/2009  . Essential hypertension, benign 09/09/2009   Past Medical History: Past Medical History  Diagnosis Date  . Hypothyroid   . Overweight(278.02)   . Hypertension   . Peri-menopause   . Anemia   . High cholesterol   . Chronic bronchitis ([place])     "get it q yr; in the spring time" (02/17/2014)  . Multiple environmental allergies   . Migraines     "maybe a couple/yr" (02/17/2014)  . Depression     "have taken zoloft since 1996" (02/17/2014)  . Gastritis   . Schatzki's ring   . Diverticulosis   . GERD (gastroesophageal reflux disease)   . Allergy    Past Surgical History: Past Surgical History  Procedure Laterality Date  . Shoulder open rotator cuff repair Left 1995  . Shoulder surgery Left 1997    "cartilage"  . Exploratory laparotomy  1987  . Inguinal hernia repair Right ~ 2003  . Appendectomy  1987  . Tubal ligation  1998    laparoscopic  . Vaginal hysterectomy  04/08/2012    menorrhagia  . Tonsillectomy and adenoidectomy  1976  . Foot surgery Right ~1980    "don't know what they did; had to take something out"  . Esophagogastroduodenoscopy N/A 02/28/2014    Procedure: ESOPHAGOGASTRODUODENOSCOPY (EGD);  Surgeon: Jerene Bears, MD;  Location[facility] ENDOSCOPY;  Service: Endoscopy;  Laterality: N/A;   Social History: Social History  Substance Use Topics  . Smoking status: Never Smoker    . Smokeless tobacco: Never Used  . Alcohol Use: 0.0 oz/week    0 Standard drinks or equivalent per week     Comment: 02/17/2014 "might have a few drinks a couple times/yr"   Additional social history: None.   Please also refer to relevant sections of EMR.  Family History: Family History  Problem Relation Age of Onset  . Hypertension Father   . Emphysema Father   . Vision loss Father   . Alcohol abuse Father   . Hypertension Mother   . Heart disease Mother   . Hepatitis Mother     Hep C  . Endometriosis Mother   . Lupus Sister   . Endometriosis Sister   . Breast cancer Maternal Aunt     30's  . Breast cancer Paternal Aunt     48's  . Lung cancer Paternal Uncle     smoker  . Colon cancer Maternal Grandfather   . Uterine cancer Maternal Aunt     50's  . Cervical cancer Paternal Aunt     40's  . Lung cancer Paternal Uncle   . Stomach cancer Neg  Hx    Allergies and Medications: Allergies  Allergen Reactions  . Codeine Nausea Only  . Erythromycin Nausea And Vomiting    REACTION: Nausea Severe abdominal pain  . Iohexol Hives   Current Facility-Administered Medications on File Prior to Encounter  Medication Dose Route Frequency Provider Last Rate Last Dose  . 0.9 %  sodium chloride infusion   Intravenous Continuous [name], MD 150 mL/hr at 02/16/14 1213     Current Outpatient Prescriptions on File Prior to Encounter  Medication Sig Dispense Refill  . albuterol (PROVENTIL HFA;VENTOLIN HFA) 108 (90 BASE) MCG/ACT inhaler Inhale 2 puffs into the lungs every 6 (six) hours as needed for wheezing or shortness of breath. 1 Inhaler 2  . esomeprazole (NEXIUM) 40 MG capsule Take 1 capsule (40 mg total) by mouth 2 (two) times daily. 60 capsule 2  . fexofenadine (ALLEGRA) 180 MG tablet Take 180 mg by mouth daily.    . hydrochlorothiazide (HYDRODIURIL) 25 MG tablet Take 1 tablet (25 mg total) by mouth daily. 90 tablet 1  . levothyroxine (SYNTHROID, LEVOTHROID) 112 MCG tablet  Take 1 tablet (112 mcg total) by mouth daily before breakfast. 30 tablet 2  . promethazine (PHENERGAN) 12.5 MG tablet Take 1 tablet (12.5 mg total) by mouth every 8 (eight) hours as needed for nausea or vomiting. 60 tablet 0  . sertraline (ZOLOFT) 100 MG tablet Take 1 tablet (100 mg total) by mouth daily. 90 tablet 0  . SUMAtriptan (IMITREX) 100 MG tablet Take 1 tablet (100 mg total) by mouth every 2 (two) hours as needed for migraine. 10 tablet 1  . triamcinolone ointment (KENALOG) 0.5 % Apply 1 application topically 2 (two) times daily. 30 g 0    Objective: BP 137/78 mmHg  Pulse 102  Temp(Src) 97.6 F (36.4 C) (Oral)  Resp 19  Ht '5\' 6"'  (1.676 m)  Wt 189 lb (85.73 kg)  BMI 30.52 kg/m2  SpO2 100%  LMP 04/03/2012 Exam: General: NAD, AAOx3 Eyes: EOMI, PERRLA ENTM: Nares patents, o/p clear without erythema, dentition normal.  Neck: No LAD, mild thyromegaly.  Cardiovascular: RRR, No MGR, normal S1/S2, 2+ distal pulses.  Respiratory: Fine crackles in the bases and to the mid-lung fields. Appropriate rate, unlabored.  Abdomen: S, NT, ND, +BS MSK: MAEW, nontender.  Skin: WWP, no rashes, no lesions.  Neuro: No focal deficits.  Psych: Appropriate mood / affect.   Labs and Imaging: CBC BMET  No results for input(s): WBC, HGB, HCT, PLT in the last 168 hours. No results for input(s): NA, K, CL, CO2, BUN, CREATININE, GLUCOSE, CALCIUM in the last 168 hours.   Pending lab results.   Aquilla Hacker, MD 08/03/2015, 6:57 PM PGY-2, [place] Intern pager: [phone], text pages welcome

## 2015-08-03 NOTE — Progress Notes (Signed)
Patient direct admit arrived to floor. MD notified. Awaiting response.

## 2015-08-03 NOTE — Progress Notes (Signed)
   Patient in nurse clinic for breathing treatment per verbal order by Dr. Ree Kida.  Patient has symptoms of chest pain with inhalation, coughing, tingling lips and SOB that is worse with walking.  Patient completed albuterol treatment, home nebulizer given.  Precept with Dr. Nori Riis; with consult with Dr. Ree Kida regarding next steps for the patient.  Derl Barrow, RN

## 2015-08-03 NOTE — Progress Notes (Signed)
Discussed patient with Dr. Nori Riis. Patient having increased dyspnea. Will admit for further workup.

## 2015-08-04 ENCOUNTER — Encounter (HOSPITAL_COMMUNITY): Payer: Self-pay | Admitting: Surgery

## 2015-08-04 ENCOUNTER — Observation Stay (HOSPITAL_BASED_OUTPATIENT_CLINIC_OR_DEPARTMENT_OTHER): Payer: 59

## 2015-08-04 DIAGNOSIS — R0600 Dyspnea, unspecified: Secondary | ICD-10-CM | POA: Diagnosis not present

## 2015-08-04 DIAGNOSIS — M329 Systemic lupus erythematosus, unspecified: Secondary | ICD-10-CM

## 2015-08-04 DIAGNOSIS — J189 Pneumonia, unspecified organism: Secondary | ICD-10-CM | POA: Diagnosis not present

## 2015-08-04 DIAGNOSIS — I10 Essential (primary) hypertension: Secondary | ICD-10-CM | POA: Diagnosis not present

## 2015-08-04 DIAGNOSIS — R0602 Shortness of breath: Secondary | ICD-10-CM | POA: Diagnosis not present

## 2015-08-04 DIAGNOSIS — R938 Abnormal findings on diagnostic imaging of other specified body structures: Secondary | ICD-10-CM

## 2015-08-04 DIAGNOSIS — M3500 Sicca syndrome, unspecified: Secondary | ICD-10-CM | POA: Diagnosis not present

## 2015-08-04 LAB — CBC
HCT: 38.4 % (ref 36.0–46.0)
Hemoglobin: 12.9 g/dL (ref 12.0–15.0)
MCH: 28.9 pg (ref 26.0–34.0)
MCHC: 33.6 g/dL (ref 30.0–36.0)
MCV: 85.9 fL (ref 78.0–100.0)
PLATELETS: 244 10*3/uL (ref 150–400)
RBC: 4.47 MIL/uL (ref 3.87–5.11)
RDW: 13.4 % (ref 11.5–15.5)
WBC: 6.6 10*3/uL (ref 4.0–10.5)

## 2015-08-04 LAB — BASIC METABOLIC PANEL
ANION GAP: 10 (ref 5–15)
BUN: 8 mg/dL (ref 6–20)
CALCIUM: 8.9 mg/dL (ref 8.9–10.3)
CO2: 27 mmol/L (ref 22–32)
CREATININE: 0.84 mg/dL (ref 0.44–1.00)
Chloride: 102 mmol/L (ref 101–111)
GFR calc Af Amer: >60 mL/min (ref >60)
GLUCOSE: 100 mg/dL — AB (ref 65–99)
Potassium: 3.7 mmol/L (ref 3.5–5.1)
Sodium: 139 mmol/L (ref 135–145)

## 2015-08-04 MED ORDER — AZITHROMYCIN 250 MG PO TABS
500.0000 mg | ORAL_TABLET | Freq: Every day | ORAL | Status: DC
  Administered 2015-08-04 – 2015-08-05 (×2): 500 mg via ORAL
  Filled 2015-08-04 (×2): qty 2

## 2015-08-04 MED ORDER — ONDANSETRON HCL 4 MG PO TABS
8.0000 mg | ORAL_TABLET | Freq: Three times a day (TID) | ORAL | Status: DC | PRN
  Administered 2015-08-04 – 2015-08-05 (×2): 8 mg via ORAL
  Filled 2015-08-04 (×2): qty 2

## 2015-08-04 MED ORDER — HYDROCOD POLST-CPM POLST ER 10-8 MG/5ML PO SUER
5.0000 mL | Freq: Two times a day (BID) | ORAL | Status: DC | PRN
Start: 2015-08-04 — End: 2015-08-06
  Administered 2015-08-04 – 2015-08-06 (×4): 5 mL via ORAL
  Filled 2015-08-04 (×4): qty 5

## 2015-08-04 MED ORDER — DM-GUAIFENESIN ER 30-600 MG PO TB12
1.0000 | ORAL_TABLET | Freq: Two times a day (BID) | ORAL | Status: DC
  Administered 2015-08-04: 1 via ORAL
  Filled 2015-08-04 (×2): qty 1

## 2015-08-04 NOTE — Progress Notes (Signed)
Family Medicine Teaching Service Daily Progress Note Intern Pager: [phone]  Patient name: Gina Wise Medical record number: [id number] Date of birth: June 26, 1961 Age: 54 y.o. Gender: female  Primary Care Provider: [name], MD Consultants: pulmonology Code Status: Full    Assessment and Plan: Gina Wise is a 54 y.o. female presenting with Dyspnea and Cough for 2-3 weeks. PMH is significant for Recent Diagnosis of Lupus and Sjogren's Syndrome, Hypothyroidism, HLD, HTN, Depression, Migraine Headache, GERD.   Dyspnea on Exertion - Pt. Sent over from clinic due to worsening dyspnea over the past two weeks over two office visits. She has a nonproductive cough with worsening dyspnea on exertion without fevers. Exam with faint crackles in the bases and mid-lung bilaterally. Recent diagnosis with Lupus and Sjogren's syndrome. CXR 9/22 unremarkable, CT Chest 10/4 remarkable for a fine reticular pattern nonspecific interstitial pneumonia. Well's 1.5. ( low risk) DDx: Viral Syndrome, CAP, Pulm HTN, Unlikely to be HF related, ? Autoimmune etiology Lupus ILD, PE less likely. CBC with diff, CMP, BNP all WNL. O2 has remained in high 90's. Has not had much improvement with abx.  - O2 sats with vitals.  - Incentive spirometer  - Flonase - Continue Rocephin / Azithromycin empirically for Pneumonia - prn albuterol - Tessalon perles and Tussionex for cough - Echocardiogram for ? pulm HTN vs. CHF - Pulmonology consulted, appreciate their assistance  - Check respiratory viral panel - Will need PFT as outpatient and outpatient follow up per pulmonology   Hypothyroidism - recent TSH > 7. On Synthroid. No palpitations at this time. Had been gaining weight.  - Continue home synthroid.   Lupus / Sjogren's - Recent Diagnosis with weakly positive ANA, + Anti Rho, all other workup negative including ESR, CRP, CK. Initial Lupus Ab was negative. Followed by Rheumatology - Continue home plaquenil for now.  266m qhs.   Seasonal Allergies - has these throughout most of the year. Had these prior to developing cough /SOB.  -Claritin daily.   HTN - Normotensive here - HCTZ 277mdaily.   Depression - continue zoloft 503md.   Migraine Headache  - Tylenol prn.  - Home imitrex prn.   GERD  - continue ppi here.    FEN/GI: MIVF overnight, Regular diet.  Prophylaxis: Lovenox.   Disposition: Home pending improvement in respiratory status  Subjective:  Patient did not sleep well last night and she states she has not felt much better since receiving antibiotics. She is sitting up bed in NAD, she is still coughing very frequently. Complains of diffuse joint pain, except for her knees.   Objective: Temp:  [97.5 F (36.4 C)-98.3 F (36.8 C)] 97.5 F (36.4 C) (10/13 0553) Pulse Rate:  [93-102] 95 (10/13 0553) Resp:  [18-22] 18 (10/13 0553) BP: (112-140)/(62-89) 126/74 mmHg (10/13 0553) SpO2:  [96 %-100 %] 99 % (10/13 0553) Weight:  [189 lb (85.73 kg)] 189 lb (85.73 kg) (10/12 1818) Physical Exam: General: NAD, AAOx3, pleasant Cardiovascular: RRR, No MGR, normal S1/S2, 2+ distal pulses.  Respiratory: Fine crackles in the bases and to the mid-lung fields. Appropriate rate, unlabored Abdomen: S, NT, ND, +BS Extremities: No edema  Laboratory:  Recent Labs Lab 08/03/15 2037 08/04/15 0435  WBC 9.9 6.6  HGB 14.1 12.9  HCT 41.2 38.4  PLT 279 244    Recent Labs Lab 08/03/15 2037 08/04/15 0435  NA 137 139  K 3.1* 3.7  CL 100* 102  CO2 26 27  BUN 7 8  CREATININE 0.78 0.84  CALCIUM 9.6 8.9  PROT 7.5  --   BILITOT 0.6  --   ALKPHOS 66  --   ALT 16  --   AST 24  --   GLUCOSE 107* 100*     Imaging/Diagnostic Tests: X-ray Chest Pa And Lateral  08/03/2015  CLINICAL DATA:  54 year old female with cough for 2 weeks and shortness of Breath. Initial encounter. EXAM: CHEST  2 VIEW COMPARISON:  High-resolution chest CT 07/26/2015 and earlier. FINDINGS: Stable lung  volumes. Stable increased interstitial markings in both lungs, see recent CT report. Mediastinal contours remain normal. Visualized tracheal air column is within normal limits. No pneumothorax, pulmonary edema, pleural effusion or acute pulmonary opacity. No acute osseous abnormality identified. IMPRESSION: No acute cardiopulmonary abnormality. Stable pulmonary interstitial changes, see recent high-resolution chest CT report. Electronically Signed   By: Genevie Ann M.D.   On: 08/03/2015 20:11     Carlyle Dolly, MD 08/04/2015, 9:48 AM PGY-1, [place] Intern pager: [phone], text pages welcome

## 2015-08-04 NOTE — Consult Note (Signed)
PULMONARY / CRITICAL CARE MEDICINE   Name: Gina Wise MRN: [id number] DOB: Dec 14, 1960    ADMISSION DATE:  08/03/2015 CONSULTATION DATE:  08/04/2015  REFERRING MD :  Ree Kida, Family Practice  CHIEF COMPLAINT:  Cough, dyspnea  INITIAL PRESENTATION: 54 y/o female with a known history of SLE was admitted on 10/12 with cough and dyspnea for three weeks.  PCCM consulted for evaluation for possible SLE related pulmonary disease.  STUDIES:  10/12 CXR > interstitial changes 07/26/15 High res CT > mild interstitial changes, could this be NSIP?  SIGNIFICANT EVENTS:   HISTORY OF PRESENT ILLNESS:  This is a pleasant 54 y/o female who was admitted on 10/12 with cough, dyspnea for three weeks.  She tells me that this summer she was expieriencing a rash with associated joint aches.  She saw dermatology who thought that the rash was an autoimmune process.  She was treated with prednisone x2 for the rash but it sounds like it didn't help much.  She continued to have joint aches so she saw rheumatology (Dr. Frederik Schmidt?) and was told that she has Sjogren's disease and maybe Lupus. She was started on plaquenil.  In the last three weeks the hand/wrist pain has persisted and she has developed worsening dyspnea with cough.  This has been associated with sinus congestion and boney aches and a headache.  No fever or chills, no mucus production.  PCCM was consulted to see if her respiratory problems could be related to the Lupus/Sjogren's disease.  PAST MEDICAL HISTORY :   has a past medical history of Hypothyroid; Overweight(278.02); Hypertension; Peri-menopause; Anemia; High cholesterol; Chronic bronchitis ([place]); Multiple environmental allergies; Migraines; Depression; Gastritis; Schatzki's ring; Diverticulosis; GERD (gastroesophageal reflux disease); and Allergy.  has past surgical history that includes Shoulder open rotator cuff repair (Left, 1995); Shoulder surgery (Left, 1997); Exploratory laparotomy (1987); Inguinal  hernia repair (Right, ~ 2003); Appendectomy (1987); Tubal ligation (1998); Vaginal hysterectomy (04/08/2012); Tonsillectomy and adenoidectomy (1976); Foot surgery (Right, ~1980); and Esophagogastroduodenoscopy (N/A, 02/28/2014). Prior to Admission medications   Medication Sig Start Date End Date Taking? Authorizing Provider  acetaminophen (TYLENOL) 500 MG tablet Take 500 mg by mouth every 6 (six) hours as needed for mild pain.   Yes Historical Provider, MD  albuterol (PROVENTIL HFA;VENTOLIN HFA) 108 (90 BASE) MCG/ACT inhaler Inhale 2 puffs into the lungs every 6 (six) hours as needed for wheezing or shortness of breath. 07/15/15  Yes [name], MD  aspirin 325 MG EC tablet Take 325 mg by mouth every 4 (four) hours as needed for pain.   Yes Historical Provider, MD  b complex vitamins capsule Take 1 capsule by mouth daily.   Yes Historical Provider, MD  cyclobenzaprine (FLEXERIL) 10 MG tablet Take 10 mg by mouth 3 (three) times daily as needed for muscle spasms.   Yes Historical Provider, MD  esomeprazole (NEXIUM) 40 MG capsule Take 1 capsule (40 mg total) by mouth 2 (two) times daily. 04/28/15  Yes Jerene Bears, MD  hydrochlorothiazide (HYDRODIURIL) 25 MG tablet Take 1 tablet (25 mg total) by mouth daily. 07/14/15  Yes [name], MD  hydroxychloroquine (PLAQUENIL) 200 MG tablet Take 200 mg by mouth every evening.   Yes Historical Provider, MD  levothyroxine (SYNTHROID, LEVOTHROID) 112 MCG tablet Take 1 tablet (112 mcg total) by mouth daily before breakfast. 07/15/15  Yes [name], MD  Loratadine (CLARITIN) 10 MG CAPS Take 10 mg by mouth daily.   Yes Historical Provider, MD  Polyvinyl Alcohol-Povidone (REFRESH OP) Apply  1 drop to eye daily as needed (dry eyes).   Yes Historical Provider, MD  promethazine (PHENERGAN) 12.5 MG tablet Take 1 tablet (12.5 mg total) by mouth every 8 (eight) hours as needed for nausea or vomiting. 08/16/14  Yes Leone Haven, MD  sertraline (ZOLOFT) 100 MG tablet  Take 50 mg by mouth daily.   Yes Historical Provider, MD  SUMAtriptan (IMITREX) 100 MG tablet Take 1 tablet (100 mg total) by mouth every 2 (two) hours as needed for migraine. 08/16/14  Yes Leone Haven, MD  triamcinolone ointment (KENALOG) 0.5 % Apply 1 application topically 2 (two) times daily. 05/10/15  Yes [name], MD  Vitamin D, Cholecalciferol, 400 UNITS TABS Take 400 Units by mouth daily.   Yes Historical Provider, MD   Allergies  Allergen Reactions  . Codeine Nausea Only  . Erythromycin Nausea And Vomiting    REACTION: Nausea Severe abdominal pain  . Iohexol Hives    FAMILY HISTORY:  indicated that her mother is deceased. She indicated that her father is deceased. She indicated that her sister is alive.  SOCIAL HISTORY:  reports that she has never smoked. She has never used smokeless tobacco. She reports that she drinks alcohol. She reports that she does not use illicit drugs.  REVIEW OF SYSTEMS:   Gen: Denies fever, chills, weight change, + fatigue, night sweats HEENT: Denies blurred vision, double vision, hearing loss, tinnitus, + sinus congestion, + rhinorrhea, sore throat, neck stiffness, dysphagia PULM: per HPI CV: Denies chest pain, edema, orthopnea, paroxysmal nocturnal dyspnea, palpitations GI: Denies abdominal pain, nausea, vomiting, diarrhea, hematochezia, melena, constipation, change in bowel habits GU: Denies dysuria, hematuria, polyuria, oliguria, urethral discharge Endocrine: Denies hot or cold intolerance, polyuria, polyphagia or appetite change Derm: Denies rash, dry skin, scaling or peeling skin change Heme: Denies easy bruising, bleeding, bleeding gums Neuro: Denies headache, numbness, weakness, slurred speech, loss of memory or consciousness   SUBJECTIVE:   VITAL SIGNS: Temp:  [97.5 F (36.4 C)-98.3 F (36.8 C)] 97.5 F (36.4 C) (10/13 0553) Pulse Rate:  [93-102] 95 (10/13 0553) Resp:  [18-22] 18 (10/13 0553) BP: (112-140)/(62-89) 126/74  mmHg (10/13 0553) SpO2:  [96 %-100 %] 99 % (10/13 0553) Weight:  [189 lb (85.73 kg)] 189 lb (85.73 kg) (10/12 1818) HEMODYNAMICS:   VENTILATOR SETTINGS:   INTAKE / OUTPUT:  Intake/Output Summary (Last 24 hours) at 08/04/15 0929 Last data filed at 08/04/15 0546  Gross per 24 hour  Intake   1876 ml  Output      0 ml  Net   1876 ml    PHYSICAL EXAMINATION: General:  Coughing frequently Neuro:  Awake, alert, oriented x4, maew HEENT:  NCAT, EOMi, OP clear Cardiovascular:  RRR, no mgr Lungs:  Crackles left base, no wheezing, normal respiratory effort Abdomen:  BS+, soft, nontender Musculoskeletal:  Normal bulk, tone Skin:  No rash or skin breakdown  LABS:  CBC  Recent Labs Lab 08/03/15 2037 08/04/15 0435  WBC 9.9 6.6  HGB 14.1 12.9  HCT 41.2 38.4  PLT 279 244   Coag's No results for input(s): APTT, INR in the last 168 hours. BMET  Recent Labs Lab 08/03/15 2037 08/04/15 0435  NA 137 139  K 3.1* 3.7  CL 100* 102  CO2 26 27  BUN 7 8  CREATININE 0.78 0.84  GLUCOSE 107* 100*   Electrolytes  Recent Labs Lab 08/03/15 2037 08/04/15 0435  CALCIUM 9.6 8.9   Sepsis Markers No results for input(s):  LATICACIDVEN, PROCALCITON, O2SATVEN in the last 168 hours. ABG No results for input(s): PHART, PCO2ART, PO2ART in the last 168 hours. Liver Enzymes  Recent Labs Lab 08/03/15 2037  AST 24  ALT 16  ALKPHOS 66  BILITOT 0.6  ALBUMIN 4.1   Cardiac Enzymes No results for input(s): TROPONINI, PROBNP in the last 168 hours. Glucose No results for input(s): GLUCAP in the last 168 hours.  Imaging CXR from 10/12 personally reviewed> mild interstitial changes bilaterally   ASSESSMENT / PLAN:  PULMONARY A: Acute cough with respiratory distress in the setting of an abnormal chest x-ray> ddx includes acute viral vs atypical bacterial pneumonia (mycoplasma?) vs an autoimmune process.  I favor infectious given the sinus congestion as well and her high resolution CT  chest showed only very mild changes.  However, this could represent an autoimmune process so careful monitoring and response to antibiotic therapy is important.  I would like to see the results of her rheumatology evaluation to see how she was diagnosed with sjogren's and lupus (both of which can be associated with ILD).   P:   Continue ceftriaxone and azithromycin as you are doing Add tussionex for cough Check respiratory viral panel Check ANA, complement, DS-DNA now to look for evidence of lupus activity Close monitoring in hospital for now Will need PFT as outpatient and outpatient follow up If [facility]PTS has a copy of the rheumatology assessment, please scan into the chart   Roselie Awkward, MD [place] [facility] Pager: [phone] Cell: [phone] After 3pm or if no response, call [phone]   08/04/2015, 9:29 AM

## 2015-08-04 NOTE — Progress Notes (Signed)
  Echocardiogram 2D Echocardiogram has been performed.  Gina Wise 08/04/2015, 10:51 AM

## 2015-08-05 DIAGNOSIS — R0600 Dyspnea, unspecified: Secondary | ICD-10-CM | POA: Diagnosis not present

## 2015-08-05 DIAGNOSIS — M3500 Sicca syndrome, unspecified: Secondary | ICD-10-CM

## 2015-08-05 DIAGNOSIS — I10 Essential (primary) hypertension: Secondary | ICD-10-CM | POA: Diagnosis not present

## 2015-08-05 DIAGNOSIS — R0602 Shortness of breath: Secondary | ICD-10-CM

## 2015-08-05 DIAGNOSIS — J189 Pneumonia, unspecified organism: Secondary | ICD-10-CM | POA: Diagnosis not present

## 2015-08-05 DIAGNOSIS — M329 Systemic lupus erythematosus, unspecified: Secondary | ICD-10-CM | POA: Diagnosis not present

## 2015-08-05 LAB — CBC
HCT: 40 % (ref 36.0–46.0)
HEMOGLOBIN: 13.4 g/dL (ref 12.0–15.0)
MCH: 29 pg (ref 26.0–34.0)
MCHC: 33.5 g/dL (ref 30.0–36.0)
MCV: 86.6 fL (ref 78.0–100.0)
Platelets: 240 10*3/uL (ref 150–400)
RBC: 4.62 MIL/uL (ref 3.87–5.11)
RDW: 13.3 % (ref 11.5–15.5)
WBC: 5.7 10*3/uL (ref 4.0–10.5)

## 2015-08-05 LAB — C4 COMPLEMENT: Complement C4, Body Fluid: 27 mg/dL (ref 14–44)

## 2015-08-05 LAB — C3 COMPLEMENT: C3 Complement: 147 mg/dL (ref 82–167)

## 2015-08-05 LAB — BASIC METABOLIC PANEL
Anion gap: 7 (ref 5–15)
BUN: 7 mg/dL (ref 6–20)
CHLORIDE: 106 mmol/L (ref 101–111)
CO2: 26 mmol/L (ref 22–32)
CREATININE: 0.82 mg/dL (ref 0.44–1.00)
Calcium: 8.7 mg/dL — ABNORMAL LOW (ref 8.9–10.3)
GFR calc non Af Amer: >60 mL/min (ref >60)
GLUCOSE: 90 mg/dL (ref 65–99)
Potassium: 4 mmol/L (ref 3.5–5.1)
Sodium: 139 mmol/L (ref 135–145)

## 2015-08-05 LAB — ANTI-DNA ANTIBODY, DOUBLE-STRANDED: ds DNA Ab: 1 IU/mL (ref 0–9)

## 2015-08-05 LAB — ANTINUCLEAR ANTIBODIES, IFA: ANTINUCLEAR ANTIBODIES, IFA: NEGATIVE

## 2015-08-05 MED ORDER — FLUTICASONE PROPIONATE 50 MCG/ACT NA SUSP
1.0000 | Freq: Every day | NASAL | Status: DC | PRN
  Filled 2015-08-05: qty 16

## 2015-08-05 NOTE — Progress Notes (Signed)
PULMONARY / CRITICAL CARE MEDICINE   Name: Gina Wise MRN: [id number] DOB: 25-Apr-1961    ADMISSION DATE:  08/03/2015 CONSULTATION DATE:  08/04/2015  REFERRING MD :  Ree Kida, Family Practice  CHIEF COMPLAINT:  Cough, dyspnea  INITIAL PRESENTATION: 54 y/o female with a known history of SLE was admitted on 10/12 with cough and dyspnea for three weeks.  PCCM consulted for evaluation for possible SLE related pulmonary disease.  STUDIES:  07/26/15 High res CT >> mild interstitial changes, could this be NSIP? 10/12  CXR >> interstitial changes 10/13  ECHO >> LV: nml size, mild LVH, EF 55-60%, no RWMA, trivial TV/PV regurgitation, normal PA-S   SIGNIFICANT EVENTS: 10/12  Admit with cough/dyspnea x 3 wks   SUBJECTIVE: Pt reports "feeling terrible".  Relays coughing is causing migraines & nausea.  Non-productive cough.  Chills overnight but no fever.    VITAL SIGNS: Temp:  [97.6 F (36.4 C)-98.3 F (36.8 C)] 97.6 F (36.4 C) (10/14 0538) Pulse Rate:  [85-97] 85 (10/14 0538) Resp:  [15-19] 19 (10/14 0538) BP: (127-144)/(69-77) 127/69 mmHg (10/14 0538) SpO2:  [96 %-99 %] 96 % (10/14 0538)   INTAKE / OUTPUT:  Intake/Output Summary (Last 24 hours) at 08/05/15 1112 Last data filed at 08/04/15 2143  Gross per 24 hour  Intake   3069 ml  Output      0 ml  Net   3069 ml    PHYSICAL EXAMINATION: General:  Unwell appearing female in NAD Neuro:  Awake, alert, oriented x4, maew HEENT:  NCAT, EOMi, OP clear Cardiovascular:  RRR, no mgr Lungs:  Crackles left base, no wheezing, normal respiratory effort Abdomen:  BS+, soft, nontender Musculoskeletal:  Normal bulk, tone Skin:  No rash or skin breakdown  LABS:  CBC  Recent Labs Lab 08/03/15 2037 08/04/15 0435 08/05/15 0355  WBC 9.9 6.6 5.7  HGB 14.1 12.9 13.4  HCT 41.2 38.4 40.0  PLT 279 244 240   BMET  Recent Labs Lab 08/03/15 2037 08/04/15 0435 08/05/15 0355  NA 137 139 139  K 3.1* 3.7 4.0  CL 100* 102 106  CO2 26  27 26   BUN 7 8 7   CREATININE 0.78 0.84 0.82  GLUCOSE 107* 100* 90   Electrolytes  Recent Labs Lab 08/03/15 2037 08/04/15 0435 08/05/15 0355  CALCIUM 9.6 8.9 8.7*   Liver Enzymes  Recent Labs Lab 08/03/15 2037  AST 24  ALT 16  ALKPHOS 66  BILITOT 0.6  ALBUMIN 4.1   Imaging CXR from 10/12 personally reviewed > mild interstitial changes bilaterally   ASSESSMENT / PLAN:  PULMONARY A:  Acute cough with respiratory distress in the setting of an abnormal chest x-ray > ddx includes acute viral vs atypical bacterial pneumonia (mycoplasma?) vs an autoimmune process. Favor infectious given the sinus congestion as well and her high resolution CT chest showed only very mild changes.  However, this could represent an autoimmune process so careful monitoring and response to antibiotic therapy is important. Would like to see the results of her rheumatology evaluation to see how she was diagnosed with sjogren's and lupus (both of which can be associated with ILD).  She also carries a hx of GERD, schatzki's ring and small hiatal hernia, raises the ? chronic aspiration contribution.    P:   Continue ceftriaxone and azithromycin  Tussionex for Q12 PRN cough Respiratory viral panel >> Close monitoring in hospital for now Will need PFT as outpatient and outpatient follow up If FPTS has a copy of  the rheumatology assessment, please scan into the chart Intermittent CXR Pulmonary hygiene  Autoimmune assessment for evidence of lupus activity 10/13 ANA >> Compliment >>  DS-DNA >>    Noe Gens, NP-C  Pulmonary & Critical Care Pgr: [phone] or if no answer [phone] 08/05/2015, 11:12 AM

## 2015-08-05 NOTE — Progress Notes (Signed)
Family Medicine Teaching Service Daily Progress Note Intern Pager: [phone]  Patient name: Gina Wise Medical record number: [id number] Date of birth: 09-19-1961 Age: 54 y.o. Gender: female  Primary Care Provider: [name], MD Consultants: pulmonology Code Status: Full    Assessment and Plan: Gina Wise is a 54 y.o. female presenting with Dyspnea and Cough for 2-3 weeks. PMH is significant for Recent Diagnosis of Lupus and Sjogren's Syndrome, Hypothyroidism, HLD, HTN, Depression, Migraine Headache, GERD.   Dyspnea on Exertion - Pt. Sent over from clinic due to worsening dyspnea over the past two weeks over two office visits. She has a nonproductive cough with worsening dyspnea on exertion without fevers. Exam with faint crackles in the bases bilaterally. Recent diagnosis with Lupus and Sjogren's syndrome. CXR 9/22 unremarkable, High resolution CT Chest 10/4 remarkable for a fine reticular pattern nonspecific interstitial pneumonia. Well's 1.5. ( low risk) DDx: Viral Syndrome, CAP, Pulm HTN, Unlikely to be HF related, Autoimmune etiology Lupus ILD, or a mixed picture. CBC with diff, CMP, BNP all WNL. O2 has remained in high 90's. Echocardiogram showed no signs of CHF or increased pulmonary pressure.  - O2 sats with vitals.  - Incentive spirometer  - Flonase - Continue Rocephin / Azithromycin empirically for possible pneumonia. Transition to PO Omnicef upon DC - prn albuterol - Tessalon perles and Tussionex for cough - Pulmonology consulted, appreciate their assistance  - respiratory viral panel pending - Will need PFT as outpatient and outpatient follow up per pulmonology   Hypothyroidism - recent TSH > 7. On Synthroid. No palpitations at this time. Had been gaining weight.  - Continue home synthroid.   Lupus / Sjogren's - Recent Diagnosis with weakly positive ANA, + Anti Rho, all other workup negative including ESR, CRP, CK. Initial Lupus Ab was negative. Followed by  Rheumatology - Continue home plaquenil for now. 259m qhs.   Seasonal Allergies - has these throughout most of the year. Had these prior to developing cough /SOB.  -Claritin daily.   HTN - Normotensive here - HCTZ 271mdaily.   Depression - continue zoloft 5043md.   Migraine Headache  - Tylenol prn.  - Home imitrex prn.  - Zofran PRN for nausea when HA is severe  GERD  - continue ppi here.    FEN/GI: No IVF, Regular diet.  Prophylaxis: Lovenox.   Disposition: Home pending improvement in respiratory status  Subjective:  Patient did well last night, she was able to get more rest after the cough medication was given. She states that she feels better from yesterday. Last night she did endorse some nausea secondary to a headache. She has known migraines and received Imitrex and Zofran.   Objective: Temp:  [97.5 F (36.4 C)-98.3 F (36.8 C)] 97.6 F (36.4 C) (10/14 0538) Pulse Rate:  [85-97] 85 (10/14 0538) Resp:  [15-19] 19 (10/14 0538) BP: (127-144)/(69-80) 127/69 mmHg (10/14 0538) SpO2:  [96 %-99 %] 96 % (10/14 0538) Physical Exam: General: NAD, AAOx3, pleasant Cardiovascular: RRR, No MGR, normal S1/S2, 2+ distal pulses.  Respiratory: Fine crackles in the bases and to the mid-lung fields. Appropriate rate, unlabored Abdomen: S, NT, ND, +BS Extremities: No edema  Laboratory:  Recent Labs Lab 08/03/15 2037 08/04/15 0435 08/05/15 0355  WBC 9.9 6.6 5.7  HGB 14.1 12.9 13.4  HCT 41.2 38.4 40.0  PLT 279 244 240    Recent Labs Lab 08/03/15 2037 08/04/15 0435 08/05/15 0355  NA 137 139 139  K 3.1* 3.7 4.0  CL  100* 102 106  CO2 '26 27 26  ' BUN '7 8 7  ' CREATININE 0.78 0.84 0.82  CALCIUM 9.6 8.9 8.7*  PROT 7.5  --   --   BILITOT 0.6  --   --   ALKPHOS 66  --   --   ALT 16  --   --   AST 24  --   --   GLUCOSE 107* 100* 90     Imaging/Diagnostic Tests: No results found.   Carlyle Dolly, MD 08/05/2015, 8:28 AM PGY-1, [place] Intern pager: [phone], text pages welcome

## 2015-08-06 DIAGNOSIS — J189 Pneumonia, unspecified organism: Secondary | ICD-10-CM | POA: Diagnosis not present

## 2015-08-06 DIAGNOSIS — M3500 Sicca syndrome, unspecified: Secondary | ICD-10-CM | POA: Diagnosis not present

## 2015-08-06 DIAGNOSIS — M329 Systemic lupus erythematosus, unspecified: Secondary | ICD-10-CM | POA: Diagnosis not present

## 2015-08-06 DIAGNOSIS — I10 Essential (primary) hypertension: Secondary | ICD-10-CM | POA: Diagnosis not present

## 2015-08-06 DIAGNOSIS — R0600 Dyspnea, unspecified: Secondary | ICD-10-CM | POA: Diagnosis not present

## 2015-08-06 DIAGNOSIS — R0602 Shortness of breath: Secondary | ICD-10-CM | POA: Diagnosis not present

## 2015-08-06 LAB — RESPIRATORY VIRUS PANEL
Adenovirus: NEGATIVE
Influenza A: NEGATIVE
Influenza B: NEGATIVE
METAPNEUMOVIRUS: NEGATIVE
PARAINFLUENZA 1 A: NEGATIVE
PARAINFLUENZA 2 A: NEGATIVE
PARAINFLUENZA 3 A: NEGATIVE
RHINOVIRUS: NEGATIVE
Respiratory Syncytial Virus A: NEGATIVE
Respiratory Syncytial Virus B: NEGATIVE

## 2015-08-06 MED ORDER — AZITHROMYCIN 250 MG PO TABS: 250.0000 mg | ORAL_TABLET | Freq: Every day | ORAL | Status: AC

## 2015-08-06 MED ORDER — HYDROCOD POLST-CPM POLST ER 10-8 MG/5ML PO SUER: 5.0000 mL | Freq: Two times a day (BID) | ORAL | Status: DC | PRN

## 2015-08-06 MED ORDER — CEFDINIR 300 MG PO CAPS: 300.0000 mg | ORAL_CAPSULE | Freq: Two times a day (BID) | ORAL | Status: AC

## 2015-08-06 MED ORDER — BENZONATATE 100 MG PO CAPS: 100.0000 mg | ORAL_CAPSULE | Freq: Three times a day (TID) | ORAL | Status: DC

## 2015-08-06 NOTE — Discharge Instructions (Signed)
You were admitted for shortness of breath on exertion. We gave you some medication to help you breathe better and to help your cough. We consulted pulmonology, they would like to follow up with you on an outpatient basis.  Please go to your follow up appointment with Dr. Ree Kida and make an appointment to see a pulmonologist (the information is listed above). You will continue to take Azithromycin and Omnicef antibiotics, I have sent these prescriptions to your pharmacy. I will also send over a prescription for your cough.  If your breathing becomes worse, please come back to the hospital.   It was a pleasure caring for you, take care!

## 2015-08-06 NOTE — Discharge Summary (Signed)
[facility] Discharge Summary  Patient name: Gina Wise Medical record number: [id number] Date of birth: June 12, 1961 Age: 54 y.o. Gender: female Date of Admission: 08/03/2015  Date of Discharge: 08/06/15 Admitting Physician: [name], MD  Primary Care Provider: [name], MD Consultants: Pulmonology  Indication for Hospitalization: dyspnea on exertion  Discharge Diagnoses/Problem List:  Patient Active Problem List   Diagnosis Date Noted  . CAP (community acquired pneumonia) 08/03/2015  . Shortness of breath   . Lupus ([place])   . Sjogren's disease ([place])   . Essential hypertension   . Other specified hypothyroidism   . Dyspnea 07/14/2015  . Rash and nonspecific skin eruption 05/20/2015  . Bruising 05/10/2015  . Pain in joint, multiple sites 05/10/2015  . Shingles 02/16/2015  . 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 Allergic conjunctivitis 12/13/2014  . Throat clearing 08/10/2014  . Hyperlipidemia 08/10/2014  . Medication side effect 03/30/2014  . Gastroparesis 03/11/2014  . Schatzki's ring 03/09/2014  . Unspecified constipation 02/23/2014  . GERD (gastroesophageal reflux disease) 02/23/2014  . Goiter 07/03/2013  . Cervical lymphadenopathy 06/15/2013  . Depression with anxiety 06/15/2013  . Headache(784.0) 10/24/2012  . Iron deficiency 03/13/2012  . Fatigue 03/12/2012  . Dysfunctional uterine bleeding 01/02/2012  . Insomnia 10/03/2011  . Dyslipidemia 09/03/2011  . Neck pain, chronic 09/03/2011  . Overweight 01/03/2011  . 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 Migraine 10/04/2009  . Hypothyroidism 09/09/2009  . Essential hypertension, benign 09/09/2009   Disposition: Home  Discharge Condition: Improved  Discharge Exam:  General: NAD, AAOx3, pleasant Cardiovascular: RRR, No MGR, normal S1/S2, 2+ distal pulses.  Respiratory: Fine crackles in the bases and to the mid-lung fields. Appropriate rate, unlabored. No  wheezing or rhonchi.  Abdomen: S, NT, ND, +BS Extremities: No edema  Brief Hospital Course:  Gina Wise is a 54 y.o. female presenting with dyspnea and cough for 2-3 weeks. PMH is significant for recent diagnosis of Lupus and Sjogren's Syndrome, Hypothyroidism, HLD, HTN, Depression, Migraine Headache, GERD.   Dyspnea on exertion with cough- Differential diagnosis included viral syndrome, CAP, autoimmune etiology Lupus ILD, or a mixed picture. Patient was sent over from [place] clinic due to worsening dyspnea over the past two weeks over two office visits. She had a nonproductive cough with worsening dyspnea on exertion without fevers. CBC with diff, CMP, BNP all WNL. O2 remained in high 90's on room air. Physical exam unremarkable besides faint crackles in the bases bilaterally. CXR on 9/22 was unremarkable and High resolution CT Chest 10/4 remarkable for scattered regions of peripheral reticulation with subpleural sparing in bases that was nonspecific. CXR on 10/12 showed stable pulmonary interstitial changes, see recent high-resolution chest CT report.  IV Rocephin and Azithromycin was begun to cover for possible CAP/Walking PNA. Pulmonology was consulted due concern for pulmonary manifestation of SLE. Pulmonology recommended continued IV abx, and Tussionex for cough. Echocardiogram was performed as well to rule out structural heart involvement from SLE; results were normal and showed no signs of CHF or increased pulmonary pressure. Upon discharge, patient's dyspnea and cough improved. RVP was negative. She was sent home with Omnicef (10 day course) and Azithromycin (5 day course)  All other chronic medical conditions stable throughout admission and managed with home regimens.   Issues for Follow Up:  1.  Will need PFT as outpatient and outpatient follow up per pulmonology  2.  Will need to follow up with rheumatology  Significant Procedures: none  Significant  Labs and  Imaging:   Recent Labs Lab 08/03/15 2037 08/04/15 0435 08/05/15 0355  WBC 9.9 6.6 5.7  HGB 14.1 12.9 13.4  HCT 41.2 38.4 40.0  PLT 279 244 240    Recent Labs Lab 08/03/15 2037 08/04/15 0435 08/05/15 0355  NA 137 139 139  K 3.1* 3.7 4.0  CL 100* 102 106  CO2 26 27 26   GLUCOSE 107* 100* 90  BUN 7 8 7   CREATININE 0.78 0.84 0.82  CALCIUM 9.6 8.9 8.7*  ALKPHOS 66  --   --   AST 24  --   --   ALT 16  --   --   ALBUMIN 4.1  --   --    Respiratory Syncytial Virus A Negative  Negative   Respiratory Syncytial Virus B Negative  Negative   Influenza A Negative  Negative   Influenza B Negative  Negative   Parainfluenza 1 Negative  Negative   Parainfluenza 2 Negative  Negative   Parainfluenza 3 Negative  Negative   Metapneumovirus Negative  Negative   Rhinovirus Negative  Negative   Adenovirus Negative  Negative        X-ray Chest Pa And Lateral  08/03/2015  CLINICAL DATA:  54 year old female with cough for 2 weeks and shortness of Breath. Initial encounter. EXAM: CHEST  2 VIEW COMPARISON:  High-resolution chest CT 07/26/2015 and earlier. FINDINGS: Stable lung volumes. Stable increased interstitial markings in both lungs, see recent CT report. Mediastinal contours remain normal. Visualized tracheal air column is within normal limits. No pneumothorax, pulmonary edema, pleural effusion or acute pulmonary opacity. No acute osseous abnormality identified. IMPRESSION: No acute cardiopulmonary abnormality. Stable pulmonary interstitial changes, see recent high-resolution chest CT report. Electronically Signed   By: Genevie Ann M.D.   On: 08/03/2015 20:11    Results/Tests Pending at Time of Discharge: none  Discharge Medications:    Medication List    TAKE these medications        acetaminophen 500 MG tablet  Commonly known as:  TYLENOL  Take 500 mg by mouth every 6 (six) hours as needed for mild pain.     albuterol 108 (90 BASE) MCG/ACT inhaler  Commonly known as:   PROVENTIL HFA;VENTOLIN HFA  Inhale 2 puffs into the lungs every 6 (six) hours as needed for wheezing or shortness of breath.     aspirin 325 MG EC tablet  Take 325 mg by mouth every 4 (four) hours as needed for pain.     azithromycin 250 MG tablet  Commonly known as:  ZITHROMAX  Take 1 tablet (250 mg total) by mouth daily.     b complex vitamins capsule  Take 1 capsule by mouth daily.     benzonatate 100 MG capsule  Commonly known as:  TESSALON  Take 1 capsule (100 mg total) by mouth 3 (three) times daily.     cefdinir 300 MG capsule  Commonly known as:  OMNICEF  Take 1 capsule (300 mg total) by mouth 2 (two) times daily.     chlorpheniramine-HYDROcodone 10-8 MG/5ML Suer  Commonly known as:  TUSSIONEX  Take 5 mLs by mouth every 12 (twelve) hours as needed for cough.     CLARITIN 10 MG Caps  Generic drug:  Loratadine  Take 10 mg by mouth daily.     esomeprazole 40 MG capsule  Commonly known as:  NEXIUM  Take 1 capsule (40 mg total) by mouth 2 (two) times daily.     FLEXERIL 10  MG tablet  Generic drug:  cyclobenzaprine  Take 10 mg by mouth 3 (three) times daily as needed for muscle spasms.     hydrochlorothiazide 25 MG tablet  Commonly known as:  HYDRODIURIL  Take 1 tablet (25 mg total) by mouth daily.     hydroxychloroquine 200 MG tablet  Commonly known as:  PLAQUENIL  Take 200 mg by mouth every evening.     levothyroxine 112 MCG tablet  Commonly known as:  SYNTHROID, LEVOTHROID  Take 1 tablet (112 mcg total) by mouth daily before breakfast.     promethazine 12.5 MG tablet  Commonly known as:  PHENERGAN  Take 1 tablet (12.5 mg total) by mouth every 8 (eight) hours as needed for nausea or vomiting.     REFRESH OP  Apply 1 drop to eye daily as needed (dry eyes).     sertraline 100 MG tablet  Commonly known as:  ZOLOFT  Take 50 mg by mouth daily.     SUMAtriptan 100 MG tablet  Commonly known as:  IMITREX  Take 1 tablet (100 mg total) by mouth every 2 (two)  hours as needed for migraine.     triamcinolone ointment 0.5 %  Commonly known as:  KENALOG  Apply 1 application topically 2 (two) times daily.     Vitamin D (Cholecalciferol) 400 UNITS Tabs  Take 400 Units by mouth daily.        Discharge Instructions: Please refer to Patient Instructions section of EMR for full details.  Patient was counseled important signs and symptoms that should prompt return to medical care, changes in medications, dietary instructions, activity restrictions, and follow up appointments.   Follow-Up Appointments:   Carlyle Dolly, MD 08/06/2015, 10:54 AM PGY-1, [place]

## 2015-08-06 NOTE — Progress Notes (Signed)
PULMONARY / CRITICAL CARE MEDICINE   Name: Gina Wise MRN: [id number] DOB: 02/05/1961    ADMISSION DATE:  08/03/2015 CONSULTATION DATE:  08/04/2015  REFERRING MD :  Ree Kida, Family Practice  CHIEF COMPLAINT:  Cough, dyspnea  INITIAL PRESENTATION: 54 y/o female with a known history of SLE was admitted on 10/12 with dry cough on inspiration  and dyspnea for three weeks.  PCCM consulted for evaluation for possible SLE related pulmonary disease.  STUDIES:  07/26/15 High res CT >> mild interstitial changes, could this be NSIP? 10/12  CXR >> interstitial changes 10/13  ECHO >> LV: nml size, mild LVH, EF 55-60%, no RWMA, trivial TV/PV regurgitation, normal PA-S     SUBJECTIVE: Pt reports "feeling much better, wants to go home      VITAL SIGNS: Temp:  [97.6 F (36.4 C)-98.3 F (36.8 C)] 97.6 F (36.4 C) (10/14 0538) Pulse Rate:  [85-97] 85 (10/14 0538) Resp:  [15-19] 19 (10/14 0538) BP: (127-144)/(69-77) 127/69 mmHg (10/14 0538) SpO2:  [96 %-99 %] 96 % (10/14 0538)   INTAKE / OUTPUT:  Intake/Output Summary (Last 24 hours) at 08/05/15 1112 Last data filed at 08/04/15 2143  Gross per 24 hour  Intake   3069 ml  Output      0 ml  Net   3069 ml    PHYSICAL EXAMINATION: General:   well appearing female in NAD Neuro:  Awake, alert, oriented x4, maew HEENT:  NCAT, EOMi, OP clear Cardiovascular:  RRR, no mgr Lungs:  Very min bilateral insp crackles no wheezing, normal respiratory effort Abdomen:  BS+, soft, nontender Musculoskeletal:  Normal bulk, tone Skin:  No rash or skin breakdown  LABS:  CBC  Recent Labs Lab 08/03/15 2037 08/04/15 0435 08/05/15 0355  WBC 9.9 6.6 5.7  HGB 14.1 12.9 13.4  HCT 41.2 38.4 40.0  PLT 279 244 240   BMET  Recent Labs Lab 08/03/15 2037 08/04/15 0435 08/05/15 0355  NA 137 139 139  K 3.1* 3.7 4.0  CL 100* 102 106  CO2 26 27 26   BUN 7 8 7   CREATININE 0.78 0.84 0.82  GLUCOSE 107* 100* 90   Electrolytes  Recent Labs Lab  08/03/15 2037 08/04/15 0435 08/05/15 0355  CALCIUM 9.6 8.9 8.7*   Liver Enzymes  Recent Labs Lab 08/03/15 2037  AST 24  ALT 16  ALKPHOS 66  BILITOT 0.6  ALBUMIN 4.1   Imaging CXR from 10/12 personally reviewed > mild interstitial changes bilaterally   ASSESSMENT / PLAN:  PULMONARY A:  Acute cough with respiratory distress in the setting of an abnormal chest x-ray > ddx includes acute viral vs atypical bacterial pneumonia (mycoplasma?) vs an autoimmune process. Favor infectious given the sinus congestion as well and her high resolution CT chest showed only very mild changes.  However, this could represent an autoimmune process so careful monitoring and response to antibiotic therapy is important. Would like to see the results of her rheumatology evaluation to see how she was diagnosed with sjogren's and lupus (both of which can be associated with ILD).  She also carries a hx of GERD, schatzki's ring and small hiatal hernia, raises the ? chronic aspiration contribution.    P:     Tussionex for Q12 PRN cough Respiratory viral panel >> neg   Will need PFT as outpatient and outpatient follow up If FPTS has a copy of the rheumatology assessment, please scan into the chart   Autoimmune assessment for evidence of lupus activity 10/13 ANA >>  neg Compliment >> nl  DS-DNA >> neg    Ok to send home on zmax/ceftin or levaquin 500 to complete 10 days total rx and max rx for GERD > f/u Dr [place] Bells in 2 weeks   Christinia Gully, MD Pulmonary and [place] [phone] After 5:30 PM or weekends, call [phone]

## 2015-08-10 ENCOUNTER — Telehealth: Payer: Self-pay | Admitting: *Deleted

## 2015-08-10 NOTE — Telephone Encounter (Signed)
Per BQ, pt needs HFU in 3-4 weeks with MR, TP or BQ See msg below Called pt and left message for her to call back to schedule HFU appt

## 2015-08-10 NOTE — Telephone Encounter (Signed)
-----   Message from Juanito Doom, MD sent at 08/08/2015  9:52 AM EDT ----- As stated below, she can follow up with either me or Tammy, or Dr. Chase Caller if he is OK with that. She needs follow up in 3-4 weeks.  ----- Message -----    From: Glean Hess, CMA    Sent: 08/05/2015   3:23 PM      To: Brand Males, MD, Juanito Doom, MD, #  BQ - your next available appt is 12/2. How soon do you need her in?  Please advise. ----- Message -----    From: Juanito Doom, MD    Sent: 08/05/2015   3:11 PM      To: Brand Males, MD, #  Triage, Please arrange hospital follow up with either me or Dr. Chase Caller if he agrees. Thanks Norm Parcel,  [place], recently diagnosed with Sjogren's and SLE by a new Rheumatologist in town (I have not yet seen labs to confirm). Admitted by FPTS with abnormal CXR, probable infectious etiology but we were consulted for possible NSIP.  I doubt NSIP but it is possible I guess.  Would you like to see her or do you want me to follow up?  Thanks [employer]

## 2015-08-11 NOTE — Telephone Encounter (Signed)
TE had been closed ------------------------------------- Attempted to contact patient to schedule HFU, left message to call back. Msg originated out of triage Sent to Triage for follow up

## 2015-08-12 NOTE — Telephone Encounter (Signed)
Lm for pt to call back to schedule HFU

## 2015-08-19 ENCOUNTER — Ambulatory Visit (INDEPENDENT_AMBULATORY_CARE_PROVIDER_SITE_OTHER): Payer: 59 | Admitting: Family Medicine

## 2015-08-19 ENCOUNTER — Encounter: Payer: Self-pay | Admitting: Family Medicine

## 2015-08-19 VITALS — BP 133/74 | HR 91 | Temp 98.5°F | Ht 66.0 in | Wt 188.0 lb

## 2015-08-19 DIAGNOSIS — L659 Nonscarring hair loss, unspecified: Secondary | ICD-10-CM

## 2015-08-19 DIAGNOSIS — J189 Pneumonia, unspecified organism: Secondary | ICD-10-CM

## 2015-08-19 DIAGNOSIS — M3213 Lung involvement in systemic lupus erythematosus: Secondary | ICD-10-CM

## 2015-08-19 DIAGNOSIS — E663 Overweight: Secondary | ICD-10-CM

## 2015-08-19 DIAGNOSIS — E039 Hypothyroidism, unspecified: Secondary | ICD-10-CM | POA: Diagnosis not present

## 2015-08-19 MED ORDER — PHENTERMINE HCL 37.5 MG PO TABS: 37.5000 mg | ORAL_TABLET | Freq: Every day | ORAL | Status: DC

## 2015-08-19 NOTE — Assessment & Plan Note (Signed)
Hospital follow up for dyspnea due to suspected SLE/overlying CAP. -improved s/p antibiotics -referral to pulmonology placed

## 2015-08-19 NOTE — Progress Notes (Signed)
   Subjective:    Patient ID: Gina Wise, female    DOB: 1961/10/10, 54 y.o.   MRN: [id number]  HPI 54 y/o female presents for hospital follow up. Admitted to [facility] from 08/03/15-08/06/15 for dyspnea related to SLE.  Dyspnea/overlying PNA - symptoms resolving, 90% improved, completed course of Cefdinir and Azithromycin, no longer needing tessalon pearls, still some cough in the AM but much improved, needs referral to Pulmonology.  Hair loss - new over the past few weeks, had previously with hypothyroid, Levothyroxine recently increased to 145mcg, due for recheck TSH in 2 weeks, allopecia is also a side effect of Plaquenil (symptoms started with initiation of this medication).  Weight Gain/Obesity - patient continues to have trouble losing weight, walking twice daily, admits to improved diet (salads/steamed vegetables), had previous success with Phentermine and Topamax (interested in trying these again).  Social - non smoker  Review of Systems See above    Objective:   Physical Exam Vitals: reviewed Gen: pleasant female, NAD Cardiac: RRR, S1 and S2 present, no murmur Resp: crackles in bilateral bases, normal effort Skin: diffuse thinning of hair on scalp  Reviewed hospital discharge records.      Assessment & Plan:  Lung disease with systemic lupus erythematosus [facility]) Hospital follow up for dyspnea due to suspected SLE/overlying CAP. -improved s/p antibiotics -referral to pulmonology placed  Hair loss Hair loss likely due to side effect of Plaquenil and hypothyroidism. -follow up with Rheumatology in December -due for TSH check in 2 weeks, future order placed in [facility]  Overweight BMI 30. Patient continues to exercise regularly and eat a healthy diet.  -attempt trial of Phentermine (prescription given) -return in one month for weight check

## 2015-08-19 NOTE — Assessment & Plan Note (Signed)
BMI 30. Patient continues to exercise regularly and eat a healthy diet.  -attempt trial of Phentermine (prescription given) -return in one month for weight check

## 2015-08-19 NOTE — Assessment & Plan Note (Signed)
Hair loss likely due to side effect of Plaquenil and hypothyroidism. -follow up with Rheumatology in December -due for TSH check in 2 weeks, future order placed in [facility]

## 2015-08-19 NOTE — Patient Instructions (Addendum)
It was nice to see you today.  Please schedule a lab visit in 2 weeks to check your thyroid level.   A new referral has been placed to pulmonology (My staff will contact you for an appointment).   You have been given a prescription for Adipex (phentermine). Please return in one month for a weight check.

## 2015-08-27 ENCOUNTER — Other Ambulatory Visit: Payer: Self-pay | Admitting: Family Medicine

## 2015-09-02 ENCOUNTER — Other Ambulatory Visit: Payer: 59

## 2015-09-02 DIAGNOSIS — E039 Hypothyroidism, unspecified: Secondary | ICD-10-CM

## 2015-09-03 LAB — TSH: TSH: 0.41 u[IU]/mL (ref 0.350–4.500)

## 2015-09-05 ENCOUNTER — Encounter: Payer: Self-pay | Admitting: Family Medicine

## 2015-09-06 ENCOUNTER — Telehealth: Payer: Self-pay | Admitting: Family Medicine

## 2015-09-06 NOTE — Telephone Encounter (Signed)
Wanted to talk to dr. Abbott Wise has diarrhea and had it since Saturday. Doesn't feel sick Please advise

## 2015-09-06 NOTE — Telephone Encounter (Signed)
Per dr Ree Kida call patient and tell her its possibly something viral and it had to run its course. He wanted her to stay well hydrated and call us back Friday if nothing has changed. [place] Kennon Holter, CMA

## 2015-09-20 ENCOUNTER — Telehealth: Payer: Self-pay | Admitting: Family Medicine

## 2015-09-20 NOTE — Telephone Encounter (Signed)
Pt faxed documents yesterday to the attn of robert and dr Ree Kida. Pt called to check to see if they had been received. These need to go back out by Friday Please advise

## 2015-09-20 NOTE — Telephone Encounter (Signed)
Pt called again about forms.

## 2015-09-21 NOTE — Telephone Encounter (Signed)
Left pt vm to return call

## 2015-09-21 NOTE — Telephone Encounter (Signed)
No forms in my physical mailbox. [facility] staff to contact patient to have her re-fax the forms.

## 2015-09-23 ENCOUNTER — Ambulatory Visit (INDEPENDENT_AMBULATORY_CARE_PROVIDER_SITE_OTHER)
Admission: RE | Admit: 2015-09-23 | Discharge: 2015-09-23 | Disposition: A | Discharge status: Home / Self Care | Payer: 59 | Source: Ambulatory Visit | Attending: Emergency Medicine | Admitting: Emergency Medicine

## 2015-09-23 ENCOUNTER — Ambulatory Visit (INDEPENDENT_AMBULATORY_CARE_PROVIDER_SITE_OTHER): Payer: 59 | Admitting: Pulmonary Disease

## 2015-09-23 ENCOUNTER — Encounter: Payer: Self-pay | Admitting: Pulmonary Disease

## 2015-09-23 VITALS — BP 130/84 | HR 97 | Temp 98.2°F | Ht 66.0 in | Wt 177.0 lb

## 2015-09-23 DIAGNOSIS — M3213 Lung involvement in systemic lupus erythematosus: Secondary | ICD-10-CM

## 2015-09-23 MED ORDER — DOXYCYCLINE HYCLATE 100 MG PO TABS: 100.0000 mg | ORAL_TABLET | Freq: Two times a day (BID) | ORAL | Status: DC

## 2015-09-23 MED ORDER — BENZONATATE 200 MG PO CAPS: 200.0000 mg | ORAL_CAPSULE | Freq: Three times a day (TID) | ORAL | Status: DC | PRN

## 2015-09-23 MED ORDER — HYDROCOD POLST-CPM POLST ER 10-8 MG/5ML PO SUER: 5.0000 mL | Freq: Two times a day (BID) | ORAL | Status: DC | PRN

## 2015-09-23 NOTE — Telephone Encounter (Signed)
RN staff - please call patient and inform her that I have completed paperwork and put in fax box. Thanks.

## 2015-09-23 NOTE — Patient Instructions (Addendum)
We will start you on Breo Ellipta inhaler Tussionex for cough suppression Doxycycline for 14 days Chest x-ray today  Return to clinic in 2 months

## 2015-09-23 NOTE — Telephone Encounter (Signed)
Pt informed. Deseree Blount, CMA  

## 2015-09-23 NOTE — Progress Notes (Signed)
Subjective:    Patient ID: Gina Wise, female    DOB: 1961/08/01, 54 y.o.   MRN: [id number]  HPI Follow-up after recent hospitalization for cough.  Gina Wise is a 53 year old with recent diagnosis of SLE, Sjogren's syndrome. She was recently hospitalized in October 2016 for cough, dyspnea in the setting of presumed community acquired pneumonia. At that time she had an abnormal chest x-ray and a CT scan which showed mild reticular changes suggestive of either NSIP or atypical infection. She was treated with ceftriaxone and azithromycin. Respiratory virus panel was negative. His symptoms improved and she was discharged. She continues to follow-up with her primary care physician and rheumatology at [facility]. She is now on Plaquenil and says that her lupus symptoms of arthritis has improved.  She continued to do well until 10 days ago when she started having dry nonproductive cough. She is not making sputum. She denies any fevers, chills, dyspnea, wheezing. She does not have any rhinitis, sinusitis, postnasal drip. She is on Nexium for her GI history of Schatzki's ring and gastritis. She denies any acid reflux or heartburn.  Past Medical History  Diagnosis Date  . Hypothyroid   . Overweight(278.02)   . Hypertension   . Peri-menopause   . Anemia   . High cholesterol   . Chronic bronchitis ([place])     "get it q yr; in the spring time" (02/17/2014)  . Multiple environmental allergies   . Migraines     "maybe a couple/yr" (02/17/2014)  . Depression     "have taken zoloft since 1996" (02/17/2014)  . Gastritis   . Schatzki's ring   . Diverticulosis   . GERD (gastroesophageal reflux disease)   . Allergy   . Sjogren's disease ([place])   . Systemic lupus ([place])   . Grover's disease     Current outpatient prescriptions:  .  acetaminophen (TYLENOL) 500 MG tablet, Take 500 mg by mouth every 6 (six) hours as needed for mild pain., Disp: , Rfl:  .  b complex vitamins capsule,  Take 1 capsule by mouth daily., Disp: , Rfl:  .  chlorpheniramine-HYDROcodone (TUSSIONEX) 10-8 MG/5ML SUER, Take 5 mLs by mouth every 12 (twelve) hours as needed for cough., Disp: 115 mL, Rfl: 0 .  cyclobenzaprine (FLEXERIL) 10 MG tablet, Take 10 mg by mouth 3 (three) times daily as needed for muscle spasms., Disp: , Rfl:  .  esomeprazole (NEXIUM) 40 MG capsule, Take 1 capsule (40 mg total) by mouth 2 (two) times daily., Disp: 60 capsule, Rfl: 2 .  hydrochlorothiazide (HYDRODIURIL) 25 MG tablet, Take 1 tablet (25 mg total) by mouth daily., Disp: 90 tablet, Rfl: 1 .  hydroxychloroquine (PLAQUENIL) 200 MG tablet, Take 200 mg by mouth every evening., Disp: , Rfl:  .  levothyroxine (SYNTHROID, LEVOTHROID) 112 MCG tablet, Take 1 tablet (112 mcg total) by mouth daily before breakfast., Disp: 30 tablet, Rfl: 2 .  Loratadine (CLARITIN) 10 MG CAPS, Take 10 mg by mouth daily., Disp: , Rfl:  .  phentermine (ADIPEX-P) 37.5 MG tablet, Take 1 tablet (37.5 mg total) by mouth daily before breakfast., Disp: 30 tablet, Rfl: 0 .  Polyvinyl Alcohol-Povidone (REFRESH OP), Apply 1 drop to eye daily as needed (dry eyes)., Disp: , Rfl:  .  promethazine (PHENERGAN) 12.5 MG tablet, Take 1 tablet (12.5 mg total) by mouth every 8 (eight) hours as needed for nausea or vomiting., Disp: 60 tablet, Rfl: 0 .  sertraline (ZOLOFT) 100 MG tablet, TAKE ONE TABLET BY  MOUTH ONCE DAILY, Disp: 90 tablet, Rfl: 0 .  SUMAtriptan (IMITREX) 100 MG tablet, Take 1 tablet (100 mg total) by mouth every 2 (two) hours as needed for migraine., Disp: 10 tablet, Rfl: 1 .  Vitamin D, Cholecalciferol, 400 UNITS TABS, Take 400 Units by mouth daily., Disp: , Rfl:  .  albuterol (PROVENTIL HFA;VENTOLIN HFA) 108 (90 BASE) MCG/ACT inhaler, Inhale 2 puffs into the lungs every 6 (six) hours as needed for wheezing or shortness of breath. (Patient not taking: Reported on 09/23/2015), Disp: 1 Inhaler, Rfl: 2 .  benzonatate (TESSALON) 200 MG capsule, Take 1 capsule (200  mg total) by mouth 3 (three) times daily as needed for cough., Disp: 30 capsule, Rfl: 1 .  doxycycline (VIBRA-TABS) 100 MG tablet, Take 1 tablet (100 mg total) by mouth 2 (two) times daily., Disp: 28 tablet, Rfl: 0 No current facility-administered medications for this visit.  Facility-Administered Medications Ordered in Other Visits:  .  0.9 %  sodium chloride infusion, , Intravenous, Continuous, Gina Dawn, MD, Last Rate: 150 mL/hr at 02/16/14 1213   Review of Systems Cough, no sputum, dyspnea, wheezing, hemoptysis Denies any fevers, chills, malaise, fatigue. Denies any nausea, vomiting, diarrhea, constipation. Denies any chest pain, palpitations. All other review of systems are negative    Objective:   Physical Exam  Blood pressure 130/84, pulse 97, temperature 98.2 F (36.8 C), temperature source Oral, height 5\' 6"  (1.676 m), weight 177 lb (80.287 kg), last menstrual period 04/03/2012, SpO2 97 %.  Gen. Mild distress, constant cough Neuro: No gross focal deficits. Neck: No JVD, lymphadenopathy, thyromegaly. RS: Clear to auscultation, no wheeze, crackles CVS: S1-S2 heard, no murmurs rubs gallops. Abdomen: Soft, positive bowel sounds. Extremities: No edema.    Assessment & Plan:   Nonproductive cough Lupus syndrome Sjogren's syndrome  After recent admission with cough she had improved with antibiotics. However over the past 10 days she her symptoms have recurred. Review of her CT scan shows very mild changes that could have been an atypical infection and not necessarily ILD. It is not clear if her lupus and Sjogren's is affecting her lung. She will eventually need PFTs and a high-resolution CT of the chest but I'll hold off for now as her cough has returned. She might have picked up another infection. She does not have any overt sinus symptoms of postnasal drip. She is already on a PPI for GI issues of Schatzki's ring and gastritis. There may be an element of aspiration  involved.  I'll get a chest x-ray today start by Gina Wise and treat her with Tussionex, Tessalon and doxycycline for 14 days. I'll also try to get her records from rheumatology. She'll return to clinic in 1 month. If her symptoms resolve then at that point we'll get the PFTs and the CT of the chest to reevaluate for ILD.  Plan: - Chest x-ray - Doxycycline 100 mg twice a day for 14 days - Tussionex and Tessalon for cough suppression - Continue Nexium. - Breo Ellipta   Marshell Garfinkel MD [facility] Pager [phone] If no answer or after 3pm call: [phone] 09/23/2015, 5:15 PM

## 2015-09-27 NOTE — Telephone Encounter (Signed)
Pt states the conpamy states they didnto receive the paperwork.   Fax  [phone]  [employer]

## 2015-09-28 NOTE — Telephone Encounter (Signed)
Forms refaxed to number provided

## 2015-09-30 ENCOUNTER — Encounter: Payer: Self-pay | Admitting: Pulmonary Disease

## 2015-10-07 ENCOUNTER — Ambulatory Visit (INDEPENDENT_AMBULATORY_CARE_PROVIDER_SITE_OTHER): Payer: 59 | Admitting: Family Medicine

## 2015-10-07 ENCOUNTER — Encounter: Payer: Self-pay | Admitting: Family Medicine

## 2015-10-07 VITALS — BP 138/76 | HR 91 | Temp 98.0°F | Ht 66.0 in | Wt 173.0 lb

## 2015-10-07 DIAGNOSIS — M3213 Lung involvement in systemic lupus erythematosus: Secondary | ICD-10-CM

## 2015-10-07 DIAGNOSIS — L659 Nonscarring hair loss, unspecified: Secondary | ICD-10-CM | POA: Diagnosis not present

## 2015-10-07 DIAGNOSIS — E663 Overweight: Secondary | ICD-10-CM

## 2015-10-07 MED ORDER — METHYLPREDNISOLONE SODIUM SUCC 125 MG IJ SOLR
125.0000 mg | Freq: Once | INTRAMUSCULAR | Status: AC
  Administered 2015-10-07: 125 mg via INTRAMUSCULAR

## 2015-10-07 MED ORDER — PREDNISONE 20 MG PO TABS: 20.0000 mg | ORAL_TABLET | Freq: Every day | ORAL | Status: DC

## 2015-10-07 MED ORDER — PHENTERMINE HCL 37.5 MG PO TABS: 37.5000 mg | ORAL_TABLET | Freq: Every day | ORAL | Status: DC

## 2015-10-07 NOTE — Assessment & Plan Note (Addendum)
Patient continues to have shortness of breath and cough related to lung disease from SLE. Patient has completed multiple rounds of antibiotics and is on cough medications. -Patient will be started on five-day burst of steroids -Likely needs second DMARD to help with lupus symptoms (follow up with rheumatology scheduled in 2 weeks)

## 2015-10-07 NOTE — Progress Notes (Signed)
   Subjective:    Patient ID: Gina Wise, female    DOB: May 23, 1961, 54 y.o.   MRN: [id number]  HPI 54 y/o female presents for follow up of weight gain, hair loss, and lung disease 2/2 SLE.  Weight gain - recently started on Phentermine (not taking daily, does not take when feeling well), has lost 15 lbs since last visit, not able to exercise due to shortness of breath  Hair loss - recent TSH wnl, also on Plaquenil which can cause hair loss; started vitamin for hair/skin which has helped (has biotin in it).   Lung disease likely 2/2 SLE - recently seen by pulmonology on 12/2, patient has recurrence of cough/sob, started on Doxycycline (completed course), Breo, and Tessalon; feels like she can't take a deep breath, still coughing throughout the day, dry in nature, exacerbated by talking  SLE - arthritis significantly improved, Follow up with Rheumatology scheduled for 12/27.   Social - nonsmoker   Review of Systems  Constitutional: Negative for fever and chills.  Respiratory: Positive for cough and shortness of breath.   Cardiovascular: Negative for chest pain.  Gastrointestinal: Negative for nausea, vomiting and diarrhea.       Objective:   Physical Exam Vitals: Reviewed Gen.: Pleasant female, no acute distress Cardiac: Regular rate and rhythm, S1 and S2 present, no murmurs, no heaves or thrills Respiratory: Crackles at bases, clear to auscultation otherwise, normal work of breathing  Reviewed recent pulmonology note    Assessment & Plan:  Overweight Patient has had 15 pound weight loss since initiation of Adipex. Encouraged continued healthy eating habits. -One month refill of Adipex provided -Follow-up in one month  Lung disease with systemic lupus erythematosus ([place]) Patient continues to have shortness of breath and cough related to lung disease from SLE. Patient has completed multiple rounds of antibiotics and is on cough medications. -Patient will be started on five-day  burst of steroids -Likely needs second DMARD to help with lupus symptoms (follow up with rheumatology scheduled in 2 weeks)  Hair loss Likely multifactorial. Recent TSH within normal limits. May be secondary to plaque well. Improved with over-the-counter vitamin. -continue to monitor clinically

## 2015-10-07 NOTE — Assessment & Plan Note (Signed)
Patient has had 15 pound weight loss since initiation of Adipex. Encouraged continued healthy eating habits. -One month refill of Adipex provided -Follow-up in one month

## 2015-10-07 NOTE — Patient Instructions (Signed)
It was nice to see you today.  Weight loss - congratulations on your weight loss, continue Adipex  Hair loss - continue vitamin for your hair  Shortness of Breath - I think this is a flair of your Lupus. Lets try a 5 day course of steroids.

## 2015-10-07 NOTE — Assessment & Plan Note (Signed)
Likely multifactorial. Recent TSH within normal limits. May be secondary to plaque well. Improved with over-the-counter vitamin. -continue to monitor clinically

## 2015-10-23 ENCOUNTER — Other Ambulatory Visit: Payer: Self-pay | Admitting: Family Medicine

## 2015-10-25 ENCOUNTER — Encounter: Payer: Self-pay | Admitting: Pulmonary Disease

## 2015-10-25 ENCOUNTER — Ambulatory Visit (INDEPENDENT_AMBULATORY_CARE_PROVIDER_SITE_OTHER): Payer: 59 | Admitting: Pulmonary Disease

## 2015-10-25 VITALS — BP 124/76 | HR 98 | Ht 66.0 in | Wt 176.6 lb

## 2015-10-25 DIAGNOSIS — J849 Interstitial pulmonary disease, unspecified: Secondary | ICD-10-CM

## 2015-10-25 NOTE — Patient Instructions (Signed)
Continue using the CellCept as prescribed by your rheumatologist. We will schedule you for lung function tests.  You will return to clinic in 3 months at which point we will repeat the CT scan of the chest.

## 2015-10-25 NOTE — Progress Notes (Signed)
Subjective:    Patient ID: Gina Wise, female    DOB: 1961-10-12, 55 y.o.   MRN: [id number]  PROBLEM LIST: Nonproductive cough Lupus syndrome Sjogren's syndrome Interstitial lung disease.  HPI [name] is a 55 year old with recent diagnosis of SLE, Sjogren's syndrome. She was recently hospitalized in October 2016 for cough, dyspnea in the setting of presumed community acquired pneumonia. At that time she had an abnormal chest x-ray and a CT scan which showed mild reticular changes suggestive of either NSIP or atypical infection. She was treated with ceftriaxone and azithromycin. Respiratory virus panel was negative. His symptoms improved and she was discharged. She continues to follow-up with her primary care physician and rheumatology at [facility].   She was seen by her primary care and a rheumatologist within the past month. She was given a short course of steroid taper and then started on CellCept.  She continues on Plaquenil. She still has dry nonproductive cough. She is not making sputum. She denies any fevers, chills, dyspnea, wheezing. She does not have any rhinitis, sinusitis, postnasal drip. She is on Nexium for her GI history of Schatzki's ring and gastritis. She denies any acid reflux or heartburn.   Past Medical History  Diagnosis Date  . Hypothyroid   . Overweight(278.02)   . Hypertension   . Peri-menopause   . Anemia   . High cholesterol   . Chronic bronchitis ([place])     "get it q yr; in the spring time" (02/17/2014)  . Multiple environmental allergies   . Migraines     "maybe a couple/yr" (02/17/2014)  . Depression     "have taken zoloft since 1996" (02/17/2014)  . Gastritis   . Schatzki's ring   . Diverticulosis   . GERD (gastroesophageal reflux disease)   . Allergy   . Sjogren's disease ([place])   . 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 Grover's disease     Current outpatient prescriptions:  .  acetaminophen (TYLENOL) 500 MG tablet, Take 500 mg by mouth  every 6 (six) hours as needed for mild pain., Disp: , Rfl:  .  albuterol (PROVENTIL HFA;VENTOLIN HFA) 108 (90 BASE) MCG/ACT inhaler, Inhale 2 puffs into the lungs every 6 (six) hours as needed for wheezing or shortness of breath., Disp: 1 Inhaler, Rfl: 2 .  b complex vitamins capsule, Take 1 capsule by mouth daily., Disp: , Rfl:  .  benzonatate (TESSALON) 200 MG capsule, Take 1 capsule (200 mg total) by mouth 3 (three) times daily as needed for cough., Disp: 30 capsule, Rfl: 1 .  chlorpheniramine-HYDROcodone (TUSSIONEX) 10-8 MG/5ML SUER, Take 5 mLs by mouth every 12 (twelve) hours as needed for cough., Disp: 115 mL, Rfl: 0 .  cyclobenzaprine (FLEXERIL) 10 MG tablet, Take 10 mg by mouth 3 (three) times daily as needed for muscle spasms., Disp: , Rfl:  .  esomeprazole (NEXIUM) 40 MG capsule, Take 1 capsule (40 mg total) by mouth 2 (two) times daily., Disp: 60 capsule, Rfl: 2 .  hydrochlorothiazide (HYDRODIURIL) 25 MG tablet, Take 1 tablet (25 mg total) by mouth daily., Disp: 90 tablet, Rfl: 1 .  hydroxychloroquine (PLAQUENIL) 200 MG tablet, Take 200 mg by mouth every evening., Disp: , Rfl:  .  levothyroxine (SYNTHROID, LEVOTHROID) 112 MCG tablet, TAKE ONE TABLET BY MOUTH ONCE DAILY BEFORE BREAKFAST, Disp: 30 tablet, Rfl: 5 .  Loratadine (CLARITIN) 10 MG CAPS, Take 10 mg by mouth daily., Disp: , Rfl:  .  mycophenolate (CELLCEPT) 500 MG tablet, Take 500  mg by mouth daily. (increasing up to 2,000mg  as directed by Rheumatology), Disp: , Rfl:  .  phentermine (ADIPEX-P) 37.5 MG tablet, Take 1 tablet (37.5 mg total) by mouth daily before breakfast., Disp: 30 tablet, Rfl: 0 .  Polyvinyl Alcohol-Povidone (REFRESH OP), Apply 1 drop to eye daily as needed (dry eyes)., Disp: , Rfl:  .  predniSONE (DELTASONE) 20 MG tablet, Take 1 tablet (20 mg total) by mouth daily with breakfast., Disp: 5 tablet, Rfl: 1 .  promethazine (PHENERGAN) 12.5 MG tablet, Take 1 tablet (12.5 mg total) by mouth every 8 (eight) hours as  needed for nausea or vomiting., Disp: 60 tablet, Rfl: 0 .  sertraline (ZOLOFT) 100 MG tablet, TAKE ONE TABLET BY MOUTH ONCE DAILY, Disp: 90 tablet, Rfl: 0 .  SUMAtriptan (IMITREX) 100 MG tablet, Take 1 tablet (100 mg total) by mouth every 2 (two) hours as needed for migraine., Disp: 10 tablet, Rfl: 1 .  Vitamin D, Cholecalciferol, 400 UNITS TABS, Take 400 Units by mouth daily., Disp: , Rfl:  No current facility-administered medications for this visit.  Facility-Administered Medications Ordered in Other Visits:  .  0.9 %  sodium chloride infusion, , Intravenous, Continuous, [name], MD, Last Rate: 150 mL/hr at 02/16/14 1213   Review of Systems Cough, no sputum, dyspnea, wheezing, hemoptysis Denies any fevers, chills, malaise, fatigue. Denies any nausea, vomiting, diarrhea, constipation. Denies any chest pain, palpitations. All other review of systems are negative    Objective:   Physical Exam  Blood pressure 124/76, pulse 98, height 5\' 6"  (1.676 m), weight 176 lb 9.6 oz (80.105 kg), last menstrual period 04/03/2012, SpO2 100 %. Gen. Mild distress, constant cough Neuro: No gross focal deficits. Neck: No JVD, lymphadenopathy, thyromegaly. RS: Clear to auscultation, no wheeze, crackles CVS: S1-S2 heard, no murmurs rubs gallops. Abdomen: Soft, positive bowel sounds. Extremities: No edema.    Assessment & Plan:  After recent admission with cough she had initially improved with antibiotics. Review of her CT scan shows very mild reticular changes could be secondary to ILD from lupus and Sjogren's. Her cough has returned and has not responded to repeat course of antibiotics and cough suppressants.  She started on CellCept by her rheumatologist in late December. She'll continue to increase the dose until she reaches 2000 mg. We followed by getting PFTs and a repeat CT of the chest.  She does not have any overt sinus symptoms of postnasal drip. She is already on a PPI for GI issues of  Schatzki's ring and gastritis. There may be an element of aspiration involved.  Plan: - Started on cellcept for ILD - Continue Nexium. - Breo Ellipta  - Pulmonary function tests - Repeat high-resolution CT in 3 months.   Marshell Garfinkel MD  Pulmonary and Critical Care Pager [phone] If no answer or after 3pm call: [phone] 10/25/2015, 2:45 PM

## 2015-11-01 ENCOUNTER — Ambulatory Visit (INDEPENDENT_AMBULATORY_CARE_PROVIDER_SITE_OTHER): Payer: 59 | Admitting: Internal Medicine

## 2015-11-01 ENCOUNTER — Encounter: Payer: Self-pay | Admitting: Internal Medicine

## 2015-11-01 VITALS — BP 132/75 | HR 96 | Temp 98.0°F | Wt 175.6 lb

## 2015-11-01 DIAGNOSIS — R0981 Nasal congestion: Secondary | ICD-10-CM

## 2015-11-01 DIAGNOSIS — J3489 Other specified disorders of nose and nasal sinuses: Secondary | ICD-10-CM

## 2015-11-01 DIAGNOSIS — G43811 Other migraine, intractable, with status migrainosus: Secondary | ICD-10-CM

## 2015-11-01 MED ORDER — PROMETHAZINE HCL 12.5 MG PO TABS: 12.5000 mg | ORAL_TABLET | Freq: Three times a day (TID) | ORAL | Status: DC | PRN

## 2015-11-01 MED ORDER — FLUTICASONE PROPIONATE 50 MCG/ACT NA SUSP: 2.0000 | Freq: Every day | NASAL | Status: DC

## 2015-11-01 MED ORDER — SUMATRIPTAN SUCCINATE 6 MG/0.5ML ~~LOC~~ SOLN
6.0000 mg | Freq: Once | SUBCUTANEOUS | Status: AC
  Administered 2015-11-01: 6 mg via SUBCUTANEOUS

## 2015-11-01 MED ORDER — SUMATRIPTAN SUCCINATE 100 MG PO TABS: 100.0000 mg | ORAL_TABLET | ORAL | Status: DC | PRN

## 2015-11-01 NOTE — Progress Notes (Addendum)
Subjective: Gina Wise is a 55 y.o. female patient of Gina Arbour, J, MD, presenting for migraine.  PMH: Lupus and Sjogren's Syndrome (recently started on plaquenil and cellcept) and recent diagnosis of ILD, Hypothyroidism, HLD, HTN, Depression, Migraine Headache, GERD  Migraine: - Began Friday and pain is sharp, constant and patient gestures that it affects the left side of her head from the top to in front of her ear at a diagonal; rates pain at a 6-7 out of 10 and says this is not the worst headache she's ever had  - Feels like migraines she has had in the past; last treated for a migraine in May 2016 - Changes in barometric pressure have triggered migraines in the past; have been less frequent since she moved from [place] - Tylenol hasn't help; ice helped some - Associated with nausea and vomiting but has been able to keep fluids down and ate cereal yesterday - She took 1 phenergan Saturday with little relief and only has 1 pill left (old prescription) - Has been out of sumatriptan - Increased her cellcept dose to 1500 mg on Friday (increasing by 500 mg weekly to achieve dose of 2000 mg, per rheumatology) and wondering if that could be responsible for symptoms - Light, movement and smells make pain worse; has mostly been in bed since symptoms started - Reviewed brain MRI from 2015, which was normal   Sinus Pain: - Started at same time as migraine - Reports scratchy throat on Thursday that resolved  - No runny nose, cough or wheezing - Left-sided pressure behind eye and in sinuses - 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 female in mild distress HEENT: PERRL. No erythema of oropharynx, no erythema of nasal turbinates, no lymphadenopathy, TTP over left frontal and maxillary sinuses Cardiac: RRR, S1, S2, no m/r/g Pulm: CTAB but decreased breath sounds over lung bases bilaterally, no wheezing or rhonchi MSK: FROM with neck forward and lateral flexion with mild tenderness with movement (chronic) Neuro: AOx3. CNII-XII intact. Visual acuity 20/25 bilaterally.   Assessment/Plan: Gina Wise is a 54 y.o. female here for treatment of migraine and left-sided sinus congestion. Patient drove herself to this appointment, so in-clinic treatment options were limited to less sedating medications. She had a good response to IM sumatriptan in May 2016, so this was the initial treatment chosen.   Advised patient to return if she develops fever or if pain worsens or does not resolve.   Migraine - Administered 6 mg sumatriptan while in office, which cut pain in half per patient - Refilled sumatriptan 100 mg to take q2h prn head pain - Refilled phenergan 12.5 mg to take q8h prn for nausea - Suggested taking benadryl, which patient had at home, for symptom relief as well  Frontal sinus pain - Prescribed flonase, with instructions to spray away from the nasal bone to reduce risk of worsening nosebleeds   Gina Floss, MD [place], PGY-1

## 2015-11-01 NOTE — Patient Instructions (Signed)
Gina Wise,  I am sorry you aren't feeling well.  I have prescribed sumatriptan 100 mg, which you can take up to every 2 hours for your migraine. I have also prescribed phenergan to help with nausea. Taking benadryl may also provide some symptom relief and help you get some sleep.  For your sinus pain, I have prescribed flonase.   If you develop a fever or have increased shortness of breath, please contact us.  Thank you, Dr. Ola Spurr

## 2015-11-01 NOTE — Assessment & Plan Note (Signed)
-   Prescribed flonase, with instructions to spray away from the nasal bone to reduce risk of worsening nosebleeds

## 2015-11-01 NOTE — Assessment & Plan Note (Signed)
-   Administered 6 mg sumatriptan while in office, which cut pain in half per patient - Refilled sumatriptan 100 mg to take q2h prn head pain - Refilled phenergan 12.5 mg to take q8h prn for nausea - Suggested taking benadryl, which patient had at home, for symptom relief as well

## 2015-11-02 ENCOUNTER — Encounter: Payer: Self-pay | Admitting: Family Medicine

## 2015-11-04 ENCOUNTER — Telehealth: Payer: Self-pay | Admitting: Pulmonary Disease

## 2015-11-04 MED ORDER — DOXYCYCLINE HYCLATE 100 MG PO TABS: 100.0000 mg | ORAL_TABLET | Freq: Two times a day (BID) | ORAL | Status: DC

## 2015-11-04 NOTE — Telephone Encounter (Signed)
Spoke with PM about pt's PFT, per PM rec rescheduling the PFT  Called spoke with patient, PFT rescheduled for 2.7.17 @ 4pm Patient still wanting something to help with symptoms, she is concerned she will 'end up in the hospital with pneumonia'.  She reports her cough 'exploded' over the past 36hrs, scratchy throat has been for 1 week  PM please advise, thank you.

## 2015-11-04 NOTE — Telephone Encounter (Signed)
Also c/o scratchy throat, PND. Prod cough with white/clear mucus X36 hours.   Denies fever.  Has taken otc allergy medication to help with s/s Pt also has a PFT scheduled for 4:00 today-wants to know if she should reschedule this.   Pt uses [facility] [address] in [place].    PM please advise.  Thanks!

## 2015-11-04 NOTE — Telephone Encounter (Signed)
Pt is on the phones and is unable to answer the phones they needs to leave a detailed msg or a note she is also is down for a breathing test today  [phone]

## 2015-11-04 NOTE — Telephone Encounter (Signed)
Pt calling back

## 2015-11-04 NOTE — Telephone Encounter (Signed)
lmtcb X1 for pt- this should be an urgent message.

## 2015-11-04 NOTE — Telephone Encounter (Signed)
Called spoke with patient, advised of PM's recs as stated below Pt voiced her understanding and denied any questions/concerns - she will contact the office if her symptoms do not improve or they worsen Nothing further needed; will sign off

## 2015-11-04 NOTE — Telephone Encounter (Signed)
Please call in doxy 100 mg bid for 7 days.

## 2015-11-07 ENCOUNTER — Encounter: Payer: Self-pay | Admitting: Family Medicine

## 2015-11-07 DIAGNOSIS — J8489 Other specified interstitial pulmonary diseases: Secondary | ICD-10-CM | POA: Insufficient documentation

## 2015-11-07 DIAGNOSIS — M359 Systemic involvement of connective tissue, unspecified: Secondary | ICD-10-CM | POA: Insufficient documentation

## 2015-11-07 DIAGNOSIS — M358 Other specified systemic involvement of connective tissue: Secondary | ICD-10-CM

## 2015-11-10 ENCOUNTER — Encounter: Payer: Self-pay | Admitting: Family Medicine

## 2015-11-24 ENCOUNTER — Telehealth: Payer: Self-pay | Admitting: Pulmonary Disease

## 2015-11-24 NOTE — Telephone Encounter (Signed)
lmtcb x1 for pt. 

## 2015-11-24 NOTE — Telephone Encounter (Signed)
lmtcb x2 for pt. 

## 2015-11-24 NOTE — Telephone Encounter (Signed)
Patient Returned call [phone]

## 2015-11-25 ENCOUNTER — Encounter: Payer: Self-pay | Admitting: Family Medicine

## 2015-11-25 NOTE — Telephone Encounter (Signed)
Called spoke with pt. She is scheduled to see MW at next available consult. Nothing further needed

## 2015-11-25 NOTE — Telephone Encounter (Signed)
Called and went straight to VM LMTCB x1 

## 2015-11-25 NOTE — Telephone Encounter (Signed)
OK with me.

## 2015-11-25 NOTE — Telephone Encounter (Signed)
Called spoke with pt. She is requesting to switch from Dr. Vaughan Browner to [name]. She reports nothing against Dr. Vaughan Browner.  Please advise Dr. Vaughan Browner if okay to switch thanks

## 2015-11-25 NOTE — Telephone Encounter (Signed)
Ok but needs consult slot and tell her When return bring your medications in 2 separate bags, the ones you take no matter(automatically)  what vs the as needed (only when you feel you need them)

## 2015-11-25 NOTE — Telephone Encounter (Signed)
Please advise Dr. Wert thanks 

## 2015-11-25 NOTE — Telephone Encounter (Signed)
Pt returning call  And can be reached @ [phone].Gina Wise

## 2015-12-01 ENCOUNTER — Ambulatory Visit (INDEPENDENT_AMBULATORY_CARE_PROVIDER_SITE_OTHER): Payer: 59 | Admitting: Internal Medicine

## 2015-12-01 ENCOUNTER — Encounter: Payer: Self-pay | Admitting: Family Medicine

## 2015-12-01 ENCOUNTER — Ambulatory Visit (INDEPENDENT_AMBULATORY_CARE_PROVIDER_SITE_OTHER): Payer: 59 | Admitting: Family Medicine

## 2015-12-01 VITALS — BP 140/82 | HR 80 | Temp 98.1°F | Ht 66.0 in | Wt 178.1 lb

## 2015-12-01 DIAGNOSIS — R040 Epistaxis: Secondary | ICD-10-CM

## 2015-12-01 DIAGNOSIS — G4700 Insomnia, unspecified: Secondary | ICD-10-CM | POA: Diagnosis not present

## 2015-12-01 DIAGNOSIS — M3213 Lung involvement in systemic lupus erythematosus: Secondary | ICD-10-CM | POA: Diagnosis not present

## 2015-12-01 DIAGNOSIS — E663 Overweight: Secondary | ICD-10-CM | POA: Diagnosis not present

## 2015-12-01 DIAGNOSIS — J849 Interstitial pulmonary disease, unspecified: Secondary | ICD-10-CM

## 2015-12-01 HISTORY — DX: Epistaxis: R04.0

## 2015-12-01 LAB — PULMONARY FUNCTION TEST
DL/VA % PRED: 84 %
DL/VA: 4.24 ml/min/mmHg/L
DLCO UNC % PRED: 84 %
DLCO unc: 22.66 ml/min/mmHg
FEF 25-75 PRE: 3.9 L/s
FEF 25-75 Post: 3.68 L/sec
FEF2575-%Change-Post: -5 %
FEF2575-%PRED-PRE: 142 %
FEF2575-%Pred-Post: 134 %
FEV1-%Change-Post: -3 %
FEV1-%Pred-Post: 109 %
FEV1-%Pred-Pre: 112 %
FEV1-PRE: 3.28 L
FEV1-Post: 3.18 L
FEV1FVC-%Change-Post: 0 %
FEV1FVC-%Pred-Pre: 108 %
FEV6-%CHANGE-POST: -1 %
FEV6-%PRED-POST: 103 %
FEV6-%PRED-PRE: 105 %
FEV6-POST: 3.73 L
FEV6-Pre: 3.79 L
FEV6FVC-%Change-Post: 0 %
FEV6FVC-%PRED-POST: 103 %
FEV6FVC-%Pred-Pre: 102 %
FVC-%CHANGE-POST: -2 %
FVC-%Pred-Post: 100 %
FVC-%Pred-Pre: 102 %
FVC-Post: 3.73 L
FVC-Pre: 3.82 L
POST FEV6/FVC RATIO: 100 %
PRE FEV1/FVC RATIO: 86 %
Post FEV1/FVC ratio: 85 %
Pre FEV6/FVC Ratio: 99 %

## 2015-12-01 MED ORDER — ZOLPIDEM TARTRATE 5 MG PO TABS: 5.0000 mg | ORAL_TABLET | Freq: Every evening | ORAL | Status: DC | PRN

## 2015-12-01 NOTE — Progress Notes (Signed)
   Subjective:    Patient ID: Gina Wise, female    DOB: 01-Jul-1961, 55 y.o.   MRN: [id number]  HPI  Weight loss - stopped Phetermine due to side effect of Cellcept, still walking the dog. Up 3 pounds since last visit.   ILD - still taking Plaquenil 200 mg daily, Cellcept down to 1000 daily, now having increased shortness of breath since decreasing Cellcept from 2000, Rheum considering adding back prednisone (patient not happy about this due to weight gain). Has PFT's later today. Going to see [name] on 2/24.   Epistaxis - now 2-3 times per day, started a few weeks ago (previously seen by Dr Kandice Robinsons who moved away, now seen by Dossie Der). Does nasal saline drops and humidified air.   Social - nonsmoker  Review of Systems  Constitutional: Negative for fever, chills and fatigue.  Respiratory: Negative for chest tightness.   Cardiovascular: Negative for chest pain and leg swelling.  Gastrointestinal: Negative for nausea and diarrhea.       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 -Rheumatology gradually increased Cellcept dose (currently 1000 mg daily) -continue Plaquenil 200 mg daily -Rheumatology considering Prednisone (patient reluctant due to weight gain) -PFT's later today -Follow up scheduled with Rheum and Pulmonology.   Epistaxis Anterior plexus epistaxis of left nasal septum. Likely not healing due to Cellcept. -referral to ENT for cautery.   Overweight Patient stopped Phentermine due to side effects from Cellcept. -will hold on this medication for now and  revisit once DMARD medications are stabilized -encouraged continued exercise as tolerated

## 2015-12-01 NOTE — Assessment & Plan Note (Signed)
Minimal improvement with Plaquenil and Cellcept. -Rheumatology gradually increased Cellcept dose (currently 1000 mg daily) -continue Plaquenil 200 mg daily -Rheumatology considering Prednisone (patient reluctant due to weight gain) -PFT's later today -Follow up scheduled with Rheum and Pulmonology.

## 2015-12-01 NOTE — Progress Notes (Signed)
PFT done today. 

## 2015-12-01 NOTE — Patient Instructions (Signed)
It was nice to see you today.  Nose bleeds - referral placed to ENT, you will hear from my office to schedule  Weight loss - hold off on now, restart Phentermine at later date.

## 2015-12-01 NOTE — Assessment & Plan Note (Signed)
Anterior plexus epistaxis of left nasal septum. Likely not healing due to Cellcept. -referral to ENT for cautery.

## 2015-12-01 NOTE — Assessment & Plan Note (Signed)
Patient stopped Phentermine due to side effects from Cellcept. -will hold on this medication for now and revisit once DMARD medications are stabilized -encouraged continued exercise as tolerated

## 2015-12-05 ENCOUNTER — Ambulatory Visit: Payer: 59 | Admitting: Pulmonary Disease

## 2015-12-06 ENCOUNTER — Encounter (HOSPITAL_COMMUNITY): Payer: Self-pay | Admitting: Neurology

## 2015-12-06 ENCOUNTER — Emergency Department (HOSPITAL_COMMUNITY)
Admission: EM | Admit: 2015-12-06 | Discharge: 2015-12-06 | Disposition: A | Discharge status: Home / Self Care | Payer: 59 | Attending: Emergency Medicine | Admitting: Emergency Medicine

## 2015-12-06 DIAGNOSIS — Z7951 Long term (current) use of inhaled steroids: Secondary | ICD-10-CM | POA: Insufficient documentation

## 2015-12-06 DIAGNOSIS — E663 Overweight: Secondary | ICD-10-CM | POA: Insufficient documentation

## 2015-12-06 DIAGNOSIS — R040 Epistaxis: Secondary | ICD-10-CM | POA: Diagnosis present

## 2015-12-06 DIAGNOSIS — E039 Hypothyroidism, unspecified: Secondary | ICD-10-CM | POA: Insufficient documentation

## 2015-12-06 DIAGNOSIS — Z79899 Other long term (current) drug therapy: Secondary | ICD-10-CM | POA: Diagnosis not present

## 2015-12-06 DIAGNOSIS — Z7952 Long term (current) use of systemic steroids: Secondary | ICD-10-CM | POA: Insufficient documentation

## 2015-12-06 DIAGNOSIS — E782 Mixed hyperlipidemia: Secondary | ICD-10-CM | POA: Diagnosis not present

## 2015-12-06 DIAGNOSIS — Z862 Personal history of diseases of the blood and blood-forming organs and certain disorders involving the immune mechanism: Secondary | ICD-10-CM | POA: Diagnosis not present

## 2015-12-06 DIAGNOSIS — K219 Gastro-esophageal reflux disease without esophagitis: Secondary | ICD-10-CM | POA: Insufficient documentation

## 2015-12-06 DIAGNOSIS — I10 Essential (primary) hypertension: Secondary | ICD-10-CM | POA: Diagnosis not present

## 2015-12-06 MED ORDER — PHENYLEPHRINE HCL 0.5 % NA SOLN
1.0000 [drp] | Freq: Once | NASAL | Status: AC
  Administered 2015-12-06: 1 [drp] via NASAL
  Filled 2015-12-06: qty 15

## 2015-12-06 NOTE — ED Notes (Addendum)
Pt here for nosebleeds for 1 month about 2-3 a day. Has hx of lupus, interstitial lung disease. No blood thinners.  No bleeding at current.

## 2015-12-06 NOTE — Discharge Instructions (Signed)

## 2015-12-06 NOTE — ED Provider Notes (Signed)
CSN: [id number]     Arrival date & time 12/06/15  1024 History   First MD Initiated Contact with Patient 12/06/15 1149     Chief Complaint  Patient presents with  . Epistaxis     (Consider location/radiation/quality/duration/timing/severity/associated sxs/prior Treatment) HPI Patient has been getting periodic nosebleeds for about a month. Over these past several days she reports that his bleeding 2 or 3 times a day. She reports it would just drip for a while. It eventually stops. She reports that it might take up to 40 minutes. She states this is getting in the way of her work. She saw her primary care physician who suggested trying Vaseline application. This is not helping. The patient reports that she does have a history of lupus and interstitial lung disease. She is not on any blood thinners. She is however on plaque but no and CellCept. She reports her physician checked lab work last week and there were no problems with her lab work. She reports they plan to send her to ENT but she does not have an appointment yet. Past Medical History  Diagnosis Date  . Hypothyroid   . Overweight(278.02)   . Hypertension   . Peri-menopause   . Anemia   . High cholesterol   . Chronic bronchitis ([place])     "get it q yr; in the spring time" (02/17/2014)  . Multiple environmental allergies   . Migraines     "maybe a couple/yr" (02/17/2014)  . Depression     "have taken zoloft since 1996" (02/17/2014)  . Gastritis   . Schatzki's ring   . Diverticulosis   . GERD (gastroesophageal reflux disease)   . Allergy   . Sjogren's disease ([place])   . Systemic lupus ([place])   . Grover's disease    Past Surgical History  Procedure Laterality Date  . Shoulder open rotator cuff repair Left 1995  . Shoulder surgery Left 1997    "cartilage"  . Exploratory laparotomy  1987  . Inguinal hernia repair Right ~ 2003  . Appendectomy  1987  . Tubal ligation  1998    laparoscopic  . Vaginal hysterectomy  04/08/2012     menorrhagia  . Tonsillectomy and adenoidectomy  1976  . Foot surgery Right ~1980    "don't know what they did; had to take something out"  . Esophagogastroduodenoscopy N/A 02/28/2014    Procedure: ESOPHAGOGASTRODUODENOSCOPY (EGD);  Surgeon: Jerene Bears, MD;  Location: [facility] ENDOSCOPY;  Service: Endoscopy;  Laterality: N/A;   Family History  Problem Relation Age of Onset  . Hypertension Father   . Emphysema Father   . Vision loss Father   . Alcohol abuse Father   . Hypertension Mother   . Heart disease Mother   . Hepatitis Mother     Hep C  . Endometriosis Mother   . Lupus Sister   . Endometriosis Sister   . Breast cancer Maternal Aunt     30's  . Breast cancer Paternal Aunt     64's  . Lung cancer Paternal Uncle     smoker  . Colon cancer Maternal Grandfather   . Uterine cancer Maternal Aunt     50's  . Cervical cancer Paternal Aunt     40's  . Lung cancer Paternal Uncle   . Stomach cancer Neg Hx    Social History  Substance Use Topics  . Smoking status: Never Smoker   . Smokeless tobacco: Never Used  . Alcohol Use: 0.0 oz/week  0 Standard drinks or equivalent per week     Comment: 02/17/2014 "might have a few drinks a couple times/yr"   OB History    Gravida Para Term Preterm AB TAB SAB Ectopic Multiple Living   2 2 2       2      Review of Systems Constitutional: No fevers no chills Respiratory: No cough or shortness of breath.   Allergies  Codeine; Erythromycin; and Iohexol  Home Medications   Prior to Admission medications   Medication Sig Start Date End Date Taking? Authorizing Provider  acetaminophen (TYLENOL) 500 MG tablet Take 500 mg by mouth every 6 (six) hours as needed for mild pain.   Yes Historical Provider, MD  albuterol (PROVENTIL HFA;VENTOLIN HFA) 108 (90 BASE) MCG/ACT inhaler Inhale 2 puffs into the lungs every 6 (six) hours as needed for wheezing or shortness of breath. 07/15/15  Yes [name], MD  b complex vitamins capsule Take 1  capsule by mouth daily.   Yes Historical Provider, MD  cyclobenzaprine (FLEXERIL) 10 MG tablet Take 10 mg by mouth 3 (three) times daily as needed for muscle spasms.   Yes Historical Provider, MD  esomeprazole (NEXIUM) 40 MG capsule Take 1 capsule (40 mg total) by mouth 2 (two) times daily. 04/28/15  Yes Jerene Bears, MD  fluticasone (FLONASE) 50 MCG/ACT nasal spray Place 2 sprays into both nostrils daily. 11/01/15  Yes Hillary Corinda Gubler, MD  hydrochlorothiazide (HYDRODIURIL) 25 MG tablet Take 1 tablet (25 mg total) by mouth daily. 07/14/15  Yes [name], MD  hydroxychloroquine (PLAQUENIL) 200 MG tablet Take 200 mg by mouth every evening.   Yes Historical Provider, MD  levothyroxine (SYNTHROID, LEVOTHROID) 112 MCG tablet TAKE ONE TABLET BY MOUTH ONCE DAILY BEFORE BREAKFAST 10/25/15  Yes [name], MD  Loratadine (CLARITIN) 10 MG CAPS Take 10 mg by mouth daily.   Yes Historical Provider, MD  mycophenolate (CELLCEPT) 500 MG tablet Take 500 mg by mouth 2 (two) times daily. (increasing up to 2,000mg  as directed by Rheumatology) 10/13/15  Yes Historical Provider, MD  omeprazole (PRILOSEC) 40 MG capsule Take 40 mg by mouth daily.   Yes Historical Provider, MD  Polyvinyl Alcohol-Povidone (REFRESH OP) Apply 1 drop to eye daily as needed (dry eyes).   Yes Historical Provider, MD  sertraline (ZOLOFT) 100 MG tablet Take 50 mg by mouth daily.   Yes Historical Provider, MD  SUMAtriptan (IMITREX) 100 MG tablet Take 1 tablet (100 mg total) by mouth every 2 (two) hours as needed for migraine. 11/01/15  Yes Hillary Corinda Gubler, MD  Vitamin D, Cholecalciferol, 400 UNITS TABS Take 400 Units by mouth daily.   Yes Historical Provider, MD  zolpidem (AMBIEN) 5 MG tablet Take 1 tablet (5 mg total) by mouth at bedtime as needed for sleep. 12/01/15  Yes [name], MD  benzonatate (TESSALON) 200 MG capsule Take 1 capsule (200 mg total) by mouth 3 (three) times daily as needed for cough. 09/23/15   Praveen Mannam,  MD  chlorpheniramine-HYDROcodone (TUSSIONEX) 10-8 MG/5ML SUER Take 5 mLs by mouth every 12 (twelve) hours as needed for cough. 09/23/15   Praveen Mannam, MD  doxycycline (VIBRA-TABS) 100 MG tablet Take 1 tablet (100 mg total) by mouth 2 (two) times daily. 11/04/15   Marshell Garfinkel, MD  phentermine (ADIPEX-P) 37.5 MG tablet Take 1 tablet (37.5 mg total) by mouth daily before breakfast. 10/07/15   [name], MD  predniSONE (DELTASONE) 20 MG tablet Take  1 tablet (20 mg total) by mouth daily with breakfast. 10/07/15   [name], MD  promethazine (PHENERGAN) 12.5 MG tablet Take 1 tablet (12.5 mg total) by mouth every 8 (eight) hours as needed for nausea or vomiting. 11/01/15   Hillary Corinda Gubler, MD  sertraline (ZOLOFT) 100 MG tablet TAKE ONE TABLET BY MOUTH ONCE DAILY 08/29/15   [name], MD   BP 134/73 mmHg  Pulse 83  Temp(Src) 97.9 F (36.6 C) (Oral)  Resp 14  SpO2 100%  LMP 04/03/2012 Physical Exam  Constitutional: She is oriented to person, place, and time. She appears well-developed and well-nourished.  HENT:  Head: Normocephalic and atraumatic.  Right Ear: External ear normal.  Left Ear: External ear normal.  Mouth/Throat: Oropharynx is clear and moist.  Left naris has superficial erosion to the septum that appears approximately half a centimeter. No active bleeding. There appears to be corresponding erythema and very superficial erosion to the lateral turbinate, no active bleeding. There is no clot in the nasal passage. No sites of bleeding in the right nare.  Eyes: EOM are normal. Pupils are equal, round, and reactive to light.  Neck: Neck supple.  Pulmonary/Chest: Effort normal.  Musculoskeletal: Normal range of motion.  Neurological: She is alert and oriented to person, place, and time. No cranial nerve deficit.  Skin: Skin is warm and dry.  Psychiatric: She has a normal mood and affect.    ED Course  Procedures (including critical care time) Epistaxis  treatment: Nasal passage cleaned with Neo-Synephrine soaked pledget. Speculum examination performed. 2 adjacent sites of superficial erythema and erosion identified on the septum and the corresponding inferior aspect of the turbinate. Silver nitrate stick used to cauterize the entire area. Reexamination no area of erythema or erosion visible.  Labs Review Labs Reviewed - No data to display  Imaging Review No results found. I have personally reviewed and evaluated these images and lab results as part of my medical decision-making.   EKG Interpretation None      MDM   Final diagnoses:  Anterior epistaxis   Patient has developed frequent anterior epistaxis. There are superficial sites that were amenable to silver nitrate treatment. Patient is counseled on the necessity to follow-up with ENT due to her underlying diseases and the chronicity of her bleeds. She is discharged in stable condition. Counseling provided for managing anterior nosebleeds.    Charlesetta Shanks, MD 12/06/15 [phone]

## 2015-12-16 ENCOUNTER — Encounter: Payer: Self-pay | Admitting: Internal Medicine

## 2015-12-16 ENCOUNTER — Ambulatory Visit (INDEPENDENT_AMBULATORY_CARE_PROVIDER_SITE_OTHER): Payer: 59 | Admitting: Internal Medicine

## 2015-12-16 VITALS — BP 132/86 | HR 90 | Ht 66.0 in | Wt 182.0 lb

## 2015-12-16 DIAGNOSIS — M358 Other specified systemic involvement of connective tissue: Secondary | ICD-10-CM

## 2015-12-16 DIAGNOSIS — R058 Other specified cough: Secondary | ICD-10-CM | POA: Insufficient documentation

## 2015-12-16 DIAGNOSIS — J8489 Other specified interstitial pulmonary diseases: Secondary | ICD-10-CM | POA: Diagnosis not present

## 2015-12-16 DIAGNOSIS — R05 Cough: Secondary | ICD-10-CM

## 2015-12-16 MED ORDER — ESOMEPRAZOLE MAGNESIUM 40 MG PO CPDR: 40.0000 mg | DELAYED_RELEASE_CAPSULE | Freq: Two times a day (BID) | ORAL | Status: DC

## 2015-12-16 MED ORDER — HYDROCOD POLST-CPM POLST ER 10-8 MG/5ML PO SUER: 5.0000 mL | Freq: Two times a day (BID) | ORAL | Status: DC | PRN

## 2015-12-16 NOTE — Patient Instructions (Addendum)
Stop prilosec and start nexium 40 mg Take 30- 60 min before your first and last meals of the day   I emphasized that nasal steroids have no immediate benefit in terms of improving symptoms.  To help them reached the target tissue, the patient should use Afrin two puffs every 12 hours applied one min before using the nasal steroids.  Afrin should be stopped after no more than 5 days.  If the symptoms worsen, Afrin can be restarted after 5 days off of therapy to prevent rebound congestion from overuse of Afrin.  I also emphasized that in no way are nasal steroids a concern in terms of "addiction".   GERD (REFLUX)  is an extremely common cause of respiratory symptoms just like yours , many times with no obvious heartburn at all.    It can be treated with medication, but also with lifestyle changes including elevation of the head of your bed (ideally with 6 inch  bed blocks),  Smoking cessation, avoidance of late meals, excessive alcohol, and avoid fatty foods, chocolate, peppermint, colas, red wine, and acidic juices such as orange juice.  NO MINT OR MENTHOL PRODUCTS SO NO COUGH DROPS  USE SUGARLESS CANDY INSTEAD (Jolley ranchers or Stover's or Life Savers) or even ice chips will also do - the key is to swallow to prevent all throat clearing. NO OIL BASED VITAMINS - use powdered substitutes  Start Tussionex one tsp every 4 hours and then gradually reduce it over about 3-5 days and replace with delsym otc as needed   If losing ground with exercise tolerance or cough not resolved or Dr Titus Mould needs to monitor your lung funciton or High resolution CT to adjust your pulmonary medications I would be happy to see you back but regular pulmonary follow up is as needed

## 2015-12-16 NOTE — Progress Notes (Signed)
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 NSIP on CT p about a month of symptoms and cellcept added  by Virk p admit and increased by [facility] and seen By Dr Vaughan Browner with rec 09/23/15 We will start you on Breo Ellipta inhaler> cough worse so stopped  Tussionex for cough suppression> transient improvement  Doxycycline for 14 days> no change    12/16/2015 1st   office visit/ Evonda Enge  :  Second opinion re cough Jaynie Bream  Chief Complaint  Patient presents with  . Pulmonary Consult    Pt switching from Dr Vaughan Browner. She states "I am always out of breath"- esp worse at hs has to sleep propped up. She also c/o increased cough "since everything is blooming"- non prod.   has not recovered sob since fall 2016 assoc always with the dry cough 24/7 and has to sleep 30 degrees better with right side down    No obvious day to day or daytime variability or assoc chronic cough or cp or chest tightness, subjective wheeze or overt sinus or hb symptoms. No unusual exp hx or h/o childhood pna/ asthma or knowledge of premature birth.  Sleeping ok without nocturnal  or early am exacerbation  of respiratory  c/o's or need for noct saba. Also denies any obvious fluctuation of symptoms with weather or environmental changes or other aggravating or alleviating factors except as outlined above   Current Medications, Allergies, Complete Past Medical History, Past Surgical History,  Family History, and Social History were reviewed in [employer] record.  ROS  The following are not active complaints unless bolded sore throat, dysphagia, dental problems, itching, sneezing,  nasal congestion or excess/ purulent secretions, ear ache,   fever, chills, sweats, unintended wt loss, classically pleuritic or exertional cp, hemoptysis,  orthopnea pnd or leg swelling, presyncope, palpitations, abdominal pain, anorexia, nausea, vomiting, diarrhea  or change in bowel or bladder habits, change in stools or urine, dysuria,hematuria,  rash, arthralgias, visual complaints, headache, numbness, weakness or ataxia or problems with walking or coordination,  change in mood/affect or memory.                      Objective:   Physical Exam  Wt Readings from Last 3 Encounters:  12/16/15 182 lb (82.555 kg)  12/01/15 178 lb 1.6 oz (80.786 kg)  11/01/15 175 lb 9.6 oz (79.652 kg)    Vital signs reviewed   HEENT: nl dentition, turbinates, and oropharynx. Nl external ear canals without cough reflex   NECK :  without JVD/Nodes/TM/ nl carotid upstrokes bilaterally   LUNGS: no acc muscle use,  Nl contour chest which is clear to A and P bilaterally without cough on insp or exp maneuvers   CV:  RRR  no s3 or murmur or increase in P2, no edema   ABD:  soft and nontender with nl inspiratory  excursion in the supine position. No bruits or organomegaly, bowel sounds nl  MS:  Nl gait/ ext warm without deformities, calf tenderness, cyanosis or clubbing No obvious joint restrictions   SKIN: warm and dry without lesions    NEURO:  alert, approp, nl sensorium with  no motor deficits     I personally reviewed images and agree with radiology impression as follows:  CXR:  09/23/15   1. No acute cardiopulmonary disease. 2. Stable faint reticulation at the peripheral lung bases, see 07/26/2015 high-resolution chest CT study for further details.        Assessment & Plan:

## 2015-12-16 NOTE — Assessment & Plan Note (Addendum)
Dx. 07/2015 Dr. Vaughan Browner - 12/16/2015  Walked RA x 3 laps @ 20 ft each stopped due to  End of study, fast pace, sob and "wheezy" with sats 100% and clear exam   DDx for pulmonary fibrosis  includes idiopathic pulmonary fibrosis, pulmonary fibrosis associated with rheumatologic diseases (which have a relatively benign course in most cases) , adverse effect from  drugs such as chemotherapy or amiodarone exposure, nonspecific interstitial pneumonia which is typically steroid responsive and freq assoc with CTD, and chronic hypersensitivity pneumonitis.   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

## 2015-12-16 NOTE — Assessment & Plan Note (Addendum)
The most common causes of chronic cough in immunocompetent adults include the following: upper airway cough syndrome (UACS), previously referred to as postnasal drip syndrome (PNDS), which is caused by variety of rhinosinus conditions; (2) asthma; (3) GERD; (4) chronic bronchitis from cigarette smoking or other inhaled environmental irritants; (5) nonasthmatic eosinophilic bronchitis; and (6) bronchiectasis.   These conditions, singly or in combination, have accounted for up to 94% of the causes of chronic cough in prospective studies.   Other conditions have constituted no >6% of the causes in prospective studies These have included bronchogenic carcinoma, chronic interstitial pneumonia, sarcoidosis, left ventricular failure, ACEI-induced cough, and aspiration from a condition associated with pharyngeal dysfunction.    Chronic cough is often simultaneously caused by more than one condition. A single cause has been found from 38 to 82% of the time, multiple causes from 18 to 62%. Multiply caused cough has been the result of three diseases up to 42% of the time.       Based on hx and exam, this is most likely:  Classic Upper airway cough syndrome, so named because it's frequently impossible to sort out how much is  CR/sinusitis with freq throat clearing (which can be related to primary GERD)   vs  causing  secondary (" extra esophageal")  GERD from wide swings in gastric pressure that occur with throat clearing, often  promoting self use of mint and menthol lozenges that reduce the lower esophageal sphincter tone and exacerbate the problem further in a cyclical fashion.   These are the same pts (now being labeled as having "irritable larynx syndrome" by some cough centers) who not infrequently have a history of having failed to tolerate ace inhibitors,  dry powder inhalers (like BREO) or biphosphonates or report having atypical reflux symptoms that don't respond to standard doses of PPI , and are easily  confused as having aecopd or asthma flares by even experienced allergists/ pulmonologists.   The first step is to maximize GERD Rx  and eliminate cyclical coughing then regroup if the cough persists.  I had an extended discussion with the patient reviewing all relevant studies completed to date and  lasting 25/40 min ov      Each maintenance medication was reviewed in detail including most importantly the difference between maintenance and prns and under what circumstances the prns are to be triggered using an action plan format that is not reflected in the computer generated alphabetically organized AVS.    Please see instructions for details which were reviewed in writing and the patient given a copy highlighting the part that I personally wrote and discussed at today's ov.   See instructions for specific recommendations which were reviewed directly with the patient who was given a copy with highlighter outlining the key components.

## 2016-01-06 ENCOUNTER — Other Ambulatory Visit: Payer: Self-pay | Admitting: Internal Medicine

## 2016-01-06 DIAGNOSIS — J8489 Other specified interstitial pulmonary diseases: Secondary | ICD-10-CM

## 2016-01-06 DIAGNOSIS — M358 Other specified systemic involvement of connective tissue: Principal | ICD-10-CM

## 2016-01-20 ENCOUNTER — Encounter: Payer: Self-pay | Admitting: Family Medicine

## 2016-01-20 NOTE — Progress Notes (Signed)
Completed FMLA paperwork.  Will fax to [place] [facility] staff please call patient to inform her of the above. Increased the number of sick days.

## 2016-01-20 NOTE — Progress Notes (Signed)
Pt informed by telephone. Vaida Kerchner Kennon Holter, CMA

## 2016-01-23 ENCOUNTER — Ambulatory Visit: Payer: 59 | Admitting: Pulmonary Disease

## 2016-01-25 ENCOUNTER — Ambulatory Visit: Payer: 59 | Admitting: Internal Medicine

## 2016-01-26 ENCOUNTER — Other Ambulatory Visit: Payer: 59

## 2016-01-26 ENCOUNTER — Inpatient Hospital Stay: Admission: RE | Admit: 2016-01-26 | Payer: 59 | Source: Ambulatory Visit

## 2016-02-01 ENCOUNTER — Other Ambulatory Visit: Payer: Self-pay | Admitting: *Deleted

## 2016-02-01 DIAGNOSIS — G43811 Other migraine, intractable, with status migrainosus: Secondary | ICD-10-CM

## 2016-02-01 MED ORDER — LEVOTHYROXINE SODIUM 112 MCG PO TABS: 112.0000 ug | ORAL_TABLET | Freq: Every day | ORAL | Status: DC

## 2016-02-01 MED ORDER — SUMATRIPTAN SUCCINATE 100 MG PO TABS
100.0000 mg | ORAL_TABLET | ORAL | Status: DC | PRN
Start: 2016-02-01 — End: 2016-12-01

## 2016-02-06 ENCOUNTER — Other Ambulatory Visit: Payer: Self-pay | Admitting: *Deleted

## 2016-02-06 MED ORDER — SERTRALINE HCL 100 MG PO TABS: 50.0000 mg | ORAL_TABLET | Freq: Every day | ORAL | Status: DC

## 2016-02-09 ENCOUNTER — Encounter: Payer: Self-pay | Admitting: Family Medicine

## 2016-02-16 ENCOUNTER — Encounter: Payer: Self-pay | Admitting: Family Medicine

## 2016-02-16 ENCOUNTER — Ambulatory Visit (INDEPENDENT_AMBULATORY_CARE_PROVIDER_SITE_OTHER): Payer: 59 | Admitting: Family Medicine

## 2016-02-16 VITALS — BP 130/80 | HR 100 | Temp 98.3°F | Ht 66.0 in | Wt 184.0 lb

## 2016-02-16 DIAGNOSIS — L989 Disorder of the skin and subcutaneous tissue, unspecified: Secondary | ICD-10-CM | POA: Diagnosis not present

## 2016-02-16 DIAGNOSIS — L814 Other melanin hyperpigmentation: Secondary | ICD-10-CM | POA: Diagnosis not present

## 2016-02-16 DIAGNOSIS — M3213 Lung involvement in systemic lupus erythematosus: Secondary | ICD-10-CM | POA: Diagnosis not present

## 2016-02-16 DIAGNOSIS — M329 Systemic lupus erythematosus, unspecified: Secondary | ICD-10-CM | POA: Diagnosis not present

## 2016-02-16 NOTE — Patient Instructions (Signed)
It was nice to see you today.  I am sorry that you are not feeling well.  I will call Dr. Audelia Hives office and see if there is anything we can do about the joint pain and fatigue.

## 2016-02-16 NOTE — Progress Notes (Deleted)
Subjective:     Patient ID: Gina Wise, female   DOB: [birth year], 55 y.o.   MRN: [id number]  HPI  Gina Wise is a 55 y.o. Female with a history of SLE, pulmonary fibrosis, and Sjogren's syndrome who presents with a new spot beneath her left eye and freckle on her left thigh, and complaint of fatigue after starting Imuran.   Right eye Spot: Concerned about small, dark spot beneath her left eye. Says the spot developed after starting Imuran a month ago. She thinks the spot has gotten a little bigger and darker over time. She has not tried treating it with any medication. She says the spot appears through any applied makeup. Denies pain, itchiness, frequent sun exposure, family history of skin cancer, bleeding or discharge, visual changes in left eye, or any bumps or associated skin changes in the area.   Right thigh Freckle: Noticed freckle after starting Cellcept in September of last year. Thinks it has increased in size, has gotten darker, and become irregular in shape. Says it is flat and not raised. Denies freckle being similar to her past SLE skin changes. Denies pain, itchiness, sun exposure, bleeding, family history of skin cancer, discharge, or infection.   Joint paint: Says she has pain in her hands, shoulders, and feet. Had joint pain before but was initially managed with Plaquenil and Cellcept. Pain came back after stopping Cellcept. She has doubled her dose of Plaquenil but the pain persists. Describes pain as dull, 3-4/10 that is worse with activity and better with rest and Flexeril. She takes Flexeril when she has difficulty sleeping due to pain.   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  schogrens- controlled- uses drops for eye   Lupus: No current flair except joint issues  Pulmonary fibrosis: Says  she feels like she doesn't get enough air and sometimes feels dizzy. Says her pulmonologist told her lung funtion tests are fine. Can hear crackling in the morning. Can't sleep laying flat at night and sleeps on 3 pillows. She feels like she's suffocating if she lays flat. Imuran has helped a lot with her cough. Will be seeing her Pulmonologist at [facility], [name], in a few weeks for follow-up.   Additional concerns: Provider Alternative Action Form- can't due 7000 steps in 3 days  Allergies:  -Cellcept- rash -Prednisone- weight gain  Review of Systems Normal other than stated in HPI     Objective:   Physical Exam Filed Vitals:   02/16/16 0925  BP: 130/80  Pulse: 100  Temp: 98.3 F (36.8 C)  Blood pressure 130/80, pulse 100, temperature 98.3 F (36.8 C), temperature source Oral, height 5\' 6"  (1.676 m), weight 184 lb (83.462 kg), last menstrual period 04/03/2012, SpO2 98 %.  Gen: Healthy, conversant, non-distressed, awake and aware. HEENT: MMM, pink conjunctivae Pulm: Crackles at bases bilaterally. No apparent increase work of breathing.  CV: RRR, no murmurs, gallops, or rubs Skin: 1x2 mm uniformly hyperpigmented macule beneath left eye. 5x6 uniformly hyperpigmented, irregular macule located anterolaterally on left thigh. No bleeding or discharge. No erythema or tenderness to touch. No palpable masses.       Assessment:     Gina Wise is a 55 y.o. Female who presents with two new spots beneath her eye and left thigh concerning for Lentigo given physical exam findings, lack of pain or pruritis, and no  family history of skin cancer. Her complaint of fatigue after starting Imuran is concerning for Imuran-associated malaise given that it is a common side effect of Imuran and onset of symptoms after starting Imuran.  Her joint pain appears to related to her autoimmune problems and is currently not controlled on medication.     Plan:     Spot beneath eye:   Lentigo on left thigh:    Joint pain: Recommend follow-up with her Rheumatologist, [name].   Pulmonary Fibrosis: She should follow-up with her Pulmonologist, [name] to talk about her Imuran use and potential alternative medications.   Lupus and Sjogrens: Appear to be well managed currently.    Provider Alternative Action Form: Reduce action plan from 7000 steps every 3 days to 3000 steps.

## 2016-02-17 DIAGNOSIS — L989 Disorder of the skin and subcutaneous tissue, unspecified: Secondary | ICD-10-CM | POA: Insufficient documentation

## 2016-02-17 DIAGNOSIS — L814 Other melanin hyperpigmentation: Secondary | ICD-10-CM | POA: Insufficient documentation

## 2016-02-17 DIAGNOSIS — M329 Systemic lupus erythematosus, unspecified: Secondary | ICD-10-CM | POA: Insufficient documentation

## 2016-02-17 NOTE — Addendum Note (Signed)
Addended by: [name] on: 02/17/2016 09:21 AM   Modules accepted: Orders, Medications

## 2016-02-17 NOTE — Progress Notes (Signed)
Subjective:    Patient ID: Gina Wise, female    DOB: 09-Oct-1961, 55 y.o.   MRN: [id number]  HPI [name] is a 55 y.o. Female with a history of SLE, pulmonary fibrosis, and Sjogren's syndrome who presents with a new spot beneath her left eye and freckle on her left thigh, and complaint of fatigue after starting Imuran.   Left eye Spot: Concerned about small, dark spot beneath her left eye. Says the spot developed after starting Imuran a month ago. She thinks the spot has gotten a little bigger and darker over time. She has not tried treating it with any medication. She says the spot appears through any applied makeup. Denies pain, itchiness, frequent sun exposure, family history of skin cancer, bleeding or discharge, visual changes in left eye, or any bumps or associated skin changes in the area.   Left thigh Freckle: Noticed freckle after starting Cellcept in September of last year. Thinks it has increased in size, has gotten darker, and become irregular in shape. Says it is flat and not raised. Denies freckle being similar to her past SLE skin changes. Denies pain, itchiness, sun exposure, bleeding, family history of skin cancer, discharge, or infection.   Joint paint: Says she has pain in her hands, shoulders, and feet. Had joint pain before but was initially managed with Plaquenil and Cellcept. Pain came back after stopping Cellcept. She has doubled her dose of Plaquenil but the pain persists. Describes pain as dull, 3-4/10 that is worse with activity and better with rest and Flexeril. She takes Flexeril when she has difficulty sleeping due to pain.   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 Seen by [name] (Rheumatology)   Lupus: No current flair except joint issues  Pulmonary fibrosis: Says she feels  like she doesn't get enough air and sometimes feels dizzy. Says her pulmonologist ([name]) told her lung funtion tests are fine. Can hear crackling in the morning. Can't sleep laying flat at night and sleeps on 3 pillows. She feels like she's suffocating if she lays flat. Imuran has helped a lot with her cough. Will be seeing her Pulmonologist at [facility], [name], in a few weeks for follow-up.   Additional concerns: Provider Alternative Action Form- can't due 7000 steps in 3 days  Allergies:  -Cellcept- rash -Prednisone- weight gain   Review of Systems See HPI.     Objective:   Physical Exam Physical Exam Filed Vitals:   02/16/16 0925  BP: 130/80  Pulse: 100  Temp: 98.3 F (36.8 C)  Blood pressure 130/80, pulse 100, temperature 98.3 F (36.8 C), temperature source Oral, height 5\' 6"  (1.676 m), weight 184 lb (83.462 kg), last menstrual period 04/03/2012, SpO2 98 %.  Gen: Healthy, conversant, non-distressed, awake and aware. HEENT: MMM, pink conjunctivae Pulm: Crackles at bases bilaterally. No apparent increase work of breathing. Lungs clear to ausculation otherwise.  CV: RRR, no murmurs, gallops, or rubs Skin: 1x2 mm uniformly hyperpigmented macule beneath left eye. 5x6 uniformly hyperpigmented, irregular macule located anterolaterally on left thigh. No bleeding or discharge. No erythema or tenderness to touch. No palpable masses. (Picture #1 - left thigh, Picture #2 face)            Assessment & Plan:  Lentigo 81mm by 6 mm Lentigo of left thigh. Appears benign. Recommend continued observation. If it continues  to increase in size, recommend skin biopsy.  Skin lesion 1 mm by 2 mm freckle under left eye. Appears benign. Currently do not recommend biopsy or treatment. Continue to observe for change.   Lung disease with systemic lupus erythematosus ([place]) Stable with continued respiratory complaints. She should follow-up with her Pulmonologist, [name] to talk about  her Imuran use and potential alternative medications.  Lupus (systemic lupus erythematosus) ([place]) Patient having joint pain related to SLE. Recently started on Imuran. Compliant with Plaquenil. Not able to tolerate Cellcept due to rash. I will contact her rheumatologist [name] for recommendations. Patient has upcoming follow up.

## 2016-02-17 NOTE — Assessment & Plan Note (Signed)
Stable with continued respiratory complaints. She should follow-up with her Pulmonologist, [name] to talk about her Imuran use and potential alternative medications.

## 2016-02-17 NOTE — Assessment & Plan Note (Signed)
63mm by 6 mm Lentigo of left thigh. Appears benign. Recommend continued observation. If it continues to increase in size, recommend skin biopsy.

## 2016-02-17 NOTE — Assessment & Plan Note (Signed)
Patient having joint pain related to SLE. Recently started on Imuran. Compliant with Plaquenil. Not able to tolerate Cellcept due to rash. I will contact her rheumatologist [name] for recommendations. Patient has upcoming follow up.

## 2016-02-17 NOTE — Assessment & Plan Note (Signed)
1 mm by 2 mm freckle under left eye. Appears benign. Currently do not recommend biopsy or treatment. Continue to observe for change.

## 2016-03-07 ENCOUNTER — Other Ambulatory Visit: Payer: Self-pay | Admitting: Family Medicine

## 2016-03-08 ENCOUNTER — Other Ambulatory Visit: Payer: Self-pay | Admitting: *Deleted

## 2016-03-08 DIAGNOSIS — G43811 Other migraine, intractable, with status migrainosus: Secondary | ICD-10-CM

## 2016-03-08 MED ORDER — PROMETHAZINE HCL 12.5 MG PO TABS: 12.5000 mg | ORAL_TABLET | Freq: Three times a day (TID) | ORAL | Status: DC | PRN

## 2016-03-08 NOTE — Telephone Encounter (Signed)
RN staff - please call in Ambien 5 mg QHS prn insomnia, dispense #30, refill #0, thanks

## 2016-03-09 NOTE — Telephone Encounter (Signed)
Rx called in to pharmacy. 

## 2016-03-13 ENCOUNTER — Other Ambulatory Visit: Payer: Self-pay | Admitting: *Deleted

## 2016-03-13 MED ORDER — ZOLPIDEM TARTRATE 5 MG PO TABS: 5.0000 mg | ORAL_TABLET | Freq: Every evening | ORAL | Status: DC | PRN

## 2016-03-13 MED ORDER — HYDROCHLOROTHIAZIDE 25 MG PO TABS: 25.0000 mg | ORAL_TABLET | Freq: Every day | ORAL | Status: DC

## 2016-03-13 NOTE — Telephone Encounter (Signed)
Called into pharmacy. Coreon Simkins, CMA  

## 2016-03-13 NOTE — Telephone Encounter (Signed)
HCTZ and zolpidem were approved for refill at [place] on 03/08/16 Received faxed request from [facility] today for these meds. Velora Heckler, RN

## 2016-03-13 NOTE — Telephone Encounter (Signed)
RN staff - please call in Ambien 5 mg QHS prn insomnia to [facility], dispense #30, refill #0, thanks

## 2016-04-04 ENCOUNTER — Other Ambulatory Visit: Payer: Self-pay | Admitting: *Deleted

## 2016-04-08 ENCOUNTER — Encounter: Payer: Self-pay | Admitting: Family Medicine

## 2016-04-09 ENCOUNTER — Other Ambulatory Visit: Payer: Self-pay | Admitting: *Deleted

## 2016-04-19 ENCOUNTER — Other Ambulatory Visit: Payer: Self-pay | Admitting: Family Medicine

## 2016-04-19 MED ORDER — LEVOTHYROXINE SODIUM 112 MCG PO TABS: 112.0000 ug | ORAL_TABLET | Freq: Every day | ORAL | Status: DC

## 2016-04-23 ENCOUNTER — Encounter: Payer: Self-pay | Admitting: Family Medicine

## 2016-04-23 NOTE — Progress Notes (Signed)
Reviewed Rheumatology note (Dr. Cleatis Polka) from 04/11/16.  Updated Plaquenil and Imuran doses in EPIC.

## 2016-05-02 ENCOUNTER — Telehealth: Payer: Self-pay | Admitting: *Deleted

## 2016-05-02 NOTE — Telephone Encounter (Signed)
Patient will need to be seen in office and evaluated. Please schedule same day appointment.

## 2016-05-02 NOTE — Telephone Encounter (Signed)
Pt called to check and see if the doctor is going to send in something for her pulmonary issue. She said that it is getting really bad and is afraid that it could get worse. Please call patient and let her know what the status is. Gina Wise

## 2016-05-02 NOTE — Telephone Encounter (Signed)
Patient states that she has had a cold for the past week or so and it is starting to settle into her chest. States she has pulmonary fibrosis and would like to have something called in. Offered an appointment with resident as Dr. Ree Kida has no upcoming appointments but patient states 'Dr. Ree Kida knows my history'. Informed patient I would send message to PCP.

## 2016-05-03 ENCOUNTER — Ambulatory Visit (INDEPENDENT_AMBULATORY_CARE_PROVIDER_SITE_OTHER): Payer: 59 | Admitting: Internal Medicine

## 2016-05-03 ENCOUNTER — Encounter: Payer: Self-pay | Admitting: Internal Medicine

## 2016-05-03 VITALS — BP 134/72 | HR 96 | Temp 97.9°F | Ht 66.0 in | Wt 194.6 lb

## 2016-05-03 DIAGNOSIS — J019 Acute sinusitis, unspecified: Secondary | ICD-10-CM

## 2016-05-03 DIAGNOSIS — J329 Chronic sinusitis, unspecified: Secondary | ICD-10-CM | POA: Insufficient documentation

## 2016-05-03 DIAGNOSIS — J0120 Acute ethmoidal sinusitis, unspecified: Secondary | ICD-10-CM | POA: Diagnosis not present

## 2016-05-03 MED ORDER — AMOXICILLIN-POT CLAVULANATE 875-125 MG PO TABS: 1.0000 | ORAL_TABLET | Freq: Two times a day (BID) | ORAL | Status: DC

## 2016-05-03 NOTE — Patient Instructions (Signed)
Take Augmentin twice per day for 7 days.   Get plenty of rest over this weekend. I would recommend nasal saline spray for congestion as well. This should not irritate your nose and cause nose bleeds like medications might.   Please return if you have severe SOB, your cough worsens despite antibiotics, or you develop fevers.   Feel better,   Dr. Juleen China

## 2016-05-03 NOTE — Telephone Encounter (Signed)
LMOVM for pt to call us back. Deseree Blount, CMA  

## 2016-05-03 NOTE — Assessment & Plan Note (Addendum)
Given prolonged history of symptoms and exam findings, will treat for acute bacterial sinusitis. Suspect cough related to postnasal drip as lung exam fairly benign (upon chart review and discussion with patient mild crackles are her baseline). However, patient is at worsened risk for respiratory complications/infections given her history and use of immunosuppressive medications.  -Augmentin for 7 days  -patient declined flonase/afrin given h/o nosebleeds, suggested nasal saline spray  -supportive care measures discussed -strict respiratory return precautions given

## 2016-05-03 NOTE — Addendum Note (Signed)
Addended by: Melina Schools on: 05/03/2016 05:26 PM   Modules accepted: Miquel Dunn

## 2016-05-03 NOTE — Telephone Encounter (Signed)
Patient called back.  She already has an appt today with Dr. Juleen China. Jazmin Hartsell,CMA

## 2016-05-03 NOTE — Progress Notes (Signed)
Subjective:    Gina Wise - 55 y.o. female MRN [id number]  Date of birth: 02-09-1961  HPI  Gina Wise is 55 y.o. female with PMH of SLE with pulmonary fibrosis here for SDA for cough/congestion.  Cough/Congestion:  Congestion started 3 weeks ago. Has history of pulmonary fibrosis and usually has a dry cough but last week cough changed. Now sounds more wheezy and is productive. Reports that her head and face hurt. Has sensation of ears being clogged up. Has SOB at baseline but it feels worse. Reports fatigue. Denies fevers at home.  Has lots of nasal congestion. People at work have been sick around her. Says "everybody" has been a sick contact.   Health Maintenance Due  Topic Date Due  . Hepatitis C Screening  1961/03/16  . MAMMOGRAM  02/27/2012  . PAP SMEAR  01/02/2014    -  reports that she has never smoked. She has never used smokeless tobacco. - Review of Systems: Per HPI. - Past Medical History: Patient Active Problem List   Diagnosis Date Noted  . Sinusitis 05/03/2016  . Lentigo 02/17/2016  . Skin lesion 02/17/2016  . Lupus (systemic lupus erythematosus) ([place]) 02/17/2016  . Upper airway cough syndrome 12/16/2015  . Epistaxis 12/01/2015  . Interstitial lung disease due to connective tissue disease ([place]) 11/07/2015  . Frontal sinus pain 11/01/2015  . Hair loss 08/19/2015  . CAP (community acquired pneumonia) 08/03/2015  . Shortness of breath   . Lupus ([place])   . Sjogren's disease ([place])   . Essential hypertension   . Other specified hypothyroidism   . Lung disease with systemic lupus erythematosus ([place]) 07/14/2015  . Rash and nonspecific skin eruption 05/20/2015  . Bruising 05/10/2015  . Pain in joint, multiple sites 05/10/2015  . Shingles 02/16/2015  . 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 Allergic conjunctivitis 12/13/2014  . Throat clearing 08/10/2014  . Hyperlipidemia 08/10/2014  . Medication side effect 03/30/2014  . Gastroparesis 03/11/2014  . Schatzki's ring  03/09/2014  . Unspecified constipation 02/23/2014  . GERD (gastroesophageal reflux disease) 02/23/2014  . Goiter 07/03/2013  . Cervical lymphadenopathy 06/15/2013  . Depression with anxiety 06/15/2013  . Headache(784.0) 10/24/2012  . Iron deficiency 03/13/2012  . Fatigue 03/12/2012  . Dysfunctional uterine bleeding 01/02/2012  . Insomnia 10/03/2011  . Dyslipidemia 09/03/2011  . Neck pain, chronic 09/03/2011  . Overweight 01/03/2011  . 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 Migraine 10/04/2009  . Hypothyroidism 09/09/2009  . Essential hypertension, benign 09/09/2009   - Medications: reviewed and updated    Objective:   Physical Exam BP 134/72 mmHg  Pulse 96  Temp(Src) 97.9 F (36.6 C) (Oral)  Ht 5\' 6"  (1.676 m)  Wt 194 lb 9.6 oz (88.27 kg)  BMI 31.42 kg/m2  LMP 04/03/2012 Gen: NAD, alert, cooperative with exam, appears ill but non-toxic  HEENT: NCAT, PERRL, oropharynx erythematous with post-nasal drip, no tonsillar cobblestoning, supple neck without LAD, TTP over maxillary and frontal sinuses bilaterally, cerumen in ear canals bilaterally but TMs appeared grossly normal  CV: RRR, good S1/S2, no murmur Resp: mild crackles at bases bilaterally but otherwise clear, moving air well in all fields, no increased WOB      Assessment & Plan:   Sinusitis Given prolonged history of symptoms and exam findings, will treat for acute bacterial sinusitis. Suspect cough related to postnasal drip. However, patient is at worsened risk for respiratory complications/infections given her history and use of immunosuppressive medications.  -Augmentin for 7 days  -patient  declined flonase/afrin given h/o nosebleeds, suggested nasal saline spray  -supportive care measures discussed -strict respiratory return precautions given      Gina Wise, D.O. 05/03/2016, 5:18 PM PGY-2, [place]

## 2016-05-11 ENCOUNTER — Telehealth: Payer: Self-pay | Admitting: Family Medicine

## 2016-05-11 NOTE — Telephone Encounter (Signed)
Patient requesting refill on Augmentin, will forward to Dr. Juleen China and PCP

## 2016-05-11 NOTE — Telephone Encounter (Signed)
I will defer to Dr. Juleen China as she saw and evaluated the patient.

## 2016-05-11 NOTE — Telephone Encounter (Signed)
Pt called because she was seen last week by Dr. Juleen China and was prescribed some medication. She said that the medication helped and she felt better, but as soon as she stopped taking the medication she started getting sick again. She is wanting to know can the doctor refill this or call in something that is different. jw

## 2016-05-14 NOTE — Telephone Encounter (Signed)
If patient began to feel sick again despite completing course of Augmentin she needs to be seen again. Please call patient to schedule an appointment.   Phill Myron, D.O. 05/14/2016, 9:13 AM PGY-2, [place]

## 2016-05-15 NOTE — Telephone Encounter (Signed)
LVM to call office to inform pt of below and see if she wanted to schedule an appointment. [name], [name], [name]

## 2016-05-16 ENCOUNTER — Telehealth: Payer: Self-pay

## 2016-05-16 NOTE — Telephone Encounter (Signed)
D- Patient called to ask if we had any support group for Pulmonary fibrosis. Patient stated that was by herself and her lung doctor was at [facility]. Explained that I would take her name and get back to her with information. [facility] called. The lady that answered stated that Pulmonary would be back tomorrow and there was a group with Marlane Mingle a volunteer. Threasa Beards stated that would call me back tomorrow with information.  Attempted to call patient back and left message about what I had found out.  A- Resource need/ Depression?  R- Will return the call once more today and again after Mr. Hildred Alamin calls.

## 2016-05-18 NOTE — Telephone Encounter (Signed)
LM for pt to call back to see if she was better or if she wanted to schedule an appointment. [name], Rayann Jolley D, [name]

## 2016-05-23 ENCOUNTER — Ambulatory Visit (INDEPENDENT_AMBULATORY_CARE_PROVIDER_SITE_OTHER): Payer: 59 | Admitting: Family Medicine

## 2016-05-23 ENCOUNTER — Other Ambulatory Visit (HOSPITAL_COMMUNITY): Payer: 59

## 2016-05-23 ENCOUNTER — Ambulatory Visit (HOSPITAL_COMMUNITY)
Admission: RE | Admit: 2016-05-23 | Discharge: 2016-05-23 | Disposition: A | Discharge status: Home / Self Care | Payer: 59 | Source: Ambulatory Visit | Attending: Family Medicine | Admitting: Family Medicine

## 2016-05-23 ENCOUNTER — Encounter: Payer: Self-pay | Admitting: Family Medicine

## 2016-05-23 VITALS — BP 145/89 | HR 98 | Temp 98.3°F | Wt 194.6 lb

## 2016-05-23 DIAGNOSIS — R079 Chest pain, unspecified: Secondary | ICD-10-CM | POA: Insufficient documentation

## 2016-05-23 DIAGNOSIS — I700 Atherosclerosis of aorta: Secondary | ICD-10-CM | POA: Insufficient documentation

## 2016-05-23 DIAGNOSIS — R9431 Abnormal electrocardiogram [ECG] [EKG]: Secondary | ICD-10-CM | POA: Insufficient documentation

## 2016-05-23 DIAGNOSIS — R05 Cough: Secondary | ICD-10-CM | POA: Diagnosis not present

## 2016-05-23 DIAGNOSIS — R0600 Dyspnea, unspecified: Secondary | ICD-10-CM | POA: Diagnosis not present

## 2016-05-23 DIAGNOSIS — R059 Cough, unspecified: Secondary | ICD-10-CM

## 2016-05-23 MED ORDER — ALBUTEROL SULFATE HFA 108 (90 BASE) MCG/ACT IN AERS: 2.0000 | INHALATION_SPRAY | Freq: Four times a day (QID) | RESPIRATORY_TRACT | 0 refills | Status: DC | PRN

## 2016-05-23 MED ORDER — PREDNISONE 50 MG PO TABS: 50.0000 mg | ORAL_TABLET | Freq: Every day | ORAL | 0 refills | Status: DC

## 2016-05-23 MED ORDER — AZITHROMYCIN 250 MG PO TABS: ORAL_TABLET | ORAL | 0 refills | Status: DC

## 2016-05-23 NOTE — Progress Notes (Signed)
HPI  CC: COUGH Started 3 weeks ago. Seen here for similar symptoms. Given Augmentin, had some improvement but never quite got to 100%. Still coughing. Having fatigue and joint pain. Overall malaise. Did not get flu vaccine this year. Recently diagnosed with lupus, Sjogren's syndrome, and pulmonary fibrosis.  Cough is: no Sputum production: had been present but not presently Medications tried: augmentin, cough med w/ codeine.  Taking blood pressure medications: HCTZ  Symptoms Runny nose: no Mucous in back of throat: yes Throat burning or reflux: no Wheezing or asthma: no Fever: no Chest Pain: yes; described at pressure-like/heavy, not worse w/ coughing Shortness of breath: yes Leg swelling: no Hemoptysis: no Weight loss: no  ROS see HPI Smoking Status noted  CC, SH/smoking status, and VS noted  Objective: BP (!) 145/89 (BP Location: Left Arm, Patient Position: Sitting, Cuff Size: Normal)   Pulse 98   Temp 98.3 F (36.8 C) (Oral)   Wt 194 lb 9.6 oz (88.3 kg)   LMP 04/03/2012   SpO2 98%   BMI 31.41 kg/m  Gen: NAD, alert, cooperative, and uncomfortable appearing HEENT: NCAT, EOMI, PERRL, MMM, no LAD, OP clear, some tenderness noted in the left proximal base of the neck. CV: RRR, no murmur, no rubs Resp: CTAB, no wheezes, non-labored, coughing intermittently Ext: No edema, warm Neuro: Alert and oriented, Speech clear, No gross deficits  Assessment and plan:  Cough Patient is here with persistent cough currently of unknown etiology. Obvious concerns for worsening pulmonary fibrosis versus pneumonia. Would also strongly consider pertussis within the differential. Albuterol nebulizer treatment provided in clinic, yielded increased coughing with new sputum production. Will treat for atypicals including pertussis. - Azithromycin 500 mg once, 250 mg daily for 4 days beginning tomorrow. - Patient received steroid injection at rheumatology appointment earlier today. I've  written for 5 days of prednisone in case patient's symptoms required additional steroid - Albuterol inhaler provided today. Informed patient that she is to take this every 6 hours for the next 2 days then as needed. - Chest x-ray. - Patient is to follow-up on Friday of this week for reassessment.  Pain in the chest Patient is complaining of generalized chest pain. Pain is not worsened with coughing. Pain is described as heavy and pressure-like. Initial concern for cardiac etiology however EKG was unremarkable and patient has very little history to cause suspicion for ACS. - Monitor, as we are currently treating conservatively. - Next: Consider echocardiogram to rule out lupus endocarditis if symptoms change or chest x-ray yields a increased cardiac silhouette.   Orders Placed This Encounter  Procedures  . DG Chest 2 View    Standing Status:   Future    Number of Occurrences:   1    Standing Expiration Date:   07/23/2017    Order Specific Question:   Reason for Exam (SYMPTOM  OR DIAGNOSIS REQUIRED)    Answer:   Dyspnea    Order Specific Question:   Is the patient pregnant?    Answer:   No    Order Specific Question:   Preferred imaging location?    Answer:   [facility]  . EKG 12-Lead    Ordered by an unspecified provider   . EKG 12-Lead    Ordered by an unspecified provider     Meds ordered this encounter  Medications  . azithromycin (ZITHROMAX) 250 MG tablet    Sig: Take 2 tablets today. Beginning tomorrow take 1 tablet daily until the prescription is empty.  Dispense:  6 each    Refill:  0  . predniSONE (DELTASONE) 50 MG tablet    Sig: Take 1 tablet (50 mg total) by mouth daily with breakfast.    Dispense:  5 tablet    Refill:  0  . albuterol (PROVENTIL HFA;VENTOLIN HFA) 108 (90 Base) MCG/ACT inhaler    Sig: Inhale 2 puffs into the lungs every 6 (six) hours as needed for wheezing or shortness of breath.    Dispense:  1 Inhaler    Refill:  0     Elberta Leatherwood,  MD,MS,  PGY3 05/23/2016 6:55 PM

## 2016-05-23 NOTE — Patient Instructions (Signed)
It was a pleasure seeing you today in our clinic. Today we discussed your cough. Here is the treatment plan we have discussed and agreed upon together:   - I've prescribed you azithromycin. Take 2 tablets today and then 1 tablet every day after. - I have also prescribed you albuterol. Take this as described on the prescription for wheezing. - I've also prescribed you prednisone. Take 1 tablet every day for the next 5 days. - I have ordered to have a chest x-ray taken. Please have this done at [facility]. He would twice a day all to have this obtained prior to your visit on Friday. - Please follow up with [place] on Friday. You may want to cancel this appointment if your symptoms dramatically improved.

## 2016-05-23 NOTE — Assessment & Plan Note (Addendum)
Patient is here with persistent cough currently of unknown etiology. Obvious concerns for worsening pulmonary fibrosis versus pneumonia. Would also strongly consider pertussis within the differential. Albuterol nebulizer treatment provided in clinic, yielded increased coughing with new sputum production. Will treat for atypicals including pertussis. - Azithromycin 500 mg once, 250 mg daily for 4 days beginning tomorrow. - Patient received steroid injection at rheumatology appointment earlier today. I've written for 5 days of prednisone in case patient's symptoms required additional steroid - Albuterol inhaler provided today. Informed patient that she is to take this every 6 hours for the next 2 days then as needed. - Chest x-ray. - Patient is to follow-up on Friday of this week for reassessment.

## 2016-05-23 NOTE — Assessment & Plan Note (Addendum)
Patient is complaining of generalized chest pain. Pain is not worsened with coughing. Pain is described as heavy and pressure-like. Initial concern for cardiac etiology however EKG was unremarkable and patient has very little history to cause suspicion for ACS. - Monitor, as we are currently treating conservatively. - Next: Consider echocardiogram to rule out lupus endocarditis if symptoms change or chest x-ray yields a increased cardiac silhouette.

## 2016-05-24 NOTE — Telephone Encounter (Signed)
Pt has appointment scheduled on 05/25/16. [name], [name], [name]

## 2016-05-25 ENCOUNTER — Ambulatory Visit: Payer: 59 | Admitting: Internal Medicine

## 2016-06-01 ENCOUNTER — Encounter: Payer: Self-pay | Admitting: Family Medicine

## 2016-06-05 ENCOUNTER — Other Ambulatory Visit: Payer: Self-pay | Admitting: Family Medicine

## 2016-06-05 DIAGNOSIS — M3213 Lung involvement in systemic lupus erythematosus: Secondary | ICD-10-CM

## 2016-06-05 NOTE — Progress Notes (Signed)
Patient requested referral to pulm rehab. Order placed in EPIC.

## 2016-06-19 ENCOUNTER — Telehealth (HOSPITAL_COMMUNITY): Payer: Self-pay

## 2016-06-28 ENCOUNTER — Other Ambulatory Visit: Payer: Self-pay | Admitting: Family Medicine

## 2016-07-02 ENCOUNTER — Encounter: Payer: Self-pay | Admitting: Family Medicine

## 2016-07-02 ENCOUNTER — Encounter (HOSPITAL_COMMUNITY): Payer: Self-pay

## 2016-07-02 ENCOUNTER — Encounter (HOSPITAL_COMMUNITY)
Admission: RE | Admit: 2016-07-02 | Discharge: 2016-07-02 | Disposition: A | Discharge status: Home / Self Care | Payer: 59 | Source: Ambulatory Visit | Attending: Family Medicine | Admitting: Family Medicine

## 2016-07-02 VITALS — BP 141/88 | HR 97 | Resp 18 | Ht 65.5 in | Wt 197.8 lb

## 2016-07-02 DIAGNOSIS — J189 Pneumonia, unspecified organism: Secondary | ICD-10-CM | POA: Diagnosis present

## 2016-07-02 DIAGNOSIS — M3213 Lung involvement in systemic lupus erythematosus: Secondary | ICD-10-CM

## 2016-07-02 NOTE — Progress Notes (Signed)
Gina Wise 55 y.o. female Pulmonary Rehab Orientation Note Patient arrived today in Cardiac and Pulmonary Rehab for orientation to Pulmonary Rehab. She was ambulated from [employer]. She has not been prescribed oxygen for home use. Color good, skin warm and dry. Patient is oriented to time and place. Patient's medical history, psychosocial health, and medications reviewed. Psychosocial assessment reveals pt lives alone. She has been divorced for 17 years. Her mother lived with her the last 68 years of her life. She raised her boys alone and now one lives in [place] and the other in another state. She does not see them often. Pt is currently employed full time at [employer]. Pt hobbies include reading, working on her truck, and walking her dog. Pt reports her stress level is moderate. Areas of stress/anxiety include her health and finances. Pt does exhibit signs of depression. Signs of depression include crying spells, irritability and sadness and difficulty falling asleep and difficulty maintaining sleep. PHQ2/9 score 6/21. She has been prescribed zoloft 100mg  but says she only takes 50mg . She was having to pay cash for the drug and easier to pay for 100mg  and cut them in half. She was offered access to spiritual care. She states she also has a therapist, but has not seen her for some time. She feels she knows all the right things to do and feels seeking additional psychosocial services will not be beneficial.  Pt shows fair coping skills with questionable outlook. She states she has no support system. She hopes she will find meaningful relationships thru pulmonary rehab and the pulmonary fibrosis support group. She was offered emotional support and reassurance. Will continue to monitor and evaluate progress toward psychosocial goal(s) of improving her phq 2/9 scores.Physical assessment reveals heart rate is tachycardic. Breath sounds clear to auscultation, no wheezes, rales, or rhonchi. Grip strength equal,  strong. Distal pulses palpable. Patient reports she does take medications as prescribed. Patient states she follows a Low Sodium diet. The patient reports no specific efforts to gain or lose weight. She has gained a significant amt of weight while on prednisone. She hopes now that she is no longer prescribe the steroid that she can successfully begin to loose weight. Patient's weight will be monitored closely. Demonstration and practice of PLB using pulse oximeter. Patient able to return demonstration satisfactorily. Safety and hand hygiene in the exercise area reviewed with patient. Patient voices understanding of the information reviewed. Department expectations discussed with patient and achievable goals were set. The patient shows enthusiasm about attending the program and we look forward to working with this nice lady. The patient is scheduled for a 6 min walk test on Tuesday 9/12 and to begin exercise on Tuesday 9/19 in the 1:30 class.   45 minutes was spent on a variety of activities such as assessment of the patient, obtaining baseline data including height, weight, BMI, and grip strength, verifying medical history, allergies, and current medications, and teaching patient strategies for performing tasks with less respiratory effort with emphasis on pursed lip breathing.

## 2016-07-03 ENCOUNTER — Encounter (HOSPITAL_COMMUNITY)
Admission: RE | Admit: 2016-07-03 | Discharge: 2016-07-03 | Disposition: A | Discharge status: Home / Self Care | Payer: 59 | Source: Ambulatory Visit | Attending: Family Medicine | Admitting: Family Medicine

## 2016-07-03 DIAGNOSIS — J189 Pneumonia, unspecified organism: Secondary | ICD-10-CM | POA: Diagnosis not present

## 2016-07-03 DIAGNOSIS — M3213 Lung involvement in systemic lupus erythematosus: Secondary | ICD-10-CM

## 2016-07-03 NOTE — Progress Notes (Signed)
Pulmonary Individual Treatment Plan  Patient Details  Name: Gina Wise MRN: [id number] Date of Birth: [birth year] Referring Provider:   April Manson Pulmonary Rehab Walk Test from 07/03/2016 in [facility]  Referring Provider  Dr. Ree Kida      Initial Encounter Date:  Flowsheet Row Pulmonary Rehab Walk Test from 07/03/2016 in [facility]  Date  07/03/16  Referring Provider  Dr. Ree Kida      Visit Diagnosis: No diagnosis found.  Patient's Home Medications on Admission:   Current Outpatient Prescriptions:  .  albuterol (PROVENTIL HFA;VENTOLIN HFA) 108 (90 Base) MCG/ACT inhaler, Inhale 2 puffs into the lungs every 6 (six) hours as needed for wheezing or shortness of breath., Disp: 1 Inhaler, Rfl: 0 .  azaTHIOprine (IMURAN) 50 MG tablet, Take 3 tablets by mouth daily. , Disp: , Rfl:  .  b complex vitamins capsule, Take 1 capsule by mouth daily., Disp: , Rfl:  .  chlorpheniramine-HYDROcodone (TUSSIONEX) 10-8 MG/5ML SUER, Take 5 mLs by mouth every 12 (twelve) hours as needed for cough., Disp: 473 mL, Rfl: 0 .  cyclobenzaprine (FLEXERIL) 10 MG tablet, , Disp: , Rfl:  .  esomeprazole (NEXIUM) 40 MG capsule, Take 1 capsule (40 mg total) by mouth 2 (two) times daily before a meal., Disp: 60 capsule, Rfl: 11 .  fexofenadine (ALLEGRA) 180 MG tablet, Take 180 mg by mouth daily., Disp: , Rfl:  .  hydrochlorothiazide (HYDRODIURIL) 25 MG tablet, Take 1 tablet (25 mg total) by mouth daily., Disp: 90 tablet, Rfl: 1 .  hydroxychloroquine (PLAQUENIL) 200 MG tablet, Take 200 mg by mouth 2 (two) times daily. , Disp: , Rfl:  .  levothyroxine (SYNTHROID, LEVOTHROID) 112 MCG tablet, Take 1 tablet (112 mcg total) by mouth daily before breakfast., Disp: 90 tablet, Rfl: 1 .  Multiple Vitamins-Minerals (BODY/HAIR/SKIN/NAILS) CAPS, Take 1 capsule by mouth daily., Disp: , Rfl:  .  Polyvinyl Alcohol-Povidone (REFRESH OP), Apply 1 drop to eye daily as needed  (dry eyes)., Disp: , Rfl:  .  promethazine (PHENERGAN) 12.5 MG tablet, Take 1 tablet (12.5 mg total) by mouth every 8 (eight) hours as needed for nausea or vomiting., Disp: 60 tablet, Rfl: 0 .  sertraline (ZOLOFT) 100 MG tablet, Take one-half tablet by  mouth daily, Disp: 45 tablet, Rfl: 1 .  SUMAtriptan (IMITREX) 100 MG tablet, Take 1 tablet (100 mg total) by mouth every 2 (two) hours as needed for migraine., Disp: 20 tablet, Rfl: 1 .  Vitamin D, Cholecalciferol, 400 UNITS TABS, Take 400 Units by mouth daily., Disp: , Rfl:  .  zolpidem (AMBIEN) 5 MG tablet, Take 1 tablet (5 mg total) by mouth at bedtime as needed for sleep., Disp: 30 tablet, Rfl: 0 No current facility-administered medications for this encounter.   Facility-Administered Medications Ordered in Other Encounters:  .  0.9 %  sodium chloride infusion, , Intravenous, Continuous, [name], MD, Last Rate: 150 mL/hr at 02/16/14 1213  Past Medical History: Past Medical History:  Diagnosis Date  . Allergy   . Anemia   . Chronic bronchitis ([place])    "get it q yr; in the spring time" (02/17/2014)  . Depression    "have taken zoloft since 1996" (02/17/2014)  . Diverticulosis   . Gastritis   . GERD (gastroesophageal reflux disease)   . Grover's disease   . High cholesterol   . Hypertension   . Hypothyroid   . Migraines    "maybe a couple/yr" (02/17/2014)  .  Multiple environmental allergies   . Overweight(278.02)   . Peri-menopause   . Schatzki's ring   . Sjogren's disease ([place])   . Systemic lupus ([facility])     Tobacco Use: History  Smoking Status  . Never Smoker  Smokeless Tobacco  . Never Used    Labs: Recent Review Flowsheet Data    Labs for ITP Cardiac and Pulmonary Rehab Latest Ref Rng & Units 09/03/2011 02/28/2014 08/10/2014   Cholestrol 0 - 200 mg/dL - - 292(A)   LDLCALC mg/dL - - 205   LDLDIRECT mg/dL 144(H) - -   HDL 35 - 70 mg/dL - - 56   Trlycerides 40 - 160 mg/dL - - 157   Hemoglobin A1c - - 5.8(H) 5.4       Capillary Blood Glucose: Lab Results  Component Value Date   GLUCAP 94 02/28/2014     ADL UCSD:   Pulmonary Function Assessment:     Pulmonary Function Assessment - 07/02/16 1048      Breath   Bilateral Breath Sounds Clear   Shortness of Breath Yes;Limiting activity;Fear of Shortness of Breath      Exercise Target Goals: Date: 07/03/16  Exercise Program Goal: Individual exercise prescription set with THRR, safety & activity barriers. Participant demonstrates ability to understand and report RPE using BORG scale, to self-measure pulse accurately, and to acknowledge the importance of the exercise prescription.  Exercise Prescription Goal: Starting with aerobic activity 30 plus minutes a day, 3 days per week for initial exercise prescription. Provide home exercise prescription and guidelines that participant acknowledges understanding prior to discharge.  Activity Barriers & Risk Stratification:     Activity Barriers & Cardiac Risk Stratification - 07/02/16 1047      Activity Barriers & Cardiac Risk Stratification   Activity Barriers Shortness of Breath;Deconditioning;Balance Concerns;Joint Problems      6 Minute Walk:     6 Minute Walk    Row Name 07/03/16 1631         6 Minute Walk   Phase Initial     Distance 1800 feet     Walk Time 6 minutes     # of Rest Breaks 0     MPH 3.4     METS 3.6     RPE 13     Perceived Dyspnea  3     Symptoms Yes (comment)  dizzy and feet pain     Resting HR 134 bpm     Resting BP 143/79     Max Ex. HR 134 bpm     Max Ex. BP 141/84     2 Minute Post BP 132/86       Interval HR   Baseline HR 103     1 Minute HR 112     2 Minute HR 122     3 Minute HR 127     4 Minute HR 132     5 Minute HR 132     6 Minute HR 134     2 Minute Post HR 125     Interval Heart Rate? Yes       Interval Oxygen   Interval Oxygen? Yes     Baseline Oxygen Saturation % 99 %     Baseline Liters of Oxygen 0 L     1 Minute Oxygen  Saturation % 99 %     1 Minute Liters of Oxygen 0 L     2 Minute Oxygen Saturation % 100 %  2 Minute Liters of Oxygen 0 L     3 Minute Oxygen Saturation % 98 %     3 Minute Liters of Oxygen 0 L     4 Minute Oxygen Saturation % 97 %     4 Minute Liters of Oxygen 0 L     5 Minute Oxygen Saturation % 97 %     5 Minute Liters of Oxygen 0 L     6 Minute Oxygen Saturation % 97 %     6 Minute Liters of Oxygen 0 L     2 Minute Post Oxygen Saturation % 97 %     2 Minute Post Liters of Oxygen 0 L        Initial Exercise Prescription:     Initial Exercise Prescription - 07/03/16 1600      Date of Initial Exercise RX and Referring Provider   Date 07/03/16   Referring Provider Dr. Ree Kida     NuStep   Level 2   Minutes 17   METs 1.4     Arm Ergometer   Level 2   Minutes 17     Track   Laps 8   Minutes 17     Prescription Details   Frequency (times per week) 2   Duration Progress to 45 minutes of aerobic exercise without signs/symptoms of physical distress     Intensity   THRR 40-80% of Max Heartrate 66-133   Ratings of Perceived Exertion 11-13   Perceived Dyspnea 0-4     Progression   Progression Continue progressive overload as per policy without signs/symptoms or physical distress.     Resistance Training   Training Prescription Yes   Weight orange bands   Reps 10-12      Perform Capillary Blood Glucose checks as needed.  Exercise Prescription Changes:   Exercise Comments:   Discharge Exercise Prescription (Final Exercise Prescription Changes):    Nutrition:  Target Goals: Understanding of nutrition guidelines, daily intake of sodium 1500mg , cholesterol 200mg , calories 30% from fat and 7% or less from saturated fats, daily to have 5 or more servings of fruits and vegetables.  Biometrics:     Pre Biometrics - 07/02/16 1117      Pre Biometrics   Grip Strength 15 kg       Nutrition Therapy Plan and Nutrition Goals:   Nutrition Discharge:  Rate Your Plate Scores:   Psychosocial: Target Goals: Acknowledge presence or absence of depression, maximize coping skills, provide positive support system. Participant is able to verbalize types and ability to use techniques and skills needed for reducing stress and depression.  Initial Review & Psychosocial Screening:     Initial Psych Review & Screening - 07/02/16 1049      Initial Review   Current issues with Current Depression;History of Depression;Current Sleep Concerns;Current Stress Concerns   Source of Stress Concerns Chronic Illness;Unable to participate in former interests or hobbies     [place]? No   Concerns No support system   Comments divorced, children do not live here. No friends     Barriers   Psychosocial barriers to participate in program Psychosocial barriers identified (see note)     Screening Interventions   Interventions Encouraged to exercise;Other (comment)   Comments patient feels she does not need therapist at this time. She has seen therapist in the past. Understands diagnosis of depression. takes medications as prescribed. Hopeful that she will meet people in pulmonary rehab that  may be a sorce of support. Has also joined PF support group.      Quality of Life Scores:   PHQ-9: Recent Review Flowsheet Data    Depression screen [facility] 2/9 07/02/2016 05/03/2016 02/16/2016 12/01/2015 10/07/2015   Decreased Interest 3 0 0 0 0   Down, Depressed, Hopeless 3 0 0 0 0   PHQ - 2 Score 6 0 0 0 0   Altered sleeping 3 - - - -   Tired, decreased energy 3 - - - -   Change in appetite 3 - - - -   Feeling bad or failure about yourself  3 - - - -   Trouble concentrating 3 - - - -   Moving slowly or fidgety/restless 0 - - - -   Suicidal thoughts 0 - - - -   PHQ-9 Score 21 - - - -   Difficult doing work/chores Very difficult - - - -      Psychosocial Evaluation and Intervention:     Psychosocial Evaluation - 07/02/16 1053       Psychosocial Evaluation & Interventions   Interventions Encouraged to exercise with the program and follow exercise prescription      Psychosocial Re-Evaluation:  Education: Education Goals: Education classes will be provided on a weekly basis, covering required topics. Participant will state understanding/return demonstration of topics presented.  Learning Barriers/Preferences:     Learning Barriers/Preferences - 07/02/16 1048      Learning Barriers/Preferences   Learning Barriers None   Learning Preferences Group Instruction;Individual Instruction;Skilled Demonstration;Verbal Instruction;Written Material      Education Topics: Risk Factor Reduction:  -Group instruction that is supported by a PowerPoint presentation. Instructor discusses the definition of a risk factor, different risk factors for pulmonary disease, and how the heart and lungs work together.     Nutrition for Pulmonary Patient:  -Group instruction provided by PowerPoint slides, verbal discussion, and written materials to support subject matter. The instructor gives an explanation and review of healthy diet recommendations, which includes a discussion on weight management, recommendations for fruit and vegetable consumption, as well as protein, fluid, caffeine, fiber, sodium, sugar, and alcohol. Tips for eating when patients are short of breath are discussed.   Pursed Lip Breathing:  -Group instruction that is supported by demonstration and informational handouts. Instructor discusses the benefits of pursed lip and diaphragmatic breathing and detailed demonstration on how to preform both.     Oxygen Safety:  -Group instruction provided by PowerPoint, verbal discussion, and written material to support subject matter. There is an overview of "What is Oxygen" and "Why do we need it".  Instructor also reviews how to create a safe environment for oxygen use, the importance of using oxygen as prescribed, and the risks of  noncompliance. There is a brief discussion on traveling with oxygen and resources the patient may utilize.   Oxygen Equipment:  -Group instruction provided by [facility] Staff utilizing handouts, written materials, and equipment demonstrations.   Signs and Symptoms:  -Group instruction provided by written material and verbal discussion to support subject matter. Warning signs and symptoms of infection, stroke, and heart attack are reviewed and when to call the physician/911 reinforced. Tips for preventing the spread of infection discussed.   Advanced Directives:  -Group instruction provided by verbal instruction and written material to support subject matter. Instructor reviews Advanced Directive laws and proper instruction for filling out document.   Pulmonary Video:  -Group video education that reviews the importance of medication and  oxygen compliance, exercise, good nutrition, pulmonary hygiene, and pursed lip and diaphragmatic breathing for the pulmonary patient.   Exercise for the Pulmonary Patient:  -Group instruction that is supported by a PowerPoint presentation. Instructor discusses benefits of exercise, core components of exercise, frequency, duration, and intensity of an exercise routine, importance of utilizing pulse oximetry during exercise, safety while exercising, and options of places to exercise outside of rehab.     Pulmonary Medications:  -Verbally interactive group education provided by instructor with focus on inhaled medications and proper administration.   Anatomy and Physiology of the Respiratory System and Intimacy:  -Group instruction provided by PowerPoint, verbal discussion, and written material to support subject matter. Instructor reviews respiratory cycle and anatomical components of the respiratory system and their functions. Instructor also reviews differences in obstructive and restrictive respiratory diseases with examples of each. Intimacy, Sex, and  Sexuality differences are reviewed with a discussion on how relationships can change when diagnosed with pulmonary disease. Common sexual concerns are reviewed.   Knowledge Questionnaire Score:   Core Components/Risk Factors/Patient Goals at Admission:     Personal Goals and Risk Factors at Admission - 07/02/16 1048      Core Components/Risk Factors/Patient Goals on Admission    Weight Management Obesity;Yes   Intervention Weight Management/Obesity: Establish reasonable short term and long term weight goals.;Obesity: Provide education and appropriate resources to help participant work on and attain dietary goals.;Weight Management: Develop a combined nutrition and exercise program designed to reach desired caloric intake, while maintaining appropriate intake of nutrient and fiber, sodium and fats, and appropriate energy expenditure required for the weight goal.;Weight Management: Provide education and appropriate resources to help participant work on and attain dietary goals.   Expected Outcomes Short Term: Continue to assess and modify interventions until short term weight is achieved;Weight Loss: Understanding of general recommendations for a balanced deficit meal plan, which promotes 1-2 lb weight loss per week and includes a negative energy balance of [phone] kcal/d;Understanding recommendations for meals to include 15-35% energy as protein, 25-35% energy from fat, 35-60% energy from carbohydrates, less than 200mg  of dietary cholesterol, 20-35 gm of total fiber daily;Understanding of distribution of calorie intake throughout the day with the consumption of 4-5 meals/snacks   Increase Strength and Stamina Yes   Intervention Provide advice, education, support and counseling about physical activity/exercise needs.;Develop an individualized exercise prescription for aerobic and resistive training based on initial evaluation findings, risk stratification, comorbidities and participant's personal  goals.   Expected Outcomes Achievement of increased cardiorespiratory fitness and enhanced flexibility, muscular endurance and strength shown through measurements of functional capacity and personal statement of participant.   Improve shortness of breath with ADL's Yes   Intervention Provide education, individualized exercise plan and daily activity instruction to help decrease symptoms of SOB with activities of daily living.   Expected Outcomes Short Term: Achieves a reduction of symptoms when performing activities of daily living.   Develop more efficient breathing techniques such as purse lipped breathing and diaphragmatic breathing; and practicing self-pacing with activity Yes   Intervention Provide education, demonstration and support about specific breathing techniuqes utilized for more efficient breathing. Include techniques such as pursed lipped breathing, diaphragmatic breathing and self-pacing activity.   Expected Outcomes Short Term: Participant will be able to demonstrate and use breathing techniques as needed throughout daily activities.   Increase knowledge of respiratory medications and ability to use respiratory devices properly  Yes   Intervention Provide education and demonstration as needed of appropriate use of  medications, inhalers, and oxygen therapy.   Expected Outcomes Short Term: Achieves understanding of medications use. Understands that oxygen is a medication prescribed by physician. Demonstrates appropriate use of inhaler and oxygen therapy.   Hypertension Yes   Intervention Provide education on lifestyle modifcations including regular physical activity/exercise, weight management, moderate sodium restriction and increased consumption of fresh fruit, vegetables, and low fat dairy, alcohol moderation, and smoking cessation.;Monitor prescription use compliance.   Expected Outcomes Short Term: Continued assessment and intervention until BP is < 140/9mm HG in hypertensive  participants. < 130/37mm HG in hypertensive participants with diabetes, heart failure or chronic kidney disease.;Long Term: Maintenance of blood pressure at goal levels.   Stress Yes   Intervention Offer individual and/or small group education and counseling on adjustment to heart disease, stress management and health-related lifestyle change. Teach and support self-help strategies.;Refer participants experiencing significant psychosocial distress to appropriate mental health specialists for further evaluation and treatment. When possible, include family members and significant others in education/counseling sessions.   Expected Outcomes Short Term: Participant demonstrates changes in health-related behavior, relaxation and other stress management skills, ability to obtain effective social support, and compliance with psychotropic medications if prescribed.;Long Term: Emotional wellbeing is indicated by absence of clinically significant psychosocial distress or social isolation.      Core Components/Risk Factors/Patient Goals Review:    Core Components/Risk Factors/Patient Goals at Discharge (Final Review):    ITP Comments:   Comments:

## 2016-07-05 ENCOUNTER — Encounter (HOSPITAL_COMMUNITY): Payer: Self-pay | Admitting: *Deleted

## 2016-07-10 ENCOUNTER — Ambulatory Visit (HOSPITAL_COMMUNITY): Payer: 59

## 2016-07-12 ENCOUNTER — Ambulatory Visit (HOSPITAL_COMMUNITY): Payer: 59

## 2016-07-13 ENCOUNTER — Encounter: Payer: Self-pay | Admitting: Family Medicine

## 2016-07-13 ENCOUNTER — Ambulatory Visit (INDEPENDENT_AMBULATORY_CARE_PROVIDER_SITE_OTHER): Payer: 59 | Admitting: Family Medicine

## 2016-07-13 DIAGNOSIS — R2681 Unsteadiness on feet: Secondary | ICD-10-CM | POA: Insufficient documentation

## 2016-07-13 DIAGNOSIS — J309 Allergic rhinitis, unspecified: Secondary | ICD-10-CM | POA: Diagnosis not present

## 2016-07-13 LAB — TSH: TSH: 1.08 m[IU]/L

## 2016-07-13 MED ORDER — AZELASTINE HCL 0.1 % NA SOLN
2.0000 | Freq: Two times a day (BID) | NASAL | 12 refills | Status: DC
Start: 2016-07-13 — End: 2016-12-19

## 2016-07-13 NOTE — Assessment & Plan Note (Signed)
55 y/o female with 2-3 week history of unsteadiness/dizziness. Orthostatics negative. No cardiac symptoms. Cardiac exam unremarkable. Unlikely to be BPPV as not positional. Presentation is complicated by history of SLE/Hypothyroidism. DDx. Includes labyrinthitis, vestibular neuritis, demyelinating disease, vitamin deficiency, hypothyroidism, intracranial mass.  -check labs including TSH, B12, Folate, RPR (previous HIV testing negative) -check MRI brain -return precautions discussed including headaches, dizziness, vision changes, neurologic symptoms.

## 2016-07-13 NOTE — Patient Instructions (Signed)
It was nice to see you today.  Dr. Ree Kida will call you with your lab results.   We will check an MRI of your brain.

## 2016-07-13 NOTE — Progress Notes (Signed)
Subjective:     Patient ID: Gina Wise, female   DOB: 1960-11-27, 55 y.o.   MRN: [id number]  HPI Ms. Gina Wise is a 55 year old female with a history of lupus, Sjogren's, HTN, hypothyroidism, migraines, presenting to clinic today for evaluation of dizziness.  Dizziness Patient reports intermittent episodes of dizziness and imbalance over the last couple of weeks (5-6 times). She reports that before the onset of these symptoms she moved to a new home two weeks ago, which required her to pack and lift heavy objects for two consecutive days. During the dizzy episodes she gets a pressure sensation in her head and begins to lean over, specifically the left side. These headaches are different from her migraines and the dizziness is different from previous vertigo. Says she stays hydrated and does not particularly notice the symptoms when standing up from sitting. No relation to head position. Has no history of recent head trauma. She denies loss of consciousness, muscle weakness or vision loss. Of note, she states that her worst episode was yesterday 9/21 while sitting eating lunch. She felt an odd pressure in her head and felt herself about to fall over, prompting her to grab the table until it subsided. Says that she was unable to speak or think clearly. The patient complains that she has noticed increased difficulty with memory, word finding and comprehension over the last couple of weeks as well, which has caused her frustration. Endorses random episodes of emesis x2 over the last week. Denies fever, cough, chest pain, bowel or urinary changes, congestion, rash, recent travel.  Review of Systems  Cardiovascular: Positive for near-syncope.    Pertinent positives and negatives per HPI.  Blood pressure 131/77, pulse (!) 102, temperature 98.6 F (37 C), temperature source Oral, height 5\' 6"  (1.676 m), weight 197 lb 9.6 oz (89.6 kg), last menstrual period 04/03/2012. Orthostatics: 142/74 lying (P 94),  138/79 sitting (P 95), 131/85 standing (P 104) Objective:   Physical Exam  Constitutional: She is oriented to person, place, and time. She appears well-developed and well-nourished. No distress.  HENT:  Head: Normocephalic and atraumatic.  Right Ear: External ear normal.  Left Ear: External ear normal.  Mouth/Throat: No oropharyngeal exudate.  Eyes: Conjunctivae and EOM are normal. Pupils are equal, round, and reactive to light.  Cardiovascular: Normal rate and regular rhythm.   No murmur heard. Pulmonary/Chest: Effort normal and breath sounds normal. No respiratory distress. She has no wheezes.  Lymphadenopathy:    She has no cervical adenopathy.  Neurological: She is alert and oriented to person, place, and time. She displays normal reflexes. A sensory deficit is present. No cranial nerve deficit. Coordination and gait normal.  Normal strength in upper and lower extremities bilaterally. Decreased sensation to touch on L  Face. Decreased vibratory sense in toes bilaterally. Slightly off balance during Rhomberg. Negative pronator drift.   Skin: No rash noted.     Assessment:    Ms. Gina Wise is a 55 year old female with a history of lupus, Sjogren's, HTN, hypothyroidism, presenting to clinic today for evaluation of dizziness.    Plan:  Unsteadiness on feet 55 y/o female with 2-3 week history of unsteadiness/dizziness. Orthostatics negative. No cardiac symptoms. Cardiac exam unremarkable. Unlikely to be BPPV as not positional. Presentation is complicated by history of SLE/Hypothyroidism. DDx. Includes labyrinthitis, vestibular neuritis, demyelinating disease, vitamin deficiency, hypothyroidism, intracranial mass.  -check labs including TSH, B12, Folate, RPR (previous HIV testing negative) -check MRI brain -return precautions discussed including  headaches, dizziness, vision changes, neurologic symptoms.

## 2016-07-14 LAB — FOLATE: Folate: 20.3 ng/mL (ref >5.4)

## 2016-07-14 LAB — RPR

## 2016-07-14 LAB — VITAMIN B12: Vitamin B-12: 758 pg/mL (ref 200–1100)

## 2016-07-16 ENCOUNTER — Telehealth: Payer: Self-pay | Admitting: *Deleted

## 2016-07-16 DIAGNOSIS — R590 Localized enlarged lymph nodes: Secondary | ICD-10-CM

## 2016-07-16 NOTE — Telephone Encounter (Signed)
Called patient to discuss normal tsh/b12/folate/rpr. Discussed findings of enlarged LN (found by dentist). Given history of autoimmune disease will check CT neck.   RN staff - please schedule CT neck

## 2016-07-16 NOTE — Telephone Encounter (Signed)
Pt went to the dentist this morning and they suggested that you order CBCT scan because the floor of her mouth is swollen along with the glands and nodules. Please advise and call pt back. Hazel Wrinkle Kennon Holter, CMA

## 2016-07-17 ENCOUNTER — Inpatient Hospital Stay (HOSPITAL_COMMUNITY): Admission: RE | Admit: 2016-07-17 | Payer: 59 | Source: Ambulatory Visit

## 2016-07-17 NOTE — Telephone Encounter (Signed)
Spoke with patient. Discussed previous contrast reaction (only hives, no anaphylaxis). She is hesitant to take steroids due to previous adverse reaction. Will have her take Benadryl 50 mg 1 hour prior to CT scan.   RN staff please notify patient of date and time of CT scan.

## 2016-07-17 NOTE — Telephone Encounter (Signed)
Attempted to contact patient to discuss contrast allergy. No answer.

## 2016-07-17 NOTE — Telephone Encounter (Signed)
Appointment scheduled for 10/2 at [place] calling patient to inform but no answer or voicemail, will continue to try. Patient with contrast allergy, will need to be premedicated per radiology. Will forward to PCP.

## 2016-07-19 ENCOUNTER — Ambulatory Visit (HOSPITAL_COMMUNITY): Payer: 59

## 2016-07-19 NOTE — Telephone Encounter (Signed)
Left message on patient voicemail with time and date of CT scan.

## 2016-07-20 ENCOUNTER — Ambulatory Visit (HOSPITAL_COMMUNITY)
Admission: RE | Admit: 2016-07-20 | Discharge: 2016-07-20 | Disposition: A | Discharge status: Home / Self Care | Payer: 59 | Source: Ambulatory Visit | Attending: Family Medicine | Admitting: Family Medicine

## 2016-07-20 ENCOUNTER — Encounter (HOSPITAL_COMMUNITY): Payer: Self-pay | Admitting: Radiology

## 2016-07-20 DIAGNOSIS — R2681 Unsteadiness on feet: Secondary | ICD-10-CM | POA: Diagnosis not present

## 2016-07-20 LAB — CREATININE, SERUM: Creatinine, Ser: 0.76 mg/dL (ref 0.44–1.00)

## 2016-07-20 MED ORDER — GADOBENATE DIMEGLUMINE 529 MG/ML IV SOLN
10.0000 mL | Freq: Once | INTRAVENOUS | Status: AC | PRN
  Administered 2016-07-20: 10 mL via INTRAVENOUS

## 2016-07-23 ENCOUNTER — Ambulatory Visit (HOSPITAL_COMMUNITY)
Admission: RE | Admit: 2016-07-23 | Discharge: 2016-07-23 | Disposition: A | Discharge status: Home / Self Care | Payer: 59 | Source: Ambulatory Visit | Attending: Family Medicine | Admitting: Family Medicine

## 2016-07-23 ENCOUNTER — Other Ambulatory Visit: Payer: Self-pay | Admitting: Family Medicine

## 2016-07-23 DIAGNOSIS — R590 Localized enlarged lymph nodes: Secondary | ICD-10-CM

## 2016-07-24 ENCOUNTER — Ambulatory Visit (HOSPITAL_COMMUNITY): Payer: 59

## 2016-07-24 ENCOUNTER — Telehealth: Payer: Self-pay | Admitting: Family Medicine

## 2016-07-24 NOTE — Telephone Encounter (Signed)
Called patient to let her know that recent CT neck was negative for mass/lesion. The initial reason for checking the CT was that her Dentist thought she had swelling in the base of her mouth. I have asked her to follow up with her Dentist for additional follow up.

## 2016-07-25 ENCOUNTER — Telehealth: Payer: Self-pay | Admitting: Family Medicine

## 2016-07-25 NOTE — Telephone Encounter (Signed)
Pt would like to know if we are able to e-mail a copy of the scan to pt's dentist. Anthoney Harada is [email or web address]. Phone number [phone], Dr. Humphrey Rolls. Please advise. Thanks! ep

## 2016-07-26 ENCOUNTER — Ambulatory Visit (HOSPITAL_COMMUNITY): Payer: 59

## 2016-07-27 ENCOUNTER — Encounter: Payer: Self-pay | Admitting: Family Medicine

## 2016-07-28 ENCOUNTER — Telehealth: Payer: Self-pay | Admitting: Family Medicine

## 2016-07-28 NOTE — Telephone Encounter (Signed)
**  After Hours/ Emergency Line Call*  Received a call to report that [name] needed to speak to a physician.  I attempted to call patient back at [phone].  No answer after several rings. No VM set up.  Encourage patient to call operator back if continued need arises.  Will cc: patient's PCP to follow up as well.  Ashly M. Lajuana Ripple, DO PGY-3, [facility]

## 2016-07-31 ENCOUNTER — Emergency Department (HOSPITAL_COMMUNITY): Payer: 59

## 2016-07-31 ENCOUNTER — Ambulatory Visit (HOSPITAL_COMMUNITY): Payer: 59

## 2016-07-31 ENCOUNTER — Telehealth: Payer: Self-pay | Admitting: Family Medicine

## 2016-07-31 ENCOUNTER — Encounter (HOSPITAL_COMMUNITY): Payer: Self-pay | Admitting: Emergency Medicine

## 2016-07-31 DIAGNOSIS — Z91041 Radiographic dye allergy status: Secondary | ICD-10-CM | POA: Insufficient documentation

## 2016-07-31 DIAGNOSIS — Z881 Allergy status to other antibiotic agents status: Secondary | ICD-10-CM | POA: Diagnosis not present

## 2016-07-31 DIAGNOSIS — Z885 Allergy status to narcotic agent status: Secondary | ICD-10-CM | POA: Diagnosis not present

## 2016-07-31 DIAGNOSIS — M3213 Lung involvement in systemic lupus erythematosus: Secondary | ICD-10-CM | POA: Diagnosis not present

## 2016-07-31 DIAGNOSIS — Z800 Family history of malignant neoplasm of digestive organs: Secondary | ICD-10-CM | POA: Insufficient documentation

## 2016-07-31 DIAGNOSIS — Z888 Allergy status to other drugs, medicaments and biological substances status: Secondary | ICD-10-CM | POA: Insufficient documentation

## 2016-07-31 DIAGNOSIS — I700 Atherosclerosis of aorta: Secondary | ICD-10-CM | POA: Diagnosis not present

## 2016-07-31 DIAGNOSIS — Z90710 Acquired absence of both cervix and uterus: Secondary | ICD-10-CM | POA: Diagnosis not present

## 2016-07-31 DIAGNOSIS — K222 Esophageal obstruction: Secondary | ICD-10-CM | POA: Diagnosis not present

## 2016-07-31 DIAGNOSIS — M3500 Sicca syndrome, unspecified: Secondary | ICD-10-CM | POA: Insufficient documentation

## 2016-07-31 DIAGNOSIS — Z803 Family history of malignant neoplasm of breast: Secondary | ICD-10-CM | POA: Insufficient documentation

## 2016-07-31 DIAGNOSIS — Z801 Family history of malignant neoplasm of trachea, bronchus and lung: Secondary | ICD-10-CM | POA: Insufficient documentation

## 2016-07-31 DIAGNOSIS — Z8049 Family history of malignant neoplasm of other genital organs: Secondary | ICD-10-CM | POA: Insufficient documentation

## 2016-07-31 DIAGNOSIS — F329 Major depressive disorder, single episode, unspecified: Secondary | ICD-10-CM | POA: Insufficient documentation

## 2016-07-31 DIAGNOSIS — E038 Other specified hypothyroidism: Secondary | ICD-10-CM | POA: Diagnosis not present

## 2016-07-31 DIAGNOSIS — K920 Hematemesis: Secondary | ICD-10-CM | POA: Diagnosis present

## 2016-07-31 DIAGNOSIS — E876 Hypokalemia: Secondary | ICD-10-CM | POA: Insufficient documentation

## 2016-07-31 DIAGNOSIS — K3184 Gastroparesis: Secondary | ICD-10-CM | POA: Diagnosis not present

## 2016-07-31 DIAGNOSIS — F418 Other specified anxiety disorders: Secondary | ICD-10-CM | POA: Insufficient documentation

## 2016-07-31 DIAGNOSIS — Z8719 Personal history of other diseases of the digestive system: Secondary | ICD-10-CM | POA: Insufficient documentation

## 2016-07-31 DIAGNOSIS — G43909 Migraine, unspecified, not intractable, without status migrainosus: Secondary | ICD-10-CM | POA: Insufficient documentation

## 2016-07-31 DIAGNOSIS — K449 Diaphragmatic hernia without obstruction or gangrene: Secondary | ICD-10-CM | POA: Diagnosis not present

## 2016-07-31 DIAGNOSIS — I10 Essential (primary) hypertension: Secondary | ICD-10-CM | POA: Diagnosis not present

## 2016-07-31 DIAGNOSIS — J8410 Pulmonary fibrosis, unspecified: Secondary | ICD-10-CM | POA: Diagnosis not present

## 2016-07-31 DIAGNOSIS — K3189 Other diseases of stomach and duodenum: Secondary | ICD-10-CM | POA: Diagnosis not present

## 2016-07-31 DIAGNOSIS — K219 Gastro-esophageal reflux disease without esophagitis: Secondary | ICD-10-CM | POA: Insufficient documentation

## 2016-07-31 DIAGNOSIS — E7800 Pure hypercholesterolemia, unspecified: Secondary | ICD-10-CM | POA: Diagnosis not present

## 2016-07-31 DIAGNOSIS — K5730 Diverticulosis of large intestine without perforation or abscess without bleeding: Secondary | ICD-10-CM | POA: Insufficient documentation

## 2016-07-31 LAB — BASIC METABOLIC PANEL
Anion gap: 12 (ref 5–15)
BUN: 14 mg/dL (ref 6–20)
CALCIUM: 9.8 mg/dL (ref 8.9–10.3)
CO2: 25 mmol/L (ref 22–32)
Chloride: 102 mmol/L (ref 101–111)
Creatinine, Ser: 0.82 mg/dL (ref 0.44–1.00)
GFR calc Af Amer: >60 mL/min (ref >60)
GLUCOSE: 108 mg/dL — AB (ref 65–99)
Potassium: 3.1 mmol/L — ABNORMAL LOW (ref 3.5–5.1)
Sodium: 139 mmol/L (ref 135–145)

## 2016-07-31 LAB — CBC
HCT: 43 % (ref 36.0–46.0)
Hemoglobin: 15 g/dL (ref 12.0–15.0)
MCH: 30.4 pg (ref 26.0–34.0)
MCHC: 34.9 g/dL (ref 30.0–36.0)
MCV: 87.2 fL (ref 78.0–100.0)
Platelets: 334 10*3/uL (ref 150–400)
RBC: 4.93 MIL/uL (ref 3.87–5.11)
RDW: 14 % (ref 11.5–15.5)
WBC: 8.5 10*3/uL (ref 4.0–10.5)

## 2016-07-31 LAB — I-STAT TROPONIN, ED: TROPONIN I, POC: 0 ng/mL (ref 0.00–0.08)

## 2016-07-31 NOTE — Telephone Encounter (Signed)
Returned patient call. Patient states she has vomited "a startling amount of blood." C/o indigestion X 2 days and reports a small hiatal hernia. Discussed with PCP, Dr. Ree Kida, and patient instructed to head directly to ED. Patient states she will comply. Velora Heckler, RN

## 2016-07-31 NOTE — Telephone Encounter (Signed)
Triage not available.

## 2016-07-31 NOTE — ED Triage Notes (Signed)
Per pt, "i'm vomiting blood, bright right and it filled up the bowl. It happened just today twice". Pt endorses abdominal cramping. Also c/o epigastric pain. Pt hx of pulmonary fibrosis. EKG being done in triage.

## 2016-07-31 NOTE — Telephone Encounter (Signed)
Communicated with patient via mychart. Told her to make appointment in office.

## 2016-07-31 NOTE — Telephone Encounter (Signed)
Is still feeling bad but today she started vomiting blood.  It is bright red blood and a lot of it.  And a lot of bile, please advise

## 2016-08-01 ENCOUNTER — Observation Stay (HOSPITAL_COMMUNITY)
Admission: EM | Admit: 2016-08-01 | Discharge: 2016-08-03 | Disposition: A | Discharge status: Home / Self Care | Payer: 59 | Attending: Family Medicine | Admitting: Family Medicine

## 2016-08-01 DIAGNOSIS — M359 Systemic involvement of connective tissue, unspecified: Secondary | ICD-10-CM | POA: Diagnosis present

## 2016-08-01 DIAGNOSIS — K253 Acute gastric ulcer without hemorrhage or perforation: Secondary | ICD-10-CM

## 2016-08-01 DIAGNOSIS — I10 Essential (primary) hypertension: Secondary | ICD-10-CM | POA: Diagnosis present

## 2016-08-01 DIAGNOSIS — E039 Hypothyroidism, unspecified: Secondary | ICD-10-CM | POA: Diagnosis present

## 2016-08-01 DIAGNOSIS — K920 Hematemesis: Secondary | ICD-10-CM | POA: Diagnosis not present

## 2016-08-01 DIAGNOSIS — K222 Esophageal obstruction: Secondary | ICD-10-CM

## 2016-08-01 DIAGNOSIS — M358 Other specified systemic involvement of connective tissue: Secondary | ICD-10-CM

## 2016-08-01 DIAGNOSIS — K219 Gastro-esophageal reflux disease without esophagitis: Secondary | ICD-10-CM | POA: Diagnosis present

## 2016-08-01 DIAGNOSIS — J8489 Other specified interstitial pulmonary diseases: Secondary | ICD-10-CM | POA: Diagnosis present

## 2016-08-01 HISTORY — DX: Hematemesis: K92.0

## 2016-08-01 LAB — BASIC METABOLIC PANEL
ANION GAP: 10 (ref 5–15)
BUN: 14 mg/dL (ref 6–20)
CALCIUM: 9.1 mg/dL (ref 8.9–10.3)
CHLORIDE: 101 mmol/L (ref 101–111)
CO2: 28 mmol/L (ref 22–32)
Creatinine, Ser: 0.79 mg/dL (ref 0.44–1.00)
GFR calc non Af Amer: >60 mL/min (ref >60)
Glucose, Bld: 113 mg/dL — ABNORMAL HIGH (ref 65–99)
Potassium: 2.9 mmol/L — ABNORMAL LOW (ref 3.5–5.1)
SODIUM: 139 mmol/L (ref 135–145)

## 2016-08-01 LAB — CBC
HCT: 39.5 % (ref 36.0–46.0)
HEMATOCRIT: 40.2 % (ref 36.0–46.0)
HEMOGLOBIN: 13.4 g/dL (ref 12.0–15.0)
Hemoglobin: 14 g/dL (ref 12.0–15.0)
MCH: 30.2 pg (ref 26.0–34.0)
MCH: 29.6 pg (ref 26.0–34.0)
MCHC: 34.8 g/dL (ref 30.0–36.0)
MCHC: 33.9 g/dL (ref 30.0–36.0)
MCV: 86.8 fL (ref 78.0–100.0)
MCV: 87.2 fL (ref 78.0–100.0)
PLATELETS: 326 10*3/uL (ref 150–400)
Platelets: 278 10*3/uL (ref 150–400)
RBC: 4.63 MIL/uL (ref 3.87–5.11)
RBC: 4.53 MIL/uL (ref 3.87–5.11)
RDW: 14.1 % (ref 11.5–15.5)
RDW: 14.2 % (ref 11.5–15.5)
WBC: 7.4 10*3/uL (ref 4.0–10.5)
WBC: 6.3 10*3/uL (ref 4.0–10.5)

## 2016-08-01 LAB — MAGNESIUM: MAGNESIUM: 1.9 mg/dL (ref 1.7–2.4)

## 2016-08-01 LAB — ABO/RH: ABO/RH(D): A POS

## 2016-08-01 LAB — TYPE AND SCREEN
ABO/RH(D): A POS
ANTIBODY SCREEN: NEGATIVE

## 2016-08-01 MED ORDER — MORPHINE SULFATE (PF) 2 MG/ML IV SOLN
1.0000 mg | INTRAVENOUS | Status: DC | PRN
Start: 2016-08-01 — End: 2016-08-03
  Administered 2016-08-01 – 2016-08-03 (×5): 1 mg via INTRAVENOUS
  Filled 2016-08-01 (×5): qty 1

## 2016-08-01 MED ORDER — ALBUTEROL SULFATE (2.5 MG/3ML) 0.083% IN NEBU: 3.0000 mL | INHALATION_SOLUTION | Freq: Four times a day (QID) | RESPIRATORY_TRACT | Status: DC | PRN

## 2016-08-01 MED ORDER — FENTANYL CITRATE (PF) 100 MCG/2ML IJ SOLN
50.0000 ug | Freq: Once | INTRAMUSCULAR | Status: AC
  Administered 2016-08-01: 50 ug via INTRAVENOUS
  Filled 2016-08-01: qty 2

## 2016-08-01 MED ORDER — POTASSIUM CHLORIDE 10 MEQ/100ML IV SOLN
10.0000 meq | INTRAVENOUS | Status: AC
  Administered 2016-08-01 (×4): 10 meq via INTRAVENOUS
  Filled 2016-08-01 (×4): qty 100

## 2016-08-01 MED ORDER — POTASSIUM CHLORIDE CRYS ER 20 MEQ PO TBCR: 40.0000 meq | EXTENDED_RELEASE_TABLET | Freq: Once | ORAL | Status: DC

## 2016-08-01 MED ORDER — HYDROMORPHONE HCL 1 MG/ML IJ SOLN
0.5000 mg | INTRAMUSCULAR | Status: DC | PRN
  Administered 2016-08-01: 0.5 mg via INTRAVENOUS
  Filled 2016-08-01: qty 1

## 2016-08-01 MED ORDER — HYDRALAZINE HCL 20 MG/ML IJ SOLN: 5.0000 mg | INTRAMUSCULAR | Status: DC | PRN

## 2016-08-01 MED ORDER — DEXTROSE-NACL 5-0.9 % IV SOLN
INTRAVENOUS | Status: DC
  Administered 2016-08-01 – 2016-08-03 (×4): via INTRAVENOUS

## 2016-08-01 MED ORDER — LEVOTHYROXINE SODIUM 100 MCG IV SOLR
56.0000 ug | Freq: Every day | INTRAVENOUS | Status: DC
  Administered 2016-08-01 – 2016-08-03 (×3): 56 ug via INTRAVENOUS
  Filled 2016-08-01 (×3): qty 5

## 2016-08-01 MED ORDER — ONDANSETRON HCL 4 MG/2ML IJ SOLN
4.0000 mg | Freq: Four times a day (QID) | INTRAMUSCULAR | Status: DC | PRN
  Administered 2016-08-01 – 2016-08-03 (×6): 4 mg via INTRAVENOUS
  Filled 2016-08-01 (×6): qty 2

## 2016-08-01 MED ORDER — SUMATRIPTAN SUCCINATE 6 MG/0.5ML ~~LOC~~ SOLN
6.0000 mg | Freq: Once | SUBCUTANEOUS | Status: AC
  Administered 2016-08-01: 6 mg via SUBCUTANEOUS
  Filled 2016-08-01: qty 0.5

## 2016-08-01 MED ORDER — FAMOTIDINE IN NACL 20-0.9 MG/50ML-% IV SOLN
20.0000 mg | Freq: Two times a day (BID) | INTRAVENOUS | Status: DC
  Administered 2016-08-01 – 2016-08-02 (×3): 20 mg via INTRAVENOUS
  Filled 2016-08-01 (×6): qty 50

## 2016-08-01 NOTE — Progress Notes (Signed)
Initial Nutrition Assessment  DOCUMENTATION CODES:   Obesity unspecified  INTERVENTION:  Diet advancement per MD team. RD to order nutritional supplements once diet advances as appropriate.  NUTRITION DIAGNOSIS:   Inadequate oral intake related to nausea, vomiting as evidenced by per patient/family report.  GOAL:   Patient will meet greater than or equal to 90% of their needs  MONITOR:   Diet advancement, Labs, Weight trends, Skin, I & O's  REASON FOR ASSESSMENT:   Malnutrition Screening Tool    ASSESSMENT:   55 y.o. female with a past medical history significant for Lupus, Sjogren's syndrome, GERD, diverticulosis, gastritis, HTN, hypothyroidism, migraines, depression and multiple abdominal surgeries (appendectomy, hysterectomy, inguinal hernia repair) who presented with 3 episodes of hematemesis most likely secondary to complications from GERD.  Pt is currently NPO. Pt reports abdominal pains which has been ongoing over the past 1 week. Pt reports consuming 3 meals a day which consists of only soup (chicken noodle) and crackers. Usual body weight ~197 lbs. Question most accurate recorded weight. RD to order nutritional supplements once diet advances as appropriate.  Unable to complete Nutrition-Focused physical exam at this time. Pt reports severe nausea and did not want to be moved or touched. RD to perform exam at next visit.   Labs and medications reviewed. Potassium low at 2.9.  Diet Order:  Diet NPO time specified  Skin:  Reviewed, no issues  Last BM:  10/10  Height:   Ht Readings from Last 1 Encounters:  08/01/16 5\' 6"  (1.676 m)    Weight:   Wt Readings from Last 1 Encounters:  08/01/16 191 lb 9.6 oz (86.9 kg)    Ideal Body Weight:  59 kg  BMI:  Body mass index is 30.93 kg/m.  Estimated Nutritional Needs:   Kcal:  1800-2000  Protein:  85-95 grams  Fluid:  1.8-2 L/day  EDUCATION NEEDS:   No education needs identified at this time  Corrin Parker, MS, RD, LDN Pager # [phone] After hours/ weekend pager # [phone]

## 2016-08-01 NOTE — Progress Notes (Signed)
Notified MD Pt has not eaten in 24 hours. Pt is upset that GI have not come to see her yet. MD wants pt to remain NPO until GI consult see her. No timeframe given yet.

## 2016-08-01 NOTE — Progress Notes (Signed)
Received call from RN staff that patient is having migraine due to NPO status. Called patient room and explained reasoning for NPO (Hematemeis, awaiting GI input). Agreed to give Imitrex SubQ. Will start clear liquid diet if OK'ed by GI team.  Dossie Arbour MD

## 2016-08-01 NOTE — ED Provider Notes (Signed)
[place] DEPT Provider Note   CSN: [id number] Arrival date & time: 07/31/16  1857  By signing my name below, I, Hansel Feinstein, attest that this documentation has been prepared under the direction and in the presence of Varney Biles, MD. Electronically Signed: Hansel Feinstein, ED Scribe. 08/01/16. 12:54 AM.     History   Chief Complaint Chief Complaint  Patient presents with  . Abdominal Pain  . Hematemesis    HPI Gina Wise is a 55 y.o. female with h/o diverticulosis, gastritis, GERD on bid Nexium, appendectomy who presents to the Emergency Department complaining of moderate, gradually worsening, burning epigastric and substernal chest pain onset 2 weeks ago. She reports vomiting and diarrhea last week that resolved, followed by three episodes of hematemesis with "bright red blood" that began today (last episode at 4PM). Pt states she believes she vomited a normal amount that was all blood. She complains of dizziness and lightheadedness for the last two weeks as well, and states this did not worsen since experiencing hematemesis. Pt states her current pain feels similar to heart burn. Upper endoscopy on 05/06/14 showed squamocolumnar junctional mucosa with focal goblet cells. Last upper endoscopy on 04/28/15 showed chronic gastritis with mucosal erosion, benign squamo glandular mucosa with chronic inflammation. Pt does not currently take any blood thinners or steroids. She reports her longest period of steroid use was 3 months at a time. She denies melena.   The history is provided by the patient. No language interpreter was used.    Past Medical History:  Diagnosis Date  . Allergy   . Anemia   . Chronic bronchitis ([place])    "get it q yr; in the spring time" (02/17/2014)  . Depression    "have taken zoloft since 1996" (02/17/2014)  . Diverticulosis   . Gastritis   . GERD (gastroesophageal reflux disease)   . Grover's disease   . High cholesterol   . Hypertension   . Hypothyroid     . Migraines    "maybe a couple/yr" (02/17/2014)  . Multiple environmental allergies   . Overweight(278.02)   . Peri-menopause   . Schatzki's ring   . Sjogren's disease ([employer])   . Systemic lupus [facility])     Patient Active Problem List   Diagnosis Date Noted  . Acute gastric erosion   . Hematemesis with nausea 08/01/2016  . Unsteadiness on feet 07/13/2016  . Allergic rhinitis 07/13/2016  . Pain in the chest 05/23/2016  . Sinusitis 05/03/2016  . Lentigo 02/17/2016  . Skin lesion 02/17/2016  . Lupus (systemic lupus erythematosus) ([place]) 02/17/2016  . Upper airway cough syndrome 12/16/2015  . Epistaxis 12/01/2015  . Interstitial lung disease due to connective tissue disease ([place]) 11/07/2015  . Frontal sinus pain 11/01/2015  . Hair loss 08/19/2015  . CAP (community acquired pneumonia) 08/03/2015  . Shortness of breath   . Lupus   . Sjogren's disease ([place])   . Essential hypertension   . Other specified hypothyroidism   . Lung disease with systemic lupus erythematosus ([place]) 07/14/2015  . Rash and nonspecific skin eruption 05/20/2015  . Bruising 05/10/2015  . Pain in joint, multiple sites 05/10/2015  . Shingles 02/16/2015  . 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 Allergic conjunctivitis 12/13/2014  . Throat clearing 08/10/2014  . Hyperlipidemia 08/10/2014  . Medication side effect 03/30/2014  . Gastroparesis 03/11/2014  . Schatzki's ring 03/09/2014  . Unspecified constipation 02/23/2014  . GERD (gastroesophageal reflux disease) 02/23/2014  . Goiter 07/03/2013  . Cervical lymphadenopathy 06/15/2013  .  Depression with anxiety 06/15/2013  . Cough 01/02/2013  . Headache(784.0) 10/24/2012  . Iron deficiency 03/13/2012  . Fatigue 03/12/2012  . Dysfunctional uterine bleeding 01/02/2012  . Insomnia 10/03/2011  . Dyslipidemia 09/03/2011  . Neck pain, chronic 09/03/2011  . Overweight 01/03/2011  . 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 Migraine 10/04/2009  .  Hypothyroidism 09/09/2009  . Essential hypertension, benign 09/09/2009    Past Surgical History:  Procedure Laterality Date  . APPENDECTOMY  1987  . ESOPHAGOGASTRODUODENOSCOPY N/A 02/28/2014   Procedure: ESOPHAGOGASTRODUODENOSCOPY (EGD);  Surgeon: Jerene Bears, MD;  Location: [facility] ENDOSCOPY;  Service: Endoscopy;  Laterality: N/A;  . EXPLORATORY LAPAROTOMY  1987  . FOOT SURGERY Right ~1980   "don't know what they did; had to take something out"  . INGUINAL HERNIA REPAIR Right ~ 2003  . SHOULDER OPEN ROTATOR CUFF REPAIR Left 1995  . SHOULDER SURGERY Left 1997   "cartilage"  . TONSILLECTOMY AND ADENOIDECTOMY  1976  . TUBAL LIGATION  1998   laparoscopic  . VAGINAL HYSTERECTOMY  04/08/2012   menorrhagia    OB History    Gravida Para Term Preterm AB Living   2 2 2     2    SAB TAB Ectopic Multiple Live Births                   Home Medications    Prior to Admission medications   Medication Sig Start Date End Date Taking? Authorizing Provider  albuterol (PROVENTIL HFA;VENTOLIN HFA) 108 (90 Base) MCG/ACT inhaler Inhale 2 puffs into the lungs every 6 (six) hours as needed for wheezing or shortness of breath. 05/23/16  Yes Elberta Leatherwood, MD  azaTHIOprine (IMURAN) 50 MG tablet Take 100 mg by mouth 2 (two) times daily.  01/26/16  Yes Historical Provider, MD  azelastine (ASTELIN) 0.1 % nasal spray Place 2 sprays into both nostrils 2 (two) times daily. Use in each nostril as directed 07/13/16  Yes [name], MD  b complex vitamins capsule Take 1 capsule by mouth daily.   Yes Historical Provider, MD  esomeprazole (NEXIUM) 40 MG capsule Take 1 capsule (40 mg total) by mouth 2 (two) times daily before a meal. 12/16/15  Yes Tanda Rockers, MD  fexofenadine (ALLEGRA) 180 MG tablet Take 180 mg by mouth daily.   Yes Historical Provider, MD  hydrochlorothiazide (HYDRODIURIL) 25 MG tablet Take 1 tablet (25 mg total) by mouth daily. 03/13/16  Yes [name], MD  hydroxychloroquine (PLAQUENIL) 200 MG  tablet Take 200 mg by mouth 2 (two) times daily.    Yes Historical Provider, MD  levothyroxine (SYNTHROID, LEVOTHROID) 112 MCG tablet Take 1 tablet (112 mcg total) by mouth daily before breakfast. 04/19/16  Yes [name], MD  Multiple Vitamins-Minerals (BODY/HAIR/SKIN/NAILS) CAPS Take 1 capsule by mouth daily.   Yes Historical Provider, MD  sertraline (ZOLOFT) 100 MG tablet Take one-half tablet by  mouth daily 06/29/16  Yes [name], MD  SUMAtriptan (IMITREX) 100 MG tablet Take 1 tablet (100 mg total) by mouth every 2 (two) hours as needed for migraine. 02/01/16  Yes [name], MD  Vitamin D, Cholecalciferol, 400 UNITS TABS Take 400 Units by mouth daily.   Yes Historical Provider, MD  zolpidem (AMBIEN) 5 MG tablet Take 1 tablet (5 mg total) by mouth at bedtime as needed for sleep. 03/13/16  Yes [name], MD  chlorpheniramine-HYDROcodone (TUSSIONEX) 10-8 MG/5ML SUER Take 5 mLs by mouth every 12 (  twelve) hours as needed for cough. Patient not taking: Reported on 08/01/2016 12/16/15   Tanda Rockers, MD  promethazine (PHENERGAN) 12.5 MG tablet Take 1 tablet (12.5 mg total) by mouth every 8 (eight) hours as needed for nausea or vomiting. Patient not taking: Reported on 08/01/2016 03/08/16   [name], MD    Family History Family History  Problem Relation Age of Onset  . Hypertension Father   . Emphysema Father   . Vision loss Father   . Alcohol abuse Father   . Hypertension Mother   . Heart disease Mother   . Hepatitis Mother     Hep C  . Endometriosis Mother   . Lupus Sister   . Endometriosis Sister   . Breast cancer Maternal Aunt     30's  . Breast cancer Paternal Aunt     69's  . Lung cancer Paternal Uncle     smoker  . Colon cancer Maternal Grandfather   . Uterine cancer Maternal Aunt     50's  . Cervical cancer Paternal Aunt     40's  . Lung cancer Paternal Uncle   . Stomach cancer Neg Hx     Social History Social History  Substance Use Topics  . Smoking  status: Never Smoker  . Smokeless tobacco: Never Used  . Alcohol use 0.0 oz/week     Comment: 02/17/2014 "might have a few drinks a couple times/yr"     Allergies   Contrast media  [iodinated diagnostic agents]; Other; Codeine; Erythromycin; and Iohexol   Review of Systems Review of Systems A complete 10 system review of systems was obtained and all systems are negative except as noted in the HPI and PMH.    Physical Exam Updated Vital Signs BP 136/73 (BP Location: Right Arm)   Pulse 76   Temp 97.5 F (36.4 C) (Oral)   Resp 18   Ht 5\' 6"  (1.676 m)   Wt 190 lb 14.7 oz (86.6 kg)   LMP 04/03/2012   SpO2 98%   BMI 30.81 kg/m   Physical Exam  Constitutional: She appears well-developed and well-nourished.  HENT:  Head: Normocephalic.  Mucous membranes are moist.   Eyes: Conjunctivae are normal.  Cardiovascular: Normal rate, regular rhythm and normal heart sounds.   Pulmonary/Chest: Effort normal and breath sounds normal. No respiratory distress. She has no wheezes.  Lungs CTA bilaterally.   Abdominal: She exhibits no distension.  Musculoskeletal: Normal range of motion.  Neurological: She is alert.  Skin: Skin is warm and dry.  Cap refill < 3 seconds.   Psychiatric: She has a normal mood and affect. Her behavior is normal.  Nursing note and vitals reviewed.    ED Treatments / Results   DIAGNOSTIC STUDIES: Oxygen Saturation is 96% on RA, adequate by my interpretation.    COORDINATION OF CARE: 12:49 AM Discussed treatment plan with pt at bedside which includes lab work  and pt agreed to plan.   12:57 AM Discussed pt with family practice, who will admit the pt.   Labs (all labs ordered are listed, but only abnormal results are displayed) Labs Reviewed  BASIC METABOLIC PANEL - Abnormal; Notable for the following:       Result Value   Potassium 3.1 (*)    Glucose, Bld 108 (*)    All other components within normal limits  BASIC METABOLIC PANEL - Abnormal; Notable  for the following:    Potassium 2.9 (*)    Glucose, Bld 113 (*)  All other components within normal limits  BASIC METABOLIC PANEL - Abnormal; Notable for the following:    Potassium 3.4 (*)    All other components within normal limits  CBC  CBC  CBC  MAGNESIUM  HEMOGLOBIN AND HEMATOCRIT, BLOOD  I-STAT TROPOININ, ED  TYPE AND SCREEN  ABO/RH    EKG  EKG Interpretation  Date/Time:  Tuesday July 31 2016 19:17:35 EDT Ventricular Rate:  104 PR Interval:  146 QRS Duration: 88 QT Interval:  340 QTC Calculation: 447 R Axis:   -4 Text Interpretation:  Sinus tachycardia Possible Anterior infarct , age undetermined Abnormal ECG No significant change since last tracing Confirmed by Kathrynn Humble, MD, Thelma Comp [phone]) on 08/01/2016 12:46:19 AM       Radiology No results found.  Procedures Procedures (including critical care time)  Medications Ordered in ED Medications  albuterol (PROVENTIL) (2.5 MG/3ML) 0.083% nebulizer solution 3 mL ( Inhalation MAR Unhold 08/02/16 1526)  dextrose 5 %-0.9 % sodium chloride infusion ( Intravenous New Bag/Given 08/02/16 2044)  HYDROmorphone (DILAUDID) injection 0.5 mg ( Intravenous MAR Unhold 08/02/16 1526)  ondansetron (ZOFRAN) injection 4 mg (4 mg Intravenous Given 08/02/16 2044)  hydrALAZINE (APRESOLINE) injection 5 mg ( Intravenous MAR Unhold 08/02/16 1526)  morphine 2 MG/ML injection 1 mg (1 mg Intravenous Given 08/03/16 0036)  levothyroxine (SYNTHROID, LEVOTHROID) injection 56 mcg ( Intravenous MAR Unhold 08/02/16 1526)  sucralfate (CARAFATE) tablet 1 g (1 g Oral Given 08/02/16 2057)  pantoprazole (PROTONIX) EC tablet 40 mg (not administered)  fentaNYL (SUBLIMAZE) injection 50 mcg (50 mcg Intravenous Given 08/01/16 0124)  potassium chloride 10 mEq in 100 mL IVPB (10 mEq Intravenous Given 08/01/16 1457)  SUMAtriptan (IMITREX) injection 6 mg (6 mg Subcutaneous Given 08/01/16 1728)     Initial Impression / Assessment and Plan / ED Course  I  have reviewed the triage vital signs and the nursing notes.  Pertinent labs & imaging results that were available during my care of the patient were reviewed by me and considered in my medical decision making (see chart for details).  Clinical Course     Final Clinical Impressions(s) / ED Diagnoses   Final diagnoses:  Hematemesis with nausea   Pt  Comes in in with hematemesis. She has had upper endoscopy in the past x 2 - and there was certainly progression of  Disease between the 2 scans. Pt has gastritis. She will be admitted for obs with concerns for active upper gi bleeding. Stable at the moment. New Prescriptions Current Discharge Medication List        Varney Biles, MD 08/03/16 [phone]

## 2016-08-01 NOTE — Progress Notes (Signed)
New Admission Note: pt admitted to [facility] from the [facility] ED  Arrival Method: Via Stretcher Mental Orientation: Alert and Oriented x 4 Telemetry: Placed on telemetry box 24 per order Assessment: Completed Skin: Intact IV: to L hand Pain: Denies Tubes: None Safety Measures: Safety Fall Prevention Plan has been discussed  Admission:  [address] Orientation: Patient has been orientated to the room, unit and staff.  Family: None at bedside at this time.  Orders to be reviewed and implemented. Will continue to monitor the patient. Call light has been placed within reach and bed alarm has been activated.   Mady Gemma, BSN, RN-BC Phone: [phone]

## 2016-08-01 NOTE — Consult Note (Signed)
[facility] Consult: 3:12 PM 08/01/2016  LOS: 0 days    Referring Provider: Dr Ree Kida  Primary Care Physician:  [name], MD Primary Gastroenterologist:  Dr. Hilarie Fredrickson  IMPRESSION:   *  Hematemesis. Couple of weeks of worsening pyrosis despite compliance with daily Nexium. Rule out refractory GER. Rule out infectious esophagitis.  *  Hypokalemia.  Received 4 runs of KCl today.  *  History nonobstructing Schatzki's ring, status post dilation in 05/2014. Patient denies dysphagia.  *  Migraine headache.    PLAN:     *  Arranged upper endoscopy tomorrow at 2 PM. Allow clears today. I did advise her to take it slowly and consume small volumes of clears to start with to see whether or not she could keep this down.  *  Currently, instead of her Nexium, teaching service has ordered IV Pepcid. Continue this along with Zofran prn  *  Ordered bmet for the morning.   Azucena Freed  08/01/2016, 3:12 PM Pager: [place] Attending   I have taken an interval history, reviewed the chart and examined the patient. I agree with the Advanced Practitioner's note, impression and recommendations.    Other consderations would be a Mallory Weiss-tear  Gatha Mayer, MD, [facility] [phone] (pager) [phone] after 5 PM, weekends and holidays  08/01/2016 5:55 PM   Reason for Consultation:  Hematemesis   HPI: Gina Wise is a 55 y.o. female.  PMH Lupus. Sjogren's syndrome. Migraines. Depression. Hypothyroidism.  Depression.  GERD, Schatzki's ring.  Diverticulosis.  Status post multiple abdominal/pelvic surgeries including appendectomy, hysterectomy, inguinal hernia repair.  05/2014 Colonoscopy. Mild diverticulosis of right colon 05/2014 EGD.  Revealed Schatzki's ring.  This was dilated with TTS balloon to 15 mm. Cold forceps biopsy used to disrupt the ring. 2 cm hiatal hernia. Mild antral gastropathy and normal duodenum. Pathology of the GE junction showed squamocolumnar mucosa with focal goblet cells, no dysplasia or malignancy. Patient's GERD has been somewhat hard to manage and she was switched from Protonix to Nexium more than a year ago. Up until recently this was ineffective in controlling her GERD symptoms.  Patient denies dysphagia. Underwent CT scan of the neck on 07/23/16 for evaluation of palpable mass.  Findings were non-worrisome and included a normal-appearing left submandibular gland as well as a sub-centimeter lymph node.  Beginning about 2 weeks ago she started having intermittent burning chest discomfort she describes as heartburn. This was minimally alleviated with Tums added to her usual regimen of Nexium. Last week she says for about 3 days she had a "stomach flu with nausea, vomiting and diarrhea. She was not vomiting up any blood or coffee ground material. This resolved and she felt better over the weekend. Beginning yesterday the chest burning resumed and after eating some mac & cheese midafternoon, she vomited. Initially there was a smaller amount of blood with a partially digested food. 2 subsequent episodes of emesis produced larger volume of blood. She hasn't vomited since though she does feel nauseated. She has not had  a bowel movement for a couple of days. Though she does feel nauseated, right now she feels like that is coming from migraines. She is asking for some sort of by mouth intake because she says that without by mouth intake her migraines flare. She began having epigastric pain and bilateral lower quadrant cramping.  Hemoglobin at arrival 15, today it is 13.4. This is within normal limits for her baseline which has run from anywhere from 13-13.5 during 2016. She is hypokalemic at 2.9. CXR shows slight increase of interstitial prominence  in the lower lungs bilaterally, possibly representing mild interstitial inflammation.  Patient denies any recent medication changes. She doesn't use NSAIDs or aspirin products. She doesn't smoke tobacco or consume alcoholic beverages. She is compliant with BID Nexium   Past Medical History:  Diagnosis Date  . Allergy   . Anemia   . Chronic bronchitis ([place])    "get it q yr; in the spring time" (02/17/2014)  . Depression    "have taken zoloft since 1996" (02/17/2014)  . Diverticulosis   . Gastritis   . GERD (gastroesophageal reflux disease)   . Grover's disease   . High cholesterol   . Hypertension   . Hypothyroid   . Migraines    "maybe a couple/yr" (02/17/2014)  . Multiple environmental allergies   . Overweight(278.02)   . Peri-menopause   . Schatzki's ring   . Sjogren's disease ([place])   . Systemic lupus ([place])     Past Surgical History:  Procedure Laterality Date  . APPENDECTOMY  1987  . ESOPHAGOGASTRODUODENOSCOPY N/A 02/28/2014   Procedure: ESOPHAGOGASTRODUODENOSCOPY (EGD);  Surgeon: Jerene Bears, MD;  Location: [facility] ENDOSCOPY;  Service: Endoscopy;  Laterality: N/A;  . EXPLORATORY LAPAROTOMY  1987  . FOOT SURGERY Right ~1980   "don't know what they did; had to take something out"  . INGUINAL HERNIA REPAIR Right ~ 2003  . SHOULDER OPEN ROTATOR CUFF REPAIR Left 1995  . SHOULDER SURGERY Left 1997   "cartilage"  . TONSILLECTOMY AND ADENOIDECTOMY  1976  . TUBAL LIGATION  1998   laparoscopic  . VAGINAL HYSTERECTOMY  04/08/2012   menorrhagia    Prior to Admission medications   Medication Sig Start Date End Date Taking? Authorizing Provider  albuterol (PROVENTIL HFA;VENTOLIN HFA) 108 (90 Base) MCG/ACT inhaler Inhale 2 puffs into the lungs every 6 (six) hours as needed for wheezing or shortness of breath. 05/23/16  Yes Elberta Leatherwood, MD  azaTHIOprine (IMURAN) 50 MG tablet Take 100 mg by mouth 2 (two) times daily.  01/26/16  Yes Historical Provider, MD  azelastine (ASTELIN) 0.1 % nasal  spray Place 2 sprays into both nostrils 2 (two) times daily. Use in each nostril as directed 07/13/16  Yes [name], MD  b complex vitamins capsule Take 1 capsule by mouth daily.   Yes Historical Provider, MD  esomeprazole (NEXIUM) 40 MG capsule Take 1 capsule (40 mg total) by mouth 2 (two) times daily before a meal. 12/16/15  Yes Tanda Rockers, MD  fexofenadine (ALLEGRA) 180 MG tablet Take 180 mg by mouth daily.   Yes Historical Provider, MD  hydrochlorothiazide (HYDRODIURIL) 25 MG tablet Take 1 tablet (25 mg total) by mouth daily. 03/13/16  Yes [name], MD  hydroxychloroquine (PLAQUENIL) 200 MG tablet Take 200 mg by mouth 2 (two) times daily.    Yes Historical Provider, MD  levothyroxine (SYNTHROID, LEVOTHROID) 112 MCG tablet Take 1 tablet (112 mcg total) by mouth daily before breakfast. 04/19/16  Yes  [name], MD  Multiple Vitamins-Minerals (BODY/HAIR/SKIN/NAILS) CAPS Take 1 capsule by mouth daily.   Yes Historical Provider, MD  sertraline (ZOLOFT) 100 MG tablet Take one-half tablet by  mouth daily 06/29/16  Yes [name], MD  SUMAtriptan (IMITREX) 100 MG tablet Take 1 tablet (100 mg total) by mouth every 2 (two) hours as needed for migraine. 02/01/16  Yes [name], MD  Vitamin D, Cholecalciferol, 400 UNITS TABS Take 400 Units by mouth daily.   Yes Historical Provider, MD  zolpidem (AMBIEN) 5 MG tablet Take 1 tablet (5 mg total) by mouth at bedtime as needed for sleep. 03/13/16  Yes [name], MD  chlorpheniramine-HYDROcodone (TUSSIONEX) 10-8 MG/5ML SUER Take 5 mLs by mouth every 12 (twelve) hours as needed for cough. Patient not taking: Reported on 08/01/2016 12/16/15   Tanda Rockers, MD  promethazine (PHENERGAN) 12.5 MG tablet Take 1 tablet (12.5 mg total) by mouth every 8 (eight) hours as needed for nausea or vomiting. Patient not taking: Reported on 08/01/2016 03/08/16   [name], MD    Scheduled Meds: . famotidine (PEPCID) IV  20 mg Intravenous Q12H  .  levothyroxine  56 mcg Intravenous Daily  . potassium chloride  10 mEq Intravenous Q1 Hr x 4  . SUMAtriptan  6 mg Subcutaneous Once   Infusions: . dextrose 5 % and 0.9% NaCl 100 mL/hr at 08/01/16 1357   PRN Meds: albuterol, hydrALAZINE, HYDROmorphone (DILAUDID) injection, morphine injection, ondansetron (ZOFRAN) IV   Allergies as of 07/31/2016 - Review Complete 07/31/2016  Allergen Reaction Noted  . Contrast media  [iodinated diagnostic agents] Hives and Itching 03/06/2016  . Other Hives and Swelling 03/06/2016  . Codeine Nausea Only   . Erythromycin Nausea And Vomiting   . Iohexol Hives 02/12/2014    Family History  Problem Relation Age of Onset  . Hypertension Father   . Emphysema Father   . Vision loss Father   . Alcohol abuse Father   . Hypertension Mother   . Heart disease Mother   . Hepatitis Mother     Hep C  . Endometriosis Mother   . Lupus Sister   . Endometriosis Sister   . Breast cancer Maternal Aunt     30's  . Breast cancer Paternal Aunt     22's  . Lung cancer Paternal Uncle     smoker  . Colon cancer Maternal Grandfather   . Uterine cancer Maternal Aunt     50's  . Cervical cancer Paternal Aunt     40's  . Lung cancer Paternal Uncle   . Stomach cancer Neg Hx     Social History   Social History  . Marital status: Single    Spouse name: N/A  . Number of children: N/A  . Years of education: N/A   Occupational History  . Not on file.   Social History Main Topics  . Smoking status: Never Smoker  . Smokeless tobacco: Never Used  . Alcohol use 0.0 oz/week     Comment: 02/17/2014 "might have a few drinks a couple times/yr"  . Drug use: No  . Sexual activity: Not Currently    Birth control/ protection: Abstinence   Other Topics Concern  . Not on file   Social History Narrative   Works at urgent care.     REVIEW OF SYSTEMS: Constitutional:  Generally has stable weight. Some recent weakness and fatigue but this is not a general problem for  her ENT:  No nose bleeds Pulm:  No cough or dyspnea. No pleuritic pain. CV:  No palpitations, no LE edema.  GU:  No hematuria, no frequency GI:  See HPI Heme:  Other than the hematemesis as described above, she is not having unusual bleeding or bruising.   Transfusions:  None Neuro:  No headaches, no peripheral tingling or numbness Derm:  No itching, no rash or sores.  Endocrine:  No sweats or chills.  No polyuria or dysuria Immunization:  Did not discuss. Travel:  None beyond local counties in last few months.    PHYSICAL EXAM: Vital signs in last 24 hours: Vitals:   08/01/16 0533 08/01/16 0804  BP: 121/82 118/67  Pulse: 94 88  Resp: 16 16  Temp: 97.8 F (36.6 C) 97.8 F (36.6 C)   Wt Readings from Last 3 Encounters:  08/01/16 86.9 kg (191 lb 9.6 oz)  07/13/16 89.6 kg (197 lb 9.6 oz)  07/02/16 89.7 kg (197 lb 12 oz)    General: Pleasant, somewhat uncomfortable and unwell-appearing white female. Head:  No facial asymmetry or swelling. No signs of head trauma.  Eyes:  No scleral icterus. No conjunctival pallor.  Eyes are light sensitive, she asked me to turn the overhead light off. Ears:  Not hard of hearing.  Nose:  No congestion or discharge. Mouth:  Mucous membranes are moist and clear. There is no signs of Candida or other exudates. Dentition in good repair. Neck:  No thyromegaly, masses or JVD. Lungs:  Clear to auscultation and percussion bilaterally. No dyspnea. No cough. Heart: RRR. No MRG. S1, S2 present. Abdomen:  Soft. NT. ND. No HSM or masses. No hernias. Bowel sounds quiet but no tinkling or tympanitic bowel sounds.   Rectal: Deferred   Musc/Skeltl: No erythema, swelling or gross deformities of the joints. Extremities:  No CCE.  Neurologic:  Patient is alert. She is oriented 3. No limb weakness or tremor. Skin:  No rashes, telangiectasia or sores.   Psych:  Pleasant, depressed, calm. Cooperative.  Intake/Output from previous day: 10/10 0701 - 10/11  0700 In: 250 [I.V.:250] Out: 0  Intake/Output this shift: Total I/O In: 0  Out: 150 [Urine:150]  LAB RESULTS:  Recent Labs  07/31/16 1942 08/01/16 0112 08/01/16 0441  WBC 8.5 7.4 6.3  HGB 15.0 14.0 13.4  HCT 43.0 40.2 39.5  PLT 334 326 278   BMET Lab Results  Component Value Date   NA 139 08/01/2016   NA 139 07/31/2016   NA 139 08/05/2015   K 2.9 (L) 08/01/2016   K 3.1 (L) 07/31/2016   K 4.0 08/05/2015   CL 101 08/01/2016   CL 102 07/31/2016   CL 106 08/05/2015   CO2 28 08/01/2016   CO2 25 07/31/2016   CO2 26 08/05/2015   GLUCOSE 113 (H) 08/01/2016   GLUCOSE 108 (H) 07/31/2016   GLUCOSE 90 08/05/2015   BUN 14 08/01/2016   BUN 14 07/31/2016   BUN 7 08/05/2015   CREATININE 0.79 08/01/2016   CREATININE 0.82 07/31/2016   CREATININE 0.76 07/20/2016   CALCIUM 9.1 08/01/2016   CALCIUM 9.8 07/31/2016   CALCIUM 8.7 (L) 08/05/2015     RADIOLOGY STUDIES: Dg Chest 2 View  Result Date: 07/31/2016 CLINICAL DATA:  Central chest pain, in the gastric pain with vomiting of blood EXAM: CHEST  2 VIEW COMPARISON:  05/23/2016 CT chest 07/26/2015 FINDINGS: No focal infiltrate or effusion. Mild interstitial prominence within the bilateral lower lung zones, slightly increased compared to prior radiograph.  Heart size normal. Mild atherosclerosis of the aorta. No pneumothorax. IMPRESSION: 1. No focal infiltrate or edema 2. Slight increased interstitial prominence in the bilateral lower lung zones could relate to mild interstitial inflammation. Electronically Signed   By: Donavan Foil M.D.   On: 07/31/2016 20:45

## 2016-08-01 NOTE — H&P (Signed)
[facility] Admission History and Physical Service Pager: [phone]  Patient name: Gina Wise Medical record number: [id number] Date of birth: Oct 27, 1960 Age: 55 y.o. Gender: female  Primary Care Provider: [name], MD Consultants: GI Code Status: Full   Chief Complaint: Hematemesis  Assessment and Plan: Didi Bongard is a 55 y.o. female with a past medical history significant for Lupus, Sjogren's syndrome, GERD, diverticulosis, gastritis, HTN, hypothyroidism, migraines, depression and multiple abdominal surgeries (appendectomy, hysterectomy, inguinal hernia repair) who presented with 3 episodes of hematemesis most likely secondary to complications from GERD.  #Hematemesis, acute Patient presented with 3 episodes of hematemesis. Patient described emesis containing bright red blood but unable to quantify. Patient has a known history of GERD and started on Protonix two years. She has been experiencing worsening heartburn for the past two weeks. Patient endorses some epigastric pain and bilateral lower quadrant cramping. Patient has a history of Schatzki ring which has caused dysphasia in the past, but she does not have one at this time. Patient had an upper endoscopy in 2015 which also showed minimal gastropathy in the antrum of the stomach. Repeat upper endoscopy in 2016 showed ulcerated and severe acute gastritis in the gastric antrum and pylorus. Multiple biopsies were taken in the gastric body and antrum and were negative for Helicobacter pylori and intestinal metaplasia. Biopsy samples taken from the GE junction were unremarkable for Barrett's esophagus. Patient was started the Nexium by GI, and has been taking medication as prescribed with minimal relief. Patient hemoglobin is stable at was 15 on arrival but after fluid hydration, trended down to 14.   She remains hemodynamically stable with no more hematemesis of nausea when interviewed. Hematemesis is most  likely secondary to worsening gastric ulceration vs esophageal injury secondary to emesis vs bleeding varices although patient does not have a history of liver disease. --Admit to [place], admitting physician Dr.Neal --Make patient NPO, pending AM GI consult and possible procedure - D5 NS IVF --Consult [facility] in the am, patient previously scoped in 2015, 2016. --Start Dilaudid 0.5mg  IV q4 prn --Start IVF D5NS -- hold pharm, DVT ppx --Continue Pepcid 20 mg BID --Zofran 4 mg IV q6 --Follow-up on a.m. CBC and BMP  # Lupus / Sjogren's , stable Patient is followed by Rheumatology --Will Hold plaquenil and Imuran while NPO  --Resume after GI consult and stability of bleed  # GERD, chronic Patient with a history of Schatzki's ring which has required dilatation in the past. No evidence of dysphagia at this time --Will start patient on IV Pepcid 20 mg BID --Hold Home Nexium 40 mg BID  #Pulmonary Fibrosis, chronic Patient recently seen by Dr.Marshall her pulmonologist at [facility] (02/2016) and had PFTs and exercise oxygen titration done. Assessment for supplemental oxygen was done and patient did not require any, with some dyspnea noted during exercise but no desaturation. Stable --Continue Albuterol 2 puff q6 prn  # Hypothyroidism, stable - recent TSH > 1.08. Well controlled on current regimen --Hold synthroid while NPO  # HTN, chronic  BP on admission was 136/86.  -- Hold while NPO will resume HCTZ 25mg  daily after GI consult - hydralazine IV PRN  # Depression, stable Continue home regimen when taking PO -- Zoloft 50mg  qd.   # Migraine Headaches, chronic, stable -- Hold Imitrex 100 mg q2 prn.   FEN/GI: , NPO, D5NS 100 cc/hr Prophylaxis: Hold pharmacologic DVT ppx given concern for active bleed, continue SCDs, Pepcid 20 mg BID  Disposition: Admit  for inpatient workup of hematemesis  History of Present Illness:  Gina Wise is a 55 y.o. female with a past medical history  significant forLupus, Sjogren's syndrome, GERD, diverticulosis, gastritis, HTN, HLD hypothyroidism, migraines, depression who presented to the ED with 3 episodes of hematemesis. According to patient, she has been experiencing heartburns for the past 2 weeks. Patient has been taking her Nexium as prescribed, but endorsed minimal relief of her heartburn on current regimen. Today, patient had 3 episodes of hematemesis. She describes the first one as mostly food with some frank blood. Second emesis was mostly frank blood with some food. Third episode was only frank blood without any food mixed in. Patient has no prior history of hematemesis. Patient endorses some nausea with episodes of emesis. Patient denies diarrhea, melena, dizziness and fatigue. Patient has history of GERD and Schatzki ring which has required some dilation in the past. Patient had an endoscopy in July 2016 which showed ulceration and severe gastritis in the antrum and body of the stomach. Biopsies have shown no evidence of H. pylori or Barrett's esophagus. In the ED, patient received fentanyl for pain control and was started on Pepcid and IV fluid. Hemoglobin was stable at 15, and patient has been hemodynamically stable since admission.  Review Of Systems: Per HPI with the following additions:  Review of Systems  Constitutional: Negative.   HENT: Negative.   Eyes: Negative.   Respiratory: Positive for cough.   Cardiovascular: Negative.   Gastrointestinal: Positive for abdominal pain, heartburn, nausea and vomiting.  Genitourinary: Negative.   Musculoskeletal: Negative.   Skin: Negative.   Neurological: Negative.   Endo/Heme/Allergies: Negative.   Psychiatric/Behavioral: Negative.     Patient Active Problem List   Diagnosis Date Noted  . Hematemesis 08/01/2016  . Unsteadiness on feet 07/13/2016  . Allergic rhinitis 07/13/2016  . Pain in the chest 05/23/2016  . Sinusitis 05/03/2016  . Lentigo 02/17/2016  . Skin lesion  02/17/2016  . Lupus (systemic lupus erythematosus) ([employer]) 02/17/2016  . Upper airway cough syndrome 12/16/2015  . Epistaxis 12/01/2015  . Interstitial lung disease due to connective tissue disease ([place]) 11/07/2015  . Frontal sinus pain 11/01/2015  . Hair loss 08/19/2015  . CAP (community acquired pneumonia) 08/03/2015  . Shortness of breath   . Lupus   . Sjogren's disease ([place])   . Essential hypertension   . Other specified hypothyroidism   . Lung disease with systemic lupus erythematosus ([place]) 07/14/2015  . Rash and nonspecific skin eruption 05/20/2015  . Bruising 05/10/2015  . Pain in joint, multiple sites 05/10/2015  . Shingles 02/16/2015  . 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 Allergic conjunctivitis 12/13/2014  . Throat clearing 08/10/2014  . Hyperlipidemia 08/10/2014  . Medication side effect 03/30/2014  . Gastroparesis 03/11/2014  . Schatzki's ring 03/09/2014  . Unspecified constipation 02/23/2014  . GERD (gastroesophageal reflux disease) 02/23/2014  . Goiter 07/03/2013  . Cervical lymphadenopathy 06/15/2013  . Depression with anxiety 06/15/2013  . Cough 01/02/2013  . Headache(784.0) 10/24/2012  . Iron deficiency 03/13/2012  . Fatigue 03/12/2012  . Dysfunctional uterine bleeding 01/02/2012  . Insomnia 10/03/2011  . Dyslipidemia 09/03/2011  . Neck pain, chronic 09/03/2011  . Overweight 01/03/2011  . 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 Migraine 10/04/2009  . Hypothyroidism 09/09/2009  . Essential hypertension, benign 09/09/2009    Past Medical History: Past Medical History:  Diagnosis Date  . Allergy   . Anemia   . Chronic bronchitis ([place])    "get it q  yr; in the spring time" (02/17/2014)  . Depression    "have taken zoloft since 1996" (02/17/2014)  . Diverticulosis   . Gastritis   . GERD (gastroesophageal reflux disease)   . Grover's disease   . High cholesterol   . Hypertension   . Hypothyroid   . Migraines    "maybe a couple/yr"  (02/17/2014)  . Multiple environmental allergies   . Overweight(278.02)   . Peri-menopause   . Schatzki's ring   . Sjogren's disease ([place])   . Systemic lupus ([place])     Past Surgical History: Past Surgical History:  Procedure Laterality Date  . APPENDECTOMY  1987  . ESOPHAGOGASTRODUODENOSCOPY N/A 02/28/2014   Procedure: ESOPHAGOGASTRODUODENOSCOPY (EGD);  Surgeon: Jerene Bears, MD;  [facility] END[facility]COPY;  Service: Endoscopy;  Laterality: N/A;  . EXPLORATORY LAPAROTOMY  1987  . FOOT SURGERY Right ~1980   "don't know what they did; had to take something out"  . INGUINAL HERNIA REPAIR Right ~ 2003  . SHOULDER OPEN ROTATOR CUFF REPAIR Left 1995  . SHOULDER SURGERY Left 1997   "cartilage"  . TONSILLECTOMY AND ADENOIDECTOMY  1976  . TUBAL LIGATION  1998   laparoscopic  . VAGINAL HYSTERECTOMY  04/08/2012   menorrhagia    Social History: Social History  Substance Use Topics  . Smoking status: Never Smoker  . Smokeless tobacco: Never Used  . Alcohol use 0.0 oz/week     Comment: 02/17/2014 "might have a few drinks a couple times/yr"   Additional social history:  Please also refer to relevant sections of EMR.  Family History: Family History  Problem Relation Age of Onset  . Hypertension Father   . Emphysema Father   . Vision loss Father   . Alcohol abuse Father   . Hypertension Mother   . Heart disease Mother   . Hepatitis Mother     Hep C  . Endometriosis Mother   . Lupus Sister   . Endometriosis Sister   . Breast cancer Maternal Aunt     30's  . Breast cancer Paternal Aunt     86's  . Lung cancer Paternal Uncle     smoker  . Colon cancer Maternal Grandfather   . Uterine cancer Maternal Aunt     50's  . Cervical cancer Paternal Aunt     40's  . Lung cancer Paternal Uncle   . Stomach cancer Neg Hx    (If not completed, MUST add something in)  Allergies and Medications: Allergies  Allergen Reactions  . Contrast Media  [Iodinated Diagnostic Agents] Hives and  Itching  . Other Hives and Swelling  . Codeine Nausea Only  . Erythromycin Nausea And Vomiting    REACTION: Nausea Severe abdominal pain  . Iohexol Hives   Current Facility-Administered Medications on File Prior to Encounter  Medication Dose Route Frequency Provider Last Rate Last Dose  . 0.9 %  sodium chloride infusion   Intravenous Continuous [name], MD 150 mL/hr at 02/16/14 1213     Current Outpatient Prescriptions on File Prior to Encounter  Medication Sig Dispense Refill  . albuterol (PROVENTIL HFA;VENTOLIN HFA) 108 (90 Base) MCG/ACT inhaler Inhale 2 puffs into the lungs every 6 (six) hours as needed for wheezing or shortness of breath. 1 Inhaler 0  . azaTHIOprine (IMURAN) 50 MG tablet Take 100 mg by mouth 2 (two) times daily.     Marland Kitchen azelastine (ASTELIN) 0.1 % nasal spray Place 2 sprays into both nostrils 2 (two) times daily. Use  in each nostril as directed 30 mL 12  . b complex vitamins capsule Take 1 capsule by mouth daily.    Marland Kitchen esomeprazole (NEXIUM) 40 MG capsule Take 1 capsule (40 mg total) by mouth 2 (two) times daily before a meal. 60 capsule 11  . fexofenadine (ALLEGRA) 180 MG tablet Take 180 mg by mouth daily.    . hydrochlorothiazide (HYDRODIURIL) 25 MG tablet Take 1 tablet (25 mg total) by mouth daily. 90 tablet 1  . hydroxychloroquine (PLAQUENIL) 200 MG tablet Take 200 mg by mouth 2 (two) times daily.     Marland Kitchen levothyroxine (SYNTHROID, LEVOTHROID) 112 MCG tablet Take 1 tablet (112 mcg total) by mouth daily before breakfast. 90 tablet 1  . Multiple Vitamins-Minerals (BODY/HAIR/SKIN/NAILS) CAPS Take 1 capsule by mouth daily.    . sertraline (ZOLOFT) 100 MG tablet Take one-half tablet by  mouth daily 45 tablet 1  . SUMAtriptan (IMITREX) 100 MG tablet Take 1 tablet (100 mg total) by mouth every 2 (two) hours as needed for migraine. 20 tablet 1  . Vitamin D, Cholecalciferol, 400 UNITS TABS Take 400 Units by mouth daily.    Marland Kitchen zolpidem (AMBIEN) 5 MG tablet Take 1 tablet (5 mg  total) by mouth at bedtime as needed for sleep. 30 tablet 0  . chlorpheniramine-HYDROcodone (TUSSIONEX) 10-8 MG/5ML SUER Take 5 mLs by mouth every 12 (twelve) hours as needed for cough. (Patient not taking: Reported on 08/01/2016) 473 mL 0  . promethazine (PHENERGAN) 12.5 MG tablet Take 1 tablet (12.5 mg total) by mouth every 8 (eight) hours as needed for nausea or vomiting. (Patient not taking: Reported on 08/01/2016) 60 tablet 0    Objective: BP 140/87 (BP Location: Right Arm)   Pulse 91   Temp 97.9 F (36.6 C) (Oral)   Resp 20   Ht 5\' 6"  (1.676 m)   Wt 197 lb (89.4 kg)   LMP 04/03/2012   SpO2 96%   BMI 31.80 kg/m  Exam: Physical Exam: General Appearance:  Pleasant woman appears a bit fatigued but in no acute distress and cooperative Head/face:  NCAT Eyes:  PERRL and EOMI Mouth/Throat:  Mucosa moist, no lesions; pharynx without erythema, edema or exudate Neck:  Supple, no mass, non-tender, no bruits, no jvd, Adenopathy- absent Lungs:  Normal expansion.  Clear to auscultation.  No rales, rhonchi, or wheezing. Heart: Normal S1, S2 .  Regular rate and rhythm without murmur, gallop or rub. Abdomen:  Soft, Non distended, non  tender to palpation in the epigastric and lower quadrants bilaterally, normal bowel sounds; no bruits, organomegaly or masses.  Musculoskeletal:  Spine range of motion normal. Muscular strength intact. Neurologic:  Alert and oriented x 3, reflexes normal and symmetric, strength and  sensation grossly normal  Labs and Imaging: CBC BMET   Recent Labs Lab 08/01/16 0112  WBC 7.4  HGB 14.0  HCT 40.2  PLT 326    Recent Labs Lab 07/31/16 1942  NA 139  K 3.1*  CL 102  CO2 25  BUN 14  CREATININE 0.82  GLUCOSE 108*  CALCIUM 9.8     Dg Chest 2 View  Result Date: 07/31/2016 CLINICAL DATA:  Central chest pain, in the gastric pain with vomiting of blood EXAM: CHEST  2 VIEW COMPARISON:  05/23/2016 CT chest 07/26/2015 FINDINGS: No focal infiltrate or  effusion. Mild interstitial prominence within the bilateral lower lung zones, slightly increased compared to prior radiograph. Heart size normal. Mild atherosclerosis of the aorta. No pneumothorax. IMPRESSION: 1. No focal  infiltrate or edema 2. Slight increased interstitial prominence in the bilateral lower lung zones could relate to mild interstitial inflammation. Electronically Signed   By: Donavan Foil M.D.   On: 07/31/2016 20:45    Marjie Skiff, MD 08/01/2016, 1:40 AM PGY-1, [place] Intern pager: [phone], text pages welcome  I have seen and examined the patient. I have read and agree with the above note. My changes are noted in blue.  Parsa Rickett A. Lincoln Brigham MD, [facility] Resident PGY-2 Pager [phone]

## 2016-08-01 NOTE — ED Notes (Signed)
Attempted x2 to start peripheral IV access but failed due to minute/poorly visible veins.

## 2016-08-02 ENCOUNTER — Observation Stay (HOSPITAL_COMMUNITY): Payer: 59 | Admitting: Anesthesiology

## 2016-08-02 ENCOUNTER — Encounter (HOSPITAL_COMMUNITY): Payer: Self-pay

## 2016-08-02 ENCOUNTER — Encounter (HOSPITAL_COMMUNITY)
Admission: EM | Disposition: A | Discharge status: Home / Self Care | Payer: Self-pay | Source: Home / Self Care | Attending: Emergency Medicine

## 2016-08-02 ENCOUNTER — Ambulatory Visit (HOSPITAL_COMMUNITY): Payer: 59

## 2016-08-02 DIAGNOSIS — K253 Acute gastric ulcer without hemorrhage or perforation: Secondary | ICD-10-CM | POA: Diagnosis not present

## 2016-08-02 DIAGNOSIS — M358 Other specified systemic involvement of connective tissue: Secondary | ICD-10-CM

## 2016-08-02 DIAGNOSIS — E039 Hypothyroidism, unspecified: Secondary | ICD-10-CM

## 2016-08-02 DIAGNOSIS — K920 Hematemesis: Secondary | ICD-10-CM | POA: Diagnosis not present

## 2016-08-02 DIAGNOSIS — J8489 Other specified interstitial pulmonary diseases: Secondary | ICD-10-CM

## 2016-08-02 DIAGNOSIS — K219 Gastro-esophageal reflux disease without esophagitis: Secondary | ICD-10-CM

## 2016-08-02 DIAGNOSIS — R110 Nausea: Secondary | ICD-10-CM

## 2016-08-02 HISTORY — PX: ESOPHAGOGASTRODUODENOSCOPY: SHX5428

## 2016-08-02 LAB — BASIC METABOLIC PANEL
ANION GAP: 10 (ref 5–15)
BUN: 6 mg/dL (ref 6–20)
CALCIUM: 8.9 mg/dL (ref 8.9–10.3)
CO2: 22 mmol/L (ref 22–32)
CREATININE: 0.65 mg/dL (ref 0.44–1.00)
Chloride: 109 mmol/L (ref 101–111)
GFR calc non Af Amer: >60 mL/min (ref >60)
Glucose, Bld: 94 mg/dL (ref 65–99)
Potassium: 3.4 mmol/L — ABNORMAL LOW (ref 3.5–5.1)
SODIUM: 141 mmol/L (ref 135–145)

## 2016-08-02 LAB — HEMOGLOBIN AND HEMATOCRIT, BLOOD
HEMATOCRIT: 41.5 % (ref 36.0–46.0)
HEMOGLOBIN: 13.9 g/dL (ref 12.0–15.0)

## 2016-08-02 SURGERY — EGD (ESOPHAGOGASTRODUODENOSCOPY)
Anesthesia: Monitor Anesthesia Care

## 2016-08-02 MED ORDER — SODIUM CHLORIDE 0.9 % IV SOLN: INTRAVENOUS | Status: DC

## 2016-08-02 MED ORDER — BUTAMBEN-TETRACAINE-BENZOCAINE 2-2-14 % EX AERO
INHALATION_SPRAY | CUTANEOUS | Status: DC | PRN
  Administered 2016-08-02: 2 via TOPICAL

## 2016-08-02 MED ORDER — LIDOCAINE HCL (CARDIAC) 20 MG/ML IV SOLN
INTRAVENOUS | Status: DC | PRN
  Administered 2016-08-02: 40 mg via INTRATRACHEAL

## 2016-08-02 MED ORDER — SUCRALFATE 1 G PO TABS
1.0000 g | ORAL_TABLET | Freq: Three times a day (TID) | ORAL | Status: DC
  Administered 2016-08-02 – 2016-08-03 (×4): 1 g via ORAL
  Filled 2016-08-02 (×4): qty 1

## 2016-08-02 MED ORDER — FENTANYL CITRATE (PF) 100 MCG/2ML IJ SOLN: 25.0000 ug | INTRAMUSCULAR | Status: DC | PRN

## 2016-08-02 MED ORDER — LACTATED RINGERS IV SOLN
INTRAVENOUS | Status: DC | PRN
  Administered 2016-08-02: 14:00:00 via INTRAVENOUS

## 2016-08-02 MED ORDER — PROMETHAZINE HCL 25 MG/ML IJ SOLN: 6.2500 mg | INTRAMUSCULAR | Status: DC | PRN

## 2016-08-02 MED ORDER — PROPOFOL 500 MG/50ML IV EMUL
INTRAVENOUS | Status: DC | PRN
  Administered 2016-08-02: 125 ug/kg/min via INTRAVENOUS

## 2016-08-02 MED ORDER — PROPOFOL 10 MG/ML IV BOLUS
INTRAVENOUS | Status: DC | PRN
  Administered 2016-08-02 (×3): 20 mg via INTRAVENOUS

## 2016-08-02 MED ORDER — PANTOPRAZOLE SODIUM 40 MG PO TBEC
40.0000 mg | DELAYED_RELEASE_TABLET | Freq: Every day | ORAL | Status: DC
  Administered 2016-08-03: 40 mg via ORAL
  Filled 2016-08-02: qty 1

## 2016-08-02 NOTE — Anesthesia Preprocedure Evaluation (Signed)
Anesthesia Evaluation  Patient identified by MRN, date of birth, ID band Patient awake    Reviewed: Allergy & Precautions, NPO status , Patient's Chart, lab work & pertinent test results  Airway Mallampati: II  TM Distance: >3 FB Neck ROM: Full    Dental  (+) Teeth Intact, Dental Advisory Given   Pulmonary  Pulmonary fibrosis    Pulmonary exam normal breath sounds clear to auscultation       Cardiovascular hypertension, Pt. on medications Normal cardiovascular exam Rhythm:Regular Rate:Normal     Neuro/Psych  Headaches, PSYCHIATRIC DISORDERS Depression    GI/Hepatic Neg liver ROS, GERD  Medicated,nausea   Endo/Other  Hypothyroidism Obesity Lupus Sjogren's  Renal/GU negative Renal ROS     Musculoskeletal negative musculoskeletal ROS (+)   Abdominal   Peds  Hematology negative hematology ROS (+)   Anesthesia Other Findings Day of surgery medications reviewed with the patient.  Reproductive/Obstetrics                             Anesthesia Physical Anesthesia Plan  ASA: III  Anesthesia Plan: MAC   Post-op Pain Management:    Induction: Intravenous  Airway Management Planned: Nasal Cannula  Additional Equipment:   Intra-op Plan:   Post-operative Plan:   Informed Consent: I have reviewed the patients History and Physical, chart, labs and discussed the procedure including the risks, benefits and alternatives for the proposed anesthesia with the patient or authorized representative who has indicated his/her understanding and acceptance.   Dental advisory given  Plan Discussed with: CRNA and Anesthesiologist  Anesthesia Plan Comments: (Discussed risks/benefits/alternatives to MAC sedation including need for ventilatory support, hypotension, need for conversion to general anesthesia.  All patient questions answered.  Patient/guardian wishes to proceed.)        Anesthesia  Quick Evaluation

## 2016-08-02 NOTE — Progress Notes (Signed)
Paged and received call from Dr. Ree Kida to request Md to visit patient due to patient upset at having to "waste a day in the hospital" waiting on the GI consult. Dr. Ree Kida stated that he would attempt to speak with patient to provide service recovery regarding the miscommunication. Gina Wise, Gina Wise

## 2016-08-02 NOTE — Op Note (Addendum)
[facility] Patient Name: Gina Wise Procedure Date : 08/02/2016 MRN: [id number] Attending MD: Gatha Mayer , MD Date of Birth: 09/04/1961 CSN: [id number] Age: 55 Admit Type: Inpatient Procedure:                Upper GI endoscopy Indications:              Heartburn, Hematemesis Providers:                Gatha Mayer, MD, Zenon Mayo, RN, Cherylynn Ridges, Technician, Gershon Crane, CRNA Referring MD:              Medicines:                Monitored Anesthesia Care Complications:            No immediate complications. Estimated Blood Loss:     Estimated blood loss was minimal. Procedure:                Pre-Anesthesia Assessment:                           - Prior to the procedure, a History and Physical                            was performed, and patient medications and                            allergies were reviewed. The patient's tolerance of                            previous anesthesia was also reviewed. The risks                            and benefits of the procedure and the sedation                            options and risks were discussed with the patient.                            All questions were answered, and informed consent                            was obtained. Prior Anticoagulants: The patient has                            taken no previous anticoagulant or antiplatelet                            agents. ASA Grade Assessment: III - A patient with                            severe systemic disease. After reviewing the risks  and benefits, the patient was deemed in                            satisfactory condition to undergo the procedure.                           After obtaining informed consent, the endoscope was                            passed under direct vision. Throughout the                            procedure, the patient's blood pressure, pulse, and   oxygen saturations were monitored continuously. The                            [id number]) scope was introduced through the                            mouth, and advanced to the second part of duodenum.                            The upper GI endoscopy was accomplished without                            difficulty. The patient tolerated the procedure                            well. Scope In: Scope Out: Findings:      A non-obstructing Schatzki ring (acquired) was found at the       gastroesophageal junction.      A small hiatal hernia was present.      A few localized, diminutive bleeding erosions were found at the pylorus.      The exam was otherwise without abnormality.      The cardia and gastric fundus were normal on retroflexion. Impression:               - Non-obstructing Schatzki ring.                           - Small hiatal hernia.                           - Bleeding erosive gastropathy.                           - The examination was otherwise normal.                           - No specimens collected.                           -                           SMALL EROSIONS IN PYLORIC CHANNEL ONLY NOTICED  AFTER SCOPE PASSAGE CAUSED SOME CONTACT BLEEDING                            (MINIOR) Moderate Sedation:      Please see anesthesia notes, moderate sedation not given Recommendation:           - Return patient to hospital ward for possible                            discharge same day.                           - Use sucralfate tablets 1 gram PO QID.                           -                           WOULD SEND HOME SOON - ADD CARAFATE TO SEE IF IT                            HELPS HER SXS - RESTART PPI - TAKE NEXIUM AGAIN AT                            HOME                           NOTE SHE HAS HAD HX OF FAILED OTHER PPI SO ? IF SXS                            ARE NOT ACTUALLY GERD BUT A FUNCTIONAL GI PROCESS -                             HEMATEMESIS WAS SACNT BY HISTORY AND ALSO HGB HAS                            STAYED NL SO DO NOT THINK ANYTHING BAD OCCURRING -                            HAD GASTROENTERITIS LAST WEEK SO COULD HAVE GASTRIC                            MOTILITY DYSFUNCTION RELATED TO THAT - SHOULD                            IMPROVE WITH TIME                           NUMEROUS POSSIBLE MEDICATION SIDE EFFECTS ALSO                           SHE DOES NOT NEED A GI APPT AFTER DC FOR THIS IF  TREATMENT HELSP BUT IF NOT GETTING ADEQUATE RELIEF                            SHE CAN CALL OFFICE TO GET F/U WITH DR. PYRTLE                           - Resume previous diet.                           - Continue present medications. Procedure Code(s):        --- Professional ---                           [phone], Esophagogastroduodenoscopy, flexible,                            transoral; diagnostic, including collection of                            specimen(s) by brushing or washing, when performed                            (separate procedure) Diagnosis Code(s):        --- Professional ---                           K22.2, Esophageal obstruction                           K44.9, Diaphragmatic hernia without obstruction or                            gangrene                           K31.89, Other diseases of stomach and duodenum                           K92.2, Gastrointestinal hemorrhage, unspecified                           R12, Heartburn                           K92.0, Hematemesis CPT copyright 2016 [employer]. All rights reserved. The codes documented in this report are preliminary and upon coder review may  be revised to meet current compliance requirements. Gatha Mayer, MD 08/02/2016 2:58:59 PM This report has been signed electronically. Number of Addenda: 0

## 2016-08-02 NOTE — Progress Notes (Signed)
Family Medicine Teaching Service Daily Progress Note Intern Pager: [phone]  Patient name: Gina Wise Medical record number: [id number] Date of birth: [birth year] Age: 55 y.o. Gender: female  Primary Care Provider: [name], MD Consultants: Gastroenterology Code Status: Full  Pt Overview and Major Events to Date:  10/11: GI Consult  10/12: Endoscopy  Assessment and Plan: Gina Wise is a 55 y.o. female with a past medical history significant for Lupus, Sjogren's syndrome, GERD, diverticulosis, gastritis, HTN, hypothyroidism, migraines, depression and multiple abdominal surgeries (appendectomy, hysterectomy, inguinal hernia repair) who presented with 3 episodes of hematemesis most likely secondary to complications from GERD.  #Hematemesis, resolved Patient presented with 3 episodes of hematemesis. Patient has a history of Schatzki ring which has caused dysphasia in the past, but she does not have one at this time. Repeat upper endoscopy in 2016 showed ulcerated and severe acute gastritis in the gastric antrum and pylorus. Biopsy samples taken from the GE junction were unremarkable for Barrett's esophagus. Patient seen by GI on 10/11, who recommended upper endoscopy on 10/12.  -- Patient NPO -- Continue D5 NS IVF --Schedule for upper endoscopy today 2pm --Continue Dilaudid 0.5mg  IV q4 prn --Continue Pepcid 20 mg BID --Zofran 4 mg IV q6 --Follow-up on a.m. CBC and BMP  # Lupus/Sjogren's, stable Patient is followed by Rheumatology --Will Hold plaquenil and Imuran while NPO  --Resume after GI consult and stability of bleed  # GERD, chronic Patient with a history of Schatzki's ring which has required dilatation in the past. No evidence of dysphagia at this time --Continue IV Pepcid 20 mg BID --Hold Home Nexium 40 mg BID  #Pulmonary Fibrosis, chronic Patient recently seen by Dr.Marshall her pulmonologist at [facility] (02/2016) and had PFTs and exercise oxygen titration done.  Assessment for supplemental oxygen was done and patient did not require any, with some dyspnea noted during exercise but no desaturation. Stable --Continue Albuterol 2 puff q6 prn  # Hypothyroidism, stable Most recent TSH >1.08. Well controlled on current regimen --Hold synthroid while NPO  # HTN, chronic  -- Hold while NPO will resume HCTZ 25mg  daily after GI consult -- Hydralazine IV PRN  # Depression, stable Continue home regimen when taking PO -- Zoloft 50mg  qd.   # Migraine Headaches, chronic, stable -- Hold Imitrex 100 mg q2 prn.   FEN/GI: , NPO, D5NS 100 cc/hr Prophylaxis: SCDs, Pepcid 20 mg BID  Disposition: Admit to inpatient service pending further workup for hematemesis  Subjective:  No acute events overnight. Patient continue to endorses nausea with po intake. Patient had a migraines overnight and was given imitrex. Patient lost IV overnight and had a swollen arm, edema completely resolved this morning.  Objective: Temp:  [97.4 F (36.3 C)-97.8 F (36.6 C)] 97.5 F (36.4 C) (10/12 0548) Pulse Rate:  [84-90] 87 (10/12 0548) Resp:  [16-19] 19 (10/12 0548) BP: (118-133)/(67-73) 125/67 (10/12 0548) SpO2:  [96 %-98 %] 98 % (10/12 0548) Weight:  [191 lb 5.8 oz (86.8 kg)] 191 lb 5.8 oz (86.8 kg) (10/11 2039)   Physical Exam: General Appearance:  Pleasant woman, mildly anxious but in no acute distress and cooperative. Head/face:  NCAT Eyes:  PERRL and EOMI Mouth/Throat:  Mucosa moist, no lesions; pharynx without erythema, edema or exudate Neck:  Supple, no mass, non-tender, no bruits, no jvd, Adenopathy- absent Lungs:  Normal expansion.  Clear to auscultation.  No rales, rhonchi, or wheezing. Heart: Normal S1, S2 .  Regular rate and rhythm without murmur, gallop or rub. Abdomen:  Soft, non distended, non tender to palpation in the epigastric and lower quadrants bilaterally, normal bowel sounds; no bruits, organomegaly or masses.  Musculoskeletal:  Spine range  of motion normal. Muscular strength intact. Neurologic:  Alert and oriented x 3, reflexes normal and symmetric, strength and  sensation grossly normal  Laboratory:  Recent Labs Lab 07/31/16 1942 08/01/16 0112 08/01/16 0441 08/02/16 0440  WBC 8.5 7.4 6.3  --   HGB 15.0 14.0 13.4 13.9  HCT 43.0 40.2 39.5 41.5  PLT 334 326 278  --     Recent Labs Lab 07/31/16 1942 08/01/16 0441 08/02/16 0440  NA 139 139 141  K 3.1* 2.9* 3.4*  CL 102 101 109  CO2 25 28 22   BUN 14 14 6   CREATININE 0.82 0.79 0.65  CALCIUM 9.8 9.1 8.9  GLUCOSE 108* 113* 94    Imaging/Diagnostic Tests: No results found.  Marjie Skiff, MD 08/02/2016, 7:57 AM PGY-1, [place] Intern pager: [phone], text pages welcome

## 2016-08-02 NOTE — Transfer of Care (Signed)
Immediate Anesthesia Transfer of Care Note  Patient: Gina Wise  Procedure(s) Performed: Procedure(s): ESOPHAGOGASTRODUODENOSCOPY (EGD) (N/A)  Patient Location: PACU and Endoscopy Unit  Anesthesia Type:MAC  Level of Consciousness: awake, alert  and oriented  Airway & Oxygen Therapy: Patient Spontanous Breathing and Patient connected to nasal cannula oxygen  Post-op Assessment: Report given to RN, Post -op Vital signs reviewed and stable and Patient moving all extremities  Post vital signs: Reviewed and stable  Last Vitals:  Vitals:   08/02/16 1455 08/02/16 1505  BP: (!) 127/57 (!) 143/98  Pulse: 86 88  Resp: 13 (!) 22  Temp:      Last Pain:  Vitals:   08/02/16 1446  TempSrc: Oral  PainSc:          Complications: No apparent anesthesia complications

## 2016-08-02 NOTE — Anesthesia Postprocedure Evaluation (Signed)
Anesthesia Post Note  Patient: Gina Wise  Procedure(s) Performed: Procedure(s) (LRB): ESOPHAGOGASTRODUODENOSCOPY (EGD) (N/A)  Patient location during evaluation: Endoscopy Anesthesia Type: MAC Level of consciousness: awake and alert Pain management: pain level controlled Vital Signs Assessment: post-procedure vital signs reviewed and stable Respiratory status: spontaneous breathing, nonlabored ventilation, respiratory function stable and patient connected to nasal cannula oxygen Cardiovascular status: stable and blood pressure returned to baseline Anesthetic complications: no    Last Vitals:  Vitals:   08/02/16 1455 08/02/16 1505  BP: (!) 127/57 (!) 143/98  Pulse: 86 88  Resp: 13 (!) 22  Temp:      Last Pain:  Vitals:   08/02/16 1446  TempSrc: Oral  PainSc:                  Catalina Gravel

## 2016-08-02 NOTE — Progress Notes (Signed)
Pt back from Endoscopy. Arrived by wheelchair. Pts diet has now been advanced to a soft regular and she is ordering her meal in her room. Pt is not in pain and sitting in chair.

## 2016-08-02 NOTE — Progress Notes (Signed)
Pt left for Endoscopy with Transport.

## 2016-08-03 ENCOUNTER — Encounter (HOSPITAL_COMMUNITY): Payer: Self-pay | Admitting: Internal Medicine

## 2016-08-03 MED ORDER — SUCRALFATE 1 G PO TABS: 1.0000 g | ORAL_TABLET | Freq: Three times a day (TID) | ORAL | 1 refills | Status: DC

## 2016-08-03 MED ORDER — ENSURE ENLIVE PO LIQD: 237.0000 mL | Freq: Three times a day (TID) | ORAL | Status: DC

## 2016-08-03 MED ORDER — POTASSIUM CHLORIDE CRYS ER 20 MEQ PO TBCR
30.0000 meq | EXTENDED_RELEASE_TABLET | Freq: Once | ORAL | Status: AC
  Administered 2016-08-03: 30 meq via ORAL
  Filled 2016-08-03: qty 1

## 2016-08-03 NOTE — Progress Notes (Signed)
Patient Discharge: Disposition: Patient discharged to home accompanied by the staff and son. Education: Reviewed follow-up appointments, medications, prescriptions, and discharge instructions, understood and acknowledged.  Patient provided education about boost nutritional supplements, small frequent meals, exercise and also pt demonstrated understanding about bowel motility, pt stated Miralax at home and takes when needed, also educated about taking more liquids. IV: Discontinued IV before discharge. Telemetry:  Discontinued Tele before discharge, CCMD notified. Transportation: Patient escorted out of the unit in w/c accompanied by her son. Belongings: Patient took all her belongings with her.

## 2016-08-03 NOTE — Progress Notes (Signed)
08/01/2016 1:15 PM (Late Entry)  Patient expressed concerns to attending RN Lambert Keto that she has not eaten in now over 24 hours and no one has rounded on her yet from the GI Team.   MD Ree Kida stated that GI has been consulted and the patient needs to remain NPO.   Called [facility] reception to inquire about when that patient could expect to see a MD. Per receptionist, the patient was not on their rounding list and had no active consult at the time. This RN initiated another consult and was informed that MD Outlaw from GI was on call. Receptionist stated someone who should be rounding on her shortly. Paged MD Ree Kida to inform, no response.   Informed patient that we had spoken to GI and someone who round on her shortly. Updated attending RN Lambert Keto. Department Director made aware as well.   Lenell Mcconnell [employer], RN-BC, [employer] Phone [phone]

## 2016-08-03 NOTE — Discharge Summary (Signed)
[facility] Discharge Summary  Patient name: Gina Wise Medical record number: [id number] Date of birth: 12-25-1960 Age: 55 y.o. Gender: female Date of Admission: 08/01/2016  Date of Discharge:08/03/2016 Admitting Physician: [name], MD  Primary Care Provider: [name], MD Consultants: Gastroenterology  Indication for Hospitalization: Hematemesis  Discharge Diagnoses/Problem List:  Hematemesis  Disposition: Home  Discharge Condition: Stable  Discharge Exam:  General Appearance: Patient in no acute distress and cooperative Head/face:  NCAT Eyes:  PERRL and EOMI Mouth/Throat:  Mucosa moist, no lesions; pharynx without erythema, edema or exudate. Neck:  Supple, no mass, non-tender, no bruits, no jvd, Adenopathy- absent Lungs:  Normal expansion.  Clear to auscultation.  No rales, rhonchi, or wheezing. Heart:   Normal S1, S2 .  Regular rate and rhythm without murmur, gallop or rub. Abdomen:  Soft, non-tender, normal bowel sounds; no bruits, organomegaly or masses. Skin:  Skin color, texture, turgor are normal; there are no bruises, rashes or lesions. Extremities: Extremities warm to touch, pink, with no edema. and pulses present in all extremities  Neurologic:  Alert and oriented x 3, gait normal., reflexes normal and symmetric, strength and  sensation grossly normal  Brief Hospital Course:  Hazelyn Wise a 55 y.o.femalewith a past medical history significant for Lupus, Sjogren's syndrome, GERD, diverticulosis, gastritis, HTN, hypothyroidism, migraines, depression and multiple abdominal surgeries (appendectomy, hysterectomy, inguinal hernia repair) who presented with 3 episodes of hematemesis most likely secondary to complications from GERD.  Issues for Follow Up:   #Hematemesis, resolved Patient presented with 3 episodes of hematemesis. Patient has a history of Schatzki ring and ulcerated and severe acute gastritis in the gastric antrum  and pylorus. Patient had upper endoscopy which showed bleeding erosive gastropathy, non obstructing schatzki ring and small hiatal hernia  --Continue Nexium 40 mg BID --Will start Sulcrafate 1 mg QID  # Lupus/Sjogren's, stable Patient is followed by Rheumatology --Resume plaquenil and Imuran while NPO   # GERD, chronic Patient with a history of Schatzki's ring which has required dilatation in the past. No evidence of dysphagia at this time upper endoscopy. --Resume Nexium 40 mg BID  #Pulmonary Fibrosis, chronic Patient recently seen by Dr.Marshall her pulmonologist at [facility] (02/2016) and had PFTs and exercise oxygen titration done. Assessment for supplemental oxygen was done and patient did not require any, with some dyspnea noted during exercise but no desaturation. --Continue Albuterol 2 puff q6 prn  # Hypothyroidism, stable Most recent TSH >1.08. Well controlled on current regimen --Resume synthroid   # HTN, chronic  -- Resume HCTZ 25mg  daily after GI consult  # Depression, stable Continue home regimen when taking PO -- Resume Zoloft 50mg  qd.   # Migraine Headaches, chronic, stable -- Resume Imitrex 100 mg q2prn.   Significant Procedures: Upper endoscopy  Significant Labs and Imaging:   Recent Labs Lab 07/31/16 1942 08/01/16 0112 08/01/16 0441 08/02/16 0440  WBC 8.5 7.4 6.3  --   HGB 15.0 14.0 13.4 13.9  HCT 43.0 40.2 39.5 41.5  PLT 334 326 278  --     Recent Labs Lab 07/31/16 1942 08/01/16 0441 08/02/16 0440  NA 139 139 141  K 3.1* 2.9* 3.4*  CL 102 101 109  CO2 25 28 22   GLUCOSE 108* 113* 94  BUN 14 14 6   CREATININE 0.82 0.79 0.65  CALCIUM 9.8 9.1 8.9  MG  --  1.9  --     Results/Tests Pending at Time of Discharge: None  Discharge Medications:  Medication List    TAKE these medications   albuterol 108 (90 Base) MCG/ACT inhaler Commonly known as:  PROVENTIL HFA;VENTOLIN HFA Inhale 2 puffs into the lungs every 6 (six) hours as needed  for wheezing or shortness of breath.   azaTHIOprine 50 MG tablet Commonly known as:  IMURAN Take 100 mg by mouth 2 (two) times daily.   azelastine 0.1 % nasal spray Commonly known as:  ASTELIN Place 2 sprays into both nostrils 2 (two) times daily. Use in each nostril as directed   b complex vitamins capsule Take 1 capsule by mouth daily.   BODY/HAIR/SKIN/NAILS Caps Take 1 capsule by mouth daily.   chlorpheniramine-HYDROcodone 10-8 MG/5ML Suer Commonly known as:  TUSSIONEX Take 5 mLs by mouth every 12 (twelve) hours as needed for cough.   esomeprazole 40 MG capsule Commonly known as:  NEXIUM Take 1 capsule (40 mg total) by mouth 2 (two) times daily before a meal.   fexofenadine 180 MG tablet Commonly known as:  ALLEGRA Take 180 mg by mouth daily.   hydrochlorothiazide 25 MG tablet Commonly known as:  HYDRODIURIL Take 1 tablet (25 mg total) by mouth daily.   hydroxychloroquine 200 MG tablet Commonly known as:  PLAQUENIL Take 200 mg by mouth 2 (two) times daily.   levothyroxine 112 MCG tablet Commonly known as:  SYNTHROID, LEVOTHROID Take 1 tablet (112 mcg total) by mouth daily before breakfast.   promethazine 12.5 MG tablet Commonly known as:  PHENERGAN Take 1 tablet (12.5 mg total) by mouth every 8 (eight) hours as needed for nausea or vomiting.   sertraline 100 MG tablet Commonly known as:  ZOLOFT Take one-half tablet by  mouth daily   sucralfate 1 g tablet Commonly known as:  CARAFATE Take 1 tablet (1 g total) by mouth 4 (four) times daily -  with meals and at bedtime.   SUMAtriptan 100 MG tablet Commonly known as:  IMITREX Take 1 tablet (100 mg total) by mouth every 2 (two) hours as needed for migraine.   Vitamin D (Cholecalciferol) 400 units Tabs Take 400 Units by mouth daily.   zolpidem 5 MG tablet Commonly known as:  AMBIEN Take 1 tablet (5 mg total) by mouth at bedtime as needed for sleep.       Discharge Instructions: Please refer to Patient  Instructions section of EMR for full details.  Patient was counseled important signs and symptoms that should prompt return to medical care, changes in medications, dietary instructions, activity restrictions, and follow up appointments.   Follow-Up Appointments:   Marjie Skiff, MD 08/03/2016, 12:38 PM PGY-1, [place]

## 2016-08-03 NOTE — Progress Notes (Signed)
Nutrition Follow-up  DOCUMENTATION CODES:   Obesity unspecified  INTERVENTION:  Pt discharged home.   Recommended nutritional supplementation at home to aid in caloric and protein needs especially if po intake is poor.   NUTRITION DIAGNOSIS:   Inadequate oral intake related to nausea, vomiting as evidenced by per patient/family report; improved  GOAL:   Patient will meet greater than or equal to 90% of their needs; progressed  MONITOR:   Diet advancement, Labs, Weight trends, Skin, I & O's  REASON FOR ASSESSMENT:   Malnutrition Screening Tool    ASSESSMENT:   54 y.o. female with a past medical history significant for Lupus, Sjogren's syndrome, GERD, diverticulosis, gastritis, HTN, hypothyroidism, migraines, depression and multiple abdominal surgeries (appendectomy, hysterectomy, inguinal hernia repair) who presented with 3 episodes of hematemesis most likely secondary to complications from GERD.  Meal completion 15%. Pt reports experiencing early satiety, thus consuming small frequent meals. Pt discharged home. Pt educated on continuation of small frequent meals at home and the importance of adequate calorie and protein intake. Recommended nutritional supplementation at home as po intake has been poor. Pt expressed understanding.   Diet Order:  DIET SOFT Room service appropriate? Yes; Fluid consistency: Thin  Skin:  Reviewed, no issues  Last BM:  10/10  Height:   Ht Readings from Last 1 Encounters:  08/02/16 5\' 6"  (1.676 m)    Weight:   Wt Readings from Last 1 Encounters:  08/02/16 190 lb 14.7 oz (86.6 kg)    Ideal Body Weight:  59 kg  BMI:  Body mass index is 30.81 kg/m.  Estimated Nutritional Needs:   Kcal:  1800-2000  Protein:  85-95 grams  Fluid:  1.8-2 L/day  EDUCATION NEEDS:   No education needs identified at this time  Corrin Parker, MS, RD, LDN Pager # [phone] After hours/ weekend pager # [phone]

## 2016-08-06 NOTE — Telephone Encounter (Signed)
Pt called and would like to speak to the doctor about her stomach. She is having bowel movements that hurt and have blood in the, She said they are very loud. Please call to discuss. jw

## 2016-08-07 ENCOUNTER — Telehealth: Payer: Self-pay | Admitting: Family Medicine

## 2016-08-07 ENCOUNTER — Inpatient Hospital Stay (HOSPITAL_COMMUNITY)
Admission: RE | Admit: 2016-08-07 | Discharge: 2016-08-07 | Disposition: A | Discharge status: Home / Self Care | Payer: 59 | Source: Ambulatory Visit

## 2016-08-07 ENCOUNTER — Encounter: Payer: Self-pay | Admitting: Family Medicine

## 2016-08-07 ENCOUNTER — Encounter: Payer: Self-pay | Admitting: *Deleted

## 2016-08-07 DIAGNOSIS — J8489 Other specified interstitial pulmonary diseases: Secondary | ICD-10-CM

## 2016-08-07 DIAGNOSIS — G43811 Other migraine, intractable, with status migrainosus: Secondary | ICD-10-CM

## 2016-08-07 DIAGNOSIS — M358 Other specified systemic involvement of connective tissue: Principal | ICD-10-CM

## 2016-08-07 DIAGNOSIS — M3213 Lung involvement in systemic lupus erythematosus: Secondary | ICD-10-CM

## 2016-08-07 MED ORDER — PROMETHAZINE HCL 12.5 MG PO TABS: 12.5000 mg | ORAL_TABLET | Freq: Three times a day (TID) | ORAL | 0 refills | Status: DC | PRN

## 2016-08-07 NOTE — Telephone Encounter (Signed)
Message sent to patient via Mychart informed that we cannot forward copy of CT to dentist as there is no signed release in her chart. A copy of CT was left upfront for patient.

## 2016-08-07 NOTE — Assessment & Plan Note (Signed)
Completed FMLA paperwork and faxed to employer.

## 2016-08-07 NOTE — Telephone Encounter (Signed)
Spoke to patient via telephone. Received Mychart message stating that she was having some bleeding. Recently discharge for Hematemesis. 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 Still having nausea/decreased appetite. Recommended that she make an appointment with her GI physician Dr. Hilarie Fredrickson ASAP. If unable to get in in the next few days and symptoms worsening she is to make appointment at [facility]. Will send in prescription for Phengergan to help with nausea. Has not been able to take SLE meds for the past two weeks (due to nausea/emesis). Told patient to follow up with [name] (Rheumatology). Patient requests referral to [facility] Rheumatologist as [name] is not in [facility] and does not have access to her records. Will place referral.   Completed FMLA paperwork and faxed to [place].

## 2016-08-08 ENCOUNTER — Emergency Department (HOSPITAL_COMMUNITY)
Admission: EM | Admit: 2016-08-08 | Discharge: 2016-08-09 | Disposition: A | Discharge status: Home / Self Care | Payer: 59 | Attending: Emergency Medicine | Admitting: Emergency Medicine

## 2016-08-08 ENCOUNTER — Emergency Department (HOSPITAL_COMMUNITY): Payer: 59

## 2016-08-08 ENCOUNTER — Encounter (HOSPITAL_COMMUNITY): Payer: Self-pay

## 2016-08-08 DIAGNOSIS — I10 Essential (primary) hypertension: Secondary | ICD-10-CM | POA: Insufficient documentation

## 2016-08-08 DIAGNOSIS — Z79899 Other long term (current) drug therapy: Secondary | ICD-10-CM | POA: Diagnosis not present

## 2016-08-08 DIAGNOSIS — R109 Unspecified abdominal pain: Secondary | ICD-10-CM | POA: Diagnosis present

## 2016-08-08 DIAGNOSIS — E039 Hypothyroidism, unspecified: Secondary | ICD-10-CM | POA: Diagnosis not present

## 2016-08-08 DIAGNOSIS — K529 Noninfective gastroenteritis and colitis, unspecified: Secondary | ICD-10-CM | POA: Insufficient documentation

## 2016-08-08 LAB — COMPREHENSIVE METABOLIC PANEL
ALT: 19 U/L (ref 14–54)
AST: 25 U/L (ref 15–41)
Albumin: 4.2 g/dL (ref 3.5–5.0)
Alkaline Phosphatase: 51 U/L (ref 38–126)
Anion gap: 10 (ref 5–15)
BUN: 7 mg/dL (ref 6–20)
CO2: 22 mmol/L (ref 22–32)
Calcium: 9.3 mg/dL (ref 8.9–10.3)
Chloride: 108 mmol/L (ref 101–111)
Creatinine, Ser: 0.78 mg/dL (ref 0.44–1.00)
GFR calc Af Amer: >60 mL/min (ref >60)
GFR calc non Af Amer: >60 mL/min (ref >60)
Glucose, Bld: 108 mg/dL — ABNORMAL HIGH (ref 65–99)
Potassium: 3.3 mmol/L — ABNORMAL LOW (ref 3.5–5.1)
Sodium: 140 mmol/L (ref 135–145)
Total Bilirubin: 0.8 mg/dL (ref 0.3–1.2)
Total Protein: 7.1 g/dL (ref 6.5–8.1)

## 2016-08-08 LAB — CBC
HCT: 41.8 % (ref 36.0–46.0)
Hemoglobin: 14.5 g/dL (ref 12.0–15.0)
MCH: 30 pg (ref 26.0–34.0)
MCHC: 34.7 g/dL (ref 30.0–36.0)
MCV: 86.4 fL (ref 78.0–100.0)
Platelets: 301 10*3/uL (ref 150–400)
RBC: 4.84 MIL/uL (ref 3.87–5.11)
RDW: 13.8 % (ref 11.5–15.5)
WBC: 6.4 10*3/uL (ref 4.0–10.5)

## 2016-08-08 LAB — LIPASE, BLOOD: LIPASE: 32 U/L (ref 11–51)

## 2016-08-08 LAB — POC OCCULT BLOOD, ED: Fecal Occult Bld: NEGATIVE

## 2016-08-08 MED ORDER — MORPHINE SULFATE (PF) 4 MG/ML IV SOLN: 4.0000 mg | Freq: Once | INTRAVENOUS | Status: DC

## 2016-08-08 MED ORDER — SODIUM CHLORIDE 0.9 % IV BOLUS (SEPSIS): 1000.0000 mL | Freq: Once | INTRAVENOUS | Status: DC

## 2016-08-08 MED ORDER — ONDANSETRON 4 MG PO TBDP: 4.0000 mg | ORAL_TABLET | Freq: Once | ORAL | Status: DC | PRN

## 2016-08-08 MED ORDER — BARIUM SULFATE 2.1 % PO SUSP
ORAL | Status: AC
  Filled 2016-08-08: qty 2

## 2016-08-08 MED ORDER — ONDANSETRON HCL 4 MG/2ML IJ SOLN
4.0000 mg | Freq: Once | INTRAMUSCULAR | Status: AC
  Administered 2016-08-08: 4 mg via INTRAVENOUS
  Filled 2016-08-08: qty 2

## 2016-08-08 MED ORDER — SODIUM CHLORIDE 0.9 % IV BOLUS (SEPSIS)
1000.0000 mL | Freq: Once | INTRAVENOUS | Status: AC
  Administered 2016-08-08: 1000 mL via INTRAVENOUS

## 2016-08-08 MED ORDER — GI COCKTAIL ~~LOC~~
30.0000 mL | Freq: Once | ORAL | Status: AC
  Administered 2016-08-08: 30 mL via ORAL
  Filled 2016-08-08: qty 30

## 2016-08-08 MED ORDER — ONDANSETRON 4 MG PO TBDP
ORAL_TABLET | ORAL | Status: AC
  Administered 2016-08-08: 4 mg
  Filled 2016-08-08: qty 1

## 2016-08-08 MED ORDER — METRONIDAZOLE 500 MG PO TABS
500.0000 mg | ORAL_TABLET | Freq: Once | ORAL | Status: AC
  Administered 2016-08-09: 500 mg via ORAL
  Filled 2016-08-08: qty 1

## 2016-08-08 MED ORDER — ONDANSETRON HCL 4 MG/2ML IJ SOLN: 4.0000 mg | Freq: Once | INTRAMUSCULAR | Status: DC

## 2016-08-08 MED ORDER — CIPROFLOXACIN HCL 500 MG PO TABS
500.0000 mg | ORAL_TABLET | Freq: Once | ORAL | Status: AC
  Administered 2016-08-09: 500 mg via ORAL
  Filled 2016-08-08: qty 1

## 2016-08-08 NOTE — ED Provider Notes (Signed)
[facility] DEPT Provider Note   CSN: [id number] Arrival date & time: 08/08/16  1337     History   Chief Complaint Chief Complaint  Patient presents with  . Abdominal Pain  . Rectal Bleeding    HPI Gina Wise is a 55 y.o. female.  HPI Pt states last week she was vomiting blood.  SHe was seen in the ED.  She followed up with GI and had an endoscopy and they noticed erosions.  She was released on Friday.  Since being released on Monday she started having blood in her stool.  When she tries to eat it feels like her symptoms have gotten worse.  She spoke to her primary doctor.  They suggested waiting a little more.  Yesterday she felt fine but today she has not felt well.  She has been afraid to eat anything.  She vomited twice today.  She could not keep down her antinausea medications. Past Medical History:  Diagnosis Date  . Allergy   . Anemia   . Chronic bronchitis ([place])    "get it q yr; in the spring time" (02/17/2014)  . Depression    "have taken zoloft since 1996" (02/17/2014)  . Diverticulosis   . Gastritis   . GERD (gastroesophageal reflux disease)   . Grover's disease   . High cholesterol   . Hypertension   . Hypothyroid   . Migraines    "maybe a couple/yr" (02/17/2014)  . Multiple environmental allergies   . Overweight(278.02)   . Peri-menopause   . Schatzki's ring   . Sjogren's disease ([place])   . Systemic lupus [facility])     Patient Active Problem List   Diagnosis Date Noted  . Acute gastric erosion   . Hematemesis with nausea 08/01/2016  . Unsteadiness on feet 07/13/2016  . Allergic rhinitis 07/13/2016  . Pain in the chest 05/23/2016  . Sinusitis 05/03/2016  . Lentigo 02/17/2016  . Skin lesion 02/17/2016  . Lupus (systemic lupus erythematosus) ([place]) 02/17/2016  . Upper airway cough syndrome 12/16/2015  . Epistaxis 12/01/2015  . Interstitial lung disease due to connective tissue disease ([place]) 11/07/2015  . Frontal sinus pain 11/01/2015  . Hair loss  08/19/2015  . CAP (community acquired pneumonia) 08/03/2015  . Shortness of breath   . Lupus   . Sjogren's disease ([place])   . Essential hypertension   . Other specified hypothyroidism   . Lung disease with systemic lupus erythematosus ([place]) 07/14/2015  . Rash and nonspecific skin eruption 05/20/2015  . Bruising 05/10/2015  . Pain in joint, multiple sites 05/10/2015  . Shingles 02/16/2015  . 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 Allergic conjunctivitis 12/13/2014  . Throat clearing 08/10/2014  . Hyperlipidemia 08/10/2014  . Medication side effect 03/30/2014  . Gastroparesis 03/11/2014  . Schatzki's ring 03/09/2014  . Unspecified constipation 02/23/2014  . GERD (gastroesophageal reflux disease) 02/23/2014  . Goiter 07/03/2013  . Cervical lymphadenopathy 06/15/2013  . Depression with anxiety 06/15/2013  . Cough 01/02/2013  . Headache(784.0) 10/24/2012  . Iron deficiency 03/13/2012  . Fatigue 03/12/2012  . Dysfunctional uterine bleeding 01/02/2012  . Insomnia 10/03/2011  . Dyslipidemia 09/03/2011  . Neck pain, chronic 09/03/2011  . Overweight 01/03/2011  . 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 Migraine 10/04/2009  . Hypothyroidism 09/09/2009  . Essential hypertension, benign 09/09/2009    Past Surgical History:  Procedure Laterality Date  . APPENDECTOMY  1987  . ESOPHAGOGASTRODUODENOSCOPY N/A 02/28/2014   Procedure: ESOPHAGOGASTRODUODENOSCOPY (EGD);  Surgeon: Jerene Bears, MD;  Location: [facility] ENDOSCOPY;  Service: Endoscopy;  Laterality: N/A;  . ESOPHAGOGASTRODUODENOSCOPY N/A 08/02/2016   Procedure: ESOPHAGOGASTRODUODENOSCOPY (EGD);  Surgeon: Gatha Mayer, MD;  Location: [facility] ENDOSCOPY;  Service: Endoscopy;  Laterality: N/A;  . EXPLORATORY LAPAROTOMY  1987  . FOOT SURGERY Right ~1980   "don't know what they did; had to take something out"  . INGUINAL HERNIA REPAIR Right ~ 2003  . SHOULDER OPEN ROTATOR CUFF REPAIR Left 1995  . SHOULDER SURGERY Left 1997    "cartilage"  . TONSILLECTOMY AND ADENOIDECTOMY  1976  . TUBAL LIGATION  1998   laparoscopic  . VAGINAL HYSTERECTOMY  04/08/2012   menorrhagia    OB History    Gravida Para Term Preterm AB Living   2 2 2     2    SAB TAB Ectopic Multiple Live Births                   Home Medications    Prior to Admission medications   Medication Sig Start Date End Date Taking? Authorizing Provider  albuterol (PROVENTIL HFA;VENTOLIN HFA) 108 (90 Base) MCG/ACT inhaler Inhale 2 puffs into the lungs every 6 (six) hours as needed for wheezing or shortness of breath. 05/23/16  Yes Elberta Leatherwood, MD  azaTHIOprine (IMURAN) 50 MG tablet Take 100 mg by mouth 2 (two) times daily.  01/26/16  Yes Historical Provider, MD  azelastine (ASTELIN) 0.1 % nasal spray Place 2 sprays into both nostrils 2 (two) times daily. Use in each nostril as directed 07/13/16  Yes [name], MD  b complex vitamins capsule Take 1 capsule by mouth daily.   Yes Historical Provider, MD  diphenhydrAMINE (BENADRYL) 25 MG tablet Take 25 mg by mouth every 6 (six) hours as needed for itching or allergies.   Yes Historical Provider, MD  esomeprazole (NEXIUM) 40 MG capsule Take 1 capsule (40 mg total) by mouth 2 (two) times daily before a meal. 12/16/15  Yes Tanda Rockers, MD  fexofenadine (ALLEGRA) 180 MG tablet Take 180 mg by mouth daily.   Yes Historical Provider, MD  hydrochlorothiazide (HYDRODIURIL) 25 MG tablet Take 1 tablet (25 mg total) by mouth daily. 03/13/16  Yes [name], MD  hydroxychloroquine (PLAQUENIL) 200 MG tablet Take 200 mg by mouth 2 (two) times daily.    Yes Historical Provider, MD  levothyroxine (SYNTHROID, LEVOTHROID) 112 MCG tablet Take 1 tablet (112 mcg total) by mouth daily before breakfast. 04/19/16  Yes [name], MD  Multiple Vitamins-Minerals (BODY/HAIR/SKIN/NAILS) CAPS Take 1 capsule by mouth daily.   Yes Historical Provider, MD  promethazine (PHENERGAN) 12.5 MG tablet Take 1 tablet (12.5 mg total) by mouth every  8 (eight) hours as needed for nausea or vomiting. 08/07/16  Yes [name], MD  sertraline (ZOLOFT) 100 MG tablet Take one-half tablet by  mouth daily Patient taking differently: Takes 50mg  once in the morning 06/29/16  Yes [name], MD  sucralfate (CARAFATE) 1 g tablet Take 1 tablet (1 g total) by mouth 4 (four) times daily -  with meals and at bedtime. 08/03/16  Yes Abdoulaye Diallo, MD  SUMAtriptan (IMITREX) 100 MG tablet Take 1 tablet (100 mg total) by mouth every 2 (two) hours as needed for migraine. 02/01/16  Yes [name], MD  Vitamin D, Cholecalciferol, 400 UNITS TABS Take 400 Units by mouth daily.   Yes Historical Provider, MD  zolpidem (AMBIEN) 5 MG tablet Take 1 tablet (5 mg total) by mouth at bedtime  as needed for sleep. 03/13/16  Yes [name], MD  chlorpheniramine-HYDROcodone (TUSSIONEX) 10-8 MG/5ML SUER Take 5 mLs by mouth every 12 (twelve) hours as needed for cough. Patient not taking: Reported on 08/08/2016 12/16/15   Tanda Rockers, MD  ciprofloxacin (CIPRO) 500 MG tablet Take 1 tablet (500 mg total) by mouth 2 (two) times daily. 08/09/16   Dorie Rank, MD  HYDROcodone-acetaminophen (NORCO/VICODIN) 5-325 MG tablet Take 1 tablet by mouth every 4 (four) hours as needed. 08/09/16   Dorie Rank, MD  metroNIDAZOLE (FLAGYL) 500 MG tablet Take 1 tablet (500 mg total) by mouth 3 (three) times daily. 08/09/16   Dorie Rank, MD    Family History Family History  Problem Relation Age of Onset  . Hypertension Father   . Emphysema Father   . Vision loss Father   . Alcohol abuse Father   . Hypertension Mother   . Heart disease Mother   . Hepatitis Mother     Hep C  . Endometriosis Mother   . Lupus Sister   . Endometriosis Sister   . Breast cancer Maternal Aunt     30's  . Breast cancer Paternal Aunt     3's  . Lung cancer Paternal Uncle     smoker  . Colon cancer Maternal Grandfather   . Uterine cancer Maternal Aunt     50's  . Cervical cancer Paternal Aunt     40's  .  Lung cancer Paternal Uncle   . Stomach cancer Neg Hx     Social History Social History  Substance Use Topics  . Smoking status: Never Smoker  . Smokeless tobacco: Never Used  . Alcohol use 0.0 oz/week     Comment: 02/17/2014 "might have a few drinks a couple times/yr"     Allergies   Contrast media [iodinated diagnostic agents]; Other; Codeine; Erythromycin; and Iohexol   Review of Systems Review of Systems   Physical Exam Updated Vital Signs BP 141/72   Pulse 81   Temp 98.6 F (37 C) (Oral)   Resp 16   LMP 04/03/2012   SpO2 100%   Physical Exam  Constitutional: She appears well-developed and well-nourished. No distress.  HENT:  Head: Normocephalic and atraumatic.  Right Ear: External ear normal.  Left Ear: External ear normal.  Eyes: Conjunctivae are normal. Right eye exhibits no discharge. Left eye exhibits no discharge. No scleral icterus.  Neck: Neck supple. No tracheal deviation present.  Cardiovascular: Normal rate, regular rhythm and intact distal pulses.   Pulmonary/Chest: Effort normal and breath sounds normal. No stridor. No respiratory distress. She has no wheezes. She has no rales.  Abdominal: Soft. Bowel sounds are normal. She exhibits no distension. There is tenderness in the epigastric area. There is no rigidity, no rebound and no guarding. No hernia.  Musculoskeletal: She exhibits no edema or tenderness.  Neurological: She is alert. She has normal strength. No cranial nerve deficit (no facial droop, extraocular movements intact, no slurred speech) or sensory deficit. She exhibits normal muscle tone. She displays no seizure activity. Coordination normal.  Skin: Skin is warm and dry. No rash noted.  Psychiatric: She has a normal mood and affect.  Nursing note and vitals reviewed.    ED Treatments / Results  Labs (all labs ordered are listed, but only abnormal results are displayed) Labs Reviewed  COMPREHENSIVE METABOLIC PANEL - Abnormal; Notable for  the following:       Result Value   Potassium 3.3 (*)  Glucose, Bld 108 (*)    All other components within normal limits  LIPASE, BLOOD  CBC  POC OCCULT BLOOD, ED    Radiology Ct Abdomen Pelvis Wo Contrast  Result Date: 08/08/2016 CLINICAL DATA:  55 year old female with abdominal pain and rectal bleeding. EXAM: CT ABDOMEN AND PELVIS WITHOUT CONTRAST TECHNIQUE: Multidetector CT imaging of the abdomen and pelvis was performed following the standard protocol without IV contrast. COMPARISON:  Abdominal CT dated 02/12/2014. FINDINGS: Evaluation of this exam is limited in the absence of intravenous contrast. Lower chest: The visualized lung bases are clear. No intra-abdominal free air or free fluid. Hepatobiliary: Diffuse fatty infiltration of the liver. No intrahepatic biliary ductal dilatation. The gallbladder appears unremarkable. Pancreas: Partial fatty infiltration of the pancreas. No pancreatic duct dilatation or peripancreatic inflammation. Spleen: Normal in size without focal abnormality. Adrenals/Urinary Tract: Adrenal glands are unremarkable. Kidneys are normal, without renal calculi, focal lesion, or hydronephrosis. Bladder is unremarkable. Stomach/Bowel: Small hiatal hernia. There is segmental thickening of the sigmoid colon concerning for mild colitis. Clinical correlation is recommended. There is no evidence of bowel obstruction. Appendectomy. Vascular/Lymphatic: The abdominal aorta and IVC are grossly unremarkable on this noncontrast study. No portal venous gas identified. There is no adenopathy. Reproductive: Hysterectomy. Other:  None. Musculoskeletal: None the osseous structures are intact. Small bone island noted in the right femoral head IMPRESSION: Mild segmental colitis of the sigmoid colon. No evidence of perforation or abscess. No bowel obstruction. Electronically Signed   By: Anner Crete M.D.   On: 08/08/2016 23:16    Procedures Procedures (including critical care  time)  Medications Ordered in ED Medications  ondansetron (ZOFRAN-ODT) disintegrating tablet 4 mg (not administered)  Barium Sulfate 2.1 % SUSP (not administered)  ciprofloxacin (CIPRO) tablet 500 mg (not administered)  metroNIDAZOLE (FLAGYL) tablet 500 mg (not administered)  ondansetron (ZOFRAN-ODT) 4 MG disintegrating tablet (4 mg  Given 08/08/16 1557)  sodium chloride 0.9 % bolus 1,000 mL (0 mLs Intravenous Stopped 08/08/16 2016)  ondansetron (ZOFRAN) injection 4 mg (4 mg Intravenous Given 08/08/16 1828)  gi cocktail (Maalox,Lidocaine,Donnatal) (30 mLs Oral Given 08/08/16 1946)     Initial Impression / Assessment and Plan / ED Course  I have reviewed the triage vital signs and the nursing notes.  Pertinent labs & imaging results that were available during my care of the patient were reviewed by me and considered in my medical decision making (see chart for details).  Clinical Course  Comment By Time  Complaining of more pain.  REctal exam no gross blood.  Will ct to evaluate further Dorie Rank, MD 10/18 1923   Ct demonstrates colitis.  Suspect infectious etiology.  Doubt ischemic.  Will dc home on cipro and flagyl.  Follow up with GI  Final Clinical Impressions(s) / ED Diagnoses   Final diagnoses:  Colitis    New Prescriptions New Prescriptions   CIPROFLOXACIN (CIPRO) 500 MG TABLET    Take 1 tablet (500 mg total) by mouth 2 (two) times daily.   HYDROCODONE-ACETAMINOPHEN (NORCO/VICODIN) 5-325 MG TABLET    Take 1 tablet by mouth every 4 (four) hours as needed.   METRONIDAZOLE (FLAGYL) 500 MG TABLET    Take 1 tablet (500 mg total) by mouth 3 (three) times daily.     Dorie Rank, MD 08/09/16 [phone]

## 2016-08-08 NOTE — ED Triage Notes (Signed)
Patient just recently released from the hospital for GI pain and vomiting blood, had endo for same. Developed rectal bleeding on Sunday and continuing to have lower abdominal cramping with same. Describes the bleeding as bright red, alert and oriented

## 2016-08-08 NOTE — ED Notes (Signed)
Pt is stating that she is nauseous and is requesting something for it. Pt states her pain is 6/10.

## 2016-08-09 ENCOUNTER — Telehealth: Payer: Self-pay | Admitting: Family Medicine

## 2016-08-09 ENCOUNTER — Ambulatory Visit (HOSPITAL_COMMUNITY): Payer: 59

## 2016-08-09 MED ORDER — METRONIDAZOLE 500 MG PO TABS: 500.0000 mg | ORAL_TABLET | Freq: Three times a day (TID) | ORAL | 0 refills | Status: DC

## 2016-08-09 MED ORDER — CIPROFLOXACIN HCL 500 MG PO TABS
500.0000 mg | ORAL_TABLET | Freq: Two times a day (BID) | ORAL | 0 refills | Status: DC
Start: 2016-08-09 — End: 2016-10-24

## 2016-08-09 MED ORDER — HYDROCODONE-ACETAMINOPHEN 5-325 MG PO TABS: 1.0000 | ORAL_TABLET | ORAL | 0 refills | Status: DC | PRN

## 2016-08-09 NOTE — Telephone Encounter (Signed)
I reviewed the recent ED note. Patient started on Cipro and Flagyl for suspected infectious colitis. She was recently admitted for upper GI bleed. If patient comes to office please inform her that her symptoms will likely improve with treatment. If she appears ill I would have a provider evaluate her. My need hemoglobin level checked as well.

## 2016-08-09 NOTE — Discharge Instructions (Signed)
Take the medications as prescribed, follow up with your primary care and gi doctor, return as needed for fever, worsening symptoms

## 2016-08-09 NOTE — Telephone Encounter (Signed)
Will forward to PCP.  Michaila Kenney L, RN  

## 2016-08-09 NOTE — Telephone Encounter (Signed)
Pt called crying, pt is not feeling well. Pt was diagnosed with colitis in the ER. Pt was passing blood. Pt is unable to keep anything down, can't eat, drink or even keep medicine down. Pt has a headache. I spoke with Tamika, she recommended coming in tomorrow for triage around 9:30-10:00 since we do not have any am appointments. Pt did not say she would come or not. Pt stated she just wanted to leave a message for Dr. Ree Kida to see what he had to say. Please advise. Thanks! ep

## 2016-08-10 MED ORDER — ONDANSETRON 4 MG PO TBDP: 4.0000 mg | ORAL_TABLET | Freq: Three times a day (TID) | ORAL | 0 refills | Status: DC | PRN

## 2016-08-10 NOTE — Telephone Encounter (Signed)
Covering for Dr. Ree Kida, who is out of town today. Chart reviewed. I agree that patient should be seen. I can send in some zofran for her. If this does not help and she is not able to keep at least liquids down, she will absolutely have to go to the ER this weekend to be evaluated. Please inform patient.  Leeanne [place], MD

## 2016-08-10 NOTE — Telephone Encounter (Signed)
Return call to patient regarding nausea and vomiting.  Informed patient of note from Dr. Ree Kida.  Advised patient to come into clinic to be seen.  Patient reported able to keep a little bit of broth down long enough to take her medication. Patient stated that she has a prescription for phenergan is not working.  She is unable to keep it down.  Patient requested if provider could send in Zofran the dissolvable tablet.  She was given that in the hospital and it worked. Appointment was offered but patient only want to see Dr. Ree Kida.  Advised patient that Dr. Ree Kida is out of office until Monday.  He does not have a schedule until next Thursday and Friday, both are booked.  Advised patient she could call after hours line if needed over the weekend, if she did not want to go back to ED.  Patient stated understanding.  Will forward to Dr. Tawni Carnes and Dr. Ardelia Mems.  Derl Barrow, RN

## 2016-08-10 NOTE — Telephone Encounter (Signed)
Patient advised that the Zofran was sent in to her pharmacy.  If she is not able to keep liquids down to go to ED over the weekend.  Patient stated understanding.  Derl Barrow, RN

## 2016-08-13 ENCOUNTER — Telehealth: Payer: Self-pay | Admitting: Family Medicine

## 2016-08-13 ENCOUNTER — Encounter: Payer: Self-pay | Admitting: Family Medicine

## 2016-08-13 NOTE — Telephone Encounter (Signed)
Work note written and placed at front desk for pickup.

## 2016-08-13 NOTE — Telephone Encounter (Signed)
Pt needs a return to work note. She went to work today but was told to leave until she had a return to work note from her dr. She is hoping to get the note today by 3:30.Please call her when it is ready

## 2016-08-13 NOTE — Telephone Encounter (Signed)
Called patient to follow up on illness. Now able to tolerate medications and a small amount of food. Less nauseated. Last BM was 3 days. No further bleeding. Patient has Miralax at home would like to attempt trial.

## 2016-08-14 ENCOUNTER — Ambulatory Visit (HOSPITAL_COMMUNITY): Payer: 59

## 2016-08-14 ENCOUNTER — Encounter: Payer: Self-pay | Admitting: Family Medicine

## 2016-08-15 ENCOUNTER — Encounter: Payer: Self-pay | Admitting: Family Medicine

## 2016-08-16 ENCOUNTER — Ambulatory Visit (HOSPITAL_COMMUNITY): Payer: 59

## 2016-08-21 ENCOUNTER — Ambulatory Visit (HOSPITAL_COMMUNITY): Payer: 59

## 2016-08-23 ENCOUNTER — Ambulatory Visit (HOSPITAL_COMMUNITY): Payer: 59

## 2016-08-28 ENCOUNTER — Ambulatory Visit (HOSPITAL_COMMUNITY): Payer: 59

## 2016-08-30 ENCOUNTER — Ambulatory Visit (HOSPITAL_COMMUNITY): Payer: 59

## 2016-09-04 ENCOUNTER — Ambulatory Visit (HOSPITAL_COMMUNITY): Payer: 59

## 2016-09-06 ENCOUNTER — Ambulatory Visit (HOSPITAL_COMMUNITY): Payer: 59

## 2016-09-11 ENCOUNTER — Ambulatory Visit (HOSPITAL_COMMUNITY): Payer: 59

## 2016-09-13 ENCOUNTER — Ambulatory Visit (HOSPITAL_COMMUNITY): Payer: 59

## 2016-09-18 ENCOUNTER — Ambulatory Visit (HOSPITAL_COMMUNITY): Payer: 59

## 2016-09-18 ENCOUNTER — Telehealth: Payer: Self-pay | Admitting: Family Medicine

## 2016-09-18 NOTE — Telephone Encounter (Signed)
Spoke with [employer] from [employer] ([place]). The patient is covered for FMLA for the next 6 months.

## 2016-09-20 ENCOUNTER — Ambulatory Visit (HOSPITAL_COMMUNITY): Payer: 59

## 2016-09-25 ENCOUNTER — Ambulatory Visit (HOSPITAL_COMMUNITY): Payer: 59

## 2016-09-27 ENCOUNTER — Ambulatory Visit (HOSPITAL_COMMUNITY): Payer: 59

## 2016-10-01 ENCOUNTER — Other Ambulatory Visit: Payer: Self-pay | Admitting: Family Medicine

## 2016-10-02 ENCOUNTER — Ambulatory Visit (HOSPITAL_COMMUNITY): Payer: 59

## 2016-10-04 ENCOUNTER — Ambulatory Visit (HOSPITAL_COMMUNITY): Payer: 59

## 2016-10-09 ENCOUNTER — Ambulatory Visit (HOSPITAL_COMMUNITY): Payer: 59

## 2016-10-11 ENCOUNTER — Ambulatory Visit (HOSPITAL_COMMUNITY): Payer: 59

## 2016-10-16 ENCOUNTER — Ambulatory Visit (HOSPITAL_COMMUNITY): Payer: 59

## 2016-10-18 ENCOUNTER — Ambulatory Visit (HOSPITAL_COMMUNITY): Payer: 59

## 2016-10-23 ENCOUNTER — Ambulatory Visit (HOSPITAL_COMMUNITY): Payer: 59

## 2016-10-23 NOTE — Progress Notes (Signed)
Office Visit Note  Patient: Gina Wise             Date of Birth: October 20, 1961           MRN: [id number]             PCP: [name], MD Referring: [name], MD Visit Date: 10/24/2016 Occupation: Health care billing specialist at [employer]:  Pain hands and feet.   History of Present Illness: [name] is a 56 y.o. female with history of possible autoimmune disease. According to patient her symptoms a started 7 years ago with hypothyroidism. She has had recurrent bronchitis over the last few years. 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 She did not have very good communication with the doctor and she switched to [name]. Who felt her symptoms were related to reflux. In the meantime she also started seeing a rheumatologist at [name] who did some lab work and felt she may have overlap of Sjogren's and lupus she was started on Plaquenil. She states the Plaquenil helped with arthralgias in the beginning and then quit working. She switched to [name] who increased her Plaquenil to twice a day which initially helped her but then quit working. She was started on CellCept but she had a reaction to the CellCept and was discontinued. She was started on Imuran about 6 months ago initially at 50 mg and was later increased to 200 mg per day per recommendations of her pulmonologist [name] at [facility]. She has been seeing [name] on regular basis her last appointment was asked months ago and neck appointment is in March 2018. She states her cough is not better she to still continues to have bad cough and has difficulty breathing. She also feels that she is having a lot of side effects from Imuran. She has taken prednisone several times over the past 18 months. According to patient it did not help her symptoms except for she gained 30 pounds of  weight.  She was hospitalized 2 months ago with stomach wireless. She reports she was vomiting blood and also having bleeding per rectum. An endoscopy showed gastritis she was also diagnosed with colitis but the colonoscopy was not done at this time. Her last colonoscopy was 4 years ago.   Activities of Daily Living:  Patient reports morning stiffness for 30 minutes.   Patient Denies nocturnal pain.  Difficulty dressing/grooming: Denies Difficulty climbing stairs: Denies Difficulty getting out of chair: Reports Difficulty using hands for taps, buttons, cutlery, and/or writing: Denies   Review of Systems  Constitutional: Positive for fatigue and weight gain. Negative for night sweats, weight loss and weakness.  HENT: Positive for mouth dryness. Negative for mouth sores, trouble swallowing, trouble swallowing and nose dryness.   Eyes: Positive for dryness. Negative for pain, redness and visual disturbance.  Respiratory: Positive for cough and shortness of breath. Negative for difficulty breathing.   Cardiovascular: Positive for hypertension. Negative for chest pain, palpitations, irregular heartbeat and swelling in legs/feet.  Gastrointestinal: Negative for blood in stool, constipation and diarrhea.  Endocrine: Negative for increased urination.  Genitourinary: Negative for vaginal dryness.  Musculoskeletal: Positive for arthralgias, joint pain and muscle weakness. Negative for joint swelling, myalgias, morning stiffness, muscle tenderness and myalgias.  Skin: Positive for hair loss. Negative for color change, rash, skin tightness, ulcers and sensitivity to sunlight.  Allergic/Immunologic: Negative for susceptible to infections.  Neurological: Positive for headaches. Negative for dizziness and night sweats.  Hematological: Negative for swollen glands.  Psychiatric/Behavioral: Positive for depressed mood. Negative for sleep disturbance. The patient is not nervous/anxious.     PMFS History:    Patient Active Problem List   Diagnosis Date Noted  . Acute gastric erosion   . Hematemesis with nausea 08/01/2016  . Unsteadiness on feet 07/13/2016  . Allergic rhinitis 07/13/2016  . Pain in the chest 05/23/2016  . Sinusitis 05/03/2016  . Lentigo 02/17/2016  . Skin lesion 02/17/2016  . Lupus (systemic lupus erythematosus) ([place]) 02/17/2016  . Upper airway cough syndrome 12/16/2015  . Epistaxis 12/01/2015  . Interstitial lung disease due to connective tissue disease ([place]) 11/07/2015  . Frontal sinus pain 11/01/2015  . Hair loss 08/19/2015  . CAP (community acquired pneumonia) 08/03/2015  . Shortness of breath   . Lupus   . Sjogren's disease ([place])   . Essential hypertension   . Other specified hypothyroidism   . Lung disease with systemic lupus erythematosus ([place]) 07/14/2015  . Rash and nonspecific skin eruption 05/20/2015  . Bruising 05/10/2015  . Pain in joint, multiple sites 05/10/2015  . Shingles 02/16/2015  . 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 Allergic conjunctivitis 12/13/2014  . Throat clearing 08/10/2014  . Hyperlipidemia 08/10/2014  . Medication side effect 03/30/2014  . Gastroparesis 03/11/2014  . Schatzki's ring 03/09/2014  . Unspecified constipation 02/23/2014  . GERD (gastroesophageal reflux disease) 02/23/2014  . Goiter 07/03/2013  . Cervical lymphadenopathy 06/15/2013  . Depression with anxiety 06/15/2013  . Cough 01/02/2013  . Headache(784.0) 10/24/2012  . Iron deficiency 03/13/2012  . Fatigue 03/12/2012  . Dysfunctional uterine bleeding 01/02/2012  . Insomnia 10/03/2011  . Dyslipidemia 09/03/2011  . Neck pain, chronic 09/03/2011  . Overweight 01/03/2011  . 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 Migraine 10/04/2009  . Hypothyroidism 09/09/2009  . Essential hypertension, benign 09/09/2009    Past Medical History:  Diagnosis Date  . Allergy   . Anemia   . Chronic bronchitis ([place])    "get it q yr; in the spring time"  (02/17/2014)  . Depression    "have taken zoloft since 1996" (02/17/2014)  . Diverticulosis   . Gastritis   . GERD (gastroesophageal reflux disease)   . Grover's disease   . High cholesterol   . Hypertension   . Hypothyroid   . Migraines    "maybe a couple/yr" (02/17/2014)  . Multiple environmental allergies   . Overweight(278.02)   . Peri-menopause   . Schatzki's ring   . Sjogren's disease ([place])   . Systemic lupus (HCC)     Family History  Problem Relation Age of Onset  . Hypertension Father   . Emphysema Father   . Vision loss Father   . Alcohol abuse Father   . Hypertension Mother   . Heart disease Mother   . Hepatitis Mother     Hep C  . Endometriosis Mother   . Lupus Sister   . Endometriosis Sister   . Breast cancer Maternal Aunt     30's  . Breast cancer Paternal Aunt     73's  . Lung cancer Paternal Uncle     smoker  . Colon cancer Maternal Grandfather   . Uterine cancer Maternal Aunt     50's  . Cervical cancer Paternal Aunt     40's  . Lung cancer Paternal Uncle   . Stomach cancer Neg Hx  Past Surgical History:  Procedure Laterality Date  . APPENDECTOMY  1987  . ESOPHAGOGASTRODUODENOSCOPY N/A 02/28/2014   Procedure: ESOPHAGOGASTRODUODENOSCOPY (EGD);  Surgeon: Jerene Bears, MD;  Location: [facility] ENDOSCOPY;  Service: Endoscopy;  Laterality: N/A;  . ESOPHAGOGASTRODUODENOSCOPY N/A 08/02/2016   Procedure: ESOPHAGOGASTRODUODENOSCOPY (EGD);  Surgeon: Gatha Mayer, MD;  Location[facility] ENDOSCOPY;  Service: Endoscopy;  Laterality: N/A;  . EXPLORATORY LAPAROTOMY  1987  . FOOT SURGERY Right ~1980   "don't know what they did; had to take something out"  . INGUINAL HERNIA REPAIR Right ~ 2003  . SHOULDER OPEN ROTATOR CUFF REPAIR Left 1995  . SHOULDER SURGERY Left 1997   "cartilage"  . TONSILLECTOMY AND ADENOIDECTOMY  1976  . TUBAL LIGATION  1998   laparoscopic  . VAGINAL HYSTERECTOMY  04/08/2012   menorrhagia   Social History   Social History Narrative   Works  at urgent care.      Objective: Vital Signs: BP (!) 142/90   Pulse 88   Resp 18   Ht 5\' 6"  (1.676 m)   Wt 198 lb (89.8 kg)   LMP 04/03/2012   BMI 31.96 kg/m    Physical Exam  Constitutional: She is oriented to person, place, and time. She appears well-developed and well-nourished.  HENT:  Head: Normocephalic and atraumatic.  Eyes: Conjunctivae and EOM are normal.  Neck: Normal range of motion.  Cardiovascular: Normal rate, regular rhythm, normal heart sounds and intact distal pulses.   Pulmonary/Chest: Effort normal and breath sounds normal.  Abdominal: Soft. Bowel sounds are normal.  Lymphadenopathy:    She has no cervical adenopathy.  Neurological: She is alert and oriented to person, place, and time.  Skin: Skin is warm and dry. Capillary refill takes less than 2 seconds.  Psychiatric: She has a normal mood and affect. Her behavior is normal.  Nursing note and vitals reviewed.    Musculoskeletal Exam: C-spine and thoracic lumbar spine good range of motion no SI joint tenderness. Shoulder joints, elbow joints, wrist joints, MCPs PIPs DIPs with good range of motion with no synovitis although she has tenderness across her PIP joints. Hip joints, knee joints, ankle joints, MTPs PIPs with good range of motion she has tenderness across her MTPs PIPs with no synovitis.  CDAI Exam: No CDAI exam completed.    Investigation: Findings:  July 2016 ANA negative, rheumatoid factor negative, CCP negative, 08/04/2015 ANA negative, double-stranded DNA negative, C3 normal, C4 normal, 08/08/2016 CBC normal, CMP normal Study Result   CLINICAL DATA:  Central chest pain, in the gastric pain with vomiting of blood  EXAM: CHEST  2 VIEW  COMPARISON:  05/23/2016 CT chest 07/26/2015  FINDINGS: No focal infiltrate or effusion. Mild interstitial prominence within the bilateral lower lung zones, slightly increased compared to prior radiograph. Heart size normal. Mild atherosclerosis of  the aorta. No pneumothorax.  IMPRESSION: 1. No focal infiltrate or edema 2. Slight increased interstitial prominence in the bilateral lower lung zones could relate to mild interstitial inflammation.   Electronically Signed   By: Donavan Foil M.D.   On: 07/31/2016 20:45       Imaging: Xr Foot 2 Views Left  Result Date: 10/24/2016 Mild first MTP PIP/DIP narrowing without any erosive changes. Impression: These findings are consistent with mild osteoarthritis.  Xr Foot 2 Views Right  Result Date: 10/24/2016 Mild first MTP, PIP DIP narrowing without any erosive changes. There is postsurgical changes in her right fifth metatarsal. Impression: These findings are consistent with osteoarthritis  Xr  Hand 2 View Left  Result Date: 10/24/2016 Mild PIP/DIP and CMC narrowing. No MCP joint narrowing or erosive changes were noted. Impression: These findings are consistent with mild osteoarthritis  Xr Hand 2 View Right  Result Date: 10/24/2016 Mild PIP/DIP and CMC narrowing. No MCP joint narrowing or erosive changes were noted. 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    Procedures:  No procedures performed Allergies: Contrast media [iodinated diagnostic agents]; Other; Codeine; Erythromycin; and Iohexol   Assessment / Plan:     Visit Diagnoses: Interstitial lung disease ([place]): I reviewed records at length from [name] today. It seems she may have mild interstitial changes. There is a still caution about reflux causing her symptoms. All autoimmune workup has been negative in the past. She does not have any clinical evidence of autoimmune disease on examination except for mild interstitial changes in her lungs. She continues to have chronic cough. I do not feel comfortable to give her prescription for Imuran without clear evidence of underlying autoimmune disease. I've advised her to get that from Dr.  Ruthann Cancer or she should see a rheumatologist at [facility]. I will refer her to a rheumatologist at [facility]. I will obtain following labs per her request. Patient has not noted any improvement on Imuran.  Pain in both hands - Plan: XR Hand 2 View Right, XR Hand 2 View Left, x-ray showed only mild osteoarthritic changes. ENA 9 Panel, ANA, C3 and C4, Urinalysis, Routine w reflex microscopic, Angiotensin converting enzyme, Sedimentation rate, CANCELED: C3 and C4  Pain in both feet - Plan: XR Foot 2 Views Right, XR Foot 2 Views Left. X-ray showed only mild osteoarthritic changes and a postsurgical changes in her right fifth metatarsal.  She has multiple other medical problems which are listed below:  Hypothyroidism  Depression  Essential hypertension  Dyslipidemia  Gastroesophageal reflux   Family history of lupus erythematosus - In her mother and sister per patient   Chest x-ray showed mild interstitial changes. All autoimmune workup in the past has been negative. Orders: Orders Placed This Encounter  Procedures  . XR Foot 2 Views Right  . XR Foot 2 Views Left  . XR Hand 2 View Right  . XR Hand 2 View Left  . ENA 9 Panel  . ANA  . C3 and C4  . Urinalysis, Routine w reflex microscopic  . Angiotensin converting enzyme  . Sedimentation rate  . Ambulatory referral to Rheumatology   No orders of the defined types were placed in this encounter.   Face-to-face time spent with patient was 60 minutes. 50% of time was spent in counseling and coordination of care.  Follow-Up Instructions: Return for Interstitial lung disease.   Bo Merino, MD

## 2016-10-24 ENCOUNTER — Ambulatory Visit (INDEPENDENT_AMBULATORY_CARE_PROVIDER_SITE_OTHER): Payer: 59

## 2016-10-24 ENCOUNTER — Encounter: Payer: Self-pay | Admitting: Family Medicine

## 2016-10-24 ENCOUNTER — Encounter: Payer: Self-pay | Admitting: Rheumatology

## 2016-10-24 ENCOUNTER — Ambulatory Visit (INDEPENDENT_AMBULATORY_CARE_PROVIDER_SITE_OTHER): Payer: 59 | Admitting: Rheumatology

## 2016-10-24 VITALS — BP 142/90 | HR 88 | Resp 18 | Ht 66.0 in | Wt 198.0 lb

## 2016-10-24 DIAGNOSIS — E785 Hyperlipidemia, unspecified: Secondary | ICD-10-CM

## 2016-10-24 DIAGNOSIS — M79671 Pain in right foot: Secondary | ICD-10-CM

## 2016-10-24 DIAGNOSIS — F331 Major depressive disorder, recurrent, moderate: Secondary | ICD-10-CM | POA: Diagnosis not present

## 2016-10-24 DIAGNOSIS — I10 Essential (primary) hypertension: Secondary | ICD-10-CM | POA: Diagnosis not present

## 2016-10-24 DIAGNOSIS — M79642 Pain in left hand: Secondary | ICD-10-CM

## 2016-10-24 DIAGNOSIS — Z840 Family history of diseases of the skin and subcutaneous tissue: Secondary | ICD-10-CM | POA: Diagnosis not present

## 2016-10-24 DIAGNOSIS — M79672 Pain in left foot: Secondary | ICD-10-CM

## 2016-10-24 DIAGNOSIS — J849 Interstitial pulmonary disease, unspecified: Secondary | ICD-10-CM | POA: Diagnosis not present

## 2016-10-24 DIAGNOSIS — K219 Gastro-esophageal reflux disease without esophagitis: Secondary | ICD-10-CM

## 2016-10-24 DIAGNOSIS — E039 Hypothyroidism, unspecified: Secondary | ICD-10-CM | POA: Diagnosis not present

## 2016-10-24 DIAGNOSIS — M79641 Pain in right hand: Secondary | ICD-10-CM

## 2016-10-24 NOTE — Progress Notes (Deleted)
   Subjective:    Patient ID: Gina Wise, female    DOB: Oct 27, 1960, 56 y.o.   MRN: [id number]  HPI 56 y/o female presents for routine follow up.  Interstitial Lung Disease due to Connective Tissue Disorder/?Lupus Recently established care with Dr. Estanislado Pandy (reviewed note from [id number]), she was uncertain of diagnosis of Lupus/Sjogren's syndrome. Referred to Rheumtology at [facility] as currently Follows with [name] (pulmonology at [facility]). Labs and imaging ordered.    Review of Systems     Objective:   Physical Exam LMP 04/03/2012        Assessment & Plan:  No problem-specific Assessment & Plan notes found for this encounter.

## 2016-10-25 ENCOUNTER — Ambulatory Visit: Payer: 59 | Admitting: Family Medicine

## 2016-10-25 ENCOUNTER — Ambulatory Visit (HOSPITAL_COMMUNITY): Payer: 59

## 2016-10-25 LAB — URINALYSIS, ROUTINE W REFLEX MICROSCOPIC
Bilirubin Urine: NEGATIVE
GLUCOSE, UA: NEGATIVE
Ketones, ur: NEGATIVE
NITRITE: NEGATIVE
PROTEIN: NEGATIVE
Specific Gravity, Urine: 1.021 (ref 1.001–1.035)
pH: 6 (ref 5.0–8.0)

## 2016-10-25 LAB — ANGIOTENSIN CONVERTING ENZYME: ANGIOTENSIN-CONVERTING ENZYME: 20 U/L (ref 9–67)

## 2016-10-25 LAB — URINALYSIS, MICROSCOPIC ONLY
Bacteria, UA: NONE SEEN [HPF]
Casts: NONE SEEN [LPF]
Crystals: NONE SEEN [HPF]
WBC UA: NONE SEEN WBC/HPF (ref <=5)
Yeast: NONE SEEN [HPF]

## 2016-10-25 LAB — C3 AND C4
C3 COMPLEMENT: 164 mg/dL (ref 90–180)
C3 Complement: 168 mg/dL (ref 90–180)
C4 COMPLEMENT: 32 mg/dL (ref 16–47)
C4 Complement: 31 mg/dL (ref 16–47)

## 2016-10-25 LAB — ANA: Anti Nuclear Antibody(ANA): NEGATIVE

## 2016-10-25 LAB — SEDIMENTATION RATE: SED RATE: 5 mm/h (ref 0–30)

## 2016-10-25 NOTE — Progress Notes (Signed)
Labs are normal.

## 2016-10-25 NOTE — Progress Notes (Signed)
Labs normal.

## 2016-10-28 LAB — CP5000020 ENA PANEL
ENA SM AB SER-ACNC: NEGATIVE
RIBONUCLEIC PROTEIN(ENA) ANTIBODY, IGG: NEGATIVE
SCLERODERMA (SCL-70) (ENA) ANTIBODY, IGG: NEGATIVE
SSA (Ro) (ENA) Antibody, IgG: 7.9 — ABNORMAL HIGH
SSB (La) (ENA) Antibody, IgG: <1
ds DNA Ab: 3 IU/mL

## 2016-10-29 NOTE — Progress Notes (Signed)
Her ANA is negative now, ENA shows positive Ro antibody which could be associated with Sjogren's disease. It is difficult to explain her interstitial lung disease. I would like for her to see the tube rheumatologist.

## 2016-11-27 NOTE — Progress Notes (Deleted)
Office Visit Note  Patient: Gina Wise             Date of Birth: Feb 24, 1961           MRN: [id number]             PCP: [name], MD Referring: [name], MD Visit Date: 12/11/2016 Occupation: @GUAROCC @    Subjective:  No chief complaint on file.   History of Present Illness: Gina Wise is a 55 y.o. female ***   Activities of Daily Living:  Patient reports morning stiffness for *** {minute/hour:19697}.   Patient {ACTIONS;DENIES/REPORTS:21021675::"Denies"} nocturnal pain.  Difficulty dressing/grooming: {ACTIONS;DENIES/REPORTS:21021675::"Denies"} Difficulty climbing stairs: {ACTIONS;DENIES/REPORTS:21021675::"Denies"} Difficulty getting out of chair: {ACTIONS;DENIES/REPORTS:21021675::"Denies"} Difficulty using hands for taps, buttons, cutlery, and/or writing: {ACTIONS;DENIES/REPORTS:21021675::"Denies"}   No Rheumatology ROS completed.   PMFS History:  Patient Active Problem List   Diagnosis Date Noted  . Acute gastric erosion   . Unsteadiness on feet 07/13/2016  . Allergic rhinitis 07/13/2016  . Pain in the chest 05/23/2016  . Sinusitis 05/03/2016  . Lentigo 02/17/2016  . Skin lesion 02/17/2016  . Lupus (systemic lupus erythematosus) ([place]) 02/17/2016  . Upper airway cough syndrome 12/16/2015  . Interstitial lung disease due to connective tissue disease ([place]) 11/07/2015  . Frontal sinus pain 11/01/2015  . Hair loss 08/19/2015  . Sjogren's disease ([place])   . Essential hypertension   . Other specified hypothyroidism   . Lung disease with systemic lupus erythematosus ([place]) 07/14/2015  . Rash and nonspecific skin eruption 05/20/2015  . Pain in joint, multiple sites 05/10/2015  . Allergic conjunctivitis 12/13/2014  . Throat clearing 08/10/2014  . Hyperlipidemia 08/10/2014  . Medication side effect 03/30/2014  . Gastroparesis 03/11/2014  . Schatzki's ring 03/09/2014  . Unspecified constipation 02/23/2014  . GERD (gastroesophageal reflux disease) 02/23/2014    . Goiter 07/03/2013  . Cervical lymphadenopathy 06/15/2013  . Headache(784.0) 10/24/2012  . Iron deficiency 03/13/2012  . Fatigue 03/12/2012  . Dysfunctional uterine bleeding 01/02/2012  . Insomnia 10/03/2011  . Dyslipidemia 09/03/2011  . Neck pain, chronic 09/03/2011  . Overweight 01/03/2011  . 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 Migraine 10/04/2009  . Hypothyroidism 09/09/2009  . Essential hypertension, benign 09/09/2009    Past Medical History:  Diagnosis Date  . 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 Allergy   . Anemia   . Bruising 05/10/2015  . CAP (community acquired pneumonia) 08/03/2015  . Chronic bronchitis ([place])    "get it q yr; in the spring time" (02/17/2014)  . Depression    "have taken zoloft since 1996" (02/17/2014)  . Diverticulosis   . Epistaxis 12/01/2015  . Gastritis   . GERD (gastroesophageal reflux disease)   . Grover's disease   . Hematemesis with nausea 08/01/2016  . High cholesterol   . Hypertension   . Hypothyroid   . Migraines    "maybe a couple/yr" (02/17/2014)  . Multiple environmental allergies   . Overweight(278.02)   . Peri-menopause   . Schatzki's ring   . Shingles 02/16/2015  . Sjogren's disease ([place])   . Systemic lupus (HCC)     Family History  Problem Relation Age of Onset  . Hypertension Father   . Emphysema Father   . Vision loss Father   . Alcohol abuse Father   . Hypertension Mother   . Heart disease Mother   . Hepatitis Mother     Hep C  . Endometriosis Mother   . Lupus Sister   . Endometriosis Sister   .  Breast cancer Maternal Aunt     30's  . Breast cancer Paternal Aunt     33's  . Lung cancer Paternal Uncle     smoker  . Colon cancer Maternal Grandfather   . Uterine cancer Maternal Aunt     50's  . Cervical cancer Paternal Aunt     40's  . Lung cancer Paternal Uncle   . Stomach cancer Neg Hx    Past Surgical History:  Procedure Laterality Date  . APPENDECTOMY  1987  .  ESOPHAGOGASTRODUODENOSCOPY N/A 02/28/2014   Procedure: ESOPHAGOGASTRODUODENOSCOPY (EGD);  Surgeon: Jerene Bears, MD;  Location: [facility] ENDOSCOPY;  Service: Endoscopy;  Laterality: N/A;  . ESOPHAGOGASTRODUODENOSCOPY N/A 08/02/2016   Procedure: ESOPHAGOGASTRODUODENOSCOPY (EGD);  Surgeon: Gatha Mayer, MD;  Location: [facility] ENDOSCOPY;  Service: Endoscopy;  Laterality: N/A;  . EXPLORATORY LAPAROTOMY  1987  . FOOT SURGERY Right ~1980   "don't know what they did; had to take something out"  . INGUINAL HERNIA REPAIR Right ~ 2003  . SHOULDER OPEN ROTATOR CUFF REPAIR Left 1995  . SHOULDER SURGERY Left 1997   "cartilage"  . TONSILLECTOMY AND ADENOIDECTOMY  1976  . TUBAL LIGATION  1998   laparoscopic  . VAGINAL HYSTERECTOMY  04/08/2012   menorrhagia   Social History   Social History Narrative   Works at urgent care.      Objective: Vital Signs: LMP 04/03/2012    Physical Exam   Musculoskeletal Exam: ***  CDAI Exam: No CDAI exam completed.    Investigation: No additional findings.  Office Visit on 10/24/2016  Component Date Value Ref Range Status  . C3 Complement 10/24/2016 164  90 - 180 mg/dL Final  . C4 Complement 10/24/2016 31  16 - 47 mg/dL Final  . Anit Nuclear Antibody(ANA) 10/24/2016 NEG  NEGATIVE Final   Comment: Although the specimen was negative for antinuclear antibodies (ANA), the presence of cytoplasmic fluorescence was noted on the HEp-2 slide. Other reactivities (e.g., anti-mitochondrial antibodies or anti-smooth muscle antibodies) may be responsible for this fluorescence.   . C3 Complement 10/24/2016 168  90 - 180 mg/dL Final  . C4 Complement 10/24/2016 32  16 - 47 mg/dL Final  . Color, Urine 10/24/2016 YELLOW  YELLOW Final  . APPearance 10/24/2016 CLEAR  CLEAR Final  . Specific Gravity, Urine 10/24/2016 1.021  1.001 - 1.035 Final  . pH 10/24/2016 6.0  5.0 - 8.0 Final  . Glucose, UA 10/24/2016 NEGATIVE  NEGATIVE Final  . Bilirubin Urine 10/24/2016 NEGATIVE  NEGATIVE  Final  . Ketones, ur 10/24/2016 NEGATIVE  NEGATIVE Final  . Hgb urine dipstick 10/24/2016 TRACE* NEGATIVE Final  . Protein, ur 10/24/2016 NEGATIVE  NEGATIVE Final  . Nitrite 10/24/2016 NEGATIVE  NEGATIVE Final  . Leukocytes, UA 10/24/2016 1+* NEGATIVE Final  . Angiotensin-Converting Enzyme 10/24/2016 20  9 - 67 U/L Final   Comment: ** Please note change in reference range(s). **     . Sed Rate 10/24/2016 5  0 - 30 mm/hr Final  . WBC, UA 10/24/2016 NONE SEEN  <=5 WBC/HPF Final  . RBC / HPF 10/24/2016 0-2  <=2 RBC/HPF Final  . Squamous Epithelial / LPF 10/24/2016 0-5  <=5 HPF Final  . Bacteria, UA 10/24/2016 NONE SEEN  NONE SEEN HPF Final  . Crystals 10/24/2016 NONE SEEN  NONE SEEN HPF Final  . Casts 10/24/2016 NONE SEEN  NONE SEEN LPF Final  . Yeast 10/24/2016 NONE SEEN  NONE SEEN HPF Final  . ds DNA Ab 10/24/2016 3  IU/mL Final   Comment:                                 IU/mL       Interpretation                               < or = 4    Negative                               5-9         Indeterminate                               > or = 10   Positive     . Ribonucleic Protein(ENA) Antibody,* 10/24/2016 <1.0 NEG  <1.0 NEG AI Final  . Scleroderma (Scl-70) (ENA) Antibod* 10/24/2016 <1.0 NEG  <1.0 NEG AI Final  . SSA (Ro) (ENA) Antibody, IgG 10/24/2016 7.9 POS* <1.0 NEG AI Final  . SSB (La) (ENA) Antibody, IgG 10/24/2016 <1.0 NEG  <1.0 NEG AI Final  . ENA SM Ab Ser-aCnc 10/24/2016 <1.0 NEG  <1.0 NEG AI Final    Imaging: No results found.  Speciality Comments: No specialty comments available.    Procedures:  No procedures performed Allergies: Contrast media [iodinated diagnostic agents]; Other; Codeine; Erythromycin; and Iohexol   Assessment / Plan:     Visit Diagnoses: Sjogren's syndrome with keratoconjunctivitis sicca (HCC)  Other fatigue  Other systemic lupus erythematosus with lung involvement (HCC)  Pain in joint, multiple sites  Interstitial lung disease due to  connective tissue disease ([place])  Lung disease with systemic lupus erythematosus ([place])  Neck pain, chronic  Overweight  History of gastroesophageal reflux (GERD)  History of hypertension    Orders: No orders of the defined types were placed in this encounter.  No orders of the defined types were placed in this encounter.   Face-to-face time spent with patient was *** minutes. 50% of time was spent in counseling and coordination of care.  Follow-Up Instructions: No Follow-up on file.   Gina Wise, RT  Note - This record has been created using [employer].  Chart creation errors have been sought, but may not always  have been located. Such creation errors do not reflect on  the standard of medical care.

## 2016-12-01 ENCOUNTER — Other Ambulatory Visit: Payer: Self-pay | Admitting: Family Medicine

## 2016-12-01 DIAGNOSIS — G43811 Other migraine, intractable, with status migrainosus: Secondary | ICD-10-CM

## 2016-12-10 DIAGNOSIS — Z8379 Family history of other diseases of the digestive system: Secondary | ICD-10-CM | POA: Insufficient documentation

## 2016-12-10 DIAGNOSIS — Z8639 Personal history of other endocrine, nutritional and metabolic disease: Secondary | ICD-10-CM | POA: Insufficient documentation

## 2016-12-10 DIAGNOSIS — Z8679 Personal history of other diseases of the circulatory system: Secondary | ICD-10-CM | POA: Insufficient documentation

## 2016-12-10 NOTE — Progress Notes (Signed)
Office Visit Note  Patient: Gina Wise             Date of Birth: 08-Nov-1960           MRN: [id number]             PCP: [name], MD Referring: [name], MD Visit Date: 12/14/2016 Occupation: '@GUAROCC' @    Subjective:  Chronic cough   History of Present Illness: Gina Wise is a 56 y.o. female with history of interstitial lung disease some. She states she continues to have chronic cough. She also has some rash on the back of her neck. She's very stiff in the morning with achiness in her neck and across her shoulders in her upper extremities and the lower extremities. She denies any joint swelling. The rash comes and goes every 12 months.  Activities of Daily Living:  Patient reports morning stiffness for all day hours.   Patient Reports nocturnal pain.  Difficulty dressing/grooming: Reports Difficulty climbing stairs: Reports Difficulty getting out of chair: Reports Difficulty using hands for taps, buttons, cutlery, and/or writing: Reports   Review of Systems  Constitutional: Positive for fatigue and weakness. Negative for night sweats, weight gain and weight loss.  HENT: Positive for mouth dryness. Negative for mouth sores, trouble swallowing, trouble swallowing and nose dryness.   Eyes: Negative for pain, redness, visual disturbance and dryness.  Respiratory: Positive for cough and shortness of breath. Negative for difficulty breathing.   Cardiovascular: Negative for chest pain, palpitations, hypertension, irregular heartbeat and swelling in legs/feet.  Gastrointestinal: Negative for blood in stool, constipation and diarrhea.  Endocrine: Negative for increased urination.  Genitourinary: Negative for vaginal dryness.  Musculoskeletal: Positive for arthralgias, joint pain, myalgias, morning stiffness and myalgias. Negative for joint swelling, muscle weakness and muscle tenderness.  Skin: Positive for rash. Negative for color change, hair loss, skin tightness, ulcers  and sensitivity to sunlight.  Allergic/Immunologic: Negative for susceptible to infections.  Neurological: Negative for dizziness, memory loss and night sweats.  Hematological: Negative for swollen glands.  Psychiatric/Behavioral: Positive for sleep disturbance. Negative for depressed mood. The patient is not nervous/anxious.     PMFS History:  Patient Active Problem List   Diagnosis Date Noted  . History of depression 12/13/2016  . History of esophageal reflux 12/13/2016  . History of hypothyroidism 12/13/2016  . History of hypertension 12/10/2016  . History of hyperlipidemia 12/10/2016  . Family history of GERD 12/10/2016  . Acute gastric erosion   . Unsteadiness on feet 07/13/2016  . Allergic rhinitis 07/13/2016  . Pain in the chest 05/23/2016  . Sinusitis 05/03/2016  . Lentigo 02/17/2016  . Skin lesion 02/17/2016  . Lupus (systemic lupus erythematosus) ([place]) 02/17/2016  . Upper airway cough syndrome 12/16/2015  . Interstitial lung disease due to connective tissue disease ([place]) 11/07/2015  . Frontal sinus pain 11/01/2015  . Hair loss 08/19/2015  . Sjogren's disease ([place])   . Essential hypertension   . Other specified hypothyroidism   . Lung disease with systemic lupus erythematosus ([place]) 07/14/2015  . Rash and nonspecific skin eruption 05/20/2015  . Pain in joint, multiple sites 05/10/2015  . Allergic conjunctivitis 12/13/2014  . Throat clearing 08/10/2014  . Hyperlipidemia 08/10/2014  . Medication side effect 03/30/2014  . Gastroparesis 03/11/2014  . Schatzki's ring 03/09/2014  . Unspecified constipation 02/23/2014  . GERD (gastroesophageal reflux disease) 02/23/2014  . Goiter 07/03/2013  . Cervical lymphadenopathy 06/15/2013  . Chronic cough 01/02/2013  . Headache(784.0) 10/24/2012  . Iron  deficiency 03/13/2012  . Fatigue 03/12/2012  . Dysfunctional uterine bleeding 01/02/2012  . Insomnia 10/03/2011  . Dyslipidemia 09/03/2011  . Neck pain, chronic 09/03/2011    . Overweight 01/03/2011  . 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 Migraine 10/04/2009  . Hypothyroidism 09/09/2009  . Essential hypertension, benign 09/09/2009    Past Medical History:  Diagnosis Date  . 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 Allergy   . Anemia   . Bruising 05/10/2015  . CAP (community acquired pneumonia) 08/03/2015  . Chronic bronchitis ([place])    "get it q yr; in the spring time" (02/17/2014)  . Depression    "have taken zoloft since 1996" (02/17/2014)  . Diverticulosis   . Epistaxis 12/01/2015  . Gastritis   . GERD (gastroesophageal reflux disease)   . Grover's disease   . Hematemesis with nausea 08/01/2016  . High cholesterol   . Hypertension   . Hypothyroid   . Migraines    "maybe a couple/yr" (02/17/2014)  . Multiple environmental allergies   . Overweight(278.02)   . Peri-menopause   . Schatzki's ring   . Shingles 02/16/2015  . Sjogren's disease ([place])   . Systemic lupus ([facility])     Family History  Problem Relation Age of Onset  . Hypertension Father   . Emphysema Father   . Vision loss Father   . Alcohol abuse Father   . Hypertension Mother   . Heart disease Mother   . Hepatitis Mother     Hep C  . Endometriosis Mother   . Lupus Sister   . Endometriosis Sister   . Breast cancer Maternal Aunt     30's  . Breast cancer Paternal Aunt     38's  . Lung cancer Paternal Uncle     smoker  . Colon cancer Maternal Grandfather   . Uterine cancer Maternal Aunt     50's  . Cervical cancer Paternal Aunt     40's  . Lung cancer Paternal Uncle   . Stomach cancer Neg Hx    Past Surgical History:  Procedure Laterality Date  . APPENDECTOMY  1987  . ESOPHAGOGASTRODUODENOSCOPY N/A 02/28/2014   Procedure: ESOPHAGOGASTRODUODENOSCOPY (EGD);  Surgeon: Jerene Bears, MD;  Location: [facility] ENDOSCOPY;  Service: Endoscopy;  Laterality: N/A;  . ESOPHAGOGASTRODUODENOSCOPY N/A 08/02/2016   Procedure: ESOPHAGOGASTRODUODENOSCOPY (EGD);  Surgeon: Gatha Mayer, MD;  Location: [facility] ENDOSCOPY;  Service: Endoscopy;  Laterality: N/A;  . EXPLORATORY LAPAROTOMY  1987  . FOOT SURGERY Right ~1980   "don't know what they did; had to take something out"  . INGUINAL HERNIA REPAIR Right ~ 2003  . SHOULDER OPEN ROTATOR CUFF REPAIR Left 1995  . SHOULDER SURGERY Left 1997   "cartilage"  . TONSILLECTOMY AND ADENOIDECTOMY  1976  . TUBAL LIGATION  1998   laparoscopic  . VAGINAL HYSTERECTOMY  04/08/2012   menorrhagia   Social History   Social History Narrative   Works at urgent care.      Objective: Vital Signs: BP 128/78   Pulse 78   Resp 14   Wt 198 lb (89.8 kg)   LMP 04/03/2012   BMI 31.96 kg/m    Physical Exam  Constitutional: She is oriented to person, place, and time. She appears well-developed and well-nourished.  HENT:  Head: Normocephalic and atraumatic.  Eyes: Conjunctivae and EOM are normal.  Neck: Normal range of motion.  Cardiovascular: Normal rate, regular rhythm, normal heart sounds and intact distal pulses.   Pulmonary/Chest: Effort normal and  breath sounds normal.  Abdominal: Soft. Bowel sounds are normal.  Lymphadenopathy:    She has no cervical adenopathy.  Neurological: She is alert and oriented to person, place, and time.  Skin: Skin is warm and dry. Capillary refill takes less than 2 seconds.  Psychiatric: She has a normal mood and affect. Her behavior is normal.  Nursing note and vitals reviewed.    Musculoskeletal Exam: C-spine, thoracic, lumbar spine good range of motion. Shoulder joints, elbow joints, wrist joints, MCPs PIPs DIPs with good range of motion with no synovitis hip joints knee joints ankles MTPs PIPs DIPs with good range of motion with no synovitis.  CDAI Exam: No CDAI exam completed.    Investigation: No additional findings. Office Visit on 10/24/2016  Component Date Value Ref Range Status  . C3 Complement 10/24/2016 164  90 - 180 mg/dL Final  . C4 Complement 10/24/2016 31  16 - 47 mg/dL  Final  . Anit Nuclear Antibody(ANA) 10/24/2016 NEG  NEGATIVE Final   Comment: Although the specimen was negative for antinuclear antibodies (ANA), the presence of cytoplasmic fluorescence was noted on the HEp-2 slide. Other reactivities (e.g., anti-mitochondrial antibodies or anti-smooth muscle antibodies) may be responsible for this fluorescence.   . C3 Complement 10/24/2016 168  90 - 180 mg/dL Final  . C4 Complement 10/24/2016 32  16 - 47 mg/dL Final  . Color, Urine 10/24/2016 YELLOW  YELLOW Final  . APPearance 10/24/2016 CLEAR  CLEAR Final  . Specific Gravity, Urine 10/24/2016 1.021  1.001 - 1.035 Final  . pH 10/24/2016 6.0  5.0 - 8.0 Final  . Glucose, UA 10/24/2016 NEGATIVE  NEGATIVE Final  . Bilirubin Urine 10/24/2016 NEGATIVE  NEGATIVE Final  . Ketones, ur 10/24/2016 NEGATIVE  NEGATIVE Final  . Hgb urine dipstick 10/24/2016 TRACE* NEGATIVE Final  . Protein, ur 10/24/2016 NEGATIVE  NEGATIVE Final  . Nitrite 10/24/2016 NEGATIVE  NEGATIVE Final  . Leukocytes, UA 10/24/2016 1+* NEGATIVE Final  . Angiotensin-Converting Enzyme 10/24/2016 20  9 - 67 U/L Final   Comment: ** Please note change in reference range(s). **     . Sed Rate 10/24/2016 5  0 - 30 mm/hr Final  . WBC, UA 10/24/2016 NONE SEEN  <=5 WBC/HPF Final  . RBC / HPF 10/24/2016 0-2  <=2 RBC/HPF Final  . Squamous Epithelial / LPF 10/24/2016 0-5  <=5 HPF Final  . Bacteria, UA 10/24/2016 NONE SEEN  NONE SEEN HPF Final  . Crystals 10/24/2016 NONE SEEN  NONE SEEN HPF Final  . Casts 10/24/2016 NONE SEEN  NONE SEEN LPF Final  . Yeast 10/24/2016 NONE SEEN  NONE SEEN HPF Final  . ds DNA Ab 10/24/2016 3  IU/mL Final   Comment:                                 IU/mL       Interpretation                               < or = 4    Negative                               5-9         Indeterminate                               >  or = 10   Positive     . Ribonucleic Protein(ENA) Antibody,* 10/24/2016 <1.0 NEG  <1.0 NEG AI Final  .  Scleroderma (Scl-70) (ENA) Antibod* 10/24/2016 <1.0 NEG  <1.0 NEG AI Final  . SSA (Ro) (ENA) Antibody, IgG 10/24/2016 7.9 POS* <1.0 NEG AI Final  . SSB (La) (ENA) Antibody, IgG 10/24/2016 <1.0 NEG  <1.0 NEG AI Final  . ENA SM Ab Ser-aCnc 10/24/2016 <1.0 NEG  <1.0 NEG AI Final     Imaging: No results found.  Speciality Comments: No specialty comments available.    Procedures:  No procedures performed Allergies: Contrast media [iodinated diagnostic agents]; Other; Codeine; Erythromycin; and Iohexol   Assessment / Plan:     Visit Diagnoses: Interstitial lung disease due to connective tissue disease ([place]) - Negative ANA, RF negative,Positive SS A, CCP negative, ace negative, ESR normal positive history of lupus and mother and sister per patient. Patient is been followed by pulmonologist at [facility]. She is awaiting appointment with the rheumatologist at [facility] as well.  Sicca symptoms: She has positive Ro antibody. I'm uncertain how much discomfort reviewed into her symptoms and if her interstitial lung disease is related to her Sjogren's. Over-the-counter products were discussed at length.  Chronic cough: Of uncertain etiology. The differential diagnosis could be interstitial lung disease, reflux, chronic bronchitis, allergies  High-risk prescription she is on Imuran 200 mg a day and Plaquenil for which she's been followed by [facility] pulmonologist.  Generalized pain and myalgias: She may have  myofascial pain syndrome she had no synovitis on examination. She requested Flexeril prescription. I did discuss with her that Flexeril can cause more drying effect. She states she takes it sparingly only I will give her a prescription for Flexeril 10 mg by mouth daily at bedtime total 20 tablets when necessary. Side effects were discussed at length.  Her other medical problems are listed as follows all and  History of hypertension  History of hyperlipidemia  Hair loss  History of depression  History  of esophageal reflux  History of hypothyroidism    Orders: No orders of the defined types were placed in this encounter.  No orders of the defined types were placed in this encounter.   Face-to-face time spent with patient was 30 minutes. 50% of time was spent in counseling and coordination of care.  Follow-Up Instructions: Return if symptoms worsen or fail to improve, for Sjogren's.   Bo Merino, MD  Note - This record has been created using Editor, commissioning.  Chart creation errors have been sought, but may not always  have been located. Such creation errors do not reflect on  the standard of medical care.

## 2016-12-11 ENCOUNTER — Ambulatory Visit: Payer: Self-pay | Admitting: Rheumatology

## 2016-12-13 DIAGNOSIS — Z8659 Personal history of other mental and behavioral disorders: Secondary | ICD-10-CM | POA: Insufficient documentation

## 2016-12-13 DIAGNOSIS — Z8719 Personal history of other diseases of the digestive system: Secondary | ICD-10-CM | POA: Insufficient documentation

## 2016-12-13 DIAGNOSIS — Z8639 Personal history of other endocrine, nutritional and metabolic disease: Secondary | ICD-10-CM | POA: Insufficient documentation

## 2016-12-14 ENCOUNTER — Ambulatory Visit (INDEPENDENT_AMBULATORY_CARE_PROVIDER_SITE_OTHER): Payer: 59 | Admitting: Rheumatology

## 2016-12-14 ENCOUNTER — Encounter: Payer: Self-pay | Admitting: Rheumatology

## 2016-12-14 VITALS — BP 128/78 | HR 78 | Resp 14 | Wt 198.0 lb

## 2016-12-14 DIAGNOSIS — Z8659 Personal history of other mental and behavioral disorders: Secondary | ICD-10-CM

## 2016-12-14 DIAGNOSIS — L659 Nonscarring hair loss, unspecified: Secondary | ICD-10-CM | POA: Diagnosis not present

## 2016-12-14 DIAGNOSIS — Z8679 Personal history of other diseases of the circulatory system: Secondary | ICD-10-CM | POA: Diagnosis not present

## 2016-12-14 DIAGNOSIS — J8489 Other specified interstitial pulmonary diseases: Secondary | ICD-10-CM

## 2016-12-14 DIAGNOSIS — Z8719 Personal history of other diseases of the digestive system: Secondary | ICD-10-CM

## 2016-12-14 DIAGNOSIS — M358 Other specified systemic involvement of connective tissue: Secondary | ICD-10-CM

## 2016-12-14 DIAGNOSIS — Z8639 Personal history of other endocrine, nutritional and metabolic disease: Secondary | ICD-10-CM

## 2016-12-14 DIAGNOSIS — R05 Cough: Secondary | ICD-10-CM | POA: Diagnosis not present

## 2016-12-14 DIAGNOSIS — R053 Chronic cough: Secondary | ICD-10-CM

## 2016-12-14 MED ORDER — CYCLOBENZAPRINE HCL 10 MG PO TABS: 10.0000 mg | ORAL_TABLET | Freq: Every day | ORAL | 0 refills | Status: DC

## 2016-12-17 ENCOUNTER — Other Ambulatory Visit: Payer: Self-pay | Admitting: Family Medicine

## 2016-12-17 ENCOUNTER — Other Ambulatory Visit: Payer: Self-pay | Admitting: Rheumatology

## 2016-12-17 DIAGNOSIS — G43811 Other migraine, intractable, with status migrainosus: Secondary | ICD-10-CM

## 2016-12-18 ENCOUNTER — Other Ambulatory Visit: Payer: Self-pay | Admitting: *Deleted

## 2016-12-18 ENCOUNTER — Telehealth: Payer: Self-pay | Admitting: Rheumatology

## 2016-12-18 MED ORDER — ZOLPIDEM TARTRATE 5 MG PO TABS: 5.0000 mg | ORAL_TABLET | Freq: Every evening | ORAL | 0 refills | Status: DC | PRN

## 2016-12-18 NOTE — Telephone Encounter (Signed)
Last Visit: 12/14/16 Next Visit due in August. Message sent to the front to schedule patient   Okay to refill Flexeril?

## 2016-12-18 NOTE — Telephone Encounter (Signed)
ok 

## 2016-12-18 NOTE — Telephone Encounter (Signed)
-----   Message from Carole Binning, LPN sent at [id number]  8:23 AM EST ----- Regarding: Please schedule patient for follow up visit Please schedule patient for follow up visit. Patient due August 2018. Thanks!

## 2016-12-18 NOTE — Telephone Encounter (Signed)
Left message on voice mail for patient to call the office to schedule her follow up appointment.

## 2016-12-18 NOTE — Telephone Encounter (Signed)
RN staff - please call in Ambien 5 mg QHS prn sleep, dispense #30, no refills

## 2016-12-19 ENCOUNTER — Emergency Department (HOSPITAL_BASED_OUTPATIENT_CLINIC_OR_DEPARTMENT_OTHER)
Admission: EM | Admit: 2016-12-19 | Discharge: 2016-12-19 | Disposition: A | Discharge status: Home / Self Care | Payer: 59 | Attending: Physician Assistant | Admitting: Physician Assistant

## 2016-12-19 ENCOUNTER — Encounter (HOSPITAL_BASED_OUTPATIENT_CLINIC_OR_DEPARTMENT_OTHER): Payer: Self-pay | Admitting: Emergency Medicine

## 2016-12-19 ENCOUNTER — Emergency Department (HOSPITAL_BASED_OUTPATIENT_CLINIC_OR_DEPARTMENT_OTHER): Payer: 59

## 2016-12-19 DIAGNOSIS — S92212A Displaced fracture of cuboid bone of left foot, initial encounter for closed fracture: Secondary | ICD-10-CM | POA: Diagnosis not present

## 2016-12-19 DIAGNOSIS — Y9301 Activity, walking, marching and hiking: Secondary | ICD-10-CM | POA: Insufficient documentation

## 2016-12-19 DIAGNOSIS — Y999 Unspecified external cause status: Secondary | ICD-10-CM | POA: Insufficient documentation

## 2016-12-19 DIAGNOSIS — I10 Essential (primary) hypertension: Secondary | ICD-10-CM | POA: Diagnosis not present

## 2016-12-19 DIAGNOSIS — S99922A Unspecified injury of left foot, initial encounter: Secondary | ICD-10-CM | POA: Diagnosis present

## 2016-12-19 DIAGNOSIS — Y929 Unspecified place or not applicable: Secondary | ICD-10-CM | POA: Diagnosis not present

## 2016-12-19 DIAGNOSIS — Z79899 Other long term (current) drug therapy: Secondary | ICD-10-CM | POA: Diagnosis not present

## 2016-12-19 DIAGNOSIS — X58XXXA Exposure to other specified factors, initial encounter: Secondary | ICD-10-CM | POA: Diagnosis not present

## 2016-12-19 DIAGNOSIS — M79672 Pain in left foot: Secondary | ICD-10-CM | POA: Insufficient documentation

## 2016-12-19 DIAGNOSIS — E039 Hypothyroidism, unspecified: Secondary | ICD-10-CM | POA: Insufficient documentation

## 2016-12-19 NOTE — ED Provider Notes (Signed)
[facility] DEPT [facility] Provider Note   CSN: [id number] Arrival date & time: 12/19/16  1529   By signing my name below, I, Charolotte Eke, attest that this documentation has been prepared under the direction and in the presence of Harlene Ramus, [place]. Electronically Signed: Charolotte Eke, Scribe. 12/19/16. 4:36 PM.    History   Chief Complaint Chief Complaint  Patient presents with  . Foot Pain    left    HPI Gina Wise is a 56 y.o. female with h/o HTN, GERD, schatzki's ring who presents to the Emergency Department complaining of acute onset, moderate, constant, throbbing left foot pain that started on 12/15/16. She states that she walking her son's dog and notes the dog pulled her resulting in her landing with all of her weight on her left foot and then she rolled the ball of her foot on the curb. The pain radiates to the big toe. Pt has not injured this foot before. Pt has associated tingling of her toes. Pt has been taking Tylenol with momentary, slight relief. Pt has been using ice and trying to keep her foot elevated. Patient reports she is unable to take NSAIDs due to her history of GERD.  The history is provided by the patient. No language interpreter was used.    Past Medical History:  Diagnosis Date  . 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 Allergy   . Anemia   . Bruising 05/10/2015  . CAP (community acquired pneumonia) 08/03/2015  . Chronic bronchitis ([place])    "get it q yr; in the spring time" (02/17/2014)  . Depression    "have taken zoloft since 1996" (02/17/2014)  . Diverticulosis   . Epistaxis 12/01/2015  . Gastritis   . GERD (gastroesophageal reflux disease)   . Grover's disease   . Hematemesis with nausea 08/01/2016  . High cholesterol   . Hypertension   . Hypothyroid   . Migraines    "maybe a couple/yr" (02/17/2014)  . Multiple environmental allergies   . Overweight(278.02)   . Peri-menopause   . Schatzki's ring   . Shingles 02/16/2015  . Sjogren's disease ([place])   .  Systemic lupus [facility])     Patient Active Problem List   Diagnosis Date Noted  . History of depression 12/13/2016  . History of esophageal reflux 12/13/2016  . History of hypothyroidism 12/13/2016  . History of hypertension 12/10/2016  . History of hyperlipidemia 12/10/2016  . Family history of GERD 12/10/2016  . Acute gastric erosion   . Unsteadiness on feet 07/13/2016  . Allergic rhinitis 07/13/2016  . Pain in the chest 05/23/2016  . Sinusitis 05/03/2016  . Lentigo 02/17/2016  . Skin lesion 02/17/2016  . Lupus (systemic lupus erythematosus) ([place]) 02/17/2016  . Upper airway cough syndrome 12/16/2015  . Interstitial lung disease due to connective tissue disease ([place]) 11/07/2015  . Frontal sinus pain 11/01/2015  . Hair loss 08/19/2015  . Sjogren's disease ([place])   . Essential hypertension   . Other specified hypothyroidism   . Lung disease with systemic lupus erythematosus ([place]) 07/14/2015  . Rash and nonspecific skin eruption 05/20/2015  . Pain in joint, multiple sites 05/10/2015  . Allergic conjunctivitis 12/13/2014  . Throat clearing 08/10/2014  . Hyperlipidemia 08/10/2014  . Medication side effect 03/30/2014  . Gastroparesis 03/11/2014  . Schatzki's ring 03/09/2014  . Unspecified constipation 02/23/2014  . GERD (gastroesophageal reflux disease) 02/23/2014  . Goiter 07/03/2013  . Cervical lymphadenopathy 06/15/2013  . Chronic cough 01/02/2013  . Headache(784.0) 10/24/2012  . Iron  deficiency 03/13/2012  . Fatigue 03/12/2012  . Dysfunctional uterine bleeding 01/02/2012  . Insomnia 10/03/2011  . Dyslipidemia 09/03/2011  . Neck pain, chronic 09/03/2011  . Overweight 01/03/2011  . 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 Migraine 10/04/2009  . Hypothyroidism 09/09/2009  . Essential hypertension, benign 09/09/2009    Past Surgical History:  Procedure Laterality Date  . APPENDECTOMY  1987  . ESOPHAGOGASTRODUODENOSCOPY N/A 02/28/2014   Procedure:  ESOPHAGOGASTRODUODENOSCOPY (EGD);  Surgeon: Jerene Bears, MD;  Location: [facility] ENDOSCOPY;  Service: Endoscopy;  Laterality: N/A;  . ESOPHAGOGASTRODUODENOSCOPY N/A 08/02/2016   Procedure: ESOPHAGOGASTRODUODENOSCOPY (EGD);  Surgeon: Gatha Mayer, MD;  Location: [facility] ENDOSCOPY;  Service: Endoscopy;  Laterality: N/A;  . EXPLORATORY LAPAROTOMY  1987  . FOOT SURGERY Right ~1980   "don't know what they did; had to take something out"  . INGUINAL HERNIA REPAIR Right ~ 2003  . SHOULDER OPEN ROTATOR CUFF REPAIR Left 1995  . SHOULDER SURGERY Left 1997   "cartilage"  . TONSILLECTOMY AND ADENOIDECTOMY  1976  . TUBAL LIGATION  1998   laparoscopic  . VAGINAL HYSTERECTOMY  04/08/2012   menorrhagia    OB History    Gravida Para Term Preterm AB Living   2 2 2     2    SAB TAB Ectopic Multiple Live Births                   Home Medications    Prior to Admission medications   Medication Sig Start Date End Date Taking? Authorizing Provider  azaTHIOprine (IMURAN) 50 MG tablet Take 100 mg by mouth 2 (two) times daily.  01/26/16  Yes Historical Provider, MD  b complex vitamins capsule Take 1 capsule by mouth daily.   Yes Historical Provider, MD  Biotin 1000 MCG tablet Take by mouth.   Yes Historical Provider, MD  diphenhydrAMINE (BENADRYL) 25 MG tablet Take 25 mg by mouth every 6 (six) hours as needed for itching or allergies.   Yes Historical Provider, MD  esomeprazole (NEXIUM) 40 MG capsule Take 1 capsule (40 mg total) by mouth 2 (two) times daily before a meal. 12/16/15  Yes Tanda Rockers, MD  hydrochlorothiazide (HYDRODIURIL) 25 MG tablet Take 1 tablet (25 mg total) by mouth daily. 03/13/16  Yes [name], MD  hydroxychloroquine (PLAQUENIL) 200 MG tablet Take 200 mg by mouth 2 (two) times daily.    Yes Historical Provider, MD  levothyroxine (SYNTHROID, LEVOTHROID) 112 MCG tablet TAKE 1 TABLET BY MOUTH  DAILY BEFORE BREAKFAST 10/02/16  Yes [name], MD  loratadine (CLARITIN) 10 MG tablet Take 10 mg  by mouth daily.   Yes Historical Provider, MD  sertraline (ZOLOFT) 100 MG tablet TAKE ONE-HALF TABLET BY  MOUTH DAILY 12/03/16  Yes [name], MD  Vitamin D, Cholecalciferol, 400 UNITS TABS Take 400 Units by mouth daily.   Yes Historical Provider, MD  albuterol (PROVENTIL HFA;VENTOLIN HFA) 108 (90 Base) MCG/ACT inhaler Inhale 2 puffs into the lungs every 6 (six) hours as needed for wheezing or shortness of breath. 05/23/16   Elberta Leatherwood, MD  ondansetron (ZOFRAN ODT) 4 MG disintegrating tablet Take 1 tablet (4 mg total) by mouth every 8 (eight) hours as needed for nausea. 08/10/16   Leeanne Rio, MD  promethazine (PHENERGAN) 12.5 MG tablet TAKE 1 TABLET BY MOUTH  EVERY 8 HOURS AS NEEDED FOR NAUSEA OR VOMITING 12/18/16   [name], MD  SUMAtriptan (IMITREX) 100 MG tablet TAKE  1 TABLET BY MOUTH  EVERY 2 HOURS AS NEEDED FOR MIGRAINE(S) AS DIRECTED 12/03/16   [name], MD    Family History Family History  Problem Relation Age of Onset  . Hypertension Father   . Emphysema Father   . Vision loss Father   . Alcohol abuse Father   . Hypertension Mother   . Heart disease Mother   . Hepatitis Mother     Hep C  . Endometriosis Mother   . Lupus Sister   . Endometriosis Sister   . Breast cancer Maternal Aunt     30's  . Breast cancer Paternal Aunt     74's  . Lung cancer Paternal Uncle     smoker  . Colon cancer Maternal Grandfather   . Uterine cancer Maternal Aunt     50's  . Cervical cancer Paternal Aunt     40's  . Lung cancer Paternal Uncle   . Stomach cancer Neg Hx     Social History Social History  Substance Use Topics  . Smoking status: Never Smoker  . Smokeless tobacco: Never Used  . Alcohol use 0.0 oz/week     Comment: 02/17/2014 "might have a few drinks a couple times/yr"     Allergies   Contrast media [iodinated diagnostic agents]; Other; Codeine; Erythromycin; and Iohexol   Review of Systems Review of Systems  Musculoskeletal: Positive for  arthralgias and myalgias.  Skin: Negative for wound.  Neurological: Negative for weakness.     Physical Exam Updated Vital Signs BP 142/88 (BP Location: Left Arm)   Pulse 79   Temp 97.9 F (36.6 C) (Oral)   Resp 18   Ht 5\' 6"  (1.676 m)   Wt 89.8 kg   LMP 04/03/2012   SpO2 99%   BMI 31.96 kg/m   Physical Exam  Constitutional: She is oriented to person, place, and time. She appears well-developed and well-nourished.  HENT:  Head: Normocephalic and atraumatic.  Eyes: Conjunctivae and EOM are normal. Right eye exhibits no discharge. Left eye exhibits no discharge. No scleral icterus.  Cardiovascular: Normal rate and intact distal pulses.   Pulmonary/Chest: Effort normal.  Musculoskeletal: Normal range of motion. She exhibits tenderness. She exhibits no edema or deformity.  Tenderness to palpation over plantar aspect of left 1st MTP joint. Mild swelling present. Full ROM of left toes, foot, ankle, and knee with 5/5 strength. Sensation grossly intact. 2+ DP pulse. Cap refill <2.  Neurological: She is alert and oriented to person, place, and time.  Skin: Skin is warm and dry. Capillary refill takes less than 2 seconds.  Nursing note and vitals reviewed.    ED Treatments / Results   DIAGNOSTIC STUDIES: Oxygen Saturation is 99% on room air, normal by my interpretation.    COORDINATION OF CARE: 4:20 PM Discussed treatment plan with pt at bedside and pt agreed to plan.   Labs (all labs ordered are listed, but only abnormal results are displayed) Labs Reviewed - No data to display  EKG  EKG Interpretation None       Radiology Dg Foot Complete Left  Result Date: 12/19/2016 CLINICAL DATA:  Foot pain. EXAM: LEFT FOOT - COMPLETE 3+ VIEW COMPARISON:  10/24/2016. FINDINGS: Subtle fracture of the base of the cuboid versus secondary ossification center noted. No change from prior exam . No other focal bony abnormalities identified. No evidence of dislocation. IMPRESSION: Subtle  fracture of the base of the cuboid versus secondary ossification center noted . No change from  prior exam. Electronically Signed   By: Marcello Moores  Register   On: 12/19/2016 16:12    Procedures Procedures (including critical care time)  Medications Ordered in ED Medications - No data to display   Initial Impression / Assessment and Plan / ED Course  I have reviewed the triage vital signs and the nursing notes.  Pertinent labs & imaging results that were available during my care of the patient were reviewed by me and considered in my medical decision making (see chart for details).     Patient X-Ray negative for obvious fracture or dislocation. Pain managed in ED. Pt advised to follow up with orthopedics if symptoms persist for possibility of missed fracture diagnosis. Patient given brace while in ED, conservative therapy recommended and discussed. Patient will be dc home & is agreeable with above plan.   Final Clinical Impressions(s) / ED Diagnoses   Final diagnoses:  Foot pain, left    New Prescriptions New Prescriptions   No medications on file   I personally performed the services described in this documentation, which was scribed in my presence. The recorded information has been reviewed and is accurate.     Chesley Noon [place], [place] 12/19/16 1654    [place], MD 12/19/16 [phone]

## 2016-12-19 NOTE — ED Triage Notes (Signed)
Patient states she hurt her left foot while walking the dog on saturday. She states she has tried tylenol, elevation, and ice all with temporary relief. Patient limping when she entered triage.

## 2016-12-19 NOTE — Discharge Instructions (Signed)
Continue taking Tylenol as prescribed over-the-counter as needed for pain relief. I recommend continuing to rest, elevate and ice her foot for 15-20 minutes 3-4 times daily. I recommend trying to refrain from wearing tight fitting shoes for the next few days until her symptoms have improved. Follow-up with the podiatry clinic listed below in the next week if your symptoms have not improved. Return to the emergency department if symptoms worsen or new onset of fever, redness, swelling, numbness, weakness, decreased range of motion.

## 2016-12-19 NOTE — Telephone Encounter (Signed)
Rx called into pharmacy. Deseree Kennon Holter, CMA

## 2016-12-26 ENCOUNTER — Emergency Department (HOSPITAL_BASED_OUTPATIENT_CLINIC_OR_DEPARTMENT_OTHER)
Admission: EM | Admit: 2016-12-26 | Discharge: 2016-12-26 | Disposition: A | Discharge status: Home / Self Care | Payer: 59 | Attending: Physician Assistant | Admitting: Physician Assistant

## 2016-12-26 ENCOUNTER — Encounter (HOSPITAL_BASED_OUTPATIENT_CLINIC_OR_DEPARTMENT_OTHER): Payer: Self-pay

## 2016-12-26 ENCOUNTER — Emergency Department (HOSPITAL_BASED_OUTPATIENT_CLINIC_OR_DEPARTMENT_OTHER): Payer: 59

## 2016-12-26 DIAGNOSIS — R079 Chest pain, unspecified: Secondary | ICD-10-CM | POA: Diagnosis present

## 2016-12-26 DIAGNOSIS — E039 Hypothyroidism, unspecified: Secondary | ICD-10-CM | POA: Insufficient documentation

## 2016-12-26 DIAGNOSIS — R0789 Other chest pain: Secondary | ICD-10-CM | POA: Diagnosis not present

## 2016-12-26 DIAGNOSIS — Z79899 Other long term (current) drug therapy: Secondary | ICD-10-CM | POA: Diagnosis not present

## 2016-12-26 DIAGNOSIS — M542 Cervicalgia: Secondary | ICD-10-CM | POA: Insufficient documentation

## 2016-12-26 DIAGNOSIS — R072 Precordial pain: Secondary | ICD-10-CM | POA: Insufficient documentation

## 2016-12-26 DIAGNOSIS — I10 Essential (primary) hypertension: Secondary | ICD-10-CM | POA: Diagnosis not present

## 2016-12-26 LAB — COMPREHENSIVE METABOLIC PANEL
ALT: 16 U/L (ref 14–54)
ANION GAP: 12 (ref 5–15)
AST: 28 U/L (ref 15–41)
Albumin: 4.7 g/dL (ref 3.5–5.0)
Alkaline Phosphatase: 60 U/L (ref 38–126)
BILIRUBIN TOTAL: 0.7 mg/dL (ref 0.3–1.2)
BUN: 21 mg/dL — AB (ref 6–20)
CO2: 23 mmol/L (ref 22–32)
Calcium: 9.7 mg/dL (ref 8.9–10.3)
Chloride: 102 mmol/L (ref 101–111)
Creatinine, Ser: 0.83 mg/dL (ref 0.44–1.00)
Glucose, Bld: 94 mg/dL (ref 65–99)
POTASSIUM: 3.5 mmol/L (ref 3.5–5.1)
Sodium: 137 mmol/L (ref 135–145)
TOTAL PROTEIN: 8.4 g/dL — AB (ref 6.5–8.1)

## 2016-12-26 LAB — CBC WITH DIFFERENTIAL/PLATELET
Basophils Absolute: 0 10*3/uL (ref 0.0–0.1)
Basophils Relative: 0 %
EOS PCT: 3 %
Eosinophils Absolute: 0.3 10*3/uL (ref 0.0–0.7)
HEMATOCRIT: 41 % (ref 36.0–46.0)
Hemoglobin: 14.3 g/dL (ref 12.0–15.0)
LYMPHS ABS: 1.4 10*3/uL (ref 0.7–4.0)
LYMPHS PCT: 14 %
MCH: 30.1 pg (ref 26.0–34.0)
MCHC: 34.9 g/dL (ref 30.0–36.0)
MCV: 86.3 fL (ref 78.0–100.0)
MONO ABS: 1.2 10*3/uL — AB (ref 0.1–1.0)
Monocytes Relative: 12 %
NEUTROS ABS: 7.1 10*3/uL (ref 1.7–7.7)
Neutrophils Relative %: 71 %
PLATELETS: 298 10*3/uL (ref 150–400)
RBC: 4.75 MIL/uL (ref 3.87–5.11)
RDW: 13.6 % (ref 11.5–15.5)
WBC: 9.9 10*3/uL (ref 4.0–10.5)

## 2016-12-26 LAB — D-DIMER, QUANTITATIVE (NOT AT ARMC): D DIMER QUANT: 0.33 ug{FEU}/mL (ref 0.00–0.50)

## 2016-12-26 LAB — TROPONIN I

## 2016-12-26 MED ORDER — ACETAMINOPHEN 325 MG PO TABS
650.0000 mg | ORAL_TABLET | Freq: Once | ORAL | Status: AC
  Administered 2016-12-26: 650 mg via ORAL
  Filled 2016-12-26: qty 2

## 2016-12-26 MED ORDER — ONDANSETRON 4 MG PO TBDP
4.0000 mg | ORAL_TABLET | Freq: Once | ORAL | Status: AC
  Administered 2016-12-26: 4 mg via ORAL
  Filled 2016-12-26: qty 1

## 2016-12-26 MED ORDER — SODIUM CHLORIDE 0.9 % IV BOLUS (SEPSIS)
1000.0000 mL | Freq: Once | INTRAVENOUS | Status: AC
  Administered 2016-12-26: 1000 mL via INTRAVENOUS

## 2016-12-26 MED ORDER — OXYCODONE-ACETAMINOPHEN 5-325 MG PO TABS
1.0000 | ORAL_TABLET | Freq: Once | ORAL | Status: AC
  Administered 2016-12-26: 1 via ORAL
  Filled 2016-12-26: qty 1

## 2016-12-26 MED ORDER — ACETAMINOPHEN 500 MG PO TABS: 500.0000 mg | ORAL_TABLET | Freq: Four times a day (QID) | ORAL | 0 refills | Status: DC | PRN

## 2016-12-26 MED ORDER — CYCLOBENZAPRINE HCL 10 MG PO TABS: 10.0000 mg | ORAL_TABLET | Freq: Two times a day (BID) | ORAL | 0 refills | Status: DC | PRN

## 2016-12-26 NOTE — ED Notes (Signed)
[facility] contacted @ [phone]

## 2016-12-26 NOTE — ED Provider Notes (Signed)
[facility] DEPT [facility] Provider Note   CSN: [id number] Arrival date & time: 12/26/16  1655  By signing my name below, I, Gina Wise, attest that this documentation has been prepared under the direction and in the presence of Ariba Lehnen Julio Alm, MD. Electronically Signed: Jeanell Wise, Scribe. 12/26/2016. 8:58 PM.  History   Chief Complaint Chief Complaint  Patient presents with  . Chest Pain   The history is provided by the patient. No language interpreter was used.   HPI Comments: Gina Wise is a 56 y.o. female who presents to the Emergency Department complaining of intermittent moderate left-sided chest pain that started today. She  states she noticed the pain while she was at work. Her pain is non-exertional and radiates to her left shoulder and neck. She denies any cough, SOB, nausea, vomiting, or other complaints.     PCP: [name], MD  Past Medical History:  Diagnosis Date  . 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 Allergy   . Anemia   . Bruising 05/10/2015  . CAP (community acquired pneumonia) 08/03/2015  . Chronic bronchitis ([place])    "get it q yr; in the spring time" (02/17/2014)  . Depression    "have taken zoloft since 1996" (02/17/2014)  . Diverticulosis   . Epistaxis 12/01/2015  . Gastritis   . GERD (gastroesophageal reflux disease)   . Grover's disease   . Hematemesis with nausea 08/01/2016  . High cholesterol   . Hypertension   . Hypothyroid   . Migraines    "maybe a couple/yr" (02/17/2014)  . Multiple environmental allergies   . Overweight(278.02)   . Peri-menopause   . Schatzki's ring   . Shingles 02/16/2015  . Sjogren's disease ([place])   . Systemic lupus [facility])     Patient Active Problem List   Diagnosis Date Noted  . History of depression 12/13/2016  . History of esophageal reflux 12/13/2016  . History of hypothyroidism 12/13/2016  . History of hypertension 12/10/2016  . History of hyperlipidemia 12/10/2016  . Family history of GERD  12/10/2016  . Acute gastric erosion   . Unsteadiness on feet 07/13/2016  . Allergic rhinitis 07/13/2016  . Pain in the chest 05/23/2016  . Sinusitis 05/03/2016  . Lentigo 02/17/2016  . Skin lesion 02/17/2016  . Lupus (systemic lupus erythematosus) ([place]) 02/17/2016  . Upper airway cough syndrome 12/16/2015  . Interstitial lung disease due to connective tissue disease ([place]) 11/07/2015  . Frontal sinus pain 11/01/2015  . Hair loss 08/19/2015  . Sjogren's disease ([place])   . Essential hypertension   . Other specified hypothyroidism   . Lung disease with systemic lupus erythematosus ([place]) 07/14/2015  . Rash and nonspecific skin eruption 05/20/2015  . Pain in joint, multiple sites 05/10/2015  . Allergic conjunctivitis 12/13/2014  . Throat clearing 08/10/2014  . Hyperlipidemia 08/10/2014  . Medication side effect 03/30/2014  . Gastroparesis 03/11/2014  . Schatzki's ring 03/09/2014  . Unspecified constipation 02/23/2014  . GERD (gastroesophageal reflux disease) 02/23/2014  . Goiter 07/03/2013  . Cervical lymphadenopathy 06/15/2013  . Chronic cough 01/02/2013  . Headache(784.0) 10/24/2012  . Iron deficiency 03/13/2012  . Fatigue 03/12/2012  . Dysfunctional uterine bleeding 01/02/2012  . Insomnia 10/03/2011  . Dyslipidemia 09/03/2011  . Neck pain, chronic 09/03/2011  . Overweight 01/03/2011  . 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 Migraine 10/04/2009  . Hypothyroidism 09/09/2009  . Essential hypertension, benign 09/09/2009    Past Surgical History:  Procedure Laterality Date  . APPENDECTOMY  1987  .  ESOPHAGOGASTRODUODENOSCOPY N/A 02/28/2014   Procedure: ESOPHAGOGASTRODUODENOSCOPY (EGD);  Surgeon: Jerene Bears, MD;  Location[facility] ENDOSCOPY;  Service: Endoscopy;  Laterality: N/A;  . ESOPHAGOGASTRODUODENOSCOPY N/A 08/02/2016   Procedure: ESOPHAGOGASTRODUODENOSCOPY (EGD);  Surgeon: Gatha Mayer, MD;  Location: [facility] ENDOSCOPY;  Service: Endoscopy;  Laterality:  N/A;  . EXPLORATORY LAPAROTOMY  1987  . FOOT SURGERY Right ~1980   "don't know what they did; had to take something out"  . INGUINAL HERNIA REPAIR Right ~ 2003  . SHOULDER OPEN ROTATOR CUFF REPAIR Left 1995  . SHOULDER SURGERY Left 1997   "cartilage"  . TONSILLECTOMY AND ADENOIDECTOMY  1976  . TUBAL LIGATION  1998   laparoscopic  . VAGINAL HYSTERECTOMY  04/08/2012   menorrhagia    OB History    Gravida Para Term Preterm AB Living   2 2 2     2    SAB TAB Ectopic Multiple Live Births                   Home Medications    Prior to Admission medications   Medication Sig Start Date End Date Taking? Authorizing Provider  albuterol (PROVENTIL HFA;VENTOLIN HFA) 108 (90 Base) MCG/ACT inhaler Inhale 2 puffs into the lungs every 6 (six) hours as needed for wheezing or shortness of breath. 05/23/16   Elberta Leatherwood, MD  azaTHIOprine (IMURAN) 50 MG tablet Take 100 mg by mouth 2 (two) times daily.  01/26/16   Historical Provider, MD  b complex vitamins capsule Take 1 capsule by mouth daily.    Historical Provider, MD  Biotin 1000 MCG tablet Take by mouth.    Historical Provider, MD  diphenhydrAMINE (BENADRYL) 25 MG tablet Take 25 mg by mouth every 6 (six) hours as needed for itching or allergies.    Historical Provider, MD  esomeprazole (NEXIUM) 40 MG capsule Take 1 capsule (40 mg total) by mouth 2 (two) times daily before a meal. 12/16/15   Tanda Rockers, MD  hydrochlorothiazide (HYDRODIURIL) 25 MG tablet Take 1 tablet (25 mg total) by mouth daily. 03/13/16   [name], MD  hydroxychloroquine (PLAQUENIL) 200 MG tablet Take 200 mg by mouth 2 (two) times daily.     Historical Provider, MD  levothyroxine (SYNTHROID, LEVOTHROID) 112 MCG tablet TAKE 1 TABLET BY MOUTH  DAILY BEFORE BREAKFAST 10/02/16   [name], MD  loratadine (CLARITIN) 10 MG tablet Take 10 mg by mouth daily.    Historical Provider, MD  ondansetron (ZOFRAN ODT) 4 MG disintegrating tablet Take 1 tablet (4 mg total) by mouth every  8 (eight) hours as needed for nausea. 08/10/16   Leeanne Rio, MD  promethazine (PHENERGAN) 12.5 MG tablet TAKE 1 TABLET BY MOUTH  EVERY 8 HOURS AS NEEDED FOR NAUSEA OR VOMITING 12/18/16   [name], MD  sertraline (ZOLOFT) 100 MG tablet TAKE ONE-HALF TABLET BY  MOUTH DAILY 12/03/16   [name], MD  SUMAtriptan (IMITREX) 100 MG tablet TAKE 1 TABLET BY MOUTH  EVERY 2 HOURS AS NEEDED FOR MIGRAINE(S) AS DIRECTED 12/03/16   [name], MD  Vitamin D, Cholecalciferol, 400 UNITS TABS Take 400 Units by mouth daily.    Historical Provider, MD    Family History Family History  Problem Relation Age of Onset  . Hypertension Father   . Emphysema Father   . Vision loss Father   . Alcohol abuse Father   . Hypertension Mother   . Heart disease Mother   . Hepatitis  Mother     Hep C  . Endometriosis Mother   . Lupus Sister   . Endometriosis Sister   . Breast cancer Maternal Aunt     30's  . Breast cancer Paternal Aunt     68's  . Lung cancer Paternal Uncle     smoker  . Colon cancer Maternal Grandfather   . Uterine cancer Maternal Aunt     50's  . Cervical cancer Paternal Aunt     40's  . Lung cancer Paternal Uncle   . Stomach cancer Neg Hx     Social History Social History  Substance Use Topics  . Smoking status: Never Smoker  . Smokeless tobacco: Never Used  . Alcohol use 0.0 oz/week     Comment: occ     Allergies   Contrast media [iodinated diagnostic agents]; Other; Codeine; Erythromycin; and Iohexol   Review of Systems Review of Systems  Respiratory: Negative for cough and shortness of breath.   Cardiovascular: Positive for chest pain.  Gastrointestinal: Negative for nausea and vomiting.  Musculoskeletal: Positive for neck pain.  All other systems reviewed and are negative.    Physical Exam Updated Vital Signs BP 136/69   Pulse 86   Temp 98.2 F (36.8 C) (Oral)   Resp 20   Ht 5\' 6"  (1.676 m)   Wt 197 lb (89.4 kg)   LMP 04/03/2012   SpO2 97%    BMI 31.80 kg/m   Physical Exam  Constitutional: She appears well-developed and well-nourished. No distress.  HENT:  Head: Normocephalic.  Eyes: Conjunctivae are normal.  Neck: Neck supple.  Cardiovascular: Normal rate and regular rhythm.   Pulmonary/Chest: Effort normal.  Abdominal: Soft.  Musculoskeletal: Normal range of motion.  Palpation of the left sternal border reproduces the pain.   Neurological: She is alert.  Skin: Skin is warm and dry.  Psychiatric: She has a normal mood and affect.  Nursing note and vitals reviewed.    ED Treatments / Results  DIAGNOSTIC STUDIES: Oxygen Saturation is 97% on RA, normal by my interpretation.    COORDINATION OF CARE: 9:02 PM- Pt advised of plan for treatment and pt agrees.  Labs (all labs ordered are listed, but only abnormal results are displayed) Labs Reviewed  CBC WITH DIFFERENTIAL/PLATELET - Abnormal; Notable for the following:       Result Value   Monocytes Absolute 1.2 (*)    All other components within normal limits  COMPREHENSIVE METABOLIC PANEL - Abnormal; Notable for the following:    BUN 21 (*)    Total Protein 8.4 (*)    All other components within normal limits  TROPONIN I  D-DIMER, QUANTITATIVE (NOT AT [facility])    EKG  EKG Interpretation None       Radiology Dg Chest 2 View  Result Date: 12/26/2016 CLINICAL DATA:  Chest pain since this morning. Unable to raise left arm completely. EXAM: CHEST  2 VIEW COMPARISON:  Chest CT 07/26/2015, CXR 07/31/2016 FINDINGS: Heart and mediastinal contours are stable with aortic atherosclerosis. There is slight uncoiling of the thoracic aorta without aneurysm. Chronic bilateral interstitial prominence is again noted without pneumonic consolidation, effusion or pneumothorax. Irregular density overlying the anterior right first rib is stable likely representing costochondral calcification. Osteoarthritic spurring is noted off the inferomedial left humeral head. IMPRESSION: 1. No  active pulmonary disease. 2. Mild chronic interstitial lung disease bilaterally. 3. Aortic atherosclerosis without aneurysm. 4. Osteoarthritis of the left glenohumeral joint with spurring off the humeral head. Electronically  Signed   By: Ashley Royalty M.D.   On: 12/26/2016 20:42    Procedures Procedures (including critical care time)  Medications Ordered in ED Medications  sodium chloride 0.9 % bolus 1,000 mL (1,000 mLs Intravenous New Bag/Given 12/26/16 1749)  acetaminophen (TYLENOL) tablet 650 mg (650 mg Oral Given 12/26/16 1807)     Initial Impression / Assessment and Plan / ED Course  I have reviewed the triage vital signs and the nursing notes.  Pertinent labs & imaging results that were available during my care of the patient were reviewed by me and considered in my medical decision making (see chart for details).     I personally performed the services described in this documentation, which was scribed in my presence. The recorded information has been reviewed and is accurate.   Pt is well appearing 56 year old female presenting with reproducible CP and trap pain to the left side. Very tender to touch diffusely. Non ischemic EKG, neg troponin, D dimer negative. Normal labs and chest xray negative.  Likely msk pain.   Final Clinical Impressions(s) / ED Diagnoses   Final diagnoses:  None    New Prescriptions New Prescriptions   No medications on file     Cristela Stalder Julio Alm, MD 12/26/16 2306

## 2016-12-26 NOTE — ED Triage Notes (Addendum)
C/o CP, SOB x today-recent sx of runny nose-chronic cough-pt anxious upon arrival-clutching chest-winces when she stands from w/c to scale-states pain worse with deep breaths-pt states pain has been all day-worse since 15 min PTA

## 2016-12-26 NOTE — Discharge Instructions (Signed)
We are very sorry about your discomfort today. Your labs were all very reassuring. As was her EKG. We will have you take a muscle relaxant and acetaminophen to help with the complaints that you have. Please follow-up with your primary physician in 24-48 hours. Please return if you have any worsening chest pain or other concerns.  You can consider using hot compresses or icing the area to help with your symptoms.

## 2016-12-28 DIAGNOSIS — M545 Low back pain: Secondary | ICD-10-CM | POA: Diagnosis not present

## 2016-12-28 DIAGNOSIS — J189 Pneumonia, unspecified organism: Secondary | ICD-10-CM | POA: Diagnosis not present

## 2016-12-28 DIAGNOSIS — R05 Cough: Secondary | ICD-10-CM | POA: Diagnosis not present

## 2016-12-28 DIAGNOSIS — N644 Mastodynia: Secondary | ICD-10-CM | POA: Diagnosis not present

## 2016-12-28 DIAGNOSIS — R0789 Other chest pain: Secondary | ICD-10-CM | POA: Diagnosis not present

## 2016-12-29 ENCOUNTER — Encounter: Payer: Self-pay | Admitting: Family Medicine

## 2017-01-01 DIAGNOSIS — M3502 Sicca syndrome with lung involvement: Secondary | ICD-10-CM | POA: Diagnosis not present

## 2017-01-01 DIAGNOSIS — J849 Interstitial pulmonary disease, unspecified: Secondary | ICD-10-CM | POA: Diagnosis not present

## 2017-01-01 DIAGNOSIS — R05 Cough: Secondary | ICD-10-CM | POA: Diagnosis not present

## 2017-01-19 ENCOUNTER — Other Ambulatory Visit: Payer: Self-pay | Admitting: Rheumatology

## 2017-01-19 ENCOUNTER — Other Ambulatory Visit: Payer: Self-pay | Admitting: Family Medicine

## 2017-01-19 DIAGNOSIS — G43811 Other migraine, intractable, with status migrainosus: Secondary | ICD-10-CM

## 2017-01-22 NOTE — Telephone Encounter (Signed)
Last Visit: 10/24/16 Patient has been referred to [facility] rheumatology and has an appointment 01/28/17.  Okay to refill Flexeril?

## 2017-01-22 NOTE — Telephone Encounter (Signed)
ok 

## 2017-02-04 ENCOUNTER — Other Ambulatory Visit: Payer: Self-pay | Admitting: *Deleted

## 2017-02-04 MED ORDER — ONDANSETRON 4 MG PO TBDP: 4.0000 mg | ORAL_TABLET | Freq: Three times a day (TID) | ORAL | 1 refills | Status: DC | PRN

## 2017-02-21 ENCOUNTER — Encounter: Payer: Self-pay | Admitting: Family Medicine

## 2017-03-19 ENCOUNTER — Encounter: Payer: Self-pay | Admitting: Family Medicine

## 2017-04-30 ENCOUNTER — Other Ambulatory Visit: Payer: Self-pay | Admitting: Family Medicine

## 2017-05-08 DIAGNOSIS — R0602 Shortness of breath: Secondary | ICD-10-CM | POA: Diagnosis not present

## 2017-05-08 DIAGNOSIS — R05 Cough: Secondary | ICD-10-CM | POA: Diagnosis not present

## 2017-05-08 DIAGNOSIS — M3500 Sicca syndrome, unspecified: Secondary | ICD-10-CM | POA: Diagnosis not present

## 2017-05-14 DIAGNOSIS — R05 Cough: Secondary | ICD-10-CM | POA: Diagnosis not present

## 2017-05-14 DIAGNOSIS — J849 Interstitial pulmonary disease, unspecified: Secondary | ICD-10-CM | POA: Diagnosis not present

## 2017-05-14 DIAGNOSIS — M3500 Sicca syndrome, unspecified: Secondary | ICD-10-CM | POA: Diagnosis not present

## 2017-05-14 DIAGNOSIS — R918 Other nonspecific abnormal finding of lung field: Secondary | ICD-10-CM | POA: Diagnosis not present

## 2017-05-16 ENCOUNTER — Other Ambulatory Visit: Payer: Self-pay | Admitting: *Deleted

## 2017-05-16 MED ORDER — LEVOTHYROXINE SODIUM 112 MCG PO TABS: ORAL_TABLET | ORAL | 0 refills | Status: DC

## 2017-05-20 DIAGNOSIS — D4989 Neoplasm of unspecified behavior of other specified sites: Secondary | ICD-10-CM | POA: Insufficient documentation

## 2017-05-28 DIAGNOSIS — D4989 Neoplasm of unspecified behavior of other specified sites: Secondary | ICD-10-CM | POA: Diagnosis not present

## 2017-06-05 ENCOUNTER — Ambulatory Visit: Payer: 59 | Admitting: Family Medicine

## 2017-06-19 ENCOUNTER — Encounter: Payer: Self-pay | Admitting: Family Medicine

## 2017-06-19 ENCOUNTER — Ambulatory Visit (INDEPENDENT_AMBULATORY_CARE_PROVIDER_SITE_OTHER): Payer: 59 | Admitting: Family Medicine

## 2017-06-19 DIAGNOSIS — E049 Nontoxic goiter, unspecified: Secondary | ICD-10-CM | POA: Diagnosis not present

## 2017-06-19 DIAGNOSIS — R6889 Other general symptoms and signs: Secondary | ICD-10-CM | POA: Diagnosis not present

## 2017-06-19 DIAGNOSIS — G43811 Other migraine, intractable, with status migrainosus: Secondary | ICD-10-CM

## 2017-06-19 DIAGNOSIS — E039 Hypothyroidism, unspecified: Secondary | ICD-10-CM | POA: Diagnosis not present

## 2017-06-19 DIAGNOSIS — R0989 Other specified symptoms and signs involving the circulatory and respiratory systems: Secondary | ICD-10-CM

## 2017-06-19 MED ORDER — SUMATRIPTAN SUCCINATE 100 MG PO TABS: ORAL_TABLET | ORAL | 1 refills | Status: DC

## 2017-06-19 NOTE — Progress Notes (Signed)
Subjective  Patient is presenting with the following illnesses  THYROID ABNORMALITY On recent PET scan for mediastinal mass (benign by report) found "Diffusely increased FDG activity in the bilateral thyroid gland may be secondary to thyroiditis; recommend clinical correlation and consider  thyroid function tests and thyroid ultrasound, if indicated." Patient has no weight loss or heat intolerance or heart racing.  Takes daily thyroid replacement.  Has had several [place] in the past   DYSPHAGIA, HOARSENESS Has been going on for months to years but is worsening especially the throat clearing and food sticking when swallowing.  She is worried the thyroid is the cause.    Chief Complaint noted Review of Symptoms - see HPI PMH - Smoking status noted.     Objective Vital Signs reviewed Heart - Regular rate and rhythm.  No murmurs, gallops or rubs.    Lungs:  Normal respiratory effort, chest expands symmetrically. Lungs are clear to auscultation, no crackles or wheezes. Neck - mild moderate thyroid fullness Mouth - no lesions, mucous membranes are moist, no decaying teeth   Frequently has dry cough with throat clearing.   Assessments/Plans  Goiter Recent PET scan suggested thyroiditis.  Clinically does not have this.  Will check labs.   Throat clearing Patient feels this is worsening and is concerned is related to her thyroid.  Will check [place] and labs

## 2017-06-19 NOTE — Patient Instructions (Signed)
Good to see you today!  Thanks for coming in.  I will contact you about your thyroid labs and review all your test and come up with some suggestions  Consider counseling - I can set you Integrated [facility]

## 2017-06-20 LAB — T3, FREE: T3, Free: 2.9 pg/mL (ref 2.0–4.4)

## 2017-06-20 LAB — T4, FREE: Free T4: 0.83 ng/dL (ref 0.82–1.77)

## 2017-06-20 LAB — TSH: TSH: 8.82 u[IU]/mL — AB (ref 0.450–4.500)

## 2017-06-20 NOTE — Assessment & Plan Note (Signed)
Recent PET scan suggested thyroiditis.  Clinically does not have this.  Will check labs.

## 2017-06-20 NOTE — Assessment & Plan Note (Signed)
Patient feels this is worsening and is concerned is related to her thyroid.  Will check [place] and labs

## 2017-06-21 ENCOUNTER — Telehealth: Payer: Self-pay | Admitting: Family Medicine

## 2017-06-21 DIAGNOSIS — E049 Nontoxic goiter, unspecified: Secondary | ICD-10-CM

## 2017-06-21 NOTE — Telephone Encounter (Signed)
Spoke with her  Will proceed with thyroid US  I ordered this.  Please schedule if needed  Thanks  [place]

## 2017-06-25 ENCOUNTER — Encounter: Payer: Self-pay | Admitting: Family Medicine

## 2017-06-27 NOTE — Telephone Encounter (Signed)
Pt is calling about her ultrasaound on her thyroid.  Has it been scheduled? Please call her at work [phone] and a message can be left.

## 2017-06-27 NOTE — Telephone Encounter (Signed)
I ordered a neck ultrasound.  Do we need to do anything to schedule it?  I thought I had notified someone on Red team but might not have?  Thanks  Gina Wise

## 2017-06-28 NOTE — Telephone Encounter (Signed)
Ultrasound order corrected in epic and scheduled for 9/13 at 1:30pm, patient informed via [place].

## 2017-06-28 NOTE — Addendum Note (Signed)
Addended by: Levert Feinstein F on: 06/28/2017 08:49 AM   Modules accepted: Orders

## 2017-07-04 ENCOUNTER — Ambulatory Visit (HOSPITAL_COMMUNITY)
Admission: RE | Admit: 2017-07-04 | Discharge: 2017-07-04 | Disposition: A | Discharge status: Home / Self Care | Payer: 59 | Source: Ambulatory Visit | Attending: Family Medicine | Admitting: Family Medicine

## 2017-07-04 DIAGNOSIS — E049 Nontoxic goiter, unspecified: Secondary | ICD-10-CM | POA: Diagnosis not present

## 2017-07-04 DIAGNOSIS — E041 Nontoxic single thyroid nodule: Secondary | ICD-10-CM | POA: Diagnosis not present

## 2017-07-08 ENCOUNTER — Telehealth: Payer: Self-pay | Admitting: *Deleted

## 2017-07-08 ENCOUNTER — Encounter: Payer: Self-pay | Admitting: Family Medicine

## 2017-07-08 NOTE — Telephone Encounter (Signed)
Patient called requesting ultrasound results.  Please give her a call. You can leave a message on her work line.  Numbers are correct.  Derl Barrow, RN

## 2017-07-09 NOTE — Telephone Encounter (Signed)
I relayed the findings in the 07/04/17 US thyroid report of "0.8 cm right inferior TR 4 nodule does not meet criteria for biopsy or any follow up."   She remains concerned about cause of pulmonary and swallowing symptoms, including possible contribution from cervical spine findings.

## 2017-07-31 ENCOUNTER — Ambulatory Visit: Payer: 59 | Admitting: Family Medicine

## 2017-08-07 ENCOUNTER — Ambulatory Visit (INDEPENDENT_AMBULATORY_CARE_PROVIDER_SITE_OTHER): Payer: 59 | Admitting: Family Medicine

## 2017-08-07 ENCOUNTER — Encounter: Payer: Self-pay | Admitting: Family Medicine

## 2017-08-07 VITALS — BP 126/78 | HR 88 | Temp 98.0°F | Wt 195.4 lb

## 2017-08-07 DIAGNOSIS — R6889 Other general symptoms and signs: Secondary | ICD-10-CM

## 2017-08-07 DIAGNOSIS — E039 Hypothyroidism, unspecified: Secondary | ICD-10-CM

## 2017-08-07 DIAGNOSIS — R0989 Other specified symptoms and signs involving the circulatory and respiratory systems: Secondary | ICD-10-CM

## 2017-08-07 DIAGNOSIS — Z23 Encounter for immunization: Secondary | ICD-10-CM | POA: Diagnosis not present

## 2017-08-07 DIAGNOSIS — Z1231 Encounter for screening mammogram for malignant neoplasm of breast: Secondary | ICD-10-CM | POA: Diagnosis not present

## 2017-08-07 DIAGNOSIS — Z1239 Encounter for other screening for malignant neoplasm of breast: Secondary | ICD-10-CM

## 2017-08-07 MED ORDER — LEVOTHYROXINE SODIUM 125 MCG PO TABS: 125.0000 ug | ORAL_TABLET | Freq: Every day | ORAL | 3 refills | Status: DC

## 2017-08-07 MED ORDER — SERTRALINE HCL 100 MG PO TABS: 50.0000 mg | ORAL_TABLET | Freq: Every day | ORAL | 2 refills | Status: DC

## 2017-08-07 NOTE — Assessment & Plan Note (Signed)
TSH not at goal and T$ barely normal.  Will increase replacement and recheck in 12 weeks

## 2017-08-07 NOTE — Addendum Note (Signed)
Addended by: Talbert Cage L on: 08/07/2017 02:09 PM   Modules accepted: Orders

## 2017-08-07 NOTE — Patient Instructions (Addendum)
Good to see you today!  Thanks for coming in.  I would see ENT to look at your larnyx with a scope  See Dr Amada Kingfisher for the colitis and for perhaps a swallowing study  You need a mammogram and a Pap smear  Come back in 6 months

## 2017-08-07 NOTE — Progress Notes (Signed)
Subjective  Patient is presenting with the following illnesses  HOARSENESS SWALLOWING COUGH THROAT CLEARING This continues to get slowly worse.  Very annoying and she feels prevents her for eating well.  Has not seen GI or ENT for about a year.  No weight loss or change in shortness of breath sensation or bleeding   HYPOTHYROIDISM Disease Monitoring Weight changes: no  Skin Changes: no Palpitations: no Heat/Cold intolerance: no Duration - years  Timing - continuous  Severity - controlled  Medication Monitoring (modifying factors) Compliance:  daily   Last TSH:   Lab Results  Component Value Date   TSH 8.820 (H) 06/19/2017    Chief Complaint noted Review of Symptoms - see HPI PMH - Smoking status noted.     Objective Vital Signs reviewed Neck:  No deformities, thyromegaly, masses, or tenderness noted.   Supple with full range of motion without pain. Extremities:  No cyanosis, edema, or deformity noted with good range of motion of all major joints.   [place] of neck reviewed with patient  Assessments/Plans  Hypothyroidism TSH not at goal and T$ barely normal.  Will increase replacement and recheck in 12 weeks  Throat clearing This along with other obstructive motility symptoms seem to be worsening.  [place] of thyroid makes this unlikely as the sole cause  Recommend a follow up with GI and ENT for evaluation and if normal work up consider neurology for motility disorder.  Her weight is stable    See after visit summary for details of patient instuctions

## 2017-08-07 NOTE — Assessment & Plan Note (Signed)
This along with other obstructive motility symptoms seem to be worsening.  [place] of thyroid makes this unlikely as the sole cause  Recommend a follow up with GI and ENT for evaluation and if normal work up consider neurology for motility disorder.  Her weight is stable

## 2017-08-13 ENCOUNTER — Ambulatory Visit: Payer: 59 | Admitting: Physician Assistant

## 2017-08-21 DIAGNOSIS — M3500 Sicca syndrome, unspecified: Secondary | ICD-10-CM | POA: Diagnosis not present

## 2017-08-23 ENCOUNTER — Telehealth: Payer: Self-pay | Admitting: Internal Medicine

## 2017-08-23 NOTE — Telephone Encounter (Signed)
Left message for pt to call back.  Pts appointment moved to Monday at 3:15pm with Ellouise Newer PA. Pt aware of appt. States she is having diarrhea. Suggested pt call her PCP or go to urgent care if she feels she needs to be seen sooner than Monday.

## 2017-08-25 DIAGNOSIS — R531 Weakness: Secondary | ICD-10-CM | POA: Diagnosis not present

## 2017-08-25 DIAGNOSIS — M25531 Pain in right wrist: Secondary | ICD-10-CM | POA: Diagnosis not present

## 2017-08-25 DIAGNOSIS — M6289 Other specified disorders of muscle: Secondary | ICD-10-CM | POA: Diagnosis not present

## 2017-08-25 DIAGNOSIS — M6281 Muscle weakness (generalized): Secondary | ICD-10-CM | POA: Insufficient documentation

## 2017-08-25 DIAGNOSIS — R200 Anesthesia of skin: Secondary | ICD-10-CM | POA: Diagnosis not present

## 2017-08-25 DIAGNOSIS — R51 Headache: Secondary | ICD-10-CM | POA: Diagnosis not present

## 2017-08-25 DIAGNOSIS — E876 Hypokalemia: Secondary | ICD-10-CM | POA: Diagnosis not present

## 2017-08-25 DIAGNOSIS — R42 Dizziness and giddiness: Secondary | ICD-10-CM | POA: Diagnosis not present

## 2017-08-26 ENCOUNTER — Ambulatory Visit: Payer: 59 | Admitting: Physician Assistant

## 2017-08-26 DIAGNOSIS — R9431 Abnormal electrocardiogram [ECG] [EKG]: Secondary | ICD-10-CM | POA: Diagnosis not present

## 2017-08-26 DIAGNOSIS — R42 Dizziness and giddiness: Secondary | ICD-10-CM | POA: Diagnosis not present

## 2017-08-26 DIAGNOSIS — E876 Hypokalemia: Secondary | ICD-10-CM | POA: Insufficient documentation

## 2017-08-27 DIAGNOSIS — I10 Essential (primary) hypertension: Secondary | ICD-10-CM | POA: Diagnosis not present

## 2017-08-27 DIAGNOSIS — R42 Dizziness and giddiness: Secondary | ICD-10-CM | POA: Diagnosis not present

## 2017-08-27 DIAGNOSIS — M3500 Sicca syndrome, unspecified: Secondary | ICD-10-CM | POA: Diagnosis not present

## 2017-09-03 ENCOUNTER — Encounter: Payer: Self-pay | Admitting: Student

## 2017-09-03 ENCOUNTER — Ambulatory Visit (INDEPENDENT_AMBULATORY_CARE_PROVIDER_SITE_OTHER): Payer: 59 | Admitting: Student

## 2017-09-03 ENCOUNTER — Ambulatory Visit: Payer: 59 | Admitting: Physician Assistant

## 2017-09-03 VITALS — BP 132/84 | HR 91 | Temp 98.4°F | Ht 66.0 in | Wt 195.4 lb

## 2017-09-03 DIAGNOSIS — R197 Diarrhea, unspecified: Secondary | ICD-10-CM | POA: Diagnosis not present

## 2017-09-03 DIAGNOSIS — R42 Dizziness and giddiness: Secondary | ICD-10-CM | POA: Diagnosis not present

## 2017-09-03 DIAGNOSIS — J309 Allergic rhinitis, unspecified: Secondary | ICD-10-CM | POA: Diagnosis not present

## 2017-09-03 DIAGNOSIS — R110 Nausea: Secondary | ICD-10-CM

## 2017-09-03 MED ORDER — ONDANSETRON 4 MG PO TBDP: 4.0000 mg | ORAL_TABLET | Freq: Three times a day (TID) | ORAL | 0 refills | Status: DC | PRN

## 2017-09-03 MED ORDER — FLUTICASONE PROPIONATE 50 MCG/ACT NA SUSP: 2.0000 | Freq: Every day | NASAL | 6 refills | Status: DC

## 2017-09-03 NOTE — Patient Instructions (Signed)
It was great seeing you today! We have addressed the following issues today 1. Disequilibrium: we have sent a referral to have vestibular physical therapist. Someone will get in touch with you about this referral in the next 1-2 weeks. You may be able to go back to work with some accommodation.  2.   Diarrhea: as strongly recommend you call your gastroenterologist office to schedule a follow-up on this. We gave you a prescription for Zofran as needed for nausea. Avoid Benadryl. You may use Zyrtec instead. You may come back and see [place] if you have fever or other symptoms concerning to you. 3.   Allergic rhinitis: we sent a prescription for Flonase to your pharmacy. Recommend trying this the way we discussed in the office.  If we did any lab work today, and the results require attention, either me or my nurse will get in touch with you. If everything is normal, you will get a letter in mail and a message via . If you don't hear from [place] in two weeks, please give [place] a call. Otherwise, we look forward to seeing you again at your next visit. If you have any questions or concerns before then, please call the clinic at [phone].  Please bring all your medications to every doctors visit  Sign up for My Chart to have easy access to your labs results, and communication with your Primary care physician.    Please check-out at the front desk before leaving the clinic.    Take Care,   Dr. Cyndia Skeeters

## 2017-09-03 NOTE — Progress Notes (Signed)
Subjective:    Gina Wise is a 56 y.o. old female here for hospital follow on her balance issue  HPI Balance issue: recently hospitalized at [facility] in [facility] 11/4-11/6 for "dizziness, unsteady gait and decreased coordination". CT head, MRI brain & MRA head and neck were read as normal (no access to image). She was discharged on meclizine and home PT. However, she didn't have PT coming out her house.  Today, she says she feels like falling forward. Denies spinning sensation or vertigo. She denies fall. 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 She says meclizine helps. Reports headache. Headache is frontal bilaterally. She describes the headache as tightness and achy. She admits nausea when she tries to eat but denies emesis. Denies vision change, photophobia, phonophobia, focal weakness, numbness or tingling. Able to drink and keep fluid down. She also reports history of allergy with nose getting crusty, dry and ithcy. She has been using benadryl for this. She also reports sick contact; son with common cold for over two weeks now.   Patient requests to go back to work. 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 Office located on  third floor. She says work is stressful. She says she has been there for 8 years.   "Colitis"; was diagnosed with sigmoid colitis on CT abdomen about three weeks ago. She reports intermittent watery diarrhea with mucus and blood every one to two weeks. She also reports urgency. Symptoms worse with green vegetables. She denies fever or abdominal pain at present but admits pain whenever the flare returns. She had normal colonoscopy except for some diverticulosis about three years ago. She also has had upper EGD that showed non-obstructing Schatzki ring, small hiatal hernia and bleeding erosive gastropathy about a year ago. Patient had an appointment with GI but ended up in hospital for her balance issue about a week ago. She says she now has  to wait until mid-December to see them again.     PMH/Problem List: has Hypothyroidism; MAJOR DPRSV DISORDER RECURRENT EPISODE MODERATE; Migraine; Essential hypertension, benign; Overweight; Dyslipidemia; Insomnia; Dysfunctional uterine bleeding; Iron deficiency; Chronic cough; Goiter; Unspecified constipation; GERD (gastroesophageal reflux disease); Schatzki's ring; Gastroparesis; Throat clearing; Hyperlipidemia; Allergic conjunctivitis; Lung disease with systemic lupus erythematosus ([place]); Sjogren's disease ([place]); Hair loss; Interstitial lung disease due to connective tissue disease ([place]); Upper airway cough syndrome; Lentigo; Lupus (systemic lupus erythematosus) ([place]); Allergic rhinitis; Acute gastric erosion; and Disequilibrium on their problem list.   has a past medical history of Allergic urticaria (02/16/2015), Allergy, Anemia, Bruising (05/10/2015), CAP (community acquired pneumonia) (08/03/2015), Chronic bronchitis ([place]), Depression, Diverticulosis, Epistaxis (12/01/2015), Gastritis, GERD (gastroesophageal reflux disease), Grover's disease, Hematemesis with nausea (08/01/2016), High cholesterol, Hypertension, Hypothyroid, Migraines, Multiple environmental allergies, Overweight(278.02), Peri-menopause, Schatzki's ring, Shingles (02/16/2015), Sjogren's disease ([place]), and Systemic lupus ([place]).  FH:  Family History  Problem Relation Age of Onset  . Hypertension Father   . Emphysema Father   . Vision loss Father   . Alcohol abuse Father   . Hypertension Mother   . Heart disease Mother   . Hepatitis Mother        Hep C  . Endometriosis Mother   . Lupus Sister   . Endometriosis Sister   . Breast cancer Maternal Aunt        30's  . Breast cancer Paternal Aunt        62's  . Lung cancer Paternal Uncle        smoker  .  Colon cancer Maternal Grandfather   . Uterine cancer Maternal Aunt        50's  . Cervical cancer Paternal Aunt        40's  . Lung cancer Paternal Uncle   . Stomach  cancer Neg Hx     SH Social History   Tobacco Use  . Smoking status: Never Smoker  . Smokeless tobacco: Never Used  Substance Use Topics  . Alcohol use: Yes    Alcohol/week: 0.0 oz    Comment: occ  . Drug use: No    Review of Systems Review of systems negative except for pertinent positives and negatives in history of present illness above.     Objective:     Vitals:   09/03/17 1001  BP: 132/84  Pulse: 91  Temp: 98.4 F (36.9 C)  TempSrc: Oral  SpO2: 97%  Weight: 195 lb 6.4 oz (88.6 kg)  Height: 5\' 6"  (1.676 m)   Body mass index is 31.54 kg/m.  Physical Exam GENERAL: appears well, no ditress EYES: PERRL, EOMI,  THROAT: MMM NECK: supple LUNGS:  No IWOB, CTAB HEART:  RRR, normal S1&S2, no murmurs ABD: soft,  with active BS, no tenderness to palpation in 4 quadrants,, no palpable mass EXT: no edema NEURO: Cranial nerves II-XII intact, motor 5/5 in all muscle groups of UE and LE bilaterally, normal tone, light sensation intact in all dermatomes of upper and lower ext bilaterally,  biceps, patellar reflexes 2+ bilaterally, no nystagmus, finger to nose intact, negative Romberg, no pronator drift.  PSYCH: flat affect    Assessment and Plan:  1. Disequilibrium:  Unclear etiology. Wonder if this is vestibular etiology given history of tinnitus. She had extensive work up with CT head, MRI and MRA which were negative for intracranial. Neuro exam is within normal limit. She has no constitutional symptoms for infectious etiology or malignancy. I believe she would benefit from vestibular PT and balance evaluation. I recommended against using Benadryl. Advised to cut down on her meclizine as well. Will manage her allergic rhinitis as below.  Gave her clearance to go back to work with accomodation (sitting) until she is evaluated by PT.   2. Diarrhea, unspecified type: was diagnosed with sigmoid colitis about about three weeks ago based on her CT abdomen. However, patient without  tenderness to palpation to think of inflammatory process at present. She is well hydrated. Recent Hgb within normal limit. I suspect IBS given her psych history but suggested calling her GI office for follow up as soon as possible. I don't see the need for urgent referral at this point.   3. Nausea without vomiting: recommended anticholinergic medications such as benadryl and meclizine if possible. Gave Rx for Zofran ODT. Normal QTc on EKG.  4. Allergic rhinitis, unspecified seasonality, unspecified trigger: gave Rx for Flonase. Discussed about proper way of using. Can try Zyrtec instead of Benadryl if needed.   Return if symptoms worsen or fail to improve.  Mercy Riding, MD 09/03/17 Pager: [phone]

## 2017-09-11 ENCOUNTER — Telehealth: Payer: Self-pay | Admitting: Family Medicine

## 2017-09-11 NOTE — Telephone Encounter (Signed)
Pt called and said she faxed over forms on 11/14 that needed to be filled out for short disability. The form has to be filled out and faxed to [employer] in a certain amount of days. She has resent the forms to [place] and I have placed them in PCP box in case the previous ones have been misplaced. These needs to be filled out as soon as possible. Please advise

## 2017-09-11 NOTE — Telephone Encounter (Signed)
Left a vm to let her know    She would need to make an appointment with me to specifically discuss disability

## 2017-09-13 ENCOUNTER — Encounter: Payer: Self-pay | Admitting: Family Medicine

## 2017-09-16 NOTE — Telephone Encounter (Signed)
Pt has called again about her disabiltiy forms. The deadline is tomorrow. Dr Erin Hearing doesn't have any appts until dec 5.  Please advise

## 2017-09-16 NOTE — Telephone Encounter (Signed)
Called and spoke with Ms Vandall Completed and put in to be faxed pile FMLA form except for last page - she will try to get information about the hospitalization from [facility]   She will meet with me on Wed to discuss long term FMLA forms  ==============================================  Dr Erin Hearing, This is the email I received from [name]. Harriet    From: [name] [email or web address]>  Sent: Monday, September 16, 2017 8:35 AM To: Lowella Fairy [email or web address]> Subject: [External Email]RE: RE: FMLA-Dr. Chambliss Importance: High  *Caution - External Email* Hi Harriet,  He left me a message and said he needs to see me before he fills them out but I am out of time.  They have to be in by 5pm tomorrow.  I would like to speak to him if possible.  I can answer any questions he may have.  However, I do NOT have my cell phone.  He will need to call me at [phone].    Thank you for getting back to me. [name]   From: Lowella Fairy [[email or web address]]  Sent: Monday, September 16, 2017 8:32 AM To: [name] Subject: [External] RE: RE: FMLA-Dr. Chambliss  EXTERNAL: This email originated from outside of the organization. Do not click any links or open any attachments unless you trust the sender and know the content is safe.  I wasn't here Tuesday and Wednesday last week.  Apparently you refaxed the forms because there is another set in his box.  When I see him today, I will ask about them  From: [name] [email or web address]>  Sent: Wednesday, September 11, 2017 10:20 AM To: Lowella Fairy @[email or web address]> Subject: [External Email]RE: FMLA-Dr. Erin Hearing  *Caution - External Email* Hi Harriet,  I just called the office to follow up on the fmla paper work that Dr. Erin Hearing needs to fax back to the [facility] and it seems that no one knows about it.  I am freaking out a bit because it is time sensitive.  What  should I do next?  Thanks, [name]   From: [name]  Sent: Monday, September 09, 2017 2:03 PM To: Karlton Lemon, Harriet' Subject: RE: [External] RE: FMLA-Dr. Erin Hearing  Ok, sounds good.  Thank you.  From: Lowella Fairy [[email or web address]]  Sent: Monday, September 09, 2017 2:02 PM To: [name] Subject: [External] RE: FMLA-Dr. Erin Hearing  EXTERNAL: This email originated from outside of the organization. Do not click any links or open any attachments unless you trust the sender and know the content is safe.  The paperwork isn't in Dr Erin Hearing box. He came before lunch and got everything out of his box.  So it is in his hands now  From: [name] [email or web address]>  Sent: Monday, September 09, 2017 10:06 AM To: Lowella Fairy [email or web address]> Subject: [External Email]FMLA-Dr. Chambliss  *Caution - External Email* Hi Harriet,  Hope you are having a good day.  Is there any way that we can check on the fmla paperwork that was given to Dr. Erin Hearing?  I only have to 11/22 to get them back to the [employer] and 11/22 is a holiday.  Thanks, [name] [facility] [employer] [facility] [place]. [place], [place] [address] [phone] p

## 2017-09-17 ENCOUNTER — Ambulatory Visit (INDEPENDENT_AMBULATORY_CARE_PROVIDER_SITE_OTHER): Payer: 59 | Admitting: Physician Assistant

## 2017-09-17 ENCOUNTER — Encounter: Payer: Self-pay | Admitting: Physician Assistant

## 2017-09-17 VITALS — BP 128/92 | HR 80 | Ht 66.0 in | Wt 197.0 lb

## 2017-09-17 DIAGNOSIS — K219 Gastro-esophageal reflux disease without esophagitis: Secondary | ICD-10-CM

## 2017-09-17 DIAGNOSIS — R1310 Dysphagia, unspecified: Secondary | ICD-10-CM

## 2017-09-17 DIAGNOSIS — R194 Change in bowel habit: Secondary | ICD-10-CM

## 2017-09-17 DIAGNOSIS — R197 Diarrhea, unspecified: Secondary | ICD-10-CM | POA: Diagnosis not present

## 2017-09-17 DIAGNOSIS — R1013 Epigastric pain: Secondary | ICD-10-CM | POA: Diagnosis not present

## 2017-09-17 DIAGNOSIS — K921 Melena: Secondary | ICD-10-CM

## 2017-09-17 MED ORDER — NA SULFATE-K SULFATE-MG SULF 17.5-3.13-1.6 GM/177ML PO SOLN: 1.0000 | ORAL | 0 refills | Status: DC

## 2017-09-17 NOTE — Patient Instructions (Signed)

## 2017-09-17 NOTE — Progress Notes (Addendum)
Chief Complaint: Dysphagia, change in bowel habits, hematochezia, abdominal pain, reflux  HPI:    Gina Wise is a 56 year old Caucasian female with a past medical history as listed below including various autoimmune diseases including Sjogren's, who follows with Dr. Hilarie Fredrickson and presents to clinic today with multiple GI complaints including dysphagia, change in bowel habits, hematochezia, abdominal pain and reflux.    Please recall patient has not been seen in clinic since 12/10/14 with Dr. Hilarie Fredrickson and at that time she was slightly improved after a change from omeprazole to pantoprazole 40 mg daily and ranitidine 150 mg at night.  Her insurance would no longer allow this though.  Patient did report occasional breakthrough symptoms of reflux and occasional pill dysphagia, but this had all gotten better since her switch.    Patient was seen in the hospital in October and had 12/17 by Dr. Carlean Purl.  This revealed a nonobstructing Schatzki's ring, small hiatal hernia, bleeding erosive gastropathy and small erosions in the pyloric channel.  Patient was started on Carafate.    Prior to this patient had a colonoscopy and EGD 05/2014.  Colonoscopy showed mild diverticulosis of the right colon was otherwise normal.  Repeat was recommended in 10 years.  EGD revealed a Schatzki's ring which was dilated as well as a 2 cm hiatal hernia and mild antral gastropathy with a normal duodenum.    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    Today, the patient is tearful in clinic.  She describes that she feels as though "my Sjogren's is eating me from the inside out".  The patient describes a diagnosis of "colitis" in 2017.  She tells me she has flares of this that are set off by certain things she eats, "but I do not know what".  Patient describes that during these flares she will have  generalized severe abdominal cramping pain and diarrhea which sends her to the bathroom at least 12 times a day for at least 12 hours, this can come back if she eats the wrong thing afterwards.  She also sees blood in her stool during these flares but also on a daily basis.  This is bright red blood sometimes mixed into the stool and other times just on the toilet paper.  She also describes mucus.    Patient also describes nausea and vomiting which can last for days at a time.  She describes a visit to the ER during which she was found to be dehydrated with a low potassium, thought related to her GI losses.    Patient also describes dysphagia today noting that she has a problem where things feel like they are "getting stuck in my throat".  She does tell me she has had a decrease in her coughing and she is "not throat clearing as much", but has continued with an epigastric pain that radiates into her chest which she feels almost all the time.  Currently, she is on Nexium 40 mg twice daily.  She describes being on Protonix in the past which her insurance would not cover anymore which seemed to help.    Patient is very frustrated she tells me all the above symptoms are affecting her life and her job as she has to call out of work frequently for these events.    Patient does follow with [facility] in the rheumatology as well as  pulmonology department.  She describes recent testing which shows that "my thyroid is getting larger".  She wonders if this impedes her swallowing at all.    Patient denies fever, chills, weight loss or symptoms that awaken her at night.  Past Medical History:  Diagnosis Date  . 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 Allergy   . Anemia   . Bruising 05/10/2015  . CAP (community acquired pneumonia) 08/03/2015  . Chronic bronchitis ([place])    "get it q yr; in the spring time" (02/17/2014)  . Depression    "have taken zoloft since 1996" (02/17/2014)  . Diverticulosis   . Epistaxis 12/01/2015  .  Gastritis   . GERD (gastroesophageal reflux disease)   . Grover's disease   . Hematemesis with nausea 08/01/2016  . High cholesterol   . Hypertension   . Hypothyroid   . Migraines    "maybe a couple/yr" (02/17/2014)  . Multiple environmental allergies   . Overweight(278.02)   . Peri-menopause   . Schatzki's ring   . Shingles 02/16/2015  . Sjogren's disease ([place])   . Systemic lupus ([place])     Past Surgical History:  Procedure Laterality Date  . APPENDECTOMY  1987  . ESOPHAGOGASTRODUODENOSCOPY N/A 02/28/2014   Procedure: ESOPHAGOGASTRODUODENOSCOPY (EGD);  Surgeon: Jerene Bears, MD;  Location: [facility] ENDOSCOPY;  Service: Endoscopy;  Laterality: N/A;  . ESOPHAGOGASTRODUODENOSCOPY N/A 08/02/2016   Procedure: ESOPHAGOGASTRODUODENOSCOPY (EGD);  Surgeon: Gatha Mayer, MD;  Location: [facility] ENDOSCOPY;  Service: Endoscopy;  Laterality: N/A;  . EXPLORATORY LAPAROTOMY  1987  . FOOT SURGERY Right ~1980   "don't know what they did; had to take something out"  . INGUINAL HERNIA REPAIR Right ~ 2003  . SHOULDER OPEN ROTATOR CUFF REPAIR Left 1995  . SHOULDER SURGERY Left 1997   "cartilage"  . TONSILLECTOMY AND ADENOIDECTOMY  1976  . TUBAL LIGATION  1998   laparoscopic  . VAGINAL HYSTERECTOMY  04/08/2012   menorrhagia    Current Outpatient Medications  Medication Sig Dispense Refill  . acetaminophen (TYLENOL) 500 MG tablet Take 1 tablet (500 mg total) by mouth every 6 (six) hours as needed. 30 tablet 0  . b complex vitamins capsule Take 1 capsule by mouth daily.    . Biotin 1000 MCG tablet Take by mouth.    . cetirizine (ZYRTEC) 10 MG tablet Take 10 mg by mouth daily.    Marland Kitchen esomeprazole (NEXIUM) 40 MG capsule Take 1 capsule (40 mg total) by mouth 2 (two) times daily before a meal. 60 capsule 11  . hydrochlorothiazide (HYDRODIURIL) 25 MG tablet TAKE 1 TABLET BY MOUTH  DAILY 90 tablet 1  . hydroxychloroquine (PLAQUENIL) 200 MG tablet Take 200 mg by mouth 2 (two) times daily.     Marland Kitchen levothyroxine  (SYNTHROID, LEVOTHROID) 125 MCG tablet Take 1 tablet (125 mcg total) by mouth daily. 90 tablet 3  . sertraline (ZOLOFT) 100 MG tablet Take 0.5 tablets (50 mg total) by mouth daily. 45 tablet 2  . SUMAtriptan (IMITREX) 100 MG tablet TAKE 1 TABLET BY MOUTH  EVERY 2 HOURS AS NEEDED FOR MIGRAINE(S) AS DIRECTED 20 tablet 1  . Vitamin D, Cholecalciferol, 400 UNITS TABS Take 400 Units by mouth daily.     No current facility-administered medications for this visit.    Facility-Administered Medications Ordered in Other Visits  Medication Dose Route Frequency Provider Last Rate Last Dose  . 0.9 %  sodium chloride infusion   Intravenous Continuous [name], MD 150 mL/hr at 02/16/14 1213  Allergies as of 09/17/2017 - Review Complete 09/17/2017  Allergen Reaction Noted  . Contrast media [iodinated diagnostic agents] Hives and Itching 03/06/2016  . Other Hives and Swelling 03/06/2016  . Codeine Nausea Only   . Erythromycin Nausea And Vomiting   . Iohexol Hives 02/12/2014    Family History  Problem Relation Age of Onset  . Hypertension Father   . Emphysema Father   . Vision loss Father   . Alcohol abuse Father   . Hypertension Mother   . Heart disease Mother   . Hepatitis Mother        Hep C  . Endometriosis Mother   . Lupus Sister   . Endometriosis Sister   . Breast cancer Maternal Aunt        30's  . Breast cancer Paternal Aunt        61's  . Lung cancer Paternal Uncle        smoker  . Colon cancer Maternal Grandfather   . Uterine cancer Maternal Aunt        50's  . Cervical cancer Paternal Aunt        40's  . Lung cancer Paternal Uncle   . Stomach cancer Neg Hx     Social History   Socioeconomic History  . Marital status: Single    Spouse name: Not on file  . Number of children: Not on file  . Years of education: Not on file  . Highest education level: Not on file  Social Needs  . Financial resource strain: Not on file  . Food insecurity - worry: Not on file    . Food insecurity - inability: Not on file  . Transportation needs - medical: Not on file  . Transportation needs - non-medical: Not on file  Occupational History  . Not on file  Tobacco Use  . Smoking status: Never Smoker  . Smokeless tobacco: Never Used  Substance and Sexual Activity  . Alcohol use: Yes    Alcohol/week: 0.0 oz    Comment: occ  . Drug use: No  . Sexual activity: Not on file  Other Topics Concern  . Not on file  Social History Narrative   Works at urgent care.     Review of Systems:    Constitutional: No weight loss, fever or chills Skin: No rash Cardiovascular: No chest pain  Respiratory: No SOB  Gastrointestinal: See HPI and otherwise negative Genitourinary: No dysuria  Neurological: No headache Musculoskeletal: No new muscle or joint pain Hematologic: No bruising Psychiatric: positive for anxiety and depression    Physical Exam:  Vital signs: BP (!) 128/92   Pulse 80   Ht 5\' 6"  (1.676 m)   Wt 197 lb (89.4 kg)   LMP 04/03/2012   BMI 31.80 kg/m   Constitutional:  Tearful overweight Caucasian female appears to be in NAD, Well developed, Well nourished, alert and cooperative Head:  Normocephalic and atraumatic. Eyes:   PEERL, EOMI. No icterus. Conjunctiva pink. Ears:  Normal auditory acuity. Neck:  Supple Throat: Oral cavity and pharynx without inflammation, swelling or lesion.  Respiratory: Respirations even and unlabored. Lungs clear to auscultation bilaterally.   No wheezes, crackles, or rhonchi.  Cardiovascular: Normal S1, S2. No MRG. Regular rate and rhythm. No peripheral edema, cyanosis or pallor.  Gastrointestinal:  Soft, nondistended, moderate generalized ttp. No rebound or guarding. Normal bowel sounds. No appreciable masses or hepatomegaly. Rectal:  Not performed.  Msk:  Symmetrical without gross deformities. Without edema, no deformity or  joint abnormality.  Neurologic:  Alert and  oriented x4;  grossly normal neurologically.  Skin:    Dry and intact without significant lesions or rashes. Psychiatric: Demonstrates good judgement and reason without abnormal affect or behaviors.  MOST RECENT LABS AND IMAGING: CBC    Component Value Date/Time   WBC 9.9 12/26/2016 1744   RBC 4.75 12/26/2016 1744   HGB 14.3 12/26/2016 1744   HCT 41.0 12/26/2016 1744   PLT 298 12/26/2016 1744   MCV 86.3 12/26/2016 1744   MCH 30.1 12/26/2016 1744   MCHC 34.9 12/26/2016 1744   RDW 13.6 12/26/2016 1744   LYMPHSABS 1.4 12/26/2016 1744   MONOABS 1.2 (H) 12/26/2016 1744   EOSABS 0.3 12/26/2016 1744   BASOSABS 0.0 12/26/2016 1744    CMP     Component Value Date/Time   NA 137 12/26/2016 1744   K 3.5 12/26/2016 1744   CL 102 12/26/2016 1744   CO2 23 12/26/2016 1744   GLUCOSE 94 12/26/2016 1744   BUN 21 (H) 12/26/2016 1744   CREATININE 0.83 12/26/2016 1744   CREATININE 0.80 05/30/2015 1204   CALCIUM 9.7 12/26/2016 1744   PROT 8.4 (H) 12/26/2016 1744   ALBUMIN 4.7 12/26/2016 1744   AST 28 12/26/2016 1744   ALT 16 12/26/2016 1744   ALKPHOS 60 12/26/2016 1744   BILITOT 0.7 12/26/2016 1744   GFRNONAA >60 12/26/2016 1744   GFRAA >60 12/26/2016 1744    Assessment: 1.  Diarrhea: Patient describes 12 hours of liquid runny stool associated with hematochezia and abdominal pain set off by "food"; consider IBS most likely versus IBD versus other 2.  Hematochezia: With above, consider relation to most likely hemorrhoids versus colitis versus other 3.  Change in bowel habits: with above 4.  Dysphagia: Worsening pill dysphagia and now to food, history of Schatzki's ring and dilation in  2015; consider the same for stricture versus ring versus web versus mass versus external compression from enlarged thyroid? 5.  GERD: Uncontrolled currently on Nexium 40 mg twice a day, patient has failed PPI therapy on various products in the past, Pantoprazole helps but her insurance does not cover this 6.  Epigastric pain: With above, likely associated with  gastritis  Plan: 1.  Discussed case with Dr. Hilarie Fredrickson at time of patient's appointment.  Scheduled patient for an EGD and colonoscopy in [place].  I discussed risks, benefits, limitations and alternatives and the patient agrees to proceed. 2.  Patient may continue her Nexium 40 mg twice daily for now. 3.  Discussed with the patient that it will help [place] to get a baseline now that she is "not feeling good", and go from there as far as recommendations for medication/therapy in the future. 4.  Patient to follow in clinic with Dr. Hilarie Fredrickson per his recommendations after time of my procedures.  Ellouise Newer, PA-C [facility] 09/17/2017, 11:07 AM   Addendum: Reviewed and agree with management. Pyrtle, Lajuan Lines, MD   Cc: Lind Covert, *

## 2017-09-18 ENCOUNTER — Encounter: Payer: Self-pay | Admitting: Internal Medicine

## 2017-09-20 ENCOUNTER — Telehealth: Payer: Self-pay | Admitting: Internal Medicine

## 2017-09-20 ENCOUNTER — Encounter: Payer: Self-pay | Admitting: *Deleted

## 2017-09-20 NOTE — Telephone Encounter (Signed)
Patient states even with ins her prep is $70 and she can not afford that. Patient procedure this Monday 12.3.18. Pt wants to know if there is something cheaper.

## 2017-09-20 NOTE — Telephone Encounter (Signed)
Pt states she cannot afford $70 for her suprep- procedure Monday-  Gave Plenvu sample with new instructions- pt to pick up today, Friday at her lunch 4th floor [facility]--  Gina Wise PV

## 2017-09-23 ENCOUNTER — Ambulatory Visit (AMBULATORY_SURGERY_CENTER): Payer: 59 | Admitting: Internal Medicine

## 2017-09-23 ENCOUNTER — Encounter: Payer: Self-pay | Admitting: Internal Medicine

## 2017-09-23 ENCOUNTER — Other Ambulatory Visit: Payer: Self-pay

## 2017-09-23 VITALS — BP 135/73 | HR 91 | Temp 97.5°F | Resp 14 | Ht 66.0 in | Wt 197.0 lb

## 2017-09-23 DIAGNOSIS — K2950 Unspecified chronic gastritis without bleeding: Secondary | ICD-10-CM | POA: Diagnosis not present

## 2017-09-23 DIAGNOSIS — R1310 Dysphagia, unspecified: Secondary | ICD-10-CM

## 2017-09-23 DIAGNOSIS — K2970 Gastritis, unspecified, without bleeding: Secondary | ICD-10-CM | POA: Diagnosis not present

## 2017-09-23 DIAGNOSIS — R197 Diarrhea, unspecified: Secondary | ICD-10-CM

## 2017-09-23 DIAGNOSIS — K219 Gastro-esophageal reflux disease without esophagitis: Secondary | ICD-10-CM | POA: Diagnosis not present

## 2017-09-23 DIAGNOSIS — K2990 Gastroduodenitis, unspecified, without bleeding: Secondary | ICD-10-CM | POA: Diagnosis not present

## 2017-09-23 MED ORDER — SODIUM CHLORIDE 0.9 % IV SOLN: 500.0000 mL | INTRAVENOUS | Status: DC

## 2017-09-23 NOTE — Op Note (Signed)
[place] Patient Name: Gina Wise Procedure Date: 09/23/2017 10:08 AM MRN: [id number] Endoscopist: Jerene Bears , MD Age: 56 Referring MD:  Date of Birth: 1961-02-25 Gender: Female Account #: [id number] Procedure:                Upper GI endoscopy Indications:              Epigastric abdominal pain, Dysphagia,                            Gastro-esophageal reflux disease Medicines:                Monitored Anesthesia Care Procedure:                Pre-Anesthesia Assessment:                           - Prior to the procedure, a History and Physical                            was performed, and patient medications and                            allergies were reviewed. The patient's tolerance of                            previous anesthesia was also reviewed. The risks                            and benefits of the procedure and the sedation                            options and risks were discussed with the patient.                            All questions were answered, and informed consent                            was obtained. Prior Anticoagulants: The patient has                            taken no previous anticoagulant or antiplatelet                            agents. ASA Grade Assessment: II - A patient with                            mild systemic disease. After reviewing the risks                            and benefits, the patient was deemed in                            satisfactory condition to undergo the procedure.  After obtaining informed consent, the endoscope was                            passed under direct vision. Throughout the                            procedure, the patient's blood pressure, pulse, and                            oxygen saturations were monitored continuously. The                            Endoscope was introduced through the mouth, and                            advanced to the second part of duodenum.  The upper                            GI endoscopy was accomplished without difficulty.                            The patient tolerated the procedure fairly well. Scope In: Scope Out: Findings:                 A low-grade of narrowing Schatzki ring (acquired)                            was found at the gastroesophageal junction (at 37                            cm from the incisors). Balloon dilation was                            appropriate, but due to uncontrolled coughing                            during the examination, this was unable to be                            performed today.                           A 3 cm hiatal hernia was present.                           Patchy mild inflammation characterized by erythema                            and granularity was found in the gastric body, in                            the gastric antrum and at the pylorus. Biopsies  were taken with a cold forceps for histology and                            Helicobacter pylori testing.                           The examined duodenum was normal. Biopsies for                            histology were taken with a cold forceps for                            evaluation of celiac disease. Complications:            No immediate complications. Estimated Blood Loss:     Estimated blood loss was minimal. Impression:               - Low-grade of narrowing Schatzki ring.                           - 3 cm hiatal hernia.                           - Gastritis. Biopsied.                           - Normal examined duodenum. Biopsied. Recommendation:           - Patient has a contact number available for                            emergencies. The signs and symptoms of potential                            delayed complications were discussed with the                            patient. Return to normal activities tomorrow.                            Written discharge instructions were  provided to the                            patient.                           - Resume previous diet.                           - Continue present medications.                           - Await pathology results.                           - Repeat upper endoscopy for dilation is  appropriate if persistent dysphagia. Jerene Bears, MD 09/23/2017 10:52:17 AM This report has been signed electronically.

## 2017-09-23 NOTE — Progress Notes (Signed)
Report to PACU, RN, vss, BBS= Clear.  

## 2017-09-23 NOTE — Progress Notes (Signed)
Called to room to assist during endoscopic procedure.  Patient ID and intended procedure confirmed with present staff. Received instructions for my participation in the procedure from the performing physician.  

## 2017-09-23 NOTE — Op Note (Signed)
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 The patient's tolerance of                            previous anesthesia was also reviewed. The risks                            and benefits of the procedure and the sedation                            options and risks were discussed with the patient.                            All questions were answered, and informed consent                            was obtained. Prior Anticoagulants: The patient has                            taken no previous anticoagulant or antiplatelet                            agents. ASA Grade Assessment: II - A patient with                            mild systemic disease. After reviewing the risks  and benefits, the patient was deemed in                            satisfactory condition to undergo the procedure.                           After obtaining informed consent, the colonoscope                            was passed under direct vision. Throughout the                            procedure, the patient's blood pressure, pulse, and                            oxygen saturations were monitored  continuously. The                            Colonoscope was introduced through the anus and                            advanced to the the terminal ileum. The colonoscopy                            was performed without difficulty. The patient                            tolerated the procedure well. The quality of the                            bowel preparation was good. The terminal ileum,                            ileocecal valve, appendiceal orifice, and rectum                            were photographed. Scope In: 10:29:14 AM Scope Out: 10:44:59 AM Scope Withdrawal Time: 0 hours 10 minutes 33 seconds  Total Procedure Duration: 0 hours 15 minutes 45 seconds  Findings:                 The digital rectal exam was normal.                           The terminal ileum appeared normal.                           Multiple small-mouthed diverticula were found in                            the sigmoid colon, proximal transverse colon,                            hepatic flexure and ascending colon.  Internal hemorrhoids were found during                            retroflexion. The hemorrhoids were small.                           Normal mucosa was found in the entire colon.                            Biopsies for histology were taken with a cold                            forceps from the right colon and left colon for                            evaluation of microscopic colitis. Complications:            No immediate complications. Estimated Blood Loss:     Estimated blood loss was minimal. Impression:               - The examined portion of the ileum was normal.                           - Mild diverticulosis in the sigmoid colon, in the                            proximal transverse colon, at the hepatic flexure                            and in the ascending colon. No evidence of colitis.                           - Small internal hemorrhoids.                            - Normal mucosa in the entire examined colon.                            Biopsied. Recommendation:           - Patient has a contact number available for                            emergencies. The signs and symptoms of potential                            delayed complications were discussed with the                            patient. Return to normal activities tomorrow.                            Written discharge instructions were provided to the  patient.                           - Resume previous diet.                           - Continue present medications.                           - Await pathology results.                           - Repeat colonoscopy in 10 years for screening                            purposes. Jerene Bears, MD 09/23/2017 10:56:08 AM This report has been signed electronically.

## 2017-09-23 NOTE — Progress Notes (Signed)
Pt's states no medical or surgical changes since previsit or office visit. 

## 2017-09-23 NOTE — Patient Instructions (Signed)
YOU HAD AN ENDOSCOPIC PROCEDURE TODAY AT [facility]:   Refer to the procedure report that was given to you for any specific questions about what was found during the examination.  If the procedure report does not answer your questions, please call your gastroenterologist to clarify.  If you requested that your care partner not be given the details of your procedure findings, then the procedure report has been included in a sealed envelope for you to review at your convenience later.  YOU SHOULD EXPECT: Some feelings of bloating in the abdomen. Passage of more gas than usual.  Walking can help get rid of the air that was put into your GI tract during the procedure and reduce the bloating. If you had a lower endoscopy (such as a colonoscopy or flexible sigmoidoscopy) you may notice spotting of blood in your stool or on the toilet paper. If you underwent a bowel prep for your procedure, you may not have a normal bowel movement for a few days.  Please Note:  You might notice some irritation and congestion in your nose or some drainage.  This is from the oxygen used during your procedure.  There is no need for concern and it should clear up in a day or so.  SYMPTOMS TO REPORT IMMEDIATELY:   Following lower endoscopy (colonoscopy or flexible sigmoidoscopy):  Excessive amounts of blood in the stool  Significant tenderness or worsening of abdominal pains  Swelling of the abdomen that is new, acute  Fever of 100F or higher   Following upper endoscopy (EGD)  Vomiting of blood or coffee ground material  New chest pain or pain under the shoulder blades  Painful or persistently difficult swallowing  New shortness of breath  Fever of 100F or higher  Black, tarry-looking stools  For urgent or emergent issues, a gastroenterologist can be reached at any hour by calling [phone].   DIET:  We do recommend a small meal at first, but then you may proceed to your regular diet.  Drink  plenty of fluids but you should avoid alcoholic beverages for 24 hours.  ACTIVITY:  You should plan to take it easy for the rest of today and you should NOT DRIVE or use heavy machinery until tomorrow (because of the sedation medicines used during the test).    FOLLOW UP: Our staff will call the number listed on your records the next business day following your procedure to check on you and address any questions or concerns that you may have regarding the information given to you following your procedure. If we do not reach you, we will leave a message.  However, if you are feeling well and you are not experiencing any problems, there is no need to return our call.  We will assume that you have returned to your regular daily activities without incident.  If any biopsies were taken you will be contacted by phone or by letter within the next 1-3 weeks.  Please call us at [phone] if you have not heard about the biopsies in 3 weeks.   Await for biopsy results Gastritis (handout given) Diverticulosis (handout given) Hemorrhoids (handout given) High Fiber Diet (handout given) Repeat Colonoscopy in 10 years   SIGNATURES/CONFIDENTIALITY: You and/or your care partner have signed paperwork which will be entered into your electronic medical record.  These signatures attest to the fact that that the information above on your After Visit Summary has been reviewed and is understood.  Full responsibility of  the confidentiality of this discharge information lies with you and/or your care-partner. 

## 2017-09-24 ENCOUNTER — Telehealth: Payer: Self-pay | Admitting: *Deleted

## 2017-09-24 ENCOUNTER — Telehealth: Payer: Self-pay

## 2017-09-24 NOTE — Telephone Encounter (Signed)
Attempted to call patient at work # x 2. Voicemail message left for the patient to report to [facility] radiology at [facility]. Attempted to call home # x 2,  which is a restricted #.I have been unable toleave a message. Called emergency contact number, their voice mail has not been set up. Unable to contact the patient.Informed Dr. Hilarie Fredrickson. Stated he will ask Quintin Alto CMA to follow up in the am.

## 2017-09-24 NOTE — Telephone Encounter (Signed)
Name identifier, left a message. 

## 2017-09-24 NOTE — Telephone Encounter (Signed)
  Follow up Call-  Call back number 09/23/2017 04/28/2015  Post procedure Call Back phone  # [phone]  Permission to leave phone message Yes Yes  Some recent data might be hidden     Patient questions:  Do you have a fever, pain , or abdominal swelling? Yes.   Pain Score  7 *  Have you tolerated food without any problems? No.  Have you been able to return to your normal activities? Yes.    Do you have any questions about your discharge instructions: Diet   No. Medications  No. Follow up visit  No.  Do you have questions or concerns about your Care? No.  Actions: * If pain score is 4 or above: Physician/ provider Notified : Zenovia Jarred, MD  Called patient for 2nd attempt for follow up. Patient answered from work telephone #. Patient stating she is "not great." When questioned patient stating she is having severe chest pain( not radiating to the back), 7 out of 10. Nauseated,  yet no vomitus.Nonfeverish, and is able to swallow. Patient stating she has a Zofran prescription available. Patient plans to leave work once finished and go home to bed.  Note forwarded to Dr. Hilarie Fredrickson for instruction.

## 2017-09-24 NOTE — Telephone Encounter (Signed)
Gina Wise Please try to reach the patient again and reassess how she is feeling See my prior recommendations if her symptoms have not improved Thanks

## 2017-09-24 NOTE — Telephone Encounter (Signed)
Would have her come for PA/LAT CXR, order STAT

## 2017-09-25 ENCOUNTER — Other Ambulatory Visit: Payer: Self-pay

## 2017-09-25 ENCOUNTER — Ambulatory Visit (INDEPENDENT_AMBULATORY_CARE_PROVIDER_SITE_OTHER): Payer: 59 | Admitting: Family Medicine

## 2017-09-25 ENCOUNTER — Encounter: Payer: Self-pay | Admitting: Family Medicine

## 2017-09-25 ENCOUNTER — Ambulatory Visit
Admission: RE | Admit: 2017-09-25 | Discharge: 2017-09-25 | Disposition: A | Discharge status: Home / Self Care | Payer: 59 | Source: Ambulatory Visit | Attending: Family Medicine | Admitting: Family Medicine

## 2017-09-25 ENCOUNTER — Telehealth: Payer: Self-pay | Admitting: Family Medicine

## 2017-09-25 DIAGNOSIS — M359 Systemic involvement of connective tissue, unspecified: Secondary | ICD-10-CM

## 2017-09-25 DIAGNOSIS — R058 Other specified cough: Secondary | ICD-10-CM

## 2017-09-25 DIAGNOSIS — M3213 Lung involvement in systemic lupus erythematosus: Secondary | ICD-10-CM

## 2017-09-25 DIAGNOSIS — M3501 Sicca syndrome with keratoconjunctivitis: Secondary | ICD-10-CM

## 2017-09-25 DIAGNOSIS — J8489 Other specified interstitial pulmonary diseases: Secondary | ICD-10-CM

## 2017-09-25 DIAGNOSIS — M358 Other specified systemic involvement of connective tissue: Secondary | ICD-10-CM | POA: Diagnosis not present

## 2017-09-25 DIAGNOSIS — R05 Cough: Secondary | ICD-10-CM

## 2017-09-25 MED ORDER — BENZONATATE 100 MG PO CAPS: 100.0000 mg | ORAL_CAPSULE | Freq: Two times a day (BID) | ORAL | 0 refills | Status: DC | PRN

## 2017-09-25 NOTE — Progress Notes (Signed)
Subjective  Patient is presenting with the following illnesses  COUGH PRODUCTIVE On Monday 2 days ago had upper and lower endoscopy.  Started with increase in her chronic cough and now productive.  Feels tired.  No fever no significant shortness of breath no edema.  Mentions has frequent episodes of pneumonia    Chief Complaint noted Review of Symptoms - see HPI PMH - Smoking status noted.     Objective Vital Signs reviewed Pulse Ox 99% on RA Frequent cough NAD Lungs - breath sounds slightly decreased at bases bilaterally without focal rales/crackles or wheezing  Extremities:  No cyanosis, edema, or deformity noted with good range of motion of all major joints.    Skin:  Intact without suspicious lesions or rashes     Assessments/Plans  No problem-specific Assessment & Plan notes found for this encounter.   See after visit summary for details of patient instuctions

## 2017-09-25 NOTE — Assessment & Plan Note (Addendum)
New onset of productive cough in a patient with chronic cough and some degree of interstitial lung disease. Symptoms began after endoscopy  Currently very good saturations, afebrile with nonspecific lung exam.  Presentation most consistent with a viral bronchitis however given complex history will get a CXR that she relates she can get immediately   Cxr is normal.  I reviewed her pulmonologist notes from [place] and her most recent CT here - does not seem to have severe lung disease.   Do not think treating her with antibiotics is worth the risk

## 2017-09-25 NOTE — Telephone Encounter (Signed)
Discussed normal chest xray Since no signs of bacterial infection will treat as viral bronchitis I will send in Tessalon  If worsening, not better in 7 days, fever then return  She agrees

## 2017-09-25 NOTE — Telephone Encounter (Signed)
Tried to reach patient at home number provided and got message that this was a "restricted number" and was unable to accept calls. I also attempted to reach patient at work number and got only a Advertising account executive. I left message asking patient to call our office back if she is still having symptoms like she was yesterday.

## 2017-09-25 NOTE — Patient Instructions (Addendum)
Good to see you today!  Thanks for coming in.  Get your chest xray and if it shows an pneumonia I will call in antibiotics   If you develop shortness of breath or lots of vomiting  Take mucinex every 6 hours  Use a humidifier in your room where you sleep  Robitussin dexamethoraphan for cough

## 2017-09-26 ENCOUNTER — Telehealth: Payer: Self-pay | Admitting: Internal Medicine

## 2017-09-26 ENCOUNTER — Encounter: Payer: Self-pay | Admitting: Internal Medicine

## 2017-09-26 DIAGNOSIS — J209 Acute bronchitis, unspecified: Secondary | ICD-10-CM | POA: Diagnosis not present

## 2017-09-26 NOTE — Telephone Encounter (Signed)
Dr. Pyrtle notified, see note below. 

## 2017-10-03 ENCOUNTER — Encounter: Payer: Self-pay | Admitting: Family Medicine

## 2017-10-09 ENCOUNTER — Telehealth: Payer: Self-pay

## 2017-10-09 NOTE — Telephone Encounter (Signed)
Please advise 

## 2017-10-09 NOTE — Telephone Encounter (Signed)
Copied from [place]. Topic: Appointment Scheduling - Scheduling Inquiry for Clinic >> Oct 09, 2017 11:07 AM Synthia Innocent wrote: Reason for CRM: would like to re-establish with Dr Larose Kells, last seen 43yrs ago. Has [facility]

## 2017-10-09 NOTE — Telephone Encounter (Signed)
Can you let Pt know below. Maybe another provider in the office can accept her.

## 2017-10-09 NOTE — Telephone Encounter (Signed)
Currently unable to accept new patients

## 2017-10-10 NOTE — Telephone Encounter (Signed)
LVM to let patient know that Gina Wise is not accepting new patients at this time however there are other providers in the office she can establish care with. Advised patient to call the office. I am not sure how to add this to the existing CRM.

## 2017-11-05 DIAGNOSIS — J209 Acute bronchitis, unspecified: Secondary | ICD-10-CM | POA: Diagnosis not present

## 2017-11-25 ENCOUNTER — Encounter: Payer: Self-pay | Admitting: *Deleted

## 2017-12-02 ENCOUNTER — Ambulatory Visit (INDEPENDENT_AMBULATORY_CARE_PROVIDER_SITE_OTHER): Payer: 59 | Admitting: Internal Medicine

## 2017-12-02 ENCOUNTER — Telehealth: Payer: Self-pay | Admitting: *Deleted

## 2017-12-02 ENCOUNTER — Encounter: Payer: Self-pay | Admitting: Internal Medicine

## 2017-12-02 VITALS — BP 124/82 | HR 67 | Ht 66.0 in | Wt 196.8 lb

## 2017-12-02 DIAGNOSIS — K222 Esophageal obstruction: Secondary | ICD-10-CM

## 2017-12-02 DIAGNOSIS — K219 Gastro-esophageal reflux disease without esophagitis: Secondary | ICD-10-CM | POA: Diagnosis not present

## 2017-12-02 DIAGNOSIS — R1310 Dysphagia, unspecified: Secondary | ICD-10-CM | POA: Diagnosis not present

## 2017-12-02 DIAGNOSIS — R197 Diarrhea, unspecified: Secondary | ICD-10-CM | POA: Diagnosis not present

## 2017-12-02 MED ORDER — DIPHENOXYLATE-ATROPINE 2.5-0.025 MG PO TABS: 1.0000 | ORAL_TABLET | Freq: Three times a day (TID) | ORAL | 1 refills | Status: DC | PRN

## 2017-12-02 MED ORDER — ESOMEPRAZOLE MAGNESIUM 40 MG PO CPDR: 40.0000 mg | DELAYED_RELEASE_CAPSULE | Freq: Two times a day (BID) | ORAL | 3 refills | Status: DC

## 2017-12-02 MED ORDER — DICYCLOMINE HCL 20 MG PO TABS: 20.0000 mg | ORAL_TABLET | Freq: Three times a day (TID) | ORAL | 2 refills | Status: DC | PRN

## 2017-12-02 NOTE — Addendum Note (Signed)
Addended by: Larina Bras on: 12/02/2017 03:52 PM   Modules accepted: Orders

## 2017-12-02 NOTE — Patient Instructions (Addendum)
You have been scheduled for an endoscopy. Please follow written instructions given to you at your visit today. If you use inhalers (even only as needed), please bring them with you on the day of your procedure. Your physician has requested that you go to www.startemmi.com and enter the access code given to you at your visit today. This web site gives a general overview about your procedure. However, you should still follow specific instructions given to you by our office regarding your preparation for the procedure.  We have sent the following medications to your pharmacy for you to pick up at your convenience: Nexium 40 mg twice daily Bentyl 20 mg three times daily as needed  Lomotil   You have been scheduled for a Barium Esophogram at [facility] (1st floor of the hospital) on Thursday 12/12/17 at 9:30 am. Please arrive 15 minutes prior to your appointment for registration. Make certain not to have anything to eat or drink 3 hours prior to your test. If you need to reschedule for any reason, please contact radiology at [phone] to do so. __________________________________________________________________ A barium swallow is an examination that concentrates on views of the esophagus. This tends to be a double contrast exam (barium and two liquids which, when combined, create a gas to distend the wall of the oesophagus) or single contrast (non-ionic iodine based). The study is usually tailored to your symptoms so a good history is essential. Attention is paid during the study to the form, structure and configuration of the esophagus, looking for functional disorders (such as aspiration, dysphagia, achalasia, motility and reflux) EXAMINATION You may be asked to change into a gown, depending on the type of swallow being performed. A radiologist and radiographer will perform the procedure. The radiologist will advise you of the type of contrast selected for your procedure and direct you during  the exam. You will be asked to stand, sit or lie in several different positions and to hold a small amount of fluid in your mouth before being asked to swallow while the imaging is performed .In some instances you may be asked to swallow barium coated marshmallows to assess the motility of a solid food bolus. The exam can be recorded as a digital or video fluoroscopy procedure. POST PROCEDURE It will take 1-2 days for the barium to pass through your system. To facilitate this, it is important, unless otherwise directed, to increase your fluids for the next 24-48hrs and to resume your normal diet.  This test typically takes about 30 minutes to perform. _____________________________________________________________________  If you are age 54 or older, your body mass index should be between 23-30. Your Body mass index is 31.76 kg/m. If this is out of the aforementioned range listed, please consider follow up with your Primary Care Provider.  If you are age 89 or younger, your body mass index should be between 19-25. Your Body mass index is 31.76 kg/m. If this is out of the aformentioned range listed, please consider follow up with your Primary Care Provider.

## 2017-12-02 NOTE — Telephone Encounter (Signed)
[facility] has called to state that they do not fill narcotics for longer than 5 day period for patients as this is a new policy. Lomotil is considered a narcotic. I have attempted to contact patient on home phone to see if she would like me to send rx elsewhere but number is restricted. I did leave a voicemail on her work number to call us back.

## 2017-12-02 NOTE — Telephone Encounter (Signed)
Patient would like rx sent to [facility] on [address]. Rx sent.

## 2017-12-02 NOTE — Progress Notes (Signed)
Subjective:    Patient ID: Gina Wise, female    DOB: July 31, 1961, 56 y.o.   MRN: [id number]  HPI Gina Wise is a 57 year old female with a history of long-standing GERD, hiatal hernia with Schatzki's ring, dysphagia, colonic diverticulosis and intermittent diarrhea who is seen for follow-up.  She also has a history of Sjogren's syndrome, chronic bronchitis with a question of pulmonary fibrosis, hypothyroidism, anxiety and depression.  She was last seen in the office in November 2018 and for upper endoscopy and colonoscopy on 09/23/2017.  EGD revealed a low-grade narrowing Schatzki's ring, however balloon dilation could not be performed due to coughing during the procedure.  A 3 cm hiatal hernia.  Patchy gastritis which was biopsied and a normal examined duodenum.  Small bowel biopsies were normal.  Stomach biopsies showed chronic inactive gastritis with no H. pylori, dysplasia or malignancy.  Colonoscopy to the cecum with a good preparation showed a normal terminal ileum and multiple diverticuli from the ascending to sigmoid colon.  Internal hemorrhoids were found and were small.  Random biopsies were normal from the right and left colon.  She reports that she continues to have issues with bronchitis and reports being treated for pneumonia 2 times since being seen here last.  She completed antibiotics on both occasions.  Today she feels that her breathing is doing overall well for her.  She has continued Nexium 40 mg twice daily.  She is still having solid food and pill dysphagia.  At times it feels like pills stop it the sternal notch.  Also intermittently with eating solid she will feel food stick under her sternum.  This is uncomfortable.  She will often get up and walk around to help food move into her stomach.  Nausea has not been much of an issue nor has vomiting.  She does continue to have intermittent episodes of diarrhea.  She estimates this happens about 2 days/month and when it does she can  have some lower abdominal crampy discomfort but significant watery diarrhea.  She thinks this is also worse when she eats foods like salads.  It also seems to be worse when she takes Plaquenil with food.  She tries to take this on an empty stomach.  She is seeing Dr. Remer Macho in rheumatology at [facility] and [name] with pulmonology at [facility].   Review of Systems As per HPI, otherwise negative  Current Medications, Allergies, Past Medical History, Past Surgical History, Family History and Social History were reviewed in [employer] record.     Objective:   Physical Exam BP 124/82   Pulse 67   Ht 5\' 6"  (1.676 m)   Wt 196 lb 12.8 oz (89.3 kg)   LMP 04/03/2012   BMI 31.76 kg/m  Constitutional: Well-developed and well-nourished. No distress. HEENT: Normocephalic and atraumatic. Conjunctivae are normal.  No scleral icterus. Neck: Neck supple. Trachea midline. Cardiovascular: Normal rate, regular rhythm and intact distal pulses. No M/R/G Pulmonary/chest: Effort normal and breath sounds normal. No wheezing, rales or rhonchi. Abdominal: Soft, nontender, nondistended. Bowel sounds active throughout. Extremities: no clubbing, cyanosis, or edema Neurological: Alert and oriented to person place and time. Skin: Skin is warm and dry. Psychiatric: Normal mood and affect. Behavior is normal.  CBC    Component Value Date/Time   WBC 9.9 12/26/2016 1744   RBC 4.75 12/26/2016 1744   HGB 14.3 12/26/2016 1744   HCT 41.0 12/26/2016 1744   PLT 298 12/26/2016 1744   MCV 86.3 12/26/2016  1744   MCH 30.1 12/26/2016 1744   MCHC 34.9 12/26/2016 1744   RDW 13.6 12/26/2016 1744   LYMPHSABS 1.4 12/26/2016 1744   MONOABS 1.2 (H) 12/26/2016 1744   EOSABS 0.3 12/26/2016 1744   BASOSABS 0.0 12/26/2016 1744   CMP     Component Value Date/Time   NA 137 12/26/2016 1744   K 3.5 12/26/2016 1744   CL 102 12/26/2016 1744   CO2 23 12/26/2016 1744   GLUCOSE 94 12/26/2016 1744   BUN 21  (H) 12/26/2016 1744   CREATININE 0.83 12/26/2016 1744   CREATININE 0.80 05/30/2015 1204   CALCIUM 9.7 12/26/2016 1744   PROT 8.4 (H) 12/26/2016 1744   ALBUMIN 4.7 12/26/2016 1744   AST 28 12/26/2016 1744   ALT 16 12/26/2016 1744   ALKPHOS 60 12/26/2016 1744   BILITOT 0.7 12/26/2016 1744   GFRNONAA >60 12/26/2016 1744   GFRAA >60 12/26/2016 1744      Assessment & Plan:  57 year old female with a history of long-standing GERD, hiatal hernia with Schatzki's ring, dysphagia, colonic diverticulosis and intermittent diarrhea who is seen for follow-up.  1. GERD/hiatal hernia/Schatzki's ring with dysphagia --unfortunately esophageal dilation was not able to be performed at the endoscopy in December due to coughing and airway reactivity on that day.  She would likely benefit from esophageal dilation and we will plan repeat EGD with the understanding that from a pulmonary standpoint she needs to be well at the time of her EGD.  We will schedule upper endoscopy but she will cancel this if she is having any pulmonary symptoms the week before and leading up to EGD.  We discussed the risk, benefits and alternatives and she is agreeable and wishes to proceed.  I am also going to order a barium esophagram with tablet to evaluate esophageal motility in light of her Sjogren's syndrome and other rheumatologic disorder.  Rule out esophageal dysmotility  Continue Nexium 40 mg twice daily before meals to control reflux  2.  Intermittent diarrhea --possibly secondary to Plaquenil.  Also likely a component of irritable bowel.  She will continue dietary modification.  I am going to give her Bentyl 20 mg to use 3 times daily as needed for lower abdominal crampy discomfort.  Also Lomotil 1 tablet 3 times daily as needed on the days in which she is having diarrhea.  Most recently it is fairly infrequent and so hopefully intermittent Lomotil can be beneficial for her.  If it becomes more frequent we could consider  rifaximin for IBS-D.  25 minutes spent with the patient today. Greater than 50% was spent in counseling and coordination of care with the patient

## 2017-12-11 ENCOUNTER — Ambulatory Visit: Payer: 59 | Admitting: Family Medicine

## 2017-12-11 ENCOUNTER — Encounter: Payer: Self-pay | Admitting: Family Medicine

## 2017-12-11 VITALS — BP 138/88 | HR 96 | Temp 97.8°F | Ht 65.75 in | Wt 194.8 lb

## 2017-12-11 DIAGNOSIS — E785 Hyperlipidemia, unspecified: Secondary | ICD-10-CM

## 2017-12-11 DIAGNOSIS — K222 Esophageal obstruction: Secondary | ICD-10-CM

## 2017-12-11 DIAGNOSIS — M358 Other specified systemic involvement of connective tissue: Secondary | ICD-10-CM

## 2017-12-11 DIAGNOSIS — N8189 Other female genital prolapse: Secondary | ICD-10-CM

## 2017-12-11 DIAGNOSIS — G43909 Migraine, unspecified, not intractable, without status migrainosus: Secondary | ICD-10-CM | POA: Diagnosis not present

## 2017-12-11 DIAGNOSIS — I10 Essential (primary) hypertension: Secondary | ICD-10-CM

## 2017-12-11 DIAGNOSIS — M359 Systemic involvement of connective tissue, unspecified: Secondary | ICD-10-CM

## 2017-12-11 DIAGNOSIS — M3213 Lung involvement in systemic lupus erythematosus: Secondary | ICD-10-CM

## 2017-12-11 DIAGNOSIS — G43811 Other migraine, intractable, with status migrainosus: Secondary | ICD-10-CM | POA: Diagnosis not present

## 2017-12-11 DIAGNOSIS — J8489 Other specified interstitial pulmonary diseases: Secondary | ICD-10-CM

## 2017-12-11 DIAGNOSIS — M3501 Sicca syndrome with keratoconjunctivitis: Secondary | ICD-10-CM | POA: Diagnosis not present

## 2017-12-11 DIAGNOSIS — F331 Major depressive disorder, recurrent, moderate: Secondary | ICD-10-CM | POA: Diagnosis not present

## 2017-12-11 DIAGNOSIS — E039 Hypothyroidism, unspecified: Secondary | ICD-10-CM | POA: Diagnosis not present

## 2017-12-11 MED ORDER — KETOROLAC TROMETHAMINE 60 MG/2ML IM SOLN
60.0000 mg | Freq: Once | INTRAMUSCULAR | Status: AC
  Administered 2017-12-11: 60 mg via INTRAMUSCULAR

## 2017-12-11 MED ORDER — SUMATRIPTAN SUCCINATE 100 MG PO TABS
ORAL_TABLET | ORAL | 1 refills | Status: DC
Start: 2017-12-11 — End: 2019-05-11

## 2017-12-11 NOTE — Assessment & Plan Note (Signed)
Labs today

## 2017-12-11 NOTE — Addendum Note (Signed)
Addended by: Lucille Passy on: 12/11/2017 09:04 AM   Modules accepted: Level of Service

## 2017-12-11 NOTE — Assessment & Plan Note (Signed)
Due for labs today. 

## 2017-12-11 NOTE — Progress Notes (Signed)
Subjective:   Patient ID: Gina Wise, female    DOB: 1961/09/07, 57 y.o.   MRN: [id number]  Gina Wise is a pleasant 57 y.o. year old female who presents to clinic today with New Patient (Initial Visit) (Patient is here today to [place] care. She was a pt at [facility] for 8 years and every 4 years they changed providers. All labs must go to "[facility]." So much progress has been made in the last 24 months and she doesn't want to backslide and she states "I actually feel pretty good for the first time in a long time and I want it to stay that way."); Migraine (Patient is C/O a Migraine that started yesterday.  She normally takes an Imitrex at onset but she was out.  States that this happens with barometric pressure changes. Starting with light sensitivity and works at a computer.  She is nauseated.  Location is left front section if you were to look at the top of her head and split in 4 equal parts.); and Protruding Cystocele (She is also requesting a referral to a "Female" GYN.  She was being seen by Dr. Wayne Both who did a partial Hysterectomy (still has cervix & ovaries).  She has been having problems with the cystocele which is now starting to protrude.)  on 12/11/2017  HPI:  Has been followed at [place] for 8 years.  Here to establish care.  Currently has a migraine- history of migraines.  Takes Imitrex for abortive therapy.  Often associated with sensitivity to light and nausea.  She is currently nauseated.  Currently headache is left frontal.  Hypothyroidism- clinically euthyroid with synthroid 125 mcg daily. Lab Results  Component Value Date   TSH 8.820 (H) 06/19/2017   Denies symptoms of hypo or hyperthyroidism.  Depression- fees well controlled with current dose of zoloft.  HTN- On HCTZ 25 mg daily. Lab Results  Component Value Date   CREATININE 0.83 12/26/2016   Lupus/Sjorgrens/ILD- followed by rheum at [facility]. On plaquenil.  Also followed by Dr. Hilarie Fredrickson for  recurrent colitis dysphagia.  Swallow study scheduled with him tomorrow.  Wants referral to a female GYN.  Has been seeing Dr. Phineas Real- s/p partial hysterectomy.  Now she feels she has a cystocele that is protruding and wants to consider repair. Current Outpatient Medications on File Prior to Visit  Medication Sig Dispense Refill  . acetaminophen (TYLENOL) 500 MG tablet Take 1 tablet (500 mg total) by mouth every 6 (six) hours as needed. 30 tablet 0  . b complex vitamins capsule Take 1 capsule by mouth daily.    . Biotin 1000 MCG tablet Take by mouth.    . cetirizine (ZYRTEC) 10 MG tablet Take 10 mg by mouth daily.    Marland Kitchen dicyclomine (BENTYL) 20 MG tablet Take 1 tablet (20 mg total) by mouth 3 (three) times daily as needed for spasms. 60 tablet 2  . diphenoxylate-atropine (LOMOTIL) 2.5-0.025 MG tablet Take 1 tablet by mouth 3 (three) times daily as needed for diarrhea or loose stools. 60 tablet 1  . esomeprazole (NEXIUM) 40 MG capsule Take 1 capsule (40 mg total) by mouth 2 (two) times daily. (Patient taking differently: Take 40 mg by mouth 2 (two) times daily. DAW) 60 capsule 3  . hydrochlorothiazide (HYDRODIURIL) 25 MG tablet TAKE 1 TABLET BY MOUTH  DAILY 90 tablet 1  . hydroxychloroquine (PLAQUENIL) 200 MG tablet Take 200 mg by mouth 2 (two) times daily.     Marland Kitchen levothyroxine (  SYNTHROID, LEVOTHROID) 125 MCG tablet Take 1 tablet (125 mcg total) by mouth daily. 90 tablet 3  . sertraline (ZOLOFT) 100 MG tablet Take 0.5 tablets (50 mg total) by mouth daily. 45 tablet 2  . SUMAtriptan (IMITREX) 100 MG tablet TAKE 1 TABLET BY MOUTH  EVERY 2 HOURS AS NEEDED FOR MIGRAINE(S) AS DIRECTED 20 tablet 1  . Vitamin D, Cholecalciferol, 400 UNITS TABS Take 400 Units by mouth daily.     No current facility-administered medications on file prior to visit.     Allergies  Allergen Reactions  . Contrast Media [Iodinated Diagnostic Agents] Hives and Itching  . Other Hives and Swelling    CATS AND CAT DANDER  .  Codeine Nausea Only  . Erythromycin Nausea And Vomiting    Severe abdominal pain  . Iohexol Hives    Past Medical History:  Diagnosis Date  . 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 Allergy   . Anemia   . Bruising 05/10/2015  . CAP (community acquired pneumonia) 08/03/2015  . Chronic bronchitis ([place])    "get it q yr; in the spring time" (02/17/2014)  . Depression    "have taken zoloft since 1996" (02/17/2014)  . Diverticulosis   . Epistaxis 12/01/2015  . Gastritis   . GERD (gastroesophageal reflux disease)   . Grover's disease   . Hematemesis with nausea 08/01/2016  . Hiatal hernia   . High cholesterol   . Hypertension   . Hypothyroid   . Internal hemorrhoids   . Migraines    "maybe a couple/yr" (02/17/2014)  . Multiple environmental allergies   . Overweight(278.02)   . Peri-menopause   . Schatzki's ring   . Shingles 02/16/2015  . Sjogren's disease ([place])   . Systemic lupus ([place])     Past Surgical History:  Procedure Laterality Date  . APPENDECTOMY  1987  . ESOPHAGOGASTRODUODENOSCOPY N/A 02/28/2014   Procedure: ESOPHAGOGASTRODUODENOSCOPY (EGD);  Surgeon: Jerene Bears, MD;  Location[facility] END[facility]COPY;  Service: Endoscopy;  Laterality: N/A;  . ESOPHAGOGASTRODUODENOSCOPY N/A 08/02/2016   Procedure: ESOPHAGOGASTRODUODENOSCOPY (EGD);  Surgeon: Gatha Mayer, MD;  Location: [facility] ENDOSCOPY;  Service: Endoscopy;  Laterality: N/A;  . EXPLORATORY LAPAROTOMY  1987  . FOOT SURGERY Right ~1980   "don't know what they did; had to take something out"  . INGUINAL HERNIA REPAIR Right ~ 2003  . SHOULDER OPEN ROTATOR CUFF REPAIR Left 1995  . SHOULDER SURGERY Left 1997   "cartilage"  . TONSILLECTOMY AND ADENOIDECTOMY  1976  . TUBAL LIGATION  1998   laparoscopic  . VAGINAL HYSTERECTOMY  04/08/2012   menorrhagia    Family History  Problem Relation Age of Onset  . Hypertension Father   . Emphysema Father   . Vision loss Father   . Alcohol abuse Father   . Hypertension Mother   . Heart disease  Mother   . Hepatitis Mother        Hep C  . Endometriosis Mother   . Pancreatic disease Mother   . Lupus Sister   . Endometriosis Sister   . Breast cancer Maternal Aunt        30's  . Breast cancer Paternal Aunt        15's  . Lung cancer Paternal Uncle        smoker  . Colon cancer Maternal Grandfather   . Uterine cancer Maternal Aunt        50's  . Cervical cancer Paternal Aunt        40's  .  Lung cancer Paternal Uncle   . Stomach cancer Neg Hx   . Pancreatic cancer Neg Hx     Social History   Socioeconomic History  . Marital status: Single    Spouse name: Not on file  . Number of children: Not on file  . Years of education: Not on file  . Highest education level: Not on file  Social Needs  . Financial resource strain: Not on file  . Food insecurity - worry: Not on file  . Food insecurity - inability: Not on file  . Transportation needs - medical: Not on file  . Transportation needs - non-medical: Not on file  Occupational History  . Not on file  Tobacco Use  . Smoking status: Never Smoker  . Smokeless tobacco: Never Used  Substance and Sexual Activity  . Alcohol use: Yes    Alcohol/week: 0.0 oz    Comment: occ  . Drug use: No  . Sexual activity: Not on file  Other Topics Concern  . Not on file  Social History Narrative   Works at urgent care.    The PMH, PSH, Social History, Family History, Medications, and allergies have been reviewed in [facility], and have been updated if relevant.   Review of Systems  Constitutional: Negative.   HENT: Negative.   Eyes: Positive for photophobia.  Respiratory: Negative.   Cardiovascular: Negative.   Gastrointestinal: Positive for nausea. Negative for abdominal distention, abdominal pain, anal bleeding, blood in stool, constipation and diarrhea.  Endocrine: Negative.   Genitourinary: Positive for pelvic pain.  Musculoskeletal: Negative.   Allergic/Immunologic: Negative.   Neurological: Positive for headaches. Negative  for dizziness, tremors, seizures, syncope, facial asymmetry, speech difficulty, weakness, light-headedness and numbness.  Hematological: Negative.   Psychiatric/Behavioral: Negative.   All other systems reviewed and are negative.      Objective:    BP 138/88 (BP Location: Left Arm, Patient Position: Sitting, Cuff Size: Normal)   Pulse 96   Temp 97.8 F (36.6 C) (Oral)   Ht 5' 5.75" (1.67 m)   Wt 194 lb 12.8 oz (88.4 kg)   LMP 04/03/2012 Comment: Still has Cervix and Ovaries  SpO2 97%   BMI 31.68 kg/m    Physical Exam  Constitutional: She is oriented to person, place, and time. She appears well-developed and well-nourished. No distress.  HENT:  Head: Normocephalic and atraumatic.  Eyes: Conjunctivae are normal.  Cardiovascular: Normal rate and regular rhythm.  Pulmonary/Chest: Effort normal and breath sounds normal.  Musculoskeletal: Normal range of motion.  Neurological: She is alert and oriented to person, place, and time.  Skin: Skin is warm and dry. She is not diaphoretic.  Psychiatric: She has a normal mood and affect. Her behavior is normal. Judgment and thought content normal.  Nursing note and vitals reviewed.         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

## 2017-12-11 NOTE — Assessment & Plan Note (Signed)
IM toradol given in office today, imitrex refilled.

## 2017-12-11 NOTE — Assessment & Plan Note (Signed)
Followed by rheum. 

## 2017-12-11 NOTE — Patient Instructions (Signed)
Great to meet you.   I will call you with your lab results from today and you can view them online.   Brayton Layman will call you with your gynecology referral.

## 2017-12-11 NOTE — Assessment & Plan Note (Signed)
Swallow study scheduled for tomorrow.

## 2017-12-11 NOTE — Assessment & Plan Note (Signed)
Normotensive. No changes made today.

## 2017-12-11 NOTE — Assessment & Plan Note (Signed)
Referral placed to GYN 

## 2017-12-12 ENCOUNTER — Ambulatory Visit (HOSPITAL_COMMUNITY)
Admission: RE | Admit: 2017-12-12 | Discharge: 2017-12-12 | Disposition: A | Discharge status: Home / Self Care | Payer: 59 | Source: Ambulatory Visit | Attending: Internal Medicine | Admitting: Internal Medicine

## 2017-12-12 DIAGNOSIS — K222 Esophageal obstruction: Secondary | ICD-10-CM | POA: Insufficient documentation

## 2017-12-12 DIAGNOSIS — K219 Gastro-esophageal reflux disease without esophagitis: Secondary | ICD-10-CM | POA: Diagnosis present

## 2017-12-12 DIAGNOSIS — K449 Diaphragmatic hernia without obstruction or gangrene: Secondary | ICD-10-CM | POA: Diagnosis not present

## 2017-12-12 DIAGNOSIS — R1310 Dysphagia, unspecified: Secondary | ICD-10-CM | POA: Diagnosis not present

## 2017-12-13 LAB — COMPREHENSIVE METABOLIC PANEL
ALK PHOS: 78 IU/L (ref 39–117)
ALT: 10 IU/L (ref 0–32)
AST: 15 IU/L (ref 0–40)
Albumin/Globulin Ratio: 1.6 (ref 1.2–2.2)
Albumin: 4.6 g/dL (ref 3.5–5.5)
BILIRUBIN TOTAL: 0.4 mg/dL (ref 0.0–1.2)
BUN/Creatinine Ratio: 16 (ref 9–23)
BUN: 13 mg/dL (ref 6–24)
CO2: 21 mmol/L (ref 20–29)
Calcium: 9.7 mg/dL (ref 8.7–10.2)
Chloride: 104 mmol/L (ref 96–106)
Creatinine, Ser: 0.8 mg/dL (ref 0.57–1.00)
GFR calc non Af Amer: 83 mL/min/{1.73_m2} (ref >59)
GFR, EST AFRICAN AMERICAN: 96 mL/min/{1.73_m2} (ref >59)
GLUCOSE: 93 mg/dL (ref 65–99)
Globulin, Total: 2.8 g/dL (ref 1.5–4.5)
Potassium: 3.7 mmol/L (ref 3.5–5.2)
Sodium: 142 mmol/L (ref 134–144)
TOTAL PROTEIN: 7.4 g/dL (ref 6.0–8.5)

## 2017-12-13 LAB — T4, FREE: FREE T4: 1.09 ng/dL (ref 0.82–1.77)

## 2017-12-13 LAB — LIPID PANEL
CHOLESTEROL TOTAL: 238 mg/dL — AB (ref 100–199)
Chol/HDL Ratio: 4.9 ratio — ABNORMAL HIGH (ref 0.0–4.4)
HDL: 49 mg/dL (ref >39)
LDL Calculated: 167 mg/dL — ABNORMAL HIGH (ref 0–99)
TRIGLYCERIDES: 112 mg/dL (ref 0–149)
VLDL CHOLESTEROL CAL: 22 mg/dL (ref 5–40)

## 2017-12-13 LAB — TSH: TSH: 3.81 u[IU]/mL (ref 0.450–4.500)

## 2017-12-14 ENCOUNTER — Encounter: Payer: Self-pay | Admitting: Family Medicine

## 2017-12-26 ENCOUNTER — Ambulatory Visit: Payer: 59 | Admitting: Nurse Practitioner

## 2017-12-26 ENCOUNTER — Encounter: Payer: Self-pay | Admitting: Nurse Practitioner

## 2017-12-26 VITALS — BP 126/90 | HR 99 | Temp 98.1°F | Ht 65.75 in | Wt 195.0 lb

## 2017-12-26 DIAGNOSIS — J101 Influenza due to other identified influenza virus with other respiratory manifestations: Secondary | ICD-10-CM

## 2017-12-26 LAB — POCT INFLUENZA A/B
Influenza A, POC: POSITIVE — AB
Influenza B, POC: NEGATIVE

## 2017-12-26 MED ORDER — OSELTAMIVIR PHOSPHATE 75 MG PO CAPS: 75.0000 mg | ORAL_CAPSULE | Freq: Two times a day (BID) | ORAL | 0 refills | Status: DC

## 2017-12-26 MED ORDER — ALBUTEROL SULFATE HFA 108 (90 BASE) MCG/ACT IN AERS: 1.0000 | INHALATION_SPRAY | Freq: Four times a day (QID) | RESPIRATORY_TRACT | 0 refills | Status: DC | PRN

## 2017-12-26 MED ORDER — GUAIFENESIN ER 600 MG PO TB12: 600.0000 mg | ORAL_TABLET | Freq: Two times a day (BID) | ORAL | 0 refills | Status: DC | PRN

## 2017-12-26 MED ORDER — HYDROCODONE-HOMATROPINE 5-1.5 MG/5ML PO SYRP: 5.0000 mL | ORAL_SOLUTION | Freq: Three times a day (TID) | ORAL | 0 refills | Status: DC | PRN

## 2017-12-26 MED ORDER — IPRATROPIUM BROMIDE 0.03 % NA SOLN: 2.0000 | Freq: Two times a day (BID) | NASAL | 0 refills | Status: DC

## 2017-12-26 NOTE — Progress Notes (Signed)
Subjective:  Patient ID: Gina Wise, female    DOB: May 29, 1961  Age: 57 y.o. MRN: [id number]  CC: Cough (coughing,sore throat,congestion,SOB,bodyache/2 days./co-worker dx with flu)   Cough  This is a new problem. The current episode started in the past 7 days. The problem has been unchanged. The cough is productive of sputum. Associated symptoms include chills, a fever, headaches, nasal congestion, postnasal drip, rhinorrhea, a sore throat and shortness of breath. Pertinent negatives include no chest pain, heartburn, hemoptysis, myalgias, rash, weight loss or wheezing. The symptoms are aggravated by lying down and cold air. She has tried OTC cough suppressant for the symptoms. The treatment provided no relief. Her past medical history is significant for bronchitis.    Outpatient Medications Prior to Visit  Medication Sig Dispense Refill  . acetaminophen (TYLENOL) 500 MG tablet Take 1 tablet (500 mg total) by mouth every 6 (six) hours as needed. 30 tablet 0  . b complex vitamins capsule Take 1 capsule by mouth daily.    . Biotin 1000 MCG tablet Take by mouth.    . cetirizine (ZYRTEC) 10 MG tablet Take 10 mg by mouth daily.    Marland Kitchen dicyclomine (BENTYL) 20 MG tablet Take 1 tablet (20 mg total) by mouth 3 (three) times daily as needed for spasms. 60 tablet 2  . diphenoxylate-atropine (LOMOTIL) 2.5-0.025 MG tablet Take 1 tablet by mouth 3 (three) times daily as needed for diarrhea or loose stools. 60 tablet 1  . esomeprazole (NEXIUM) 40 MG capsule Take 1 capsule (40 mg total) by mouth 2 (two) times daily. (Patient taking differently: Take 40 mg by mouth 2 (two) times daily. DAW) 60 capsule 3  . hydrochlorothiazide (HYDRODIURIL) 25 MG tablet TAKE 1 TABLET BY MOUTH  DAILY 90 tablet 1  . hydroxychloroquine (PLAQUENIL) 200 MG tablet Take 200 mg by mouth 2 (two) times daily.     Marland Kitchen levothyroxine (SYNTHROID, LEVOTHROID) 125 MCG tablet Take 1 tablet (125 mcg total) by mouth daily. 90 tablet 3  . sertraline  (ZOLOFT) 100 MG tablet Take 0.5 tablets (50 mg total) by mouth daily. 45 tablet 2  . SUMAtriptan (IMITREX) 100 MG tablet TAKE 1 TABLET BY MOUTH  EVERY 2 HOURS AS NEEDED FOR MIGRAINE(S) AS DIRECTED 20 tablet 1  . Vitamin D, Cholecalciferol, 400 UNITS TABS Take 400 Units by mouth daily.     No facility-administered medications prior to visit.     ROS See HPI  Objective:  BP 126/90   Pulse 99   Temp 98.1 F (36.7 C)   Ht 5' 5.75" (1.67 m)   Wt 195 lb (88.5 kg)   LMP 04/03/2012 Comment: Still has Cervix and Ovaries  SpO2 98%   BMI 31.71 kg/m   BP Readings from Last 3 Encounters:  12/26/17 126/90  12/11/17 138/88  12/02/17 124/82    Wt Readings from Last 3 Encounters:  12/26/17 195 lb (88.5 kg)  12/11/17 194 lb 12.8 oz (88.4 kg)  12/02/17 196 lb 12.8 oz (89.3 kg)    Physical Exam  Constitutional: She is oriented to person, place, and time. No distress.  HENT:  Right Ear: Tympanic membrane, external ear and ear canal normal.  Left Ear: Tympanic membrane, external ear and ear canal normal.  Nose: Mucosal edema and rhinorrhea present. Right sinus exhibits maxillary sinus tenderness. Right sinus exhibits no frontal sinus tenderness. Left sinus exhibits maxillary sinus tenderness. Left sinus exhibits no frontal sinus tenderness.  Mouth/Throat: Uvula is midline. No trismus in the jaw. Posterior oropharyngeal  erythema present. No oropharyngeal exudate.  Eyes: No scleral icterus.  Neck: Normal range of motion. Neck supple.  Cardiovascular: Normal rate and normal heart sounds.  Pulmonary/Chest: Breath sounds normal. No respiratory distress. She has no wheezes. She has no rales.  Musculoskeletal: She exhibits no edema.  Lymphadenopathy:    She has cervical adenopathy.  Neurological: She is alert and oriented to person, place, and time.  Vitals reviewed.   Lab Results  Component Value Date   WBC 9.9 12/26/2016   HGB 14.3 12/26/2016   HCT 41.0 12/26/2016   PLT 298 12/26/2016     GLUCOSE 93 12/11/2017   CHOL 238 (H) 12/11/2017   TRIG 112 12/11/2017   HDL 49 12/11/2017   LDLDIRECT 144 (H) 09/03/2011   LDLCALC 167 (H) 12/11/2017   ALT 10 12/11/2017   AST 15 12/11/2017   NA 142 12/11/2017   K 3.7 12/11/2017   CL 104 12/11/2017   CREATININE 0.80 12/11/2017   BUN 13 12/11/2017   CO2 21 12/11/2017   TSH 3.810 12/11/2017   INR 1.05 02/17/2014   HGBA1C 5.4 08/10/2014    Dg Esophagus  Result Date: 12/12/2017 CLINICAL DATA:  Dysphagia. Patient feels food getting stuck in her throat, as well as the distal esophagus. EXAM: ESOPHOGRAM / BARIUM SWALLOW / BARIUM TABLET STUDY TECHNIQUE: Combined double contrast and single contrast examination performed using effervescent crystals, thick barium liquid, and thin barium liquid. The patient was observed with fluoroscopy swallowing a 13 mm barium sulphate tablet. FLUOROSCOPY TIME:  Fluoroscopy Time:  2 minutes, 12 seconds. Radiation Exposure Index (if provided by the fluoroscopic device): 20.8 mGy. Number of Acquired Spot Images: 0 COMPARISON:  CT chest dated December 28, 2016. FINDINGS: The pharyngeal phase of swallowing was normal. There is mild mass effect on the posterior cervical esophagus due to anterior cervical spine osteophytes. Primary peristaltic waves in the esophagus were normal. No obstruction to the forward flow of contrast throughout the esophagus and into the stomach. Normal esophageal course and contour. Normal esophageal mucosal pattern. There is a Schatzki's ring in the distal esophagus near the GE junction. The narrowing measures approximately 12 mm. No esophageal stricture or ulceration. Small hiatal hernia. No gastroesophageal reflux occurred spontaneously or was elicited. Slightly delayed passage of a 13 mm barium tablet into the stomach. The tablet initially got stuck at the Schatzki's ring, but then passed into the stomach after 1 minute and several additional swallows of water. IMPRESSION: 1. Schatzki's ring in the  distal esophagus near the GE junction with associated small hiatal hernia. Slightly delayed passage of the barium tablet into the stomach due to the Schatzki's ring. Electronically Signed   By: Titus Dubin M.D.   On: 12/12/2017 10:33    Assessment & Plan:   Lekesha was seen today for cough.  Diagnoses and all orders for this visit:  Influenza A -     POCT Influenza A/B -     oseltamivir (TAMIFLU) 75 MG capsule; Take 1 capsule (75 mg total) by mouth 2 (two) times daily. -     HYDROcodone-homatropine (HYCODAN) 5-1.5 MG/5ML syrup; Take 5 mLs by mouth every 8 (eight) hours as needed for cough. -     albuterol (PROVENTIL HFA;VENTOLIN HFA) 108 (90 Base) MCG/ACT inhaler; Inhale 1-2 puffs into the lungs every 6 (six) hours as needed. -     guaiFENesin (MUCINEX) 600 MG 12 hr tablet; Take 1 tablet (600 mg total) by mouth 2 (two) times daily as needed for cough or  to loosen phlegm. -     ipratropium (ATROVENT) 0.03 % nasal spray; Place 2 sprays into both nostrils 2 (two) times daily. Do not use for more than 5days.   I am having [name] start on oseltamivir, HYDROcodone-homatropine, albuterol, guaiFENesin, and ipratropium. I am also having her maintain her Vitamin D (Cholecalciferol), b complex vitamins, hydroxychloroquine, Biotin, acetaminophen, hydrochlorothiazide, cetirizine, levothyroxine, sertraline, esomeprazole, dicyclomine, diphenoxylate-atropine, and SUMAtriptan.  Meds ordered this encounter  Medications  . oseltamivir (TAMIFLU) 75 MG capsule    Sig: Take 1 capsule (75 mg total) by mouth 2 (two) times daily.    Dispense:  10 capsule    Refill:  0    Order Specific Question:   Supervising Provider    Answer:   Lucille Passy [[id number]]  . HYDROcodone-homatropine (HYCODAN) 5-1.5 MG/5ML syrup    Sig: Take 5 mLs by mouth every 8 (eight) hours as needed for cough.    Dispense:  60 mL    Refill:  0    Order Specific Question:   Supervising Provider    Answer:   Lucille Passy [[id number]]  .  albuterol (PROVENTIL HFA;VENTOLIN HFA) 108 (90 Base) MCG/ACT inhaler    Sig: Inhale 1-2 puffs into the lungs every 6 (six) hours as needed.    Dispense:  1 Inhaler    Refill:  0    Order Specific Question:   Supervising Provider    Answer:   Lucille Passy [[id number]]  . guaiFENesin (MUCINEX) 600 MG 12 hr tablet    Sig: Take 1 tablet (600 mg total) by mouth 2 (two) times daily as needed for cough or to loosen phlegm.    Dispense:  14 tablet    Refill:  0    Order Specific Question:   Supervising Provider    Answer:   Lucille Passy [[id number]]  . ipratropium (ATROVENT) 0.03 % nasal spray    Sig: Place 2 sprays into both nostrils 2 (two) times daily. Do not use for more than 5days.    Dispense:  30 mL    Refill:  0    Order Specific Question:   Supervising Provider    Answer:   Lucille Passy [[id number]]    Follow-up: No Follow-up on file.  Wilfred Lacy, NP

## 2017-12-26 NOTE — Progress Notes (Signed)
Gina Wise [id number]

## 2017-12-26 NOTE — Patient Instructions (Signed)

## 2018-01-03 ENCOUNTER — Encounter: Payer: Self-pay | Admitting: Internal Medicine

## 2018-01-09 ENCOUNTER — Ambulatory Visit: Payer: 59 | Admitting: Family Medicine

## 2018-01-09 ENCOUNTER — Encounter: Payer: Self-pay | Admitting: Family Medicine

## 2018-01-09 VITALS — BP 114/86 | HR 84 | Temp 98.5°F | Ht 65.75 in | Wt 194.8 lb

## 2018-01-09 DIAGNOSIS — Z6831 Body mass index (BMI) 31.0-31.9, adult: Secondary | ICD-10-CM | POA: Diagnosis not present

## 2018-01-09 DIAGNOSIS — E785 Hyperlipidemia, unspecified: Secondary | ICD-10-CM

## 2018-01-09 MED ORDER — ROSUVASTATIN CALCIUM 5 MG PO TABS: 5.0000 mg | ORAL_TABLET | Freq: Every day | ORAL | 3 refills | Status: DC

## 2018-01-09 MED ORDER — PHENTERMINE HCL 37.5 MG PO CAPS: 37.5000 mg | ORAL_CAPSULE | ORAL | 1 refills | Status: DC

## 2018-01-09 NOTE — Assessment & Plan Note (Signed)
Discussed weight loss plan. She is declining nutritionist at this time. Pt would also like to discuss medication options- discussed phentermine risk benefits, side effects including HTN, pulmonary HTN, stroke.    She would like to start phentermine and lifestyle changes.  Follow up in 1 month.  If BMI < 27 will decrease to half dose x 1 month then stop

## 2018-01-09 NOTE — Assessment & Plan Note (Signed)
Start crestor 5 mg daily. Follow up labs in 1 month.

## 2018-01-09 NOTE — Progress Notes (Signed)
Subjective:   Patient ID: Gina Wise, female    DOB: November 26, 1960, 57 y.o.   MRN: [id number]  Gina Wise is a pleasant 56 y.o. year old female who presents to clinic today with Weight Check (Patient states that she is hungry all the time and describes it like when she takes prednisone but is not on it for a while.  Trying to eat healthy is difficult due to the cholitis.  Fruits and vegies end up causing flare-ups.  Needs some guidance.)  on 01/09/2018  HPI:  HLD-  Elevated when we checked it last month.  She has been considering tx options and would like to start a statin.  Lab Results  Component Value Date   CHOL 238 (H) 12/11/2017   HDL 49 12/11/2017   LDLCALC 167 (H) 12/11/2017   LDLDIRECT 144 (H) 09/03/2011   TRIG 112 12/11/2017   CHOLHDL 4.9 (H) 12/11/2017   Weight loss- has been on phentermine and would like to try again.  She needs help with which foods she can eat.   Current Outpatient Medications on File Prior to Visit  Medication Sig Dispense Refill  . acetaminophen (TYLENOL) 500 MG tablet Take 1 tablet (500 mg total) by mouth every 6 (six) hours as needed. 30 tablet 0  . albuterol (PROVENTIL HFA;VENTOLIN HFA) 108 (90 Base) MCG/ACT inhaler Inhale 1-2 puffs into the lungs every 6 (six) hours as needed. 1 Inhaler 0  . b complex vitamins capsule Take 1 capsule by mouth daily.    . Biotin 1000 MCG tablet Take by mouth.    . cetirizine (ZYRTEC) 10 MG tablet Take 10 mg by mouth daily.    Marland Kitchen dicyclomine (BENTYL) 20 MG tablet Take 1 tablet (20 mg total) by mouth 3 (three) times daily as needed for spasms. 60 tablet 2  . diphenoxylate-atropine (LOMOTIL) 2.5-0.025 MG tablet Take 1 tablet by mouth 3 (three) times daily as needed for diarrhea or loose stools. 60 tablet 1  . esomeprazole (NEXIUM) 40 MG capsule Take 1 capsule (40 mg total) by mouth 2 (two) times daily. (Patient taking differently: Take 40 mg by mouth 2 (two) times daily. DAW) 60 capsule 3  . guaiFENesin (MUCINEX) 600 MG 12  hr tablet Take 1 tablet (600 mg total) by mouth 2 (two) times daily as needed for cough or to loosen phlegm. 14 tablet 0  . hydrochlorothiazide (HYDRODIURIL) 25 MG tablet TAKE 1 TABLET BY MOUTH  DAILY 90 tablet 1  . hydroxychloroquine (PLAQUENIL) 200 MG tablet Take 200 mg by mouth 2 (two) times daily.     Marland Kitchen ipratropium (ATROVENT) 0.03 % nasal spray Place 2 sprays into both nostrils 2 (two) times daily. Do not use for more than 5days. 30 mL 0  . levothyroxine (SYNTHROID, LEVOTHROID) 125 MCG tablet Take 1 tablet (125 mcg total) by mouth daily. 90 tablet 3  . sertraline (ZOLOFT) 100 MG tablet Take 0.5 tablets (50 mg total) by mouth daily. 45 tablet 2  . SUMAtriptan (IMITREX) 100 MG tablet TAKE 1 TABLET BY MOUTH  EVERY 2 HOURS AS NEEDED FOR MIGRAINE(S) AS DIRECTED 20 tablet 1  . Vitamin D, Cholecalciferol, 400 UNITS TABS Take 400 Units by mouth daily.     No current facility-administered medications on file prior to visit.     Allergies  Allergen Reactions  . Contrast Media [Iodinated Diagnostic Agents] Hives and Itching  . Other Hives and Swelling    CATS AND CAT DANDER  . Codeine Nausea Only  . Erythromycin  Nausea And Vomiting    Severe abdominal pain  . Iohexol Hives    Past Medical History:  Diagnosis Date  . 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 Allergy   . Anemia   . Bruising 05/10/2015  . CAP (community acquired pneumonia) 08/03/2015  . Chronic bronchitis ([place])    "get it q yr; in the spring time" (02/17/2014)  . Depression    "have taken zoloft since 1996" (02/17/2014)  . Diverticulosis   . Epistaxis 12/01/2015  . Gastritis   . GERD (gastroesophageal reflux disease)   . Grover's disease   . Hematemesis with nausea 08/01/2016  . Hiatal hernia   . High cholesterol   . Hypertension   . Hypothyroid   . Internal hemorrhoids   . Migraines    "maybe a couple/yr" (02/17/2014)  . Multiple environmental allergies   . Overweight(278.02)   . Peri-menopause   . Schatzki's ring   .  Shingles 02/16/2015  . Sjogren's disease ([place])   . Systemic lupus ([place])     Past Surgical History:  Procedure Laterality Date  . APPENDECTOMY  1987  . ESOPHAGOGASTRODUODENOSCOPY N/A 02/28/2014   Procedure: ESOPHAGOGASTRODUODENOSCOPY (EGD);  Surgeon: Jerene Bears, MD;  Location: [facility] ENDOSCOPY;  Service: Endoscopy;  Laterality: N/A;  . ESOPHAGOGASTRODUODENOSCOPY N/A 08/02/2016   Procedure: ESOPHAGOGASTRODUODENOSCOPY (EGD);  Surgeon: Gatha Mayer, MD;  Location: [facility] ENDOSCOPY;  Service: Endoscopy;  Laterality: N/A;  . EXPLORATORY LAPAROTOMY  1987  . FOOT SURGERY Right ~1980   "don't know what they did; had to take something out"  . INGUINAL HERNIA REPAIR Right ~ 2003  . SHOULDER OPEN ROTATOR CUFF REPAIR Left 1995  . SHOULDER SURGERY Left 1997   "cartilage"  . TONSILLECTOMY AND ADENOIDECTOMY  1976  . TUBAL LIGATION  1998   laparoscopic  . VAGINAL HYSTERECTOMY  04/08/2012   menorrhagia    Family History  Problem Relation Age of Onset  . Hypertension Father   . Vision loss Father   . Alcohol abuse Father   . Emphysema Father        Smoker  . Hearing loss Father   . Early death Father        Murdered  . Hypertension Mother   . Heart disease Mother   . Hepatitis Mother        Hep C  . Endometriosis Mother   . Pancreatic disease Mother   . Depression Mother   . Hearing loss Mother   . Lupus Sister   . Endometriosis Sister   . Arthritis Sister   . Depression Sister   . Breast cancer Paternal Aunt        41's  . Lung cancer Paternal Uncle        smoker  . Colon cancer Maternal Grandfather   . Uterine cancer Maternal Aunt        50's  . Emphysema Paternal Aunt        Non-smoker/Died of Pneumonia  . Lung cancer Paternal Uncle   . Heart attack Maternal Uncle   . Parkinson's disease Maternal Grandmother   . Alcohol abuse Maternal Grandmother   . Heart disease Paternal Grandmother        Died during angioplasty surgery  . Aneurysm Paternal Grandfather   . Allergy (severe)  Son        ASA-Idiopathic Thrombocytopenia  . Early death Maternal Uncle        Murdered  . Lung disease Paternal Aunt        Bronchiecstasis  .  Lung cancer Paternal Uncle   . Early death Paternal Uncle        Carbon Monoxide Poisoning/Accidental  . Stomach cancer Neg Hx   . Pancreatic cancer Neg Hx     Social History   Socioeconomic History  . Marital status: Single    Spouse name: Not on file  . Number of children: Not on file  . Years of education: Not on file  . Highest education level: Not on file  Occupational History  . Not on file  Social Needs  . Financial resource strain: Not on file  . Food insecurity:    Worry: Not on file    Inability: Not on file  . Transportation needs:    Medical: Not on file    Non-medical: Not on file  Tobacco Use  . Smoking status: Never Smoker  . Smokeless tobacco: Never Used  Substance and Sexual Activity  . Alcohol use: Yes    Alcohol/week: 0.0 oz    Comment: occ  . Drug use: No  . Sexual activity: Not on file  Lifestyle  . Physical activity:    Days per week: Not on file    Minutes per session: Not on file  . Stress: Not on file  Relationships  . Social connections:    Talks on phone: Not on file    Gets together: Not on file    Attends religious service: Not on file    Active member of club or organization: Not on file    Attends meetings of clubs or organizations: Not on file    Relationship status: Not on file  . Intimate partner violence:    Fear of current or ex partner: Not on file    Emotionally abused: Not on file    Physically abused: Not on file    Forced sexual activity: Not on file  Other Topics Concern  . Not on file  Social History Narrative   Works at urgent care.    The PMH, PSH, Social History, Family History, Medications, and allergies have been reviewed in [facility], and have been updated if relevant.  Review of Systems  Respiratory: Negative.   Cardiovascular: Negative for chest pain.    Genitourinary: Negative.   Musculoskeletal: Negative.   Skin: Negative.   Neurological: Negative.   Hematological: Negative.   Psychiatric/Behavioral: Negative.   All other systems reviewed and are negative.      Objective:    BP 114/86 (BP Location: Left Arm, Patient Position: Sitting, Cuff Size: Normal)   Pulse 84   Temp 98.5 F (36.9 C) (Oral)   Ht 5' 5.75" (1.67 m)   Wt 194 lb 12.8 oz (88.4 kg)   LMP 04/03/2012 Comment: Still has Cervix and Ovaries  SpO2 97%   BMI 31.68 kg/m    Physical Exam  Constitutional: She is oriented to person, place, and time. She appears well-developed and well-nourished. No distress.  HENT:  Head: Normocephalic and atraumatic.  Eyes: Conjunctivae are normal.  Cardiovascular: Normal rate.  Pulmonary/Chest: Effort normal.  Musculoskeletal: Normal range of motion.  Neurological: She is alert and oriented to person, place, and time. No cranial nerve deficit.  Skin: Skin is warm and dry.  Psychiatric: She has a normal mood and affect. Her behavior is normal. Judgment and thought content normal.  Nursing note and vitals reviewed.         Assessment & Plan:   Hyperlipidemia, unspecified hyperlipidemia type  BMI 31.0-31.9,adult No follow-ups on file.

## 2018-01-09 NOTE — Patient Instructions (Signed)
Great to see you. We are starting phentermine 37.5 mg every morning and crestor 5 mg every evening.   Come see me in 1 month.

## 2018-01-17 ENCOUNTER — Ambulatory Visit (AMBULATORY_SURGERY_CENTER): Payer: 59 | Admitting: Internal Medicine

## 2018-01-17 ENCOUNTER — Encounter: Payer: Self-pay | Admitting: Internal Medicine

## 2018-01-17 ENCOUNTER — Other Ambulatory Visit: Payer: Self-pay

## 2018-01-17 VITALS — BP 141/82 | HR 77 | Temp 98.2°F | Resp 18 | Ht 66.9 in | Wt 194.0 lb

## 2018-01-17 DIAGNOSIS — K222 Esophageal obstruction: Secondary | ICD-10-CM | POA: Diagnosis not present

## 2018-01-17 DIAGNOSIS — R1310 Dysphagia, unspecified: Secondary | ICD-10-CM

## 2018-01-17 MED ORDER — SODIUM CHLORIDE 0.9 % IV SOLN: 500.0000 mL | Freq: Once | INTRAVENOUS | Status: DC

## 2018-01-17 NOTE — Patient Instructions (Signed)
Impression/Recommendations:  Hiatal hernia handout given to patient. Dilation diet handout given to patient.  Resume previous diet. Continue present medications.  Repeat upper endoscopy as needed for retreatment.  YOU HAD AN ENDOSCOPIC PROCEDURE TODAY AT [facility]:   Refer to the procedure report that was given to you for any specific questions about what was found during the examination.  If the procedure report does not answer your questions, please call your gastroenterologist to clarify.  If you requested that your care partner not be given the details of your procedure findings, then the procedure report has been included in a sealed envelope for you to review at your convenience later.  YOU SHOULD EXPECT: Some feelings of bloating in the abdomen. Passage of more gas than usual.  Walking can help get rid of the air that was put into your GI tract during the procedure and reduce the bloating. If you had a lower endoscopy (such as a colonoscopy or flexible sigmoidoscopy) you may notice spotting of blood in your stool or on the toilet paper. If you underwent a bowel prep for your procedure, you may not have a normal bowel movement for a few days.  Please Note:  You might notice some irritation and congestion in your nose or some drainage.  This is from the oxygen used during your procedure.  There is no need for concern and it should clear up in a day or so.  SYMPTOMS TO REPORT IMMEDIATELY:   Following upper endoscopy (EGD)  Vomiting of blood or coffee ground material  New chest pain or pain under the shoulder blades  Painful or persistently difficult swallowing  New shortness of breath  Fever of 100F or higher  Black, tarry-looking stools  For urgent or emergent issues, a gastroenterologist can be reached at any hour by calling [phone].   DIET:  We do recommend a small meal at first, but then you may proceed to your regular diet.  Drink plenty of fluids but  you should avoid alcoholic beverages for 24 hours.  ACTIVITY:  You should plan to take it easy for the rest of today and you should NOT DRIVE or use heavy machinery until tomorrow (because of the sedation medicines used during the test).    FOLLOW UP: Our staff will call the number listed on your records the next business day following your procedure to check on you and address any questions or concerns that you may have regarding the information given to you following your procedure. If we do not reach you, we will leave a message.  However, if you are feeling well and you are not experiencing any problems, there is no need to return our call.  We will assume that you have returned to your regular daily activities without incident.  If any biopsies were taken you will be contacted by phone or by letter within the next 1-3 weeks.  Please call us at [phone] if you have not heard about the biopsies in 3 weeks.    SIGNATURES/CONFIDENTIALITY: You and/or your care partner have signed paperwork which will be entered into your electronic medical record.  These signatures attest to the fact that that the information above on your After Visit Summary has been reviewed and is understood.  Full responsibility of the confidentiality of this discharge information lies with you and/or your care-partner.

## 2018-01-17 NOTE — Op Note (Signed)
[place] Patient Name: Gina Wise Procedure Date: 01/17/2018 2:20 PM MRN: [id number] Endoscopist: Jerene Bears , MD Age: 57 Referring MD:  Date of Birth: [birth year] Gender: Female Account #: [id number] Procedure:                Upper GI endoscopy Indications:              Dysphagia, Abnormal cine-esophagram Medicines:                Monitored Anesthesia Care; lidocaine 100 mg Procedure:                Pre-Anesthesia Assessment:                           - Prior to the procedure, a History and Physical                            was performed, and patient medications and                            allergies were reviewed. The patient's tolerance of                            previous anesthesia was also reviewed. The risks                            and benefits of the procedure and the sedation                            options and risks were discussed with the patient.                            All questions were answered, and informed consent                            was obtained. Prior Anticoagulants: The patient has                            taken no previous anticoagulant or antiplatelet                            agents. ASA Grade Assessment: II - A patient with                            mild systemic disease. After reviewing the risks                            and benefits, the patient was deemed in                            satisfactory condition to undergo the procedure.                           After obtaining informed consent, the endoscope was  passed under direct vision. Throughout the                            procedure, the patient's blood pressure, pulse, and                            oxygen saturations were monitored continuously. The                            Model GIF-[id number]) scope was introduced                            through the mouth, and advanced to the second part                            of  duodenum. The upper GI endoscopy was                            accomplished without difficulty. The patient                            tolerated the procedure well. Scope In: Scope Out: Findings:                 A low-grade of narrowing Schatzki ring (acquired)                            was found at the gastroesophageal junction, at 37                            cm. A TTS dilator was passed through the scope.                            Dilation with a 16-17-18 mm balloon dilator was                            performed to 18 mm. After 18 mm dilation there was                            minimal change in the ring, and thus cold forceps                            were used to disrupt the ring (tissue discarded).                           A 3 cm hiatal hernia was present (37 cm to 40 cm                            from incisors).                           The entire examined stomach was normal.  The examined duodenum was normal. Complications:            No immediate complications. Estimated Blood Loss:     Estimated blood loss was minimal. Impression:               - Low-grade of narrowing Schatzki ring. Dilated to                            18 mm.                           - 3 cm hiatal hernia.                           - Normal stomach.                           - Normal examined duodenum. Recommendation:           - Patient has a contact number available for                            emergencies. The signs and symptoms of potential                            delayed complications were discussed with the                            patient. Return to normal activities tomorrow.                            Written discharge instructions were provided to the                            patient.                           - Resume previous diet.                           - Continue present medications.                           - Repeat upper endoscopy as needed for  retreatment. Jerene Bears, MD 01/17/2018 2:48:51 PM This report has been signed electronically.

## 2018-01-17 NOTE — Progress Notes (Signed)
History reviewed today 

## 2018-01-17 NOTE — Progress Notes (Signed)
A and O x3. Report to RN. Tolerated MAC anesthesia well.Teeth unchanged after procedure.

## 2018-01-17 NOTE — Progress Notes (Signed)
Called to room to assist during endoscopic procedure.  Patient ID and intended procedure confirmed with present staff. Received instructions for my participation in the procedure from the performing physician.  

## 2018-01-20 ENCOUNTER — Telehealth: Payer: Self-pay | Admitting: *Deleted

## 2018-01-20 NOTE — Telephone Encounter (Signed)
No answer, message left for the patient. 

## 2018-01-20 NOTE — Telephone Encounter (Signed)
  Follow up Call-  Call back number 01/17/2018 09/23/2017 04/28/2015  Post procedure Call Back phone  # [phone]  Permission to leave phone message Yes Yes Yes  Some recent data might be hidden     Patient questions:  Message left to call us if necessary.  Second call.

## 2018-01-22 ENCOUNTER — Ambulatory Visit (INDEPENDENT_AMBULATORY_CARE_PROVIDER_SITE_OTHER): Payer: 59 | Admitting: Obstetrics & Gynecology

## 2018-01-22 ENCOUNTER — Ambulatory Visit (HOSPITAL_BASED_OUTPATIENT_CLINIC_OR_DEPARTMENT_OTHER)
Admission: RE | Admit: 2018-01-22 | Discharge: 2018-01-22 | Disposition: A | Discharge status: Home / Self Care | Payer: 59 | Source: Ambulatory Visit | Attending: Obstetrics & Gynecology | Admitting: Obstetrics & Gynecology

## 2018-01-22 ENCOUNTER — Encounter: Payer: Self-pay | Admitting: Obstetrics & Gynecology

## 2018-01-22 ENCOUNTER — Other Ambulatory Visit: Payer: Self-pay | Admitting: Obstetrics & Gynecology

## 2018-01-22 VITALS — BP 106/69 | HR 110 | Ht 66.0 in | Wt 196.0 lb

## 2018-01-22 DIAGNOSIS — N993 Prolapse of vaginal vault after hysterectomy: Secondary | ICD-10-CM | POA: Diagnosis not present

## 2018-01-22 DIAGNOSIS — Z1239 Encounter for other screening for malignant neoplasm of breast: Secondary | ICD-10-CM

## 2018-01-22 DIAGNOSIS — Z1231 Encounter for screening mammogram for malignant neoplasm of breast: Secondary | ICD-10-CM | POA: Diagnosis not present

## 2018-01-22 DIAGNOSIS — N3946 Mixed incontinence: Secondary | ICD-10-CM

## 2018-01-22 NOTE — Patient Instructions (Signed)
Pelvic Organ Prolapse Pelvic organ prolapse is the stretching, bulging, or dropping of pelvic organs into an abnormal position. It happens when the muscles and tissues that surround and support pelvic structures are stretched or weak. Pelvic organ prolapse can involve:  Vagina (vaginal prolapse).  Uterus (uterine prolapse).  Bladder (cystocele).  Rectum (rectocele).  Intestines (enterocele).  When organs other than the vagina are involved, they often bulge into the vagina or protrude from the vagina, depending on how severe the prolapse is. What are the causes? Causes of this condition include:  Pregnancy, labor, and childbirth.  Long-lasting (chronic) cough.  Chronic constipation.  Obesity.  Past pelvic surgery.  Aging. During and after menopause, a decreased production of the hormone estrogen can weaken pelvic ligaments and muscles.  Consistently lifting more than 50 lb (23 kg).  Buildup of fluid in the abdomen due to certain diseases and other conditions.  What are the signs or symptoms? Symptoms of this condition include:  Loss of bladder control when you cough, sneeze, strain, and exercise (stress incontinence). This may be worse immediately following childbirth, and it may gradually improve over time.  Feeling pressure in your pelvis or vagina. This pressure may increase when you cough or when you are having a bowel movement.  A bulge that protrudes from the opening of your vagina or against your vaginal wall. If your uterus protrudes through the opening of your vagina and rubs against your clothing, you may also experience soreness, ulcers, infection, pain, and bleeding.  Increased effort to have a bowel movement or urinate.  Pain in your low back.  Pain, discomfort, or disinterest in sexual intercourse.  Repeated bladder infections (urinary tract infections).  Difficulty inserting or inability to insert a tampon or applicator.  In some people, this  condition does not cause any symptoms. How is this diagnosed? Your health care provider may perform an internal and external vaginal and rectal exam. During the exam, you may be asked to cough and strain while you are lying down, sitting, and standing up. Your health care provider will determine if other tests are required, such as bladder function tests. How is this treated? In most cases, this condition needs to be treated only if it produces symptoms. No treatment is guaranteed to correct the prolapse or relieve the symptoms completely. Treatment may include:  Lifestyle changes, such as: ? Avoiding drinking beverages that contain caffeine. ? Increasing your intake of high-fiber foods. This can help to decrease constipation and straining during bowel movements. ? Emptying your bladder at scheduled times (bladder training therapy). This can help to reduce or avoid urinary incontinence. ? Losing weight if you are overweight or obese.  Estrogen. Estrogen may help mild prolapse by increasing the strength and tone of pelvic floor muscles.  Kegel exercises. These may help mild cases of prolapse by strengthening and tightening the muscles of the pelvic floor.  Pessary insertion. A pessary is a soft, flexible device that is placed into your vagina by your health care provider to help support the vaginal walls and keep pelvic organs in place.  Surgery. This is often the only form of treatment for severe prolapse. Different types of surgeries are available.  Follow these instructions at home:  Wear a sanitary pad or absorbent product if you have urinary incontinence.  Avoid heavy lifting and straining with exercise and work. Do not hold your breath when you perform mild to moderate lifting and exercise activities. Limit your activities as directed by your health care   provider.  Take medicines only as directed by your health care provider.  Perform Kegel exercises as directed by your health care  provider.  If you have a pessary, take care of it as directed by your health care provider. Contact a health care provider if:  Your symptoms interfere with your daily activities or sex life.  You need medicine to help with the discomfort.  You notice bleeding from the vagina that is not related to your period.  You have a fever.  You have pain or bleeding when you urinate.  You have bleeding when you have a bowel movement.  You lose urine when you have sex.  You have chronic constipation.  You have a pessary that falls out.  You have vaginal discharge that has a bad smell.  You have low abdominal pain or cramping that is unusual for you. This information is not intended to replace advice given to you by your health care provider. Make sure you discuss any questions you have with your health care provider. Document Released: 05/05/2014 Document Revised: 03/15/2016 Document Reviewed: 12/21/2013 Elsevier Interactive Patient Education  2018 Elsevier Inc.  

## 2018-01-22 NOTE — Progress Notes (Signed)
History:  57 y.o. [id number] here today for eval of pelvic floor. Pt is s/p SVD x2 (9# and 10#)  Pt is s/p [facility] 03/2012. Pt feels like she has a ball coming out of the vagina. She leaks urine with coughing and moving. Pt does not exercise much but, she does climb stair. Pt does not leak with walking. She notes it with just standing.  Pt can start stream if she wants to. Nocturia x3. Pt drinks Coke in the am. No coffee or tea intake.   Occ ETOH intake.    Pt does have a chronic cough.  Pt reports limited caffeine intake.     Pt is not sexually active. Divorced.    The following portions of the patient's history were reviewed and updated as appropriate: allergies, current medications, past family history, past medical history, past social history, past surgical history and problem list.  Review of Systems:  Pertinent items are noted in HPI.    Objective:  Physical Exam Blood pressure 106/69, pulse (!) 110, height 5\' 6"  (1.676 m), weight 196 lb (88.9 kg), last menstrual period 04/03/2012.  CONSTITUTIONAL: Well-developed, well-nourished female in no acute distress.  HENT:  Normocephalic, atraumatic EYES: Conjunctivae and EOM are normal. No scleral icterus.  NECK: Normal range of motion SKIN: Skin is warm and dry. No rash noted. Not diaphoretic.No pallor. [place]: Alert and oriented to person, place, and time. Normal coordination.  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 This is assoc with incontinence that appears to be mixed based on her history. She has a chronic cough due to her chronic medical problems.  Her medical condition is now stable and she wants to consider surgical correction of the above. I have also discussed with her pessary placement which she has declined.     Will refer to Dr. Etta Grandchild for further eval and management  Hematuria- pt reports that she has a f/u visit with her rheumatologist for eval of this within the month.   Urine cx   Breast cancer screen  screening mammogram  Total face-to-face time with patient was 30 min.  Greater than 50% was spent in counseling and coordination of care with the patient.   Breckon Reeves L. Harraway-Smith, M.D., [place]lynn June

## 2018-01-22 NOTE — Progress Notes (Signed)
Patient had partial hysterectomy in 2013 and states she was told has a cystocele and feels like it has gotten worse.Kathrene Alu RN

## 2018-01-23 ENCOUNTER — Encounter: Payer: Self-pay | Admitting: Obstetrics & Gynecology

## 2018-01-24 ENCOUNTER — Telehealth: Payer: Self-pay

## 2018-01-24 ENCOUNTER — Other Ambulatory Visit: Payer: Self-pay | Admitting: Obstetrics & Gynecology

## 2018-01-24 DIAGNOSIS — R928 Other abnormal and inconclusive findings on diagnostic imaging of breast: Secondary | ICD-10-CM

## 2018-01-24 DIAGNOSIS — R922 Inconclusive mammogram: Secondary | ICD-10-CM

## 2018-01-24 DIAGNOSIS — N3946 Mixed incontinence: Secondary | ICD-10-CM | POA: Diagnosis not present

## 2018-01-24 NOTE — Telephone Encounter (Signed)
Left message for patient to return call to office. Kathrene Alu RNBSN  Patient returned call to office informed patient of dense breast tissue on mammorgram and that they saw a possible mass on the left breast. Informed patient she will need to go to [facility] imaging to have this done. Patient made aware that receptionist will return call to her with appointment for Dr. Zigmund Daniel and diagnostic mammogram. Kathrene Alu RN

## 2018-01-24 NOTE — Addendum Note (Signed)
Addended by: Phill Myron on: 01/24/2018 09:07 AM   Modules accepted: Orders

## 2018-01-24 NOTE — Telephone Encounter (Signed)
-----   Message from Lavonia Drafts, MD sent at 01/23/2018 10:38 PM EDT ----- Please call pt. She needs additional imaging for dense breast with questionable lesion in the left breast.  Document call in chart.  Thx, clh-S

## 2018-01-26 LAB — URINE CULTURE

## 2018-01-27 ENCOUNTER — Other Ambulatory Visit: Payer: Self-pay | Admitting: Obstetrics & Gynecology

## 2018-01-27 ENCOUNTER — Telehealth: Payer: Self-pay

## 2018-01-27 DIAGNOSIS — N390 Urinary tract infection, site not specified: Secondary | ICD-10-CM

## 2018-01-27 MED ORDER — AMOXICILLIN 500 MG PO CAPS: 500.0000 mg | ORAL_CAPSULE | Freq: Three times a day (TID) | ORAL | 2 refills | Status: DC

## 2018-01-27 NOTE — Telephone Encounter (Signed)
Patient called to give results for urine culture.  Left message for patient to return call to office. Antibiotics sent in by Dr. Ihor Dow. Kathrene Alu RN

## 2018-01-27 NOTE — Telephone Encounter (Signed)
Patient called back and made aware of UTI and need for antibiotics for treatment.  Patient made aware and states understanding. Kathrene Alu RNBSN

## 2018-01-29 ENCOUNTER — Ambulatory Visit
Admission: RE | Admit: 2018-01-29 | Discharge: 2018-01-29 | Disposition: A | Discharge status: Home / Self Care | Payer: 59 | Source: Ambulatory Visit | Attending: Obstetrics & Gynecology | Admitting: Obstetrics & Gynecology

## 2018-01-29 ENCOUNTER — Other Ambulatory Visit: Payer: Self-pay | Admitting: Obstetrics & Gynecology

## 2018-01-29 DIAGNOSIS — R928 Other abnormal and inconclusive findings on diagnostic imaging of breast: Secondary | ICD-10-CM | POA: Diagnosis not present

## 2018-01-29 DIAGNOSIS — N6489 Other specified disorders of breast: Secondary | ICD-10-CM | POA: Diagnosis not present

## 2018-01-29 DIAGNOSIS — N651 Disproportion of reconstructed breast: Secondary | ICD-10-CM | POA: Diagnosis not present

## 2018-01-30 ENCOUNTER — Ambulatory Visit
Admission: RE | Admit: 2018-01-30 | Discharge: 2018-01-30 | Disposition: A | Discharge status: Home / Self Care | Payer: 59 | Source: Ambulatory Visit | Attending: Obstetrics & Gynecology | Admitting: Obstetrics & Gynecology

## 2018-01-30 ENCOUNTER — Other Ambulatory Visit: Payer: Self-pay | Admitting: Obstetrics & Gynecology

## 2018-01-30 DIAGNOSIS — N6489 Other specified disorders of breast: Secondary | ICD-10-CM

## 2018-01-30 DIAGNOSIS — N6012 Diffuse cystic mastopathy of left breast: Secondary | ICD-10-CM | POA: Diagnosis not present

## 2018-02-03 ENCOUNTER — Other Ambulatory Visit: Payer: Self-pay | Admitting: General Surgery

## 2018-02-03 DIAGNOSIS — N6489 Other specified disorders of breast: Secondary | ICD-10-CM

## 2018-02-04 ENCOUNTER — Encounter: Payer: Self-pay | Admitting: Family Medicine

## 2018-02-04 ENCOUNTER — Ambulatory Visit (INDEPENDENT_AMBULATORY_CARE_PROVIDER_SITE_OTHER): Payer: 59 | Admitting: Family Medicine

## 2018-02-04 VITALS — BP 128/78 | HR 109 | Temp 98.4°F | Ht 66.0 in | Wt 190.6 lb

## 2018-02-04 DIAGNOSIS — E785 Hyperlipidemia, unspecified: Secondary | ICD-10-CM | POA: Diagnosis not present

## 2018-02-04 DIAGNOSIS — Z7689 Persons encountering health services in other specified circumstances: Secondary | ICD-10-CM | POA: Diagnosis not present

## 2018-02-04 DIAGNOSIS — Z6831 Body mass index (BMI) 31.0-31.9, adult: Secondary | ICD-10-CM

## 2018-02-04 MED ORDER — PHENTERMINE HCL 37.5 MG PO CAPS: 37.5000 mg | ORAL_CAPSULE | ORAL | 1 refills | Status: DC

## 2018-02-04 NOTE — Progress Notes (Signed)
Subjective:   Patient ID: Gina Wise, female    DOB: October 03, 1961, 57 y.o.   MRN: [id number]  Gina Wise is a pleasant 57 y.o. year old female who presents to clinic today with Follow-up (Patient is here today for a 9-month-F/U.  On 3.21.19 she was here to discuss weight loss plan and was started on Phentermine 37.5mg  1qd and Crestor 5mg  1qhs.  Her weight at that tiem was 194 lbs 12.8 oz.  Today she weighs in at 190.6.) and Other (She also updates about referral to Dr. Purvis Wise who Dx with prolapsed vaginal cuff.  She referred to a Uro/GYN at [facility] to do this procedure.  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  She is scheduled for 5.7.19 for surgery.  Will have to D/C Phentermine until after surgery completed.  Also Dr. Hilarie Wise did Esophageal Dialation so she has only really been taking the Phentermine for 2 weeks.)  on 02/04/2018  HPI:  Obesity-  Restarted phentermine last month- 37.5 mg daily but only for past two weeks because she had an esophogeal dilatation done. She feels the phentermine works really well.  "I don't even think about food." Denies CP, SOB or palpitations.  Wt Readings from Last 3 Encounters:  02/04/18 190 lb 9.6 oz (86.5 kg)  01/22/18 196 lb (88.9 kg)  01/17/18 194 lb (88 kg)   HLD-  Also was started on Crestor 5 mg qhs in 11/2017. Lab Results  Component Value Date   CHOL 238 (H) 12/11/2017   HDL 49 12/11/2017   LDLCALC 167 (H) 12/11/2017   LDLDIRECT 144 (H) 09/03/2011   TRIG 112 12/11/2017   CHOLHDL 4.9 (H) 12/11/2017     Current Outpatient Medications on File Prior to Visit  Medication Sig Dispense Refill  . acetaminophen (TYLENOL) 500 MG tablet Take 1 tablet (500 mg total) by mouth every 6 (six) hours as needed. 30 tablet 0  . albuterol (PROVENTIL HFA;VENTOLIN HFA) 108 (90 Base) MCG/ACT inhaler Inhale 1-2 puffs into the lungs every 6 (six) hours as needed. 1 Inhaler 0  . amoxicillin (AMOXIL) 500  MG capsule Take 1 capsule (500 mg total) by mouth 3 (three) times daily. 21 capsule 2  . b complex vitamins capsule Take 1 capsule by mouth daily.    . Biotin 1000 MCG tablet Take by mouth.    . cetirizine (ZYRTEC) 10 MG tablet Take 10 mg by mouth daily.    Marland Kitchen dicyclomine (BENTYL) 20 MG tablet Take 1 tablet (20 mg total) by mouth 3 (three) times daily as needed for spasms. 60 tablet 2  . diphenoxylate-atropine (LOMOTIL) 2.5-0.025 MG tablet Take 1 tablet by mouth 3 (three) times daily as needed for diarrhea or loose stools. 60 tablet 1  . esomeprazole (NEXIUM) 40 MG capsule Take 1 capsule (40 mg total) by mouth 2 (two) times daily. (Patient taking differently: Take 40 mg by mouth 2 (two) times daily. DAW) 60 capsule 3  . guaiFENesin (MUCINEX) 600 MG 12 hr tablet Take 1 tablet (600 mg total) by mouth 2 (two) times daily as needed for cough or to loosen phlegm. 14 tablet 0  . hydrochlorothiazide (HYDRODIURIL) 25 MG tablet TAKE 1 TABLET BY MOUTH  DAILY 90 tablet 1  . hydroxychloroquine (PLAQUENIL) 200 MG tablet Take 200 mg by mouth 2 (two) times daily.     Marland Kitchen ipratropium (ATROVENT) 0.03 % nasal spray Place 2 sprays into both nostrils 2 (two) times  daily. Do not use for more than 5days. 56 mL 0  . levothyroxine (SYNTHROID, LEVOTHROID) 125 MCG tablet Take 1 tablet (125 mcg total) by mouth daily. 90 tablet 3  . phentermine 37.5 MG capsule Take 1 capsule (37.5 mg total) by mouth every morning. 30 capsule 1  . rosuvastatin (CRESTOR) 5 MG tablet Take 1 tablet (5 mg total) by mouth daily. 90 tablet 3  . sertraline (ZOLOFT) 100 MG tablet Take 0.5 tablets (50 mg total) by mouth daily. 45 tablet 2  . SUMAtriptan (IMITREX) 100 MG tablet TAKE 1 TABLET BY MOUTH  EVERY 2 HOURS AS NEEDED FOR MIGRAINE(S) AS DIRECTED 20 tablet 1  . Vitamin D, Cholecalciferol, 400 UNITS TABS Take 400 Units by mouth daily.     No current facility-administered medications on file prior to visit.     Allergies  Allergen Reactions  .  Contrast Media [Iodinated Diagnostic Agents] Hives and Itching  . Other Hives and Swelling    CATS AND CAT DANDER  . Codeine Nausea Only  . Erythromycin Nausea And Vomiting    Severe abdominal pain  . Iohexol Hives    Past Medical History:  Diagnosis Date  . 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 Allergy   . Anemia   . Bruising 05/10/2015  . CAP (community acquired pneumonia) 08/03/2015  . Chronic bronchitis ([place])    "get it q yr; in the spring time" (02/17/2014)  . Depression    "have taken zoloft since 1996" (02/17/2014)  . Diverticulosis   . Epistaxis 12/01/2015  . Gastritis   . GERD (gastroesophageal reflux disease)   . Grover's disease   . Hematemesis with nausea 08/01/2016  . Hiatal hernia   . High cholesterol   . Hypertension   . Hypothyroid   . Internal hemorrhoids   . Migraines    "maybe a couple/yr" (02/17/2014)  . Multiple environmental allergies   . Overweight(278.02)   . Peri-menopause   . Schatzki's ring   . Shingles 02/16/2015  . Sjogren's disease ([place])   . Systemic lupus ([place])     Past Surgical History:  Procedure Laterality Date  . APPENDECTOMY  1987  . ESOPHAGOGASTRODUODENOSCOPY N/A 02/28/2014   Procedure: ESOPHAGOGASTRODUODENOSCOPY (EGD);  Surgeon: Jerene Bears, MD;  Location: [facility] ENDOSCOPY;  Service: Endoscopy;  Laterality: N/A;  . ESOPHAGOGASTRODUODENOSCOPY N/A 08/02/2016   Procedure: ESOPHAGOGASTRODUODENOSCOPY (EGD);  Surgeon: Gatha Mayer, MD;  Location: [facility] ENDOSCOPY;  Service: Endoscopy;  Laterality: N/A;  . EXPLORATORY LAPAROTOMY  1987  . FOOT SURGERY Right ~1980   "don't know what they did; had to take something out"  . INGUINAL HERNIA REPAIR Right ~ 2003  . SHOULDER OPEN ROTATOR CUFF REPAIR Left 1995  . SHOULDER SURGERY Left 1997   "cartilage"  . TONSILLECTOMY AND ADENOIDECTOMY  1976  . TUBAL LIGATION  1998   laparoscopic  . VAGINAL HYSTERECTOMY  04/08/2012   menorrhagia    Family History  Problem Relation Age of Onset  . Hypertension  Father   . Vision loss Father   . Alcohol abuse Father   . Emphysema Father        Smoker  . Hearing loss Father   . Early death Father        Murdered  . Hypertension Mother   . Heart disease Mother   . Hepatitis Mother        Hep C  . Endometriosis Mother   . Pancreatic disease Mother   . Depression Mother   . Hearing loss Mother   .  Lupus Sister   . Endometriosis Sister   . Arthritis Sister   . Depression Sister   . Breast cancer Paternal Aunt        7's  . Lung cancer Paternal Uncle        smoker  . Colon cancer Maternal Grandfather   . Uterine cancer Maternal Aunt        50's  . Emphysema Paternal Aunt        Non-smoker/Died of Pneumonia  . Lung cancer Paternal Uncle   . Heart attack Maternal Uncle   . Parkinson's disease Maternal Grandmother   . Alcohol abuse Maternal Grandmother   . Heart disease Paternal Grandmother        Died during angioplasty surgery  . Aneurysm Paternal Grandfather   . Allergy (severe) Son        ASA-Idiopathic Thrombocytopenia  . Early death Maternal Uncle        Murdered  . Lung disease Paternal Aunt        Bronchiecstasis  . Lung cancer Paternal Uncle   . Early death Paternal Uncle        Carbon Monoxide Poisoning/Accidental  . Stomach cancer Neg Hx   . Pancreatic cancer Neg Hx     Social History   Socioeconomic History  . Marital status: Single    Spouse name: Not on file  . Number of children: Not on file  . Years of education: Not on file  . Highest education level: Not on file  Occupational History  . Not on file  Social Needs  . Financial resource strain: Not on file  . Food insecurity:    Worry: Not on file    Inability: Not on file  . Transportation needs:    Medical: Not on file    Non-medical: Not on file  Tobacco Use  . Smoking status: Never Smoker  . Smokeless tobacco: Never Used  Substance and Sexual Activity  . Alcohol use: Yes    Alcohol/week: 0.0 oz    Comment: occ  . Drug use: No  . Sexual  activity: Not on file  Lifestyle  . Physical activity:    Days per week: Not on file    Minutes per session: Not on file  . Stress: Not on file  Relationships  . Social connections:    Talks on phone: Not on file    Gets together: Not on file    Attends religious service: Not on file    Active member of club or organization: Not on file    Attends meetings of clubs or organizations: Not on file    Relationship status: Not on file  . Intimate partner violence:    Fear of current or ex partner: Not on file    Emotionally abused: Not on file    Physically abused: Not on file    Forced sexual activity: Not on file  Other Topics Concern  . Not on file  Social History Narrative   Works at urgent care.    The PMH, PSH, Social History, Family History, Medications, and allergies have been reviewed in [facility], and have been updated if relevant.   Review of Systems  Constitutional: Negative.   Respiratory: Negative.   Cardiovascular: Negative.   Musculoskeletal: Negative.   Skin: Negative.   Neurological: Negative.   Hematological: Negative.   Psychiatric/Behavioral: Negative.   All other systems reviewed and are negative.      Objective:    BP 128/78 (BP Location: Left  Arm, Patient Position: Sitting, Cuff Size: Normal)   Pulse (!) 109   Temp 98.4 F (36.9 C) (Oral)   Ht 5\' 6"  (1.676 m)   Wt 190 lb 9.6 oz (86.5 kg)   LMP 04/03/2012 Comment: Still has Cervix and Ovaries  SpO2 99%   BMI 30.76 kg/m    Physical Exam  Constitutional: She is oriented to person, place, and time. She appears well-developed and well-nourished. No distress.  HENT:  Head: Normocephalic and atraumatic.  Eyes: EOM are normal.  Neck: Normal range of motion.  Cardiovascular: Normal rate.  Pulmonary/Chest: Effort normal and breath sounds normal.  Neurological: She is alert and oriented to person, place, and time. No cranial nerve deficit.  Skin: Skin is warm. She is not diaphoretic.  Psychiatric: She  has a normal mood and affect. Her behavior is normal. Judgment and thought content normal.  Nursing note and vitals reviewed.         Assessment & Plan:   BMI 31.0-31.9,adult  Dyslipidemia No follow-ups on file.

## 2018-02-04 NOTE — Assessment & Plan Note (Signed)
Check lipid panel today. No changes made to rxs.

## 2018-02-04 NOTE — Patient Instructions (Signed)
Great to see you. I will call you with your lab results from today and you can view them online.   

## 2018-02-04 NOTE — Assessment & Plan Note (Signed)
Doing well with phentermine. She is aware of risks and side effects. She would like to continue taking it. Rx refilled. Follow up in 3 months. When BMI < 27 will wean off of rx.

## 2018-02-05 ENCOUNTER — Encounter (HOSPITAL_BASED_OUTPATIENT_CLINIC_OR_DEPARTMENT_OTHER): Payer: Self-pay | Admitting: *Deleted

## 2018-02-05 ENCOUNTER — Other Ambulatory Visit: Payer: Self-pay

## 2018-02-05 LAB — COMPREHENSIVE METABOLIC PANEL
A/G RATIO: 1.7 (ref 1.2–2.2)
ALT: 10 IU/L (ref 0–32)
AST: 14 IU/L (ref 0–40)
Albumin: 4.8 g/dL (ref 3.5–5.5)
Alkaline Phosphatase: 76 IU/L (ref 39–117)
BUN/Creatinine Ratio: 11 (ref 9–23)
BUN: 10 mg/dL (ref 6–24)
Bilirubin Total: 0.6 mg/dL (ref 0.0–1.2)
CALCIUM: 9.8 mg/dL (ref 8.7–10.2)
CO2: 21 mmol/L (ref 20–29)
Chloride: 103 mmol/L (ref 96–106)
Creatinine, Ser: 0.92 mg/dL (ref 0.57–1.00)
GFR calc Af Amer: 81 mL/min/{1.73_m2} (ref >59)
GFR, EST NON AFRICAN AMERICAN: 70 mL/min/{1.73_m2} (ref >59)
GLUCOSE: 89 mg/dL (ref 65–99)
Globulin, Total: 2.8 g/dL (ref 1.5–4.5)
POTASSIUM: 3.9 mmol/L (ref 3.5–5.2)
Sodium: 141 mmol/L (ref 134–144)
Total Protein: 7.6 g/dL (ref 6.0–8.5)

## 2018-02-05 LAB — LIPID PANEL
CHOL/HDL RATIO: 3.1 ratio (ref 0.0–4.4)
Cholesterol, Total: 169 mg/dL (ref 100–199)
HDL: 54 mg/dL (ref >39)
LDL Calculated: 89 mg/dL (ref 0–99)
TRIGLYCERIDES: 131 mg/dL (ref 0–149)
VLDL Cholesterol Cal: 26 mg/dL (ref 5–40)

## 2018-02-06 NOTE — Progress Notes (Signed)
Called and spoke with pt to have her come in on Friday, May 3rd to have pre surgical EKG and lab work completed before she has her see placed on Monday, May 6th and her surgery on Tuesday, May 7th. Pt stated that she understood the need to have labs drawn before the day the seed is placed. Informed her to call back with any questions.

## 2018-02-07 ENCOUNTER — Encounter: Payer: Self-pay | Admitting: Family Medicine

## 2018-02-13 ENCOUNTER — Telehealth: Payer: Self-pay

## 2018-02-13 NOTE — Telephone Encounter (Signed)
PA approved for Phentermine until 10.25.2019 per [facility]/thx dmf

## 2018-02-13 NOTE — Telephone Encounter (Signed)
PA initiated via [facility] for Phentermine 37.5mg  caps 30 per 30d/thx dmf

## 2018-02-19 ENCOUNTER — Other Ambulatory Visit: Payer: Self-pay

## 2018-02-19 ENCOUNTER — Encounter: Payer: Self-pay | Admitting: Family Medicine

## 2018-02-19 MED ORDER — HYDROCHLOROTHIAZIDE 25 MG PO TABS: 25.0000 mg | ORAL_TABLET | Freq: Every day | ORAL | 1 refills | Status: DC

## 2018-02-20 ENCOUNTER — Telehealth: Payer: Self-pay | Admitting: Obstetrics & Gynecology

## 2018-02-20 ENCOUNTER — Encounter: Payer: Self-pay | Admitting: Obstetrics & Gynecology

## 2018-02-20 NOTE — Telephone Encounter (Signed)
TC to pt. Left message. Just wanted check on her in the face of her recent dx.   Barrington Worley L. Harraway-Smith, M.D., Cherlynn June

## 2018-02-21 ENCOUNTER — Encounter (HOSPITAL_BASED_OUTPATIENT_CLINIC_OR_DEPARTMENT_OTHER)
Admission: RE | Admit: 2018-02-21 | Discharge: 2018-02-21 | Disposition: A | Discharge status: Home / Self Care | Payer: 59 | Source: Ambulatory Visit | Attending: General Surgery | Admitting: General Surgery

## 2018-02-21 ENCOUNTER — Other Ambulatory Visit: Payer: Self-pay

## 2018-02-21 DIAGNOSIS — Z01818 Encounter for other preprocedural examination: Secondary | ICD-10-CM | POA: Insufficient documentation

## 2018-02-21 LAB — BASIC METABOLIC PANEL
ANION GAP: 14 (ref 5–15)
BUN: 13 mg/dL (ref 6–20)
CALCIUM: 9.4 mg/dL (ref 8.9–10.3)
CHLORIDE: 102 mmol/L (ref 101–111)
CO2: 20 mmol/L — AB (ref 22–32)
CREATININE: 0.8 mg/dL (ref 0.44–1.00)
GFR calc non Af Amer: >60 mL/min (ref >60)
Glucose, Bld: 90 mg/dL (ref 65–99)
POTASSIUM: 4.1 mmol/L (ref 3.5–5.1)
Sodium: 136 mmol/L (ref 135–145)

## 2018-02-21 NOTE — Progress Notes (Signed)
Pt in for PAT appt, Ensure given and instructions reviewed.

## 2018-02-24 ENCOUNTER — Ambulatory Visit
Admission: RE | Admit: 2018-02-24 | Discharge: 2018-02-24 | Disposition: A | Discharge status: Home / Self Care | Payer: 59 | Source: Ambulatory Visit | Attending: General Surgery | Admitting: General Surgery

## 2018-02-24 DIAGNOSIS — R928 Other abnormal and inconclusive findings on diagnostic imaging of breast: Secondary | ICD-10-CM | POA: Diagnosis not present

## 2018-02-24 DIAGNOSIS — N6489 Other specified disorders of breast: Secondary | ICD-10-CM

## 2018-02-25 ENCOUNTER — Ambulatory Visit
Admission: RE | Admit: 2018-02-25 | Discharge: 2018-02-25 | Disposition: A | Discharge status: Home / Self Care | Payer: 59 | Source: Ambulatory Visit | Attending: General Surgery | Admitting: General Surgery

## 2018-02-25 ENCOUNTER — Ambulatory Visit (HOSPITAL_BASED_OUTPATIENT_CLINIC_OR_DEPARTMENT_OTHER): Payer: 59 | Admitting: Anesthesiology

## 2018-02-25 ENCOUNTER — Other Ambulatory Visit: Payer: Self-pay

## 2018-02-25 ENCOUNTER — Ambulatory Visit (HOSPITAL_BASED_OUTPATIENT_CLINIC_OR_DEPARTMENT_OTHER)
Admission: RE | Admit: 2018-02-25 | Discharge: 2018-02-25 | Disposition: A | Discharge status: Home / Self Care | Payer: 59 | Source: Ambulatory Visit | Attending: General Surgery | Admitting: General Surgery

## 2018-02-25 ENCOUNTER — Encounter (HOSPITAL_BASED_OUTPATIENT_CLINIC_OR_DEPARTMENT_OTHER): Payer: Self-pay | Admitting: Anesthesiology

## 2018-02-25 ENCOUNTER — Encounter (HOSPITAL_BASED_OUTPATIENT_CLINIC_OR_DEPARTMENT_OTHER)
Admission: RE | Disposition: A | Discharge status: Home / Self Care | Payer: Self-pay | Source: Ambulatory Visit | Attending: General Surgery

## 2018-02-25 DIAGNOSIS — N6489 Other specified disorders of breast: Secondary | ICD-10-CM

## 2018-02-25 DIAGNOSIS — E039 Hypothyroidism, unspecified: Secondary | ICD-10-CM | POA: Insufficient documentation

## 2018-02-25 DIAGNOSIS — N63 Unspecified lump in unspecified breast: Secondary | ICD-10-CM | POA: Diagnosis not present

## 2018-02-25 DIAGNOSIS — Z803 Family history of malignant neoplasm of breast: Secondary | ICD-10-CM | POA: Insufficient documentation

## 2018-02-25 DIAGNOSIS — Z79899 Other long term (current) drug therapy: Secondary | ICD-10-CM | POA: Insufficient documentation

## 2018-02-25 DIAGNOSIS — D242 Benign neoplasm of left breast: Secondary | ICD-10-CM | POA: Diagnosis not present

## 2018-02-25 DIAGNOSIS — M3500 Sicca syndrome, unspecified: Secondary | ICD-10-CM | POA: Diagnosis not present

## 2018-02-25 DIAGNOSIS — R928 Other abnormal and inconclusive findings on diagnostic imaging of breast: Secondary | ICD-10-CM | POA: Diagnosis not present

## 2018-02-25 DIAGNOSIS — N6022 Fibroadenosis of left breast: Secondary | ICD-10-CM | POA: Insufficient documentation

## 2018-02-25 DIAGNOSIS — K219 Gastro-esophageal reflux disease without esophagitis: Secondary | ICD-10-CM | POA: Insufficient documentation

## 2018-02-25 DIAGNOSIS — F329 Major depressive disorder, single episode, unspecified: Secondary | ICD-10-CM | POA: Insufficient documentation

## 2018-02-25 DIAGNOSIS — I10 Essential (primary) hypertension: Secondary | ICD-10-CM | POA: Insufficient documentation

## 2018-02-25 DIAGNOSIS — Z8249 Family history of ischemic heart disease and other diseases of the circulatory system: Secondary | ICD-10-CM | POA: Insufficient documentation

## 2018-02-25 DIAGNOSIS — N6012 Diffuse cystic mastopathy of left breast: Secondary | ICD-10-CM | POA: Insufficient documentation

## 2018-02-25 DIAGNOSIS — N6320 Unspecified lump in the left breast, unspecified quadrant: Secondary | ICD-10-CM | POA: Diagnosis present

## 2018-02-25 DIAGNOSIS — E7800 Pure hypercholesterolemia, unspecified: Secondary | ICD-10-CM | POA: Diagnosis not present

## 2018-02-25 DIAGNOSIS — N6092 Unspecified benign mammary dysplasia of left breast: Secondary | ICD-10-CM | POA: Insufficient documentation

## 2018-02-25 HISTORY — PX: RADIOACTIVE SEED GUIDED EXCISIONAL BREAST BIOPSY: SHX6490

## 2018-02-25 HISTORY — DX: Dyspnea, unspecified: R06.00

## 2018-02-25 SURGERY — RADIOACTIVE SEED GUIDED BREAST BIOPSY
Anesthesia: General | Site: Breast | Laterality: Left

## 2018-02-25 MED ORDER — GABAPENTIN 100 MG PO CAPS
ORAL_CAPSULE | ORAL | Status: AC
  Filled 2018-02-25: qty 1

## 2018-02-25 MED ORDER — OXYCODONE HCL 5 MG/5ML PO SOLN: 5.0000 mg | Freq: Once | ORAL | Status: AC | PRN

## 2018-02-25 MED ORDER — DEXAMETHASONE SODIUM PHOSPHATE 10 MG/ML IJ SOLN
INTRAMUSCULAR | Status: AC
  Filled 2018-02-25: qty 1

## 2018-02-25 MED ORDER — ACETAMINOPHEN 10 MG/ML IV SOLN
1000.0000 mg | Freq: Once | INTRAVENOUS | Status: AC
  Administered 2018-02-25: 1000 mg via INTRAVENOUS

## 2018-02-25 MED ORDER — ACETAMINOPHEN 500 MG PO TABS
ORAL_TABLET | ORAL | Status: AC
  Filled 2018-02-25: qty 2

## 2018-02-25 MED ORDER — MIDAZOLAM HCL 2 MG/2ML IJ SOLN
INTRAMUSCULAR | Status: AC
  Filled 2018-02-25: qty 2

## 2018-02-25 MED ORDER — DEXAMETHASONE SODIUM PHOSPHATE 4 MG/ML IJ SOLN
INTRAMUSCULAR | Status: DC | PRN
  Administered 2018-02-25: 10 mg via INTRAVENOUS

## 2018-02-25 MED ORDER — TRAMADOL HCL 50 MG PO TABS: 50.0000 mg | ORAL_TABLET | Freq: Four times a day (QID) | ORAL | 0 refills | Status: DC | PRN

## 2018-02-25 MED ORDER — SCOPOLAMINE 1 MG/3DAYS TD PT72
1.0000 | MEDICATED_PATCH | Freq: Once | TRANSDERMAL | Status: DC | PRN
  Administered 2018-02-25: 1.5 mg via TRANSDERMAL

## 2018-02-25 MED ORDER — FENTANYL CITRATE (PF) 100 MCG/2ML IJ SOLN
25.0000 ug | INTRAMUSCULAR | Status: DC | PRN
  Administered 2018-02-25: 50 ug via INTRAVENOUS
  Administered 2018-02-25: 25 ug via INTRAVENOUS

## 2018-02-25 MED ORDER — ENSURE PRE-SURGERY PO LIQD: 296.0000 mL | Freq: Once | ORAL | Status: DC

## 2018-02-25 MED ORDER — BUPIVACAINE HCL (PF) 0.25 % IJ SOLN
INTRAMUSCULAR | Status: AC
  Filled 2018-02-25: qty 30

## 2018-02-25 MED ORDER — CEFAZOLIN SODIUM-DEXTROSE 2-4 GM/100ML-% IV SOLN
INTRAVENOUS | Status: AC
  Filled 2018-02-25: qty 100

## 2018-02-25 MED ORDER — LIDOCAINE 2% (20 MG/ML) 5 ML SYRINGE
INTRAMUSCULAR | Status: DC | PRN
  Administered 2018-02-25: 70 mg via INTRAVENOUS

## 2018-02-25 MED ORDER — PROPOFOL 500 MG/50ML IV EMUL
INTRAVENOUS | Status: AC
  Filled 2018-02-25: qty 50

## 2018-02-25 MED ORDER — OXYCODONE HCL 5 MG PO TABS
ORAL_TABLET | ORAL | Status: AC
  Filled 2018-02-25: qty 1

## 2018-02-25 MED ORDER — ACETAMINOPHEN 500 MG PO TABS: 1000.0000 mg | ORAL_TABLET | ORAL | Status: DC

## 2018-02-25 MED ORDER — ONDANSETRON HCL 4 MG/2ML IJ SOLN
INTRAMUSCULAR | Status: AC
  Filled 2018-02-25: qty 2

## 2018-02-25 MED ORDER — ACETAMINOPHEN 10 MG/ML IV SOLN
INTRAVENOUS | Status: AC
  Filled 2018-02-25: qty 100

## 2018-02-25 MED ORDER — OXYCODONE HCL 5 MG PO TABS
5.0000 mg | ORAL_TABLET | Freq: Once | ORAL | Status: AC | PRN
  Administered 2018-02-25: 5 mg via ORAL

## 2018-02-25 MED ORDER — FENTANYL CITRATE (PF) 100 MCG/2ML IJ SOLN
INTRAMUSCULAR | Status: AC
  Filled 2018-02-25: qty 2

## 2018-02-25 MED ORDER — ONDANSETRON HCL 4 MG/2ML IJ SOLN
INTRAMUSCULAR | Status: DC | PRN
  Administered 2018-02-25: 4 mg via INTRAVENOUS

## 2018-02-25 MED ORDER — FENTANYL CITRATE (PF) 100 MCG/2ML IJ SOLN
50.0000 ug | INTRAMUSCULAR | Status: DC | PRN
  Administered 2018-02-25: 100 ug via INTRAVENOUS

## 2018-02-25 MED ORDER — CEFAZOLIN SODIUM-DEXTROSE 2-4 GM/100ML-% IV SOLN
2.0000 g | INTRAVENOUS | Status: AC
  Administered 2018-02-25: 2 g via INTRAVENOUS

## 2018-02-25 MED ORDER — GABAPENTIN 100 MG PO CAPS
100.0000 mg | ORAL_CAPSULE | ORAL | Status: AC
  Administered 2018-02-25: 100 mg via ORAL

## 2018-02-25 MED ORDER — BUPIVACAINE HCL (PF) 0.25 % IJ SOLN
INTRAMUSCULAR | Status: DC | PRN
  Administered 2018-02-25: 10 mL

## 2018-02-25 MED ORDER — SCOPOLAMINE 1 MG/3DAYS TD PT72
MEDICATED_PATCH | TRANSDERMAL | Status: AC
  Filled 2018-02-25: qty 1

## 2018-02-25 MED ORDER — PROPOFOL 10 MG/ML IV BOLUS
INTRAVENOUS | Status: DC | PRN
  Administered 2018-02-25: 140 mg via INTRAVENOUS

## 2018-02-25 MED ORDER — LACTATED RINGERS IV SOLN
INTRAVENOUS | Status: DC
  Administered 2018-02-25: 08:00:00 via INTRAVENOUS

## 2018-02-25 MED ORDER — ONDANSETRON HCL 4 MG/2ML IJ SOLN: 4.0000 mg | Freq: Four times a day (QID) | INTRAMUSCULAR | Status: DC | PRN

## 2018-02-25 MED ORDER — MIDAZOLAM HCL 2 MG/2ML IJ SOLN
1.0000 mg | INTRAMUSCULAR | Status: DC | PRN
  Administered 2018-02-25: 2 mg via INTRAVENOUS

## 2018-02-25 SURGICAL SUPPLY — 62 items
ADH SKN CLS APL DERMABOND .7 (GAUZE/BANDAGES/DRESSINGS) ×1
APPLIER CLIP 9.375 MED OPEN (MISCELLANEOUS)
APR CLP MED 9.3 20 MLT OPN (MISCELLANEOUS)
BINDER BREAST LRG (GAUZE/BANDAGES/DRESSINGS) IMPLANT
BINDER BREAST MEDIUM (GAUZE/BANDAGES/DRESSINGS) IMPLANT
BINDER BREAST XLRG (GAUZE/BANDAGES/DRESSINGS) IMPLANT
BINDER BREAST XXLRG (GAUZE/BANDAGES/DRESSINGS) IMPLANT
BLADE SURG 15 STRL LF DISP TIS (BLADE) ×1 IMPLANT
BLADE SURG 15 STRL SS (BLADE) ×2
CANISTER SUC SOCK COL 7IN (MISCELLANEOUS) IMPLANT
CANISTER SUCT 1200ML W/VALVE (MISCELLANEOUS) IMPLANT
CHLORAPREP W/TINT 26ML (MISCELLANEOUS) ×2 IMPLANT
CLIP APPLIE 9.375 MED OPEN (MISCELLANEOUS) IMPLANT
CLIP VESOCCLUDE SM WIDE 6/CT (CLIP) IMPLANT
COVER BACK TABLE 60X90IN (DRAPES) ×2 IMPLANT
COVER MAYO STAND STRL (DRAPES) ×2 IMPLANT
COVER PROBE W GEL 5X96 (DRAPES) ×2 IMPLANT
DECANTER SPIKE VIAL GLASS SM (MISCELLANEOUS) IMPLANT
DERMABOND ADVANCED (GAUZE/BANDAGES/DRESSINGS) ×1
DERMABOND ADVANCED .7 DNX12 (GAUZE/BANDAGES/DRESSINGS) ×1 IMPLANT
DEVICE DUBIN W/COMP PLATE 8390 (MISCELLANEOUS) ×2 IMPLANT
DRAPE LAPAROSCOPIC ABDOMINAL (DRAPES) ×2 IMPLANT
DRAPE UTILITY XL STRL (DRAPES) ×2 IMPLANT
DRSG TEGADERM 4X4.75 (GAUZE/BANDAGES/DRESSINGS) IMPLANT
ELECT COATED BLADE 2.86 ST (ELECTRODE) ×2 IMPLANT
ELECT REM PT RETURN 9FT ADLT (ELECTROSURGICAL) ×2
ELECTRODE REM PT RTRN 9FT ADLT (ELECTROSURGICAL) ×1 IMPLANT
GAUZE SPONGE 4X4 12PLY STRL LF (GAUZE/BANDAGES/DRESSINGS) IMPLANT
GLOVE BIO SURGEON STRL SZ7 (GLOVE) ×4 IMPLANT
GLOVE BIOGEL PI IND STRL 6.5 (GLOVE) IMPLANT
GLOVE BIOGEL PI IND STRL 7.0 (GLOVE) IMPLANT
GLOVE BIOGEL PI IND STRL 7.5 (GLOVE) ×1 IMPLANT
GLOVE BIOGEL PI INDICATOR 6.5 (GLOVE) ×1
GLOVE BIOGEL PI INDICATOR 7.0 (GLOVE) ×1
GLOVE BIOGEL PI INDICATOR 7.5 (GLOVE) ×1
GLOVE ECLIPSE 6.5 STRL STRAW (GLOVE) ×1 IMPLANT
GOWN STRL REUS W/ TWL LRG LVL3 (GOWN DISPOSABLE) ×2 IMPLANT
GOWN STRL REUS W/TWL LRG LVL3 (GOWN DISPOSABLE) ×4
HEMOSTAT ARISTA ABSORB 3G PWDR (MISCELLANEOUS) IMPLANT
ILLUMINATOR WAVEGUIDE N/F (MISCELLANEOUS) IMPLANT
KIT MARKER MARGIN INK (KITS) ×2 IMPLANT
LIGHT WAVEGUIDE WIDE FLAT (MISCELLANEOUS) IMPLANT
NDL HYPO 25X1 1.5 SAFETY (NEEDLE) ×1 IMPLANT
NEEDLE HYPO 25X1 1.5 SAFETY (NEEDLE) ×2 IMPLANT
NS IRRIG 1000ML POUR BTL (IV SOLUTION) IMPLANT
PACK BASIN DAY SURGERY FS (CUSTOM PROCEDURE TRAY) ×2 IMPLANT
PENCIL BUTTON HOLSTER BLD 10FT (ELECTRODE) ×2 IMPLANT
SLEEVE SCD COMPRESS KNEE MED (MISCELLANEOUS) ×2 IMPLANT
SPONGE LAP 4X18 X RAY DECT (DISPOSABLE) ×2 IMPLANT
STRIP CLOSURE SKIN 1/2X4 (GAUZE/BANDAGES/DRESSINGS) ×2 IMPLANT
SUT MNCRL AB 4-0 PS2 18 (SUTURE) IMPLANT
SUT MON AB 5-0 PS2 18 (SUTURE) IMPLANT
SUT SILK 2 0 SH (SUTURE) IMPLANT
SUT VIC AB 2-0 SH 27 (SUTURE) ×2
SUT VIC AB 2-0 SH 27XBRD (SUTURE) ×1 IMPLANT
SUT VIC AB 3-0 SH 27 (SUTURE) ×2
SUT VIC AB 3-0 SH 27X BRD (SUTURE) ×1 IMPLANT
SYR CONTROL 10ML LL (SYRINGE) ×2 IMPLANT
TOWEL OR 17X24 6PK STRL BLUE (TOWEL DISPOSABLE) ×2 IMPLANT
TOWEL OR NON WOVEN STRL DISP B (DISPOSABLE) ×2 IMPLANT
TUBE CONNECTING 20X1/4 (TUBING) IMPLANT
YANKAUER SUCT BULB TIP NO VENT (SUCTIONS) IMPLANT

## 2018-02-25 NOTE — Anesthesia Postprocedure Evaluation (Signed)
Anesthesia Post Note  Patient: Gina Wise  Procedure(s) Performed: RADIOACTIVE SEED GUIDED EXCISIONAL BREAST BIOPSY (Left Breast)     Patient location during evaluation: PACU Anesthesia Type: General Level of consciousness: awake and alert Pain management: pain level controlled Vital Signs Assessment: post-procedure vital signs reviewed and stable Respiratory status: spontaneous breathing, nonlabored ventilation, respiratory function stable and patient connected to nasal cannula oxygen Cardiovascular status: blood pressure returned to baseline and stable Postop Assessment: no apparent nausea or vomiting Anesthetic complications: no    Last Vitals:  Vitals:   02/25/18 1100 02/25/18 1130  BP: (!) 150/85 (!) 150/87  Pulse: 84 84  Resp: 12 16  Temp:  36.7 C  SpO2: 98% 100%    Last Pain:  Vitals:   02/25/18 1130  TempSrc:   PainSc: [place]

## 2018-02-25 NOTE — Transfer of Care (Signed)
Immediate Anesthesia Transfer of Care Note  Patient: Gina Wise  Procedure(s) Performed: RADIOACTIVE SEED GUIDED EXCISIONAL BREAST BIOPSY (Left Breast)  Patient Location: PACU  Anesthesia Type:General  Level of Consciousness: sedated  Airway & Oxygen Therapy: Patient Spontanous Breathing and Patient connected to face mask oxygen  Post-op Assessment: Report given to RN and Post -op Vital signs reviewed and stable  Post vital signs: Reviewed and stable  Last Vitals:  Vitals Value Taken Time  BP 138/93 02/25/2018 10:04 AM  Temp    Pulse 92 02/25/2018 10:06 AM  Resp 15 02/25/2018 10:06 AM  SpO2 100 % 02/25/2018 10:06 AM  Vitals shown include unvalidated device data.  Last Pain:  Vitals:   02/25/18 0756  TempSrc: Oral  PainSc: 0-No pain      Patients Stated Pain Goal: 3 (82/64/15 8309)  Complications: No apparent anesthesia complications

## 2018-02-25 NOTE — H&P (Signed)
Gina Wise referred by Dr Deborra Medina for new left breast mass. she has pmh of sjogrens and borderline lupus. she in on plaquenil. last steroids over a year ago. she has no personal history of breast disease. she has paternal aunt in 59s with breast cancer. she underwent screening mm that shows c density breasts. this showed a possible mass in the left breast. US shows nl nodes and no [place] correlate. core biopsy with clip placement shows csl. she is here to discuss options. she notes no mass or dc. she works Forensic scientist for [employer]  Past Surgical History Illene Regulus, [place]; 02/03/2018 9:53 AM) Appendectomy  Breast Biopsy  Left. Cesarean Section - Multiple  Foot Surgery  Right. Hysterectomy (not due to cancer) - Partial  Laparoscopic Inguinal Hernia Surgery  Right. Shoulder Surgery  Left. Tonsillectomy   Diagnostic Studies History Illene Regulus, CMA; 02/03/2018 9:53 AM) Colonoscopy  within last year Mammogram  within last year Pap Smear  >5 years ago  Allergies Sabino Gasser; 02/03/2018 9:34 AM) No Known Drug Allergies [02/03/2018]: Allergies Reconciled   Medication History Sabino Gasser; 02/03/2018 9:35 AM) Hydromet (5-1.5MG Laurian Brim Syrup, Oral) Active. Levothyroxine Sodium (125MCG Tablet, Oral) Active. Rosuvastatin Calcium (5MG  Tablet, Oral) Active. Oseltamivir Phosphate (75MG  Capsule, Oral) Active. SUMAtriptan Succinate (100MG  Tablet, Oral) Active. Ipratropium Bromide (0.03% Solution, Nasal) Active. Medications Reconciled  Social History Illene Regulus, CMA; 02/03/2018 9:53 AM) Alcohol use  Occasional alcohol use. Caffeine use  Carbonated beverages. No drug use  Tobacco use  Never smoker.  Family History Illene Regulus, [place]; 02/03/2018 9:53 AM) Alcohol Abuse  Father. Depression  Mother. Heart Disease  Father. Heart disease in female family member before age 65  Hypertension  Father, Mother. Ischemic Bowel Disease  Mother. Respiratory  Condition  Father. Thyroid problems  Mother.  Pregnancy / Birth History Illene Regulus, CMA; 02/03/2018 9:53 AM) Age at menarche  13 years. Contraceptive History  Depo-provera, Oral contraceptives. Gravida  2 Maternal age  19-30 Para  2  Other Problems Illene Regulus, CMA; 02/03/2018 9:53 AM) Gastroesophageal Reflux Disease  High blood pressure  Hypercholesterolemia  Inguinal Hernia  Migraine Headache  Thyroid Disease     Review of Systems (Alisha Spillers CMA; 02/03/2018 9:53 AM) General Present- Fatigue and Weight Gain. Not Present- Appetite Loss, Chills, Fever, Night Sweats and Weight Loss. Skin Present- Dryness. Not Present- Change in Wart/Mole, Hives, Jaundice, New Lesions, Non-Healing Wounds, Rash and Ulcer. HEENT Present- Hoarseness, Ringing in the Ears, Seasonal Allergies and Wears glasses/contact lenses. Not Present- Earache, Hearing Loss, Nose Bleed, Oral Ulcers, Sinus Pain, Sore Throat, Visual Disturbances and Yellow Eyes. Respiratory Present- Chronic Cough. Not Present- Bloody sputum, Difficulty Breathing, Snoring and Wheezing. Breast Not Present- Breast Mass, Breast Pain, Nipple Discharge and Skin Changes. Cardiovascular Present- Shortness of Breath. Not Present- Chest Pain, Difficulty Breathing Lying Down, Leg Cramps, Palpitations, Rapid Heart Rate and Swelling of Extremities. Gastrointestinal Not Present- Abdominal Pain, Bloating, Bloody Stool, Change in Bowel Habits, Chronic diarrhea, Constipation, Difficulty Swallowing, Excessive gas, Gets full quickly at meals, Hemorrhoids, Indigestion, Nausea, Rectal Pain and Vomiting. Female Genitourinary Present- Nocturia and Urgency. Not Present- Frequency, Painful Urination and Pelvic Pain. Musculoskeletal Not Present- Back Pain, Joint Pain, Joint Stiffness, Muscle Pain, Muscle Weakness and Swelling of Extremities. Neurological Not Present- Decreased Memory, Fainting, Headaches, Numbness, Seizures, Tingling, Tremor,  Trouble walking and Weakness. Psychiatric Present- Change in Sleep Pattern. Not Present- Anxiety, Bipolar, Depression, Fearful and Frequent crying. Endocrine Present- Hair Changes. Not Present- Cold Intolerance, Excessive Hunger, Heat Intolerance, Hot flashes and New  Diabetes. Hematology Not Present- Blood Thinners, Easy Bruising, Excessive bleeding, Gland problems, HIV and Persistent Infections.  Vitals Sabino Gasser; 02/03/2018 9:36 AM) 02/03/2018 9:35 AM Weight: 192.25 lb Height: 66in Body Surface Area: 1.97 m Body Mass Index: 31.03 kg/m  Temp.: 98.25F(Oral)  Pulse: 117 (Regular)  BP: 130/82 (Sitting, Left Arm, Standard) Physical Exam Rolm Bookbinder MD; 02/03/2018 1:48 PM) General Mental Status-Alert. Orientation-Oriented X3. Head and Neck Trachea-midline. Eye Sclera/Conjunctiva - Bilateral-No scleral icterus. Chest and Lung Exam Chest and lung exam reveals -quiet, even and easy respiratory effort with no use of accessory muscles and on auscultation, normal breath sounds, no adventitious sounds and normal vocal resonance. Breast Nipples-No Discharge. Breast Lump-No Palpable Breast Mass. Cardiovascular Cardiovascular examination reveals -normal heart sounds, regular rate and rhythm with no murmurs. Lymphatic Head & Neck General Head & Neck Lymphatics: Bilateral - Description - Normal. Axillary General Axillary Region: Bilateral - Description - Normal. Note: no [place] adenopathy   Assessment & Plan Rolm Bookbinder MD; 02/03/2018 1:51 PM) RADIAL SCAR OF BREAST (N64.89) Story: Left breast seed guided excisional biopsy discussed option of observation vs surgery. discussed small upgrade rate for csl. she desires excision. will do with seed guidance. discussed risks, recovery including one week off work. will proceed soon.

## 2018-02-25 NOTE — Op Note (Signed)
Preoperative diagnoses:Left breast mass with core biopsy c/w csl Postoperative diagnosis: Same as above Procedure:Left breastseed guided excisional biopsy Surgeon: Dr. Serita Grammes Anesthesia: Gen. Estimated blood loss: minimal Complications: None Drains: None Specimens:Left breast tissue marked with paint Sponge and needle count correct at completion Disposition to recovery stable  Indications: This is a23 yof with abnormal mammogram.  A distortion was biopsied and is a csl.  She was given options of observation vs excision. We have elected to proceed with excision. A seed was placed prior to beginning..  I had the mammograms in the OR.  Procedure: After informed consent was obtained she was then taken to the operating room. She was given antibiotics.Sequential compression devices were on her legs. She was placed under general anesthesia without complication. Her chestwas then prepped and draped in the standard sterile surgical fashion. A surgical timeout was then performed.   The seed was in the central left breast. I infiltrated marcaine and then made a periareolar incision to hide the scar.I then used the neoprobe to guide excision of the seed and the surrounding tissue. Mammogram confirmed removal of the seed and the clip.  This was then all sent to pathology. Hemostasis was observed. I closed the breast tissue with a 2-0 Vicryl. The dermis was closed with 3-0 Vicryl and the skin with 5-0 Monocryl.Dermabond and steristrips were placed on the incision. A breast binder was placed. She was transferred to recovery stable

## 2018-02-25 NOTE — Discharge Instructions (Signed)
[place] Office Phone Number [phone]  BREAST BIOPSY/ PARTIAL MASTECTOMY: POST OP INSTRUCTIONS  Always review your discharge instruction sheet given to you by the facility where your surgery was performed.  IF YOU HAVE DISABILITY OR FAMILY LEAVE FORMS, YOU MUST BRING THEM TO THE OFFICE FOR PROCESSING.  DO NOT GIVE THEM TO YOUR DOCTOR.  1. A prescription for pain medication may be given to you upon discharge.  Take your pain medication as prescribed, if needed.  If narcotic pain medicine is not needed, then you may take acetaminophen (Tylenol), naprosyn (Alleve) or ibuprofen (Advil) as needed. 1 (5mg ) Oxycodone given at 11:00 2. Take your usually prescribed medications unless otherwise directed 3. If you need a refill on your pain medication, please contact your pharmacy.  They will contact our office to request authorization.  Prescriptions will not be filled after 5pm or on week-ends. 4. You should eat very light the first 24 hours after surgery, such as soup, crackers, pudding, etc.  Resume your normal diet the day after surgery. 5. Most patients will experience some swelling and bruising in the breast.  Ice packs and a good support bra will help.  Wear the breast binder provided or a sports bra for 72 hours day and night.  After that wear a sports bra during the day until you return to the office. Swelling and bruising can take several days to resolve.  6. It is common to experience some constipation if taking pain medication after surgery.  Increasing fluid intake and taking a stool softener will usually help or prevent this problem from occurring.  A mild laxative (Milk of Magnesia or Miralax) should be taken according to package directions if there are no bowel movements after 48 hours. 7. Unless discharge instructions indicate otherwise, you may remove your bandages 48 hours after surgery and you may shower at that time.  You may have steri-strips (small skin tapes) in  place directly over the incision.  These strips should be left on the skin for 7-10 days and will come off on their own.  If your surgeon used skin glue on the incision, you may shower in 24 hours.  The glue will flake off over the next 2-3 weeks.  Any sutures or staples will be removed at the office during your follow-up visit. 8. ACTIVITIES:  You may resume regular daily activities (gradually increasing) beginning the next day.  Wearing a good support bra or sports bra minimizes pain and swelling.  You may have sexual intercourse when it is comfortable. a. You may drive when you no longer are taking prescription pain medication, you can comfortably wear a seatbelt, and you can safely maneuver your car and apply brakes. b. RETURN TO WORK:  ______________________________________________________________________________________ 9. You should see your doctor in the office for a follow-up appointment approximately two weeks after your surgery.  Your doctors nurse will typically make your follow-up appointment when she calls you with your pathology report.  Expect your pathology report 3-4 business days after your surgery.  You may call to check if you do not hear from [place] after three days. 10. OTHER INSTRUCTIONS: _______________________________________________________________________________________________ _____________________________________________________________________________________________________________________________________ _____________________________________________________________________________________________________________________________________ _____________________________________________________________________________________________________________________________________  WHEN TO CALL DR WAKEFIELD: 1. Fever over 101.0 2. Nausea and/or vomiting. 3. Extreme swelling or bruising. 4. Continued bleeding from incision. 5. Increased pain, redness, or drainage from the  incision.  The clinic staff is available to answer your questions during regular business hours.  Please dont hesitate to call and ask to speak  to one of the nurses for clinical concerns.  If you have a medical emergency, go to the nearest emergency room or call 911.  A surgeon from [facility] is always on call at the hospital.  For further questions, please visit [email or web address]    Post Anesthesia Home Care Instructions  Activity: Get plenty of rest for the remainder of the day. A responsible individual must stay with you for 24 hours following the procedure.  For the next 24 hours, DO NOT: -Drive a car -Paediatric nurse -Drink alcoholic beverages -Take any medication unless instructed by your physician -Make any legal decisions or sign important papers.  Meals: Start with liquid foods such as gelatin or soup. Progress to regular foods as tolerated. Avoid greasy, spicy, heavy foods. If nausea and/or vomiting occur, drink only clear liquids until the nausea and/or vomiting subsides. Call your physician if vomiting continues.  Special Instructions/Symptoms: Your throat may feel dry or sore from the anesthesia or the breathing tube placed in your throat during surgery. If this causes discomfort, gargle with warm salt water. The discomfort should disappear within 24 hours.  If you had a scopolamine patch placed behind your ear for the management of post- operative nausea and/or vomiting:  1. The medication in the patch is effective for 72 hours, after which it should be removed.  Wrap patch in a tissue and discard in the trash. Wash hands thoroughly with soap and water. 2. You may remove the patch earlier than 72 hours if you experience unpleasant side effects which may include dry mouth, dizziness or visual disturbances. 3. Avoid touching the patch. Wash your hands with soap and water after contact with the patch.

## 2018-02-25 NOTE — Interval H&P Note (Signed)
History and Physical Interval Note:  02/25/2018 9:07 AM  [name]  has presented today for surgery, with the diagnosis of LEFT BREAST MASS  The various methods of treatment have been discussed with the patient and family. After consideration of risks, benefits and other options for treatment, the patient has consented to  Procedure(s): RADIOACTIVE SEED GUIDED EXCISIONAL BREAST BIOPSY (Left) as a surgical intervention .  The patient's history has been reviewed, patient examined, no change in status, stable for surgery.  I have reviewed the patient's chart and labs.  Questions were answered to the patient's satisfaction.     Rolm Bookbinder

## 2018-02-25 NOTE — Anesthesia Preprocedure Evaluation (Signed)
Anesthesia Evaluation  Patient identified by MRN, date of birth, ID band Patient awake    Reviewed: Allergy & Precautions, H&P , NPO status , Patient's Chart, lab work & pertinent test results  Airway Mallampati: II   Neck ROM: full    Dental   Pulmonary shortness of breath,    breath sounds clear to auscultation       Cardiovascular hypertension,  Rhythm:regular Rate:Normal     Neuro/Psych  Headaches, PSYCHIATRIC DISORDERS Depression  Neuromuscular disease    GI/Hepatic GERD  ,  Endo/Other  Hypothyroidism   Renal/GU      Musculoskeletal   Abdominal   Peds  Hematology   Anesthesia Other Findings   Reproductive/Obstetrics                             Anesthesia Physical Anesthesia Plan  ASA: II  Anesthesia Plan: General   Post-op Pain Management:    Induction: Intravenous  PONV Risk Score and Plan: 3 and Ondansetron, Dexamethasone, Midazolam and Treatment may vary due to age or medical condition  Airway Management Planned: LMA  Additional Equipment:   Intra-op Plan:   Post-operative Plan:   Informed Consent: I have reviewed the patients History and Physical, chart, labs and discussed the procedure including the risks, benefits and alternatives for the proposed anesthesia with the patient or authorized representative who has indicated his/her understanding and acceptance.     Plan Discussed with: CRNA, Anesthesiologist and Surgeon  Anesthesia Plan Comments:         Anesthesia Quick Evaluation

## 2018-02-25 NOTE — Anesthesia Procedure Notes (Signed)
Procedure Name: LMA Insertion Date/Time: 02/25/2018 9:22 AM Performed by: Maryella Shivers, CRNA Pre-anesthesia Checklist: Patient identified, Emergency Drugs available, Suction available and Patient being monitored Patient Re-evaluated:Patient Re-evaluated prior to induction Oxygen Delivery Method: Circle system utilized Preoxygenation: Pre-oxygenation with 100% oxygen Induction Type: IV induction Ventilation: Mask ventilation without difficulty LMA: LMA inserted LMA Size: 4.0 Number of attempts: 1 Airway Equipment and Method: Bite block Placement Confirmation: positive ETCO2 Tube secured with: Tape Dental Injury: Teeth and Oropharynx as per pre-operative assessment

## 2018-02-26 ENCOUNTER — Encounter (HOSPITAL_BASED_OUTPATIENT_CLINIC_OR_DEPARTMENT_OTHER): Payer: Self-pay | Admitting: General Surgery

## 2018-03-07 ENCOUNTER — Telehealth: Payer: Self-pay

## 2018-03-07 NOTE — Telephone Encounter (Signed)
Left message for patient to return call to office. 

## 2018-03-07 NOTE — Telephone Encounter (Signed)
-----   Message from Tarry Kos sent at 03/07/2018  9:28 AM EDT ----- Regarding: UTI symptoms Gina Wise called, she has just finished an amoxicillin rx that Dr. Linard Millers gave her.  She states she has burning and frequency when urinating and was requesting UTI treatment.  She uses Writer at [facility].  She is not able to come in today to leave a sample.

## 2018-03-11 DIAGNOSIS — R32 Unspecified urinary incontinence: Secondary | ICD-10-CM | POA: Diagnosis not present

## 2018-03-11 DIAGNOSIS — N8110 Cystocele, unspecified: Secondary | ICD-10-CM | POA: Diagnosis not present

## 2018-03-11 DIAGNOSIS — K469 Unspecified abdominal hernia without obstruction or gangrene: Secondary | ICD-10-CM | POA: Diagnosis not present

## 2018-03-11 DIAGNOSIS — N393 Stress incontinence (female) (male): Secondary | ICD-10-CM | POA: Diagnosis not present

## 2018-03-24 ENCOUNTER — Telehealth: Payer: Self-pay | Admitting: Family Medicine

## 2018-03-24 NOTE — Telephone Encounter (Signed)
Spoke with pt and informed we did not have the paperwork. Pt is emailing it to [place]. TLG

## 2018-03-24 NOTE — Telephone Encounter (Signed)
Copied from [place] [id number]. Topic: Inquiry >> Mar 24, 2018 11:31 AM Gina Wise wrote: Reason for CRM: pt states that she faxed in [facility] paperwork Thursday 03/20/18 and Friday 03/21/18 to [phone]. She said the due date was 03/23/18. Pt notified FMLA dept that provider was out of the office and they extended to today. Pt would like call to notify that it was received and notify if it can be sent back today. Please call back.

## 2018-05-29 ENCOUNTER — Ambulatory Visit: Payer: 59 | Admitting: Family Medicine

## 2018-05-29 ENCOUNTER — Encounter: Payer: Self-pay | Admitting: Family Medicine

## 2018-05-29 VITALS — BP 132/88 | HR 97 | Temp 98.3°F | Ht 66.0 in | Wt 189.6 lb

## 2018-05-29 DIAGNOSIS — R51 Headache: Secondary | ICD-10-CM

## 2018-05-29 DIAGNOSIS — G4459 Other complicated headache syndrome: Secondary | ICD-10-CM

## 2018-05-29 DIAGNOSIS — E860 Dehydration: Secondary | ICD-10-CM | POA: Diagnosis not present

## 2018-05-29 DIAGNOSIS — K529 Noninfective gastroenteritis and colitis, unspecified: Secondary | ICD-10-CM | POA: Insufficient documentation

## 2018-05-29 DIAGNOSIS — R519 Headache, unspecified: Secondary | ICD-10-CM | POA: Insufficient documentation

## 2018-05-29 LAB — CBC
HCT: 41.4 % (ref 36.0–46.0)
HEMOGLOBIN: 14 g/dL (ref 12.0–15.0)
MCHC: 33.9 g/dL (ref 30.0–36.0)
MCV: 87 fl (ref 78.0–100.0)
PLATELETS: 310 10*3/uL (ref 150.0–400.0)
RBC: 4.76 Mil/uL (ref 3.87–5.11)
RDW: 13.8 % (ref 11.5–15.5)
WBC: 6.2 10*3/uL (ref 4.0–10.5)

## 2018-05-29 LAB — COMPREHENSIVE METABOLIC PANEL
ALK PHOS: 68 U/L (ref 39–117)
ALT: 13 U/L (ref 0–35)
AST: 14 U/L (ref 0–37)
Albumin: 4.4 g/dL (ref 3.5–5.2)
BUN: 11 mg/dL (ref 6–23)
CALCIUM: 9.6 mg/dL (ref 8.4–10.5)
CO2: 32 mEq/L (ref 19–32)
Chloride: 104 mEq/L (ref 96–112)
Creatinine, Ser: 0.79 mg/dL (ref 0.40–1.20)
GFR: 79.94 mL/min (ref >60.00)
GLUCOSE: 95 mg/dL (ref 70–99)
Potassium: 3.7 mEq/L (ref 3.5–5.1)
Sodium: 143 mEq/L (ref 135–145)
TOTAL PROTEIN: 7.4 g/dL (ref 6.0–8.3)
Total Bilirubin: 0.5 mg/dL (ref 0.2–1.2)

## 2018-05-29 MED ORDER — AMOXICILLIN-POT CLAVULANATE 875-125 MG PO TABS: 1.0000 | ORAL_TABLET | Freq: Two times a day (BID) | ORAL | 0 refills | Status: AC

## 2018-05-29 MED ORDER — KETOROLAC TROMETHAMINE 60 MG/2ML IM SOLN
60.0000 mg | Freq: Once | INTRAMUSCULAR | Status: AC
  Administered 2018-05-29: 60 mg via INTRAMUSCULAR

## 2018-05-29 NOTE — Assessment & Plan Note (Signed)
New- in patient with h/o diverticulitis.  She states symptoms she is currently having is consistent with her previous flares (last colonoscopy 09/2017). Non surgical abdomen- no rebound or guarding which is reassuring. Treat with Augmentin twice daily x 10 days. Call or return to clinic prn if these symptoms worsen or fail to improve as anticipated. The patient indicates understanding of these issues and agrees with the plan.

## 2018-05-29 NOTE — Assessment & Plan Note (Signed)
New-  Does not seem migrainous, ? Due to heat exhaustion/dehydration. Advised pushing fluids with electrolytes, cool wash clothes. IM toradol given in office- she did seem to get some relief from this prior to leaving office.

## 2018-05-29 NOTE — Assessment & Plan Note (Addendum)
New- likely due to dehydration from heat exhaustion and colitis. Push fluids with electrolytes. Labs today. Orders Placed This Encounter  Procedures  . Comprehensive metabolic panel  . CBC   The patient indicates understanding of these issues and agrees with the plan.

## 2018-05-29 NOTE — Addendum Note (Signed)
Addended by: Marrion Coy on: 05/29/2018 12:21 PM   Modules accepted: Orders

## 2018-05-29 NOTE — Patient Instructions (Signed)
Great to see you.  Please take Augmentin as directed- 1 tablet twice daily x 10 days.  Push fluids.

## 2018-05-29 NOTE — Progress Notes (Signed)
Subjective:   Patient ID: Gina Wise, female    DOB: April 27, 1961, 57 y.o.   MRN: [id number]  Gina Wise is a pleasant 57 y.o. year old female who presents to clinic today with Headache (Patient is here today C/O a headache that has not gone away since she was outside working on her car this past weekend.  She feels that the heat has kicked in her colitis.  Throat is so dry it is hard to swallow.  )  on 05/29/2018  HPI:  ? Colitis-  She was outside working on her car on Monday, very hot day.  She developed a HA that evening and then her colitis symptoms started- diarrhea, nausea (typical of her colitis flares).  No blood in stool or abdominal pain. She feels "dry" despite drinking water.  HA does not feel like her migraines typically do. Still has a headache today- frontal, bilateral, pressure. No photophobia.   Current Outpatient Medications on File Prior to Visit  Medication Sig Dispense Refill  . acetaminophen (TYLENOL) 500 MG tablet Take 1 tablet (500 mg total) by mouth every 6 (six) hours as needed. 30 tablet 0  . albuterol (PROVENTIL HFA;VENTOLIN HFA) 108 (90 Base) MCG/ACT inhaler Inhale 1-2 puffs into the lungs every 6 (six) hours as needed. 1 Inhaler 0  . b complex vitamins capsule Take 1 capsule by mouth daily.    . Biotin 1000 MCG tablet Take by mouth.    . cetirizine (ZYRTEC) 10 MG tablet Take 10 mg by mouth daily.    Marland Kitchen dicyclomine (BENTYL) 20 MG tablet Take 1 tablet (20 mg total) by mouth 3 (three) times daily as needed for spasms. 60 tablet 2  . diphenoxylate-atropine (LOMOTIL) 2.5-0.025 MG tablet Take 1 tablet by mouth 3 (three) times daily as needed for diarrhea or loose stools. 60 tablet 1  . esomeprazole (NEXIUM) 40 MG capsule Take 1 capsule (40 mg total) by mouth 2 (two) times daily. (Patient taking differently: Take 40 mg by mouth 2 (two) times daily. DAW) 60 capsule 3  . estradiol (ESTRACE) 0.1 MG/GM vaginal cream Place 1 Applicatorful vaginally at bedtime.    Marland Kitchen  guaiFENesin (MUCINEX) 600 MG 12 hr tablet Take 1 tablet (600 mg total) by mouth 2 (two) times daily as needed for cough or to loosen phlegm. 14 tablet 0  . hydrochlorothiazide (HYDRODIURIL) 25 MG tablet Take 1 tablet (25 mg total) by mouth daily. 90 tablet 1  . hydroxychloroquine (PLAQUENIL) 200 MG tablet Take 200 mg by mouth 2 (two) times daily.     Marland Kitchen levothyroxine (SYNTHROID, LEVOTHROID) 125 MCG tablet Take 1 tablet (125 mcg total) by mouth daily. 90 tablet 3  . sertraline (ZOLOFT) 100 MG tablet Take 0.5 tablets (50 mg total) by mouth daily. 45 tablet 2  . SUMAtriptan (IMITREX) 100 MG tablet TAKE 1 TABLET BY MOUTH  EVERY 2 HOURS AS NEEDED FOR MIGRAINE(S) AS DIRECTED 20 tablet 1  . Vitamin D, Cholecalciferol, 400 UNITS TABS Take 400 Units by mouth daily.    . phentermine 37.5 MG capsule Take 1 capsule (37.5 mg total) by mouth every morning. (Patient not taking: Reported on 05/29/2018) 30 capsule 1  . rosuvastatin (CRESTOR) 5 MG tablet Take 1 tablet (5 mg total) by mouth daily. (Patient not taking: Reported on 05/29/2018) 90 tablet 3   No current facility-administered medications on file prior to visit.     Allergies  Allergen Reactions  . Contrast Media [Iodinated Diagnostic Agents] Hives and Itching  . Other  Hives and Swelling    CATS AND CAT DANDER  . Codeine Nausea Only  . Erythromycin Nausea And Vomiting    Severe abdominal pain  . Iohexol Hives    Past Medical History:  Diagnosis Date  . 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 Allergy   . Anemia   . Bruising 05/10/2015  . CAP (community acquired pneumonia) 08/03/2015  . Chronic bronchitis ([place])    "get it q yr; in the spring time" (02/17/2014)  . Depression    "have taken zoloft since 1996" (02/17/2014)  . Diverticulosis   . Dyspnea    from scar tissue  . Epistaxis 12/01/2015  . Gastritis   . GERD (gastroesophageal reflux disease)   . Hematemesis with nausea 08/01/2016  . Hiatal hernia   . High cholesterol   . Hypertension   .  Hypothyroid   . Internal hemorrhoids   . Migraines    "maybe a couple/yr" (02/17/2014)  . Multiple environmental allergies   . Overweight(278.02)   . Peri-menopause   . Schatzki's ring   . Sjogren's disease ([place])   . Systemic lupus ([place])     Past Surgical History:  Procedure Laterality Date  . APPENDECTOMY  1987  . ESOPHAGOGASTRODUODENOSCOPY N/A 02/28/2014   Procedure: ESOPHAGOGASTRODUODENOSCOPY (EGD);  Surgeon: Jerene Bears, MD;  Location: [facility] ENDOSCOPY;  Service: Endoscopy;  Laterality: N/A;  . ESOPHAGOGASTRODUODENOSCOPY N/A 08/02/2016   Procedure: ESOPHAGOGASTRODUODENOSCOPY (EGD);  Surgeon: Gatha Mayer, MD;  Location[facility] ENDOSCOPY;  Service: Endoscopy;  Laterality: N/A;  . EXPLORATORY LAPAROTOMY  1987  . FOOT SURGERY Right ~1980   "don't know what they did; had to take something out"  . INGUINAL HERNIA REPAIR Right ~ 2003  . RADIOACTIVE SEED GUIDED EXCISIONAL BREAST BIOPSY Left 02/25/2018   Procedure: RADIOACTIVE SEED GUIDED EXCISIONAL BREAST BIOPSY;  Surgeon: Rolm Bookbinder, MD;  Location: [facility];  Service: General;  Laterality: Left;  . SHOULDER OPEN ROTATOR CUFF REPAIR Left 1995  . SHOULDER SURGERY Left 1997   "cartilage"  . TONSILLECTOMY AND ADENOIDECTOMY  1976  . TUBAL LIGATION  1998   laparoscopic  . VAGINAL HYSTERECTOMY  04/08/2012   menorrhagia    Family History  Problem Relation Age of Onset  . Hypertension Father   . Vision loss Father   . Alcohol abuse Father   . Emphysema Father        Smoker  . Hearing loss Father   . Early death Father        Murdered  . Hypertension Mother   . Heart disease Mother   . Hepatitis Mother        Hep C  . Endometriosis Mother   . Pancreatic disease Mother   . Depression Mother   . Hearing loss Mother   . Lupus Sister   . Endometriosis Sister   . Arthritis Sister   . Depression Sister   . Breast cancer Paternal Aunt        27's  . Lung cancer Paternal Uncle        smoker  . Colon cancer  Maternal Grandfather   . Uterine cancer Maternal Aunt        50's  . Emphysema Paternal Aunt        Non-smoker/Died of Pneumonia  . Lung cancer Paternal Uncle   . Heart attack Maternal Uncle   . Parkinson's disease Maternal Grandmother   . Alcohol abuse Maternal Grandmother   . Heart disease Paternal Grandmother  Died during angioplasty surgery  . Aneurysm Paternal Grandfather   . Allergy (severe) Son        ASA-Idiopathic Thrombocytopenia  . Early death Maternal Uncle        Murdered  . Lung disease Paternal Aunt        Bronchiecstasis  . Lung cancer Paternal Uncle   . Early death Paternal Uncle        Carbon Monoxide Poisoning/Accidental  . Stomach cancer Neg Hx   . Pancreatic cancer Neg Hx     Social History   Socioeconomic History  . Marital status: Single    Spouse name: Not on file  . Number of children: Not on file  . Years of education: Not on file  . Highest education level: Not on file  Occupational History  . Not on file  Social Needs  . Financial resource strain: Not on file  . Food insecurity:    Worry: Not on file    Inability: Not on file  . Transportation needs:    Medical: Not on file    Non-medical: Not on file  Tobacco Use  . Smoking status: Never Smoker  . Smokeless tobacco: Never Used  Substance and Sexual Activity  . Alcohol use: Yes    Alcohol/week: 0.0 standard drinks    Comment: socially  . Drug use: No  . Sexual activity: Not on file  Lifestyle  . Physical activity:    Days per week: Not on file    Minutes per session: Not on file  . Stress: Not on file  Relationships  . Social connections:    Talks on phone: Not on file    Gets together: Not on file    Attends religious service: Not on file    Active member of club or organization: Not on file    Attends meetings of clubs or organizations: Not on file    Relationship status: Not on file  . Intimate partner violence:    Fear of current or ex partner: Not on file     Emotionally abused: Not on file    Physically abused: Not on file    Forced sexual activity: Not on file  Other Topics Concern  . Not on file  Social History Narrative   Works at urgent care.    The PMH, PSH, Social History, Family History, Medications, and allergies have been reviewed in [facility], and have been updated if relevant.   Review of Systems  Constitutional: Negative.   HENT: Negative.   Gastrointestinal: Positive for diarrhea and nausea. Negative for abdominal distention, abdominal pain, anal bleeding, blood in stool, constipation, rectal pain and vomiting.  Genitourinary: Negative.   Musculoskeletal: Negative.   Neurological: Positive for headaches. Negative for dizziness, tremors, seizures, syncope, facial asymmetry, speech difficulty, weakness, light-headedness and numbness.  Hematological: Negative.   Psychiatric/Behavioral: Negative.   All other systems reviewed and are negative.      Objective:    BP 132/88 (BP Location: Left Arm, Patient Position: Sitting, Cuff Size: Normal)   Pulse 97   Temp 98.3 F (36.8 C) (Oral)   Ht 5\' 6"  (1.676 m)   Wt 189 lb 9.6 oz (86 kg)   LMP 04/03/2012 Comment: Still has Cervix and Ovaries  SpO2 98%   BMI 30.60 kg/m    Physical Exam  Constitutional: She is oriented to person, place, and time. She appears well-developed and well-nourished.  Non-toxic appearance. She appears ill.  HENT:  Head: Normocephalic and atraumatic.  Eyes:  EOM are normal.  Cardiovascular: Normal rate.  Pulmonary/Chest: Effort normal.  Abdominal: Soft. Bowel sounds are normal. She exhibits no distension. There is no tenderness.  Musculoskeletal: Normal range of motion.  Neurological: She is alert and oriented to person, place, and time. She has normal strength.  Skin: Skin is warm and dry.  Psychiatric: She has a normal mood and affect.  Nursing note and vitals reviewed.         Assessment & Plan:   No diagnosis found. No follow-ups on file.

## 2018-06-02 ENCOUNTER — Telehealth: Payer: Self-pay

## 2018-06-02 ENCOUNTER — Encounter: Payer: Self-pay | Admitting: Family Medicine

## 2018-06-02 ENCOUNTER — Telehealth: Payer: Self-pay | Admitting: Family Medicine

## 2018-06-02 NOTE — Telephone Encounter (Signed)
Pt called in with answers to Dr. Hulen Shouts questions:  1.   She is still taking the Augmentin twice daily.  2.  No fever.  3.  Symptoms:   She felt great this morning but starting about 8:30am the headache returned, feels nauseas, shaky and lightheaded.   Does not feel well now.  4.  Hydration:  She has had 4 16.9 ounce bottles of water today.   She has Sjogren's Syndrome so the hydration test is not reliable for her.   "My skin stays tented".   She asked how long does it take to get over heat exhaustion?  I routed these answers to Dr. Deborra Medina.

## 2018-06-02 NOTE — Telephone Encounter (Signed)
PEC-I LMOVM for pt stating that Wilfred Lacy, NP advised in Dr. Hulen Shouts absence that she go to either an Urgent Care that can provide IV fluids or the ED to get IV fluids/asked pt to call back to advise that she did get this message and what she plans on doing/thx dmf

## 2018-06-02 NOTE — Telephone Encounter (Signed)
CN-Can you look at the details that were gathered per my request this morning/Pt basically suffered from heat exhaustion and came to see Dr. Deborra Medina last week/is asking how long does it usually take to recover from heat exhaustion/pt has Sjogren's and is being treated with Augmentin BID for colitis/at visit she was given a Toradol 60mg  injection for her severe headache/the headache has returned, feels nauseated, shaky and lightheaded/plz advise/thx dmf

## 2018-06-02 NOTE — Telephone Encounter (Signed)
PEC-I LMOVM for pt to RTC:  1.) Is she still taking Augmentin twice daily?  2.) Is she running a fever?  3.) Is she having the same symptoms or new symptoms and have they worsened or changed?  4.) Is she hydrated? When she has her hand sitting on a table; with the other hand pull skin up and see how long it takes for the skin to go back flat?  If she calls back please have her answer these questions/thx dmf

## 2018-06-02 NOTE — Telephone Encounter (Signed)
Copied from [place] [id number]. Topic: General - Other >> Jun 02, 2018 10:48 AM Yvette Rack wrote: Reason for CRM: Pt states he has a few questions regarding her last visit because she is experiencing some of the same symptoms but can not afford to come in to the office. Pt requests call back. Cb# [phone]

## 2018-06-02 NOTE — Telephone Encounter (Signed)
She needs to go to urgent care clinic or ED for IV fluids.

## 2018-06-04 LAB — LIPID PANEL
CHOLESTEROL: 234 — AB (ref 0–200)
HDL: 49 (ref 35–70)
LDL CALC: 147
LDL/HDL RATIO: 4.8
Triglycerides: 188 — AB (ref 40–160)

## 2018-06-04 LAB — BASIC METABOLIC PANEL
CREATININE: 0.8 (ref 0.5–1.1)
Glucose: 111

## 2018-06-04 LAB — HEMOGLOBIN A1C: Hemoglobin A1C: 5.4 % (ref 4.0–5.6)

## 2018-06-19 ENCOUNTER — Encounter: Payer: Self-pay | Admitting: Family Medicine

## 2018-06-19 NOTE — Progress Notes (Signed)
Screening/thx dmf

## 2018-06-20 ENCOUNTER — Other Ambulatory Visit: Payer: Self-pay

## 2018-06-20 MED ORDER — SERTRALINE HCL 100 MG PO TABS: 50.0000 mg | ORAL_TABLET | Freq: Every day | ORAL | 2 refills | Status: DC

## 2018-06-24 ENCOUNTER — Other Ambulatory Visit: Payer: Self-pay | Admitting: Family Medicine

## 2018-06-25 ENCOUNTER — Other Ambulatory Visit: Payer: Self-pay | Admitting: Behavioral Health

## 2018-06-25 ENCOUNTER — Other Ambulatory Visit: Payer: Self-pay

## 2018-06-25 MED ORDER — SERTRALINE HCL 100 MG PO TABS: 50.0000 mg | ORAL_TABLET | Freq: Every day | ORAL | 2 refills | Status: DC

## 2018-07-23 DIAGNOSIS — N393 Stress incontinence (female) (male): Secondary | ICD-10-CM | POA: Diagnosis not present

## 2018-07-23 DIAGNOSIS — N952 Postmenopausal atrophic vaginitis: Secondary | ICD-10-CM | POA: Diagnosis not present

## 2018-07-23 DIAGNOSIS — K469 Unspecified abdominal hernia without obstruction or gangrene: Secondary | ICD-10-CM | POA: Insufficient documentation

## 2018-07-23 DIAGNOSIS — N819 Female genital prolapse, unspecified: Secondary | ICD-10-CM | POA: Diagnosis not present

## 2018-08-04 DIAGNOSIS — N813 Complete uterovaginal prolapse: Secondary | ICD-10-CM | POA: Diagnosis not present

## 2018-08-04 DIAGNOSIS — N816 Rectocele: Secondary | ICD-10-CM | POA: Diagnosis not present

## 2018-08-04 DIAGNOSIS — N8110 Cystocele, unspecified: Secondary | ICD-10-CM | POA: Diagnosis not present

## 2018-08-04 DIAGNOSIS — N812 Incomplete uterovaginal prolapse: Secondary | ICD-10-CM | POA: Diagnosis not present

## 2018-08-04 HISTORY — PX: VAGINAL PROLAPSE REPAIR: SHX830

## 2018-08-13 ENCOUNTER — Encounter: Payer: Self-pay | Admitting: Family Medicine

## 2018-08-13 ENCOUNTER — Telehealth: Payer: Self-pay

## 2018-08-13 NOTE — Telephone Encounter (Signed)
Copied from [place] [phone]. Topic: General - Other >> Aug 13, 2018  4:01 PM Yvette Rack wrote: Reason for CRM: pt calling wanting to know if the practice has received a fax from [employer] screening its about an appeal that due on the 25th if you could please give her a call back at [phone]

## 2018-08-14 NOTE — Telephone Encounter (Signed)
See mychart message - already handled for patient.

## 2018-08-27 DIAGNOSIS — N9489 Other specified conditions associated with female genital organs and menstrual cycle: Secondary | ICD-10-CM | POA: Diagnosis not present

## 2018-08-27 DIAGNOSIS — R8290 Unspecified abnormal findings in urine: Secondary | ICD-10-CM | POA: Diagnosis not present

## 2018-09-04 ENCOUNTER — Other Ambulatory Visit: Payer: Self-pay | Admitting: Family Medicine

## 2018-09-17 DIAGNOSIS — N393 Stress incontinence (female) (male): Secondary | ICD-10-CM | POA: Diagnosis not present

## 2018-10-23 ENCOUNTER — Telehealth: Payer: Self-pay

## 2018-10-23 NOTE — Telephone Encounter (Signed)
Copied from [place] [id number] [id number]. Topic: General - Other >> Oct 23, 2018  1:51 PM Berneta Levins wrote: Reason for CRM:   Pt states that she faxed in and emailed FMLA paperwork on 10/21/18.  Pt wants to make sure that was received.  Pt states that it needs to be completed by 10/27/2018. Pt can be reached at [phone] or [phone].

## 2018-10-27 NOTE — Telephone Encounter (Signed)
Paperwork is complete/faxed/emailed to pt secure work email/thx dmf

## 2018-12-22 DIAGNOSIS — J209 Acute bronchitis, unspecified: Secondary | ICD-10-CM | POA: Diagnosis not present

## 2018-12-22 DIAGNOSIS — J069 Acute upper respiratory infection, unspecified: Secondary | ICD-10-CM | POA: Diagnosis not present

## 2018-12-24 DIAGNOSIS — R05 Cough: Secondary | ICD-10-CM | POA: Diagnosis not present

## 2018-12-24 DIAGNOSIS — J069 Acute upper respiratory infection, unspecified: Secondary | ICD-10-CM | POA: Diagnosis not present

## 2018-12-24 DIAGNOSIS — R079 Chest pain, unspecified: Secondary | ICD-10-CM | POA: Diagnosis not present

## 2018-12-26 ENCOUNTER — Ambulatory Visit: Payer: 59 | Admitting: Family Medicine

## 2018-12-26 ENCOUNTER — Other Ambulatory Visit: Payer: Self-pay

## 2018-12-26 ENCOUNTER — Encounter: Payer: Self-pay | Admitting: Family Medicine

## 2018-12-26 VITALS — BP 138/86 | HR 95 | Temp 98.6°F | Ht 66.0 in | Wt 185.4 lb

## 2018-12-26 DIAGNOSIS — J0100 Acute maxillary sinusitis, unspecified: Secondary | ICD-10-CM | POA: Insufficient documentation

## 2018-12-26 MED ORDER — HYDROCHLOROTHIAZIDE 25 MG PO TABS: 25.0000 mg | ORAL_TABLET | Freq: Every day | ORAL | 1 refills | Status: DC

## 2018-12-26 MED ORDER — LEVOTHYROXINE SODIUM 125 MCG PO TABS: 125.0000 ug | ORAL_TABLET | Freq: Every day | ORAL | 1 refills | Status: DC

## 2018-12-26 MED ORDER — DOXYCYCLINE HYCLATE 100 MG PO TABS
100.0000 mg | ORAL_TABLET | Freq: Two times a day (BID) | ORAL | 0 refills | Status: AC
Start: 2018-12-26 — End: 2019-01-05

## 2018-12-26 NOTE — Progress Notes (Signed)
Established Patient Office Visit  Subjective:  Patient ID: Gina Wise, female    DOB: 12/28/1960  Age: 58 y.o. MRN: [id number]  CC:  Chief Complaint  Patient presents with  . Sinusitis    Patient is here today C/O a possible sinus infection.  She has periorbital, frontal, and maxillary sinus pressure.  When she bites down her ears pop. Cough is non productive and feels irritated.  Seen at [facility] on Wed and offered Tamiflu but due to coloitis and SE decided against it.  CXR and Labs were completed and WNL.      HPI Gina Wise presents for treatment and evaluation of a 5 to 6-day history of URI symptoms that now include facial pressure teeth pain yellow to clear rhinorrhea and postnasal drip.  There is some cough without reactive airway disease.  Patient has been afebrile.  Denies myalgias and arthralgias.  She has tried multiple over-the-counter medications without relief.  Patient has been experiencing 1 upper respiratory tract infection after another over the last 6 weeks.  Seen in the emergency room a few days ago.  Chest x-ray taken at that time was clear.  Tested negative for flu was offered Tamiflu and declined.  Past Medical History:  Diagnosis Date  . 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 Allergy   . Anemia   . Bruising 05/10/2015  . CAP (community acquired pneumonia) 08/03/2015  . Chronic bronchitis ([place])    "get it q yr; in the spring time" (02/17/2014)  . Depression    "have taken zoloft since 1996" (02/17/2014)  . Diverticulosis   . Dyspnea    from scar tissue  . Epistaxis 12/01/2015  . Gastritis   . GERD (gastroesophageal reflux disease)   . Hematemesis with nausea 08/01/2016  . Hiatal hernia   . High cholesterol   . Hypertension   . Hypothyroid   . Internal hemorrhoids   . Migraines    "maybe a couple/yr" (02/17/2014)  . Multiple environmental allergies   . Overweight(278.02)   . Peri-menopause   . Schatzki's ring   . Sjogren's disease ([place])   . Systemic lupus  ([place])     Past Surgical History:  Procedure Laterality Date  . APPENDECTOMY  1987  . ESOPHAGOGASTRODUODENOSCOPY N/A 02/28/2014   Procedure: ESOPHAGOGASTRODUODENOSCOPY (EGD);  Surgeon: Jerene Bears, MD;  Location: [facility] ENDOSCOPY;  Service: Endoscopy;  Laterality: N/A;  . ESOPHAGOGASTRODUODENOSCOPY N/A 08/02/2016   Procedure: ESOPHAGOGASTRODUODENOSCOPY (EGD);  Surgeon: Gatha Mayer, MD;  Location: [facility] ENDOSCOPY;  Service: Endoscopy;  Laterality: N/A;  . EXPLORATORY LAPAROTOMY  1987  . FOOT SURGERY Right ~1980   "don't know what they did; had to take something out"  . INGUINAL HERNIA REPAIR Right ~ 2003  . RADIOACTIVE SEED GUIDED EXCISIONAL BREAST BIOPSY Left 02/25/2018   Procedure: RADIOACTIVE SEED GUIDED EXCISIONAL BREAST BIOPSY;  Surgeon: Rolm Bookbinder, MD;  Location: [facility];  Service: General;  Laterality: Left;  . SHOULDER OPEN ROTATOR CUFF REPAIR Left 1995  . SHOULDER SURGERY Left 1997   "cartilage"  . TONSILLECTOMY AND ADENOIDECTOMY  1976  . TUBAL LIGATION  1998   laparoscopic  . VAGINAL HYSTERECTOMY  04/08/2012   menorrhagia  . VAGINAL PROLAPSE REPAIR  08/04/2018   Anterior and posterior with colp-mesh-Dr. Mikle Bosworth @ [facility]    Family History  Problem Relation Age of Onset  . Hypertension Father   . Vision loss Father   . Alcohol abuse Father   . Emphysema Father  Smoker  . Hearing loss Father   . Early death Father        Murdered  . Hypertension Mother   . Heart disease Mother   . Hepatitis Mother        Hep C  . Endometriosis Mother   . Pancreatic disease Mother   . Depression Mother   . Hearing loss Mother   . Lupus Sister   . Endometriosis Sister   . Arthritis Sister   . Depression Sister   . Breast cancer Paternal Aunt        57's  . Lung cancer Paternal Uncle        smoker  . Colon cancer Maternal Grandfather   . Uterine cancer Maternal Aunt        50's  . Emphysema Paternal Aunt        Non-smoker/Died of Pneumonia   . Lung cancer Paternal Uncle   . Heart attack Maternal Uncle   . Parkinson's disease Maternal Grandmother   . Alcohol abuse Maternal Grandmother   . Heart disease Paternal Grandmother        Died during angioplasty surgery  . Aneurysm Paternal Grandfather   . Allergy (severe) Son        ASA-Idiopathic Thrombocytopenia  . Early death Maternal Uncle        Murdered  . Lung disease Paternal Aunt        Bronchiecstasis  . Lung cancer Paternal Uncle   . Early death Paternal Uncle        Carbon Monoxide Poisoning/Accidental  . Stomach cancer Neg Hx   . Pancreatic cancer Neg Hx     Social History   Socioeconomic History  . Marital status: Single    Spouse name: Not on file  . Number of children: Not on file  . Years of education: Not on file  . Highest education level: Not on file  Occupational History  . Not on file  Social Needs  . Financial resource strain: Not on file  . Food insecurity:    Worry: Not on file    Inability: Not on file  . Transportation needs:    Medical: Not on file    Non-medical: Not on file  Tobacco Use  . Smoking status: Never Smoker  . Smokeless tobacco: Never Used  Substance and Sexual Activity  . Alcohol use: Yes    Alcohol/week: 0.0 standard drinks    Comment: socially  . Drug use: No  . Sexual activity: Not on file  Lifestyle  . Physical activity:    Days per week: Not on file    Minutes per session: Not on file  . Stress: Not on file  Relationships  . Social connections:    Talks on phone: Not on file    Gets together: Not on file    Attends religious service: Not on file    Active member of club or organization: Not on file    Attends meetings of clubs or organizations: Not on file    Relationship status: Not on file  . Intimate partner violence:    Fear of current or ex partner: Not on file    Emotionally abused: Not on file    Physically abused: Not on file    Forced sexual activity: Not on file  Other Topics Concern  .  Not on file  Social History Narrative   Works at urgent care.     Outpatient Medications Prior to Visit  Medication Sig Dispense  Refill  . acetaminophen (TYLENOL) 500 MG tablet Take 1 tablet (500 mg total) by mouth every 6 (six) hours as needed. 30 tablet 0  . b complex vitamins capsule Take 1 capsule by mouth daily.    . Biotin 1000 MCG tablet Take by mouth.    . cetirizine (ZYRTEC) 10 MG tablet Take 10 mg by mouth daily.    Marland Kitchen dicyclomine (BENTYL) 20 MG tablet Take 1 tablet (20 mg total) by mouth 3 (three) times daily as needed for spasms. 60 tablet 2  . diphenoxylate-atropine (LOMOTIL) 2.5-0.025 MG tablet Take 1 tablet by mouth 3 (three) times daily as needed for diarrhea or loose stools. 60 tablet 1  . esomeprazole (NEXIUM) 40 MG capsule Take 1 capsule (40 mg total) by mouth 2 (two) times daily. (Patient taking differently: Take 40 mg by mouth 2 (two) times daily. DAW) 60 capsule 3  . estradiol (ESTRACE) 0.1 MG/GM vaginal cream Place 1 Applicatorful vaginally at bedtime.    Marland Kitchen guaiFENesin (MUCINEX) 600 MG 12 hr tablet Take 1 tablet (600 mg total) by mouth 2 (two) times daily as needed for cough or to loosen phlegm. 14 tablet 0  . hydrochlorothiazide (HYDRODIURIL) 25 MG tablet Take 1 tablet (25 mg total) by mouth daily. 90 tablet 1  . levothyroxine (SYNTHROID, LEVOTHROID) 125 MCG tablet Take 1 tablet (125 mcg total) by mouth daily. 90 tablet 3  . rosuvastatin (CRESTOR) 5 MG tablet Take 1 tablet (5 mg total) by mouth daily. 90 tablet 3  . sertraline (ZOLOFT) 100 MG tablet Take 0.5 tablets (50 mg total) by mouth daily. 45 tablet 2  . SUMAtriptan (IMITREX) 100 MG tablet TAKE 1 TABLET BY MOUTH  EVERY 2 HOURS AS NEEDED FOR MIGRAINE(S) AS DIRECTED 20 tablet 1  . Vitamin D, Cholecalciferol, 400 UNITS TABS Take 400 Units by mouth daily.    Marland Kitchen albuterol (PROVENTIL HFA;VENTOLIN HFA) 108 (90 Base) MCG/ACT inhaler Inhale 1-2 puffs into the lungs every 6 (six) hours as needed. 1 Inhaler 0  .  hydroxychloroquine (PLAQUENIL) 200 MG tablet Take 200 mg by mouth 2 (two) times daily.     . phentermine 37.5 MG capsule Take 1 capsule (37.5 mg total) by mouth every morning. (Patient not taking: Reported on 05/29/2018) 30 capsule 1   No facility-administered medications prior to visit.     Allergies  Allergen Reactions  . Contrast Media [Iodinated Diagnostic Agents] Hives and Itching  . Other Hives and Swelling    CATS AND CAT DANDER  . Codeine Nausea Only  . Erythromycin Nausea And Vomiting    Severe abdominal pain  . Iohexol Hives    ROS Review of Systems  Constitutional: Negative for chills, diaphoresis, fatigue, fever and unexpected weight change.  HENT: Positive for congestion, postnasal drip, rhinorrhea, sinus pressure and sinus pain. Negative for sore throat.   Eyes: Negative for photophobia and visual disturbance.  Respiratory: Positive for cough. Negative for shortness of breath, wheezing and stridor.   Cardiovascular: Negative.   Gastrointestinal: Negative for nausea and vomiting.  Endocrine: Negative for polyphagia and polyuria.  Genitourinary: Negative.   Musculoskeletal: Negative for arthralgias and myalgias.  Skin: Negative for pallor and rash.  Allergic/Immunologic: Negative for immunocompromised state.  Neurological: Positive for headaches. Negative for light-headedness.  Hematological: Does not bruise/bleed easily.  Psychiatric/Behavioral: Negative.       Objective:    Physical Exam  Constitutional: She is oriented to person, place, and time. She appears well-developed and well-nourished. No distress.  HENT:  Head:  Normocephalic and atraumatic.  Right Ear: External ear normal.  Left Ear: External ear normal.  Mouth/Throat: Oropharynx is clear and moist. No oropharyngeal exudate.  Eyes: Pupils are equal, round, and reactive to light. Conjunctivae are normal. Right eye exhibits no discharge. Left eye exhibits no discharge. No scleral icterus.  Neck: Neck  supple. No JVD present. No tracheal deviation present. No thyromegaly present.  Cardiovascular: Normal rate, regular rhythm and normal heart sounds.  Pulmonary/Chest: Effort normal. No stridor.  Abdominal: Bowel sounds are normal.  Lymphadenopathy:    She has no cervical adenopathy.  Neurological: She is alert and oriented to person, place, and time.  Skin: Skin is warm and dry. She is not diaphoretic.  Psychiatric: She has a normal mood and affect. Her behavior is normal.    BP 138/86 (BP Location: Right Arm, Patient Position: Sitting, Cuff Size: Normal)   Pulse 95   Temp 98.6 F (37 C) (Oral)   Ht 5\' 6"  (1.676 m)   Wt 185 lb 6.4 oz (84.1 kg)   LMP 04/03/2012 Comment: Still has Cervix and Ovaries  SpO2 97%   BMI 29.92 kg/m  Wt Readings from Last 3 Encounters:  12/26/18 185 lb 6.4 oz (84.1 kg)  05/29/18 189 lb 9.6 oz (86 kg)  02/25/18 191 lb (86.6 kg)     There are no preventive care reminders to display for this patient.  There are no preventive care reminders to display for this patient.  Lab Results  Component Value Date   TSH 3.810 12/11/2017   Lab Results  Component Value Date   WBC 6.2 05/29/2018   HGB 14.0 05/29/2018   HCT 41.4 05/29/2018   MCV 87.0 05/29/2018   PLT 310.0 05/29/2018   Lab Results  Component Value Date   NA 143 05/29/2018   K 3.7 05/29/2018   CO2 32 05/29/2018   GLUCOSE 95 05/29/2018   BUN 11 05/29/2018   CREATININE 0.8 06/04/2018   BILITOT 0.5 05/29/2018   ALKPHOS 68 05/29/2018   AST 14 05/29/2018   ALT 13 05/29/2018   PROT 7.4 05/29/2018   ALBUMIN 4.4 05/29/2018   CALCIUM 9.6 05/29/2018   ANIONGAP 14 02/21/2018   GFR 79.94 05/29/2018   Lab Results  Component Value Date   CHOL 234 (A) 06/04/2018   Lab Results  Component Value Date   HDL 49 06/04/2018   Lab Results  Component Value Date   LDLCALC 147 06/04/2018   Lab Results  Component Value Date   TRIG 188 (A) 06/04/2018   Lab Results  Component Value Date    CHOLHDL 3.1 02/04/2018   Lab Results  Component Value Date   HGBA1C 5.4 06/04/2018      Assessment & Plan:   Problem List Items Addressed This Visit      Respiratory   Acute maxillary sinusitis - Primary   Relevant Medications   doxycycline (VIBRA-TABS) 100 MG tablet      Meds ordered this encounter  Medications  . doxycycline (VIBRA-TABS) 100 MG tablet    Sig: Take 1 tablet (100 mg total) by mouth 2 (two) times daily for 10 days.    Dispense:  20 tablet    Refill:  0    Follow-up: No follow-ups on file.  Patient advised to use Mucinex as needed to promote drainage out of her sinuses.  Discussed sun exposure warnings for doxycycline.  She will follow-up if she does not improve over the next several days.  Libby Maw, MD

## 2019-02-13 ENCOUNTER — Other Ambulatory Visit: Payer: Self-pay | Admitting: Internal Medicine

## 2019-02-16 ENCOUNTER — Telehealth: Payer: Self-pay

## 2019-02-16 MED ORDER — ESOMEPRAZOLE MAGNESIUM 40 MG PO CPDR: 40.0000 mg | DELAYED_RELEASE_CAPSULE | Freq: Two times a day (BID) | ORAL | 0 refills | Status: DC

## 2019-02-16 MED ORDER — DICYCLOMINE HCL 20 MG PO TABS: 20.0000 mg | ORAL_TABLET | Freq: Three times a day (TID) | ORAL | 0 refills | Status: DC | PRN

## 2019-02-16 NOTE — Telephone Encounter (Signed)
90 day rx sent

## 2019-02-25 ENCOUNTER — Telehealth: Payer: Self-pay | Admitting: *Deleted

## 2019-02-25 NOTE — Telephone Encounter (Signed)
We have gotten faxed note from [facility] indicating that patient has been approved for esomeprazole 40 mg capsules twice daily through 02/24/20. Case # [id number].

## 2019-02-26 ENCOUNTER — Telehealth: Payer: Self-pay | Admitting: Family Medicine

## 2019-02-26 NOTE — Telephone Encounter (Signed)
Called and talked with patient. Calling to schedule virtual visit with Dr. Deborra Medina. Patient states that she is doing well and that she does not need a virtual visit at the time. She will call back to schedule.

## 2019-03-11 ENCOUNTER — Ambulatory Visit: Payer: Self-pay

## 2019-03-11 NOTE — Telephone Encounter (Signed)
Incoming call  From Patient who report a dry cough was wheezing chest and back hurts headache.  Ulcer on tonge Congested, sore throat and runny nose.  States feels worse today than yesterday.  Reviewed protocol and provided  Care advice. Reason for Disposition . MILD difficulty breathing (e.g., minimal/no SOB at rest, SOB with walking, pulse <100)  Answer Assessment - Initial Assessment Questions 1. COVID-19 DIAGNOSIS: "Who made your Coronavirus (COVID-19) diagnosis?" "Was it confirmed by a positive lab test?" If not diagnosed by a HCP, ask "Are there lots of cases (community spread) where you live?" (See public health department website, if unsure)   * MAJOR community spread: high number of cases; numbers of cases are increasing; many people hospitalized.   * MINOR community spread: low number of cases; not increasing; few or no people hospitalized     *No Answer* 2. ONSET: "When did the COVID-19 symptoms start?"      5/19/203. WORST SYMPTOM: "What is your worst symptom?" (e.g., cough, fever, shortness of breath, muscle aches)    Cough, chest and back  Hurt.   4. COUGH: "Do you have a cough?" If so, ask: "How bad is the cough?"       Dry on a sacle of 1 to 10  5 5. FEVER: "Do you have a fever?" If so, ask: "What is your temperature, how was it measured, and when did it start?"     98.2  Baseline 98 6. RESPIRATORY STATUS: "Describe your breathing?" (e.g., shortness of breath, wheezing, unable to speak)      Chest hurts back hurts also itches  Throat scratchy and sore 7. BETTER-SAME-WORSE: "Are you getting better, staying the same or getting worse compared to yesterday?"  If getting worse, ask, "In what way?"   Worse  8. HIGH RISK DISEASE: "Do you have any chronic medical problems?" (e.g., asthma, heart or lung disease, weak immune system, etc.)     Chronic lung                     Dx.  Compromised lung system.  9. PREGNANCY: "Is there any chance you are pregnant?" "When was your last menstrual  period?"    hyserectomy  10. OTHER SYMPTOMS: "Do you have any other symptoms?"  (e.g., runny nose, headache, sore throat, loss of smell)      headche ,  Congested,  Sore throat  Ulcer on side of tongue,  Runny nose  Protocols used: CORONAVIRUS (COVID-19) DIAGNOSED OR SUSPECTED-A-AH

## 2019-03-11 NOTE — Telephone Encounter (Signed)
fyi

## 2019-03-11 NOTE — Telephone Encounter (Signed)
TA-This was sent to me as an FYI? Not sure/thx dmf

## 2019-03-12 ENCOUNTER — Other Ambulatory Visit: Payer: 59

## 2019-03-12 ENCOUNTER — Encounter: Payer: Self-pay | Admitting: Family Medicine

## 2019-03-12 ENCOUNTER — Telehealth: Payer: Self-pay

## 2019-03-12 ENCOUNTER — Telehealth (INDEPENDENT_AMBULATORY_CARE_PROVIDER_SITE_OTHER): Payer: 59 | Admitting: Family Medicine

## 2019-03-12 ENCOUNTER — Other Ambulatory Visit: Payer: Self-pay

## 2019-03-12 VITALS — Temp 98.7°F

## 2019-03-12 DIAGNOSIS — Z20828 Contact with and (suspected) exposure to other viral communicable diseases: Secondary | ICD-10-CM

## 2019-03-12 DIAGNOSIS — R05 Cough: Secondary | ICD-10-CM | POA: Diagnosis not present

## 2019-03-12 DIAGNOSIS — R0602 Shortness of breath: Secondary | ICD-10-CM

## 2019-03-12 DIAGNOSIS — Z20822 Contact with and (suspected) exposure to covid-19: Secondary | ICD-10-CM

## 2019-03-12 DIAGNOSIS — R6889 Other general symptoms and signs: Secondary | ICD-10-CM

## 2019-03-12 DIAGNOSIS — R52 Pain, unspecified: Secondary | ICD-10-CM | POA: Diagnosis not present

## 2019-03-12 DIAGNOSIS — U071 COVID-19: Secondary | ICD-10-CM | POA: Insufficient documentation

## 2019-03-12 DIAGNOSIS — R059 Cough, unspecified: Secondary | ICD-10-CM

## 2019-03-12 NOTE — Progress Notes (Signed)
Virtual Visit via Video   Due to the COVID-19 pandemic, this visit was completed with telemedicine (audio/video) technology to reduce patient and provider exposure as well as to preserve personal protective equipment.   I connected with [name] by a video enabled telemedicine application and verified that I am speaking with the correct person using two identifiers. Location patient: Home Location provider: [facility], Office Persons participating in the virtual visit: [name], Arnette Norris, MD   I discussed the limitations of evaluation and management by telemedicine and the availability of in person appointments. The patient expressed understanding and agreed to proceed.  Care Team   Patient Care Team: Lucille Passy, MD as PCP - General (Family Medicine)  Subjective:   HPI:   Upper respiratory symptoms- developed a cough 2 days ago with "low grade fever of 98.7-" she states that she "normally runs around 97."  She is having some SOB- afraid to take deep breaths because it triggers coughing spells.  +PND and nasal drainage.  Denies sinus pressure but states "my teeth hurt, my head hurts, my glands hurt, and everything else hurts.  also having severe muscle aches.  She feels this does not feel like sinus infection or cold.  "everything hurts." Review of Systems  Constitutional: Positive for chills, fever and malaise/fatigue.  HENT: Positive for congestion, sinus pain and sore throat.   Respiratory: Positive for cough and shortness of breath.   Cardiovascular: Negative.   Gastrointestinal: Negative.   Genitourinary: Negative.   Musculoskeletal: Positive for myalgias.  Skin: Negative.   Neurological: Negative.   Endo/Heme/Allergies: Negative.   Psychiatric/Behavioral: Negative.   All other systems reviewed and are negative.    Patient Active Problem List   Diagnosis Date Noted  . Suspected Covid-19 Virus Infection 03/12/2019  . Acute maxillary sinusitis  12/26/2018  . Colitis 05/29/2018  . Headache 05/29/2018  . Dehydration 05/29/2018  . BMI 31.0-31.9,adult 01/09/2018  . Pelvic floor weakness in female 12/11/2017  . Disequilibrium 09/03/2017  . Allergic rhinitis 07/13/2016  . Lupus (systemic lupus erythematosus) ([place]) 02/17/2016  . Interstitial lung disease due to connective tissue disease ([place]) 11/07/2015  . Sjogren's disease ([place])   . Lung disease with systemic lupus erythematosus ([place]) 07/14/2015  . Throat clearing 08/10/2014  . Hyperlipidemia 08/10/2014  . Schatzki's ring 03/09/2014  . Unspecified constipation 02/23/2014  . GERD (gastroesophageal reflux disease) 02/23/2014  . Goiter 07/03/2013  . Chronic cough 01/02/2013  . Iron deficiency 03/13/2012  . Dysfunctional uterine bleeding 01/02/2012  . Insomnia 10/03/2011  . Dyslipidemia 09/03/2011  . Overweight 01/03/2011  . 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 Migraine 10/04/2009  . Hypothyroidism 09/09/2009  . Essential hypertension, benign 09/09/2009    Social History   Tobacco Use  . Smoking status: Never Smoker  . Smokeless tobacco: Never Used  Substance Use Topics  . Alcohol use: Yes    Alcohol/week: 0.0 standard drinks    Comment: socially    Current Outpatient Medications:  .  acetaminophen (TYLENOL) 500 MG tablet, Take 1 tablet (500 mg total) by mouth every 6 (six) hours as needed., Disp: 30 tablet, Rfl: 0 .  b complex vitamins capsule, Take 1 capsule by mouth daily., Disp: , Rfl:  .  Biotin 1000 MCG tablet, Take by mouth., Disp: , Rfl:  .  cetirizine (ZYRTEC) 10 MG tablet, Take 10 mg by mouth daily., Disp: , Rfl:  .  diphenoxylate-atropine (LOMOTIL) 2.5-0.025 MG tablet, TAKE 1 TABLET BY MOUTH THREE TIMES  DAILY AS NEEDED FOR DIARRHEA OR LOOSE STOOLS, Disp: 60 tablet, Rfl: 2 .  esomeprazole (NEXIUM) 40 MG capsule, Take 1 capsule (40 mg total) by mouth 2 (two) times daily. pharmacy-pleaes d/c 30 day supply with refills, Disp: 180 capsule,  Rfl: 0 .  hydrochlorothiazide (HYDRODIURIL) 25 MG tablet, Take 1 tablet (25 mg total) by mouth daily., Disp: 90 tablet, Rfl: 1 .  levothyroxine (SYNTHROID, LEVOTHROID) 125 MCG tablet, Take 1 tablet (125 mcg total) by mouth daily., Disp: 90 tablet, Rfl: 1 .  sertraline (ZOLOFT) 100 MG tablet, Take 0.5 tablets (50 mg total) by mouth daily., Disp: 45 tablet, Rfl: 2 .  SUMAtriptan (IMITREX) 100 MG tablet, TAKE 1 TABLET BY MOUTH  EVERY 2 HOURS AS NEEDED FOR MIGRAINE(S) AS DIRECTED, Disp: 20 tablet, Rfl: 1 .  Vitamin D, Cholecalciferol, 400 UNITS TABS, Take 400 Units by mouth daily., Disp: , Rfl:  .  guaiFENesin (MUCINEX) 600 MG 12 hr tablet, Take 1 tablet (600 mg total) by mouth 2 (two) times daily as needed for cough or to loosen phlegm. (Patient not taking: Reported on 03/12/2019), Disp: 14 tablet, Rfl: 0 .  rosuvastatin (CRESTOR) 5 MG tablet, Take 1 tablet (5 mg total) by mouth daily. (Patient not taking: Reported on 03/12/2019), Disp: 90 tablet, Rfl: 3  Allergies  Allergen Reactions  . Contrast Media [Iodinated Diagnostic Agents] Hives and Itching  . Other Hives and Swelling    CATS AND CAT DANDER  . Codeine Nausea Only  . Erythromycin Nausea And Vomiting    Severe abdominal pain  . Iohexol Hives    Objective:  Temp 98.7 F (37.1 C) (Oral)   LMP 04/03/2012 Comment: Still has Cervix and Ovaries  VITALS: Per patient if applicable, see vitals. GENERAL: Alert, appears well and in no acute distress. HEENT: Atraumatic, conjunctiva clear, no obvious abnormalities on inspection of external nose and ears. NECK: Normal movements of the head and neck. CARDIOPULMONARY: No increased WOB. Speaking in clear sentences. I:E ratio WNL.  MS: Moves all visible extremities without noticeable abnormality. PSYCH: Pleasant and cooperative, well-groomed. Speech normal rate and rhythm. Affect is appropriate. Insight and judgement are appropriate. Attention is focused, linear, and appropriate.  NEURO: CN grossly  intact. Oriented as arrived to appointment on time with no prompting. Moves both UE equally.  SKIN: No obvious lesions, wounds, erythema, or cyanosis noted on face or hands.  Depression screen [facility] 2/9 02/04/2018 09/25/2017 08/07/2017  Decreased Interest 0 0 0  Down, Depressed, Hopeless 0 0 0  PHQ - 2 Score 0 0 0  Altered sleeping 3 - -  Tired, decreased energy 3 - -  Change in appetite 0 - -  Feeling bad or failure about yourself  0 - -  Trouble concentrating 0 - -  Moving slowly or fidgety/restless 0 - -  Suicidal thoughts 0 - -  PHQ-9 Score 6 - -  Difficult doing work/chores Not difficult at all - -  Some recent data might be hidden    Assessment and Plan:   [place] was seen today for cough and addendum.  Diagnoses and all orders for this visit:  Cough -     Novel Coronavirus, NAA ([facility]); Future  Body aches -     Novel Coronavirus, NAA ([facility]); Future  SOB (shortness of breath) -     Novel Coronavirus, NAA ([facility]); Future  Suspected Covid-19 Virus Infection    . COVID-19 Education: The signs and symptoms of COVID-19 were discussed with the patient and how to  seek care for testing if needed. The importance of social distancing was discussed today. . Reviewed expectations re: course of current medical issues. . Discussed self-management of symptoms. . Outlined signs and symptoms indicating need for more acute intervention. . Patient verbalized understanding and all questions were answered. Marland Kitchen Health Maintenance issues including appropriate healthy diet, exercise, and smoking avoidance were discussed with patient. . See orders for this visit as documented in the electronic medical record.  Arnette Norris, MD  Records requested if needed. Time spent: 25 minutes, of which >50% was spent in obtaining information about her symptoms, reviewing her previous labs, evaluations, and treatments, counseling her about her condition (please see the discussed topics above), and  developing a plan to further investigate it; she had a number of questions which I addressed.

## 2019-03-12 NOTE — Telephone Encounter (Signed)
Patient called to schedule covid testing per Dr. Birdie Riddle, patient verbalized understanding, appointment scheduled for today at 1245 at [facility], advised location and to wear a mask.

## 2019-03-12 NOTE — Assessment & Plan Note (Addendum)
>  25 minutes spent in face to face time with patient, >50% spent in counselling or coordination of care- currently symptoms mild and non toxic on exam, however she is high risk for a bad outcome- h/o sjorgren's, SLE.  She does work for [employer] covid test order sent to [facility].  I have also enrolled her in the my chart home monitoring program.  We talked about how quickly symptoms can change- any acute respiratory distress and she will call 911. She is not wanting cough suppressant and most antihistamines "dry her out" due to her sjogren's.  Call or send my chart message prn if these symptoms worsen or fail to improve as anticipated. The patient indicates understanding of these issues and agrees with the plan.

## 2019-03-12 NOTE — Telephone Encounter (Signed)
Patient has been scheduled by [facility]

## 2019-03-12 NOTE — Telephone Encounter (Signed)
Please call patient to schedule COVID-19 testing as she had a MyChart Video Visit with Dr. Deborra Medina and is suspected to possibly have COVID-19/thx dmf

## 2019-03-14 ENCOUNTER — Telehealth: Payer: Self-pay | Admitting: *Deleted

## 2019-03-14 NOTE — Telephone Encounter (Signed)
Message forwarded to PCP of attempted call- also message sent to patient in Mychart with advisement and to contact PCP if symptoms need attention.

## 2019-03-14 NOTE — Telephone Encounter (Signed)
BPA- Worsening Cough reported on survey  Attempted to call patient- left message to call back- left [facility] callback number.

## 2019-03-15 ENCOUNTER — Encounter (INDEPENDENT_AMBULATORY_CARE_PROVIDER_SITE_OTHER): Payer: Self-pay

## 2019-03-15 ENCOUNTER — Encounter: Payer: Self-pay | Admitting: Family Medicine

## 2019-03-15 LAB — NOVEL CORONAVIRUS, NAA: SARS-CoV-2, NAA: NOT DETECTED

## 2019-03-17 ENCOUNTER — Other Ambulatory Visit: Payer: Self-pay | Admitting: Family Medicine

## 2019-03-17 MED ORDER — AMOXICILLIN-POT CLAVULANATE 875-125 MG PO TABS: 1.0000 | ORAL_TABLET | Freq: Two times a day (BID) | ORAL | 0 refills | Status: DC

## 2019-03-17 NOTE — Telephone Encounter (Signed)
See below

## 2019-03-18 ENCOUNTER — Encounter: Payer: Self-pay | Admitting: Family Medicine

## 2019-03-19 ENCOUNTER — Other Ambulatory Visit: Payer: Self-pay | Admitting: Family Medicine

## 2019-03-19 MED ORDER — DOXYCYCLINE HYCLATE 100 MG PO TABS: 100.0000 mg | ORAL_TABLET | Freq: Two times a day (BID) | ORAL | 0 refills | Status: DC

## 2019-03-20 ENCOUNTER — Telehealth: Payer: Self-pay | Admitting: Family Medicine

## 2019-03-20 NOTE — Telephone Encounter (Signed)
Pt was advised by PCP 5/28 her COVID result was negative

## 2019-04-30 ENCOUNTER — Other Ambulatory Visit: Payer: Self-pay | Admitting: Family Medicine

## 2019-04-30 ENCOUNTER — Encounter: Payer: Self-pay | Admitting: Family Medicine

## 2019-05-04 ENCOUNTER — Telehealth: Payer: Self-pay

## 2019-05-04 NOTE — Telephone Encounter (Signed)
Copied from [place] [id number]. Topic: General - Other >> May 01, 2019 10:48 AM [place] A wrote: Patient would like a callback from nurse in regards to [facility] paperwork >> May 01, 2019 11:57 AM [place], Domingo Cocking, [name] wrote: Sent staff message to Dr. Deborra Medina for review.

## 2019-05-06 NOTE — Telephone Encounter (Signed)
Form completed/TA signed/is prepared at front with fax coversheet/pt will call from car on Friday to come in and sign form/they will make a copy for the patient as well as fax then send to scanning/thx dmf

## 2019-05-11 ENCOUNTER — Other Ambulatory Visit: Payer: Self-pay | Admitting: Family Medicine

## 2019-05-11 DIAGNOSIS — G43811 Other migraine, intractable, with status migrainosus: Secondary | ICD-10-CM

## 2019-05-20 ENCOUNTER — Other Ambulatory Visit: Payer: Self-pay | Admitting: Internal Medicine

## 2019-07-01 ENCOUNTER — Telehealth: Payer: Self-pay | Admitting: Family Medicine

## 2019-07-01 NOTE — Telephone Encounter (Signed)
Error

## 2019-07-02 ENCOUNTER — Telehealth (INDEPENDENT_AMBULATORY_CARE_PROVIDER_SITE_OTHER): Payer: 59 | Admitting: Family Medicine

## 2019-07-02 ENCOUNTER — Encounter: Payer: Self-pay | Admitting: Family Medicine

## 2019-07-02 ENCOUNTER — Other Ambulatory Visit: Payer: Self-pay

## 2019-07-02 DIAGNOSIS — R42 Dizziness and giddiness: Secondary | ICD-10-CM

## 2019-07-02 DIAGNOSIS — H9202 Otalgia, left ear: Secondary | ICD-10-CM | POA: Diagnosis not present

## 2019-07-02 DIAGNOSIS — J0100 Acute maxillary sinusitis, unspecified: Secondary | ICD-10-CM

## 2019-07-02 MED ORDER — TRIAMCINOLONE ACETONIDE 55 MCG/ACT NA AERO: 2.0000 | INHALATION_SPRAY | Freq: Every day | NASAL | 12 refills | Status: DC

## 2019-07-02 MED ORDER — AZITHROMYCIN 250 MG PO TABS: ORAL_TABLET | ORAL | 0 refills | Status: DC

## 2019-07-02 NOTE — Assessment & Plan Note (Signed)
Given duration and progression of symptoms, will treat for bacterial sinusitis and otitis media with zpack.    Add nasocort.

## 2019-07-02 NOTE — Assessment & Plan Note (Signed)
Probable otitis media with sinusits (see above), but I cannot rule out cerumen impaction. Advised to call tomorrow if no improvement so we can bring her in to evaluate and treat possible cerumen impaction. The patient indicates understanding of these issues and agrees with the plan.

## 2019-07-02 NOTE — Assessment & Plan Note (Signed)
New- progressive- likely due to either otitis vs cerumen impaction. She feel dramamine is working at this time.  Will also send her eply maneuvers through my chart.  The patient indicates understanding of these issues and agrees with the plan.

## 2019-07-02 NOTE — Progress Notes (Signed)
Virtual Visit via Video   Due to the COVID-19 pandemic, this visit was completed with telemedicine (audio/video) technology to reduce patient and provider exposure as well as to preserve personal protective equipment.   I connected with [name] by a video enabled telemedicine application and verified that I am speaking with the correct person using two identifiers. Location patient: Home Location provider: [facility], Office Persons participating in the virtual visit: [name], Arnette Norris, MD   I discussed the limitations of evaluation and management by telemedicine and the availability of in person appointments. The patient expressed understanding and agreed to proceed.  Care Team   Patient Care Team: Lucille Passy, MD as PCP - General (Family Medicine)  Subjective:   HPI:   Left ear pain with vertigo x intermittently for past 6 weeks.  Used OTC cerumen kit but  nothing came out.    For the past 2 days the pain is worse and she cannot hear.   Denies any drainage. If she touches the back of her ear it causes pain as well as sneezing, coughing, and jaw pain.  Her sinuses hurt today.  Teeth hurt, no fever.   This often happens with change in barometric pressure.  Dramamine helps with vertigo.  Review of Systems  Constitutional: Negative for fever.  HENT: Positive for ear pain and sinus pain. Negative for congestion, ear discharge, nosebleeds and sore throat.   Eyes: Negative.   Respiratory: Negative.  Negative for stridor.   Cardiovascular: Negative.   Gastrointestinal: Negative.   Genitourinary: Negative.   Musculoskeletal: Negative.   Neurological: Positive for dizziness.  Endo/Heme/Allergies: Negative.   Psychiatric/Behavioral: Negative.   All other systems reviewed and are negative.    Patient Active Problem List   Diagnosis Date Noted  . Left ear pain 07/02/2019  . Suspected Covid-19 Virus Infection 03/12/2019  . Acute maxillary sinusitis 12/26/2018   . 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 Colitis 05/29/2018  . Headache 05/29/2018  . Dehydration 05/29/2018  . BMI 31.0-31.9,adult 01/09/2018  . Pelvic floor weakness in female 12/11/2017  . Disequilibrium 09/03/2017  . Hypokalemia 08/26/2017  . Right sided numbness 08/25/2017  . Right-sided muscle weakness 08/25/2017  . Vertigo 08/25/2017  . 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 Allergic rhinitis 07/13/2016  . Lupus (systemic lupus erythematosus) ([place]) 02/17/2016  . 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 Sjogren's disease ([place])   . Lung disease with systemic lupus erythematosus ([place]) 07/14/2015  . Throat clearing 08/10/2014  . Hyperlipidemia 08/10/2014  . Schatzki's ring 03/09/2014  . Unspecified constipation 02/23/2014  . GERD (gastroesophageal reflux disease) 02/23/2014  . Goiter 07/03/2013  . Chronic cough 01/02/2013  . Iron deficiency 03/13/2012  . Dysfunctional uterine bleeding 01/02/2012  . Insomnia 10/03/2011  . Dyslipidemia 09/03/2011  . Overweight 01/03/2011  . 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 Migraine 10/04/2009  . Hypothyroidism 09/09/2009  . Essential hypertension, benign 09/09/2009    Social History   Tobacco Use  . Smoking status: Never Smoker  . Smokeless tobacco: Never Used  Substance Use Topics  . Alcohol use: Yes    Alcohol/week: 0.0 standard drinks    Comment: socially    Current Outpatient Medications:  .  acetaminophen (TYLENOL) 500 MG tablet, Take 1 tablet (500 mg total) by mouth every 6 (six) hours as needed., Disp: 30 tablet, Rfl: 0 .  b complex vitamins capsule, Take 1 capsule by mouth daily., Disp: , Rfl:  .  Biotin 1000 MCG tablet, Take by mouth., Disp: , Rfl:  .  cetirizine (ZYRTEC) 10 MG tablet, Take 10 mg by mouth daily., Disp: , Rfl:  .  diphenoxylate-atropine (LOMOTIL) 2.5-0.025 MG tablet, TAKE 1 TABLET BY MOUTH THREE TIMES DAILY AS NEEDED FOR DIARRHEA OR LOOSE STOOLS, Disp:  60 tablet, Rfl: 2 .  esomeprazole (NEXIUM) 40 MG capsule, TAKE 1 CAPSULE BY MOUTH TWICE DAILY, Disp: 180 capsule, Rfl: 0 .  hydrochlorothiazide (HYDRODIURIL) 25 MG tablet, Take 1 tablet (25 mg total) by mouth daily., Disp: 90 tablet, Rfl: 1 .  levothyroxine (SYNTHROID, LEVOTHROID) 125 MCG tablet, Take 1 tablet (125 mcg total) by mouth daily., Disp: 90 tablet, Rfl: 1 .  sertraline (ZOLOFT) 100 MG tablet, Take 1/2 (one-half) tablet by mouth once daily, Disp: 45 tablet, Rfl: 0 .  SUMAtriptan (IMITREX) 100 MG tablet, TAKE 1 TABLET BY MOUTH EVERY 2 HOURS AS NEEDED FOR MIGRAINE(S) AS DIRECTED, Disp: 20 tablet, Rfl: 1 .  Vitamin D, Cholecalciferol, 400 UNITS TABS, Take 400 Units by mouth daily., Disp: , Rfl:   Allergies  Allergen Reactions  . Contrast Media [Iodinated Diagnostic Agents] Hives and Itching  . Other Hives and Swelling    CATS AND CAT DANDER  . Codeine Nausea Only  . Erythromycin Nausea And Vomiting    Severe abdominal pain  . Iohexol Hives    Objective:  Temp (!) 96.8 F (36 C) (Oral)   LMP 04/03/2012 Comment: Still has Cervix and Ovaries  VITALS: Per patient if applicable, see vitals. GENERAL: Alert, appears well and in no acute distress. HEENT: Atraumatic, conjunctiva clear, no obvious abnormalities on inspection of external nose and ears. No pain with movement of ear. NECK: Normal movements of the head and neck. CARDIOPULMONARY: No increased WOB. Speaking in clear sentences. I:E ratio WNL.  MS: Moves all visible extremities without noticeable abnormality. PSYCH: Pleasant and cooperative, well-groomed. Speech normal rate and rhythm. Affect is appropriate. Insight and judgement are appropriate. Attention is focused, linear, and appropriate.  NEURO: CN grossly intact. Oriented as arrived to appointment on time with no prompting. Moves both UE equally.  SKIN: No obvious lesions, wounds, erythema, or cyanosis noted on face or hands.  Depression screen [facility] 2/9 02/04/2018  09/25/2017 08/07/2017  Decreased Interest 0 0 0  Down, Depressed, Hopeless 0 0 0  PHQ - 2 Score 0 0 0  Altered sleeping 3 - -  Tired, decreased energy 3 - -  Change in appetite 0 - -  Feeling bad or failure about yourself  0 - -  Trouble concentrating 0 - -  Moving slowly or fidgety/restless 0 - -  Suicidal thoughts 0 - -  PHQ-9 Score 6 - -  Difficult doing work/chores Not difficult at all - -  Some recent data might be hidden    Assessment and Plan:   Dasja was seen today for otalgia.  Diagnoses and all orders for this visit:  Left ear pain    . COVID-19 Education: The signs and symptoms of COVID-19 were discussed with the patient and how to seek care for testing if needed. The importance of social distancing was discussed today. . Reviewed expectations re: course of current medical issues. . Discussed self-management of symptoms. . Outlined signs and symptoms indicating need for more acute intervention. . Patient verbalized understanding and all questions were answered. Marland Kitchen Health Maintenance issues including appropriate healthy diet, exercise, and smoking avoidance were discussed with patient. . See orders for this visit as documented in the electronic  medical record.  Arnette Norris, MD  Records requested if needed. Time spent: 25 minutes, of which >50% was spent in obtaining information about her symptoms, reviewing her previous labs, evaluations, and treatments, counseling her about her condition (please see the discussed topics above), and developing a plan to further investigate it; she had a number of questions which I addressed.

## 2019-07-03 ENCOUNTER — Other Ambulatory Visit: Payer: Self-pay | Admitting: Family Medicine

## 2019-07-10 ENCOUNTER — Encounter: Payer: Self-pay | Admitting: Family Medicine

## 2019-07-13 ENCOUNTER — Ambulatory Visit: Payer: 59 | Admitting: Family Medicine

## 2019-07-13 ENCOUNTER — Encounter: Payer: Self-pay | Admitting: Family Medicine

## 2019-07-13 ENCOUNTER — Ambulatory Visit (INDEPENDENT_AMBULATORY_CARE_PROVIDER_SITE_OTHER): Payer: 59

## 2019-07-13 ENCOUNTER — Other Ambulatory Visit: Payer: Self-pay

## 2019-07-13 VITALS — BP 126/90 | HR 95 | Temp 99.3°F | Wt 195.8 lb

## 2019-07-13 DIAGNOSIS — H6122 Impacted cerumen, left ear: Secondary | ICD-10-CM | POA: Diagnosis not present

## 2019-07-13 DIAGNOSIS — J8489 Other specified interstitial pulmonary diseases: Secondary | ICD-10-CM

## 2019-07-13 DIAGNOSIS — M358 Other specified systemic involvement of connective tissue: Secondary | ICD-10-CM | POA: Diagnosis not present

## 2019-07-13 DIAGNOSIS — J069 Acute upper respiratory infection, unspecified: Secondary | ICD-10-CM | POA: Diagnosis not present

## 2019-07-13 MED ORDER — LEVOFLOXACIN 500 MG PO TABS: 500.0000 mg | ORAL_TABLET | Freq: Every day | ORAL | 0 refills | Status: DC

## 2019-07-13 NOTE — Progress Notes (Signed)
Subjective:   Patient ID: Gina Wise, female    DOB: 1961-06-21, 58 y.o.   MRN: [id number]  Gina Wise is a pleasant 58 y.o. year old female who presents to clinic today with Ear Problem (Pt is here today C/O not being able to hear  out of left ear. Sx started 6-weeks-ago and is painful, itches, and there is crackling. She said her head feels full. She thinks that she has something big going on because her chest feels like it did when she had pleuritis. States that when she took the Z-pak it started to feel better but came back full force.)  on 07/13/2019  HPI:  Left ear- having difficulty hearing out of her left ear. Symptoms started 6 weeks ago- painful, itchy, crackling.  Head feels full. Low grade temp.  Her chest feels like when she had pleuritis.  States that the zpack helped and then once she finished it "came back in full force."  Finished zpack last week.  She has had a low grade fever for days.  When she takes deep breaths, getting right shoulder pain.   Current Outpatient Medications on File Prior to Visit  Medication Sig Dispense Refill   acetaminophen (TYLENOL) 500 MG tablet Take 1 tablet (500 mg total) by mouth every 6 (six) hours as needed. 30 tablet 0   b complex vitamins capsule Take 1 capsule by mouth daily.     Biotin 1000 MCG tablet Take by mouth.     cetirizine (ZYRTEC) 10 MG tablet Take 10 mg by mouth daily.     diphenoxylate-atropine (LOMOTIL) 2.5-0.025 MG tablet TAKE 1 TABLET BY MOUTH THREE TIMES DAILY AS NEEDED FOR DIARRHEA OR LOOSE STOOLS 60 tablet 2   esomeprazole (NEXIUM) 40 MG capsule TAKE 1 CAPSULE BY MOUTH TWICE DAILY 180 capsule 0   hydrochlorothiazide (HYDRODIURIL) 25 MG tablet TAKE 1 TABLET(25 MG) BY MOUTH DAILY 90 tablet 1   levothyroxine (SYNTHROID) 125 MCG tablet TAKE 1 TABLET(125 MCG) BY MOUTH DAILY 90 tablet 1   sertraline (ZOLOFT) 100 MG tablet Take 1/2 (one-half) tablet by mouth once daily 45 tablet 0   SUMAtriptan (IMITREX) 100 MG  tablet TAKE 1 TABLET BY MOUTH EVERY 2 HOURS AS NEEDED FOR MIGRAINE(S) AS DIRECTED 20 tablet 1   triamcinolone (NASACORT) 55 MCG/ACT AERO nasal inhaler Place 2 sprays into the nose daily. 1 Inhaler 12   Vitamin D, Cholecalciferol, 400 UNITS TABS Take 400 Units by mouth daily.     No current facility-administered medications on file prior to visit.     Allergies  Allergen Reactions   Contrast Media [Iodinated Diagnostic Agents] Hives and Itching   Other Hives and Swelling    CATS AND CAT DANDER   Codeine Nausea Only   Erythromycin Nausea And Vomiting    Severe abdominal pain   Iohexol Hives    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   Allergy    Anemia    Bruising 05/10/2015   CAP (community acquired pneumonia) 08/03/2015   Chronic bronchitis ([place])    "get it q yr; in the spring time" (02/17/2014)   Depression    "have taken zoloft since 1996" (02/17/2014)   Diverticulosis    Dyspnea    from scar tissue   Epistaxis 12/01/2015   Gastritis    GERD (gastroesophageal reflux disease)    Hematemesis with nausea 08/01/2016   Hiatal hernia    High cholesterol    Hypertension    Hypothyroid  Internal hemorrhoids    Migraines    "maybe a couple/yr" (02/17/2014)   Multiple environmental allergies    Overweight(278.02)    Peri-menopause    Schatzki's ring    Sjogren's disease ([place])    Systemic lupus ([place])     Past Surgical History:  Procedure Laterality Date   APPENDECTOMY  1987   ESOPHAGOGASTRODUODENOSCOPY N/A 02/28/2014   Procedure: ESOPHAGOGASTRODUODENOSCOPY (EGD);  Surgeon: Jerene Bears, MD;  Location[facility] ENDOSCOPY;  Service: Endoscopy;  Laterality: N/A;   ESOPHAGOGASTRODUODENOSCOPY N/A 08/02/2016   Procedure: ESOPHAGOGASTRODUODENOSCOPY (EGD);  Surgeon: Gatha Mayer, MD;  Location: [facility] ENDOSCOPY;  Service: Endoscopy;  Laterality: N/A;   EXPLORATORY LAPAROTOMY  1987   FOOT SURGERY Right ~1980   "don't know  what they did; had to take something out"   INGUINAL HERNIA REPAIR Right ~ 2003   RADIOACTIVE SEED GUIDED EXCISIONAL BREAST BIOPSY Left 02/25/2018   Procedure: RADIOACTIVE SEED GUIDED EXCISIONAL BREAST BIOPSY;  Surgeon: Rolm Bookbinder, MD;  Location: [place];  Service: General;  Laterality: Left;   [place] Left 1997   "cartilage"   [place]   laparoscopic   VAGINAL HYSTERECTOMY  04/08/2012   menorrhagia   VAGINAL PROLAPSE REPAIR  08/04/2018   Anterior and posterior with colp-mesh-Dr. Mikle Bosworth @ [facility]    Family History  Problem Relation Age of Onset   Hypertension Father    Vision loss Father    Alcohol abuse Father    Emphysema Father        Smoker   Hearing loss Father    Early death Father        Murdered   Hypertension Mother    Heart disease Mother    Hepatitis Mother        Hep C   Endometriosis Mother    Pancreatic disease Mother    Depression Mother    Hearing loss Mother    Lupus Sister    Endometriosis Sister    Arthritis Sister    Depression Sister    Breast cancer Paternal Aunt        34's   Lung cancer Paternal Uncle        smoker   Colon cancer Maternal Grandfather    Uterine cancer Maternal Aunt        50's   Emphysema Paternal Aunt        Non-smoker/Died of Pneumonia   Lung cancer Paternal Uncle    Heart attack Maternal Uncle    Parkinson's disease Maternal Grandmother    Alcohol abuse Maternal Grandmother    Heart disease Paternal Grandmother        Died during angioplasty surgery   Aneurysm Paternal Grandfather    Allergy (severe) Son        ASA-Idiopathic Thrombocytopenia   Early death Maternal Uncle        Murdered   Lung disease Paternal Aunt        Bronchiecstasis   Lung cancer Paternal Uncle    Early death Paternal Uncle        Carbon Monoxide  Poisoning/Accidental   Stomach cancer Neg Hx    Pancreatic cancer Neg Hx     Social History   Socioeconomic History   Marital status: Single    Spouse name: Not on file   Number of children: Not on file   Years of education: Not on file  Highest education level: Not on file  Occupational History   Not on file  Social Needs   Financial resource strain: Not on file   Food insecurity    Worry: Not on file    Inability: Not on file   Transportation needs    Medical: Not on file    Non-medical: Not on file  Tobacco Use   Smoking status: Never Smoker   Smokeless tobacco: Never Used  Substance and Sexual Activity   Alcohol use: Yes    Alcohol/week: 0.0 standard drinks    Comment: socially   Drug use: No   Sexual activity: Not on file  Lifestyle   Physical activity    Days per week: Not on file    Minutes per session: Not on file   Stress: Not on file  Relationships   Social connections    Talks on phone: Not on file    Gets together: Not on file    Attends religious service: Not on file    Active member of club or organization: Not on file    Attends meetings of clubs or organizations: Not on file    Relationship status: Not on file   Intimate partner violence    Fear of current or ex partner: Not on file    Emotionally abused: Not on file    Physically abused: Not on file    Forced sexual activity: Not on file  Other Topics Concern   Not on file  Social History Narrative   Works at urgent care.    The PMH, PSH, Social History, Family History, Medications, and allergies have been reviewed in [facility], and have been updated if relevant.   Review of Systems  Constitutional: Positive for chills, fatigue and fever.  HENT: Positive for congestion, ear pain, hearing loss, sinus pressure and sinus pain. Negative for sneezing, sore throat, trouble swallowing and voice change.   Eyes: Negative.   Respiratory: Positive for cough and shortness of breath.     Cardiovascular: Negative.   Gastrointestinal: Negative.   Endocrine: Negative.   Genitourinary: Negative.   Musculoskeletal: Positive for arthralgias.  Allergic/Immunologic: Negative.   Neurological: Negative.   Psychiatric/Behavioral: Negative.   All other systems reviewed and are negative.      Objective:    BP 126/90 (BP Location: Left Arm, Patient Position: Sitting, Cuff Size: Normal)    Pulse 95    Temp 99.3 F (37.4 C) (Oral)    Wt 195 lb 12.8 oz (88.8 kg)    LMP 04/03/2012 Comment: Still has Cervix and Ovaries   SpO2 97%    BMI 31.60 kg/m    Physical Exam Vitals signs and nursing note reviewed.  Constitutional:      General: She is not in acute distress.    Appearance: Normal appearance. She is normal weight. She is not ill-appearing, toxic-appearing or diaphoretic.  HENT:     Head: Normocephalic and atraumatic.     Right Ear: Tympanic membrane normal.     Left Ear: Tympanic membrane and external ear normal.     Nose: Congestion present.     Right Sinus: Frontal sinus tenderness present.     Left Sinus: Frontal sinus tenderness present.     Mouth/Throat:     Mouth: Mucous membranes are moist.  Eyes:     Extraocular Movements: Extraocular movements intact.  Pulmonary:     Breath sounds: Examination of the right-lower field reveals decreased breath sounds. Decreased breath sounds present. No wheezing or rhonchi.  Comments: + pain in left shoulder when taking deep breaths. Neurological:     Mental Status: She is alert.           Assessment & Plan:   Impacted cerumen of left ear - Plan: EAR CERUMEN REMOVAL, DG Chest 2 View, DG Chest 2 View  Left ear impacted cerumen  Hearing loss due to cerumen impaction, left  Upper respiratory tract infection, unspecified type  Interstitial lung disease due to connective tissue disease ([place]), Chronic  Major depressive disorder, recurrent episode, moderate ([place]), Chronic No follow-ups on file.

## 2019-07-13 NOTE — Patient Instructions (Signed)
Great to see you.  Take levaquin as directed.  I will call you with your xray results.

## 2019-07-13 NOTE — Assessment & Plan Note (Signed)
Symptoms concerning for ? Referred pain from diaphragm/ pleurisy. CXR, Levaquin.

## 2019-07-13 NOTE — Assessment & Plan Note (Signed)
Ceruminosis is noted.  Wax is removed by syringing and manual debridement. Instructions for home care to prevent wax buildup are given.  

## 2019-07-14 ENCOUNTER — Encounter: Payer: Self-pay | Admitting: Family Medicine

## 2019-07-15 ENCOUNTER — Encounter: Payer: Self-pay | Admitting: Family Medicine

## 2019-08-07 ENCOUNTER — Other Ambulatory Visit: Payer: Self-pay | Admitting: Family Medicine

## 2019-08-10 ENCOUNTER — Other Ambulatory Visit: Payer: Self-pay | Admitting: *Deleted

## 2019-08-10 MED ORDER — ESOMEPRAZOLE MAGNESIUM 40 MG PO CPDR: 40.0000 mg | DELAYED_RELEASE_CAPSULE | Freq: Two times a day (BID) | ORAL | 1 refills | Status: DC

## 2019-11-06 NOTE — Telephone Encounter (Signed)
Patient is calling back regarding forms. CB is [phone].

## 2019-11-11 ENCOUNTER — Encounter: Payer: Self-pay | Admitting: Family Medicine

## 2019-11-15 ENCOUNTER — Encounter: Payer: Self-pay | Admitting: Family Medicine

## 2019-11-16 NOTE — Progress Notes (Signed)
Virtual Visit via Video   Due to the COVID-19 pandemic, this visit was completed with telemedicine (audio/video) technology to reduce patient and provider exposure as well as to preserve personal protective equipment.   I connected with Gina Wise by a video enabled telemedicine application and verified that I am speaking with the correct person using two identifiers. Location patient: Home Location provider: [facility], Office Persons participating in the virtual visit: Gina Wise, Arnette Norris, MD   I discussed the limitations of evaluation and management by telemedicine and the availability of in person appointments. The patient expressed understanding and agreed to proceed.  Care Team   Patient Care Team: Lucille Passy, MD as PCP - General (Family Medicine)  Subjective:   HPI: Patient is connecting C/O a cough and fever  See problem based charting.  .Review of Systems  Constitutional: Positive for fever. Negative for malaise/fatigue.  HENT: Negative for congestion and hearing loss.   Eyes: Negative for blurred vision, discharge and redness.  Respiratory: Positive for cough. Negative for shortness of breath.   Cardiovascular: Negative for chest pain, palpitations and leg swelling.  Gastrointestinal: Positive for diarrhea and nausea. Negative for abdominal pain and heartburn.  Genitourinary: Negative for dysuria.  Musculoskeletal: Negative for falls.  Skin: Negative for rash.  Neurological: Negative for loss of consciousness and headaches.  Endo/Heme/Allergies: Does not bruise/bleed easily.  Psychiatric/Behavioral: Negative for depression.     Patient Active Problem List   Diagnosis Date Noted  . Left ear impacted cerumen 07/13/2019  . Hearing loss due to cerumen impaction, left 07/13/2019  . Upper respiratory infection 07/13/2019  . Impacted cerumen of left ear 07/13/2019  . Left ear pain 07/02/2019  . COVID-19 03/12/2019  . Acute maxillary sinusitis 12/26/2018    . 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 Colitis 05/29/2018  . Headache 05/29/2018  . Dehydration 05/29/2018  . BMI 31.0-31.9,adult 01/09/2018  . Pelvic floor weakness in female 12/11/2017  . Disequilibrium 09/03/2017  . Hypokalemia 08/26/2017  . Right sided numbness 08/25/2017  . Right-sided muscle weakness 08/25/2017  . Vertigo 08/25/2017  . 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 Allergic rhinitis 07/13/2016  . Lupus (systemic lupus erythematosus) ([place]) 02/17/2016  . Interstitial lung disease due to connective tissue disease ([place]) 11/07/2015  . Sjogren's disease ([place])   . Lung disease with systemic lupus erythematosus ([place]) 07/14/2015  . Throat clearing 08/10/2014  . Hyperlipidemia 08/10/2014  . Schatzki's ring 03/09/2014  . Unspecified constipation 02/23/2014  . GERD (gastroesophageal reflux disease) 02/23/2014  . Goiter 07/03/2013  . Chronic cough 01/02/2013  . Iron deficiency 03/13/2012  . Dysfunctional uterine bleeding 01/02/2012  . Insomnia 10/03/2011  . Dyslipidemia 09/03/2011  . Overweight 01/03/2011  . 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 Migraine 10/04/2009  . Hypothyroidism 09/09/2009  . Essential hypertension, benign 09/09/2009    Social History   Tobacco Use  . Smoking status: Never Smoker  . Smokeless tobacco: Never Used  Substance Use Topics  . Alcohol use: Yes    Alcohol/week: 0.0 standard drinks    Comment: socially    Current Outpatient Medications:  .  acetaminophen (TYLENOL) 500 MG tablet, Take 1 tablet (500 mg total) by mouth every 6 (six) hours as needed., Disp: 30 tablet, Rfl: 0 .  b complex vitamins capsule, Take 1 capsule by mouth daily., Disp: , Rfl:  .  Biotin 1000 MCG tablet, Take by mouth., Disp: , Rfl:  .  cetirizine (ZYRTEC) 10 MG tablet, Take 10 mg by mouth  daily., Disp: , Rfl:  .  diphenoxylate-atropine (LOMOTIL) 2.5-0.025 MG tablet, TAKE 1 TABLET BY MOUTH THREE TIMES DAILY AS NEEDED FOR DIARRHEA OR LOOSE STOOLS, Disp:  60 tablet, Rfl: 2 .  esomeprazole (NEXIUM) 40 MG capsule, Take 1 capsule (40 mg total) by mouth 2 (two) times daily., Disp: 180 capsule, Rfl: 1 .  hydrochlorothiazide (HYDRODIURIL) 25 MG tablet, TAKE 1 TABLET(25 MG) BY MOUTH DAILY, Disp: 90 tablet, Rfl: 1 .  levofloxacin (LEVAQUIN) 500 MG tablet, Take 1 tablet (500 mg total) by mouth daily., Disp: 7 tablet, Rfl: 0 .  levofloxacin (LEVAQUIN) 500 MG tablet, Take 1 tablet (500 mg total) by mouth daily., Disp: 7 tablet, Rfl: 0 .  levothyroxine (SYNTHROID) 125 MCG tablet, TAKE 1 TABLET(125 MCG) BY MOUTH DAILY, Disp: 90 tablet, Rfl: 1 .  ondansetron (ZOFRAN) 8 MG tablet, Take 1 tablet (8 mg total) by mouth every 8 (eight) hours as needed for nausea, vomiting or refractory nausea / vomiting., Disp: 30 tablet, Rfl: 0 .  sertraline (ZOLOFT) 100 MG tablet, Take 1/2 (one-half) tablet by mouth once daily, Disp: 45 tablet, Rfl: 3 .  SUMAtriptan (IMITREX) 100 MG tablet, TAKE 1 TABLET BY MOUTH EVERY 2 HOURS AS NEEDED FOR MIGRAINE(S) AS DIRECTED, Disp: 20 tablet, Rfl: 1 .  triamcinolone (NASACORT) 55 MCG/ACT AERO nasal inhaler, Place 2 sprays into the nose daily., Disp: 1 Inhaler, Rfl: 12 .  Vitamin D, Cholecalciferol, 400 UNITS TABS, Take 400 Units by mouth daily., Disp: , Rfl:   Allergies  Allergen Reactions  . Contrast Media [Iodinated Diagnostic Agents] Hives and Itching  . Other Hives and Swelling    CATS AND CAT DANDER  . Codeine Nausea Only  . Erythromycin Nausea And Vomiting    Severe abdominal pain  . Iohexol Hives    Objective:  Temp 99.9 F (37.7 C)   LMP 04/03/2012 Comment: Still has Cervix and Ovaries  VITALS: Per patient if applicable, see vitals. GENERAL: Alert, appears well and in no acute distress. HEENT: Atraumatic, conjunctiva clear, no obvious abnormalities on inspection of external nose and ears. NECK: Normal movements of the head and neck. CARDIOPULMONARY: No increased WOB. Speaking in clear sentences. I:E ratio WNL.  MS: Moves  all visible extremities without noticeable abnormality. PSYCH: Pleasant and cooperative, well-groomed. Speech normal rate and rhythm. Affect is appropriate. Insight and judgement are appropriate. Attention is focused, linear, and appropriate.  NEURO: CN grossly intact. Oriented as arrived to appointment on time with no prompting. Moves both UE equally.  SKIN: No obvious lesions, wounds, erythema, or cyanosis noted on face or hands.  Depression screen [facility] 2/9 02/04/2018 09/25/2017 08/07/2017  Decreased Interest 0 0 0  Down, Depressed, Hopeless 0 0 0  PHQ - 2 Score 0 0 0  Altered sleeping 3 - -  Tired, decreased energy 3 - -  Change in appetite 0 - -  Feeling bad or failure about yourself  0 - -  Trouble concentrating 0 - -  Moving slowly or fidgety/restless 0 - -  Suicidal thoughts 0 - -  PHQ-9 Score 6 - -  Difficult doing work/chores Not difficult at all - -  Some recent data might be hidden     . COVID-19 Education: The signs and symptoms of COVID-19 were discussed with the patient and how to seek care for testing if needed. The importance of social distancing was discussed today. . Reviewed expectations re: course of current medical issues. . Discussed self-management of symptoms. . Outlined signs and symptoms indicating  need for more acute intervention. . Patient verbalized understanding and all questions were answered. Marland Kitchen Health Maintenance issues including appropriate healthy diet, exercise, and smoking avoidance were discussed with patient. . See orders for this visit as documented in the electronic medical record.  Arnette Norris, MD  Records requested if needed. Time spent: 25 minutes, of which >50% was spent in obtaining information about her symptoms, reviewing her previous labs, evaluations, and treatments, counseling her about her condition (please see the discussed topics above), and developing a plan to further investigate it; she had a number of questions which I addressed.    Lab Results  Component Value Date   WBC 6.2 05/29/2018   HGB 14.0 05/29/2018   HCT 41.4 05/29/2018   PLT 310.0 05/29/2018   GLUCOSE 95 05/29/2018   CHOL 234 (A) 06/04/2018   TRIG 188 (A) 06/04/2018   HDL 49 06/04/2018   LDLDIRECT 144 (H) 09/03/2011   LDLCALC 147 06/04/2018   ALT 13 05/29/2018   AST 14 05/29/2018   NA 143 05/29/2018   K 3.7 05/29/2018   CL 104 05/29/2018   CREATININE 0.8 06/04/2018   BUN 11 05/29/2018   CO2 32 05/29/2018   TSH 3.810 12/11/2017   INR 1.05 02/17/2014   HGBA1C 5.4 06/04/2018    Lab Results  Component Value Date   TSH 3.810 12/11/2017   Lab Results  Component Value Date   WBC 6.2 05/29/2018   HGB 14.0 05/29/2018   HCT 41.4 05/29/2018   MCV 87.0 05/29/2018   PLT 310.0 05/29/2018   Lab Results  Component Value Date   NA 143 05/29/2018   K 3.7 05/29/2018   CO2 32 05/29/2018   GLUCOSE 95 05/29/2018   BUN 11 05/29/2018   CREATININE 0.8 06/04/2018   BILITOT 0.5 05/29/2018   ALKPHOS 68 05/29/2018   AST 14 05/29/2018   ALT 13 05/29/2018   PROT 7.4 05/29/2018   ALBUMIN 4.4 05/29/2018   CALCIUM 9.6 05/29/2018   ANIONGAP 14 02/21/2018   GFR 79.94 05/29/2018   Lab Results  Component Value Date   CHOL 234 (A) 06/04/2018   Lab Results  Component Value Date   HDL 49 06/04/2018   Lab Results  Component Value Date   LDLCALC 147 06/04/2018   Lab Results  Component Value Date   TRIG 188 (A) 06/04/2018   Lab Results  Component Value Date   CHOLHDL 3.1 02/04/2018   Lab Results  Component Value Date   HGBA1C 5.4 06/04/2018       Assessment & Plan:   Problem List Items Addressed This Visit      Active Problems   COVID-19     She received results as Positive for COVID from [facility] dated 1.27.21.   Fever has improved, still having chills chills, cough, congestion, runny nose, shortness of breath, fatigue, body aches, sore throat, headache, nausea, vomiting, diarrhea, or new loss of taste or smell.  eRx sent to pharmacy for  zofran, advised hydration- see my chart message for details.  Also start Levaquin 500 mg daily x 7 days as her cough is more productive for potential covid related pneumonia.  Call or send my chart message prn if these symptoms worsen or fail to improve as anticipated.  The patient indicates understanding of these issues and agrees with the plan.          Relevant Orders   Temperature monitoring      I am having Gina Wise start on ondansetron and  levofloxacin. I am also having her maintain her Vitamin D (Cholecalciferol), b complex vitamins, Biotin, acetaminophen, cetirizine, diphenoxylate-atropine, SUMAtriptan, triamcinolone, levothyroxine, hydrochlorothiazide, levofloxacin, esomeprazole, and sertraline.  Meds ordered this encounter  Medications  . sertraline (ZOLOFT) 100 MG tablet    Sig: Take 1/2 (one-half) tablet by mouth once daily    Dispense:  45 tablet    Refill:  3  . ondansetron (ZOFRAN) 8 MG tablet    Sig: Take 1 tablet (8 mg total) by mouth every 8 (eight) hours as needed for nausea, vomiting or refractory nausea / vomiting.    Dispense:  30 tablet    Refill:  0  . levofloxacin (LEVAQUIN) 500 MG tablet    Sig: Take 1 tablet (500 mg total) by mouth daily.    Dispense:  7 tablet    Refill:  0     Arnette Norris, MD

## 2019-11-18 LAB — NOVEL CORONAVIRUS, NAA: SARS-CoV-2, NAA: POSITIVE

## 2019-11-19 ENCOUNTER — Telehealth (INDEPENDENT_AMBULATORY_CARE_PROVIDER_SITE_OTHER): Payer: 59 | Admitting: Family Medicine

## 2019-11-19 ENCOUNTER — Encounter: Payer: Self-pay | Admitting: Family Medicine

## 2019-11-19 DIAGNOSIS — U071 COVID-19: Secondary | ICD-10-CM | POA: Diagnosis not present

## 2019-11-19 MED ORDER — HYDROCODONE-HOMATROPINE 5-1.5 MG/5ML PO SYRP: 5.0000 mL | ORAL_SOLUTION | Freq: Three times a day (TID) | ORAL | 0 refills | Status: DC | PRN

## 2019-11-19 MED ORDER — LEVOFLOXACIN 500 MG PO TABS: 500.0000 mg | ORAL_TABLET | Freq: Every day | ORAL | 0 refills | Status: DC

## 2019-11-19 MED ORDER — SERTRALINE HCL 100 MG PO TABS: ORAL_TABLET | ORAL | 3 refills | Status: DC

## 2019-11-19 MED ORDER — ONDANSETRON HCL 8 MG PO TABS: 8.0000 mg | ORAL_TABLET | Freq: Three times a day (TID) | ORAL | 0 refills | Status: DC | PRN

## 2019-11-19 NOTE — Assessment & Plan Note (Addendum)
She received results as Positive for COVID from [facility] dated 1.27.21.   Fever has improved, still having chills chills, cough, congestion, runny nose, shortness of breath, fatigue, body aches, sore throat, headache, nausea, vomiting, diarrhea, or new loss of taste or smell.  eRx sent to pharmacy for zofran, advised hydration- see my chart message for details.  Also start Levaquin 500 mg daily x 7 days as her cough is more productive for potential covid related pneumonia.  Call or send my chart message prn if these symptoms worsen or fail to improve as anticipated.  The patient indicates understanding of these issues and agrees with the plan.

## 2019-11-20 ENCOUNTER — Encounter: Payer: Self-pay | Admitting: Family Medicine

## 2019-11-23 MED ORDER — SODIUM CHLORIDE FLUSH 0.9 % IV SOLN
5.00 | INTRAVENOUS | Status: DC
Start: ? — End: 2019-11-23

## 2019-11-23 MED ORDER — SODIUM CHLORIDE FLUSH 0.9 % IV SOLN
5.00 | INTRAVENOUS | Status: DC
Start: 2019-11-21 — End: 2019-11-23

## 2019-11-24 ENCOUNTER — Other Ambulatory Visit: Payer: Self-pay | Admitting: Family Medicine

## 2019-11-24 NOTE — Progress Notes (Signed)
covi

## 2019-12-04 ENCOUNTER — Encounter: Payer: Self-pay | Admitting: Family Medicine

## 2019-12-04 ENCOUNTER — Telehealth: Payer: Self-pay | Admitting: Family Medicine

## 2019-12-04 NOTE — Telephone Encounter (Signed)
Patient is calling and is asking for a note to return to work from being out with covid. Pls advise. CB is [phone]

## 2019-12-04 NOTE — Telephone Encounter (Signed)
Yes ok to write letter

## 2019-12-04 NOTE — Telephone Encounter (Signed)
Created letter and sent via MyChart/thx dmf

## 2019-12-04 NOTE — Telephone Encounter (Signed)
MC-May I create a note for pt to RTN to work on Monday? Dr. Deborra Medina completed all of her FMLA forms/she was out from 1.27.21 and will go back on 2.15.21/Plz advise if ok/thx dmf

## 2019-12-14 ENCOUNTER — Other Ambulatory Visit: Payer: Self-pay

## 2019-12-14 MED ORDER — ONDANSETRON HCL 8 MG PO TABS: 8.0000 mg | ORAL_TABLET | Freq: Three times a day (TID) | ORAL | 2 refills | Status: DC | PRN

## 2020-01-04 ENCOUNTER — Other Ambulatory Visit: Payer: Self-pay

## 2020-01-04 DIAGNOSIS — G43811 Other migraine, intractable, with status migrainosus: Secondary | ICD-10-CM

## 2020-01-04 MED ORDER — SUMATRIPTAN SUCCINATE 100 MG PO TABS: ORAL_TABLET | ORAL | 0 refills | Status: DC

## 2020-01-14 ENCOUNTER — Ambulatory Visit: Payer: 59 | Attending: Internal Medicine

## 2020-01-14 DIAGNOSIS — Z23 Encounter for immunization: Secondary | ICD-10-CM

## 2020-01-14 NOTE — Progress Notes (Signed)
   Covid-19 Vaccination Clinic  Name:  Eldeen Kary    MRN: [id number] DOB: [birth year]  01/14/2020  Ms. Larocca was observed post Covid-19 immunization for 15 minutes without incident. She was provided with Vaccine Information Sheet and instruction to access the V-Safe system.   Ms. Meeuwsen was instructed to call 911 with any severe reactions post vaccine: Marland Kitchen Difficulty breathing  . Swelling of face and throat  . A fast heartbeat  . A bad rash all over body  . Dizziness and weakness   Immunizations Administered    Name Date Dose VIS Date Route   Pfizer COVID-19 Vaccine 01/14/2020  3:37 PM 0.3 mL 10/02/2019 Intramuscular   Manufacturer: [place]   Lot: [id number]   [place]: [id number]

## 2020-01-15 ENCOUNTER — Ambulatory Visit: Payer: 59

## 2020-02-05 ENCOUNTER — Encounter: Payer: Self-pay | Admitting: Nurse Practitioner

## 2020-02-05 ENCOUNTER — Telehealth (INDEPENDENT_AMBULATORY_CARE_PROVIDER_SITE_OTHER): Payer: 59 | Admitting: Nurse Practitioner

## 2020-02-05 ENCOUNTER — Other Ambulatory Visit: Payer: 59

## 2020-02-05 ENCOUNTER — Other Ambulatory Visit: Payer: Self-pay

## 2020-02-05 VITALS — Wt 190.0 lb

## 2020-02-05 DIAGNOSIS — E876 Hypokalemia: Secondary | ICD-10-CM | POA: Diagnosis not present

## 2020-02-05 DIAGNOSIS — K921 Melena: Secondary | ICD-10-CM | POA: Diagnosis not present

## 2020-02-05 DIAGNOSIS — E039 Hypothyroidism, unspecified: Secondary | ICD-10-CM | POA: Diagnosis not present

## 2020-02-05 DIAGNOSIS — R198 Other specified symptoms and signs involving the digestive system and abdomen: Secondary | ICD-10-CM | POA: Diagnosis not present

## 2020-02-05 LAB — COMPREHENSIVE METABOLIC PANEL
ALT: 14 U/L (ref 0–35)
AST: 19 U/L (ref 0–37)
Albumin: 4.3 g/dL (ref 3.5–5.2)
Alkaline Phosphatase: 65 U/L (ref 39–117)
BUN: 12 mg/dL (ref 6–23)
CO2: 25 mEq/L (ref 19–32)
Calcium: 9.4 mg/dL (ref 8.4–10.5)
Chloride: 102 mEq/L (ref 96–112)
Creatinine, Ser: 0.84 mg/dL (ref 0.40–1.20)
GFR: 69.65 mL/min (ref >60.00)
Glucose, Bld: 117 mg/dL — ABNORMAL HIGH (ref 70–99)
Potassium: 3 mEq/L — ABNORMAL LOW (ref 3.5–5.1)
Sodium: 138 mEq/L (ref 135–145)
Total Bilirubin: 0.5 mg/dL (ref 0.2–1.2)
Total Protein: 7.8 g/dL (ref 6.0–8.3)

## 2020-02-05 LAB — TSH: TSH: 2 u[IU]/mL (ref 0.35–4.50)

## 2020-02-05 LAB — T4, FREE: Free T4: 1.03 ng/dL (ref 0.60–1.60)

## 2020-02-05 MED ORDER — POTASSIUM CHLORIDE 20 MEQ/15ML (10%) PO SOLN: 20.0000 meq | Freq: Two times a day (BID) | ORAL | 0 refills | Status: DC

## 2020-02-05 MED ORDER — POTASSIUM CHLORIDE CRYS ER 20 MEQ PO TBCR: 20.0000 meq | EXTENDED_RELEASE_TABLET | Freq: Two times a day (BID) | ORAL | 0 refills | Status: DC

## 2020-02-05 NOTE — Addendum Note (Signed)
Addended by: Lynnea Ferrier on: 02/05/2020 11:13 AM   Modules accepted: Orders

## 2020-02-05 NOTE — Progress Notes (Addendum)
Virtual Visit via Video Note  I connected with@ on 02/05/20 at  8:30 AM EDT by a video enabled telemedicine application and verified that I am speaking with the correct person using two identifiers.  Location: Patient:Home Provider: Office Participants: patient and provider  I discussed the limitations of evaluation and management by telemedicine and the availability of in person appointments. I also discussed with the patient that there may be a patient responsible charge related to this service. The patient expressed understanding and agreed to proceed.  CC:pt doesn't feel its a flare up of colitis//she said pattern 5 days diarrhea and a few days good and goes back//pt said she stays well hydrated, ongoing for 3weeks.  History of Present Illness: Diarrhea  This is a recurrent problem. The current episode started more than 1 month ago. The problem occurs less than 2 times per day. The problem has been waxing and waning. The stool consistency is described as watery and blood tinged. The patient states that diarrhea does not awaken her from sleep. Associated symptoms include bloating and increased flatus. Pertinent negatives include no abdominal pain, chills, coughing, fever, headaches, myalgias, sweats, URI, vomiting or weight loss. There are no known risk factors. She has tried anti-motility drug, increased fluids and electrolyte solution for the symptoms. The treatment provided mild relief. Her past medical history is significant for inflammatory bowel disease and irritable bowel syndrome.  able to tolerate oral fluids and food intake. Last CT ABD/pelvis 2017 Last appt with GI: 2019 Last oral abx use: 10/2019 (levaquin)  Reviewed medical history and medication list. Has an allergic to iodine contrast (hives)  Observations/Objective: Physical Exam  Constitutional: She is oriented to person, place, and time. No distress.  Pulmonary/Chest: Effort normal.  Abdominal: She exhibits no  distension. There is no abdominal tenderness.  Neurological: She is alert and oriented to person, place, and time.  Psychiatric: She has a normal mood and affect. Her behavior is normal. Thought content normal.  unable to provide any vital signs Limited exam due to video appt.  Assessment and Plan: [place] was seen today for diarrhea.  Diagnoses and all orders for this visit:  Alternating constipation and diarrhea -     Cancel: CBC w/Diff -     Comprehensive metabolic panel -     CT Abdomen Pelvis Wo Contrast; Future -     CBC with Differential/Platelet -     potassium chloride SA (KLOR-CON) 20 MEQ tablet; Take 1 tablet (20 mEq total) by mouth 2 (two) times daily.  Hypothyroidism, unspecified type -     TSH -     T4, free -     CBC with Differential/Platelet  Hematochezia -     Cancel: CBC w/Diff -     CT Abdomen Pelvis Wo Contrast; Future -     CBC with Differential/Platelet  Acute hypokalemia -     potassium chloride SA (KLOR-CON) 20 MEQ tablet; Take 1 tablet (20 mEq total) by mouth 2 (two) times daily.   Follow Up Instructions: Maintain adequate oral hydration and BRAT diet Schedule appt with Dr. Vena Rua office. Normal renal and liver function Stable thyroid function Low potassium due to diarrhea. Potassium supplement sent to your pharmacy. Proceed with CT You will be contacted to schedule appt for CT ABD/pelvis.   I discussed the assessment and treatment plan with the patient. The patient was provided an opportunity to ask questions and all were answered. The patient agreed with the plan and demonstrated an understanding of the  instructions.   The patient was advised to call back or seek an in-person evaluation if the symptoms worsen or if the condition fails to improve as anticipated.   Wilfred Lacy, NP

## 2020-02-05 NOTE — Patient Instructions (Addendum)
Maintain adequate oral hydration and BRAT diet Schedule appt with Dr. Vena Rua office. Normal renal and liver function Stable thyroid function Low potassium due to diarrhea. Potassium supplement sent to your pharmacy. Proceed with CT You will be contacted to schedule appt for CT ABD/pelvis.

## 2020-02-05 NOTE — Addendum Note (Signed)
Addended by: Wilfred Lacy L on: 02/05/2020 09:07 PM   Modules accepted: Orders

## 2020-02-05 NOTE — Addendum Note (Signed)
Addended by: Wilfred Lacy L on: 02/05/2020 01:07 PM   Modules accepted: Orders, Level of Service

## 2020-02-06 LAB — CBC WITH DIFFERENTIAL/PLATELET
Basophils Absolute: 0.1 10*3/uL (ref 0.0–0.2)
Basos: 1 %
EOS (ABSOLUTE): 0.3 10*3/uL (ref 0.0–0.4)
Eos: 5 %
Hematocrit: 39.3 % (ref 34.0–46.6)
Hemoglobin: 13.7 g/dL (ref 11.1–15.9)
Immature Grans (Abs): 0 10*3/uL (ref 0.0–0.1)
Immature Granulocytes: 0 %
Lymphocytes Absolute: 1.7 10*3/uL (ref 0.7–3.1)
Lymphs: 26 %
MCH: 29.1 pg (ref 26.6–33.0)
MCHC: 34.9 g/dL (ref 31.5–35.7)
MCV: 84 fL (ref 79–97)
Monocytes Absolute: 0.7 10*3/uL (ref 0.1–0.9)
Monocytes: 10 %
Neutrophils Absolute: 3.8 10*3/uL (ref 1.4–7.0)
Neutrophils: 58 %
Platelets: 335 10*3/uL (ref 150–450)
RBC: 4.7 x10E6/uL (ref 3.77–5.28)
RDW: 13.6 % (ref 11.7–15.4)
WBC: 6.6 10*3/uL (ref 3.4–10.8)

## 2020-02-08 ENCOUNTER — Ambulatory Visit: Payer: 59 | Attending: Internal Medicine

## 2020-02-08 DIAGNOSIS — Z23 Encounter for immunization: Secondary | ICD-10-CM

## 2020-02-08 NOTE — Progress Notes (Signed)
   Covid-19 Vaccination Clinic  Name:  Gina Wise    MRN: [id number] DOB: June 03, 1961  02/08/2020  [name] was observed post Covid-19 immunization for 15 minutes without incident. She was provided with Vaccine Information Sheet and instruction to access the V-Safe system.   [name] was instructed to call 911 with any severe reactions post vaccine: Marland Kitchen Difficulty breathing  . Swelling of face and throat  . A fast heartbeat  . A bad rash all over body  . Dizziness and weakness   Immunizations Administered    Name Date Dose VIS Date Route   Pfizer COVID-19 Vaccine 02/08/2020  3:09 PM 0.3 mL 12/16/2018 Intramuscular   Manufacturer: [place]   Lot: [id number]   [place]: [id number]

## 2020-02-10 ENCOUNTER — Other Ambulatory Visit: Payer: 59

## 2020-02-22 ENCOUNTER — Other Ambulatory Visit: Payer: Self-pay

## 2020-02-22 ENCOUNTER — Ambulatory Visit
Admission: RE | Admit: 2020-02-22 | Discharge: 2020-02-22 | Disposition: A | Discharge status: Home / Self Care | Payer: 59 | Source: Ambulatory Visit | Attending: Nurse Practitioner | Admitting: Nurse Practitioner

## 2020-02-22 DIAGNOSIS — K921 Melena: Secondary | ICD-10-CM

## 2020-02-22 DIAGNOSIS — R198 Other specified symptoms and signs involving the digestive system and abdomen: Secondary | ICD-10-CM

## 2020-02-23 ENCOUNTER — Encounter: Payer: Self-pay | Admitting: Nurse Practitioner

## 2020-02-23 NOTE — Telephone Encounter (Signed)
Please see message and advise.  Thank you. ° °

## 2020-02-25 ENCOUNTER — Other Ambulatory Visit: Payer: Self-pay

## 2020-02-25 ENCOUNTER — Encounter: Payer: Self-pay | Admitting: Nurse Practitioner

## 2020-02-25 DIAGNOSIS — K921 Melena: Secondary | ICD-10-CM

## 2020-02-25 DIAGNOSIS — K529 Noninfective gastroenteritis and colitis, unspecified: Secondary | ICD-10-CM

## 2020-02-25 DIAGNOSIS — E876 Hypokalemia: Secondary | ICD-10-CM

## 2020-02-25 NOTE — Telephone Encounter (Signed)
Orders entered

## 2020-02-26 ENCOUNTER — Other Ambulatory Visit (INDEPENDENT_AMBULATORY_CARE_PROVIDER_SITE_OTHER): Payer: 59

## 2020-02-26 DIAGNOSIS — K529 Noninfective gastroenteritis and colitis, unspecified: Secondary | ICD-10-CM | POA: Diagnosis not present

## 2020-02-26 DIAGNOSIS — E876 Hypokalemia: Secondary | ICD-10-CM

## 2020-02-26 DIAGNOSIS — K921 Melena: Secondary | ICD-10-CM

## 2020-02-26 NOTE — Addendum Note (Signed)
Addended by: Lynnea Ferrier on: 02/26/2020 07:30 AM   Modules accepted: Orders

## 2020-02-27 LAB — IRON,TIBC AND FERRITIN PANEL
Ferritin: 32 ng/mL (ref 15–150)
Iron Saturation: 24 % (ref 15–55)
Iron: 71 ug/dL (ref 27–159)
Total Iron Binding Capacity: 299 ug/dL (ref 250–450)
UIBC: 228 ug/dL (ref 131–425)

## 2020-02-27 LAB — BASIC METABOLIC PANEL
BUN/Creatinine Ratio: 15 (ref 9–23)
BUN: 12 mg/dL (ref 6–24)
CO2: 19 mmol/L — ABNORMAL LOW (ref 20–29)
Calcium: 9.2 mg/dL (ref 8.7–10.2)
Chloride: 105 mmol/L (ref 96–106)
Creatinine, Ser: 0.81 mg/dL (ref 0.57–1.00)
GFR calc Af Amer: 93 mL/min/{1.73_m2} (ref >59)
GFR calc non Af Amer: 81 mL/min/{1.73_m2} (ref >59)
Glucose: 113 mg/dL — ABNORMAL HIGH (ref 65–99)
Potassium: 3.7 mmol/L (ref 3.5–5.2)
Sodium: 139 mmol/L (ref 134–144)

## 2020-02-27 LAB — SEDIMENTATION RATE: Sed Rate: 16 mm/hr (ref 0–40)

## 2020-02-27 LAB — C-REACTIVE PROTEIN: CRP: 2 mg/L (ref 0–10)

## 2020-03-03 ENCOUNTER — Encounter: Payer: Self-pay | Admitting: Gastroenterology

## 2020-03-03 ENCOUNTER — Ambulatory Visit: Payer: 59 | Admitting: Gastroenterology

## 2020-03-03 ENCOUNTER — Encounter: Payer: Self-pay | Admitting: Internal Medicine

## 2020-03-03 VITALS — BP 132/88 | HR 100 | Temp 98.2°F | Ht 65.0 in | Wt 191.5 lb

## 2020-03-03 DIAGNOSIS — R197 Diarrhea, unspecified: Secondary | ICD-10-CM | POA: Diagnosis not present

## 2020-03-03 DIAGNOSIS — R194 Change in bowel habit: Secondary | ICD-10-CM

## 2020-03-03 DIAGNOSIS — K625 Hemorrhage of anus and rectum: Secondary | ICD-10-CM | POA: Diagnosis not present

## 2020-03-03 DIAGNOSIS — R933 Abnormal findings on diagnostic imaging of other parts of digestive tract: Secondary | ICD-10-CM | POA: Diagnosis not present

## 2020-03-03 MED ORDER — SUTAB 1479-225-188 MG PO TABS: 1.0000 | ORAL_TABLET | ORAL | 0 refills | Status: DC

## 2020-03-03 NOTE — Patient Instructions (Signed)
If you are age 58 or older, your body mass index should be between 23-30. Your Body mass index is 31.87 kg/m. If this is out of the aforementioned range listed, please consider follow up with your Primary Care Provider.  If you are age 23 or younger, your body mass index should be between 19-25. Your Body mass index is 31.87 kg/m. If this is out of the aformentioned range listed, please consider follow up with your Primary Care Provider.   You have been scheduled for a colonoscopy. Please follow written instructions given to you at your visit today.  Please pick up your prep supplies at the pharmacy within the next 1-3 days. If you use inhalers (even only as needed), please bring them with you on the day of your procedure. Your physician has requested that you go to www.startemmi.com and enter the access code given to you at your visit today. This web site gives a general overview about your procedure. However, you should still follow specific instructions given to you by our office regarding your preparation for the procedure.  We have sent the following medications to your pharmacy for you to pick up at your convenience: Sutab  Thank you for choosing me and [facility].  Alonza Bogus, PA-C

## 2020-03-03 NOTE — Progress Notes (Signed)
03/03/2020 Gina Wise [id number] 1961/06/10   HISTORY OF PRESENT ILLNESS:  This is a 59 year old female who is a patient of Dr. Vena Rua.  She is here today with complaints of change in bowel habits, alternating diarrhea and constipation, since she had Covid at the end of January.  She tells me that her stomach has just not been right since she has had Covid.  She reports several days of diarrhea describing 4-6 diarrheal bowel movements an hour alternating with then several days of constipation with no bowel movement at all within a several day period.  Reports lower abdominal cramping and rectal bleeding as well.  She had a negative colonoscopy in December 2018 as below.  Recent noncontrast CT scan showed thickening of the sigmoid colon.  Interestingly she had a CT scan with similar findings in 2017.  Extensive lab studies including CBC, BMP, hepatic function panel, TSH, sed rate, CRP, iron studies have been  Unremarkable.  She has Lomotil at home, but says that it does not really seem to work.  During the times of constipation the only thing that seems to help is milk of magnesia.  She also has Bentyl on her medication list, but says that she does not really feel like it helps and that she does not take it regularly because she is afraid that it will cause constipation again.  CT scan abdomen and pelvis W/O contrast on 02/23/2020 showed the following:  IMPRESSION: 1. Mild thickening of the sigmoid colon concerning for mild colitis. Clinical correlation is recommended. No bowel obstruction. 2. No hydronephrosis or nephrolithiasis.   09/2017 showed mild diverticulosis and small internal hemorrhoids.   Past Medical History:  Diagnosis Date  . 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 Allergy   . Anemia   . Bruising 05/10/2015  . CAP (community acquired pneumonia) 08/03/2015  . Chronic bronchitis ([place])    "get it q yr; in the spring time" (02/17/2014)  . COVID-19   . Depression    "have taken  zoloft since 1996" (02/17/2014)  . Diverticulosis   . Dyspnea    from scar tissue  . Epistaxis 12/01/2015  . Gastritis   . GERD (gastroesophageal reflux disease)   . Hematemesis with nausea 08/01/2016  . Hiatal hernia   . High cholesterol   . Hypertension   . Hypothyroid   . Internal hemorrhoids   . Migraines    "maybe a couple/yr" (02/17/2014)  . Multiple environmental allergies   . Overweight(278.02)   . Peri-menopause   . Schatzki's ring   . Sjogren's disease ([place])   . Systemic lupus ([place])    Past Surgical History:  Procedure Laterality Date  . APPENDECTOMY  1987  . ESOPHAGOGASTRODUODENOSCOPY N/A 02/28/2014   Procedure: ESOPHAGOGASTRODUODENOSCOPY (EGD);  Surgeon: Jerene Bears, MD;  Location: [facility] ENDOSCOPY;  Service: Endoscopy;  Laterality: N/A;  . ESOPHAGOGASTRODUODENOSCOPY N/A 08/02/2016   Procedure: ESOPHAGOGASTRODUODENOSCOPY (EGD);  Surgeon: Gatha Mayer, MD;  Location: [facility] ENDOSCOPY;  Service: Endoscopy;  Laterality: N/A;  . EXPLORATORY LAPAROTOMY  1987  . FOOT SURGERY Right ~1980   "don't know what they did; had to take something out"  . INGUINAL HERNIA REPAIR Right ~ 2003  . RADIOACTIVE SEED GUIDED EXCISIONAL BREAST BIOPSY Left 02/25/2018   Procedure: RADIOACTIVE SEED GUIDED EXCISIONAL BREAST BIOPSY;  Surgeon: Rolm Bookbinder, MD;  Location: [facility];  Service: General;  Laterality: Left;  . SHOULDER OPEN ROTATOR CUFF REPAIR Left 1995  . SHOULDER SURGERY Left 1997   "cartilage"  .  TONSILLECTOMY AND ADENOIDECTOMY  1976  . TUBAL LIGATION  1998   laparoscopic  . VAGINAL HYSTERECTOMY  04/08/2012   menorrhagia  . VAGINAL PROLAPSE REPAIR  08/04/2018   Anterior and posterior with colp-mesh-Dr. Mikle Bosworth @ [facility]    reports that she has never smoked. She has never used smokeless tobacco. She reports current alcohol use. She reports that she does not use drugs. family history includes Alcohol abuse in her father and maternal grandmother; Allergy  (severe) in her son; Aneurysm in her paternal grandfather; Arthritis in her sister; Breast cancer in her paternal aunt; Colon cancer in her maternal grandfather; Depression in her mother and sister; Early death in her father, maternal uncle, and paternal uncle; Emphysema in her father and paternal aunt; Endometriosis in her mother and sister; Hearing loss in her father and mother; Heart attack in her maternal uncle; Heart disease in her mother and paternal grandmother; Hepatitis in her mother; Hypertension in her father and mother; Lung cancer in her paternal uncle, paternal uncle, and paternal uncle; Lung disease in her paternal aunt; Lupus in her sister; Pancreatic disease in her mother; Parkinson's disease in her maternal grandmother; Uterine cancer in her maternal aunt; Vision loss in her father. Allergies  Allergen Reactions  . Contrast Media [Iodinated Diagnostic Agents] Hives and Itching  . Other Hives and Swelling    CATS AND CAT DANDER  . Codeine Nausea Only  . Erythromycin Nausea And Vomiting    Severe abdominal pain  . Iohexol Hives      Outpatient Encounter Medications as of 03/03/2020  Medication Sig  . acetaminophen (TYLENOL) 500 MG tablet Take 1 tablet (500 mg total) by mouth every 6 (six) hours as needed.  Marland Kitchen b complex vitamins capsule Take 1 capsule by mouth daily.  . Biotin 1000 MCG tablet Take by mouth.  . cetirizine (ZYRTEC) 10 MG tablet Take 10 mg by mouth daily.  Marland Kitchen dicyclomine (BENTYL) 20 MG tablet Take 20 mg by mouth 3 (three) times daily as needed.  . diphenoxylate-atropine (LOMOTIL) 2.5-0.025 MG tablet TAKE 1 TABLET BY MOUTH THREE TIMES DAILY AS NEEDED FOR DIARRHEA OR LOOSE STOOLS  . esomeprazole (NEXIUM) 40 MG capsule Take 1 capsule (40 mg total) by mouth 2 (two) times daily.  . hydrochlorothiazide (HYDRODIURIL) 25 MG tablet TAKE 1 TABLET(25 MG) BY MOUTH DAILY  . levothyroxine (SYNTHROID) 125 MCG tablet TAKE 1 TABLET(125 MCG) BY MOUTH DAILY  . loratadine (CLARITIN) 10  MG tablet Take by mouth.  . ondansetron (ZOFRAN) 8 MG tablet Take 1 tablet (8 mg total) by mouth every 8 (eight) hours as needed for nausea, vomiting or refractory nausea / vomiting.  . potassium chloride 20 MEQ/15ML (10%) SOLN Take 15 mLs (20 mEq total) by mouth 2 (two) times daily.  . potassium chloride SA (KLOR-CON) 20 MEQ tablet Take 1 tablet (20 mEq total) by mouth 2 (two) times daily.  . sertraline (ZOLOFT) 100 MG tablet Take 1/2 (one-half) tablet by mouth once daily  . SUMAtriptan (IMITREX) 100 MG tablet May repeat in 2 hours if headache persists(Plz sched with new provider)  . triamcinolone (NASACORT) 55 MCG/ACT AERO nasal inhaler Place 2 sprays into the nose daily.  . Vitamin D, Cholecalciferol, 400 UNITS TABS Take 400 Units by mouth daily.   No facility-administered encounter medications on file as of 03/03/2020.   REVIEW OF SYSTEMS  : All other systems reviewed and negative except where noted in the History of Present Illness.   PHYSICAL EXAM: Temp 98.2 F (36.8  C)   Ht 5\' 5"  (1.651 m) Comment: height measured without shoes  Wt 191 lb 8 oz (86.9 kg)   LMP 04/03/2012 Comment: Still has Cervix and Ovaries  BMI 31.87 kg/m  General: Well developed white female in no acute distress Head: Normocephalic and atraumatic Eyes:  Sclerae anicteric, conjunctiva pink. Ears: Normal auditory acuity Lungs: Clear throughout to auscultation; no increased WOB Heart: Regular rate and rhythm; no M/R/G. Abdomen: Soft, non-distended.  BS present.  TTP mostly on the left. Musculoskeletal: Symmetrical with no gross deformities  Skin: No lesions on visible extremities Extremities: No edema  Neurological: Alert oriented x 4, grossly non-focal Psychological:  Alert and cooperative. Normal mood and affect  ASSESSMENT AND PLAN: *58 year old female with change in bowel habits, alternating diarrhea and constipation, since she had Covid at the end of January.  She reports several days of diarrhea  describing 4-6 diarrheal bowel movements an hour alternating with then several days of constipation with no bowel movement at all within a several day period.  Reports lower abdominal cramping and rectal bleeding as well.  She had a negative colonoscopy in December 2018.  Recent noncontrast CT scan showed thickening of the sigmoid colon.  Interestingly she had a CT scan with similar findings in 2017.  Extensive lab studies unremarkable.  Not quite sure what is going on at this point.  I do not think that stool studies for infectious source will be helpful at this point.  I think we probably need to plan for repeat colonoscopy.  She is being scheduled with Dr. Hilarie Fredrickson.  In the interim she will continue to manage her symptoms with the medications she already has at home including Lomotil as needed, milk of magnesia as needed, Bentyl, etc.  Advised to try to remain hydrated.  The risks, benefits, and alternatives to colonoscopy were discussed with the patient and she consents to proceed.    CC:  Lucille Passy, MD

## 2020-03-07 ENCOUNTER — Other Ambulatory Visit: Payer: Self-pay | Admitting: Internal Medicine

## 2020-03-15 ENCOUNTER — Other Ambulatory Visit: Payer: Self-pay

## 2020-03-15 ENCOUNTER — Ambulatory Visit (AMBULATORY_SURGERY_CENTER): Payer: 59 | Admitting: Internal Medicine

## 2020-03-15 ENCOUNTER — Encounter: Payer: Self-pay | Admitting: Internal Medicine

## 2020-03-15 VITALS — BP 146/64 | HR 73 | Temp 97.3°F | Resp 16 | Ht 65.0 in | Wt 191.0 lb

## 2020-03-15 DIAGNOSIS — R194 Change in bowel habit: Secondary | ICD-10-CM | POA: Diagnosis present

## 2020-03-15 DIAGNOSIS — K5730 Diverticulosis of large intestine without perforation or abscess without bleeding: Secondary | ICD-10-CM | POA: Diagnosis not present

## 2020-03-15 DIAGNOSIS — K5150 Left sided colitis without complications: Secondary | ICD-10-CM | POA: Diagnosis not present

## 2020-03-15 DIAGNOSIS — R197 Diarrhea, unspecified: Secondary | ICD-10-CM | POA: Diagnosis not present

## 2020-03-15 DIAGNOSIS — K648 Other hemorrhoids: Secondary | ICD-10-CM

## 2020-03-15 MED ORDER — DIPHENOXYLATE-ATROPINE 2.5-0.025 MG PO TABS: 1.0000 | ORAL_TABLET | Freq: Four times a day (QID) | ORAL | 1 refills | Status: DC | PRN

## 2020-03-15 MED ORDER — SODIUM CHLORIDE 0.9 % IV SOLN: 500.0000 mL | Freq: Once | INTRAVENOUS | Status: DC

## 2020-03-15 NOTE — Progress Notes (Signed)
VS by DT    

## 2020-03-15 NOTE — Progress Notes (Signed)
Called to room to assist during endoscopic procedure.  Patient ID and intended procedure confirmed with present staff. Received instructions for my participation in the procedure from the performing physician.  

## 2020-03-15 NOTE — Progress Notes (Signed)
pt tolerated well. VSS. awake and to recovery. Report given to RN.  

## 2020-03-15 NOTE — Progress Notes (Signed)
Addendum: Reviewed and agree with assessment and management plan. Demetrias Goodbar M, MD  

## 2020-03-15 NOTE — Op Note (Signed)
[facility] Patient Name: Gina Wise Procedure Date: 03/15/2020 2:11 PM MRN: [id number] Endoscopist: Jerene Bears , MD Age: 59 Referring MD:  Date of Birth: 06/05/1961 Gender: Female Account #: [id number] Procedure:                Colonoscopy Indications:              Clinically significant diarrhea of unexplained                            origin, Change in bowel habits particularly after                            COVID-19 infection in Jan 2021 Medicines:                Monitored Anesthesia Care Procedure:                Pre-Anesthesia Assessment:                           - Prior to the procedure, a History and Physical                            was performed, and patient medications and                            allergies were reviewed. The patient's tolerance of                            previous anesthesia was also reviewed. The risks                            and benefits of the procedure and the sedation                            options and risks were discussed with the patient.                            All questions were answered, and informed consent                            was obtained. Prior Anticoagulants: The patient has                            taken no previous anticoagulant or antiplatelet                            agents. ASA Grade Assessment: III - A patient with                            severe systemic disease. After reviewing the risks                            and benefits, the patient was deemed in  satisfactory condition to undergo the procedure.                           After obtaining informed consent, the colonoscope                            was passed under direct vision. Throughout the                            procedure, the patient's blood pressure, pulse, and                            oxygen saturations were monitored continuously. The                            Colonoscope was introduced through  the anus and                            advanced to the cecum, identified by the                            appendiceal orifice. The colonoscopy was performed                            without difficulty. The patient tolerated the                            procedure well. The quality of the bowel                            preparation was good. The ileocecal valve,                            appendiceal orifice, and rectum were photographed. Scope In: 2:29:17 PM Scope Out: 2:45:10 PM Scope Withdrawal Time: 0 hours 11 minutes 29 seconds  Total Procedure Duration: 0 hours 15 minutes 53 seconds  Findings:                 The digital rectal exam was normal.                           A diffuse area of mildly erythematous mucosa was                            found in the recto-sigmoid colon, in the sigmoid                            colon and in the descending colon. This was                            biopsied with a cold forceps for histology.                           Normal mucosa was found in the transverse colon, at  the hepatic flexure, in the ascending colon and in                            the cecum. Biopsies were taken with a cold forceps                            for histology.                           Multiple small and large-mouthed diverticula were                            found in the sigmoid colon, transverse colon and                            ascending colon.                           Internal hemorrhoids were found during                            retroflexion. The hemorrhoids were small. Complications:            No immediate complications. Estimated Blood Loss:     Estimated blood loss was minimal. Impression:               - Erythematous mucosa in the recto-sigmoid colon,                            in the sigmoid colon and in the descending colon.                            Biopsied.                           - Normal mucosa in the  transverse colon, at the                            hepatic flexure, in the ascending colon and in the                            cecum. Biopsied.                           - Diverticulosis in the sigmoid colon, in the                            transverse colon and in the ascending colon.                           - Small internal hemorrhoids. Recommendation:           - Patient has a contact number available for                            emergencies. The signs and symptoms of potential  delayed complications were discussed with the                            patient. Return to normal activities tomorrow.                            Written discharge instructions were provided to the                            patient.                           - Resume previous diet.                           - Continue present medications.                           - Await pathology results.                           - Repeat colonoscopy in 10 years for screening                            purposes (please change previous recall to new                            repeat screening date, May 2031). Jerene Bears, MD 03/15/2020 2:55:31 PM This report has been signed electronically.

## 2020-03-15 NOTE — Patient Instructions (Signed)
Handouts on hemorrhoids and diverticulosis given to you today  Await pathology results    YOU HAD AN ENDOSCOPIC PROCEDURE TODAY AT [facility]:   Refer to the procedure report that was given to you for any specific questions about what was found during the examination.  If the procedure report does not answer your questions, please call your gastroenterologist to clarify.  If you requested that your care partner not be given the details of your procedure findings, then the procedure report has been included in a sealed envelope for you to review at your convenience later.  YOU SHOULD EXPECT: Some feelings of bloating in the abdomen. Passage of more gas than usual.  Walking can help get rid of the air that was put into your GI tract during the procedure and reduce the bloating. If you had a lower endoscopy (such as a colonoscopy or flexible sigmoidoscopy) you may notice spotting of blood in your stool or on the toilet paper. If you underwent a bowel prep for your procedure, you may not have a normal bowel movement for a few days.  Please Note:  You might notice some irritation and congestion in your nose or some drainage.  This is from the oxygen used during your procedure.  There is no need for concern and it should clear up in a day or so.  SYMPTOMS TO REPORT IMMEDIATELY:   Following lower endoscopy (colonoscopy or flexible sigmoidoscopy):  Excessive amounts of blood in the stool  Significant tenderness or worsening of abdominal pains  Swelling of the abdomen that is new, acute  Fever of 100F or higher   For urgent or emergent issues, a gastroenterologist can be reached at any hour by calling [phone]. Do not use MyChart messaging for urgent concerns.    DIET:  We do recommend a small meal at first, but then you may proceed to your regular diet.  Drink plenty of fluids but you should avoid alcoholic beverages for 24 hours.  ACTIVITY:  You should plan to take it easy  for the rest of today and you should NOT DRIVE or use heavy machinery until tomorrow (because of the sedation medicines used during the test).    FOLLOW UP: Our staff will call the number listed on your records 48-72 hours following your procedure to check on you and address any questions or concerns that you may have regarding the information given to you following your procedure. If we do not reach you, we will leave a message.  We will attempt to reach you two times.  During this call, we will ask if you have developed any symptoms of COVID 19. If you develop any symptoms (ie: fever, flu-like symptoms, shortness of breath, cough etc.) before then, please call [phone].  If you test positive for Covid 19 in the 2 weeks post procedure, please call and report this information to [place].    If any biopsies were taken you will be contacted by phone or by letter within the next 1-3 weeks.  Please call us at [phone] if you have not heard about the biopsies in 3 weeks.    SIGNATURES/CONFIDENTIALITY: You and/or your care partner have signed paperwork which will be entered into your electronic medical record.  These signatures attest to the fact that that the information above on your After Visit Summary has been reviewed and is understood.  Full responsibility of the confidentiality of this discharge information lies with you and/or your care-partner.

## 2020-03-17 ENCOUNTER — Telehealth: Payer: Self-pay

## 2020-03-17 NOTE — Telephone Encounter (Signed)
  Follow up Call-  Call back number 03/15/2020 01/17/2018 09/23/2017  Post procedure Call Back phone  # [phone]  Permission to leave phone message Yes Yes Yes  Some recent data might be hidden     Patient questions:  Do you have a fever, pain , or abdominal swelling? No. Pain Score  0 *  Have you tolerated food without any problems? Yes.    Have you been able to return to your normal activities? Yes.    Do you have any questions about your discharge instructions: Diet   No. Medications  No. Follow up visit  No.  Do you have questions or concerns about your Care? No.  Actions: * If pain score is 4 or above: No action needed, pain <4. 1. Have you developed a fever since your procedure? no  2.   Have you had an respiratory symptoms (SOB or cough) since your procedure? no  3.   Have you tested positive for COVID 19 since your procedure no  4.   Have you had any family members/close contacts diagnosed with the COVID 19 since your procedure?  no   If yes to any of these questions please route to Joylene John, RN and Erenest Rasher, RN

## 2020-03-23 ENCOUNTER — Telehealth: Payer: Self-pay | Admitting: Internal Medicine

## 2020-03-23 ENCOUNTER — Other Ambulatory Visit: Payer: Self-pay

## 2020-03-23 NOTE — Telephone Encounter (Signed)
Pt calling for colon results. States she is still having the same symptoms and wants to know what the next step will be for her plan of care. Please advise.

## 2020-03-25 ENCOUNTER — Telehealth (INDEPENDENT_AMBULATORY_CARE_PROVIDER_SITE_OTHER): Payer: 59 | Admitting: Family Medicine

## 2020-03-25 ENCOUNTER — Other Ambulatory Visit: Payer: Self-pay

## 2020-03-25 ENCOUNTER — Encounter: Payer: Self-pay | Admitting: Family Medicine

## 2020-03-25 VITALS — Temp 96.2°F | Ht 65.0 in | Wt 189.0 lb

## 2020-03-25 DIAGNOSIS — R198 Other specified symptoms and signs involving the digestive system and abdomen: Secondary | ICD-10-CM

## 2020-03-25 DIAGNOSIS — Z8616 Personal history of COVID-19: Secondary | ICD-10-CM | POA: Diagnosis not present

## 2020-03-25 NOTE — Progress Notes (Signed)
Virtual Visit via Telephone Note  I connected with [name] on 03/25/20 at 11:30 AM EDT by telephone and verified that I am speaking with the correct person using two identifiers.   I discussed the limitations, risks, security and privacy concerns of performing an evaluation and management service by telephone and the availability of in person appointments. I also discussed with the patient that there may be a patient responsible charge related to this service. The patient expressed understanding and agreed to proceed.  Location patient: home Location provider: work  Participants present for the call: patient, provider Patient did not have a visit in the prior 7 days to address this/these issue(s).  Chief Complaint  Patient presents with  . Acute Visit     Pt c/o having constipation and diarrhea, chills, vomiting x 6 months worsening and discuss disability paperwork     History of Present Illness: Gina Wise is a 59 y.o. female who was previously seen by Dr. Deborra Medina. She needs STD form completed as she has been out of work due n/v/d/c, abd pain since 03/02/20. Pt works as a Orthoptist for [employer].  Pt complains of nausea, alternating diarrhea and constipation every few days.  Symptoms began in late Jan or early Feb 2021. Pt had COVID in 10/2019. She feels current symptoms are due to COVID infection. She has seen Dr. Hilarie Fredrickson ([facility]), had normal CBC, sed rate, CRP. Potassium had been low, repleted, and normal on recheck. Labs reviewed by me today. Pt had colonoscopy on 03/15/20- erythematous mucosa in recto-sigmoid, sigmoid, descending colon. She has not heard regarding biopsy results yet.  Pt tried Activa yogurt and Align probiotic but felt it was not helpful.  She does not feel meds - zofran, bentyl, lomotil - have not been effective.    Lab Results  Component Value Date   CRP 2 02/26/2020   Lab Results  Component Value Date   ESRSEDRATE 16 02/26/2020   POCTSEDRATE 1 05/30/2015   Lab  Results  Component Value Date   NA 139 02/26/2020   K 3.7 02/26/2020   CL 105 02/26/2020   CO2 19 (L) 02/26/2020   Lab Results  Component Value Date   BUN 12 02/26/2020   Lab Results  Component Value Date   CREATININE 0.81 02/26/2020   Lab Results  Component Value Date   WBC 6.6 02/05/2020   HGB 13.7 02/05/2020   HCT 39.3 02/05/2020   MCV 84 02/05/2020   PLT 335 02/05/2020      Past Medical History:  Diagnosis Date  . 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 Allergy   . Anemia   . Bruising 05/10/2015  . CAP (community acquired pneumonia) 08/03/2015  . Chronic bronchitis ([place])    "get it q yr; in the spring time" (02/17/2014)  . COVID-19   . Depression    "have taken zoloft since 1996" (02/17/2014)  . Diverticulosis   . Dyspnea    from scar tissue  . Epistaxis 12/01/2015  . Gastritis   . GERD (gastroesophageal reflux disease)   . Hematemesis with nausea 08/01/2016  . Hiatal hernia   . High cholesterol   . Hypertension   . Hypothyroid   . Internal hemorrhoids   . Migraines    "maybe a couple/yr" (02/17/2014)  . Multiple environmental allergies   . Overweight(278.02)   . Peri-menopause   . Schatzki's ring   . Sjogren's disease ([place])   . Systemic lupus ([place])     Past Surgical History:  Procedure Laterality  Date  . APPENDECTOMY  1987  . ESOPHAGOGASTRODUODENOSCOPY N/A 02/28/2014   Procedure: ESOPHAGOGASTRODUODENOSCOPY (EGD);  Surgeon: Jerene Bears, MD;  Location: [facility] ENDOSCOPY;  Service: Endoscopy;  Laterality: N/A;  . ESOPHAGOGASTRODUODENOSCOPY N/A 08/02/2016   Procedure: ESOPHAGOGASTRODUODENOSCOPY (EGD);  Surgeon: Gatha Mayer, MD;  Location: [facility] ENDOSCOPY;  Service: Endoscopy;  Laterality: N/A;  . EXPLORATORY LAPAROTOMY  1987  . FOOT SURGERY Right ~1980   "don't know what they did; had to take something out"  . INGUINAL HERNIA REPAIR Right ~ 2003  . RADIOACTIVE SEED GUIDED EXCISIONAL BREAST BIOPSY Left 02/25/2018   Procedure: RADIOACTIVE SEED GUIDED EXCISIONAL  BREAST BIOPSY;  Surgeon: Rolm Bookbinder, MD;  Location: [place];  Service: General;  Laterality: Left;  . SHOULDER OPEN ROTATOR CUFF REPAIR Left 1995  . SHOULDER SURGERY Left 1997   "cartilage"  . TONSILLECTOMY AND ADENOIDECTOMY  1976  . TUBAL LIGATION  1998   laparoscopic  . VAGINAL HYSTERECTOMY  04/08/2012   menorrhagia  . VAGINAL PROLAPSE REPAIR  08/04/2018   Anterior and posterior with colp-mesh-Dr. Mikle Bosworth @ [facility]    Social History   Tobacco Use  . Smoking status: Never Smoker  . Smokeless tobacco: Never Used  Substance Use Topics  . Alcohol use: Yes    Alcohol/week: 0.0 standard drinks    Comment: socially  . Drug use: No    Family History  Problem Relation Age of Onset  . Hypertension Father   . Vision loss Father   . Alcohol abuse Father   . Emphysema Father        Smoker  . Hearing loss Father   . Early death Father        Murdered  . Hypertension Mother   . Heart disease Mother   . Hepatitis Mother        Hep C  . Endometriosis Mother   . Pancreatic disease Mother   . Depression Mother   . Hearing loss Mother   . Lupus Sister   . Endometriosis Sister   . Arthritis Sister   . Depression Sister   . Breast cancer Paternal Aunt        76's  . Lung cancer Paternal Uncle        smoker  . Colon cancer Maternal Grandfather   . Uterine cancer Maternal Aunt        50's  . Emphysema Paternal Aunt        Non-smoker/Died of Pneumonia  . Lung cancer Paternal Uncle   . Heart attack Maternal Uncle   . Parkinson's disease Maternal Grandmother   . Alcohol abuse Maternal Grandmother   . Heart disease Paternal Grandmother        Died during angioplasty surgery  . Aneurysm Paternal Grandfather   . Allergy (severe) Son        ASA-Idiopathic Thrombocytopenia  . Early death Maternal Uncle        Murdered  . Lung disease Paternal Aunt        Bronchiecstasis  . Lung cancer Paternal Uncle   . Early death Paternal Uncle         Carbon Monoxide Poisoning/Accidental  . Stomach cancer Neg Hx   . Pancreatic cancer Neg Hx     Outpatient Encounter Medications as of 03/25/2020  Medication Sig  . cetirizine (ZYRTEC) 10 MG tablet Take 10 mg by mouth daily.  Marland Kitchen dicyclomine (BENTYL) 20 MG tablet TAKE 1 TABLET BY MOUTH THREE TIMES DAILY AS NEEDED FOR SPASMS  .  diphenoxylate-atropine (LOMOTIL) 2.5-0.025 MG tablet Take 1 tablet by mouth 4 (four) times daily as needed for diarrhea or loose stools (1-2 tablets).  Marland Kitchen esomeprazole (NEXIUM) 40 MG capsule Take 1 capsule (40 mg total) by mouth 2 (two) times daily.  . hydrochlorothiazide (HYDRODIURIL) 25 MG tablet TAKE 1 TABLET(25 MG) BY MOUTH DAILY  . levothyroxine (SYNTHROID) 125 MCG tablet TAKE 1 TABLET(125 MCG) BY MOUTH DAILY  . ondansetron (ZOFRAN) 8 MG tablet Take 1 tablet (8 mg total) by mouth every 8 (eight) hours as needed for nausea, vomiting or refractory nausea / vomiting.  . sertraline (ZOLOFT) 100 MG tablet Take 1/2 (one-half) tablet by mouth once daily  . SUMAtriptan (IMITREX) 100 MG tablet May repeat in 2 hours if headache persists(Plz sched with new provider)   No facility-administered encounter medications on file as of 03/25/2020.     Allergies  Allergen Reactions  . Contrast Media [Iodinated Diagnostic Agents] Hives and Itching  . Other Hives and Swelling    CATS AND CAT DANDER  . Codeine Nausea Only  . Erythromycin Nausea And Vomiting    Severe abdominal pain  . Iohexol Hives    ROS: See pertinent positives and negatives per HPI.   Observations/Objective: Patient sounds fatigued and frustrated  I do not appreciate any SOB. Speech and thought processing are grossly intact. Patient reported vitals:  Temp (!) 96.2 F (35.7 C) (Temporal)   Ht 5\' 5"  (1.651 m)   Wt 189 lb (85.7 kg)   LMP 04/03/2012 Comment: Still has Cervix and Ovaries  BMI 31.45 kg/m    Assessment and Plan: 1. Personal history of covid-19 2. Alternating constipation and diarrhea -  agreed to complete STD form for pt, will fax to number indicated and scan copy into pts chart - I reviewed labs done 4/16/20201 and 02/26/2020 as well as colonoscopy report from 03/15/2020 and pathology report which notes "colonic mucosa with edema and hyperemia" and then "focal, mild active inflammation" - symptoms, based on timing of onset, could certainly be post-infectious - recommended pt restart probiotic, continue to adequate hydration, PRN meds - will route this to Dr. Hilarie Fredrickson as Juluis Rainier and so he or his staff can f/u with pt regarding biopsy result and plan of care moving forward   I did not refer this patient for an OV in the next 24 hours for this/these issue(s).  I discussed the assessment and treatment plan with the patient. The patient was provided an opportunity to ask questions and all were answered. The patient agreed with the plan and demonstrated an understanding of the instructions.   The patient was advised to call back or seek an in-person evaluation if the symptoms worsen or if the condition fails to improve as anticipated.  I provided 21 minutes of non-face-to-face time during this encounter.   Letta Median, DO

## 2020-03-25 NOTE — Telephone Encounter (Signed)
Patient is calling again to follow up about the below. She has called a few times this week to follow up and she is upset she has not heard back yet. States she feels like she is constantly going to the bathroom. Would like to speak with you. She states to please call after 12:30 as she has a doctors appoinment this morning.

## 2020-03-25 NOTE — Telephone Encounter (Signed)
Left message for pt that Dr. Hilarie Fredrickson is not in the office today and was not in the office yesterday. Let her know as soon as I hear back from the doctor she will be called, apologized for the delay.

## 2020-03-27 NOTE — Telephone Encounter (Signed)
See result note for plan. ?

## 2020-03-27 NOTE — Telephone Encounter (Signed)
See result note from colonoscopy pathology Thanks JMP

## 2020-03-28 ENCOUNTER — Other Ambulatory Visit: Payer: Self-pay

## 2020-03-28 DIAGNOSIS — R194 Change in bowel habit: Secondary | ICD-10-CM

## 2020-03-28 DIAGNOSIS — R197 Diarrhea, unspecified: Secondary | ICD-10-CM

## 2020-03-28 MED ORDER — RIFAXIMIN 550 MG PO TABS
550.0000 mg | ORAL_TABLET | Freq: Three times a day (TID) | ORAL | 0 refills | Status: DC
Start: 2020-03-28 — End: 2020-07-08

## 2020-03-28 NOTE — Telephone Encounter (Signed)
Pt notified via mychart

## 2020-03-29 ENCOUNTER — Other Ambulatory Visit: Payer: 59

## 2020-03-29 ENCOUNTER — Telehealth: Payer: Self-pay | Admitting: Internal Medicine

## 2020-03-29 NOTE — Telephone Encounter (Signed)
Spoke with pt and reviewed entire mychart message with pt over the phone and she is aware. She will come for labs and she knows to take lomotil until she is able to start the xifaxan.

## 2020-03-31 ENCOUNTER — Other Ambulatory Visit: Payer: Self-pay

## 2020-03-31 ENCOUNTER — Other Ambulatory Visit: Payer: 59

## 2020-03-31 DIAGNOSIS — R197 Diarrhea, unspecified: Secondary | ICD-10-CM

## 2020-03-31 DIAGNOSIS — R194 Change in bowel habit: Secondary | ICD-10-CM

## 2020-04-02 LAB — CLOSTRIDIUM DIFFICILE BY PCR: Toxigenic C. Difficile by PCR: POSITIVE — AB

## 2020-04-04 ENCOUNTER — Other Ambulatory Visit: Payer: Self-pay

## 2020-04-04 DIAGNOSIS — R197 Diarrhea, unspecified: Secondary | ICD-10-CM

## 2020-04-04 MED ORDER — VANCOMYCIN HCL 125 MG PO CAPS: 125.0000 mg | ORAL_CAPSULE | Freq: Four times a day (QID) | ORAL | 0 refills | Status: AC

## 2020-04-04 NOTE — Telephone Encounter (Signed)
See result note for stool studies, C. difficile is positive She will not need rifaximin I will begin treatment for C. difficile

## 2020-04-05 ENCOUNTER — Encounter: Payer: Self-pay | Admitting: Family Medicine

## 2020-04-06 LAB — STOOL CULTURE
MICRO NUMBER:: 10576458
MICRO NUMBER:: 10576459
MICRO NUMBER:: 10576460
SHIGA RESULT:: NOT DETECTED
SPECIMEN QUALITY:: ADEQUATE
SPECIMEN QUALITY:: ADEQUATE
SPECIMEN QUALITY:: ADEQUATE

## 2020-04-06 LAB — OVA AND PARASITE EXAMINATION
CONCENTRATE RESULT:: NONE SEEN
MICRO NUMBER:: 10576445
SPECIMEN QUALITY:: ADEQUATE
TRICHROME RESULT:: NONE SEEN

## 2020-04-07 ENCOUNTER — Encounter: Payer: Self-pay | Admitting: Nurse Practitioner

## 2020-04-08 ENCOUNTER — Encounter: Payer: Self-pay | Admitting: Family Medicine

## 2020-04-11 NOTE — Telephone Encounter (Signed)
Patient called to check on this paperwork. She says it is an emergency as she needs this in order to get paid by the [employer]. She states she has not been paid in 5 weeks already.

## 2020-04-11 NOTE — Telephone Encounter (Signed)
Please see message . Thank you .

## 2020-04-11 NOTE — Telephone Encounter (Signed)
Please see other Mychart message.

## 2020-04-12 ENCOUNTER — Telehealth: Payer: Self-pay | Admitting: Family Medicine

## 2020-04-12 NOTE — Telephone Encounter (Signed)
Patient needs return to work letter for Monday 04/18/20 and would like to pick up on Wednesday 04/13/20.

## 2020-04-12 NOTE — Telephone Encounter (Signed)
Sent pt a Therapist, music.  Need to know what type of letter pt is asking for.

## 2020-04-13 NOTE — Telephone Encounter (Signed)
Ok to write work note? 

## 2020-04-18 ENCOUNTER — Other Ambulatory Visit: Payer: Self-pay

## 2020-04-18 ENCOUNTER — Telehealth: Payer: Self-pay | Admitting: *Deleted

## 2020-04-18 MED ORDER — HYDROCHLOROTHIAZIDE 25 MG PO TABS: ORAL_TABLET | ORAL | 1 refills | Status: DC

## 2020-04-18 MED ORDER — LEVOTHYROXINE SODIUM 125 MCG PO TABS: ORAL_TABLET | ORAL | 1 refills | Status: DC

## 2020-04-18 NOTE — Telephone Encounter (Signed)
Last televisit 03/25/20 Last fill for both meds 07/06/19  #90/1

## 2020-04-18 NOTE — Telephone Encounter (Signed)
PA request sent through [email or web address].    Patient with GERD and schatzki ring.  She has previously tried and failed: omeprazole 20 mg Omeprazole 40 mg Pantoprazole 40 mg  We will await response.

## 2020-04-26 ENCOUNTER — Other Ambulatory Visit: Payer: Self-pay

## 2020-04-26 ENCOUNTER — Telehealth: Payer: Self-pay | Admitting: Family Medicine

## 2020-04-26 ENCOUNTER — Telehealth: Payer: Self-pay | Admitting: Gastroenterology

## 2020-04-26 DIAGNOSIS — R197 Diarrhea, unspecified: Secondary | ICD-10-CM

## 2020-04-26 MED ORDER — VANCOMYCIN HCL 250 MG PO CAPS: 250.0000 mg | ORAL_CAPSULE | Freq: Four times a day (QID) | ORAL | 0 refills | Status: DC

## 2020-04-26 NOTE — Telephone Encounter (Signed)
Pt states she finished her vancomycin last Monday and felt good until  The weekend. States she has started having diarrhea again with bloody mucous. Pt states she usually runs lower temp that normal and today her temp is 99. Pt states she is also having abd pain again. Please advise.

## 2020-04-26 NOTE — Telephone Encounter (Signed)
Spoke with pt and she is aware. Order in for lab, script sent to the pharmacy.

## 2020-04-26 NOTE — Telephone Encounter (Signed)
Please repeat C. difficile PCR, send stat Once submitted she can resume vancomycin this time 250 mg 4 times daily x14 days If recurrence after retreatment then she will need to see ID, Dr. Baxter Flattery Continue Florastor 250 mg twice daily

## 2020-04-26 NOTE — Telephone Encounter (Signed)
Patient is calling and stated that her job faxed over [facility] paperwork for Dr. Bryan Lemma to fill out and fax back. Patient has a TOC appointment with Dr. Loletha Grayer on 7/16, please advise. CB is [phone]

## 2020-04-27 ENCOUNTER — Other Ambulatory Visit: Payer: 59

## 2020-04-27 ENCOUNTER — Encounter: Payer: Self-pay | Admitting: Family Medicine

## 2020-04-29 ENCOUNTER — Other Ambulatory Visit: Payer: 59

## 2020-04-29 DIAGNOSIS — R197 Diarrhea, unspecified: Secondary | ICD-10-CM

## 2020-05-03 ENCOUNTER — Other Ambulatory Visit: Payer: Self-pay

## 2020-05-03 DIAGNOSIS — A0472 Enterocolitis due to Clostridium difficile, not specified as recurrent: Secondary | ICD-10-CM

## 2020-05-03 LAB — CLOSTRIDIUM DIFFICILE BY PCR: Toxigenic C. Difficile by PCR: POSITIVE — AB

## 2020-05-03 NOTE — Telephone Encounter (Signed)
Please see message and advise.  Thank you. ° °

## 2020-05-04 ENCOUNTER — Encounter: Payer: Self-pay | Admitting: Family Medicine

## 2020-05-04 NOTE — Telephone Encounter (Signed)
Please see message . Thank you .

## 2020-05-05 NOTE — Telephone Encounter (Signed)
Please see message and advise.  Thank you. ° °

## 2020-05-06 ENCOUNTER — Encounter: Payer: 59 | Admitting: Family Medicine

## 2020-05-11 ENCOUNTER — Encounter: Payer: Self-pay | Admitting: Family Medicine

## 2020-05-18 ENCOUNTER — Other Ambulatory Visit: Payer: Self-pay

## 2020-05-18 ENCOUNTER — Telehealth: Payer: Self-pay | Admitting: Pharmacy Technician

## 2020-05-18 ENCOUNTER — Encounter: Payer: Self-pay | Admitting: Internal Medicine

## 2020-05-18 ENCOUNTER — Ambulatory Visit: Payer: 59 | Admitting: Internal Medicine

## 2020-05-18 VITALS — BP 120/82 | HR 104 | Temp 98.0°F | Wt 196.0 lb

## 2020-05-18 DIAGNOSIS — A0471 Enterocolitis due to Clostridium difficile, recurrent: Secondary | ICD-10-CM

## 2020-05-18 MED ORDER — FIDAXOMICIN 200 MG PO TABS: 200.0000 mg | ORAL_TABLET | Freq: Two times a day (BID) | ORAL | 1 refills | Status: DC

## 2020-05-18 NOTE — Progress Notes (Signed)
Rfv: new visit for cdiff  Patient ID: Gina Wise, female   DOB: 1961-08-27, 59 y.o.   MRN: [id number]  HPI Hx of lupus and sjorgrens, with occasional colitis flare Had covid at end of January. Had sequelae of diarrhea which continued.Suffer from colitis flares - but continued to get worse in may.  7 times this morning - loose mush, with blood and mucus In may abd CT and colonoscopy - colon walls are thickened and some inflammation Improved pain on left side   In June 2021 had first positive cdiff test - had 10 day -oral vancomycin had improved her symptoms and went to work for a week But in July 6th had worsening symptoms again, repeat positive test, and now treated with 228m QID which made slight improvement but stool never formed. Mushy with blood and mucous.  She has notice pruritic rash  Social History   Tobacco Use  . Smoking status: Never Smoker  . Smokeless tobacco: Never Used  Vaping Use  . Vaping Use: Never used  Substance Use Topics  . Alcohol use: Not Currently    Alcohol/week: 0.0 standard drinks    Comment: socially  . Drug use: No  family history includes Alcohol abuse in her father and maternal grandmother; Allergy (severe) in her son; Aneurysm in her paternal grandfather; Arthritis in her sister; Breast cancer in her paternal aunt; Colon cancer in her maternal grandfather; Depression in her mother and sister; Early death in her father, maternal uncle, and paternal uncle; Emphysema in her father and paternal aunt; Endometriosis in her mother and sister; Hearing loss in her father and mother; Heart attack in her maternal uncle; Heart disease in her mother and paternal grandmother; Hepatitis in her mother; Hypertension in her father and mother; Lung cancer in her paternal uncle, paternal uncle, and paternal uncle; Lung disease in her paternal aunt; Lupus in her sister; Pancreatic disease in her mother; Parkinson's disease in her maternal grandmother; Uterine cancer in her  maternal aunt; Vision loss in her father.  Outpatient Encounter Medications as of 05/18/2020  Medication Sig  . cetirizine (ZYRTEC) 10 MG tablet Take 10 mg by mouth daily.  .Marland Kitchenesomeprazole (NEXIUM) 40 MG capsule Take 1 capsule (40 mg total) by mouth 2 (two) times daily.  . hydrochlorothiazide (HYDRODIURIL) 25 MG tablet TAKE 1 TABLET(25 MG) BY MOUTH DAILY  . levothyroxine (SYNTHROID) 125 MCG tablet TAKE 1 TABLET(125 MCG) BY MOUTH DAILY  . ondansetron (ZOFRAN) 8 MG tablet Take 1 tablet (8 mg total) by mouth every 8 (eight) hours as needed for nausea, vomiting or refractory nausea / vomiting.  . sertraline (ZOLOFT) 100 MG tablet Take 1/2 (one-half) tablet by mouth once daily  . SUMAtriptan (IMITREX) 100 MG tablet May repeat in 2 hours if headache persists(Plz sched with new provider)  . vancomycin (VANCOCIN) 250 MG capsule Take 1 capsule (250 mg total) by mouth 4 (four) times daily.  .Marland Kitchendicyclomine (BENTYL) 20 MG tablet TAKE 1 TABLET BY MOUTH THREE TIMES DAILY AS NEEDED FOR SPASMS (Patient not taking: Reported on 05/18/2020)  . diphenoxylate-atropine (LOMOTIL) 2.5-0.025 MG tablet Take 1 tablet by mouth 4 (four) times daily as needed for diarrhea or loose stools (1-2 tablets). (Patient not taking: Reported on 05/18/2020)  . rifaximin (XIFAXAN) 550 MG TABS tablet Take 1 tablet (550 mg total) by mouth 3 (three) times daily. (Patient not taking: Reported on 05/18/2020)   No facility-administered encounter medications on file as of 05/18/2020.     Patient Active Problem List  Diagnosis Date Noted  . Left ear impacted cerumen 07/13/2019  . Hearing loss due to cerumen impaction, left 07/13/2019  . Upper respiratory infection 07/13/2019  . Impacted cerumen of left ear 07/13/2019  . Left ear pain 07/02/2019  . COVID-19 03/12/2019  . Acute maxillary sinusitis 12/26/2018  . 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 Colitis 05/29/2018  . Headache 05/29/2018  . Dehydration 05/29/2018  . BMI 31.0-31.9,adult 01/09/2018  .  Pelvic floor weakness in female 12/11/2017  . Disequilibrium 09/03/2017  . Hypokalemia 08/26/2017  . Right sided numbness 08/25/2017  . Right-sided muscle weakness 08/25/2017  . Vertigo 08/25/2017  . 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 Allergic rhinitis 07/13/2016  . Lupus (systemic lupus erythematosus) ([place]) 02/17/2016  . Interstitial lung disease due to connective tissue disease ([place]) 11/07/2015  . Sjogren's disease ([place])   . Lung disease with systemic lupus erythematosus ([place]) 07/14/2015  . Throat clearing 08/10/2014  . Hyperlipidemia 08/10/2014  . Schatzki's ring 03/09/2014  . Unspecified constipation 02/23/2014  . GERD (gastroesophageal reflux disease) 02/23/2014  . Goiter 07/03/2013  . Chronic cough 01/02/2013  . Iron deficiency 03/13/2012  . Dysfunctional uterine bleeding 01/02/2012  . Insomnia 10/03/2011  . Dyslipidemia 09/03/2011  . Overweight 01/03/2011  . 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 Migraine 10/04/2009  . Hypothyroidism 09/09/2009  . Essential hypertension, benign 09/09/2009     Health Maintenance Due  Topic Date Due  . Hepatitis C Screening  Never done  . MAMMOGRAM  01/23/2020  . TETANUS/TDAP  03/13/2020     Review of Systems 12 point ros is negative except what is mentioned above Physical Exam   Wt 196 lb (88.9 kg)   LMP 04/03/2012 Comment: Still has Cervix and Ovaries  BMI 32.62 kg/m   Gen= a xo by 4 in nad Heent= MMM op clear Gi = bs + non tender  Lab Results  Component Value Date   HEPBSAB POS (A) 02/23/2010   Lab Results  Component Value Date   LABRPR NON REAC 07/13/2016    CBC Lab Results  Component Value Date   WBC 6.6 02/05/2020   RBC 4.70 02/05/2020   HGB 13.7 02/05/2020   HCT 39.3 02/05/2020   PLT 335 02/05/2020   MCV 84 02/05/2020   MCH 29.1 02/05/2020   MCHC 34.9 02/05/2020   RDW 13.6 02/05/2020   LYMPHSABS 1.7 02/05/2020   MONOABS 1.2 (H) 12/26/2016   EOSABS 0.3 02/05/2020     BMET Lab Results  Component Value Date   NA 139 02/26/2020   K 3.7 02/26/2020   CL 105 02/26/2020   CO2 19 (L) 02/26/2020   GLUCOSE 113 (H) 02/26/2020   BUN 12 02/26/2020   CREATININE 0.81 02/26/2020   CALCIUM 9.2 02/26/2020   GFRNONAA 81 02/26/2020   GFRAA 93 02/26/2020      Assessment and Plan  Recurrent cdifficile = Will do trial of fidaxomicin plus consider zinplava ( one and dose)  Stool kit for cdifficiile if it subsequently occures  Spent 35 min describing cdiff treatment plan with patient

## 2020-05-18 NOTE — Telephone Encounter (Signed)
[facility] Patient Advocate Encounter   Was successful in obtaining a [employer] copay card.  This copay card will make the patients copay $50.  I have spoken with the patient and handed her the card to provide her pharmacy.    BIN [id number] PCN LOYALTY GRP [id number] ID [id number]  Gina Wise Patient [facility] for [facility] Phone: [phone] Fax:  [phone]

## 2020-05-18 NOTE — Telephone Encounter (Addendum)
[facility] Patient Advocate Encounter   Received notification from [place] that prior authorization for Zinplava is required.   PA submitted on 05/18/2020 Key [id number] Status is APPROVED.  Her copay will be $100 and the [employer] copay assistance fund requires patient's to pay the first $100, therefore no assistance is available.    [facility] will continue to follow.   Venida Jarvis. Nadara Mustard [place] [place] Patient [facility] for [facility] Phone: [phone] Fax:  [phone]

## 2020-05-20 ENCOUNTER — Other Ambulatory Visit (HOSPITAL_COMMUNITY): Payer: Self-pay

## 2020-05-20 ENCOUNTER — Other Ambulatory Visit: Payer: 59

## 2020-05-20 ENCOUNTER — Other Ambulatory Visit: Payer: Self-pay

## 2020-05-20 ENCOUNTER — Encounter: Payer: Self-pay | Admitting: Family Medicine

## 2020-05-20 DIAGNOSIS — R195 Other fecal abnormalities: Secondary | ICD-10-CM

## 2020-05-20 DIAGNOSIS — A0472 Enterocolitis due to Clostridium difficile, not specified as recurrent: Secondary | ICD-10-CM

## 2020-05-23 ENCOUNTER — Other Ambulatory Visit: Payer: Self-pay

## 2020-05-23 ENCOUNTER — Ambulatory Visit (HOSPITAL_COMMUNITY)
Admission: RE | Admit: 2020-05-23 | Discharge: 2020-05-23 | Disposition: A | Discharge status: Home / Self Care | Payer: 59 | Source: Ambulatory Visit | Attending: Internal Medicine | Admitting: Internal Medicine

## 2020-05-23 ENCOUNTER — Telehealth: Payer: Self-pay

## 2020-05-23 DIAGNOSIS — A498 Other bacterial infections of unspecified site: Secondary | ICD-10-CM | POA: Diagnosis present

## 2020-05-23 LAB — CLOSTRIDIUM DIFFICILE TOXIN B, QUALITATIVE, REAL-TIME PCR: Toxigenic C. Difficile by PCR: NOT DETECTED

## 2020-05-23 MED ORDER — SODIUM CHLORIDE 0.9 % IV SOLN
10.0000 mg/kg | Freq: Once | INTRAVENOUS | Status: AC
  Administered 2020-05-23: 889 mg via INTRAVENOUS
  Filled 2020-05-23: qty 35.56

## 2020-05-23 NOTE — Telephone Encounter (Signed)
Office notes faxed to [place] [phone]). ID # [id number]  Carlean Purl, RN

## 2020-05-23 NOTE — Discharge Instructions (Signed)
Bezlotoxumab injection What is this medicine? BEZLOTOXUMAB (bez loe TOX ue mab) is used for certain kinds of bacterial infections in the bowel. It will not work for colds, flu, or other viral infections. This medicine may be used for other purposes; ask your health care provider or pharmacist if you have questions. COMMON BRAND NAME(S): ZINPLAVA What should I tell my health care provider before I take this medicine? They need to know if you have any of these conditions:  heart disease  an unusual or allergic reaction to bezlotoxumab, other medicines, foods, dyes, or preservatives  pregnant or trying to get pregnant  breast-feeding How should I use this medicine? This medicine is for infusion into a vein. It is given by a health care professional in a hospital or clinic setting. Talk to your pediatrician regarding the use of this medicine in children. Special care may be needed. Overdosage: If you think you have taken too much of this medicine contact a poison control center or emergency room at once. NOTE: This medicine is only for you. Do not share this medicine with others. What if I miss a dose? This does not apply; this medicine is not for regular use. What may interact with this medicine? Interactions are not expected. This list may not describe all possible interactions. Give your health care provider a list of all the medicines, herbs, non-prescription drugs, or dietary supplements you use. Also tell them if you smoke, drink alcohol, or use illegal drugs. Some items may interact with your medicine. What should I watch for while using this medicine? Tell your doctor or healthcare professional if your symptoms do not start to get better or if they get worse. Do not treat diarrhea with over the counter products. What side effects may I notice from receiving this medicine? Side effects that you should report to your doctor or health care professional as soon as possible:  allergic  reactions like skin rash, itching or hives, swelling of the face, lips, or tongue  breathing problems Side effects that usually do not require medical attention (report to your doctor or health care professional if they continue or are bothersome):  dizziness  fever  headache  nausea  tiredness This list may not describe all possible side effects. Call your doctor for medical advice about side effects. You may report side effects to FDA at 1-800-FDA-1088. Where should I keep my medicine? This drug is given in a hospital or clinic and will not be stored at home. NOTE: This sheet is a summary. It may not cover all possible information. If you have questions about this medicine, talk to your doctor, pharmacist, or health care provider.  2020 Elsevier/Gold Standard (2015-11-09 17:22:27)

## 2020-05-24 NOTE — Telephone Encounter (Signed)
Please see message . Thank you .

## 2020-06-09 ENCOUNTER — Telehealth: Payer: Self-pay

## 2020-06-09 DIAGNOSIS — R195 Other fecal abnormalities: Secondary | ICD-10-CM

## 2020-06-09 DIAGNOSIS — A0471 Enterocolitis due to Clostridium difficile, recurrent: Secondary | ICD-10-CM

## 2020-06-09 NOTE — Telephone Encounter (Signed)
Patient states post Zinplava/Deficid. This past weekend started with lots of gas and now she is having pain in her stomach, watery diarrhea over 20 times, low grade fever 99.4. Patient uses [facility] on [address]  Routing to provider to make aware.  Eugenia Mcalpine

## 2020-06-10 ENCOUNTER — Other Ambulatory Visit: Payer: Self-pay | Admitting: Family Medicine

## 2020-06-10 MED ORDER — VANCOMYCIN HCL 250 MG PO CAPS: 250.0000 mg | ORAL_CAPSULE | Freq: Four times a day (QID) | ORAL | 0 refills | Status: AC

## 2020-06-10 NOTE — Addendum Note (Signed)
Addended by: Eugenia Mcalpine on: 06/10/2020 11:30 AM   Modules accepted: Orders

## 2020-06-10 NOTE — Telephone Encounter (Signed)
Patient called office today to follow up on concerns from yesterday. States temp was 99, still high from patient's normal. Is complaining of increase abdominal pain. Describes it as stabbing pain. Would like to know what she should do. Will forward message to MD. Aundria Rud, [place]

## 2020-06-10 NOTE — Telephone Encounter (Signed)
LPN spoke with Dr. Baxter Flattery regarding patient. Per Dr. Baxter Flattery test patient for Cdiff and send oral vancomycin 176m take four times a day for 10 days. Patient will provide sample prior to starting medication. Patient states she will not be able to pick stool kit until Monday. Advise patient to stay hydrated with plenty of fluids Stool kit provided up front, lab order placed.  SEugenia Mcalpine

## 2020-06-14 ENCOUNTER — Other Ambulatory Visit: Payer: Self-pay

## 2020-06-14 ENCOUNTER — Other Ambulatory Visit: Payer: 59

## 2020-06-14 DIAGNOSIS — R195 Other fecal abnormalities: Secondary | ICD-10-CM

## 2020-06-14 NOTE — Telephone Encounter (Signed)
Patient called office today stating stool kit had another patient's name on it. Requested patient take off label and write her name and DOB on it. Lab was notified of label error. Will print new copy once patient drops off stool kit. Patient will bring sample by tomorrow. [place]

## 2020-06-14 NOTE — Telephone Encounter (Signed)
Medication refill request.   Last visit 03/25/2020 telemedicine  Last refill:  12/14/2019 // #30 // refills -2

## 2020-06-15 ENCOUNTER — Telehealth: Payer: Self-pay | Admitting: Internal Medicine

## 2020-06-15 LAB — CLOSTRIDIUM DIFFICILE TOXIN B, QUALITATIVE, REAL-TIME PCR: Toxigenic C. Difficile by PCR: DETECTED — AB

## 2020-06-16 NOTE — Telephone Encounter (Signed)
Spoke with patient. Advised patient to take medication with food and plenty of water. Patient just started vanc this week and will continue taking medication as prescribed. Gina Wise

## 2020-06-17 ENCOUNTER — Encounter: Payer: Self-pay | Admitting: Family Medicine

## 2020-06-23 ENCOUNTER — Telehealth: Payer: Self-pay

## 2020-06-23 ENCOUNTER — Encounter: Payer: Self-pay | Admitting: Family Medicine

## 2020-06-23 NOTE — Telephone Encounter (Signed)
Forms were faxed and sent to scan, pt was sent a message letting her know that FMLA forms was faxed to [employer].

## 2020-06-23 NOTE — Telephone Encounter (Signed)
Form signed and complete and returned to your desk

## 2020-06-23 NOTE — Telephone Encounter (Signed)
Dr. Loletha Grayer please advise.  Received pt's FMLA papers I will give these to you for your review and signature.

## 2020-07-04 ENCOUNTER — Telehealth: Payer: Self-pay | Admitting: *Deleted

## 2020-07-04 NOTE — Telephone Encounter (Signed)
Patient called back, despondent.  She is frustrated, tired of having diarrhea, states that it is affecting her body and her mental health. She is having episodes of diarrhea at least 2x an hour since finishing the oral vancomycin 9/9 (was a 10 day course, vancomycin 250 mg QID).  She has had multiple courses of vancomycin, dificid, and 1 infusion of zinplava.  She had relief of her diarrhea symptoms for 2 weeks after zinplava infusion, but the diarrhea came back.  She is taking her regular nexium 40 mg once daily, hydrochlorothiazide, synthroid, and zoloft.   She is asking for help, does take bentyl and zofran as needed. She would like to know if Dr Baxter Flattery would refill the lomotil (originally prescribed by Dr [name]). Please advise on the next step. Landis Gandy, RN

## 2020-07-08 MED ORDER — VANCOMYCIN HCL 125 MG PO CAPS
ORAL_CAPSULE | ORAL | 0 refills | Status: AC
Start: 2020-07-08 — End: 2020-08-26

## 2020-07-08 NOTE — Telephone Encounter (Signed)
Patient states she usually forgets to take the Nexium so she has not been taking it. These past few days states she has felt very sick with recurrence of diarrhea episodes. Her abdomen is very swollen.  Patient made aware that Janene Madeira, NP will review her chart and plans to call patient between 1-2pm this afternoon. Patient verbalized understanding. Eugenia Mcalpine

## 2020-07-08 NOTE — Telephone Encounter (Signed)
I am not sure she should go back on the imodium given her recurrences for infection - if this is a recurrence then we should not use it. I need to read through her previous episodes more in-depth and then see what we need to do.   One thing I would certainly advise her to do is stop the Nexium - these acid reducers can certainly make her recurrence for CDiff higher.   Can we plan a time I can talk with her on the phone and get her set up with a c diff collection kit?  

## 2020-07-08 NOTE — Telephone Encounter (Signed)
Called Ms. Serena back. She is so very miserable. Last night was the worst episodes - the pain in the abdomen and swelling.  She took imodium yesterday and has not been eating much - she has not had.  She feels as if she has never had a complete eradication.  She has had some off and on with constipation and watery diarrhea over the last 8 months. She always has some improvement but then comes right back when meds are stopped.   I asked her to please not taken any further imodium and stop her nexium. Will start her on a prolonged vancomycin taper and see what can be done with regards to a possible FMT. Discussed that there have been some restrictions with [employer] and COVID.    She has follow up with Dr. Baxter Flattery arranged in a little less than 1 month to follow up.  Will send a mychart message and ask for an update in 1-2 weeks.   If she has worsening symptoms she will need to come to the hospital for supportive care.    Janene Madeira, MSN, NP-C [facility] for Infectious Disease [email or web address] Pager: [phone] Office: [phone] [place]: [phone]

## 2020-07-08 NOTE — Addendum Note (Signed)
Addended by: Somerset Callas on: 07/08/2020 03:57 PM   Modules accepted: Orders

## 2020-07-14 ENCOUNTER — Ambulatory Visit: Payer: 59 | Admitting: Family Medicine

## 2020-07-14 ENCOUNTER — Encounter: Payer: Self-pay | Admitting: Family Medicine

## 2020-07-14 ENCOUNTER — Other Ambulatory Visit: Payer: Self-pay

## 2020-07-14 VITALS — BP 144/86 | HR 103 | Temp 98.6°F | Ht 65.0 in | Wt 195.4 lb

## 2020-07-14 DIAGNOSIS — S0501XA Injury of conjunctiva and corneal abrasion without foreign body, right eye, initial encounter: Secondary | ICD-10-CM

## 2020-07-14 MED ORDER — GENTAMICIN SULFATE 0.3 % OP SOLN: 1.0000 [drp] | OPHTHALMIC | 0 refills | Status: DC

## 2020-07-14 MED ORDER — KETOROLAC TROMETHAMINE 0.4 % OP SOLN: 1.0000 [drp] | Freq: Four times a day (QID) | OPHTHALMIC | 0 refills | Status: DC

## 2020-07-14 NOTE — Progress Notes (Signed)
Gina Wise is a 59 y.o. female  Chief Complaint  Patient presents with  . Acute Visit    RT eye pain x 2 days (possible scratch)    HPI: Gina Wise is a 59 y.o. female who complains of significant Rt eye pain, redness, light sensitivity and "stabbing pain" when light hits her eye, watering since yesterday AM. + headache. Pt states she was taking a shower and felt like there was an eyelash in her eye. She flushed her eye with water and used some lubricating drops w/o relief. No discharge. No vision changes.  She has been sitting at home in dark with sun glasses on.  Last eye exam 2 years ago.   Past Medical History:  Diagnosis Date  . 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 Allergy   . Anemia   . Bruising 05/10/2015  . CAP (community acquired pneumonia) 08/03/2015  . Chronic bronchitis ([place])    "get it q yr; in the spring time" (02/17/2014)  . COVID-19   . Depression    "have taken zoloft since 1996" (02/17/2014)  . Diverticulosis   . Dyspnea    from scar tissue  . Epistaxis 12/01/2015  . Gastritis   . GERD (gastroesophageal reflux disease)   . Hematemesis with nausea 08/01/2016  . Hiatal hernia   . High cholesterol   . Hypertension   . Hypothyroid   . Internal hemorrhoids   . Migraines    "maybe a couple/yr" (02/17/2014)  . Multiple environmental allergies   . Overweight(278.02)   . Peri-menopause   . Schatzki's ring   . Sjogren's disease ([place])   . Systemic lupus ([place])     Past Surgical History:  Procedure Laterality Date  . APPENDECTOMY  1987  . ESOPHAGOGASTRODUODENOSCOPY N/A 02/28/2014   Procedure: ESOPHAGOGASTRODUODENOSCOPY (EGD);  Surgeon: Jerene Bears, MD;  Location: [facility] ENDOSCOPY;  Service: Endoscopy;  Laterality: N/A;  . ESOPHAGOGASTRODUODENOSCOPY N/A 08/02/2016   Procedure: ESOPHAGOGASTRODUODENOSCOPY (EGD);  Surgeon: Gatha Mayer, MD;  Location: [facility] ENDOSCOPY;  Service: Endoscopy;  Laterality: N/A;  . EXPLORATORY LAPAROTOMY  1987  . FOOT SURGERY Right ~1980    "don't know what they did; had to take something out"  . INGUINAL HERNIA REPAIR Right ~ 2003  . RADIOACTIVE SEED GUIDED EXCISIONAL BREAST BIOPSY Left 02/25/2018   Procedure: RADIOACTIVE SEED GUIDED EXCISIONAL BREAST BIOPSY;  Surgeon: Rolm Bookbinder, MD;  Location: [facility];  Service: General;  Laterality: Left;  . SHOULDER OPEN ROTATOR CUFF REPAIR Left 1995  . SHOULDER SURGERY Left 1997   "cartilage"  . TONSILLECTOMY AND ADENOIDECTOMY  1976  . TUBAL LIGATION  1998   laparoscopic  . VAGINAL HYSTERECTOMY  04/08/2012   menorrhagia  . VAGINAL PROLAPSE REPAIR  08/04/2018   Anterior and posterior with colp-mesh-Dr. Mikle Bosworth @ [facility] [place]    Social History   Socioeconomic History  . Marital status: Single    Spouse name: Not on file  . Number of children: Not on file  . Years of education: Not on file  . Highest education level: Not on file  Occupational History  . Not on file  Tobacco Use  . Smoking status: Never Smoker  . Smokeless tobacco: Never Used  Vaping Use  . Vaping Use: Never used  Substance and Sexual Activity  . Alcohol use: Not Currently    Alcohol/week: 0.0 standard drinks    Comment: socially  . Drug use: No  . Sexual activity: Not on file  Other Topics Concern  . Not  on file  Social History Narrative   Works at [employer] Strain:   . Difficulty of Paying Living Expenses: Not on file  Food Insecurity:   . Worried About Charity fundraiser in the Last Year: Not on file  . Ran Out of Food in the Last Year: Not on file  Transportation Needs:   . Lack of Transportation (Medical): Not on file  . Lack of Transportation (Non-Medical): Not on file  Physical Activity:   . Days of Exercise per Week: Not on file  . Minutes of Exercise per Session: Not on file  Stress:   . Feeling of Stress : Not on file  Social Connections:   . Frequency of Communication with Friends and Family: Not on file   . Frequency of Social Gatherings with Friends and Family: Not on file  . Attends Religious Services: Not on file  . Active Member of Clubs or Organizations: Not on file  . Attends Archivist Meetings: Not on file  . Marital Status: Not on file  Intimate Partner Violence:   . Fear of Current or Ex-Partner: Not on file  . Emotionally Abused: Not on file  . Physically Abused: Not on file  . Sexually Abused: Not on file    Family History  Problem Relation Age of Onset  . Hypertension Father   . Vision loss Father   . Alcohol abuse Father   . Emphysema Father        Smoker  . Hearing loss Father   . Early death Father        Murdered  . Hypertension Mother   . Heart disease Mother   . Hepatitis Mother        Hep C  . Endometriosis Mother   . Pancreatic disease Mother   . Depression Mother   . Hearing loss Mother   . Lupus Sister   . Endometriosis Sister   . Arthritis Sister   . Depression Sister   . Breast cancer Paternal Aunt        69's  . Lung cancer Paternal Uncle        smoker  . Colon cancer Maternal Grandfather   . Uterine cancer Maternal Aunt        50's  . Emphysema Paternal Aunt        Non-smoker/Died of Pneumonia  . Lung cancer Paternal Uncle   . Heart attack Maternal Uncle   . Parkinson's disease Maternal Grandmother   . Alcohol abuse Maternal Grandmother   . Heart disease Paternal Grandmother        Died during angioplasty surgery  . Aneurysm Paternal Grandfather   . Allergy (severe) Son        ASA-Idiopathic Thrombocytopenia  . Early death Maternal Uncle        Murdered  . Lung disease Paternal Aunt        Bronchiecstasis  . Lung cancer Paternal Uncle   . Early death Paternal Uncle        Carbon Monoxide Poisoning/Accidental  . Stomach cancer Neg Hx   . Pancreatic cancer Neg Hx      Immunization History  Administered Date(s) Administered  . Influenza, Quadrivalent, Recombinant, Inj, Pf 08/07/2017  . Influenza,inj,Quad PF,6+ Mos  08/07/2017  . Influenza-Unspecified 10/08/2019  . PFIZER SARS-COV-2 Vaccination 01/14/2020, 02/08/2020  . Pneumococcal Polysaccharide-23 10/06/2016  . Td 03/13/2010    Outpatient Encounter Medications as of 07/14/2020  Medication Sig Note  . cetirizine (ZYRTEC) 10 MG tablet Take 10 mg by mouth daily.   Marland Kitchen esomeprazole (NEXIUM) 40 MG capsule Take 1 capsule (40 mg total) by mouth 2 (two) times daily. 05/18/2020: "not while taking vancomycin"  . hydrochlorothiazide (HYDRODIURIL) 25 MG tablet TAKE 1 TABLET(25 MG) BY MOUTH DAILY   . levothyroxine (SYNTHROID) 125 MCG tablet TAKE 1 TABLET(125 MCG) BY MOUTH DAILY   . meloxicam (MOBIC) 7.5 MG tablet Take by mouth.   . ondansetron (ZOFRAN) 8 MG tablet TAKE 1 TABLET BY MOUTH EVERY 8 HOURS AS NEEDED FOR NAUSEA, VOMITING   . sertraline (ZOLOFT) 100 MG tablet Take 1/2 (one-half) tablet by mouth once daily   . SUMAtriptan (IMITREX) 100 MG tablet May repeat in 2 hours if headache persists(Plz sched with new provider)   . vancomycin (VANCOCIN) 125 MG capsule Take 1 capsule (125 mg total) by mouth 4 (four) times daily for 14 days, THEN 1 capsule (125 mg total) in the morning and at bedtime for 7 days, THEN 1 capsule (125 mg total) daily for 7 days, THEN 1 capsule (125 mg total) every other day for 7 days, THEN 1 capsule (125 mg total) every 3 (three) days for 14 days.   Marland Kitchen dicyclomine (BENTYL) 20 MG tablet TAKE 1 TABLET BY MOUTH THREE TIMES DAILY AS NEEDED FOR SPASMS (Patient not taking: Reported on 05/18/2020) 05/18/2020: "I try not to take it"  . diphenoxylate-atropine (LOMOTIL) 2.5-0.025 MG tablet Take 1 tablet by mouth 4 (four) times daily as needed for diarrhea or loose stools (1-2 tablets). (Patient not taking: Reported on 05/18/2020) 05/18/2020: "Not taking at all right now"   No facility-administered encounter medications on file as of 07/14/2020.     ROS: Pertinent positives and negatives noted in HPI. Remainder of ROS non-contributory    Allergies    Allergen Reactions  . Contrast Media [Iodinated Diagnostic Agents] Hives and Itching  . Other Hives and Swelling    CATS AND CAT DANDER  . Codeine Nausea Only  . Erythromycin Nausea And Vomiting    Severe abdominal pain  . Iohexol Hives    BP (!) 144/86   Pulse (!) 103   Temp 98.6 F (37 C) (Temporal)   Ht 5\' 5"  (1.651 m)   Wt 195 lb 6.4 oz (88.6 kg)   LMP 04/03/2012 Comment: Still has Cervix and Ovaries  SpO2 98%   BMI 32.52 kg/m   Physical Exam Constitutional:      General: She is not in acute distress (pt is wearing sunglasses, in obvious discomfort).    Appearance: She is not toxic-appearing.  Eyes:     General:        Right eye: No foreign body or discharge.     Extraocular Movements:     Right eye: Normal extraocular motion and no nystagmus.     Conjunctiva/sclera:     Right eye: Right conjunctiva is injected. No exudate.    Pupils: Pupils are equal.      Comments: Significant diffuse conjunctival erythema, eye is watering  Neurological:     Mental Status: She is alert.     A/P:  1. Abrasion of right cornea, initial encounter - cont lubricating drops Rx: - ketorolac (ACULAR) 0.4 % SOLN; Place 1 drop into the right eye 4 (four) times daily.  Dispense: 5 mL; Refill: 0 - gentamicin (GARAMYCIN) 0.3 % ophthalmic solution; Place 1 drop into the right eye every 4 (four) hours.  Dispense: 5 mL; Refill: 0 -  Ambulatory referral to Ophthalmology - [facility] - work note given Discussed plan and reviewed medications with patient, including risks, benefits, and potential side effects. Pt expressed understand. All questions answered.  I spent 30 min with the patient today obtaining HPI, performing exam, discussing treatment and referral  This visit occurred during the SARS-CoV-2 public health emergency.  Safety protocols were in place, including screening questions prior to the visit, additional usage of staff PPE, and extensive cleaning of exam room while  observing appropriate contact time as indicated for disinfecting solutions.

## 2020-07-14 NOTE — Patient Instructions (Addendum)
[facility]  [phone] [address] [place], [place], [place] [address]  [phone] [address], [place], [place] [address]

## 2020-07-18 ENCOUNTER — Ambulatory Visit: Payer: 59 | Admitting: Internal Medicine

## 2020-07-21 ENCOUNTER — Encounter: Payer: Self-pay | Admitting: Family Medicine

## 2020-07-25 ENCOUNTER — Telehealth: Payer: Self-pay | Admitting: Pharmacy Technician

## 2020-07-25 ENCOUNTER — Encounter: Payer: Self-pay | Admitting: Internal Medicine

## 2020-07-25 ENCOUNTER — Ambulatory Visit (INDEPENDENT_AMBULATORY_CARE_PROVIDER_SITE_OTHER): Payer: 59 | Admitting: Internal Medicine

## 2020-07-25 ENCOUNTER — Other Ambulatory Visit: Payer: Self-pay

## 2020-07-25 VITALS — BP 134/87 | HR 97 | Temp 98.0°F | Ht 64.0 in | Wt 195.0 lb

## 2020-07-25 DIAGNOSIS — R195 Other fecal abnormalities: Secondary | ICD-10-CM

## 2020-07-25 DIAGNOSIS — A0471 Enterocolitis due to Clostridium difficile, recurrent: Secondary | ICD-10-CM | POA: Diagnosis not present

## 2020-07-25 NOTE — Progress Notes (Signed)
RFV: follow up for cdiff  Patient ID: Gina Wise, female   DOB: [birth year], 59 y.o.   MRN: [id number]  HPI  Now on twice a day vancomycin- since 3 days ago. Still feels that the frequency about the same. Less abdominal.  Has corneal abrasian -sees dr Katy Fitch. ?infiltrates?- related to lupus/sjorgens - from dry eyelids  "I feel like a prisoner" since so frequent diarrhea.   Had zinplava improvement in early July which helped for roughly 2 weeks.  Had hx of covid earlier in the year   Outpatient Encounter Medications as of 07/25/2020  Medication Sig   cetirizine (ZYRTEC) 10 MG tablet Take 10 mg by mouth daily.   dicyclomine (BENTYL) 20 MG tablet TAKE 1 TABLET BY MOUTH THREE TIMES DAILY AS NEEDED FOR SPASMS (Patient not taking: Reported on 05/18/2020)   diphenoxylate-atropine (LOMOTIL) 2.5-0.025 MG tablet Take 1 tablet by mouth 4 (four) times daily as needed for diarrhea or loose stools (1-2 tablets). (Patient not taking: Reported on 05/18/2020)   esomeprazole (NEXIUM) 40 MG capsule Take 1 capsule (40 mg total) by mouth 2 (two) times daily.   gentamicin (GARAMYCIN) 0.3 % ophthalmic solution Place 1 drop into the right eye every 4 (four) hours.   hydrochlorothiazide (HYDRODIURIL) 25 MG tablet TAKE 1 TABLET(25 MG) BY MOUTH DAILY   ketorolac (ACULAR) 0.4 % SOLN Place 1 drop into the right eye 4 (four) times daily.   levothyroxine (SYNTHROID) 125 MCG tablet TAKE 1 TABLET(125 MCG) BY MOUTH DAILY   meloxicam (MOBIC) 7.5 MG tablet Take by mouth.   ondansetron (ZOFRAN) 8 MG tablet TAKE 1 TABLET BY MOUTH EVERY 8 HOURS AS NEEDED FOR NAUSEA, VOMITING   sertraline (ZOLOFT) 100 MG tablet Take 1/2 (one-half) tablet by mouth once daily   SUMAtriptan (IMITREX) 100 MG tablet May repeat in 2 hours if headache persists(Plz sched with new provider)   vancomycin (VANCOCIN) 125 MG capsule Take 1 capsule (125 mg total) by mouth 4 (four) times daily for 14 days, THEN 1 capsule (125 mg total) in the  morning and at bedtime for 7 days, THEN 1 capsule (125 mg total) daily for 7 days, THEN 1 capsule (125 mg total) every other day for 7 days, THEN 1 capsule (125 mg total) every 3 (three) days for 14 days.   No facility-administered encounter medications on file as of 07/25/2020.     Patient Active Problem List   Diagnosis Date Noted   Left ear impacted cerumen 07/13/2019   Hearing loss due to cerumen impaction, left 07/13/2019   Upper respiratory infection 07/13/2019   Impacted cerumen of left ear 07/13/2019   Left ear pain 07/02/2019   COVID-19 03/12/2019   Acute maxillary sinusitis 12/26/2018   Enterocele 07/23/2018   Colitis 05/29/2018   Headache 05/29/2018   Dehydration 05/29/2018   BMI 31.0-31.9,adult 01/09/2018   Pelvic floor weakness in female 12/11/2017   Disequilibrium 09/03/2017   Hypokalemia 08/26/2017   Right sided numbness 08/25/2017   Right-sided muscle weakness 08/25/2017   Vertigo 08/25/2017   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   Allergic rhinitis 07/13/2016   Lupus (systemic lupus erythematosus) ([place]) 02/17/2016   Interstitial lung disease due to connective tissue disease ([place]) 11/07/2015   Sjogren's disease ([place])    Lung disease with systemic lupus erythematosus ([place]) 07/14/2015   Throat clearing 08/10/2014   Hyperlipidemia 08/10/2014   Schatzki's ring 03/09/2014   Unspecified constipation 02/23/2014   GERD (gastroesophageal reflux disease) 02/23/2014   Goiter 07/03/2013   Chronic cough 01/02/2013  Iron deficiency 03/13/2012   Dysfunctional uterine bleeding 01/02/2012   Insomnia 10/03/2011   Dyslipidemia 09/03/2011   Overweight 01/03/2011   MAJOR DPRSV DISORDER RECURRENT EPISODE MODERATE 10/12/2009   Migraine 10/04/2009   Hypothyroidism 09/09/2009   Essential hypertension, benign 09/09/2009     Health Maintenance Due  Topic Date Due   Hepatitis C Screening  Never done   MAMMOGRAM  01/23/2020    TETANUS/TDAP  03/13/2020   INFLUENZA VACCINE  05/22/2020     Review of Systems 12 point ros is negative, except what is mentioned in hpi Physical Exam   BP 134/87    Pulse 97    Temp 98 F (36.7 C)    Ht 5\' 4"  (1.626 m)    Wt 195 lb (88.5 kg)    LMP 04/03/2012 Comment: Still has Cervix and Ovaries   BMI 33.47 kg/m   gen =a x o by 3 in nad Abd= soft, BS+, no guarding Lab Results  Component Value Date   HEPBSAB POS (A) 02/23/2010   Lab Results  Component Value Date   LABRPR NON REAC 07/13/2016    CBC Lab Results  Component Value Date   WBC 6.6 02/05/2020   RBC 4.70 02/05/2020   HGB 13.7 02/05/2020   HCT 39.3 02/05/2020   PLT 335 02/05/2020   MCV 84 02/05/2020   MCH 29.1 02/05/2020   MCHC 34.9 02/05/2020   RDW 13.6 02/05/2020   LYMPHSABS 1.7 02/05/2020   MONOABS 1.2 (H) 12/26/2016   EOSABS 0.3 02/05/2020    BMET Lab Results  Component Value Date   NA 139 02/26/2020   K 3.7 02/26/2020   CL 105 02/26/2020   CO2 19 (L) 02/26/2020   GLUCOSE 113 (H) 02/26/2020   BUN 12 02/26/2020   CREATININE 0.81 02/26/2020   CALCIUM 9.2 02/26/2020   GFRNONAA 81 02/26/2020   GFRAA 93 02/26/2020      Assessment and Plan  Still having several loose bm but no cramping, not explosive but this far in vanco taper I would expect improvement. Let see if can get approval for zinplava. Continue activia yogurt Continue on oral vanco bid x 10 days, then do daily vancomycin Can do 1/2 tab of immodium. QOD just to see if improved.   Booster for covid = due in late October and early November, had side effects on prior doses.

## 2020-07-25 NOTE — Telephone Encounter (Signed)
[facility] Patient Advocate Encounter  Patient's insurance plan will cover a second month of Zinplava.  The copay is still $100 which the patient paid before.  Gina Wise. [name] Patient [facility] Phone: [phone] Fax:  [phone]

## 2020-07-25 NOTE — Progress Notes (Signed)
Patient states her episodes of diarrhea  has eased. She has only had one episode today, but has not eaten. Abdominal pain has also eased. Continues vancomycin taper twice daily for 7 days.

## 2020-07-27 ENCOUNTER — Telehealth (INDEPENDENT_AMBULATORY_CARE_PROVIDER_SITE_OTHER): Payer: 59 | Admitting: Family Medicine

## 2020-07-27 ENCOUNTER — Encounter: Payer: Self-pay | Admitting: Family Medicine

## 2020-07-27 VITALS — Ht 64.0 in | Wt 195.0 lb

## 2020-07-27 DIAGNOSIS — S0501XA Injury of conjunctiva and corneal abrasion without foreign body, right eye, initial encounter: Secondary | ICD-10-CM

## 2020-07-27 DIAGNOSIS — K529 Noninfective gastroenteritis and colitis, unspecified: Secondary | ICD-10-CM | POA: Diagnosis not present

## 2020-07-27 DIAGNOSIS — Z6833 Body mass index (BMI) 33.0-33.9, adult: Secondary | ICD-10-CM | POA: Diagnosis not present

## 2020-07-27 NOTE — Progress Notes (Signed)
Virtual Visit via Video Note  I connected with Gina Wise on 07/27/20 at  9:30 AM EDT by a video enabled telemedicine application and verified that I am speaking with the correct person using two identifiers. Location patient: home Location provider: work  Persons participating in the virtual visit: patient, provider  I discussed the limitations of evaluation and management by telemedicine and the availability of in person appointments. The patient expressed understanding and agreed to proceed.  Chief Complaint  Patient presents with  . Establish Care    TOC, no concerns.       HPI: Gina Wise is a 59 y.o. female seen today for routine f/u 1. Corneal abrasion - seeing Dr. Patrice Paradise (cornea specialist), on prednisone gtts, symptoms slowly improving. Pt was out of work [employer], has appt with Dr. Patrice Paradise tomorrow.  2. C. Diff - still symptomatic and following with ID Dr. Baxter Flattery. Likely going to have 1 more infusion and then going to proceed with fecal transplant.  3. She does not need any med refills.  4. She needs form completed for work due to BMI being above 29.9. She has cut out/down on sugar and has lost 198lbs to 195lbs.   Past Medical History:  Diagnosis Date  . 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 Allergy   . Anemia   . Bruising 05/10/2015  . CAP (community acquired pneumonia) 08/03/2015  . Chronic bronchitis ([place])    "get it q yr; in the spring time" (02/17/2014)  . COVID-19   . Depression    "have taken zoloft since 1996" (02/17/2014)  . Diverticulosis   . Dyspnea    from scar tissue  . Epistaxis 12/01/2015  . Gastritis   . GERD (gastroesophageal reflux disease)   . Hematemesis with nausea 08/01/2016  . Hiatal hernia   . High cholesterol   . Hypertension   . Hypothyroid   . Internal hemorrhoids   . Migraines    "maybe a couple/yr" (02/17/2014)  . Multiple environmental allergies   . Overweight(278.02)   . Peri-menopause   . Schatzki's ring   . Sjogren's disease ([place])    . Systemic lupus ([place])     Past Surgical History:  Procedure Laterality Date  . APPENDECTOMY  1987  . ESOPHAGOGASTRODUODENOSCOPY N/A 02/28/2014   Procedure: ESOPHAGOGASTRODUODENOSCOPY (EGD);  Surgeon: Jerene Bears, MD;  Location[facility] ENDOSCOPY;  Service: Endoscopy;  Laterality: N/A;  . ESOPHAGOGASTRODUODENOSCOPY N/A 08/02/2016   Procedure: ESOPHAGOGASTRODUODENOSCOPY (EGD);  Surgeon: Gatha Mayer, MD;  Location: [facility] ENDOSCOPY;  Service: Endoscopy;  Laterality: N/A;  . EXPLORATORY LAPAROTOMY  1987  . FOOT SURGERY Right ~1980   "don't know what they did; had to take something out"  . INGUINAL HERNIA REPAIR Right ~ 2003  . RADIOACTIVE SEED GUIDED EXCISIONAL BREAST BIOPSY Left 02/25/2018   Procedure: RADIOACTIVE SEED GUIDED EXCISIONAL BREAST BIOPSY;  Surgeon: Rolm Bookbinder, MD;  Location: [place];  Service: General;  Laterality: Left;  . SHOULDER OPEN ROTATOR CUFF REPAIR Left 1995  . SHOULDER SURGERY Left 1997   "cartilage"  . TONSILLECTOMY AND ADENOIDECTOMY  1976  . TUBAL LIGATION  1998   laparoscopic  . VAGINAL HYSTERECTOMY  04/08/2012   menorrhagia  . VAGINAL PROLAPSE REPAIR  08/04/2018   Anterior and posterior with colp-mesh-Dr. Mikle Bosworth @ [facility]    Family History  Problem Relation Age of Onset  . Hypertension Father   . Vision loss Father   . Alcohol abuse Father   . Emphysema Father  Smoker  . Hearing loss Father   . Early death Father        Murdered  . Hypertension Mother   . Heart disease Mother   . Hepatitis Mother        Hep C  . Endometriosis Mother   . Pancreatic disease Mother   . Depression Mother   . Hearing loss Mother   . Lupus Sister   . Endometriosis Sister   . Arthritis Sister   . Depression Sister   . Breast cancer Paternal Aunt        62's  . Lung cancer Paternal Uncle        smoker  . Colon cancer Maternal Grandfather   . Uterine cancer Maternal Aunt        50's  . Emphysema Paternal Aunt         Non-smoker/Died of Pneumonia  . Lung cancer Paternal Uncle   . Heart attack Maternal Uncle   . Parkinson's disease Maternal Grandmother   . Alcohol abuse Maternal Grandmother   . Heart disease Paternal Grandmother        Died during angioplasty surgery  . Aneurysm Paternal Grandfather   . Allergy (severe) Son        ASA-Idiopathic Thrombocytopenia  . Early death Maternal Uncle        Murdered  . Lung disease Paternal Aunt        Bronchiecstasis  . Lung cancer Paternal Uncle   . Early death Paternal Uncle        Carbon Monoxide Poisoning/Accidental  . Stomach cancer Neg Hx   . Pancreatic cancer Neg Hx     Social History   Tobacco Use  . Smoking status: Never Smoker  . Smokeless tobacco: Never Used  Vaping Use  . Vaping Use: Never used  Substance Use Topics  . Alcohol use: Not Currently    Alcohol/week: 0.0 standard drinks    Comment: socially  . Drug use: No     Current Outpatient Medications:  .  bacitracin ophthalmic ointment, Place 1 application into the right eye at bedtime. 1 Sparingly Right Eye Every Night, Disp: , Rfl:  .  cetirizine (ZYRTEC) 10 MG tablet, Take 10 mg by mouth daily., Disp: , Rfl:  .  esomeprazole (NEXIUM) 40 MG capsule, Take 1 capsule (40 mg total) by mouth 2 (two) times daily., Disp: 180 capsule, Rfl: 1 .  gentamicin (GARAMYCIN) 0.3 % ophthalmic solution, Place 1 drop into the right eye every 4 (four) hours., Disp: 5 mL, Rfl: 0 .  hydrochlorothiazide (HYDRODIURIL) 25 MG tablet, TAKE 1 TABLET(25 MG) BY MOUTH DAILY, Disp: 90 tablet, Rfl: 1 .  ketorolac (ACULAR) 0.4 % SOLN, Place 1 drop into the right eye 4 (four) times daily., Disp: 5 mL, Rfl: 0 .  levothyroxine (SYNTHROID) 125 MCG tablet, TAKE 1 TABLET(125 MCG) BY MOUTH DAILY, Disp: 90 tablet, Rfl: 1 .  ondansetron (ZOFRAN) 8 MG tablet, TAKE 1 TABLET BY MOUTH EVERY 8 HOURS AS NEEDED FOR NAUSEA, VOMITING, Disp: 30 tablet, Rfl: 2 .  sertraline (ZOLOFT) 100 MG tablet, Take 1/2 (one-half) tablet by  mouth once daily, Disp: 45 tablet, Rfl: 3 .  SUMAtriptan (IMITREX) 100 MG tablet, May repeat in 2 hours if headache persists(Plz sched with new provider), Disp: 20 tablet, Rfl: 0 .  trimethoprim-polymyxin b (POLYTRIM) ophthalmic solution, Place 1 drop into the right eye 3 (three) times daily., Disp: , Rfl:  .  vancomycin (VANCOCIN) 125 MG capsule, Take 1 capsule (125 mg  total) by mouth 4 (four) times daily for 14 days, THEN 1 capsule (125 mg total) in the morning and at bedtime for 7 days, THEN 1 capsule (125 mg total) daily for 7 days, THEN 1 capsule (125 mg total) every other day for 7 days, THEN 1 capsule (125 mg total) every 3 (three) days for 14 days., Disp: 84 capsule, Rfl: 0 .  dicyclomine (BENTYL) 20 MG tablet, TAKE 1 TABLET BY MOUTH THREE TIMES DAILY AS NEEDED FOR SPASMS (Patient not taking: Reported on 07/25/2020), Disp: 180 tablet, Rfl: 0 .  diphenoxylate-atropine (LOMOTIL) 2.5-0.025 MG tablet, Take 1 tablet by mouth 4 (four) times daily as needed for diarrhea or loose stools (1-2 tablets). (Patient not taking: Reported on 07/25/2020), Disp: 60 tablet, Rfl: 1 .  meloxicam (MOBIC) 7.5 MG tablet, Take by mouth. (Patient not taking: Reported on 07/27/2020), Disp: , Rfl:   Allergies  Allergen Reactions  . Contrast Media [Iodinated Diagnostic Agents] Hives and Itching  . Other Hives and Swelling    CATS AND CAT DANDER  . Codeine Nausea Only  . Erythromycin Nausea And Vomiting    Severe abdominal pain  . Iohexol Hives      ROS: See pertinent positives and negatives per HPI.   EXAM:  VITALS per patient if applicable: Ht 5\' 4"  (1.626 m)   Wt 195 lb (88.5 kg) Comment: pt reported  LMP 04/03/2012 Comment: Still has Cervix and Ovaries  BMI 33.47 kg/m    GENERAL: alert, oriented, appears well and in no acute distress  HEENT: atraumatic, conjunctiva clear, no obvious abnormalities on inspection of external nose and ears  NECK: normal movements of the head and neck  LUNGS: on  inspection no signs of respiratory distress, breathing rate appears normal, no obvious gross SOB, gasping or wheezing, no conversational dyspnea  CV: no obvious cyanosis  PSYCH/NEURO: pleasant and cooperative, no obvious depression or anxiety, speech and thought processing grossly intact   ASSESSMENT AND PLAN:  1. Body mass index (BMI) 33.0-33.9, adult - pt will need form completed for work - ok to do that - pt is had lost 3lbs with less sugar intake and will cont to work to improve diet and lose weight. Exercise is difficult with all other chronic medical issues  2. Colitis - following with ID Dr. Baxter Flattery, planning for fecal oral transplant after one more zinplava infusion  3. Abrasion of right cornea, initial encounter - following with Dr. Patrice Paradise (corneal abrasion) and next appt is tomorrow - overall improving but slowly   I discussed the assessment and treatment plan with the patient. The patient was provided an opportunity to ask questions and all were answered. The patient agreed with the plan and demonstrated an understanding of the instructions.   The patient was advised to call back or seek an in-person evaluation if the symptoms worsen or if the condition fails to improve as anticipated.   Letta Median, DO

## 2020-07-28 ENCOUNTER — Telehealth: Payer: Self-pay

## 2020-07-28 DIAGNOSIS — A0472 Enterocolitis due to Clostridium difficile, not specified as recurrent: Secondary | ICD-10-CM

## 2020-07-28 MED ORDER — SODIUM CHLORIDE 0.9 % IV SOLN: 10.0000 mg/kg | Freq: Once | INTRAVENOUS | 0 refills | Status: AC

## 2020-07-28 NOTE — Telephone Encounter (Signed)
Patient is calling to see if her infusions have been scheduled.  I do not see an order for medications but there is a note regarding Zinplava infusion.    Please give order for infusion .   Laverle Patter, RN

## 2020-07-28 NOTE — Telephone Encounter (Signed)
Pharmacy states patient's insurance has approved a second dose of Zinplava infusion.   We will send orders to short stay for scheduling.   Laverle Patter, RN

## 2020-07-29 NOTE — Telephone Encounter (Signed)
Called Short Stay and scheduled infusion .  August 04, 2020 @ 8:00 am.   Per Dr Baxter Flattery patient will receive Zinplava 10mg /kg infusion for 1 dose only.   Patient informed. Lab orders faxed and sent for scanning.   Laverle Patter, RN

## 2020-08-03 ENCOUNTER — Other Ambulatory Visit (HOSPITAL_COMMUNITY): Payer: Self-pay | Admitting: *Deleted

## 2020-08-04 ENCOUNTER — Other Ambulatory Visit: Payer: Self-pay

## 2020-08-04 ENCOUNTER — Ambulatory Visit (HOSPITAL_COMMUNITY)
Admission: RE | Admit: 2020-08-04 | Discharge: 2020-08-04 | Disposition: A | Discharge status: Home / Self Care | Payer: 59 | Source: Ambulatory Visit | Attending: Internal Medicine | Admitting: Internal Medicine

## 2020-08-04 DIAGNOSIS — A0472 Enterocolitis due to Clostridium difficile, not specified as recurrent: Secondary | ICD-10-CM | POA: Insufficient documentation

## 2020-08-04 MED ORDER — SODIUM CHLORIDE 0.9 % IV SOLN
10.0000 mg/kg | Freq: Once | INTRAVENOUS | Status: AC
  Administered 2020-08-04: 885 mg via INTRAVENOUS
  Filled 2020-08-04: qty 35.4

## 2020-08-10 ENCOUNTER — Telehealth: Payer: Self-pay | Admitting: Family Medicine

## 2020-08-10 ENCOUNTER — Encounter: Payer: Self-pay | Admitting: Family Medicine

## 2020-08-10 NOTE — Telephone Encounter (Signed)
Patient is calling back to let the office know that the original form she sent through [place] can be shredded because she forgot to sign it. She is sending another copy over to put on the provider's desk.

## 2020-08-10 NOTE — Telephone Encounter (Signed)
Patient is calling because she has been trying to send her Wellness Screening appeal through [place], but has not had success getting it to go through. She needs this completed by tomorrow and has no way to drop it off at the office. Please call her at [phone] and advise.

## 2020-08-10 NOTE — Telephone Encounter (Signed)
Ok, I got the new form and printed it off.  Pt informed via Mychart.

## 2020-08-24 ENCOUNTER — Other Ambulatory Visit: Payer: Self-pay

## 2020-08-24 ENCOUNTER — Telehealth: Payer: Self-pay

## 2020-08-24 ENCOUNTER — Encounter: Payer: Self-pay | Admitting: Internal Medicine

## 2020-08-24 ENCOUNTER — Ambulatory Visit (INDEPENDENT_AMBULATORY_CARE_PROVIDER_SITE_OTHER): Payer: 59 | Admitting: Internal Medicine

## 2020-08-24 VITALS — BP 143/98 | HR 97 | Temp 97.9°F | Ht 66.0 in | Wt 196.0 lb

## 2020-08-24 DIAGNOSIS — R195 Other fecal abnormalities: Secondary | ICD-10-CM | POA: Diagnosis not present

## 2020-08-24 DIAGNOSIS — A0472 Enterocolitis due to Clostridium difficile, not specified as recurrent: Secondary | ICD-10-CM

## 2020-08-24 NOTE — Telephone Encounter (Signed)
Unable to reach patient, RN left message on identified voicemail letting her know that she has an upcoming appointment with Dr. Hilarie Fredrickson at [place] on 09/22/20 at 10:10 am. Instructed patient to call back with any questions.  Beryle Flock, RN

## 2020-08-24 NOTE — Progress Notes (Signed)
RFV: follow up for c.difficile  Patient ID: Gina Wise, female   DOB: 10-05-1961, 59 y.o.   MRN: [id number]  HPI [name] is a 59yo F with hx of lupus-sjogrens auto immune disorder. She also had COVID-19 in January with still lingering symptoms of fatigue, and episodic diarrhea. She was diagnosed with cdiff in June 2021, did not respond to initial treatment, and taper. Subsequently had zinplava which worked temporarily. She had recurrent + and did not appear to improve with oral vancomycin, did try 2nd dose of zinplava on oct 14th, along with vancomycin which she just completed last week. She states that she has not felt well since infusion. Still having intermittent diarrhea with cramping, no blood in stool. In the last 2 days improved- now has soft formed stools.  She feels that diarrhea is affecting her daily productivity for work. 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 She also states still feeling exceptionally fatigued. Has not been back to see dr pyrtle since Spring 2021.   Outpatient Encounter Medications as of 08/24/2020  Medication Sig  . cetirizine (ZYRTEC) 10 MG tablet Take 10 mg by mouth daily.  Marland Kitchen esomeprazole (NEXIUM) 40 MG capsule Take 1 capsule (40 mg total) by mouth 2 (two) times daily.  . hydrochlorothiazide (HYDRODIURIL) 25 MG tablet TAKE 1 TABLET(25 MG) BY MOUTH DAILY  . levothyroxine (SYNTHROID) 125 MCG tablet TAKE 1 TABLET(125 MCG) BY MOUTH DAILY  . ondansetron (ZOFRAN) 8 MG tablet TAKE 1 TABLET BY MOUTH EVERY 8 HOURS AS NEEDED FOR NAUSEA, VOMITING  . sertraline (ZOLOFT) 100 MG tablet Take 1/2 (one-half) tablet by mouth once daily  . SUMAtriptan (IMITREX) 100 MG tablet May repeat in 2 hours if headache persists(Plz sched with new provider)  . bacitracin ophthalmic ointment Place 1 application into the right eye at bedtime. 1 Sparingly Right Eye Every Night (Patient not taking: Reported on 08/24/2020)  . gentamicin (GARAMYCIN) 0.3 % ophthalmic  solution Place 1 drop into the right eye every 4 (four) hours. (Patient not taking: Reported on 08/24/2020)  . ketorolac (ACULAR) 0.4 % SOLN Place 1 drop into the right eye 4 (four) times daily. (Patient not taking: Reported on 08/24/2020)  . trimethoprim-polymyxin b (POLYTRIM) ophthalmic solution Place 1 drop into the right eye 3 (three) times daily. (Patient not taking: Reported on 08/24/2020)  . vancomycin (VANCOCIN) 125 MG capsule Take 1 capsule (125 mg total) by mouth 4 (four) times daily for 14 days, THEN 1 capsule (125 mg total) in the morning and at bedtime for 7 days, THEN 1 capsule (125 mg total) daily for 7 days, THEN 1 capsule (125 mg total) every other day for 7 days, THEN 1 capsule (125 mg total) every 3 (three) days for 14 days. (Patient not taking: Reported on 08/24/2020)   No facility-administered encounter medications on file as of 08/24/2020.     Patient Active Problem List   Diagnosis Date Noted  . Clostridium difficile diarrhea 07/28/2020  . Left ear impacted cerumen 07/13/2019  . Hearing loss due to cerumen impaction, left 07/13/2019  . Upper respiratory infection 07/13/2019  . Impacted cerumen of left ear 07/13/2019  . Left ear pain 07/02/2019  . COVID-19 03/12/2019  . Acute maxillary sinusitis 12/26/2018  . 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 Colitis 05/29/2018  . Headache 05/29/2018  . Dehydration 05/29/2018  . BMI 31.0-31.9,adult 01/09/2018  . Pelvic floor weakness in female 12/11/2017  . Disequilibrium 09/03/2017  . Hypokalemia 08/26/2017  . Right sided numbness 08/25/2017  .  Right-sided muscle weakness 08/25/2017  . Vertigo 08/25/2017  . 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 Allergic rhinitis 07/13/2016  . Lupus (systemic lupus erythematosus) ([place]) 02/17/2016  . Interstitial lung disease due to connective tissue disease ([place]) 11/07/2015  . Sjogren's disease ([place])   . Lung disease with systemic lupus erythematosus ([place]) 07/14/2015  . Throat clearing 08/10/2014  .  Hyperlipidemia 08/10/2014  . Schatzki's ring 03/09/2014  . Unspecified constipation 02/23/2014  . GERD (gastroesophageal reflux disease) 02/23/2014  . Goiter 07/03/2013  . Chronic cough 01/02/2013  . Iron deficiency 03/13/2012  . Dysfunctional uterine bleeding 01/02/2012  . Insomnia 10/03/2011  . Dyslipidemia 09/03/2011  . Overweight 01/03/2011  . 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 Migraine 10/04/2009  . Hypothyroidism 09/09/2009  . Essential hypertension, benign 09/09/2009     Health Maintenance Due  Topic Date Due  . Hepatitis C Screening  Never done  . MAMMOGRAM  01/23/2020  . TETANUS/TDAP  03/13/2020  . INFLUENZA VACCINE  05/22/2020     Review of Systems 12 point ros is negative, except what is mentioned above. Tearful for what she is going through.. Physical Exam   BP (!) 143/98   Pulse 97   Temp 97.9 F (36.6 C) (Oral)   Ht '5\' 6"'  (1.676 m)   Wt 196 lb (88.9 kg)   LMP 04/03/2012 Comment: Still has Cervix and Ovaries  SpO2 97%   BMI 31.64 kg/m   gen = a x o by 3 in NAD HEENT =PERRLA,  EOMI, MMM, OP clear abd = soft, bowel sounds are quiet, slow, present, NT Psych = tearful Lab Results  Component Value Date   HEPBSAB POS (A) 02/23/2010   Lab Results  Component Value Date   LABRPR NON REAC 07/13/2016    CBC Lab Results  Component Value Date   WBC 6.6 02/05/2020   RBC 4.70 02/05/2020   HGB 13.7 02/05/2020   HCT 39.3 02/05/2020   PLT 335 02/05/2020   MCV 84 02/05/2020   MCH 29.1 02/05/2020   MCHC 34.9 02/05/2020   RDW 13.6 02/05/2020   LYMPHSABS 1.7 02/05/2020   MONOABS 1.2 (H) 12/26/2016   EOSABS 0.3 02/05/2020    BMET Lab Results  Component Value Date   NA 139 02/26/2020   K 3.7 02/26/2020   CL 105 02/26/2020   CO2 19 (L) 02/26/2020   GLUCOSE 113 (H) 02/26/2020   BUN 12 02/26/2020   CREATININE 0.81 02/26/2020   CALCIUM 9.2 02/26/2020   GFRNONAA 81 02/26/2020   GFRAA 93 02/26/2020      Assessment and  Plan  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 Still having residual watery stools but has inconsistent pattern, not convinced this is still ongoing cdifficile. 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 She states that the times she has tried bentyl she also gets constipation side effect. For now, we will plan to Refer back to pyrtle, to see if any further work up needed or other medication for management of symptoms  Will give stool kit for testing in the event it worsens. Consider to refer to academic center that does FMT if cdifficile recurrence.

## 2020-09-09 ENCOUNTER — Other Ambulatory Visit: Payer: 59

## 2020-09-09 ENCOUNTER — Other Ambulatory Visit: Payer: Self-pay

## 2020-09-09 DIAGNOSIS — R195 Other fecal abnormalities: Secondary | ICD-10-CM

## 2020-09-09 NOTE — Addendum Note (Signed)
Addended by: Caffie Pinto on: 09/09/2020 11:18 AM   Modules accepted: Orders

## 2020-09-12 ENCOUNTER — Other Ambulatory Visit: Payer: Self-pay | Admitting: Internal Medicine

## 2020-09-12 LAB — CLOSTRIDIUM DIFFICILE TOXIN B, QUALITATIVE, REAL-TIME PCR: Toxigenic C. Difficile by PCR: DETECTED — AB

## 2020-09-13 ENCOUNTER — Telehealth: Payer: Self-pay

## 2020-09-13 ENCOUNTER — Telehealth: Payer: Self-pay | Admitting: *Deleted

## 2020-09-13 DIAGNOSIS — A0471 Enterocolitis due to Clostridium difficile, recurrent: Secondary | ICD-10-CM

## 2020-09-13 DIAGNOSIS — A0472 Enterocolitis due to Clostridium difficile, not specified as recurrent: Secondary | ICD-10-CM

## 2020-09-13 MED ORDER — VANCOMYCIN HCL 125 MG PO CAPS: ORAL_CAPSULE | ORAL | 0 refills | Status: AC

## 2020-09-13 NOTE — Telephone Encounter (Signed)
Per Dr Baxter Flattery, patient needs referral to either [facility] or [place] for FMT (Fecal Microbiota Transplant), as they are on hold at [facility] right now.  Patient has failed oral treatment (multiple courses of oral vancomycin) and IV treatment (treated twice with zinplava). Patient currently restarted on oral vancomycin taper. Landis Gandy, RN

## 2020-09-13 NOTE — Telephone Encounter (Signed)
Spoke with the patient to update her that Dr. Baxter Flattery is ordering oral vancomycin. Orders called into pharmacy per Dr. Baxter Flattery.   Beryle Flock, RN

## 2020-09-13 NOTE — Addendum Note (Signed)
Addended by: Lucie Leather D on: 09/13/2020 05:13 PM   Modules accepted: Orders

## 2020-09-13 NOTE — Telephone Encounter (Signed)
Called patient regarding MyChart message. Patient states she was leaking large amounts of blood from her rectum on Sunday, but that it has now improved. The blood soaked her underwear and pants and there were large amounts in the toilet bowl. She states she doesn't have any symptoms of light-headedness or feeling like she's going to pass out. Denies feeling cold or clammy. Patient states that yesterday she was unable to have a bowel movement and that she experienced lots of gas.     RN advised patient that if she starts bleeding like that again or if she develops above-mentioned symptoms to seek care at the ED. Patient states there are just trace amounts of blood in her diarrhea now. She also reports vomiting, but states she is able to keep some food and fluid down. Will route to provider.  Beryle Flock, RN

## 2020-09-13 NOTE — Telephone Encounter (Signed)
Lisa from [facility] called to report patient's C. Diff result is positive. Result available in the chart, will route to provider.   Beryle Flock, RN

## 2020-09-20 ENCOUNTER — Telehealth: Payer: Self-pay | Admitting: *Deleted

## 2020-09-20 NOTE — Telephone Encounter (Signed)
I have spoken to patient to advise that as of right now, we will hold off on upcoming appointment with Dr Hilarie Fredrickson as she has recurrent C Diff and needs FMT as per Dr Baxter Flattery. At this point, patient indicates that she has been told that she is being sent for FMT but has not heard anything further. I advised patient that I will keep an eye on her chart in the upcoming weeks to make certain that we follow up if needed and that we are glad to see her still if Dr Baxter Flattery feels strongly, but at this point, we would not do anything differently with the information we have available to [place]. She verbalizes understanding.

## 2020-09-20 NOTE — Telephone Encounter (Signed)
-----   Message from Jerene Bears, MD sent at 09/20/2020 11:43 AM EST ----- Dr. Baxter Flattery had sent her back to [place] for irritable inconsistent loose stool type pattern in early November not felt to be recurrent C. difficile.  However within the last week she tested positive again for C. difficile despite being treated with multiple prior rounds of vancomycin and Zinplava x2. Given that she has active C. difficile now, her appointment with me can likely be delayed as she has been referred for fecal microbiota transplantation at [facility] or [facility].  Please ensure that this referral is in place for FMT and appointment can be rescheduled. If there are other reasons for this appt that I am unaware of please let me know. Thanks JMP ----- Message ----- From: Larina Bras, CMA Sent: 09/14/2020  11:58 AM EST To: Jerene Bears, MD  Take a look at Ms.Debella's chart and tell me if you think we should hold off on seeing her for now. She is currently on to see you for Thursday, 09/22/20, for "follow up diarrhea, C Diff". However, looks like she was c diff positive again as recently as 09/09/20 and was told in a patient message 09/13/20 that she is being referred to [facility] for FMT.

## 2020-09-22 ENCOUNTER — Ambulatory Visit: Payer: 59 | Admitting: Internal Medicine

## 2020-09-22 NOTE — Telephone Encounter (Signed)
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 We do not do any FMT anymore since we don't have access to [facility]. We refer [facility] to either [facility] or [facility].

## 2020-10-24 ENCOUNTER — Telehealth: Payer: Self-pay

## 2020-10-24 ENCOUNTER — Telehealth: Payer: 59 | Admitting: Internal Medicine

## 2020-10-24 NOTE — Telephone Encounter (Signed)
Per Dr. Drue Second ok to see patient after FMT. Patient made aware via MyChart. Valarie Cones

## 2020-10-24 NOTE — Telephone Encounter (Signed)
Called to confirmed evisit appointment. Patient states she tried to cancel appointment via MyChart. Patient would like to reschedule, but will call to do this at a later time. Patient states she is scheduled for her consult for FMT in March. Patient would like to know if Dr. Drue Second would like to wait and see her again after this or sooner.  Routing message to Dr. Drue Second for advise regarding future appointment. Valarie Cones

## 2020-12-08 ENCOUNTER — Encounter: Payer: Self-pay | Admitting: Family Medicine

## 2020-12-08 ENCOUNTER — Telehealth (INDEPENDENT_AMBULATORY_CARE_PROVIDER_SITE_OTHER): Payer: 59 | Admitting: Family Medicine

## 2020-12-08 VITALS — Temp 95.7°F | Ht 66.0 in | Wt 194.0 lb

## 2020-12-08 DIAGNOSIS — G43909 Migraine, unspecified, not intractable, without status migrainosus: Secondary | ICD-10-CM

## 2020-12-08 DIAGNOSIS — J0100 Acute maxillary sinusitis, unspecified: Secondary | ICD-10-CM

## 2020-12-08 MED ORDER — CEFDINIR 300 MG PO CAPS: 300.0000 mg | ORAL_CAPSULE | Freq: Two times a day (BID) | ORAL | 0 refills | Status: DC

## 2020-12-08 MED ORDER — ONDANSETRON HCL 8 MG PO TABS: 8.0000 mg | ORAL_TABLET | Freq: Three times a day (TID) | ORAL | 2 refills | Status: DC | PRN

## 2020-12-08 MED ORDER — SUMATRIPTAN SUCCINATE 100 MG PO TABS: ORAL_TABLET | ORAL | 2 refills | Status: DC

## 2020-12-08 NOTE — Progress Notes (Signed)
Virtual Visit via Video Note  I connected with Gina Wise on 12/08/20 at  3:30 PM EST by a video enabled telemedicine application and verified that I am speaking with the correct person using two identifiers. Location patient: home Location provider: work  Persons participating in the virtual visit: patient, provider  I discussed the limitations of evaluation and management by telemedicine and the availability of in person appointments. The patient expressed understanding and agreed to proceed.  Chief Complaint  Patient presents with  . Acute Visit    C/o sinus pressure/HA, bad taste in her mouth, pressure around eyes/head, sinus drainage, SOB, everything smells and tastes bad x 2-3 weeks.  She has been taking Zyrtec, benadryl, snius/cold medicine with little relief.    Needs refills on imitrex and zofram.     HPI: Gina Wise is a 60 y.o. female who complains of 2-3 wk h/o HA, sinus and facial pressure, PND, runny nose. Pt also endorses a bad taste in her mouth and states everything tastes and smells off/bad. Lt side of her face beneath her jaw is swollen.  No fever, chills.  She has been taking zyrtec, benadryl, and tylenol cold & sinus med w/ little relief.  She has not done a covid test. She had 2 doses of covid vaccine.   She has a h/o migraine headaches and is requesting refills of imitrex and zofran which she takes PRN. No change from her baseline as far as frequency and intensity.  Past Medical History:  Diagnosis Date  . 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 Allergy   . Anemia   . Bruising 05/10/2015  . CAP (community acquired pneumonia) 08/03/2015  . Chronic bronchitis ([place])    "get it q yr; in the spring time" (02/17/2014)  . COVID-19   . Depression    "have taken zoloft since 1996" (02/17/2014)  . Diverticulosis   . Dyspnea    from scar tissue  . Epistaxis 12/01/2015  . Gastritis   . GERD (gastroesophageal reflux disease)   . Hematemesis with nausea 08/01/2016  . Hiatal  hernia   . High cholesterol   . Hypertension   . Hypothyroid   . Internal hemorrhoids   . Migraines    "maybe a couple/yr" (02/17/2014)  . Multiple environmental allergies   . Overweight(278.02)   . Peri-menopause   . Schatzki's ring   . Sjogren's disease ([place])   . Systemic lupus ([place])     Past Surgical History:  Procedure Laterality Date  . APPENDECTOMY  1987  . ESOPHAGOGASTRODUODENOSCOPY N/A 02/28/2014   Procedure: ESOPHAGOGASTRODUODENOSCOPY (EGD);  Surgeon: Jerene Bears, MD;  [facility] END[facility]COPY;  Service: Endoscopy;  Laterality: N/A;  . ESOPHAGOGASTRODUODENOSCOPY N/A 08/02/2016   Procedure: ESOPHAGOGASTRODUODENOSCOPY (EGD);  Surgeon: Gatha Mayer, MD;  [facility] END[facility]COPY;  Service: Endoscopy;  Laterality: N/A;  . EXPLORATORY LAPAROTOMY  1987  . FOOT SURGERY Right ~1980   "don't know what they did; had to take something out"  . INGUINAL HERNIA REPAIR Right ~ 2003  . RADIOACTIVE SEED GUIDED EXCISIONAL BREAST BIOPSY Left 02/25/2018   Procedure: RADIOACTIVE SEED GUIDED EXCISIONAL BREAST BIOPSY;  Surgeon: Rolm Bookbinder, MD;  Location: [place];  Service: General;  Laterality: Left;  . SHOULDER OPEN ROTATOR CUFF REPAIR Left 1995  . SHOULDER SURGERY Left 1997   "cartilage"  . TONSILLECTOMY AND ADENOIDECTOMY  1976  . TUBAL LIGATION  1998   laparoscopic  . VAGINAL HYSTERECTOMY  04/08/2012   menorrhagia  . VAGINAL PROLAPSE REPAIR  08/04/2018   Anterior and posterior with colp-mesh-Dr. Mikle Bosworth @ [facility]    Family History  Problem Relation Age of Onset  . Hypertension Father   . Vision loss Father   . Alcohol abuse Father   . Emphysema Father        Smoker  . Hearing loss Father   . Early death Father        Murdered  . Hypertension Mother   . Heart disease Mother   . Hepatitis Mother        Hep C  . Endometriosis Mother   . Pancreatic disease Mother   . Depression Mother   . Hearing loss Mother   . Lupus Sister   . Endometriosis  Sister   . Arthritis Sister   . Depression Sister   . Breast cancer Paternal Aunt        31's  . Lung cancer Paternal Uncle        smoker  . Colon cancer Maternal Grandfather   . Uterine cancer Maternal Aunt        50's  . Emphysema Paternal Aunt        Non-smoker/Died of Pneumonia  . Lung cancer Paternal Uncle   . Heart attack Maternal Uncle   . Parkinson's disease Maternal Grandmother   . Alcohol abuse Maternal Grandmother   . Heart disease Paternal Grandmother        Died during angioplasty surgery  . Aneurysm Paternal Grandfather   . Allergy (severe) Son        ASA-Idiopathic Thrombocytopenia  . Early death Maternal Uncle        Murdered  . Lung disease Paternal Aunt        Bronchiecstasis  . Lung cancer Paternal Uncle   . Early death Paternal Uncle        Carbon Monoxide Poisoning/Accidental  . Stomach cancer Neg Hx   . Pancreatic cancer Neg Hx     Social History   Tobacco Use  . Smoking status: Never Smoker  . Smokeless tobacco: Never Used  Vaping Use  . Vaping Use: Never used  Substance Use Topics  . Alcohol use: Not Currently    Alcohol/week: 0.0 standard drinks    Comment: socially  . Drug use: No     Current Outpatient Medications:  .  cetirizine (ZYRTEC) 10 MG tablet, Take 10 mg by mouth daily., Disp: , Rfl:  .  esomeprazole (NEXIUM) 40 MG capsule, TAKE 1 CAPSULE BY MOUTH TWICE DAILY, Disp: 180 capsule, Rfl: 0 .  hydrochlorothiazide (HYDRODIURIL) 25 MG tablet, TAKE 1 TABLET(25 MG) BY MOUTH DAILY, Disp: 90 tablet, Rfl: 1 .  levothyroxine (SYNTHROID) 125 MCG tablet, TAKE 1 TABLET(125 MCG) BY MOUTH DAILY, Disp: 90 tablet, Rfl: 1 .  sertraline (ZOLOFT) 100 MG tablet, Take 1/2 (one-half) tablet by mouth once daily, Disp: 45 tablet, Rfl: 3 .  ondansetron (ZOFRAN) 8 MG tablet, Take 1 tablet (8 mg total) by mouth every 8 (eight) hours as needed for nausea or vomiting., Disp: 60 tablet, Rfl: 2 .  SUMAtriptan (IMITREX) 100 MG tablet, May repeat in 2 hours if  headache persists(Plz sched with new provider), Disp: 12 tablet, Rfl: 2  Allergies  Allergen Reactions  . Contrast Media [Iodinated Diagnostic Agents] Hives and Itching  . Other Hives and Swelling    CATS AND CAT DANDER  . Codeine Nausea Only  . Erythromycin Nausea And Vomiting    Severe abdominal pain  . Iohexol Hives    ROS: See  pertinent positives and negatives per HPI.   EXAM:  VITALS per patient if applicable: Temp (!) 82.0 F (35.4 C) (Oral) Comment: pt reported  Ht 5\' 6"  (1.676 m)   Wt 194 lb (88 kg) Comment: pt reported  LMP 04/03/2012 Comment: Still has Cervix and Ovaries  BMI 31.31 kg/m    GENERAL: alert, oriented, in no acute distress  HEENT: atraumatic, conjunctiva clear, no obvious abnormalities on inspection of external nose and ears  NECK: normal movements of the head and neck  LUNGS: on inspection no signs of respiratory distress, breathing rate appears normal, no obvious gross SOB, gasping or wheezing, no conversational dyspnea  CV: no obvious cyanosis  PSYCH/NEURO: pleasant and cooperative, speech and thought processing grossly intact   ASSESSMENT AND PLAN: 1. Migraine without status migrainosus, not intractable, unspecified migraine type - stable Refill: - SUMAtriptan (IMITREX) 100 MG tablet; May repeat in 2 hours if headache persists(Plz sched with new provider)  Dispense: 12 tablet; Refill: 2 - ondansetron (ZOFRAN) 8 MG tablet; Take 1 tablet (8 mg total) by mouth every 8 (eight) hours as needed for nausea or vomiting.  Dispense: 60 tablet; Refill: 2  2. Acute non-recurrent maxillary sinusitis - cont supportive care - add flonase, mucinex BID Rx: - cefdinir (OMNICEF) 300 MG capsule; Take 1 capsule (300 mg total) by mouth 2 (two) times daily.  Dispense: 20 capsule; Refill: 0 - pt declines prednisone 20mg  daily x 5 days but will f/u if symptoms worsen Discussed plan and reviewed medications with patient, including risks, benefits, and potential  side effects. Pt expressed understand. All questions answered.   I discussed the assessment and treatment plan with the patient. The patient was provided an opportunity to ask questions and all were answered. The patient agreed with the plan and demonstrated an understanding of the instructions.   The patient was advised to call back or seek an in-person evaluation if the symptoms worsen or if the condition fails to improve as anticipated.   Letta Median, DO

## 2020-12-28 ENCOUNTER — Telehealth: Payer: Self-pay

## 2020-12-28 NOTE — Telephone Encounter (Signed)
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 Dr. Grafton Folk please follow up with her on Monday [phone]

## 2021-02-07 ENCOUNTER — Telehealth: Payer: Self-pay

## 2021-02-07 NOTE — Telephone Encounter (Signed)
Refill request received from [facility] for: Sertraline 100 mg tabs LR 11/19/19, #45, 3 rf  ([place]) LOV 12/08/20 FOV none scheduled.  Please review and advise.  Thanks. Dm/cma

## 2021-02-08 MED ORDER — SERTRALINE HCL 100 MG PO TABS: 50.0000 mg | ORAL_TABLET | Freq: Every day | ORAL | 3 refills | Status: DC

## 2021-02-08 NOTE — Addendum Note (Signed)
Addended by: Ronnald Nian on: 02/08/2021 09:02 AM   Modules accepted: Orders

## 2021-02-20 HISTORY — PX: OTHER SURGICAL HISTORY: SHX169

## 2021-02-24 ENCOUNTER — Encounter: Payer: Self-pay | Admitting: Family Medicine

## 2021-03-08 ENCOUNTER — Telehealth: Payer: Self-pay | Admitting: Family Medicine

## 2021-03-08 NOTE — Telephone Encounter (Signed)
Pt called and said that yall had faxed FMLA form to [facility] group, but they said they didn't recieve it and asked for it to be refaxed to [phone]

## 2021-03-08 NOTE — Telephone Encounter (Signed)
Papers re faxed today.

## 2021-03-10 ENCOUNTER — Encounter: Payer: Self-pay | Admitting: Family Medicine

## 2021-03-10 DIAGNOSIS — R7301 Impaired fasting glucose: Secondary | ICD-10-CM

## 2021-03-10 DIAGNOSIS — I10 Essential (primary) hypertension: Secondary | ICD-10-CM

## 2021-03-10 DIAGNOSIS — E785 Hyperlipidemia, unspecified: Secondary | ICD-10-CM

## 2021-03-10 DIAGNOSIS — E6609 Other obesity due to excess calories: Secondary | ICD-10-CM

## 2021-03-23 ENCOUNTER — Other Ambulatory Visit: Payer: Self-pay

## 2021-03-23 ENCOUNTER — Encounter: Payer: Self-pay | Admitting: Family Medicine

## 2021-03-23 ENCOUNTER — Ambulatory Visit: Payer: 59 | Admitting: Family Medicine

## 2021-03-23 VITALS — BP 120/84 | HR 88 | Temp 98.0°F | Ht 66.0 in | Wt 200.4 lb

## 2021-03-23 DIAGNOSIS — R7301 Impaired fasting glucose: Secondary | ICD-10-CM | POA: Diagnosis not present

## 2021-03-23 DIAGNOSIS — E6609 Other obesity due to excess calories: Secondary | ICD-10-CM | POA: Diagnosis not present

## 2021-03-23 DIAGNOSIS — L989 Disorder of the skin and subcutaneous tissue, unspecified: Secondary | ICD-10-CM

## 2021-03-23 DIAGNOSIS — E785 Hyperlipidemia, unspecified: Secondary | ICD-10-CM | POA: Diagnosis not present

## 2021-03-23 DIAGNOSIS — I10 Essential (primary) hypertension: Secondary | ICD-10-CM

## 2021-03-23 DIAGNOSIS — Z6832 Body mass index (BMI) 32.0-32.9, adult: Secondary | ICD-10-CM

## 2021-03-23 MED ORDER — SAXENDA 18 MG/3ML ~~LOC~~ SOPN: PEN_INJECTOR | SUBCUTANEOUS | 2 refills | Status: DC

## 2021-03-23 NOTE — Progress Notes (Signed)
Gina Wise is a 60 y.o. female  Chief Complaint  Patient presents with  . Obesity    Pt here to discuss weight gain.    HPI: Gina Wise is a 60 y.o. female patient seen today to discuss concerns about her weight. Pt states she is "hungry all of the time" and "thinks about food 24/7". She overeats at times. "mindless eating" while working. 34mo of c diff infection. Finally treated with OFT. Unable to eat raw veggies. She was on phentermine in the past.   Diet: has cut out soda/coke, drinking more water; volume is an issue, food choices also problematic.  Exercise: no regular exercise, does have a pool   Wt Readings from Last 3 Encounters:  03/23/21 200 lb 6.4 oz (90.9 kg)  12/08/20 194 lb (88 kg)  08/24/20 196 lb (88.9 kg)   Estimated body mass index is 32.35 kg/m as calculated from the following:   Height as of this encounter: 5\' 6"  (1.676 m).   Weight as of this encounter: 200 lb 6.4 oz (90.9 kg).   Past Medical History:  Diagnosis Date  . 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 Allergy   . Anemia   . Bruising 05/10/2015  . CAP (community acquired pneumonia) 08/03/2015  . Chronic bronchitis ([place])    "get it q yr; in the spring time" (02/17/2014)  . COVID-19   . Depression    "have taken zoloft since 1996" (02/17/2014)  . Diverticulosis   . Dyspnea    from scar tissue  . Epistaxis 12/01/2015  . Gastritis   . GERD (gastroesophageal reflux disease)   . Hematemesis with nausea 08/01/2016  . Hiatal hernia   . High cholesterol   . Hypertension   . Hypothyroid   . Internal hemorrhoids   . Migraines    "maybe a couple/yr" (02/17/2014)  . Multiple environmental allergies   . Overweight(278.02)   . Peri-menopause   . Schatzki's ring   . Sjogren's disease ([place])   . Systemic lupus ([place])     Past Surgical History:  Procedure Laterality Date  . APPENDECTOMY  1987  . ESOPHAGOGASTRODUODENOSCOPY N/A 02/28/2014   Procedure: ESOPHAGOGASTRODUODENOSCOPY (EGD);  Surgeon: Jerene Bears, MD;  Location: [facility] ENDOSCOPY;  Service: Endoscopy;  Laterality: N/A;  . ESOPHAGOGASTRODUODENOSCOPY N/A 08/02/2016   Procedure: ESOPHAGOGASTRODUODENOSCOPY (EGD);  Surgeon: Gatha Mayer, MD;  Location: [facility] ENDOSCOPY;  Service: Endoscopy;  Laterality: N/A;  . EXPLORATORY LAPAROTOMY  1987  . 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 FOOT SURGERY Right ~1980   "don't know what they did; had to take something out"  . INGUINAL HERNIA REPAIR Right ~ 2003  . RADIOACTIVE SEED GUIDED EXCISIONAL BREAST BIOPSY Left 02/25/2018   Procedure: RADIOACTIVE SEED GUIDED EXCISIONAL BREAST BIOPSY;  Surgeon: Rolm Bookbinder, MD;  Location: [place];  Service: General;  Laterality: Left;  . SHOULDER OPEN ROTATOR CUFF REPAIR Left 1995  . SHOULDER SURGERY Left 1997   "cartilage"  . TONSILLECTOMY AND ADENOIDECTOMY  1976  . TUBAL LIGATION  1998   laparoscopic  . VAGINAL HYSTERECTOMY  04/08/2012   menorrhagia  . VAGINAL PROLAPSE REPAIR  08/04/2018   Anterior and posterior with colp-mesh-Dr. Mikle Bosworth @ [facility]    Social History   Socioeconomic History  . Marital status: Single    Spouse name: Not on file  . Number of children: Not on file  . Years of education: Not on file  . Highest education level: Not on file  Occupational History  .  Not on file  Tobacco Use  . Smoking status: Never Smoker  . Smokeless tobacco: Never Used  Vaping Use  . Vaping Use: Never used  Substance and Sexual Activity  . Alcohol use: Not Currently    Alcohol/week: 0.0 standard drinks    Comment: socially  . Drug use: No  . Sexual activity: Not on file  Other Topics Concern  . Not on file  Social History Narrative   Works at [employer] Strain: Not on file  Food Insecurity: Not on file  Transportation Needs: Not on file  Physical Activity: Not on file  Stress: Not on file  Social Connections: Not on file  Intimate Partner  Violence: Not on file    Family History  Problem Relation Age of Onset  . Hypertension Father   . Vision loss Father   . Alcohol abuse Father   . Emphysema Father        Smoker  . Hearing loss Father   . Early death Father        Murdered  . Hypertension Mother   . Heart disease Mother   . Hepatitis Mother        Hep C  . Endometriosis Mother   . Pancreatic disease Mother   . Depression Mother   . Hearing loss Mother   . Lupus Sister   . Endometriosis Sister   . Arthritis Sister   . Depression Sister   . Breast cancer Paternal Aunt        66's  . Lung cancer Paternal Uncle        smoker  . Colon cancer Maternal Grandfather   . Uterine cancer Maternal Aunt        50's  . Emphysema Paternal Aunt        Non-smoker/Died of Pneumonia  . Lung cancer Paternal Uncle   . Heart attack Maternal Uncle   . Parkinson's disease Maternal Grandmother   . Alcohol abuse Maternal Grandmother   . Heart disease Paternal Grandmother        Died during angioplasty surgery  . Aneurysm Paternal Grandfather   . Allergy (severe) Son        ASA-Idiopathic Thrombocytopenia  . Early death Maternal Uncle        Murdered  . Lung disease Paternal Aunt        Bronchiecstasis  . Lung cancer Paternal Uncle   . Early death Paternal Uncle        Carbon Monoxide Poisoning/Accidental  . Stomach cancer Neg Hx   . Pancreatic cancer Neg Hx      Immunization History  Administered Date(s) Administered  . Influenza, Quadrivalent, Recombinant, Inj, Pf 08/07/2017  . Influenza,inj,Quad PF,6+ Mos 08/07/2017  . Influenza-Unspecified 10/08/2019  . PFIZER(Purple Top)SARS-COV-2 Vaccination 01/14/2020, 02/08/2020  . Pneumococcal Polysaccharide-23 10/06/2016  . Td 03/13/2010    Outpatient Encounter Medications as of 03/23/2021  Medication Sig  . cetirizine (ZYRTEC) 10 MG tablet Take 10 mg by mouth daily.  Marland Kitchen esomeprazole (NEXIUM) 40 MG capsule TAKE 1 CAPSULE BY MOUTH TWICE DAILY  . hydrochlorothiazide  (HYDRODIURIL) 25 MG tablet TAKE 1 TABLET(25 MG) BY MOUTH DAILY  . levothyroxine (SYNTHROID) 125 MCG tablet TAKE 1 TABLET(125 MCG) BY MOUTH DAILY  . loratadine (CLARITIN) 10 MG tablet Take by mouth.  . ondansetron (ZOFRAN) 8 MG tablet Take 1 tablet (8 mg total) by mouth every 8 (eight) hours as needed for nausea or vomiting.  Marland Kitchen  sertraline (ZOLOFT) 100 MG tablet Take 0.5 tablets (50 mg total) by mouth daily. Take 1/2 (one-half) tablet by mouth once daily  . SUMAtriptan (IMITREX) 100 MG tablet May repeat in 2 hours if headache persists(Plz sched with new provider)  . [DISCONTINUED] cefdinir (OMNICEF) 300 MG capsule Take 1 capsule (300 mg total) by mouth 2 (two) times daily.   No facility-administered encounter medications on file as of 03/23/2021.     ROS: Pertinent positives and negatives noted in HPI. Remainder of ROS non-contributory    Allergies  Allergen Reactions  . Contrast Media [Iodinated Diagnostic Agents] Hives and Itching  . Other Hives and Swelling    CATS AND CAT DANDER  . Codeine Nausea Only  . Erythromycin Nausea And Vomiting    Severe abdominal pain  . Iohexol Hives    BP 120/84 (BP Location: Left Arm, Patient Position: Sitting, Cuff Size: Normal)   Pulse 88   Temp 98 F (36.7 C) (Temporal)   Ht 5\' 6"  (1.676 m)   Wt 200 lb 6.4 oz (90.9 kg)   LMP 04/03/2012 Comment: Still has Cervix and Ovaries  SpO2 98%   BMI 32.35 kg/m   Physical Exam Constitutional:      General: She is not in acute distress.    Appearance: Normal appearance. She is obese. She is not ill-appearing.  Pulmonary:     Effort: No respiratory distress.  Neurological:     Mental Status: She is alert and oriented to person, place, and time.  Psychiatric:        Mood and Affect: Mood normal.        Behavior: Behavior normal.      A/P:  1. Class 1 obesity due to excess calories without serious comorbidity with body mass index (BMI) of 32.0 to 32.9 in adult 2. Hyperlipidemia, unspecified  hyperlipidemia type 3. Essential hypertension, benign 4. IFG (impaired fasting glucose) Rx: - Liraglutide -Weight Management (SAXENDA) 18 MG/3ML SOPN; 0.6MG  [place] DAILY X 1 WEEK THEN INCREASE TO 1.2MG  [place] DAILY x 1 week then 1.8mg  [place] daily x 1 week  Dispense: 3 mL; Refill: 2  5. External nasal lesion - Ambulatory referral to Dermatology    This visit occurred during the SARS-CoV-2 public health emergency.  Safety protocols were in place, including screening questions prior to the visit, additional usage of staff PPE, and extensive cleaning of exam room while observing appropriate contact time as indicated for disinfecting solutions.

## 2021-03-24 ENCOUNTER — Encounter: Payer: Self-pay | Admitting: Family Medicine

## 2021-03-24 ENCOUNTER — Telehealth: Payer: Self-pay

## 2021-03-24 DIAGNOSIS — R7301 Impaired fasting glucose: Secondary | ICD-10-CM

## 2021-03-24 DIAGNOSIS — E6609 Other obesity due to excess calories: Secondary | ICD-10-CM

## 2021-03-24 DIAGNOSIS — I10 Essential (primary) hypertension: Secondary | ICD-10-CM

## 2021-03-24 DIAGNOSIS — E785 Hyperlipidemia, unspecified: Secondary | ICD-10-CM

## 2021-03-24 MED ORDER — SAXENDA 18 MG/3ML ~~LOC~~ SOPN
PEN_INJECTOR | SUBCUTANEOUS | 2 refills | Status: DC
Start: 2021-03-24 — End: 2021-03-24

## 2021-03-24 MED ORDER — SAXENDA 18 MG/3ML ~~LOC~~ SOPN: PEN_INJECTOR | SUBCUTANEOUS | 2 refills | Status: DC

## 2021-03-24 NOTE — Telephone Encounter (Signed)
Pt has cut out soda/coke, drinking more water. She is walking dog 1-3x/day. She has lost 4lbs in the pat month with lifestyle changes. She has impaired fasting glucose, HTN, hyperlipidemia which would all improve/benefit from weight loss  Resent Rx with info in notes about pts lifestyles changes. Please include this info in PA

## 2021-03-24 NOTE — Telephone Encounter (Signed)
PA for Saxenda submitted through cover my meds to [employer].  Awaiting response. Dm/cma  Key: [id number])

## 2021-03-24 NOTE — Telephone Encounter (Signed)
PA re-sumbitted through cover my meds to [facility]. Awaiting response.   Dm/cma  (Key: [id number])

## 2021-03-24 NOTE — Telephone Encounter (Signed)
PA denied due to not meeting plans criteria.  Message sent to provider through my chart.  Dm/cma

## 2021-03-28 NOTE — Telephone Encounter (Signed)
PA approved from 03/25/21 -09/23/2021. Called pharmacy and it went through for $100 and they will get it ready for her.   Patient notified [place] phone. Dm/cma

## 2021-03-29 NOTE — Telephone Encounter (Signed)
Can you please call pts pharmacy to change saenda Rx to dispense 1 box (not 1 pen) with 3RF? Thank you!

## 2021-03-31 MED ORDER — SAXENDA 18 MG/3ML ~~LOC~~ SOPN: PEN_INJECTOR | SUBCUTANEOUS | 3 refills | Status: DC

## 2021-03-31 NOTE — Telephone Encounter (Signed)
Pt calling and said that she talked to pharmacy and that they need the saxenda prescription written 49ml equals 1 box which is 5 pens and 50ml per pen. Pt said that the $100 was for just 1 pen

## 2021-03-31 NOTE — Telephone Encounter (Signed)
Pt called and asked to speak with you about saxenda. She states that the pharmacist told her that there is a certain way that it has to be written in order to have the lower copay. Please advise

## 2021-03-31 NOTE — Addendum Note (Signed)
Addended by: Ronnald Nian on: 03/31/2021 10:37 AM   Modules accepted: Orders

## 2021-03-31 NOTE — Telephone Encounter (Signed)
Rx sent as requested.

## 2021-04-03 NOTE — Telephone Encounter (Signed)
Please see Mychart message.

## 2021-04-12 ENCOUNTER — Encounter: Payer: Self-pay | Admitting: Family Medicine

## 2021-04-12 MED ORDER — NOVOFINE PEN NEEDLE 32G X 6 MM MISC: 1 refills | Status: DC

## 2021-04-28 ENCOUNTER — Encounter: Payer: Self-pay | Admitting: Family Medicine

## 2021-05-05 ENCOUNTER — Encounter: Payer: Self-pay | Admitting: Family Medicine

## 2021-06-09 ENCOUNTER — Other Ambulatory Visit: Payer: Self-pay | Admitting: Internal Medicine

## 2021-06-12 ENCOUNTER — Telehealth: Payer: Self-pay | Admitting: Family Medicine

## 2021-06-12 NOTE — Telephone Encounter (Signed)
LAST APPOINTMENT DATE:  03/2021 with Dr. Bryan Lemma  NEXT APPOINTMENT DATE: 08/17/21 T[facility]  MEDICATION: levothyroxine and hctz - pt has 3 days of each med  PHARMACY: [facility] [id number] - [place], [place] - [place]  pt has already called pharmacy and no electronic request was sent  notified patient to allow 48-72 hours to process

## 2021-06-13 ENCOUNTER — Telehealth: Payer: Self-pay

## 2021-06-13 ENCOUNTER — Other Ambulatory Visit: Payer: Self-pay | Admitting: Family

## 2021-06-13 MED ORDER — LEVOTHYROXINE SODIUM 125 MCG PO TABS: ORAL_TABLET | ORAL | 1 refills | Status: DC

## 2021-06-13 MED ORDER — HYDROCHLOROTHIAZIDE 25 MG PO TABS: ORAL_TABLET | ORAL | 1 refills | Status: DC

## 2021-06-13 NOTE — Telephone Encounter (Signed)
Refill request for:   Hydrochlorothiazide 25 mg LR  04/18/20, #90, 1 RF  Levothyroxine 0.125 mg LR 04/16/20, #90,1 RF LOV 03/23/21 FOV  08/17/21 Gina Wise)  [facility]   [address]  Please review and advise. Thanks.  Dm/cma

## 2021-06-13 NOTE — Telephone Encounter (Signed)
Called patient to advised that RX was sent to the pharmacy. She advised that they were supposed to go to  [facility] on [address] [place] to cancel both RX's and re-sent to the correct pharmacy.  Dm/cma

## 2021-06-13 NOTE — Addendum Note (Signed)
Addended by: Konrad Saha on: 06/13/2021 04:49 PM   Modules accepted: Orders

## 2021-06-16 NOTE — Telephone Encounter (Signed)
Medication filled by P.Webb on 06/13/21

## 2021-08-03 ENCOUNTER — Other Ambulatory Visit (HOSPITAL_BASED_OUTPATIENT_CLINIC_OR_DEPARTMENT_OTHER): Payer: Self-pay | Admitting: Obstetrics & Gynecology

## 2021-08-17 ENCOUNTER — Encounter: Payer: 59 | Admitting: Nurse Practitioner

## 2021-08-24 ENCOUNTER — Encounter: Payer: Self-pay | Admitting: Nurse Practitioner

## 2021-09-04 ENCOUNTER — Encounter: Payer: 59 | Admitting: Obstetrics & Gynecology

## 2021-10-29 ENCOUNTER — Encounter (INDEPENDENT_AMBULATORY_CARE_PROVIDER_SITE_OTHER): Payer: 59 | Admitting: Nurse Practitioner

## 2021-10-29 DIAGNOSIS — E785 Hyperlipidemia, unspecified: Secondary | ICD-10-CM

## 2021-10-29 DIAGNOSIS — Z6832 Body mass index (BMI) 32.0-32.9, adult: Secondary | ICD-10-CM

## 2021-10-29 DIAGNOSIS — Z6831 Body mass index (BMI) 31.0-31.9, adult: Secondary | ICD-10-CM

## 2021-10-29 DIAGNOSIS — I10 Essential (primary) hypertension: Secondary | ICD-10-CM

## 2021-10-29 DIAGNOSIS — E6609 Other obesity due to excess calories: Secondary | ICD-10-CM | POA: Diagnosis not present

## 2021-10-29 DIAGNOSIS — R7301 Impaired fasting glucose: Secondary | ICD-10-CM

## 2021-11-02 MED ORDER — SAXENDA 18 MG/3ML ~~LOC~~ SOPN: 2.4000 mg | PEN_INJECTOR | Freq: Every day | SUBCUTANEOUS | 0 refills | Status: DC

## 2021-11-02 MED ORDER — NOVOFINE PEN NEEDLE 32G X 6 MM MISC: 1.0000 [IU] | Freq: Every day | 0 refills | Status: DC

## 2021-11-02 NOTE — Telephone Encounter (Signed)
Please see the MyChart message reply(ies) for my assessment and plan.  The patient gave consent for this Medical Advice Message and is aware that it may result in a bill to their insurance company as well as the possibility that this may result in a co-payment or deductible. They are an established patient, but are not seeking medical advice exclusively about a problem treated during an in person or video visit in the last 7 days. I did not recommend an in person or video visit within 7 days of my reply.  BMI 31.0-31.9,adult Current use of Saxenda 2.4mg  daily Denies any adverse side effects. Reports current weight at 170lbs from 205lbs. 35lbs weight loss in last 75months. Exercise regimen: walking 3-4x/day Diet: decreased meal portions  Saxenda refill sent F/up on 11/22/2021 as scheduled Will need  Repeat CMP and hgbA1c during this appt.   Cumulative time spent within 7 days through [employer] 15 minutes Wilfred Lacy, NP

## 2021-11-02 NOTE — Assessment & Plan Note (Addendum)
Current use of Saxenda 2.4mg  daily Denies any adverse side effects. Reports current weight at 170lbs from 205lbs. 35lbs weight loss in last 31months. Exercise regimen: walking 3-4x/day Diet: decreased meal portions  Saxenda refill sent F/up on 11/22/2021 as scheduled Will need  Repeat CMP and hgbA1c during this appt.

## 2021-11-08 ENCOUNTER — Encounter (INDEPENDENT_AMBULATORY_CARE_PROVIDER_SITE_OTHER): Payer: 59 | Admitting: Nurse Practitioner

## 2021-11-08 ENCOUNTER — Ambulatory Visit: Payer: 59 | Admitting: Obstetrics & Gynecology

## 2021-11-08 DIAGNOSIS — R197 Diarrhea, unspecified: Secondary | ICD-10-CM

## 2021-11-10 NOTE — Telephone Encounter (Signed)
Please see the MyChart message reply(ies) for my assessment and plan.  The patient gave consent for this Medical Advice Message and is aware that it may result in a bill to their insurance company as well as the possibility that this may result in a co-payment or deductible. They are an established patient, but are not seeking medical advice exclusively about a problem treated during an in person or video visit in the last 7 days. I did not recommend an in person or video visit within 7 days of my reply.  Cumulative time spent within 7 days through [employer] 30 minutes Gina Lacy, NP

## 2021-11-10 NOTE — Addendum Note (Signed)
Addended by: Leana Gamer on: 11/10/2021 08:24 AM   Modules accepted: Orders

## 2021-11-13 ENCOUNTER — Ambulatory Visit (INDEPENDENT_AMBULATORY_CARE_PROVIDER_SITE_OTHER): Payer: 59

## 2021-11-13 ENCOUNTER — Other Ambulatory Visit: Payer: Self-pay

## 2021-11-13 DIAGNOSIS — R059 Cough, unspecified: Secondary | ICD-10-CM | POA: Diagnosis not present

## 2021-11-13 DIAGNOSIS — G4459 Other complicated headache syndrome: Secondary | ICD-10-CM | POA: Diagnosis not present

## 2021-11-14 LAB — POC COVID19 BINAXNOW: SARS Coronavirus 2 Ag: NEGATIVE

## 2021-11-14 LAB — POCT INFLUENZA A/B
Influenza A, POC: NEGATIVE
Influenza B, POC: NEGATIVE

## 2021-11-21 ENCOUNTER — Telehealth: Payer: Self-pay | Admitting: Nurse Practitioner

## 2021-11-22 ENCOUNTER — Encounter: Payer: Self-pay | Admitting: Nurse Practitioner

## 2021-11-22 ENCOUNTER — Other Ambulatory Visit: Payer: Self-pay

## 2021-11-22 ENCOUNTER — Ambulatory Visit: Payer: 59 | Admitting: Nurse Practitioner

## 2021-11-22 VITALS — BP 136/84 | HR 88 | Temp 96.8°F | Ht 65.5 in | Wt 177.4 lb

## 2021-11-22 DIAGNOSIS — J3081 Allergic rhinitis due to animal (cat) (dog) hair and dander: Secondary | ICD-10-CM

## 2021-11-22 MED ORDER — AZELASTINE-FLUTICASONE 137-50 MCG/ACT NA SUSP: 1.0000 | Freq: Two times a day (BID) | NASAL | 1 refills | Status: DC

## 2021-11-22 MED ORDER — SALINE SPRAY 0.65 % NA SOLN
1.0000 | NASAL | 0 refills | Status: DC | PRN
Start: 2021-11-22 — End: 2022-05-21

## 2021-11-22 NOTE — Progress Notes (Signed)
Subjective:  Patient ID: Gina Wise, female    DOB: 1961/05/02  Age: 61 y.o. MRN: [id number]  CC: Establish Care (TOC-Dr. Cirigliano/Pt c/o lump under right jaw and arm pit and would like to have this looked at/Pt has some discomfort in chest and rib area. )  Sinus Problem This is a recurrent problem. The current episode started more than 1 month ago. The problem has been waxing and waning since onset. There has been no fever. Associated symptoms include congestion, sinus pressure, sneezing and a sore throat. Pertinent negatives include no chills, coughing, diaphoresis, hoarse voice, neck pain, shortness of breath or swollen glands. Treatments tried: OTC antihistamine.  Hx of environmental allergen: cat dander, dust, pollen. She lives with a cat and dog, and exposure to second hand tobacco smoke. POC Influenza and covid test were negative on 11/13/2021.  She is also concerned about a nodule under right cheek and right axilla region. No pain, no change in size, no joint pain, no teeth pain, no gum swelling.  Wt Readings from Last 3 Encounters:  11/22/21 177 lb 6.4 oz (80.5 kg)  03/23/21 200 lb 6.4 oz (90.9 kg)  12/08/20 194 lb (88 kg)   Reviewed past Medical, Social and Family history today.  Outpatient Medications Prior to Visit  Medication Sig Dispense Refill   cetirizine (ZYRTEC) 10 MG tablet Take 10 mg by mouth daily.     esomeprazole (NEXIUM) 40 MG capsule TAKE 1 CAPSULE BY MOUTH TWICE DAILY 180 capsule 0   hydrochlorothiazide (HYDRODIURIL) 25 MG tablet TAKE 1 TABLET(25 MG) BY MOUTH DAILY 90 tablet 1   levothyroxine (SYNTHROID) 125 MCG tablet TAKE 1 TABLET(125 MCG) BY MOUTH DAILY 90 tablet 1   loratadine (CLARITIN) 10 MG tablet Take by mouth.     ondansetron (ZOFRAN) 8 MG tablet Take 1 tablet (8 mg total) by mouth every 8 (eight) hours as needed for nausea or vomiting. 60 tablet 2   sertraline (ZOLOFT) 100 MG tablet Take 0.5 tablets (50 mg total) by mouth daily. Take 1/2 (one-half)  tablet by mouth once daily 45 tablet 3   SUMAtriptan (IMITREX) 100 MG tablet May repeat in 2 hours if headache persists(Plz sched with new provider) 12 tablet 2   Insulin Pen Needle (NOVOFINE PEN NEEDLE) 32G X 6 MM MISC Inject 1 Units into the skin daily at 12 noon. (Patient not taking: Reported on 11/22/2021) 100 each 0   Liraglutide -Weight Management (SAXENDA) 18 MG/3ML SOPN Inject 2.4 mg into the skin daily at 12 noon. (Patient not taking: Reported on 11/22/2021) 15 mL 0   No facility-administered medications prior to visit.   ROS See HPI  Objective:  BP 136/84 (BP Location: Left Arm, Patient Position: Sitting, Cuff Size: Normal)    Pulse 88    Temp (!) 96.8 F (36 C) (Temporal)    Ht 5' 5.5" (1.664 m)    Wt 177 lb 6.4 oz (80.5 kg)    LMP 04/03/2012 Comment: Still has Cervix and Ovaries   SpO2 98%    BMI 29.07 kg/m   Physical Exam HENT:     Head:     Jaw: There is normal jaw occlusion.     Salivary Glands: Right salivary gland is not diffusely enlarged or tender. Left salivary gland is not diffusely enlarged or tender.     Comments: Palpable submental lymph node: soft, mobile, non tender, no erythema on skin, <91mm in diameter    Right Ear: Hearing, tympanic membrane, ear canal and external ear  normal.     Left Ear: Hearing, tympanic membrane, ear canal and external ear normal.  Eyes:     General: Allergic shiner present.  Cardiovascular:     Rate and Rhythm: Normal rate and regular rhythm.     Pulses: Normal pulses.     Heart sounds: Normal heart sounds.  Pulmonary:     Effort: Pulmonary effort is normal.     Breath sounds: Normal breath sounds.  Chest:     Comments: No lymphadenopathy. Subcutaneous cyst palpated in right axilla region: mobile, non tender, no erythema, no skin retraction Lymphadenopathy:     Upper Body:     Right upper body: No supraclavicular, axillary or pectoral adenopathy.     Left upper body: No supraclavicular, axillary or pectoral adenopathy.   Neurological:     Mental Status: She is alert.   Assessment & Plan:  This visit occurred during the SARS-CoV-2 public health emergency.  Safety protocols were in place, including screening questions prior to the visit, additional usage of staff PPE, and extensive cleaning of exam room while observing appropriate contact time as indicated for disinfecting solutions.   Gina Wise was seen today for establish care.  Diagnoses and all orders for this visit:  Allergic rhinitis due to animal hair and dander -     Ambulatory referral to Allergy -     Azelastine-Fluticasone 137-50 MCG/ACT SUSP; Place 1 spray into the nose every 12 (twelve) hours. -     sodium chloride (OCEAN) 0.65 % SOLN nasal spray; Place 1 spray into both nostrils as needed for congestion.  We dicussed ways to minimize dander and dust in her home. She agreed to changing her home filter monthly, and getting an air purifier for her home. Nodule under chin and axilary region are benign. No additional eval needed unless it changes in size or becomes painful.  Problem List Items Addressed This Visit       Respiratory   Allergic rhinitis - Primary   Relevant Medications   Azelastine-Fluticasone 137-50 MCG/ACT SUSP   sodium chloride (OCEAN) 0.65 % SOLN nasal spray   Other Relevant Orders   Ambulatory referral to Allergy    Follow-up: Return in about 4 weeks (around 12/20/2021) for CPE (fasting).  Wilfred Lacy, NP

## 2021-11-22 NOTE — Patient Instructions (Addendum)
You will be contacted to schedule appt with allergist.  Nodule under chin and axilary region are benign. No additional eval needed unless it changes in size or becomes painful.  Allergic Rhinitis, Adult Allergic rhinitis is an allergic reaction that affects the mucous membrane inside the nose. The mucous membrane is the tissue that produces mucus. There are two types of allergic rhinitis: Seasonal. This type is also called hay fever and happens only during certain seasons. Perennial. This type can happen at any time of the year. Allergic rhinitis cannot be spread from person to person. This condition can be mild, moderate, or severe. It can develop at any age and may be outgrown. What are the causes? This condition is caused by allergens. These are things that can cause an allergic reaction. Allergens may differ for seasonal allergic rhinitis and perennial allergic rhinitis. Seasonal allergic rhinitis is triggered by pollen. Pollen can come from grasses, trees, and weeds. Perennial allergic rhinitis may be triggered by: Dust mites. Proteins in a pet's urine, saliva, or dander. Dander is dead skin cells from a pet. Smoke, mold, or car fumes. What increases the risk? You are more likely to develop this condition if you have a family history of allergies or other conditions related to allergies, including: Allergic conjunctivitis. This is inflammation of parts of the eyes and eyelids. Asthma. This condition affects the lungs and makes it hard to breathe. Atopic dermatitis or eczema. This is long term (chronic) inflammation of the skin. Food allergies. What are the signs or symptoms? Symptoms of this condition include: Sneezing or coughing. A stuffy nose (nasal congestion), itchy nose, or nasal discharge. Itchy eyes and tearing of the eyes. A feeling of mucus dripping down the back of your throat (postnasal drip). Trouble sleeping. Tiredness or fatigue. Headache. Sore throat. How is this  diagnosed? This condition may be diagnosed with your symptoms, medical history, and physical exam. Your health care provider may check for related conditions, such as: Asthma. Pink eye. This is eye inflammation caused by infection (conjunctivitis). Ear infection. Upper respiratory infection. This is an infection in the nose, throat, or upper airways. You may also have tests to find out which allergens trigger your symptoms. These may include skin tests or blood tests. How is this treated? There is no cure for this condition, but treatment can help control symptoms. Treatment may include: Taking medicines that block allergy symptoms, such as corticosteroids and antihistamines. Medicine may be given as a shot, nasal spray, or pill. Avoiding any allergens. Being exposed again and again to tiny amounts of allergens to help you build a defense against allergens (immunotherapy). This is done if other treatments have not helped. It may include: Allergy shots. These are injected medicines that have small amounts of allergen in them. Sublingual immunotherapy. This involves taking small doses of a medicine with allergen in it under your tongue. If these treatments do not work, your health care provider may prescribe newer, stronger medicines. Follow these instructions at home: Avoiding allergens Find out what you are allergic to and avoid those allergens. These are some things you can do to help avoid allergens: If you have perennial allergies: Replace carpet with wood, tile, or vinyl flooring. Carpet can trap dander and dust. Do not smoke. Do not allow smoking in your home. Change your heating and air conditioning filters at least once a month. If you have seasonal allergies, take these steps during allergy season: Keep windows closed as much as possible. Plan outdoor activities when  pollen counts are lowest. Check pollen counts before you plan outdoor activities. When coming indoors, change clothing  and shower before sitting on furniture or bedding. If you have a pet in the house that produces allergens: Keep the pet out of the bedroom. Vacuum, sweep, and dust regularly. General instructions Take over-the-counter and prescription medicines only as told by your health care provider. Drink enough fluid to keep your urine pale yellow. Keep all follow-up visits as told by your health care provider. This is important. Where to find more information American Academy of Allergy, Asthma & Immunology: www.aaaai.org Contact a health care provider if: You have a fever. You develop a cough that does not go away. You make whistling sounds when you breathe (wheeze). Your symptoms slow you down or stop you from doing your normal activities each day. Get help right away if: You have shortness of breath. This symptom may represent a serious problem that is an emergency. Do not wait to see if the symptom will go away. Get medical help right away. Call your local emergency services (911 in the [place] Do not drive yourself to the hospital. Summary Allergic rhinitis may be managed by taking medicines as directed and avoiding allergens. If you have seasonal allergies, keep windows closed as much as possible during allergy season. Contact your health care provider if you develop a fever or a cough that does not go away. This information is not intended to replace advice given to you by your health care provider. Make sure you discuss any questions you have with your health care provider. Document Revised: 11/27/2019 Document Reviewed: 10/06/2019 Elsevier Patient Education  2022 [employer].

## 2021-11-23 ENCOUNTER — Encounter: Payer: Self-pay | Admitting: Nurse Practitioner

## 2021-11-24 NOTE — Telephone Encounter (Signed)
Will go to website and resubmit PA

## 2021-11-29 ENCOUNTER — Telehealth: Payer: Self-pay | Admitting: Nurse Practitioner

## 2021-11-29 NOTE — Telephone Encounter (Signed)
Patient would like to do a transfer of care from [facility] to [facility].

## 2021-11-29 NOTE — Telephone Encounter (Signed)
I contacted Gina Wise to discuss diagnosis on for FMLA form. I asked when was her last appt with rheumatology? She stated, it has been over 32yrs. I informed her that based on available documentation from Dr. Denman George (Rheumatology with [facility]), she was diagnosed with Primary Sjogren Syndrome. Systemic Lupus was ruled out. Her last office visit was 08/21/2017. For this reason I can not include Systemic Lupus on her FMLA form. I also inform her that based on Dr. Francella Solian. Clair's note in 2018 and radiology reports (CT chest 2018 by [facility] and CXR 2020 by [facility]), there was no evidence of connective tissue related to Insterstitial lung disease. For this reason I can not include that in her FMLA form. 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 Gina Wise stated she was diagnosed by Dr. Frederica Kuster in 2016. She stated her form can not be altered despite the above information. She stated I am not providing the new diagnosis, hence her forms need to be completed with all the above diagnosis since 2016. If this is not included it will be denied. This will put her job in jeopardy. She does not understand why I can not help her maintain her job. I advised Gina Wise that I need to complete the form with current and accurate diagnosis. Gina Wise became upset, stating I should ripe the from and discard. She does not want me to complete her form. She will find another provider. She proceeded to hang up the phone before I could respond.

## 2021-12-06 ENCOUNTER — Encounter: Payer: Self-pay | Admitting: Obstetrics & Gynecology

## 2021-12-06 ENCOUNTER — Other Ambulatory Visit: Payer: Self-pay | Admitting: Obstetrics & Gynecology

## 2021-12-06 ENCOUNTER — Other Ambulatory Visit: Payer: Self-pay

## 2021-12-06 ENCOUNTER — Ambulatory Visit (INDEPENDENT_AMBULATORY_CARE_PROVIDER_SITE_OTHER): Payer: 59 | Admitting: Obstetrics & Gynecology

## 2021-12-06 VITALS — BP 138/83 | HR 86 | Wt 182.0 lb

## 2021-12-06 DIAGNOSIS — Z01419 Encounter for gynecological examination (general) (routine) without abnormal findings: Secondary | ICD-10-CM

## 2021-12-06 DIAGNOSIS — N6331 Unspecified lump in axillary tail of the right breast: Secondary | ICD-10-CM

## 2021-12-06 DIAGNOSIS — Z01411 Encounter for gynecological examination (general) (routine) with abnormal findings: Secondary | ICD-10-CM

## 2021-12-06 DIAGNOSIS — E049 Nontoxic goiter, unspecified: Secondary | ICD-10-CM

## 2021-12-06 NOTE — Progress Notes (Signed)
Subjective:     Gina Wise is a 61 y.o. female here for a routine exam.  Current complaints: pt reprots a prolonged adverse health record since her last visit. She feels great now. Has COVID followed by c diff. Is s/p breast bx and is now on FMLA.      Gynecologic History Patient's last menstrual period was 04/03/2012. Contraception: status post hysterectomy Last Pap: 2012. Results were: normal Last mammogram: 01/29/2018. Results were: Study Result  Narrative & Impression  CLINICAL DATA:  61 year old female for evaluation of possible LEFT breast mass on screening mammogram.   EXAM: DIGITAL DIAGNOSTIC LEFT MAMMOGRAM WITH CAD AND TOMO   ULTRASOUND LEFT BREAST   COMPARISON:  Previous exam(s).   ACR Breast Density Category b: There are scattered areas of fibroglandular density.   FINDINGS: 2D and 3D spot compression views of the LEFT breast demonstrate apparent distortion within the UPPER-OUTER LEFT breast, best identified on the MLO view.   Mammographic images were processed with CAD.   Targeted ultrasound is performed, showing no suspicious mass, distortion or abnormal shadowing within the UPPER-OUTER LEFT breast.   No abnormal LEFT axillary lymph nodes are identified.   IMPRESSION: Apparent distortion within the UPPER-OUTER LEFT breast. Tissue sampling is recommended.   No abnormal LEFT axillary lymph nodes.   RECOMMENDATION: Stereotactic/3D guided LEFT breast biopsy, which will be scheduled. The patient was informed that there is a possibility that the possible distortion does not persist at time of biopsy and may require short-term follow-up.   I have discussed the findings and recommendations with the patient. Results were also provided in writing at the conclusion of the visit. If applicable, a reminder letter will be sent to the patient regarding the next appointment.   BI-RADS CATEGORY  4: Suspicious.  Surg path Diagnosis Breast, left, needle core biopsy,  UOQ distortion - COMPLEX SCLEROSING LESION. SEE NOTE. 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). - NEGATIVE FOR CARCINOMA.  Obstetric History OB History  Gravida Para Term Preterm AB Living  2 2 2     2   SAB IAB Ectopic Multiple Live Births          2    # Outcome Date GA Lbr Len/2nd Weight Sex Delivery Anes PTL Lv  2 Term           1 Term              The following portions of the patient's history were reviewed and updated as appropriate: allergies, current medications, past family history, past medical history, past social history, past surgical history, and problem list.  Review of Systems Pertinent items are noted in HPI.    Objective:  BP 138/83    Pulse 86    Wt 182 lb (82.6 kg)    LMP 04/03/2012 Comment: Still has Cervix and Ovaries   BMI 29.83 kg/m  General Appearance:    Alert, cooperative, no distress, appears stated age  Head:    Normocephalic, without obvious abnormality, atraumatic  Eyes:    conjunctiva/corneas clear, EOM's intact, both eyes  Ears:    Normal external ear canals, both ears  Nose:   Nares normal, septum midline, mucosa normal, no drainage    or sinus tenderness  Throat:   Lips, mucosa, and tongue normal; teeth and gums normal  Neck:   Supple, symmetrical, trachea midline, no adenopathy;    thyroid:  no enlargement/tenderness/nodules  Back:  Symmetric, no curvature, ROM normal, no CVA tenderness  Lungs:     respirations unlabored  Chest Wall:    No tenderness or deformity   Heart:    Regular rate and rhythm  Breast Exam:    No tenderness, masses, or nipple abnormality; small mobile sub axillary nodule on the right side.   Abdomen:     Soft, non-tender, bowel sounds active all four quadrants,    no masses, no organomegaly  Genitalia:    Normal female without lesion, discharge or tenderness   Cuff well healed. Scarring of cuff.      Extremities:   Extremities normal, atraumatic, no cyanosis or edema  Pulses:   2+ and symmetric all extremities  Skin:   Skin color, texture, turgor normal, no rashes or lesions     Assessment:    Healthy female exam.    Plan:  Diagnoses and all orders for this visit:  Well female exam with routine gynecological exam  Mass of axillary tail of right breast -     MM Digital Diagnostic Bilat; Future  Enlarged thyroid gland -     TSH  Pt now has new primary care provider and will be seen early Mar.    F/u in 1 year or sooner prn

## 2021-12-07 LAB — TSH: TSH: 4.84 u[IU]/mL — ABNORMAL HIGH (ref 0.450–4.500)

## 2021-12-20 NOTE — Progress Notes (Signed)
______________________________________________________________________  HPI Gina Wise is a 61 y.o. female presenting to [facility] at [facility] today to establish care.   Patient Care Team: Terrilyn Saver, NP as PCP - General (Family Medicine)  Health Maintenance  Topic Date Due   Hepatitis C Screening: USPSTF Recommendation to screen - Ages 43-79 yo.  Never done   Zoster (Shingles) Vaccine (1 of 2) Never done   Mammogram  01/23/2020   Tetanus Vaccine  03/13/2020   COVID-19 Vaccine (3 - Booster for Pfizer series) 04/04/2020   Flu Shot  05/22/2021   Colon Cancer Screening  03/15/2030   HIV Screening  Completed   HPV Vaccine  Aged Out   Pap Smear  Discontinued     Concerns today:  HYPERTENSION: - Medications: HCTZ 25 mg daily - Compliance: good  - Checking BP at home: not regularly  - Denies any SOB, recurrent headaches, CP, vision changes, LE edema, dizziness, palpitations, or medication side effects. - Diet: decreased calories, low sodium  - Exercise: walking    Allergic rhinitis: Zyrtec daily, Dymista nasal spray, Saline nasal spray Allergist referral placed earlier this month by prior PCP  HYPOTHYROIDISM: Levothyroxine 125 mcg Doing well, but does have ongoing fatigue.  TSH elevated 2 weeks ago at 4.840 - no adjustments have been made    HYPERLIPIDEMIA - medications: none, lifestyle management - compliance: good - medication SEs: n/a The ASCVD Risk score (Arnett DK, et al., 2019) failed to calculate for the following reasons:   Cannot find a previous HDL lab   Cannot find a previous total cholesterol lab  LUPUS - borderline, no interventions  SJOGREN'S: - used to follow with [facility], has been stable, was just doing once a year checks, since then has had breast lesion removed, anterior/posterior prolapse, COVID, chronic c.diff (last May did FMT and it "was a miracle")  GERD: Nexium  Doing well. No complaints.  DEPRESSION  - Doing well currently, no side effects, no SI/HI  Zoloft 50 mg daily  PHQ2 = 0  Flowsheet Row Office Visit from 12/21/2021 in [place] at [facility]  PHQ-2 Total Score 0        WEIGHT LOSS Was on Saxenda for a few months last year and lost 30 pounds, but has already gained 10 pounds since being off; insurance denied last renewal and no prior auth was done; she would like to try again pending labs   FMLA - has been in place since 2016 (intermittent for chronic conditions). [facility] require update every 6 months, same paperwork each time. She is going to send [place] form to complete.    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   Allergic rhinitis 07/13/2016   Lupus (systemic lupus erythematosus) ([place]) 02/17/2016   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   Lung disease with systemic lupus erythematosus ([place]) 07/14/2015   Throat clearing 08/10/2014   Hyperlipidemia 08/10/2014   Schatzki's ring 03/09/2014   Unspecified constipation 02/23/2014  GERD (gastroesophageal reflux disease) 02/23/2014   Goiter 07/03/2013   Chronic cough 01/02/2013   Iron deficiency 03/13/2012   Dysfunctional uterine bleeding 01/02/2012   Insomnia 10/03/2011   Overweight 01/03/2011   MAJOR DPRSV DISORDER RECURRENT EPISODE MODERATE 10/12/2009   Migraine 10/04/2009   Hypothyroidism 09/09/2009   Essential hypertension, benign 09/09/2009     PHQ9 Today: Depression screen [facility] 2/9 12/21/2021 08/24/2020 07/25/2020  Decreased Interest 0 0 0  Down, Depressed, Hopeless 0 0 0  PHQ - 2 Score 0 0 0  Altered sleeping - - -  Tired, decreased energy - - -  Change in appetite - - -  Feeling bad or failure about yourself  - - -  Trouble concentrating - - -  Moving slowly or fidgety/restless - - -  Suicidal thoughts - - -  PHQ-9 Score - - -  Difficult doing work/chores - - -  Some recent data might be hidden    ______________________________________________________________________ PMH 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   Allergy    Anemia    Bruising 05/10/2015   CAP (community acquired pneumonia) 08/03/2015   Chronic bronchitis ([place])    "get it q yr; in the spring time" (02/17/2014)   COVID-19    Depression    "have taken zoloft since 1996" (02/17/2014)   Diverticulosis    Dyspnea    from scar tissue   Epistaxis 12/01/2015   Gastritis    GERD (gastroesophageal reflux disease)    Hematemesis with nausea 08/01/2016   Hiatal hernia    High cholesterol    Hypertension    Hypothyroid    Internal hemorrhoids    Migraines    "maybe a couple/yr" (02/17/2014)   Multiple environmental allergies    Overweight(278.02)    Peri-menopause    Schatzki's ring    Sjogren's disease ([place])    Systemic lupus ([place])     ROS All review of systems negative except what is listed in the HPI  PHYSICAL EXAM Physical Exam Vitals reviewed.  Constitutional:      Appearance: Normal appearance.  Cardiovascular:     Rate and Rhythm: Normal rate and regular rhythm.     Pulses: Normal pulses.     Heart sounds: Normal heart sounds.  Pulmonary:     Effort: Pulmonary effort is normal.     Breath sounds: Normal breath sounds.  Skin:    General: Skin is warm and dry.  Neurological:     General: No focal deficit present.     Mental Status: She is alert and oriented to person, place, and time. Mental status is  at baseline.  Psychiatric:        Mood and Affect: Mood normal.        Behavior: Behavior normal.        Thought Content: Thought content normal.        Judgment: Judgment normal.   ______________________________________________________________________ ASSESSMENT AND PLAN  Essential hypertension, benign Blood pressure is at goal for age and co-morbidities.  I recommend continue HCTZ 25 mg daily.  In addition they were instructed on the following: - BP goal <130/80 - monitor and log blood pressures at home - check around the same time each day in a relaxed setting - Limit salt to <2000 mg/day - Follow DASH eating plan (heart healthy diet) - limit alcohol to 2 standard drinks per day for men and 1 per day for women - avoid tobacco products - get at least 2  hours of regular aerobic exercise weekly Patient aware of signs/symptoms requiring further/urgent evaluation. Labs updated today.   Hypothyroidism TSH slightly elevated a few weeks, no symptoms other than chronic mild fatigue and weight gain. Will increase synthroid to 150 mg daily and recheck in 6 weeks.   Hyperlipidemia HLD PLAN: -Medication management: none at this time, lifestyle management only  -Repeat CMP and lipid panel today -Diet low in saturated fat -Regular exercise - at least 30 minutes, 5 times per week    Overweight Updating labs as above, then planning to restart Saxenda, prior auth will likely be required  Continue healthy diet, portion control, and regular physical activity   History of prediabetes Updating labs today   Establish care: Education provided today during visit and on AVS for patient to review at home.  Diet and Exercise recommendations provided.  Current diagnoses and recommendations discussed. HM recommendations reviewed with recommendations.   Orders Placed This Encounter  Procedures   CBC   Comprehensive metabolic panel   Lipid panel   Hemoglobin A1c        Outpatient  Encounter Medications as of 12/21/2021  Medication Sig   Azelastine-Fluticasone 137-50 MCG/ACT SUSP Place 1 spray into the nose every 12 (twelve) hours.   cetirizine (ZYRTEC) 10 MG tablet Take 10 mg by mouth daily.   esomeprazole (NEXIUM) 40 MG capsule TAKE 1 CAPSULE BY MOUTH TWICE DAILY   hydrochlorothiazide (HYDRODIURIL) 25 MG tablet TAKE 1 TABLET(25 MG) BY MOUTH DAILY   levothyroxine (SYNTHROID) 125 MCG tablet TAKE 1 TABLET(125 MCG) BY MOUTH DAILY   loratadine (CLARITIN) 10 MG tablet Take by mouth.   ondansetron (ZOFRAN) 8 MG tablet Take 1 tablet (8 mg total) by mouth every 8 (eight) hours as needed for nausea or vomiting.   sertraline (ZOLOFT) 100 MG tablet Take 0.5 tablets (50 mg total) by mouth daily. Take 1/2 (one-half) tablet by mouth once daily   sodium chloride (OCEAN) 0.65 % SOLN nasal spray Place 1 spray into both nostrils as needed for congestion.   SUMAtriptan (IMITREX) 100 MG tablet May repeat in 2 hours if headache persists(Plz sched with new provider)   [DISCONTINUED] levothyroxine (SYNTHROID) 25 MCG tablet Take 1 tablet (25 mcg total) by mouth daily. Take in addition to your 150 mg tablet to total 175 mg   levothyroxine (SYNTHROID) 25 MCG tablet Take 1 tablet (25 mcg total) by mouth daily. Take in addition to your 125 mg tablet to total 150 mg   No facility-administered encounter medications on file as of 12/21/2021.    Return in about 6 weeks (around 02/01/2022) for f/u, check thyroid .    [facility] Nails Olevia Bowens, DNP, FNP-C

## 2021-12-21 ENCOUNTER — Ambulatory Visit: Payer: 59 | Admitting: Family Medicine

## 2021-12-21 ENCOUNTER — Encounter: Payer: Self-pay | Admitting: Family Medicine

## 2021-12-21 VITALS — BP 122/77 | HR 91 | Ht 65.5 in | Wt 182.8 lb

## 2021-12-21 DIAGNOSIS — E039 Hypothyroidism, unspecified: Secondary | ICD-10-CM | POA: Diagnosis not present

## 2021-12-21 DIAGNOSIS — E663 Overweight: Secondary | ICD-10-CM | POA: Diagnosis not present

## 2021-12-21 DIAGNOSIS — E782 Mixed hyperlipidemia: Secondary | ICD-10-CM | POA: Diagnosis not present

## 2021-12-21 DIAGNOSIS — Z87898 Personal history of other specified conditions: Secondary | ICD-10-CM

## 2021-12-21 DIAGNOSIS — I10 Essential (primary) hypertension: Secondary | ICD-10-CM

## 2021-12-21 DIAGNOSIS — E785 Hyperlipidemia, unspecified: Secondary | ICD-10-CM

## 2021-12-21 MED ORDER — LEVOTHYROXINE SODIUM 25 MCG PO TABS: 25.0000 ug | ORAL_TABLET | Freq: Every day | ORAL | 3 refills | Status: DC

## 2021-12-21 NOTE — Assessment & Plan Note (Signed)
TSH slightly elevated a few weeks, no symptoms other than chronic mild fatigue and weight gain. Will increase synthroid to 150 mg daily and recheck in 6 weeks.  ?

## 2021-12-21 NOTE — Assessment & Plan Note (Signed)
Blood pressure is at goal for age and co-morbidities.  I recommend continue HCTZ 25 mg daily.  In addition they were instructed on the following: ?- BP goal <130/80 ?- monitor and log blood pressures at home ?- check around the same time each day in a relaxed setting ?- Limit salt to <2000 mg/day ?- Follow DASH eating plan (heart healthy diet) ?- limit alcohol to 2 standard drinks per day for men and 1 per day for women ?- avoid tobacco products ?- get at least 2 hours of regular aerobic exercise weekly ?Patient aware of signs/symptoms requiring further/urgent evaluation. ?Labs updated today. ? ?

## 2021-12-21 NOTE — Patient Instructions (Signed)
Thank you for choosing [facility] at [facility] for your Primary Care needs. I am excited for the opportunity to partner with you to meet your health care goals. It was a pleasure meeting you today!    Information on diet, exercise, and health maintenance recommendations are listed below. This is information to help you be sure you are on track for optimal health and monitoring.   Please look over this and let us know if you have any questions or if you have completed any of the health maintenance outside of [place] so that we can be sure your records are up to date.  ___________________________________________________________  MyChart:  For all urgent or time sensitive needs we ask that you please call the office to avoid delays. Our number is [phone]. MyChart is not constantly monitored and due to the large volume of messages a day, replies may take up to 72 business hours.  MyChart Policy: MyChart allows for you to see your visit notes, after visit summary, provider recommendations, lab and tests results, make an appointment, request refills, and contact your provider or the office for non-urgent questions or concerns. Providers are seeing patients during normal business hours and do not have built in time to review MyChart messages.  We ask that you allow a minimum of 3 business days for responses to [employer]. For this reason, please do not send urgent requests through [place]. Please call the office at [phone]. New and ongoing conditions may require a visit. We have virtual and in-person visits available for your convenience.  Complex MyChart concerns may require a visit. Your provider may request you schedule a virtual or in-person visit to ensure we are providing the best care possible. MyChart messages sent after 11:00 AM on Friday will not be received by the provider until Monday morning.    Lab and Test Results: You will receive your lab and  test results on MyChart as soon as they are completed and results have been sent by the lab or testing facility. Due to this service, you will receive your results BEFORE your provider.  I review lab and test results each morning prior to seeing patients. Some results require collaboration with other providers to ensure you are receiving the most appropriate care. For this reason, we ask that you please allow a minimum of 3-5 business days from the time that ALL results have been received for your provider to receive and review lab and test results and contact you about these.  Most lab and test result comments from the provider will be sent through [place]. Your provider may recommend changes to the plan of care, follow-up visits, repeat testing, ask questions, or request an office visit to discuss these results. You may reply directly to this message or call the office to provide information for the provider or set up an appointment. In some instances, you will be called with test results and recommendations. Please let us know if this is preferred and we will make note of this in your chart to provide this for you.    If you have not heard a response to your lab or test results in 5 business days from all results returning to [place], please call the office to let us know. We ask that you please avoid calling prior to this time unless there is an emergent concern. Due to high call volumes, this can delay the resulting process.  After Hours: For all non-emergency after hours  needs, please call the office at [phone] and select the option to reach the on-call  service. On-call services are shared between multiple [place] offices and therefore it will not be possible to speak directly with your provider. On-call providers may provide medical advice and recommendations, but are unable to provide refills for maintenance medications.  For all emergency or urgent medical needs after normal business  hours, we recommend that you seek care at the closest Urgent Care or Emergency Department to ensure appropriate treatment in a timely manner.  [facility] [place] has a 24 hour emergency room located on the ground floor for your convenience.   Urgent Concerns During the Business Day Providers are seeing patients from 8AM to [place] with a busy schedule and are most often not able to respond to non-urgent calls until the end of the day or the next business day. If you should have URGENT concerns during the day, please call and speak to the nurse or schedule a same day appointment so that we can address your concern without delay.   Thank you, again, for choosing me as your health care partner. I appreciate your trust and look forward to learning more about you.   [facility] Nails Olevia Bowens, DNP, FNP-C  ___________________________________________________________  Health Maintenance Recommendations Screening Testing Mammogram Every 1-2 years based on history and risk factors Starting at age 60 Pap Smear Ages 21-39 every 3 years Ages 67-65 every 5 years with HPV testing More frequent testing may be required based on results and history Colon Cancer Screening Every 1-10 years based on test performed, risk factors, and history Starting at age 60 Bone Density Screening Every 2-10 years based on history Starting at age 60 for women Recommendations for men differ based on medication usage, history, and risk factors AAA Screening One time ultrasound Men 25-18 years old who have ever smoked Lung Cancer Screening Low Dose Lung CT every 12 months Age 60-60 years with a 20 pack-year smoking history who still smoke or who have quit within the last 15 years  Screening Labs Routine  Labs: Complete Blood Count (CBC), Complete Metabolic Panel (CMP), Cholesterol (Lipid Panel) Every 6-12 months based on history and medications May be recommended more frequently based on current conditions or  previous results Hemoglobin A1c Lab Every 3-12 months based on history and previous results Starting at age 60 or earlier with diagnosis of diabetes, high cholesterol, BMI >26, and/or risk factors Frequent monitoring for patients with diabetes to ensure blood sugar control Thyroid Panel (TSH w/ T3 & T4) Every 6 months based on history, symptoms, and risk factors May be repeated more often if on medication HIV One time testing for all patients 59 and older May be repeated more frequently for patients with increased risk factors or exposure Hepatitis C One time testing for all patients 29 and older May be repeated more frequently for patients with increased risk factors or exposure Gonorrhea, Chlamydia Every 12 months for all sexually active persons 13-24 years Additional monitoring may be recommended for those who are considered high risk or who have symptoms PSA Men 64-33 years old with risk factors Additional screening may be recommended from age 65-69 based on risk factors, symptoms, and history  Vaccine Recommendations Tetanus Booster All adults every 10 years Flu Vaccine All patients 6 months and older every year COVID Vaccine All patients 12 years and older Initial dosing with booster May recommend additional booster based on age and health history HPV Vaccine 2 doses all  patients age 31-26 Dosing may be considered for patients over 26 Shingles Vaccine (Shingrix) 2 doses all adults 90 years and older Pneumonia (Pneumovax 41) All adults 45 years and older May recommend earlier dosing based on health history Pneumonia (Prevnar 21) All adults 39 years and older Dosed 1 year after Pneumovax 23 Pneumonia (Prevnar 13) All adults 40 years and older (adults 09-38 with certain conditions or risk factors) 1 dose  For those who have no received Prevnar 13 vaccine previously   Additional Screening, Testing, and Vaccinations may be recommended on an individualized basis based on  family history, health history, risk factors, and/or exposure.  __________________________________________________________  Diet Recommendations for All Patients  I recommend that all patients maintain a diet low in saturated fats, carbohydrates, and cholesterol. While this can be challenging at first, it is not impossible and small changes can make big differences.  Things to try: Decreasing the amount of soda, sweet tea, and/or juice to one or less per day and replace with water While water is always the first choice, if you do not like water you may consider adding a water additive without sugar to improve the taste other sugar free drinks Replace potatoes with a brightly colored vegetable at dinner Use healthy oils, such as canola oil or olive oil, instead of butter or hard margarine Limit your bread intake to two pieces or less a day Replace regular pasta with low carb pasta options Bake, broil, or grill foods instead of frying Monitor portion sizes  Eat smaller, more frequent meals throughout the day instead of large meals  An important thing to remember is, if you love foods that are not great for your health, you don't have to give them up completely. Instead, allow these foods to be a reward when you have done well. Allowing yourself to still have special treats every once in a while is a nice way to tell yourself thank you for working hard to keep yourself healthy.   Also remember that every day is a new day. If you have a bad day and "fall off the wagon", you can still climb right back up and keep moving along on your journey!  We have resources available to help you!  Some websites that may be helpful include: www.http://carter.biz/  Www.VeryWellFit.com _____________________________________________________________  Activity Recommendations for All Patients  I recommend that all adults get at least 20 minutes of moderate physical activity that elevates your heart rate at least 5  days out of the week.  Some examples include: Walking or jogging at a pace that allows you to carry on a conversation Cycling (stationary bike or outdoors) Water aerobics Yoga Weight lifting Dancing If physical limitations prevent you from putting stress on your joints, exercise in a pool or seated in a chair are excellent options.  Do determine your MAXIMUM heart rate for activity: YOUR AGE - 220 = MAX HeartRate   Remember! Do not push yourself too hard.  Start slowly and build up your pace, speed, weight, time in exercise, etc.  Allow your body to rest between exercise and get good sleep. You will need more water than normal when you are exerting yourself. Do not wait until you are thirsty to drink. Drink with a purpose of getting in at least 8, 8 ounce glasses of water a day plus more depending on how much you exercise and sweat.    If you begin to develop dizziness, chest pain, abdominal pain, jaw pain, shortness of breath, headache,  vision changes, lightheadedness, or other concerning symptoms, stop the activity and allow your body to rest. If your symptoms are severe, seek emergency evaluation immediately. If your symptoms are concerning, but not severe, please let us know so that we can recommend further evaluation.

## 2021-12-21 NOTE — Assessment & Plan Note (Signed)
HLD PLAN: ?-Medication management: none at this time, lifestyle management only  ?-Repeat CMP and lipid panel today ?-Diet low in saturated fat ?-Regular exercise - at least 30 minutes, 5 times per week ? ? ?

## 2021-12-21 NOTE — Assessment & Plan Note (Signed)
Updating labs as above, then planning to restart Saxenda, prior Josem Kaufmann will likely be required  ?Continue healthy diet, portion control, and regular physical activity  ?

## 2021-12-21 NOTE — Assessment & Plan Note (Signed)
Updating labs today. 

## 2021-12-22 LAB — COMPREHENSIVE METABOLIC PANEL
ALT: 23 IU/L (ref 0–32)
AST: 25 IU/L (ref 0–40)
Albumin/Globulin Ratio: 1.8 (ref 1.2–2.2)
Albumin: 4.9 g/dL (ref 3.8–4.9)
Alkaline Phosphatase: 84 IU/L (ref 44–121)
BUN/Creatinine Ratio: 14 (ref 9–23)
BUN: 11 mg/dL (ref 6–24)
Bilirubin Total: 0.4 mg/dL (ref 0.0–1.2)
CO2: 24 mmol/L (ref 20–29)
Calcium: 9.5 mg/dL (ref 8.7–10.2)
Chloride: 99 mmol/L (ref 96–106)
Creatinine, Ser: 0.78 mg/dL (ref 0.57–1.00)
Globulin, Total: 2.7 g/dL (ref 1.5–4.5)
Glucose: 81 mg/dL (ref 70–99)
Potassium: 3.5 mmol/L (ref 3.5–5.2)
Sodium: 138 mmol/L (ref 134–144)
Total Protein: 7.6 g/dL (ref 6.0–8.5)
eGFR: 87 mL/min/{1.73_m2} (ref >59)

## 2021-12-22 LAB — LIPID PANEL
Chol/HDL Ratio: 6.2 ratio — ABNORMAL HIGH (ref 0.0–4.4)
Cholesterol, Total: 299 mg/dL — ABNORMAL HIGH (ref 100–199)
HDL: 48 mg/dL (ref >39)
LDL Chol Calc (NIH): 205 mg/dL — ABNORMAL HIGH (ref 0–99)
Triglycerides: 236 mg/dL — ABNORMAL HIGH (ref 0–149)
VLDL Cholesterol Cal: 46 mg/dL — ABNORMAL HIGH (ref 5–40)

## 2021-12-22 LAB — CBC
Hematocrit: 40.2 % (ref 34.0–46.6)
Hemoglobin: 13.9 g/dL (ref 11.1–15.9)
MCH: 29.6 pg (ref 26.6–33.0)
MCHC: 34.6 g/dL (ref 31.5–35.7)
MCV: 86 fL (ref 79–97)
Platelets: 307 10*3/uL (ref 150–450)
RBC: 4.7 x10E6/uL (ref 3.77–5.28)
RDW: 13.2 % (ref 11.7–15.4)
WBC: 7.5 10*3/uL (ref 3.4–10.8)

## 2021-12-22 LAB — HEMOGLOBIN A1C
Est. average glucose Bld gHb Est-mCnc: 108 mg/dL
Hgb A1c MFr Bld: 5.4 % (ref 4.8–5.6)

## 2021-12-25 ENCOUNTER — Telehealth: Payer: Self-pay

## 2021-12-25 ENCOUNTER — Encounter: Payer: Self-pay | Admitting: Family Medicine

## 2021-12-25 MED ORDER — SAXENDA 18 MG/3ML ~~LOC~~ SOPN: 0.6000 mg | PEN_INJECTOR | Freq: Every day | SUBCUTANEOUS | 1 refills | Status: DC

## 2021-12-25 NOTE — Telephone Encounter (Signed)
BMI is 29.96- must be 30 or higher to be eligible for Saxenda coverage. PA not sent.  ?

## 2021-12-25 NOTE — Addendum Note (Signed)
Addended by: Caleen Jobs B on: 12/25/2021 01:12 PM ? ? Modules accepted: Orders ? ?

## 2021-12-25 NOTE — Progress Notes (Signed)
A1c is no longer in the prediabetic range ?Metabolic panel and blood counts are normal.  ?Cholesterol is quite high. I can't remember if you were fasting that day - if not, we can always repeat a fasting level to see if better (let us know). Otherwise we can give it 2-3 months of trying the lifestyle modifications listed below and then rechecking.  ? ?Lifestyle factors for lowering cholesterol include: ?Diet therapy - heart-healthy diet rich in fruits, veggies, fiber-rich whole grains, lean meats, chicken, fish (at least twice a week), fat-free or 1% dairy products; foods low in saturated/trans fats, cholesterol, sodium, and sugar. Mediterranean diet has shown to be very heart healthy. ?Regular exercise - recommend at least 30 minutes a day, 5 times per week ?Weight management - sending in Saxenda, will likely need prior auth.  ? ? ?The 10-year ASCVD risk score (Arnett DK, et al., 2019) is: 5.6% ?  Values used to calculate the score: ?    Age: 61 years ?    Sex: Female ?    Is Non-Hispanic African American: No ?    Diabetic: No ?    Tobacco smoker: No ?    Systolic Blood Pressure: 638 mmHg ?    Is BP treated: Yes ?    HDL Cholesterol: 48 mg/dL ?    Total Cholesterol: 299 mg/dL ?

## 2021-12-26 NOTE — Telephone Encounter (Signed)
PA initiated via Covermymeds; KEY: [id number]. Awaiting determination.  ?

## 2021-12-26 NOTE — Telephone Encounter (Signed)
Per the plan on covermymeds; Adult patients with an initial body mass index (BMI) of ?30 kg/m2 or greater (obese), however I can attempt it.  ?

## 2021-12-27 NOTE — Telephone Encounter (Signed)
PA approved.  ? ?Request Reference Number: [id number]. SAXENDA INJ '18MG'$ /3ML is approved through 06/28/2022. Your patient may now fill this prescription and it will be covered. ?

## 2021-12-28 ENCOUNTER — Telehealth: Payer: Self-pay

## 2021-12-28 NOTE — Telephone Encounter (Signed)
Left message for patient. Esco Joslyn RN 

## 2022-01-01 ENCOUNTER — Encounter: Payer: Self-pay | Admitting: Family Medicine

## 2022-01-01 DIAGNOSIS — E663 Overweight: Secondary | ICD-10-CM

## 2022-01-02 ENCOUNTER — Ambulatory Visit
Admission: RE | Admit: 2022-01-02 | Discharge: 2022-01-02 | Disposition: A | Discharge status: Home / Self Care | Payer: 59 | Source: Ambulatory Visit | Attending: Obstetrics & Gynecology | Admitting: Obstetrics & Gynecology

## 2022-01-02 DIAGNOSIS — N6331 Unspecified lump in axillary tail of the right breast: Secondary | ICD-10-CM

## 2022-01-02 MED ORDER — SURE COMFORT PEN NEEDLES 32G X 6 MM MISC: 1.0000 | 1 refills | Status: DC | PRN

## 2022-01-03 ENCOUNTER — Encounter: Payer: Self-pay | Admitting: Family Medicine

## 2022-01-08 ENCOUNTER — Encounter: Payer: Self-pay | Admitting: Family Medicine

## 2022-02-01 ENCOUNTER — Ambulatory Visit: Payer: 59 | Admitting: Family Medicine

## 2022-02-01 ENCOUNTER — Encounter: Payer: Self-pay | Admitting: Family Medicine

## 2022-02-01 ENCOUNTER — Ambulatory Visit (INDEPENDENT_AMBULATORY_CARE_PROVIDER_SITE_OTHER): Payer: 59 | Admitting: Family Medicine

## 2022-02-01 ENCOUNTER — Encounter: Payer: 59 | Admitting: Nurse Practitioner

## 2022-02-01 VITALS — BP 143/77 | HR 85 | Temp 97.9°F | Ht 65.5 in | Wt 180.2 lb

## 2022-02-01 DIAGNOSIS — R197 Diarrhea, unspecified: Secondary | ICD-10-CM | POA: Diagnosis not present

## 2022-02-01 DIAGNOSIS — Z91041 Radiographic dye allergy status: Secondary | ICD-10-CM

## 2022-02-01 DIAGNOSIS — R1032 Left lower quadrant pain: Secondary | ICD-10-CM

## 2022-02-01 DIAGNOSIS — R112 Nausea with vomiting, unspecified: Secondary | ICD-10-CM

## 2022-02-01 DIAGNOSIS — K921 Melena: Secondary | ICD-10-CM

## 2022-02-01 MED ORDER — ONDANSETRON HCL 8 MG PO TABS: 8.0000 mg | ORAL_TABLET | Freq: Three times a day (TID) | ORAL | 2 refills | Status: DC | PRN

## 2022-02-01 MED ORDER — PREDNISONE 50 MG PO TABS: ORAL_TABLET | ORAL | 0 refills | Status: DC

## 2022-02-01 NOTE — Progress Notes (Signed)
? ?Acute Office Visit ? ?Subjective:  ? ? Patient ID: Gina Wise, female    DOB: [birth year], 61 y.o.   MRN: [id number] ? ?Chief Complaint  ?Patient presents with  ? Abdominal Pain  ? Diarrhea  ? Constipation  ? ? ? ?Patient is in today for abdominal pain, diarrhea, constipation, nausea. ? 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  Initially she was feeling wonderful but she had been made aware that IBS afterwards was quite normal but should gradually improve.  She was feeling great even at her last appointment several weeks ago.  However she states that over the past 2 or 3 weeks episodes tend to be getting more frequent and she is missing work at least 1 day a week.  She states " if is not the C. difficile since got to be something more than IBS."  Reports that she is miserable.  She will go a few days being constipated and will start to feel gurgling/churning, left lower quadrant pain and then she will eventually clear it with diarrhea for another 2 days or so.  States that during these episodes she will also have nausea and vomiting.  Reports that she will sit on the toilet and keep her back in her lap for hours.  She is very physically and mentally/emotionally tired of this.  She describes the diarrhea as yellow liquid with some yellowish-orange pieces that looks strange to her.  She says there is a terrible odor but she cannot recall if it is the same smells when she had C. difficile in the past.  She will get cold sweats, fatigue, malaise, and feel dehydrated.  States that she is not eating or drinking very much as she is afraid to worsen her stomach problems.  Reports that after a day or 2 of diarrhea I will start tapering off but at that point she will start to notice some blood in her stool.  This will end within a day and then she will feel fine for 2 to 3 days at most before she starts feeling the churning/aching, left lower quadrant pain.  Reports that  currently her symptoms are mild to moderate in her left lower quadrant pain is just an ache right now, however she is expecting it to flareup in the next few days as the last flare was on Tuesday, 2 days ago.  States when the flares occur the left lower quadrant pain is so intense that it hurts to move at times.  Reports history of diverticula but no diverticulitis. ? ?She has not yet notified her GI doctor but states that after the C. difficile culture she will follow-up with her regular GI if negative and with the [facility] who treated the C. difficile if positive.  Of note she does mention that she has been taking Nexium daily for a while now.  On review of [facility], C. difficile associated diarrhea is listed as a serious adverse reaction. ? ? ? ? ? ?Past Medical History:  ?Diagnosis Date  ? 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 Allergy   ? Anemia   ? Bruising 05/10/2015  ? CAP (community acquired pneumonia) 08/03/2015  ? Chronic bronchitis ([place])   ? "get it q yr; in the spring time" (02/17/2014)  ? COVID-19   ? Depression   ? "have taken zoloft since 1996" (02/17/2014)  ? Diverticulosis   ? Dyspnea   ? from scar tissue  ? Epistaxis 12/01/2015  ?  Gastritis   ? GERD (gastroesophageal reflux disease)   ? Hematemesis with nausea 08/01/2016  ? Hiatal hernia   ? High cholesterol   ? Hypertension   ? Hypothyroid   ? Internal hemorrhoids   ? Migraines   ? "maybe a couple/yr" (02/17/2014)  ? Multiple environmental allergies   ? Overweight(278.02)   ? Peri-menopause   ? Schatzki's ring   ? Sjogren's disease ([place])   ? Systemic lupus ([place])   ? ? ?Past Surgical History:  ?Procedure Laterality Date  ? APPENDECTOMY  1987  ? BREAST BIOPSY Left 01/30/2018  ? BREAST EXCISIONAL BIOPSY Left 02/25/2018  ? ESOPHAGOGASTRODUODENOSCOPY N/A 02/28/2014  ? Procedure: ESOPHAGOGASTRODUODENOSCOPY (EGD);  Surgeon: Jerene Bears, MD;  Location: [facility] ENDOSCOPY;  Service: Endoscopy;  Laterality: N/A;  ? ESOPHAGOGASTRODUODENOSCOPY N/A 08/02/2016  ? Procedure:  ESOPHAGOGASTRODUODENOSCOPY (EGD);  Surgeon: Gatha Mayer, MD;  Location: [facility] ENDOSCOPY;  Service: Endoscopy;  Laterality: N/A;  ? [place]  ? 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 FOOT SURGERY Right ~1980  ? "don't know what they did; had to take something out"  ? INGUINAL HERNIA REPAIR Right ~ 2003  ? RADIOACTIVE SEED GUIDED EXCISIONAL BREAST BIOPSY Left 02/25/2018  ? Procedure: RADIOACTIVE SEED GUIDED EXCISIONAL BREAST BIOPSY;  Surgeon: Rolm Bookbinder, MD;  Location: [facility];  Service: General;  Laterality: Left;  ? SHOULDER OPEN ROTATOR CUFF REPAIR Left 1995  ? SHOULDER SURGERY Left 1997  ? "cartilage"  ? TONSILLECTOMY AND ADENOIDECTOMY  1976  ? TUBAL LIGATION  1998  ? laparoscopic  ? VAGINAL HYSTERECTOMY  04/08/2012  ? menorrhagia  ? VAGINAL PROLAPSE REPAIR  08/04/2018  ? Anterior and posterior with colp-mesh-Dr. Mikle Bosworth @ [facility]  ? ? ?Family History  ?Problem Relation Age of Onset  ? Hypertension Father   ? Vision loss Father   ? Alcohol abuse Father   ? Emphysema Father   ?     Smoker  ? Hearing loss Father   ? Early death Father   ?     Murdered  ? Hypertension Mother   ? Heart disease Mother   ? Hepatitis Mother   ?     Hep C  ? Endometriosis Mother   ? Pancreatic disease Mother   ? Depression Mother   ? Hearing loss Mother   ? Lupus Sister   ? Endometriosis Sister   ? Arthritis Sister   ? Depression Sister   ? Breast cancer Paternal Aunt   ?     10's  ? Lung cancer Paternal Uncle   ?     smoker  ? Colon cancer Maternal Grandfather   ? Uterine cancer Maternal Aunt   ?     50's  ? Emphysema Paternal Aunt   ?     Non-smoker/Died of Pneumonia  ? Lung cancer Paternal Uncle   ? Heart attack Maternal Uncle   ? Parkinson's disease Maternal Grandmother   ? Alcohol abuse Maternal Grandmother   ? Heart disease Paternal Grandmother   ?     Died during angioplasty surgery  ? Aneurysm Paternal Grandfather   ? Allergy (severe) Son   ?     ASA-Idiopathic  Thrombocytopenia  ? Early death Maternal Uncle   ?     Murdered  ? Lung disease Paternal Aunt   ?     Bronchiecstasis  ? Lung cancer Paternal Uncle   ? Early death Paternal Uncle   ?  Carbon Monoxide Poisoning/Accidental  ? Stomach cancer Neg Hx   ? Pancreatic cancer Neg Hx   ? ? ?Social History  ? ?Socioeconomic History  ? Marital status: Single  ?  Spouse name: Not on file  ? Number of children: Not on file  ? Years of education: Not on file  ? Highest education level: Not on file  ?Occupational History  ? Not on file  ?Tobacco Use  ? Smoking status: Never  ? Smokeless tobacco: Never  ?Vaping Use  ? Vaping Use: Never used  ?Substance and Sexual Activity  ? Alcohol use: Not Currently  ?  Alcohol/week: 0.0 standard drinks  ?  Comment: socially  ? Drug use: No  ? Sexual activity: Not on file  ?Other Topics Concern  ? Not on file  ?Social History Narrative  ? Works at [employer]  ? ?Social Determinants of Health  ? ?Financial Resource Strain: Not on file  ?Food Insecurity: Not on file  ?Transportation Needs: Not on file  ?Physical Activity: Not on file  ?Stress: Not on file  ?Social Connections: Not on file  ?Intimate Partner Violence: Not on file  ? ? ?Outpatient Medications Prior to Visit  ?Medication Sig Dispense Refill  ? Azelastine-Fluticasone 137-50 MCG/ACT SUSP Place 1 spray into the nose every 12 (twelve) hours. 23 g 1  ? cetirizine (ZYRTEC) 10 MG tablet Take 10 mg by mouth daily.    ? esomeprazole (NEXIUM) 40 MG capsule TAKE 1 CAPSULE BY MOUTH TWICE DAILY 180 capsule 0  ? hydrochlorothiazide (HYDRODIURIL) 25 MG tablet TAKE 1 TABLET(25 MG) BY MOUTH DAILY 90 tablet 1  ? Insulin Pen Needle (SURE COMFORT PEN NEEDLES) 32G X 6 MM MISC 1 each by Does not apply route as needed. 100 each 1  ? levothyroxine (SYNTHROID) 125 MCG tablet TAKE 1 TABLET(125 MCG) BY MOUTH DAILY 90 tablet 1  ? levothyroxine (SYNTHROID) 25 MCG tablet Take 1 tablet (25 mcg total) by mouth daily. Take in addition to your 125 mg tablet to total  150 mg 90 tablet 3  ? Liraglutide -Weight Management (SAXENDA) 18 MG/3ML SOPN Inject 0.6 mg into the skin daily. Inject 0.6 mg daily for 1 week, then can increase to 1.2 mg daily for the following week, then 1.8 mg

## 2022-02-01 NOTE — Patient Instructions (Addendum)
Prior to CT:  ?Prednisone 50 mg: one tablet at 13, 7, and 1 hour prior to test ?Benadryl 50 mg 1 hours prior to test ? ?Stool cultures and blood work ordered - we will update you with results ?Recommend you reach out to GI to let them know what is going on and get an appointment scheduled ?Consider switching to Pepcid instead of Nexium (PPIs) given adverse reaction possibility of C.diff.  ? ? ? ?

## 2022-02-02 LAB — CBC WITH DIFFERENTIAL/PLATELET
Basophils Absolute: 0.1 10*3/uL (ref 0.0–0.2)
Basos: 1 %
EOS (ABSOLUTE): 0.4 10*3/uL (ref 0.0–0.4)
Eos: 6 %
Hematocrit: 40.6 % (ref 34.0–46.6)
Hemoglobin: 13.5 g/dL (ref 11.1–15.9)
Immature Grans (Abs): 0 10*3/uL (ref 0.0–0.1)
Immature Granulocytes: 0 %
Lymphocytes Absolute: 2.1 10*3/uL (ref 0.7–3.1)
Lymphs: 30 %
MCH: 29.1 pg (ref 26.6–33.0)
MCHC: 33.3 g/dL (ref 31.5–35.7)
MCV: 88 fL (ref 79–97)
Monocytes Absolute: 0.6 10*3/uL (ref 0.1–0.9)
Monocytes: 8 %
Neutrophils Absolute: 3.9 10*3/uL (ref 1.4–7.0)
Neutrophils: 55 %
Platelets: 359 10*3/uL (ref 150–450)
RBC: 4.64 x10E6/uL (ref 3.77–5.28)
RDW: 12.7 % (ref 11.7–15.4)
WBC: 7.1 10*3/uL (ref 3.4–10.8)

## 2022-02-02 LAB — COMPREHENSIVE METABOLIC PANEL
ALT: 11 IU/L (ref 0–32)
AST: 16 IU/L (ref 0–40)
Albumin/Globulin Ratio: 1.6 (ref 1.2–2.2)
Albumin: 4.4 g/dL (ref 3.8–4.9)
Alkaline Phosphatase: 70 IU/L (ref 44–121)
BUN/Creatinine Ratio: 10 (ref 9–23)
BUN: 8 mg/dL (ref 6–24)
Bilirubin Total: <0.2 mg/dL (ref 0.0–1.2)
CO2: 24 mmol/L (ref 20–29)
Calcium: 9.6 mg/dL (ref 8.7–10.2)
Chloride: 103 mmol/L (ref 96–106)
Creatinine, Ser: 0.78 mg/dL (ref 0.57–1.00)
Globulin, Total: 2.7 g/dL (ref 1.5–4.5)
Glucose: 93 mg/dL (ref 70–99)
Potassium: 4.1 mmol/L (ref 3.5–5.2)
Sodium: 144 mmol/L (ref 134–144)
Total Protein: 7.1 g/dL (ref 6.0–8.5)
eGFR: 87 mL/min/{1.73_m2} (ref >59)

## 2022-02-06 ENCOUNTER — Telehealth: Payer: Self-pay | Admitting: Family Medicine

## 2022-02-06 ENCOUNTER — Encounter: Payer: Self-pay | Admitting: Family Medicine

## 2022-02-06 NOTE — Telephone Encounter (Signed)
Patient states she has been constipated since Friday and is wondering if she should take miralax to help her since she is getting scans of her stomach done this week. She would also like a call back to discuss the scar tissue they found inside her during a procedure. Please advise.  ?

## 2022-02-08 NOTE — Telephone Encounter (Signed)
Addressed with provider on mychart. ?

## 2022-02-09 ENCOUNTER — Ambulatory Visit (INDEPENDENT_AMBULATORY_CARE_PROVIDER_SITE_OTHER): Payer: 59

## 2022-02-09 DIAGNOSIS — K921 Melena: Secondary | ICD-10-CM | POA: Diagnosis not present

## 2022-02-09 DIAGNOSIS — R197 Diarrhea, unspecified: Secondary | ICD-10-CM | POA: Diagnosis not present

## 2022-02-09 DIAGNOSIS — R112 Nausea with vomiting, unspecified: Secondary | ICD-10-CM | POA: Diagnosis not present

## 2022-02-09 DIAGNOSIS — R1032 Left lower quadrant pain: Secondary | ICD-10-CM

## 2022-02-09 MED ORDER — IOHEXOL 300 MG/ML  SOLN
100.0000 mL | Freq: Once | INTRAMUSCULAR | Status: AC | PRN
  Administered 2022-02-09: 100 mL via INTRAVENOUS

## 2022-02-11 ENCOUNTER — Encounter: Payer: Self-pay | Admitting: Family Medicine

## 2022-02-12 ENCOUNTER — Other Ambulatory Visit: Payer: 59

## 2022-02-12 DIAGNOSIS — R1032 Left lower quadrant pain: Secondary | ICD-10-CM

## 2022-02-12 DIAGNOSIS — K921 Melena: Secondary | ICD-10-CM

## 2022-02-12 DIAGNOSIS — R197 Diarrhea, unspecified: Secondary | ICD-10-CM

## 2022-02-13 ENCOUNTER — Encounter: Payer: Self-pay | Admitting: Family Medicine

## 2022-02-13 LAB — CLOSTRIDIUM DIFFICILE BY PCR: Toxigenic C. Difficile by PCR: POSITIVE — AB

## 2022-02-14 ENCOUNTER — Encounter: Payer: Self-pay | Admitting: Family Medicine

## 2022-02-17 LAB — STOOL CULTURE: E coli, Shiga toxin Assay: NEGATIVE

## 2022-02-18 ENCOUNTER — Other Ambulatory Visit: Payer: Self-pay | Admitting: Family

## 2022-02-19 ENCOUNTER — Encounter: Payer: Self-pay | Admitting: Family Medicine

## 2022-02-19 DIAGNOSIS — K449 Diaphragmatic hernia without obstruction or gangrene: Secondary | ICD-10-CM

## 2022-02-21 ENCOUNTER — Encounter: Payer: Self-pay | Admitting: Family Medicine

## 2022-03-27 ENCOUNTER — Other Ambulatory Visit: Payer: Self-pay | Admitting: Surgery

## 2022-03-27 DIAGNOSIS — K219 Gastro-esophageal reflux disease without esophagitis: Secondary | ICD-10-CM

## 2022-04-03 ENCOUNTER — Ambulatory Visit
Admission: RE | Admit: 2022-04-03 | Discharge: 2022-04-03 | Disposition: A | Discharge status: Home / Self Care | Payer: 59 | Source: Ambulatory Visit | Attending: Surgery | Admitting: Surgery

## 2022-04-03 DIAGNOSIS — K219 Gastro-esophageal reflux disease without esophagitis: Secondary | ICD-10-CM

## 2022-04-04 ENCOUNTER — Other Ambulatory Visit: Payer: Self-pay

## 2022-04-04 ENCOUNTER — Telehealth: Payer: Self-pay | Admitting: Internal Medicine

## 2022-04-04 DIAGNOSIS — K449 Diaphragmatic hernia without obstruction or gangrene: Secondary | ICD-10-CM

## 2022-04-04 NOTE — Telephone Encounter (Signed)
Pt scheduled for EGD in the [place] with Dr. Norman Herrlich 04/25/22 at 10am. EM scheduled at [facility] 08/01/22 at 10:30am. [facility] states the EM needs to be sooner as pt needs surgery soon. They will try [facility] to see if available sooner. Dr. Norman Herrlich notified. Instructions sent to pt, amb ref in epic.

## 2022-04-04 NOTE — Telephone Encounter (Signed)
Received a call from [facility] from [facility], [phone].  She states this patient saw them for a hernia consult.  The hernia has grown and they need to do this surgery as soon as they can.  However, before they can do the surgery, she needs an EGD and manometry done asap.  Please call and advise.  Thank you.

## 2022-04-07 ENCOUNTER — Other Ambulatory Visit: Payer: Self-pay | Admitting: Internal Medicine

## 2022-04-09 ENCOUNTER — Telehealth: Payer: Self-pay | Admitting: Family Medicine

## 2022-04-09 ENCOUNTER — Other Ambulatory Visit: Payer: Self-pay

## 2022-04-09 NOTE — Telephone Encounter (Signed)
Medication:   sertraline (ZOLOFT) 100 MG tablet [[id number]]   Has the patient contacted their pharmacy? No. (If no, request that the patient contact the pharmacy for the refill.) (If yes, when and what did the pharmacy advise?)  Preferred Pharmacy (with phone number or street name):   [id number] - [place], [place] - [address] AT [address]  [address], [place] [address]  Phone:  [phone]  Fax:  [phone]   Agent: Please be advised that RX refills may take up to 3 business days. We ask that you follow-up with your pharmacy.

## 2022-04-10 ENCOUNTER — Other Ambulatory Visit: Payer: Self-pay | Admitting: *Deleted

## 2022-04-10 MED ORDER — SERTRALINE HCL 100 MG PO TABS: 50.0000 mg | ORAL_TABLET | Freq: Every day | ORAL | 3 refills | Status: DC

## 2022-04-10 NOTE — Telephone Encounter (Signed)
Refill sent to pharmacy.   

## 2022-04-25 ENCOUNTER — Ambulatory Visit (AMBULATORY_SURGERY_CENTER): Payer: 59 | Admitting: Internal Medicine

## 2022-04-25 ENCOUNTER — Encounter: Payer: Self-pay | Admitting: Internal Medicine

## 2022-04-25 ENCOUNTER — Telehealth: Payer: Self-pay

## 2022-04-25 VITALS — BP 140/73 | HR 75 | Temp 98.0°F | Resp 16 | Ht 66.0 in | Wt 179.0 lb

## 2022-04-25 DIAGNOSIS — K219 Gastro-esophageal reflux disease without esophagitis: Secondary | ICD-10-CM | POA: Diagnosis not present

## 2022-04-25 DIAGNOSIS — K31A0 Gastric intestinal metaplasia, unspecified: Secondary | ICD-10-CM

## 2022-04-25 DIAGNOSIS — R933 Abnormal findings on diagnostic imaging of other parts of digestive tract: Secondary | ICD-10-CM

## 2022-04-25 DIAGNOSIS — K449 Diaphragmatic hernia without obstruction or gangrene: Secondary | ICD-10-CM

## 2022-04-25 DIAGNOSIS — K222 Esophageal obstruction: Secondary | ICD-10-CM | POA: Diagnosis not present

## 2022-04-25 DIAGNOSIS — R1319 Other dysphagia: Secondary | ICD-10-CM

## 2022-04-25 DIAGNOSIS — K31A11 Gastric intestinal metaplasia without dysplasia, involving the antrum: Secondary | ICD-10-CM | POA: Diagnosis not present

## 2022-04-25 DIAGNOSIS — K2970 Gastritis, unspecified, without bleeding: Secondary | ICD-10-CM | POA: Diagnosis not present

## 2022-04-25 MED ORDER — SODIUM CHLORIDE 0.9 % IV SOLN: 500.0000 mL | Freq: Once | INTRAVENOUS | Status: DC

## 2022-04-25 NOTE — Patient Instructions (Signed)
YOU HAD AN ENDOSCOPIC PROCEDURE TODAY AT THE [facility]:   Refer to the procedure report that was given to you for any specific questions about what was found during the examination.  If the procedure report does not answer your questions, please call your gastroenterologist to clarify.  If you requested that your care partner not be given the details of your procedure findings, then the procedure report has been included in a sealed envelope for you to review at your convenience later.  YOU SHOULD EXPECT: Some feelings of bloating in the abdomen. Passage of more gas than usual.  Walking can help get rid of the air that was put into your GI tract during the procedure and reduce the bloating. If you had a lower endoscopy (such as a colonoscopy or flexible sigmoidoscopy) you may notice spotting of blood in your stool or on the toilet paper. If you underwent a bowel prep for your procedure, you may not have a normal bowel movement for a few days.  Please Note:  You might notice some irritation and congestion in your nose or some drainage.  This is from the oxygen used during your procedure.  There is no need for concern and it should clear up in a day or so.  SYMPTOMS TO REPORT IMMEDIATELY:  Following upper endoscopy (EGD)  Vomiting of blood or coffee ground material  New chest pain or pain under the shoulder blades  Painful or persistently difficult swallowing  New shortness of breath  Fever of 100F or higher  Black, tarry-looking stools  For urgent or emergent issues, a gastroenterologist can be reached at any hour by calling [phone]. Do not use MyChart messaging for urgent concerns.    DIET:  We do recommend a small meal at first, but then you may proceed to your regular diet.  Drink plenty of fluids but you should avoid alcoholic beverages for 24 hours.  ACTIVITY:  You should plan to take it easy for the rest of today and you should NOT DRIVE or use heavy machinery until  tomorrow (because of the sedation medicines used during the test).    FOLLOW UP: Our staff will call the number listed on your records the next business day following your procedure.  We will call around 7:15- 8:00 am to check on you and address any questions or concerns that you may have regarding the information given to you following your procedure. If we do not reach you, we will leave a message.  If you develop any symptoms (ie: fever, flu-like symptoms, shortness of breath, cough etc.) before then, please call [phone].  If you test positive for Covid 19 in the 2 weeks post procedure, please call and report this information to us.    If any biopsies were taken you will be contacted by phone or by letter within the next 1-3 weeks.  Please call us at [phone] if you have not heard about the biopsies in 3 weeks.    SIGNATURES/CONFIDENTIALITY: You and/or your care partner have signed paperwork which will be entered into your electronic medical record.  These signatures attest to the fact that that the information above on your After Visit Summary has been reviewed and is understood.  Full responsibility of the confidentiality of this discharge information lies with you and/or your care-partner.  

## 2022-04-25 NOTE — Progress Notes (Signed)
Report to PACU, RN, vss, BBS= Clear.  

## 2022-04-25 NOTE — Progress Notes (Signed)
Pt's states no medical or surgical changes since previsit or office visit. 

## 2022-04-25 NOTE — Op Note (Signed)
[place] Patient Name: Gina Wise Procedure Date: 04/25/2022 9:58 AM MRN: [id number] Endoscopist: Jerene Bears , MD Age: 61 Referring MD:  Date of Birth: 20-Feb-1961 Gender: Female Account #: [id number] Procedure:                Upper GI endoscopy Indications:              Dysphagia, Gastro-esophageal reflux disease,                            Abnormal cine-esophagram, pre-operative assessment                            before hiatal hernia repair Medicines:                Monitored Anesthesia Care Procedure:                Pre-Anesthesia Assessment:                           - Prior to the procedure, a History and Physical                            was performed, and patient medications and                            allergies were reviewed. The patient's tolerance of                            previous anesthesia was also reviewed. The risks                            and benefits of the procedure and the sedation                            options and risks were discussed with the patient.                            All questions were answered, and informed consent                            was obtained. Prior Anticoagulants: The patient has                            taken no previous anticoagulant or antiplatelet                            agents. ASA Grade Assessment: II - A patient with                            mild systemic disease. After reviewing the risks                            and benefits, the patient was deemed in  satisfactory condition to undergo the procedure.                           After obtaining informed consent, the endoscope was                            passed under direct vision. Throughout the                            procedure, the patient's blood pressure, pulse, and                            oxygen saturations were monitored continuously. The                            Endoscope was introduced through the  mouth, and                            advanced to the second part of duodenum. The upper                            GI endoscopy was accomplished without difficulty.                            The patient tolerated the procedure well. Scope In: Scope Out: Findings:                 Normal mucosa was found in the entire esophagus.                           A low-grade of narrowing Schatzki ring was found at                            the gastroesophageal junction. A TTS dilator was                            passed through the scope. Dilation with an 18-19-20                            mm balloon dilator was performed to 20 mm. The                            dilation site was examined and showed mild mucosal                            disruption.                           A 5 cm hiatal hernia was present.                           The gastroesophageal flap valve was visualized  endoscopically and classified as Hill Grade IV (no                            fold, wide open lumen, hiatal hernia present).                           Mild inflammation characterized by erythema and                            granularity was found in the gastric antrum.                            Biopsies were taken with a cold forceps for                            histology and Helicobacter pylori testing.                           The examined duodenum was normal. Complications:            No immediate complications. Estimated Blood Loss:     Estimated blood loss was minimal. Impression:               - Normal mucosa was found in the entire esophagus.                           - Low-grade of narrowing Schatzki ring. Dilated to                            20 mm with balloon.                           - 5 cm hiatal hernia.                           - Gastroesophageal flap valve classified as Hill                            Grade IV (no fold, wide open lumen, hiatal hernia                             present).                           - Gastritis. Biopsied.                           - Normal examined duodenum. Recommendation:           - Patient has a contact number available for                            emergencies. The signs and symptoms of potential                            delayed complications were discussed with the  patient. Return to normal activities tomorrow.                            Written discharge instructions were provided to the                            patient.                           - Resume previous diet.                           - Continue present medications.                           - Await pathology results.                           - Proceed with esophageal manometry and then                            followup to discuss hiatal hernia repair with Dr.                            Windle Guard. Jerene Bears, MD 04/25/2022 10:19:28 AM This report has been signed electronically.

## 2022-04-25 NOTE — Telephone Encounter (Addendum)
Secure chat message with Tory Emerald, RN. Offered to move patient's manometry appt to 05/02/22 at 2 pm or 05/23/22 at 2 pm. I called pt and discussed dates with her. She has picked 05/02/22 at 2 pm. Pt reports that she has access to her MyChart. I informed her that I will send her updated instructions via MyChart. Pt verbalized understanding and had no concerns at the end of the call.  Ambulatory referral to GI in epic.

## 2022-04-25 NOTE — Progress Notes (Signed)
GASTROENTEROLOGY PROCEDURE H&P NOTE   Primary Care Physician: Terrilyn Saver, NP    Reason for Procedure:  Regurgitation, hiatal hernia, abnormal esophagram and plans for hiatal hernia repair  Plan:    EGD with possible dilation  Patient is appropriate for endoscopic procedure(s) in the ambulatory ([place]) setting.  The nature of the procedure, as well as the risks, benefits, and alternatives were carefully and thoroughly reviewed with the patient. Ample time for discussion and questions allowed. The patient understood, was satisfied, and agreed to proceed.     HPI: Gina Wise is a 61 y.o. female who presents for EGD with possible.  Medical history as below.    No recent chest pain or shortness of breath.  No abdominal pain today.  Intermittent dysphagia to solid food and large pills.  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   Allergy    Anemia    Bruising 05/10/2015   CAP (community acquired pneumonia) 08/03/2015   Chronic bronchitis ([place])    "get it q yr; in the spring time" (02/17/2014)   COVID-19    Depression    "have taken zoloft since 1996" (02/17/2014)   Diverticulosis    Dyspnea    from scar tissue   Epistaxis 12/01/2015   Gastritis    GERD (gastroesophageal reflux disease)    Hematemesis with nausea 08/01/2016   Hiatal hernia    High cholesterol    Hypertension    Hypothyroid    Internal hemorrhoids    Migraines    "maybe a couple/yr" (02/17/2014)   Multiple environmental allergies    Overweight(278.02)    Peri-menopause    Schatzki's ring    Sjogren's disease ([place])    Systemic lupus ([place])     Past Surgical History:  Procedure Laterality Date   APPENDECTOMY  1987   BREAST BIOPSY Left 01/30/2018   BREAST EXCISIONAL BIOPSY Left 02/25/2018   ESOPHAGOGASTRODUODENOSCOPY N/A 02/28/2014   Procedure: ESOPHAGOGASTRODUODENOSCOPY (EGD);  Surgeon: Jerene Bears, MD;  Location: [facility] ENDOSCOPY;  Service: Endoscopy;  Laterality: N/A;    ESOPHAGOGASTRODUODENOSCOPY N/A 08/02/2016   Procedure: ESOPHAGOGASTRODUODENOSCOPY (EGD);  Surgeon: Gatha Mayer, MD;  Location: [facility] END[facility]COPY;  Service: Endoscopy;  Laterality: N/A;   EXPLORATORY LAPAROTOMY  1987   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;  Service: General;  Laterality: Left;   [place] Left 1997   "cartilage"   [place]   laparoscopic   VAGINAL HYSTERECTOMY  04/08/2012   menorrhagia   VAGINAL PROLAPSE REPAIR  08/04/2018   Anterior and posterior with colp-mesh-Dr. Mikle Bosworth @ [facility]    Prior to Admission medications   Medication Sig Start Date End Date Taking? Authorizing Provider  cetirizine (ZYRTEC) 10 MG tablet Take 10 mg by mouth daily.   Yes [provider]  esomeprazole (NEXIUM) 40 MG capsule TAKE 1 CAPSULE BY MOUTH TWICE DAILY 04/09/22  Yes Makell Cyr, Lajuan Lines, MD  hydrochlorothiazide (HYDRODIURIL) 25 MG tablet TAKE 1 TABLET(25 MG) BY MOUTH DAILY 02/20/22  Yes Terrilyn Saver, NP  levothyroxine (SYNTHROID) 125  MCG tablet TAKE 1 TABLET(125 MCG) BY MOUTH DAILY 02/20/22  Yes Terrilyn Saver, NP  levothyroxine (SYNTHROID) 25 MCG tablet Take 1 tablet (25 mcg total) by mouth daily. Take in addition to your 125 mg tablet to total 150 mg 12/21/21  Yes Terrilyn Saver, NP  linaclotide Rolan Lipa) 72 MCG capsule Take by mouth. 04/13/22  Yes [provider]  ondansetron (ZOFRAN) 8 MG tablet Take 1 tablet (8 mg total) by mouth every 8 (eight) hours as needed for nausea or vomiting. 02/01/22  Yes Terrilyn Saver, NP  sertraline (ZOLOFT) 100  MG tablet Take 0.5 tablets (50 mg total) by mouth daily. Take 1/2 (one-half) tablet by mouth once daily 04/10/22  Yes Terrilyn Saver, NP  Azelastine-Fluticasone 137-50 MCG/ACT SUSP Place 1 spray into the nose every 12 (twelve) hours. 11/22/21   Nche, Charlene Brooke, NP  Insulin Pen Needle (SURE COMFORT PEN NEEDLES) 32G X 6 MM MISC 1 each by Does not apply route as needed. 01/02/22   Terrilyn Saver, NP  LINZESS 72 MCG capsule Take by mouth. 02/26/22   [provider]  Liraglutide -Weight Management (SAXENDA) 18 MG/3ML SOPN Inject 0.6 mg into the skin daily. Inject 0.6 mg daily for 1 week, then can increase to 1.2 mg daily for the following week, then 1.8 mg daily 12/25/21   Terrilyn Saver, NP  loratadine (CLARITIN) 10 MG tablet Take by mouth.    [provider]  predniSONE (DELTASONE) 50 MG tablet Take one tab 13 hours before exam, 7 hours before exam, and 1 hour before exam 02/01/22   Caleen Jobs B, NP  sodium chloride (OCEAN) 0.65 % SOLN nasal spray Place 1 spray into both nostrils as needed for congestion. 11/22/21   Nche, Charlene Brooke, NP  SUMAtriptan (IMITREX) 100 MG tablet May repeat in 2 hours if headache persists(Plz sched with new provider) 12/08/20   Ronnald Nian, DO    Current Outpatient Medications  Medication Sig Dispense Refill   cetirizine (ZYRTEC) 10 MG tablet Take 10 mg by mouth daily.     esomeprazole (NEXIUM) 40 MG capsule TAKE 1 CAPSULE BY MOUTH TWICE DAILY 180 capsule 0   hydrochlorothiazide (HYDRODIURIL) 25 MG tablet TAKE 1 TABLET(25 MG) BY MOUTH DAILY 90 tablet 1   levothyroxine (SYNTHROID) 125 MCG tablet TAKE 1 TABLET(125 MCG) BY MOUTH DAILY 90 tablet 1   levothyroxine (SYNTHROID) 25 MCG tablet Take 1 tablet (25 mcg total) by mouth daily. Take in addition to your 125 mg tablet to total 150 mg 90 tablet 3   linaclotide (LINZESS) 72 MCG capsule Take by mouth.     ondansetron (ZOFRAN) 8 MG tablet Take 1 tablet (8 mg total) by mouth every 8 (eight) hours as needed  for nausea or vomiting. 60 tablet 2   sertraline (ZOLOFT) 100 MG tablet Take 0.5 tablets (50 mg total) by mouth daily. Take 1/2 (one-half) tablet by mouth once daily 45 tablet 3   Azelastine-Fluticasone 137-50 MCG/ACT SUSP Place 1 spray into the nose every 12 (twelve) hours. 23 g 1   Insulin Pen Needle (SURE COMFORT PEN NEEDLES) 32G X 6 MM MISC 1 each by Does not apply route as needed. 100 each 1   LINZESS 72 MCG capsule Take by mouth.     Liraglutide -Weight Management (SAXENDA) 18 MG/3ML SOPN Inject 0.6 mg into the skin daily. Inject 0.6 mg daily for 1 week, then can increase to 1.2 mg daily for the following week, then 1.8 mg  daily 3 mL 1   loratadine (CLARITIN) 10 MG tablet Take by mouth.     predniSONE (DELTASONE) 50 MG tablet Take one tab 13 hours before exam, 7 hours before exam, and 1 hour before exam 5 tablet 0   sodium chloride (OCEAN) 0.65 % SOLN nasal spray Place 1 spray into both nostrils as needed for congestion. 15 mL 0   SUMAtriptan (IMITREX) 100 MG tablet May repeat in 2 hours if headache persists(Plz sched with new provider) 12 tablet 2   Current Facility-Administered Medications  Medication Dose Route Frequency Provider Last Rate Last Admin   0.9 %  sodium chloride infusion  500 mL Intravenous Once Kenyia Wambolt, Lajuan Lines, MD        Allergies as of 04/25/2022 - Review Complete 04/25/2022  Allergen Reaction Noted   Contrast media [iodinated contrast media] Hives and Itching 03/06/2016   Other Hives and Swelling 03/06/2016   Codeine Nausea Only    Erythromycin Nausea And Vomiting    Iohexol Hives 02/12/2014    Family History  Problem Relation Age of Onset   Hypertension Father    Vision loss Father    Alcohol abuse Father    Emphysema Father        Smoker   Hearing loss Father    Early death Father        Murdered   Hypertension Mother    Heart disease Mother    Hepatitis Mother        Hep C   Endometriosis Mother    Pancreatic disease Mother    Depression Mother     Hearing loss Mother    Lupus Sister    Endometriosis Sister    Arthritis Sister    Depression Sister    Breast cancer Paternal Aunt        48's   Lung cancer Paternal Uncle        smoker   Colon cancer Maternal Grandfather    Uterine cancer Maternal Aunt        50's   Emphysema Paternal Aunt        Non-smoker/Died of Pneumonia   Lung cancer Paternal Uncle    Heart attack Maternal Uncle    Parkinson's disease Maternal Grandmother    Alcohol abuse Maternal Grandmother    Heart disease Paternal Grandmother        Died during angioplasty surgery   Aneurysm Paternal Grandfather    Allergy (severe) Son        ASA-Idiopathic Thrombocytopenia   Early death Maternal Uncle        Murdered   Lung disease Paternal Aunt        Bronchiecstasis   Lung cancer Paternal Uncle    Early death Paternal Uncle        Carbon Monoxide Poisoning/Accidental   Stomach cancer Neg Hx    Pancreatic cancer Neg Hx     Social History   Socioeconomic History   Marital status: Single    Spouse name: Not on file   Number of children: Not on file   Years of education: Not on file   Highest education level: Not on file  Occupational History   Not on file  Tobacco Use   Smoking status: Never   Smokeless tobacco: Never  Vaping Use   Vaping Use: Never used  Substance and Sexual Activity   Alcohol use: Not Currently    Alcohol/week: 0.0 standard drinks of alcohol    Comment: socially   Drug use: No  Sexual activity: Not on file  Other Topics Concern   Not on file  Social History Narrative   Works at [employer]: Not on file  Food Insecurity: Not on file  Transportation Needs: Not on file  Physical Activity: Not on file  Stress: Not on file  Social Connections: Not on file  Intimate Partner Violence: Not on file    Physical Exam: Vital signs in last 24 hours: '@BP'$  (!) 153/92   Pulse 84   Temp 98 F (36.7 C)   Ht '5\' 6"'$  (1.676 m)    Wt 179 lb (81.2 kg)   LMP 04/03/2012 Comment: Still has Cervix and Ovaries  SpO2 99%   BMI 28.89 kg/m  GEN: NAD EYE: Sclerae anicteric ENT: MMM CV: Non-tachycardic Pulm: CTA b/l GI: Soft, NT/ND NEURO:  Alert & Oriented x 3   Zenovia Jarred, MD [facility]  04/25/2022 10:01 AM

## 2022-04-25 NOTE — Progress Notes (Signed)
Called to room to assist during endoscopic procedure.  Patient ID and intended procedure confirmed with present staff. Received instructions for my participation in the procedure from the performing physician.  

## 2022-04-26 ENCOUNTER — Telehealth: Payer: Self-pay | Admitting: *Deleted

## 2022-04-26 NOTE — Telephone Encounter (Signed)
  Follow up Call-     04/25/2022    9:08 AM 03/15/2020    1:32 PM  Call back number  Post procedure Call Back phone  # [phone]  Permission to leave phone message Yes Yes     Patient questions:  Do you have a fever, pain , or abdominal swelling? No. Pain Score  0 *  Have you tolerated food without any problems? Yes.    Have you been able to return to your normal activities? Yes.    Do you have any questions about your discharge instructions: Diet   No. Medications  No. Follow up visit  No.  Do you have questions or concerns about your Care? No.  Actions: * If pain score is 4 or above: No action needed, pain <4.

## 2022-04-27 ENCOUNTER — Other Ambulatory Visit: Payer: Self-pay

## 2022-04-27 ENCOUNTER — Ambulatory Visit: Payer: 59 | Admitting: Gastroenterology

## 2022-05-01 ENCOUNTER — Encounter: Payer: Self-pay | Admitting: Internal Medicine

## 2022-05-02 ENCOUNTER — Encounter (HOSPITAL_COMMUNITY)
Admission: RE | Disposition: A | Discharge status: Home / Self Care | Payer: Self-pay | Source: Home / Self Care | Attending: Internal Medicine

## 2022-05-02 ENCOUNTER — Ambulatory Visit (HOSPITAL_COMMUNITY)
Admission: RE | Admit: 2022-05-02 | Discharge: 2022-05-02 | Disposition: A | Discharge status: Home / Self Care | Payer: 59 | Attending: Internal Medicine | Admitting: Internal Medicine

## 2022-05-02 DIAGNOSIS — Z01818 Encounter for other preprocedural examination: Secondary | ICD-10-CM | POA: Insufficient documentation

## 2022-05-02 DIAGNOSIS — R1310 Dysphagia, unspecified: Secondary | ICD-10-CM

## 2022-05-02 DIAGNOSIS — K219 Gastro-esophageal reflux disease without esophagitis: Secondary | ICD-10-CM

## 2022-05-02 HISTORY — PX: ESOPHAGEAL MANOMETRY: SHX5429

## 2022-05-02 SURGERY — MANOMETRY, ESOPHAGUS
Anesthesia: Monitor Anesthesia Care

## 2022-05-02 MED ORDER — LIDOCAINE VISCOUS HCL 2 % MT SOLN
OROMUCOSAL | Status: AC
  Filled 2022-05-02: qty 15

## 2022-05-02 SURGICAL SUPPLY — 2 items
FACESHIELD LNG OPTICON STERILE (SAFETY) IMPLANT
GLOVE BIO SURGEON STRL SZ8 (GLOVE) ×6 IMPLANT

## 2022-05-02 NOTE — Progress Notes (Signed)
Esophageal Manometry done per protocol. Patient coughed and spit up clear liquid during probe insertion. Second RN present to help with patient nerves. After initial coughing up clear liquid patient was able to calm down and get study done without issue. Patient tolerated well without distress or complication.

## 2022-05-03 ENCOUNTER — Encounter (HOSPITAL_COMMUNITY): Payer: Self-pay | Admitting: Internal Medicine

## 2022-05-09 ENCOUNTER — Ambulatory Visit: Payer: Self-pay | Admitting: Surgery

## 2022-05-09 NOTE — H&P (Signed)
Gina Wise [id number]   Referring Provider:  Bobbe Medico,*   Subjective   Chief Complaint: No chief complaint on file.     History of Present Illness: Follow-up to review results of preop work-up.  Symptoms are unchanged.  EGD 04/25/2022, Dr. Hilarie Fredrickson: Normal esophageal mucosa, low-grade Schatzki ring at the GE junction dilated up to 20 mm, 5 cm hiatal hernia, Hill grade 4 valve, mild gastritis, biopsies negative for H. Pylori  UGI 04/03/2022: Moderate to large size hiatal hernia, definitely enlarged in 2019; episode of spontaneous and inducible GE reflux, stable widely patent lower esophageal mucosal B ring//C screening, duodenal diverticulum.  Study notes normal esophageal motility, no mention of esophageal diverticulum or proximal esophageal abnormality.  Manometry was reportedly done last week, but I am not able to find the results.  Under the manometry in [facility] epic is just the endoscopy report at this time.  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 He has known about this for at least 10 years, based on endoscopy done by Dr. Hilarie Fredrickson.  She reports that she had biopsies positive for Barrett's at the time, but it was thought that these were likely actually gastric tissue instead of esophageal tissue.  Her symptoms were well controlled until recently; they are now only moderately controlled with PPI and she has some breakthrough symptoms.  She has acid brash and regurgitation worst when she wakes up in the morning, as well as intermittent heartburn.  Denies any issues with dysphagia after having a Schatzki's ring dilated previously.  Reports that her  autoimmune diseases are fairly stable and she is not currently on any medications for these. She has chronic constipation with intermittent diarrhea, this is a little bit better on Linzess. Previous abdominal surgery includes appendectomy in 1987 when she had exploratory surgery, right inguinal hernia repair, tubal ligation, vaginal hysterectomy and vaginal prolapse repair. Was evaluated by her primary care provider in April with complaints of intermittent constipation, left lower quadrant pain, followed by diarrhea.  These episodes are accompanied with some nausea and vomiting. She works at [employer] in [employer], she is able to do this from home.  EGD March 2019 (Dr. Hilarie Fredrickson): - A low-grade of narrowing Schatzki ring (acquired) was found at the gastroesophageal junction, at 37 cm. A TTS dilator was passed through the scope. Dilation with a 16-17-18 mm balloon dilator was performed to 18 mm. After 18 mm dilation there was minimal change in the ring, and thus cold forceps were used to disrupt the ring (tissue discarded). Findings: - A 3 cm hiatal hernia was present (37 cm to 40 cm from incisors). - The entire examined stomach was normal. - The examined duodenum was normal.  CT 02/09/22: "Moderate hiatal hernia and has significantly increased in the interim.   Bowel-gas pattern is nonobstructive. No bowel wall thickening or mass lesion seen. Mild diverticulosis of the colon."    Review of Systems: A complete review of systems was obtained from the patient.  I have reviewed this information and discussed as appropriate with the patient.  See HPI as well for other ROS.   Medical History: Past Medical History:  Diagnosis Date   Allergic state    Depression    Esophageal varices (CMS-HCC)  GERD (gastroesophageal reflux disease)    Hyperlipidemia    Hypertension    Hypothyroid    Hypothyroidism (acquired)    Hypothyroidism (acquired)    Sjogren's disease (CMS-HCC)      Patient Active Problem List  Diagnosis   Disorder of lung with Sjogren's syndrome (CMS-HCC)   Chronic cough   Primary Sjogren's syndrome (CMS-HCC)   Shortness of breath   Anterior mediastinal tumor    Past Surgical History:  Procedure Laterality Date   APPENDECTOMY  1986   LAPAROSCOPIC TUBAL LIGATION  1994   left rotator cuff surgery Left 1995   INGUINAL HERNIA REPAIR  2004   HYSTERECTOMY     shoulder surgery Left 1995, 1997   TONSILLECTOMY & ADENOIDECTOMY       Allergies  Allergen Reactions   Cat Dander Hives and Swelling   Erythromycin Other (See Comments) and Nausea And Vomiting    REACTION: Nausea Severe abdominal pain REACTION: Nausea Severe abdominal pain REACTION: Nausea Severe abdominal pain Stomach pain  REACTION: Nausea Severe abdominal pain   REACTION: Nausea Severe abdominal pain      Iodinated Contrast Media Hives and Itching   Metrizamide Hives and Itching   Other Hives and Swelling    CATS AND CAT DANDER CATS AND CAT DANDER     Iohexol Hives   Codeine Other (See Comments) and Nausea    unknwn Stomach upset  unknwn      Current Outpatient Medications on File Prior to Visit  Medication Sig Dispense Refill   acetaminophen (TYLENOL) 500 MG tablet Take 1,000 mg by mouth every 6 (six) hours as needed.   (Patient not taking: Reported on 03/23/2022)     biotin 1 mg tablet Take 1,000 mcg by mouth once daily.   (Patient not taking: Reported on 03/23/2022)     cetirizine (ZYRTEC) 10 MG tablet Take 10 mg by mouth once daily     cholecalciferol (VITAMIN D3) 1,000 unit tablet Take by mouth once daily. (Patient not taking: Reported on 03/23/2022)     diphenhydrAMINE (BENADRYL) 25 mg tablet Take by mouth as needed.     esomeprazole (NEXIUM) 20 MG DR capsule Take 20 mg by mouth 2 (two) times daily.     hydroCHLOROthiazide (HYDRODIURIL) 25 MG tablet Take 25 mg by mouth once daily.     levothyroxine (SYNTHROID, LEVOTHROID) 125 MCG tablet Take 125 mcg by  mouth once daily     sertraline (ZOLOFT) 50 MG tablet Take 50 mg by mouth once daily.     SUMAtriptan (IMITREX) 100 MG tablet Take 100 mg by mouth once as needed for Migraine. May take a second dose after 2 hours if needed. (Patient not taking: Reported on 03/23/2022)     VITAMIN B COMPLEX (B COMPLEX ORAL) Take by mouth once daily. (Patient not taking: Reported on 03/23/2022)     No current facility-administered medications on file prior to visit.    Family History  Problem Relation Age of Onset   Obesity Mother    High blood pressure (Hypertension) Mother    Hyperlipidemia (Elevated cholesterol) Mother    Pulmonary embolism Mother    Lupus Mother    High blood pressure (Hypertension) Father    Hyperlipidemia (Elevated cholesterol) Father    Coronary Artery Disease (Blocked arteries around heart) Father    Emphysema Father    Obesity Sister    High blood pressure (Hypertension) Sister    Hyperlipidemia (Elevated cholesterol) Sister    Coronary Artery Disease (Blocked arteries around heart)  Sister    Lupus Sister    Rheum arthritis Paternal Grandmother      Social History   Tobacco Use  Smoking Status Never  Smokeless Tobacco Never     Social History   Socioeconomic History   Marital status: Divorced  Tobacco Use   Smoking status: Never   Smokeless tobacco: Never  Vaping Use   Vaping Use: Never used  Substance and Sexual Activity   Alcohol use: Yes    Comment: occasional   Drug use: No  Social History Narrative   Single, 2 children, works in [employer], therapeutic, lives in a house, two pet dogs and a cockatiel bird as pets    Objective:    There were no vitals filed for this visit.   There is no height or weight on file to calculate BMI.  Alert well-appearing Unlabored respirations Benign abdominal exam  Assessment and Plan:  Diagnoses and all orders for this visit:  Gastroesophageal reflux disease, unspecified whether esophagitis present  Hiatal hernia with  gastroesophageal reflux    She has a longstanding history of a hiatal hernia with recent escalation of symptoms.  In the setting of her autoimmune disease/Sjogren's complete preoperative work-up has been undertaken with results as listed in HPI, the only's results I do not have yet are manometry but the patient reports she was told everything appeared to be functioning normally.  We will contact the gastroenterologist office to see if we can get that report.  No esophageal dysmotility noted on the upper GI, but would be nice to have confirmation from the manometry.  I do think regardless we need to proceed with repairing the hiatal hernia, and most likely she will benefit from a 270 degree/toupee fundoplication.  We again discussed the relevant anatomy and the procedure technique using a diagram to demonstrate, and went over the risks of surgery eluding bleeding, infection, pain, scarring, injury to intra-abdominal or mediastinal structures, failure to completely resolve symptoms, new onset dysphagia/gas bloat/chronic digestive issues, persistence of baseline dysphagia, persistence of constipation issues, hiatal hernia recurrence or disrupted wrap, and typical postop recovery.  Questions welcomed and answered to her satisfaction.  We will plan to proceed as soon as possible.  Teshia Mahone [name], MD

## 2022-05-09 NOTE — H&P (View-Only) (Signed)
PEARLE WANDLER [id number]   Referring Provider:  Bobbe Medico,*   Subjective   Chief Complaint: No chief complaint on file.     History of Present Illness: Follow-up to review results of preop work-up.  Symptoms are unchanged.  EGD 04/25/2022, Dr. Hilarie Fredrickson: Normal esophageal mucosa, low-grade Schatzki ring at the GE junction dilated up to 20 mm, 5 cm hiatal hernia, Hill grade 4 valve, mild gastritis, biopsies negative for H. Pylori  UGI 04/03/2022: Moderate to large size hiatal hernia, definitely enlarged in 2019; episode of spontaneous and inducible GE reflux, stable widely patent lower esophageal mucosal B ring//C screening, duodenal diverticulum.  Study notes normal esophageal motility, no mention of esophageal diverticulum or proximal esophageal abnormality.  Manometry was reportedly done last week, but I am not able to find the results.  Under the manometry in [facility] epic is just the endoscopy report at this time.  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 He has known about this for at least 10 years, based on endoscopy done by Dr. Hilarie Fredrickson.  She reports that she had biopsies positive for Barrett's at the time, but it was thought that these were likely actually gastric tissue instead of esophageal tissue.  Her symptoms were well controlled until recently; they are now only moderately controlled with PPI and she has some breakthrough symptoms.  She has acid brash and regurgitation worst when she wakes up in the morning, as well as intermittent heartburn.  Denies any issues with dysphagia after having a Schatzki's ring dilated previously.  Reports that her  autoimmune diseases are fairly stable and she is not currently on any medications for these. She has chronic constipation with intermittent diarrhea, this is a little bit better on Linzess. Previous abdominal surgery includes appendectomy in 1987 when she had exploratory surgery, right inguinal hernia repair, tubal ligation, vaginal hysterectomy and vaginal prolapse repair. Was evaluated by her primary care provider in April with complaints of intermittent constipation, left lower quadrant pain, followed by diarrhea.  These episodes are accompanied with some nausea and vomiting. She works at [employer] in [employer], she is able to do this from home.  EGD March 2019 (Dr. Hilarie Fredrickson): - A low-grade of narrowing Schatzki ring (acquired) was found at the gastroesophageal junction, at 37 cm. A TTS dilator was passed through the scope. Dilation with a 16-17-18 mm balloon dilator was performed to 18 mm. After 18 mm dilation there was minimal change in the ring, and thus cold forceps were used to disrupt the ring (tissue discarded). Findings: - A 3 cm hiatal hernia was present (37 cm to 40 cm from incisors). - The entire examined stomach was normal. - The examined duodenum was normal.  CT 02/09/22: "Moderate hiatal hernia and has significantly increased in the interim.   Bowel-gas pattern is nonobstructive. No bowel wall thickening or mass lesion seen. Mild diverticulosis of the colon."    Review of Systems: A complete review of systems was obtained from the patient.  I have reviewed this information and discussed as appropriate with the patient.  See HPI as well for other ROS.   Medical History: Past Medical History:  Diagnosis Date   Allergic state    Depression    Esophageal varices (CMS-HCC)  GERD (gastroesophageal reflux disease)    Hyperlipidemia    Hypertension    Hypothyroid    Hypothyroidism (acquired)    Hypothyroidism (acquired)    Sjogren's disease (CMS-HCC)      Patient Active Problem List  Diagnosis   Disorder of lung with Sjogren's syndrome (CMS-HCC)   Chronic cough   Primary Sjogren's syndrome (CMS-HCC)   Shortness of breath   Anterior mediastinal tumor    Past Surgical History:  Procedure Laterality Date   APPENDECTOMY  1986   LAPAROSCOPIC TUBAL LIGATION  1994   left rotator cuff surgery Left 1995   INGUINAL HERNIA REPAIR  2004   HYSTERECTOMY     shoulder surgery Left 1995, 1997   TONSILLECTOMY & ADENOIDECTOMY       Allergies  Allergen Reactions   Cat Dander Hives and Swelling   Erythromycin Other (See Comments) and Nausea And Vomiting    REACTION: Nausea Severe abdominal pain REACTION: Nausea Severe abdominal pain REACTION: Nausea Severe abdominal pain Stomach pain  REACTION: Nausea Severe abdominal pain   REACTION: Nausea Severe abdominal pain      Iodinated Contrast Media Hives and Itching   Metrizamide Hives and Itching   Other Hives and Swelling    CATS AND CAT DANDER CATS AND CAT DANDER     Iohexol Hives   Codeine Other (See Comments) and Nausea    unknwn Stomach upset  unknwn      Current Outpatient Medications on File Prior to Visit  Medication Sig Dispense Refill   acetaminophen (TYLENOL) 500 MG tablet Take 1,000 mg by mouth every 6 (six) hours as needed.   (Patient not taking: Reported on 03/23/2022)     biotin 1 mg tablet Take 1,000 mcg by mouth once daily.   (Patient not taking: Reported on 03/23/2022)     cetirizine (ZYRTEC) 10 MG tablet Take 10 mg by mouth once daily     cholecalciferol (VITAMIN D3) 1,000 unit tablet Take by mouth once daily. (Patient not taking: Reported on 03/23/2022)     diphenhydrAMINE (BENADRYL) 25 mg tablet Take by mouth as needed.     esomeprazole (NEXIUM) 20 MG DR capsule Take 20 mg by mouth 2 (two) times daily.     hydroCHLOROthiazide (HYDRODIURIL) 25 MG tablet Take 25 mg by mouth once daily.     levothyroxine (SYNTHROID, LEVOTHROID) 125 MCG tablet Take 125 mcg by  mouth once daily     sertraline (ZOLOFT) 50 MG tablet Take 50 mg by mouth once daily.     SUMAtriptan (IMITREX) 100 MG tablet Take 100 mg by mouth once as needed for Migraine. May take a second dose after 2 hours if needed. (Patient not taking: Reported on 03/23/2022)     VITAMIN B COMPLEX (B COMPLEX ORAL) Take by mouth once daily. (Patient not taking: Reported on 03/23/2022)     No current facility-administered medications on file prior to visit.    Family History  Problem Relation Age of Onset   Obesity Mother    High blood pressure (Hypertension) Mother    Hyperlipidemia (Elevated cholesterol) Mother    Pulmonary embolism Mother    Lupus Mother    High blood pressure (Hypertension) Father    Hyperlipidemia (Elevated cholesterol) Father    Coronary Artery Disease (Blocked arteries around heart) Father    Emphysema Father    Obesity Sister    High blood pressure (Hypertension) Sister    Hyperlipidemia (Elevated cholesterol) Sister    Coronary Artery Disease (Blocked arteries around heart)  Sister    Lupus Sister    Rheum arthritis Paternal Grandmother      Social History   Tobacco Use  Smoking Status Never  Smokeless Tobacco Never     Social History   Socioeconomic History   Marital status: Divorced  Tobacco Use   Smoking status: Never   Smokeless tobacco: Never  Vaping Use   Vaping Use: Never used  Substance and Sexual Activity   Alcohol use: Yes    Comment: occasional   Drug use: No  Social History Narrative   Single, 2 children, works in Radi[employer], therapeutic, lives in a house, two pet dogs and a cockatiel bird as pets    Objective:    There were no vitals filed for this visit.   There is no height or weight on file to calculate BMI.  Alert well-appearing Unlabored respirations Benign abdominal exam  Assessment and Plan:  Diagnoses and all orders for this visit:  Gastroesophageal reflux disease, unspecified whether esophagitis present  Hiatal hernia with  gastroesophageal reflux    She has a longstanding history of a hiatal hernia with recent escalation of symptoms.  In the setting of her autoimmune disease/Sjogren's complete preoperative work-up has been undertaken with results as listed in HPI, the only's results I do not have yet are manometry but the patient reports she was told everything appeared to be functioning normally.  We will contact the gastroenterologist office to see if we can get that report.  No esophageal dysmotility noted on the upper GI, but would be nice to have confirmation from the manometry.  I do think regardless we need to proceed with repairing the hiatal hernia, and most likely she will benefit from a 270 degree/toupee fundoplication.  We again discussed the relevant anatomy and the procedure technique using a diagram to demonstrate, and went over the risks of surgery eluding bleeding, infection, pain, scarring, injury to intra-abdominal or mediastinal structures, failure to completely resolve symptoms, new onset dysphagia/gas bloat/chronic digestive issues, persistence of baseline dysphagia, persistence of constipation issues, hiatal hernia recurrence or disrupted wrap, and typical postop recovery.  Questions welcomed and answered to her satisfaction.  We will plan to proceed as soon as possible.  Carliyah Cotterman [name], MD

## 2022-05-10 ENCOUNTER — Encounter: Payer: Self-pay | Admitting: Internal Medicine

## 2022-05-11 NOTE — Telephone Encounter (Signed)
Gina Wise Patient inquiring about Gina Wise results. Thanks for your help Lajuan Lines. Katerine Morua, M.D.  05/11/2022

## 2022-05-11 NOTE — Patient Instructions (Addendum)
SURGICAL WAITING ROOM VISITATION Patients having surgery or a procedure may have no more than 2 support people in the waiting area - these visitors may rotate.   Children under the age of 39 must have an adult with them who is not the patient. If the patient needs to stay at the hospital during part of their recovery, the visitor guidelines for inpatient rooms apply. Pre-op nurse will coordinate an appropriate time for 1 support person to accompany patient in pre-op.  This support person may not rotate.    Please refer to the [facility] website for the visitor guidelines for Inpatients (after your surgery is over and you are in a regular room).         Your procedure is scheduled on:  05-21-22   Report to [facility] Main Entrance    Report to admitting at 11:30 AM   Call this number if you have problems the morning of surgery [phone]   Do not eat food :After Midnight the night before surgery   After Midnight you may have the following liquids until 10:45 AM DAY OF SURGERY  Clear Liquid Diet Water Black Coffee (sugar ok, NO MILK/CREAM OR CREAMERS)  Tea (sugar ok, NO MILK/CREAM OR CREAMERS) regular and decaf                             Plain Jell-O (NO RED)                                           Fruit ices (not with fruit pulp, NO RED)                                     Popsicles (NO RED)                                                                  Juice: apple, WHITE grape, WHITE cranberry Sports drinks like Gatorade (NO RED)                      If you have questions, please contact your surgeon's office.   FOLLOW  ANY ADDITIONAL PRE OP INSTRUCTIONS YOU RECEIVED FROM YOUR SURGEON'S OFFICE!!!     Oral Hygiene is also important to reduce your risk of infection.                                    Remember - BRUSH YOUR TEETH THE MORNING OF SURGERY WITH YOUR REGULAR TOOTHPASTE   Do NOT smoke after Midnight   Take these medicines the morning of surgery with  A SIP OF WATER:  Zyrtec, Nexium, Levothyroxine, Sertraline, Ondansetron and Imitrex if needed    DO NOT TAKE ANY ORAL DIABETIC MEDICATIONS DAY OF YOUR SURGERY  Bring CPAP mask and tubing day of surgery.  You may not have any metal on your body including hair pins, jewelry, and body piercing             Do not wear make-up, lotions, powders, perfumes or deodorant  Do not wear nail polish including gel and S&S, artificial/acrylic nails, or any other type of covering on natural nails including finger and toenails. If you have artificial nails, gel coating, etc. that needs to be removed by a nail salon please have this removed prior to surgery or surgery may need to be canceled/ delayed if the surgeon/ anesthesia feels like they are unable to be safely monitored.   Do not shave  48 hours prior to surgery.     Contacts, dentures or bridgework may not be worn into surgery.   Bring small overnight bag day of surgery.  Do not bring valuables to the hospital. [place].    DO NOT [place]. PHARMACY WILL DISPENSE MEDICATIONS LISTED ON YOUR MEDICATION LIST TO YOU DURING YOUR ADMISSION [place]!  Special Instructions: Bring a copy of your healthcare power of attorney and living will documents the day of surgery if you haven't scanned them before.  Please read over the following fact sheets you were given: IF YOU HAVE QUESTIONS ABOUT YOUR PRE-OP INSTRUCTIONS PLEASE CALL [facility] - Preparing for Surgery Before surgery, you can play an important role.  Because skin is not sterile, your skin needs to be as free of germs as possible.  You can reduce the number of germs on your skin by washing with CHG (chlorahexidine gluconate) soap before surgery.  CHG is an antiseptic cleaner which kills germs and bonds with the skin to continue killing germs even after washing. Please DO  NOT use if you have an allergy to CHG or antibacterial soaps.  If your skin becomes reddened/irritated stop using the CHG and inform your nurse when you arrive at Short Stay. Do not shave (including legs and underarms) for at least 48 hours prior to the first CHG shower.  You may shave your face/neck.  Please follow these instructions carefully:  1.  Shower with CHG Soap the night before surgery and the  morning of surgery.  2.  If you choose to wash your hair, wash your hair first as usual with your normal  shampoo.  3.  After you shampoo, rinse your hair and body thoroughly to remove the shampoo.                             4.  Use CHG as you would any other liquid soap.  You can apply chg directly to the skin and wash.  Gently with a scrungie or clean washcloth.  5.  Apply the CHG Soap to your body ONLY FROM THE NECK DOWN.   Do   not use on face/ open                           Wound or open sores. Avoid contact with eyes, ears mouth and   genitals (private parts).                       Wash face,  Genitals (private parts) with your normal soap.             6.  Wash  thoroughly, paying special attention to the area where your    surgery  will be performed.  7.  Thoroughly rinse your body with warm water from the neck down.  8.  DO NOT shower/wash with your normal soap after using and rinsing off the CHG Soap.                9.  Pat yourself dry with a clean towel.            10.  Wear clean pajamas.            11.  Place clean sheets on your bed the night of your first shower and do not  sleep with pets. Day of Surgery : Do not apply any lotions/deodorants the morning of surgery.  Please wear clean clothes to the hospital/surgery center.  FAILURE TO FOLLOW THESE INSTRUCTIONS MAY RESULT IN THE CANCELLATION OF YOUR SURGERY  PATIENT SIGNATURE_________________________________  NURSE  SIGNATURE__________________________________  ________________________________________________________________________

## 2022-05-11 NOTE — Telephone Encounter (Signed)
Please let patient know that Dr. Silverio Decamp who reads the report has not yet read but I will let her know as soon as I get the results

## 2022-05-11 NOTE — Progress Notes (Addendum)
COVID Vaccine Completed:  Yes  Date of COVID positive in last 90 days:  PCP - Caleen Jobs, NP Cardiologist -  Pulmonologist - Hortencia Pilar, MD  Chest x-ray -  EKG -  Stress Test -  ECHO - greater than 2 years Epic Cardiac Cath -  Pacemaker/ICD device last checked: Spinal Cord Stimulator:  Bowel Prep -   Sleep Study -  CPAP -   Fasting Blood Sugar -  Checks Blood Sugar _____ times a day  Blood Thinner Instructions: Aspirin Instructions: Last Dose:  Activity level:  Can go up a flight of stairs and perform activities of daily living without stopping and without symptoms of chest pain or shortness of breath.  Able to exercise without symptoms  Unable to go up a flight of stairs without symptoms of     Anesthesia review:  Lung disease with lupus, ILD, Sjogren's  Patient denies shortness of breath, fever, cough and chest pain at PAT appointment  Patient verbalized understanding of instructions that were given to them at the PAT appointment. Patient was also instructed that they will need to review over the PAT instructions again at home before surgery.

## 2022-05-15 ENCOUNTER — Encounter (HOSPITAL_COMMUNITY): Payer: Self-pay

## 2022-05-15 ENCOUNTER — Other Ambulatory Visit: Payer: Self-pay

## 2022-05-15 ENCOUNTER — Encounter (HOSPITAL_COMMUNITY)
Admission: RE | Admit: 2022-05-15 | Discharge: 2022-05-15 | Disposition: A | Discharge status: Home / Self Care | Payer: 59 | Source: Ambulatory Visit | Attending: Surgery | Admitting: Surgery

## 2022-05-15 VITALS — BP 139/90 | HR 82 | Temp 98.5°F | Resp 14 | Ht 66.0 in | Wt 180.0 lb

## 2022-05-15 DIAGNOSIS — I2510 Atherosclerotic heart disease of native coronary artery without angina pectoris: Secondary | ICD-10-CM | POA: Diagnosis not present

## 2022-05-15 DIAGNOSIS — R1310 Dysphagia, unspecified: Secondary | ICD-10-CM

## 2022-05-15 DIAGNOSIS — Z01818 Encounter for other preprocedural examination: Secondary | ICD-10-CM | POA: Diagnosis present

## 2022-05-15 DIAGNOSIS — R7303 Prediabetes: Secondary | ICD-10-CM | POA: Diagnosis not present

## 2022-05-15 HISTORY — DX: Nausea with vomiting, unspecified: R11.2

## 2022-05-15 HISTORY — DX: Other specified postprocedural states: Z98.890

## 2022-05-15 HISTORY — DX: Prediabetes: R73.03

## 2022-05-15 LAB — CBC
HCT: 41.3 % (ref 36.0–46.0)
Hemoglobin: 13.9 g/dL (ref 12.0–15.0)
MCH: 29.1 pg (ref 26.0–34.0)
MCHC: 33.7 g/dL (ref 30.0–36.0)
MCV: 86.4 fL (ref 80.0–100.0)
Platelets: 296 10*3/uL (ref 150–400)
RBC: 4.78 MIL/uL (ref 3.87–5.11)
RDW: 13.2 % (ref 11.5–15.5)
WBC: 7.5 10*3/uL (ref 4.0–10.5)
nRBC: 0 % (ref 0.0–0.2)

## 2022-05-15 LAB — BASIC METABOLIC PANEL
Anion gap: 10 (ref 5–15)
BUN: 12 mg/dL (ref 6–20)
CO2: 24 mmol/L (ref 22–32)
Calcium: 9.1 mg/dL (ref 8.9–10.3)
Chloride: 104 mmol/L (ref 98–111)
Creatinine, Ser: 0.77 mg/dL (ref 0.44–1.00)
GFR, Estimated: >60 mL/min (ref >60)
Glucose, Bld: 85 mg/dL (ref 70–99)
Potassium: 3 mmol/L — ABNORMAL LOW (ref 3.5–5.1)
Sodium: 138 mmol/L (ref 135–145)

## 2022-05-15 LAB — HEMOGLOBIN A1C
Hgb A1c MFr Bld: 5.1 % (ref 4.8–5.6)
Mean Plasma Glucose: 99.67 mg/dL

## 2022-05-15 LAB — GLUCOSE, CAPILLARY: Glucose-Capillary: 99 mg/dL (ref 70–99)

## 2022-05-18 NOTE — Progress Notes (Signed)
Pt aware to arrive at [facility] admitting at 10 am on Monday 05/21/2022 for scheduled surgical procedure. Aware of no food after midnight; clear liquids from midnight till 0915 then nothing by mouth.

## 2022-05-20 NOTE — Anesthesia Preprocedure Evaluation (Signed)
Anesthesia Evaluation  Patient identified by MRN, date of birth, ID band Patient awake    Reviewed: Allergy & Precautions, NPO status , Patient's Chart, lab work & pertinent test results  History of Anesthesia Complications (+) PONV, AWARENESS UNDER ANESTHESIA and history of anesthetic complications  Airway Mallampati: II  TM Distance: >3 FB Neck ROM: Full    Dental no notable dental hx. (+) Teeth Intact, Dental Advisory Given   Pulmonary shortness of breath,    Pulmonary exam normal breath sounds clear to auscultation       Cardiovascular hypertension, Normal cardiovascular exam Rhythm:Regular Rate:Normal     Neuro/Psych  Headaches, Depression    GI/Hepatic hiatal hernia, GERD  ,Esophageal varicies   Endo/Other  Hypothyroidism   Renal/GU 7/25  K+ 3.0     Musculoskeletal   Abdominal   Peds  Hematology Lab Results      Component                Value               Date                            HGB                      13.9                05/15/2022                HCT                      41.3                05/15/2022                PLT                      296                 05/15/2022              Anesthesia Other Findings All: Lohexol, Erythromycin, Codiene  Sjorgren syndrome  Reproductive/Obstetrics                           Anesthesia Physical Anesthesia Plan  ASA: 2  Anesthesia Plan: General   Post-op Pain Management: Lidocaine infusion* and Ketamine IV*   Induction: Intravenous  PONV Risk Score and Plan: 4 or greater and Treatment may vary due to age or medical condition, Ondansetron, Midazolam, Scopolamine patch - Pre-op and Dexamethasone  Airway Management Planned: Oral ETT  Additional Equipment: None  Intra-op Plan:   Post-operative Plan: Extubation in OR  Informed Consent: I have reviewed the patients History and Physical, chart, labs and discussed the procedure  including the risks, benefits and alternatives for the proposed anesthesia with the patient or authorized representative who has indicated his/her understanding and acceptance.     Dental advisory given  Plan Discussed with:   Anesthesia Plan Comments:        Anesthesia Quick Evaluation

## 2022-05-21 ENCOUNTER — Encounter (HOSPITAL_COMMUNITY): Payer: Self-pay | Admitting: Surgery

## 2022-05-21 ENCOUNTER — Other Ambulatory Visit: Payer: Self-pay

## 2022-05-21 ENCOUNTER — Encounter (HOSPITAL_COMMUNITY)
Admission: RE | Disposition: A | Discharge status: Home / Self Care | Payer: Self-pay | Source: Home / Self Care | Attending: Surgery

## 2022-05-21 ENCOUNTER — Ambulatory Visit (HOSPITAL_COMMUNITY)
Admission: RE | Admit: 2022-05-21 | Discharge: 2022-05-22 | Disposition: A | Discharge status: Home / Self Care | Payer: 59 | Attending: Surgery | Admitting: Surgery

## 2022-05-21 ENCOUNTER — Ambulatory Visit (HOSPITAL_COMMUNITY): Payer: 59 | Admitting: Physician Assistant

## 2022-05-21 ENCOUNTER — Ambulatory Visit (HOSPITAL_BASED_OUTPATIENT_CLINIC_OR_DEPARTMENT_OTHER): Payer: 59 | Admitting: Anesthesiology

## 2022-05-21 DIAGNOSIS — M3500 Sicca syndrome, unspecified: Secondary | ICD-10-CM | POA: Diagnosis not present

## 2022-05-21 DIAGNOSIS — R7303 Prediabetes: Secondary | ICD-10-CM

## 2022-05-21 DIAGNOSIS — K219 Gastro-esophageal reflux disease without esophagitis: Secondary | ICD-10-CM | POA: Diagnosis not present

## 2022-05-21 DIAGNOSIS — E039 Hypothyroidism, unspecified: Secondary | ICD-10-CM | POA: Insufficient documentation

## 2022-05-21 DIAGNOSIS — K449 Diaphragmatic hernia without obstruction or gangrene: Secondary | ICD-10-CM | POA: Diagnosis present

## 2022-05-21 DIAGNOSIS — Z01818 Encounter for other preprocedural examination: Secondary | ICD-10-CM

## 2022-05-21 DIAGNOSIS — I2510 Atherosclerotic heart disease of native coronary artery without angina pectoris: Secondary | ICD-10-CM

## 2022-05-21 DIAGNOSIS — I10 Essential (primary) hypertension: Secondary | ICD-10-CM | POA: Diagnosis not present

## 2022-05-21 DIAGNOSIS — Z8719 Personal history of other diseases of the digestive system: Secondary | ICD-10-CM

## 2022-05-21 HISTORY — PX: XI ROBOTIC ASSISTED HIATAL HERNIA REPAIR: SHX6889

## 2022-05-21 SURGERY — REPAIR, HERNIA, HIATAL, ROBOT-ASSISTED
Anesthesia: General | Site: Abdomen

## 2022-05-21 MED ORDER — LEVOTHYROXINE SODIUM 125 MCG PO TABS
125.0000 ug | ORAL_TABLET | Freq: Every day | ORAL | Status: DC
  Administered 2022-05-22: 125 ug via ORAL
  Filled 2022-05-21: qty 1

## 2022-05-21 MED ORDER — LIDOCAINE HCL 2 % IJ SOLN
INTRAMUSCULAR | Status: AC
  Filled 2022-05-21: qty 20

## 2022-05-21 MED ORDER — EPHEDRINE 5 MG/ML INJ
INTRAVENOUS | Status: AC
  Filled 2022-05-21: qty 5

## 2022-05-21 MED ORDER — SERTRALINE HCL 50 MG PO TABS
50.0000 mg | ORAL_TABLET | Freq: Every day | ORAL | Status: DC
  Administered 2022-05-22: 50 mg via ORAL
  Filled 2022-05-21: qty 1

## 2022-05-21 MED ORDER — PHENYLEPHRINE 80 MCG/ML (10ML) SYRINGE FOR IV PUSH (FOR BLOOD PRESSURE SUPPORT)
PREFILLED_SYRINGE | INTRAVENOUS | Status: DC | PRN
  Administered 2022-05-21 (×3): 160 ug via INTRAVENOUS

## 2022-05-21 MED ORDER — ORAL CARE MOUTH RINSE
15.0000 mL | OROMUCOSAL | Status: DC | PRN
Start: 2022-05-21 — End: 2022-05-22

## 2022-05-21 MED ORDER — CEFAZOLIN SODIUM-DEXTROSE 2-4 GM/100ML-% IV SOLN
2.0000 g | INTRAVENOUS | Status: AC
  Administered 2022-05-21: 2 g via INTRAVENOUS
  Filled 2022-05-21: qty 100

## 2022-05-21 MED ORDER — SUGAMMADEX SODIUM 200 MG/2ML IV SOLN
INTRAVENOUS | Status: DC | PRN
  Administered 2022-05-21: 200 mg via INTRAVENOUS

## 2022-05-21 MED ORDER — BUPIVACAINE LIPOSOME 1.3 % IJ SUSP
INTRAMUSCULAR | Status: DC | PRN
  Administered 2022-05-21: 20 mL

## 2022-05-21 MED ORDER — SUCCINYLCHOLINE CHLORIDE 200 MG/10ML IV SOSY
PREFILLED_SYRINGE | INTRAVENOUS | Status: AC
  Filled 2022-05-21: qty 10

## 2022-05-21 MED ORDER — ONDANSETRON HCL 4 MG/2ML IJ SOLN
INTRAMUSCULAR | Status: AC
  Filled 2022-05-21: qty 2

## 2022-05-21 MED ORDER — ACETAMINOPHEN 500 MG PO TABS
1000.0000 mg | ORAL_TABLET | ORAL | Status: AC
  Administered 2022-05-21: 1000 mg via ORAL
  Filled 2022-05-21: qty 2

## 2022-05-21 MED ORDER — PANTOPRAZOLE SODIUM 40 MG IV SOLR
40.0000 mg | Freq: Every day | INTRAVENOUS | Status: DC
  Administered 2022-05-21: 40 mg via INTRAVENOUS
  Filled 2022-05-21: qty 10

## 2022-05-21 MED ORDER — DEXAMETHASONE SODIUM PHOSPHATE 10 MG/ML IJ SOLN
INTRAMUSCULAR | Status: DC | PRN
  Administered 2022-05-21: 10 mg via INTRAVENOUS

## 2022-05-21 MED ORDER — DEXAMETHASONE SODIUM PHOSPHATE 10 MG/ML IJ SOLN
INTRAMUSCULAR | Status: AC
  Filled 2022-05-21: qty 1

## 2022-05-21 MED ORDER — ACETAMINOPHEN 500 MG PO TABS
1000.0000 mg | ORAL_TABLET | Freq: Four times a day (QID) | ORAL | Status: DC
  Administered 2022-05-21 – 2022-05-22 (×3): 1000 mg via ORAL
  Filled 2022-05-21 (×3): qty 2

## 2022-05-21 MED ORDER — GABAPENTIN 300 MG PO CAPS
300.0000 mg | ORAL_CAPSULE | Freq: Two times a day (BID) | ORAL | Status: DC
  Administered 2022-05-21 – 2022-05-22 (×2): 300 mg via ORAL
  Filled 2022-05-21 (×2): qty 1

## 2022-05-21 MED ORDER — MIDAZOLAM HCL 5 MG/5ML IJ SOLN
INTRAMUSCULAR | Status: DC | PRN
  Administered 2022-05-21 (×2): 2 mg via INTRAVENOUS

## 2022-05-21 MED ORDER — EPHEDRINE SULFATE-NACL 50-0.9 MG/10ML-% IV SOSY
PREFILLED_SYRINGE | INTRAVENOUS | Status: DC | PRN
  Administered 2022-05-21: 50 mg via INTRAVENOUS
  Administered 2022-05-21: 10 mg via INTRAVENOUS

## 2022-05-21 MED ORDER — FENTANYL CITRATE (PF) 100 MCG/2ML IJ SOLN
INTRAMUSCULAR | Status: DC | PRN
  Administered 2022-05-21: 50 ug via INTRAVENOUS
  Administered 2022-05-21: 100 ug via INTRAVENOUS

## 2022-05-21 MED ORDER — FENTANYL CITRATE PF 50 MCG/ML IJ SOSY: 25.0000 ug | PREFILLED_SYRINGE | INTRAMUSCULAR | Status: DC | PRN

## 2022-05-21 MED ORDER — DOCUSATE SODIUM 100 MG PO CAPS
100.0000 mg | ORAL_CAPSULE | Freq: Two times a day (BID) | ORAL | Status: DC
  Administered 2022-05-22: 100 mg via ORAL
  Filled 2022-05-21 (×2): qty 1

## 2022-05-21 MED ORDER — DIPHENHYDRAMINE HCL 50 MG/ML IJ SOLN: 25.0000 mg | Freq: Four times a day (QID) | INTRAMUSCULAR | Status: DC | PRN

## 2022-05-21 MED ORDER — PHENYLEPHRINE HCL-NACL 20-0.9 MG/250ML-% IV SOLN
INTRAVENOUS | Status: DC | PRN
  Administered 2022-05-21: 50 ug/min via INTRAVENOUS

## 2022-05-21 MED ORDER — DIPHENHYDRAMINE HCL 25 MG PO CAPS: 25.0000 mg | ORAL_CAPSULE | Freq: Four times a day (QID) | ORAL | Status: DC | PRN

## 2022-05-21 MED ORDER — GABAPENTIN 300 MG PO CAPS
300.0000 mg | ORAL_CAPSULE | ORAL | Status: AC
  Administered 2022-05-21: 300 mg via ORAL
  Filled 2022-05-21: qty 1

## 2022-05-21 MED ORDER — LACTATED RINGERS IV SOLN: INTRAVENOUS | Status: DC

## 2022-05-21 MED ORDER — CHLORHEXIDINE GLUCONATE 4 % EX LIQD: 60.0000 mL | Freq: Once | CUTANEOUS | Status: DC

## 2022-05-21 MED ORDER — PHENYLEPHRINE 80 MCG/ML (10ML) SYRINGE FOR IV PUSH (FOR BLOOD PRESSURE SUPPORT)
PREFILLED_SYRINGE | INTRAVENOUS | Status: AC
  Filled 2022-05-21: qty 10

## 2022-05-21 MED ORDER — ALBUMIN HUMAN 5 % IV SOLN
INTRAVENOUS | Status: AC
  Filled 2022-05-21: qty 250

## 2022-05-21 MED ORDER — DEXMEDETOMIDINE (PRECEDEX) IN NS 20 MCG/5ML (4 MCG/ML) IV SYRINGE
PREFILLED_SYRINGE | INTRAVENOUS | Status: DC | PRN
  Administered 2022-05-21: 8 ug via INTRAVENOUS
  Administered 2022-05-21: 4 ug via INTRAVENOUS

## 2022-05-21 MED ORDER — LIDOCAINE HCL (PF) 2 % IJ SOLN
INTRAMUSCULAR | Status: DC | PRN
  Administered 2022-05-21: 1.5 mg/kg/h via INTRADERMAL

## 2022-05-21 MED ORDER — LACTATED RINGERS IR SOLN
Status: DC | PRN
  Administered 2022-05-21: 1000 mL

## 2022-05-21 MED ORDER — HYDRALAZINE HCL 20 MG/ML IJ SOLN: 10.0000 mg | INTRAMUSCULAR | Status: DC | PRN

## 2022-05-21 MED ORDER — LORAZEPAM 2 MG/ML IJ SOLN: 0.5000 mg | Freq: Four times a day (QID) | INTRAMUSCULAR | Status: DC | PRN

## 2022-05-21 MED ORDER — 0.9 % SODIUM CHLORIDE (POUR BTL) OPTIME
TOPICAL | Status: DC | PRN
  Administered 2022-05-21: 1000 mL

## 2022-05-21 MED ORDER — PHENYLEPHRINE HCL (PRESSORS) 10 MG/ML IV SOLN
INTRAVENOUS | Status: AC
  Filled 2022-05-21: qty 1

## 2022-05-21 MED ORDER — MIDAZOLAM HCL 2 MG/2ML IJ SOLN
INTRAMUSCULAR | Status: AC
  Filled 2022-05-21: qty 2

## 2022-05-21 MED ORDER — METOCLOPRAMIDE HCL 5 MG/ML IJ SOLN
10.0000 mg | Freq: Four times a day (QID) | INTRAMUSCULAR | Status: DC
  Administered 2022-05-21 – 2022-05-22 (×4): 10 mg via INTRAVENOUS
  Filled 2022-05-21 (×4): qty 2

## 2022-05-21 MED ORDER — ROCURONIUM BROMIDE 10 MG/ML (PF) SYRINGE
PREFILLED_SYRINGE | INTRAVENOUS | Status: DC | PRN
  Administered 2022-05-21: 10 mg via INTRAVENOUS
  Administered 2022-05-21: 60 mg via INTRAVENOUS

## 2022-05-21 MED ORDER — ONDANSETRON 4 MG PO TBDP: 4.0000 mg | ORAL_TABLET | Freq: Four times a day (QID) | ORAL | Status: DC | PRN

## 2022-05-21 MED ORDER — SCOPOLAMINE 1 MG/3DAYS TD PT72
MEDICATED_PATCH | TRANSDERMAL | Status: AC
  Filled 2022-05-21: qty 1

## 2022-05-21 MED ORDER — DEXMEDETOMIDINE HCL IN NACL 80 MCG/20ML IV SOLN
INTRAVENOUS | Status: AC
  Filled 2022-05-21: qty 20

## 2022-05-21 MED ORDER — LACTATED RINGERS IV SOLN: INTRAVENOUS | Status: DC | PRN

## 2022-05-21 MED ORDER — SCOPOLAMINE 1 MG/3DAYS TD PT72
1.0000 | MEDICATED_PATCH | TRANSDERMAL | Status: DC
  Administered 2022-05-21: 1.5 mg via TRANSDERMAL

## 2022-05-21 MED ORDER — FENTANYL CITRATE (PF) 100 MCG/2ML IJ SOLN
INTRAMUSCULAR | Status: AC
  Filled 2022-05-21: qty 2

## 2022-05-21 MED ORDER — SODIUM CHLORIDE 0.9 % IV SOLN: INTRAVENOUS | Status: DC

## 2022-05-21 MED ORDER — KETAMINE HCL 10 MG/ML IJ SOLN
INTRAMUSCULAR | Status: DC | PRN
  Administered 2022-05-21: 20 mg via INTRAVENOUS
  Administered 2022-05-21: 30 mg via INTRAVENOUS

## 2022-05-21 MED ORDER — BUPIVACAINE LIPOSOME 1.3 % IJ SUSP: 20.0000 mL | Freq: Once | INTRAMUSCULAR | Status: DC

## 2022-05-21 MED ORDER — SUCCINYLCHOLINE CHLORIDE 200 MG/10ML IV SOSY
PREFILLED_SYRINGE | INTRAVENOUS | Status: DC | PRN
  Administered 2022-05-21: 100 mg via INTRAVENOUS

## 2022-05-21 MED ORDER — LIDOCAINE HCL (CARDIAC) PF 100 MG/5ML IV SOSY
PREFILLED_SYRINGE | INTRAVENOUS | Status: DC | PRN
  Administered 2022-05-21: 100 mg via INTRAVENOUS

## 2022-05-21 MED ORDER — ORAL CARE MOUTH RINSE: 15.0000 mL | Freq: Once | OROMUCOSAL | Status: AC

## 2022-05-21 MED ORDER — ONDANSETRON HCL 4 MG/2ML IJ SOLN
INTRAMUSCULAR | Status: DC | PRN
  Administered 2022-05-21: 4 mg via INTRAVENOUS

## 2022-05-21 MED ORDER — TRAMADOL HCL 50 MG PO TABS
50.0000 mg | ORAL_TABLET | Freq: Four times a day (QID) | ORAL | Status: DC | PRN
  Administered 2022-05-22: 50 mg via ORAL
  Filled 2022-05-21: qty 1

## 2022-05-21 MED ORDER — ALBUMIN HUMAN 5 % IV SOLN: INTRAVENOUS | Status: DC | PRN

## 2022-05-21 MED ORDER — ONDANSETRON HCL 4 MG/2ML IJ SOLN: 4.0000 mg | Freq: Four times a day (QID) | INTRAMUSCULAR | Status: DC | PRN

## 2022-05-21 MED ORDER — LIDOCAINE HCL (PF) 2 % IJ SOLN
INTRAMUSCULAR | Status: AC
  Filled 2022-05-21: qty 5

## 2022-05-21 MED ORDER — KETAMINE HCL 50 MG/5ML IJ SOSY
PREFILLED_SYRINGE | INTRAMUSCULAR | Status: AC
  Filled 2022-05-21: qty 5

## 2022-05-21 MED ORDER — BUPIVACAINE-EPINEPHRINE 0.25% -1:200000 IJ SOLN
INTRAMUSCULAR | Status: DC | PRN
  Administered 2022-05-21: 20 mL

## 2022-05-21 MED ORDER — SIMETHICONE 80 MG PO CHEW: 40.0000 mg | CHEWABLE_TABLET | Freq: Four times a day (QID) | ORAL | Status: DC | PRN

## 2022-05-21 MED ORDER — BISACODYL 10 MG RE SUPP: 10.0000 mg | Freq: Every day | RECTAL | Status: DC | PRN

## 2022-05-21 MED ORDER — PROPOFOL 10 MG/ML IV BOLUS
INTRAVENOUS | Status: DC | PRN
  Administered 2022-05-21: 140 mg via INTRAVENOUS

## 2022-05-21 MED ORDER — ROCURONIUM BROMIDE 10 MG/ML (PF) SYRINGE
PREFILLED_SYRINGE | INTRAVENOUS | Status: AC
  Filled 2022-05-21: qty 10

## 2022-05-21 MED ORDER — BUPIVACAINE LIPOSOME 1.3 % IJ SUSP
INTRAMUSCULAR | Status: AC
  Filled 2022-05-21: qty 20

## 2022-05-21 MED ORDER — LEVOTHYROXINE SODIUM 25 MCG PO TABS
25.0000 ug | ORAL_TABLET | Freq: Every day | ORAL | Status: DC
  Administered 2022-05-22: 25 ug via ORAL
  Filled 2022-05-21: qty 1

## 2022-05-21 MED ORDER — CHLORHEXIDINE GLUCONATE 0.12 % MT SOLN
15.0000 mL | Freq: Once | OROMUCOSAL | Status: AC
  Administered 2022-05-21: 15 mL via OROMUCOSAL

## 2022-05-21 MED ORDER — SUMATRIPTAN SUCCINATE 50 MG PO TABS: 100.0000 mg | ORAL_TABLET | ORAL | Status: DC | PRN

## 2022-05-21 MED ORDER — METOPROLOL TARTRATE 5 MG/5ML IV SOLN: 5.0000 mg | Freq: Four times a day (QID) | INTRAVENOUS | Status: DC | PRN

## 2022-05-21 MED ORDER — METHOCARBAMOL 1000 MG/10ML IJ SOLN: 500.0000 mg | Freq: Four times a day (QID) | INTRAVENOUS | Status: DC | PRN

## 2022-05-21 MED ORDER — BUPIVACAINE-EPINEPHRINE (PF) 0.25% -1:200000 IJ SOLN
INTRAMUSCULAR | Status: AC
  Filled 2022-05-21: qty 30

## 2022-05-21 SURGICAL SUPPLY — 69 items
ADH SKN CLS APL DERMABOND .7 (GAUZE/BANDAGES/DRESSINGS) ×1
APL PRP STRL LF DISP 70% ISPRP (MISCELLANEOUS) ×1
APPLIER CLIP 5 13 M/L LIGAMAX5 (MISCELLANEOUS)
APPLIER CLIP ROT 10 11.4 M/L (STAPLE)
APR CLP MED LRG 11.4X10 (STAPLE)
APR CLP MED LRG 5 ANG JAW (MISCELLANEOUS)
BAG COUNTER SPONGE SURGICOUNT (BAG) ×2 IMPLANT
BAG SPNG CNTER NS LX DISP (BAG)
BLADE SURG SZ11 CARB STEEL (BLADE) ×3 IMPLANT
CHLORAPREP W/TINT 26 (MISCELLANEOUS) ×3 IMPLANT
CLIP APPLIE 5 13 M/L LIGAMAX5 (MISCELLANEOUS) IMPLANT
CLIP APPLIE ROT 10 11.4 M/L (STAPLE) IMPLANT
COVER SURGICAL LIGHT HANDLE (MISCELLANEOUS) ×3 IMPLANT
COVER TIP SHEARS 8 DVNC (MISCELLANEOUS) IMPLANT
COVER TIP SHEARS 8MM DA VINCI (MISCELLANEOUS) ×2
DERMABOND ADVANCED (GAUZE/BANDAGES/DRESSINGS) ×1
DERMABOND ADVANCED .7 DNX12 (GAUZE/BANDAGES/DRESSINGS) ×2 IMPLANT
DRAIN PENROSE 0.5X18 (DRAIN) IMPLANT
DRAPE ARM DVNC X/XI (DISPOSABLE) ×8 IMPLANT
DRAPE COLUMN DVNC XI (DISPOSABLE) ×2 IMPLANT
DRAPE DA VINCI XI ARM (DISPOSABLE) ×8
DRAPE DA VINCI XI COLUMN (DISPOSABLE) ×2
ELECT REM PT RETURN 15FT ADLT (MISCELLANEOUS) ×3 IMPLANT
ENDOLOOP SUT PDS II  0 18 (SUTURE)
ENDOLOOP SUT PDS II 0 18 (SUTURE) IMPLANT
GAUZE 4X4 16PLY ~~LOC~~+RFID DBL (SPONGE) ×3 IMPLANT
GLOVE BIO SURGEON STRL SZ 6 (GLOVE) ×9 IMPLANT
GLOVE INDICATOR 6.5 STRL GRN (GLOVE) ×9 IMPLANT
GLOVE SS BIOGEL STRL SZ 6 (GLOVE) ×2 IMPLANT
GLOVE SUPERSENSE BIOGEL SZ 6 (GLOVE) ×1
GOWN STRL REUS W/ TWL LRG LVL3 (GOWN DISPOSABLE) ×6 IMPLANT
GOWN STRL REUS W/ TWL XL LVL3 (GOWN DISPOSABLE) IMPLANT
GOWN STRL REUS W/TWL LRG LVL3 (GOWN DISPOSABLE) ×6
GOWN STRL REUS W/TWL XL LVL3 (GOWN DISPOSABLE)
IRRIG SUCT STRYKERFLOW 2 WTIP (MISCELLANEOUS) ×2
IRRIGATION SUCT STRKRFLW 2 WTP (MISCELLANEOUS) ×2 IMPLANT
KIT BASIN OR (CUSTOM PROCEDURE TRAY) ×3 IMPLANT
KIT TURNOVER KIT A (KITS) ×1 IMPLANT
LUBRICANT JELLY K Y 4OZ (MISCELLANEOUS) IMPLANT
MARKER SKIN DUAL TIP RULER LAB (MISCELLANEOUS) ×3 IMPLANT
NDL INSUFFLATION 14GA 120MM (NEEDLE) ×2 IMPLANT
NEEDLE HYPO 22GX1.5 SAFETY (NEEDLE) ×3 IMPLANT
NEEDLE INSUFFLATION 14GA 120MM (NEEDLE) ×2 IMPLANT
PACK CARDIOVASCULAR III (CUSTOM PROCEDURE TRAY) ×3 IMPLANT
PAD POSITIONING PINK XL (MISCELLANEOUS) ×2 IMPLANT
SCISSORS LAP 5X35 DISP (ENDOMECHANICALS) IMPLANT
SEAL CANN UNIV 5-8 DVNC XI (MISCELLANEOUS) ×8 IMPLANT
SEAL XI 5MM-8MM UNIVERSAL (MISCELLANEOUS) ×8
SEALER VESSEL DA VINCI XI (MISCELLANEOUS) ×2
SEALER VESSEL EXT DVNC XI (MISCELLANEOUS) ×2 IMPLANT
SOL ANTI FOG 6CC (MISCELLANEOUS) ×2 IMPLANT
SOLUTION ANTI FOG 6CC (MISCELLANEOUS) ×1
SOLUTION ELECTROLUBE (MISCELLANEOUS) ×3 IMPLANT
SPIKE FLUID TRANSFER (MISCELLANEOUS) ×2 IMPLANT
SUT ETHIBOND 0 36 GRN (SUTURE) ×6 IMPLANT
SUT MNCRL AB 4-0 PS2 18 (SUTURE) ×3 IMPLANT
SUT SILK 0 SH 30 (SUTURE) IMPLANT
SUT SILK 2 0 SH (SUTURE) IMPLANT
SYR 10ML ECCENTRIC (SYRINGE) IMPLANT
SYR 20ML LL LF (SYRINGE) ×3 IMPLANT
TIP INNERVISION DETACH 40FR (MISCELLANEOUS) IMPLANT
TIP INNERVISION DETACH 50FR (MISCELLANEOUS) IMPLANT
TIP INNERVISION DETACH 56FR (MISCELLANEOUS) IMPLANT
TIPS INNERVISION DETACH 40FR (MISCELLANEOUS)
TOWEL OR 17X26 10 PK STRL BLUE (TOWEL DISPOSABLE) ×3 IMPLANT
TOWEL OR NON WOVEN STRL DISP B (DISPOSABLE) ×3 IMPLANT
TRAY FOLEY MTR SLVR 16FR STAT (SET/KITS/TRAYS/PACK) IMPLANT
TROCAR ADV FIXATION 5X100MM (TROCAR) ×3 IMPLANT
TUBING INSUFFLATION 10FT LAP (TUBING) ×3 IMPLANT

## 2022-05-21 NOTE — Progress Notes (Signed)
Patient very nervous, requested 2nd IV be started when she is in the OR asleep.

## 2022-05-21 NOTE — Interval H&P Note (Signed)
History and Physical Interval Note:  05/21/2022 10:25 AM  Gina Wise  has presented today for surgery, with the diagnosis of HIATAL HERNIA WITH REFLUX.  The various methods of treatment have been discussed with the patient and family. After consideration of risks, benefits and other options for treatment, the patient has consented to  Procedure(s): XI ROBOTIC ASSISTED HIATAL HERNIA REPAIR WITH FUNDOPLICATION (N/A) as a surgical intervention.  The patient's history has been reviewed, patient examined, no change in status, stable for surgery.  I have reviewed the patient's chart and labs.  Questions were answered to the patient's satisfaction.     Vincenza Dail Rich Brave

## 2022-05-21 NOTE — Anesthesia Procedure Notes (Signed)
Procedure Name: Intubation Date/Time: 05/21/2022 11:58 AM  Performed by: Lind Covert, CRNAPre-anesthesia Checklist: Patient identified, Emergency Drugs available, Patient being monitored, Suction available and Timeout performed Patient Re-evaluated:Patient Re-evaluated prior to induction Oxygen Delivery Method: Circle system utilized Preoxygenation: Pre-oxygenation with 100% oxygen (Patient claustrophic, mask avoided until asleep, able to bite end of circuit and create seal to preoxygenate) Induction Type: IV induction, Rapid sequence and Cricoid Pressure applied Laryngoscope Size: Mac and 4 Grade View: Grade II Tube type: Oral Number of attempts: 1 Airway Equipment and Method: Stylet Placement Confirmation: ETT inserted through vocal cords under direct vision, positive ETCO2 and breath sounds checked- equal and bilateral Secured at: 21 cm Tube secured with: Tape Dental Injury: Teeth and Oropharynx as per pre-operative assessment

## 2022-05-21 NOTE — Op Note (Signed)
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  The abdomen was prepped and draped in usual sterile fashion.  Peritoneal access was gained with a left subcostal Veress needle and insufflation to 15 mmHg into the incident.  An 8 mm supraumbilical trocar was placed followed by the camera and the abdomen was surveyed.  No injury or significant abnormality was present.  Bilateral laparoscopic assisted tap blocks with Exparel mixed with quarter percent Marcaine with epinephrine were performed and the remaining trocars were placed under direct visualization along with a subxiphoid liver retractor for fixed retraction of the left lobe.  The robot was then docked and instruments all inserted under direct visualization.  Omental adhesions to the falciform were taken down with the vessel sealer.  Beginning anteriorly, the hernia sac was dissected free of the crura using the vessel sealer and blunt dissection until the hernia sac was completely reduced into the abdomen along with the proximal half of the stomach and distal esophagus without tension.  The dissection was completed circumferentially clearing the crura.  The short gastric vessels were taken down with the vessel sealer.  The esophagus was mobilized, dividing the mediastinal attachments with the vessel sealer  and blunt dissection, taking care to ensure hemostasis along the way.  The vagus nerves were identified and protected.  The dissection proceeded as cephalad as possible for maximal mobilization of the esophagus.  There was a fair amount of chronic inflammatory change with some thickening of the hernia sac and periesophageal attachments, consistent with patient's history of symptomatic reflux.  We were able to achieve 5 cm of tension-free intra-abdominal esophageal length.  The crura were reapproximated posteriorly with 4 simple interrupted 0 Ethibonds, narrowing the hiatus to approximately 2.3cm (measured by the robotic bipolar instrument).  The fundus was brought posteriorly around the distal esophagus and the shoeshine maneuver performed confirming appropriate orientation.  A 270 degree posterior wrap was completed with 3 simple interrupted 0 Ethibonds on each side.  The posterior aspect of the wrap was pexied to the right crus with an additional 0 Ethibond.  On completion the wrap and distal esophagus remain tension-free within the abdominal cavity, there is no twisting or significant angulation of the esophagus, and hemostasis is confirmed.  The instruments were removed along with a liver retractor under direct visualization.  The abdomen was then desufflated and the incisions were closed with subcuticular 4 Monocryl and Dermabond.   The patient was then awakened, extubated and taken to PACU in stable condition.    All counts were correct at the completion of the case.

## 2022-05-21 NOTE — Transfer of Care (Signed)
Immediate Anesthesia Transfer of Care Note  Patient: Gina Wise  Procedure(s) Performed: XI ROBOTIC ASSISTED HIATAL HERNIA REPAIR WITH TOUPET FUNDOPLICATION (Abdomen)  Patient Location: PACU  Anesthesia Type:General  Level of Consciousness: sedated  Airway & Oxygen Therapy: Patient Spontanous Breathing and Patient connected to face mask oxygen  Post-op Assessment: Report given to RN and Post -op Vital signs reviewed and stable  Post vital signs: Reviewed and stable  Last Vitals:  Vitals Value Taken Time  BP 109/96 05/21/22 1447  Temp    Pulse 76 05/21/22 1449  Resp 11 05/21/22 1449  SpO2 95 % 05/21/22 1449  Vitals shown include unvalidated device data.  Last Pain:  Vitals:   05/21/22 1014  TempSrc:   PainSc: 0-No pain         Complications: No notable events documented.

## 2022-05-22 ENCOUNTER — Encounter (HOSPITAL_COMMUNITY): Payer: Self-pay | Admitting: Surgery

## 2022-05-22 ENCOUNTER — Ambulatory Visit (HOSPITAL_COMMUNITY): Payer: 59

## 2022-05-22 DIAGNOSIS — K449 Diaphragmatic hernia without obstruction or gangrene: Secondary | ICD-10-CM | POA: Diagnosis not present

## 2022-05-22 LAB — MAGNESIUM: Magnesium: 2 mg/dL (ref 1.7–2.4)

## 2022-05-22 LAB — BASIC METABOLIC PANEL
Anion gap: 9 (ref 5–15)
BUN: 10 mg/dL (ref 6–20)
CO2: 24 mmol/L (ref 22–32)
Calcium: 8.4 mg/dL — ABNORMAL LOW (ref 8.9–10.3)
Chloride: 104 mmol/L (ref 98–111)
Creatinine, Ser: 0.63 mg/dL (ref 0.44–1.00)
GFR, Estimated: >60 mL/min (ref >60)
Glucose, Bld: 138 mg/dL — ABNORMAL HIGH (ref 70–99)
Potassium: 3.5 mmol/L (ref 3.5–5.1)
Sodium: 137 mmol/L (ref 135–145)

## 2022-05-22 LAB — CBC
HCT: 36.1 % (ref 36.0–46.0)
Hemoglobin: 12.3 g/dL (ref 12.0–15.0)
MCH: 29.5 pg (ref 26.0–34.0)
MCHC: 34.1 g/dL (ref 30.0–36.0)
MCV: 86.6 fL (ref 80.0–100.0)
Platelets: 245 10*3/uL (ref 150–400)
RBC: 4.17 MIL/uL (ref 3.87–5.11)
RDW: 13.2 % (ref 11.5–15.5)
WBC: 10.4 10*3/uL (ref 4.0–10.5)
nRBC: 0 % (ref 0.0–0.2)

## 2022-05-22 MED ORDER — GABAPENTIN 300 MG PO CAPS: 300.0000 mg | ORAL_CAPSULE | Freq: Two times a day (BID) | ORAL | 0 refills | Status: AC

## 2022-05-22 MED ORDER — DIPHENHYDRAMINE HCL 25 MG PO CAPS: 50.0000 mg | ORAL_CAPSULE | Freq: Once | ORAL | Status: DC

## 2022-05-22 MED ORDER — POTASSIUM CHLORIDE 10 MEQ/100ML IV SOLN
10.0000 meq | INTRAVENOUS | Status: DC
  Administered 2022-05-22 (×2): 10 meq via INTRAVENOUS
  Filled 2022-05-22 (×2): qty 100

## 2022-05-22 MED ORDER — ACETAMINOPHEN 500 MG PO TABS: 1000.0000 mg | ORAL_TABLET | Freq: Four times a day (QID) | ORAL | 0 refills | Status: DC

## 2022-05-22 MED ORDER — PREDNISONE 5 MG PO TABS: 50.0000 mg | ORAL_TABLET | Freq: Four times a day (QID) | ORAL | Status: DC

## 2022-05-22 MED ORDER — TRAMADOL HCL 50 MG PO TABS: 50.0000 mg | ORAL_TABLET | Freq: Four times a day (QID) | ORAL | 0 refills | Status: AC | PRN

## 2022-05-22 NOTE — Progress Notes (Signed)
Nurse reduced the rate of Potassium run  to 75 cc/o instead of 100 cc per hour because patient complain of IV site  pain and it was also hung it with NS at 50 cc/o to dilute it.

## 2022-05-22 NOTE — Progress Notes (Signed)
S: Uneventful night.  Tolerating liquids without any sticking or dysphagia.  Very minimal nausea.  Endorses flatus.  O: Vitals, labs, intake/output, and orders reviewed at this time.  Unremarkable.   Gen: A&Ox3, no distress  H&N: EOMI, atraumatic, neck supple Chest: unlabored respirations, RRR Abd: soft, nontender, nondistended, incision(s) c/d/i without cellulitis or hematoma.  There is faint ecchymosis at the right paramedian port site. 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  Doing well. Advance to pured diet Mobilize Attempted to replace potassium IV, but patient did not tolerate due to burning so this was discontinued after 1 bag Patient reports contrast allergy and radiology recommending premedication which will delay her upper GI until tomorrow.  We will cancel the upper GI for today and plan to discharge today, we can arrange upper GI as an outpatient if needed.   Romana Juniper, MD [facility], [place]

## 2022-05-22 NOTE — Plan of Care (Signed)

## 2022-05-22 NOTE — Discharge Instructions (Addendum)
LAPAROSCOPIC SURGERY: POST OP INSTRUCTIONS   EAT SEE BELOW  WALK Walk an hour a day (cumulative- not all at once).  Control your pain to do that.    CONTROL PAIN Control pain so that you can walk, sleep, tolerate sneezing/coughing, go up/down stairs.  HAVE A BOWEL MOVEMENT DAILY Keep your bowels regular to avoid problems.  OK to try a laxative to override constipation.  OK to use an antidairrheal to slow down diarrhea.  Call if not better after 2 tries  CALL IF YOU HAVE PROBLEMS/CONCERNS Call if you are still struggling despite following these instructions. Call if you have concerns not answered by these instructions   Take your usually prescribed home medications unless otherwise directed.  PAIN CONTROL: Pain is best controlled by a usual combination of three different methods TOGETHER: Ice/Heat Over the counter pain medication Prescription pain medication Most patients will experience some swelling and bruising around the incisions.  Ice packs or heating pads (30-60 minutes up to 6 times a day) will help. Use ice for the first few days to help decrease swelling and bruising, then switch to heat to help relax tight/sore spots and speed recovery.  Some people prefer to use ice alone, heat alone, alternating between ice & heat.  Experiment to what works for you.  Swelling and bruising can take several weeks to resolve.   It is helpful to take an over-the-counter pain medication regularly for the first few days: Naproxen (Aleve, etc)  Two '220mg'$  tabs twice a day OR Ibuprofen (Advil, etc) Three '200mg'$  tabs four times a day (every meal & bedtime) AND Acetaminophen (Tylenol, etc) 500-'650mg'$  four times a day (every meal & bedtime) A  prescription for pain medication (such as oxycodone, hydrocodone, tramadol, gabapentin, methocarbamol, etc) should be given to you upon discharge.  Take your pain medication as prescribed, IF NEEDED.  If you are having problems/concerns with the prescription  medicine (does not control pain, nausea, vomiting, rash, itching, etc), please call us [phone] to see if we need to switch you to a different pain medicine that will work better for you and/or control your side effect better. If you need a refill on your pain medication, please give [place] 48 hour notice.  contact your pharmacy.  They will contact our office to request authorization. Prescriptions will not be filled after 5 pm or on week-ends  Avoid getting constipated.   Between the surgery and the pain medications, it is common to experience some constipation.   Increasing fluid intake and taking a fiber supplement (such as Metamucil, Citrucel, FiberCon, MiraLax, etc) 1-2 times a day regularly will usually help prevent this problem from occurring.   A mild laxative (prune juice, Milk of Magnesia, MiraLax, etc) should be taken according to package directions if there are no bowel movements after 48 hours.   Watch out for diarrhea.   If you have many loose bowel movements, simplify your diet to bland foods & liquids for a few days.   Stop any stool softeners and decrease your fiber supplement.   Switching to mild anti-diarrheal medications (Kayopectate, Pepto Bismol) can help.   If this worsens or does not improve, please call us.  Wash / shower every day.  You may shower over the skin glue which is waterproof. No rubbing, scrubbing, lotions or ointments to incisions. Do not soak or submerge incisions.   Glue will flake off after about 2 weeks.  You may leave the incision open to air.  You may  replace a dressing/Band-Aid to cover the incision for comfort if you wish.   ACTIVITIES as tolerated:   You may resume regular (light) daily activities beginning the next day--such as daily self-care, walking, climbing stairs--gradually increasing activities as tolerated.  If you can walk 30 minutes without difficulty, it is safe to try more intense activity such as jogging, treadmill, bicycling,  low-impact aerobics, swimming, etc. Refrain from the most intensive and strenuous activity such as sit-ups, heavy lifting, contact sports, etc  Refrain from any heavy lifting or straining until about 8 weeks after surgery.   DO NOT PUSH THROUGH PAIN.  Let pain be your guide: If it hurts to do something, don't do it.  Pain is your body warning you to avoid that activity for another week until the pain goes down. You may drive when you are no longer taking prescription pain medication, you can comfortably wear a seatbelt, and you can safely maneuver your car and apply brakes. You may have sexual intercourse when it is comfortable.  FOLLOW UP in our office Please call [facility] at [phone] to set up an appointment to see your surgeon in the office for a follow-up appointment approximately 2-3 weeks after your surgery. Make sure that you call for this appointment the day you arrive home to insure a convenient appointment time.  10. IF YOU HAVE DISABILITY OR FAMILY LEAVE FORMS, BRING THEM TO THE OFFICE FOR PROCESSING.  DO NOT GIVE THEM TO YOUR DOCTOR.   WHEN TO CALL us [phone]: Poor pain control Reactions / problems with new medications (rash/itching, nausea, etc)  Fever over 101.5 F (38.5 C) Inability to urinate Nausea and/or vomiting Worsening swelling or bruising Continued bleeding from incision. Increased pain, redness, or drainage from the incision   The clinic staff is available to answer your questions during regular business hours (8:30am-5pm).  Please don't hesitate to call and ask to speak to one of our nurses for clinical concerns.   If you have a medical emergency, go to the nearest emergency room or call 911.  A surgeon from [facility] is always on call at the [facility], [place], [place], [place], [place]  [id number] ? MAIN: [phone] ? TOLL FREE: [phone] ?  FAX [phone]  [phone] [email or web address]  EATING AFTER YOUR HIATAL HERNIA REPAIR   After your esophageal surgery, expect some sticking with swallowing over the next 1-2 months.    If food sticks when you eat, it is called "dysphagia".  This is due to swelling around your esophagus at the wrap & hiatal diaphragm repair.  It will gradually ease off over the next few months.  To help you through this temporary phase, we start you out on a pureed (blenderized) diet.  Your first meal in the hospital was thin liquids.  You should have been given a pureed diet by the time you left the hospital.  We ask patients to stay on a pureed diet for the first 2-3 weeks to avoid anything getting "stuck" near your recent surgery.  Don't be alarmed if your ability to swallow doesn't progress according to this plan.  Everyone is different and some diets can advance more or less quickly.    It is often helpful to crush your medications or split them as they can sometimes stick, especially the first week or so.   Some BASIC RULES to follow are: Maintain an upright position whenever eating or drinking. Take small bites -  just a teaspoon size bite at a time. Eat slowly.  It may also help to eat only one food at a time. Consider nibbling through smaller, more frequent meals & avoid the urge to eat BIG meals Do not push through feelings of fullness, nausea, or bloatedness Do not mix solid foods and liquids in the same mouthful Try not to "wash foods down" with large gulps of liquids. Avoid carbonated (bubbly/fizzy) drinks.   Avoid foods that make you feel gassy or bloated.  Start with bland foods first.  Wait on trying greasy, fried, or spicy meals until you are tolerating more bland solids well. Understand that it will be hard to burp and belch at first.  This gradually improves with time.  Expect to be more gassy/flatulent/bloated initially.  Walking will help your body manage it better. Consider using medications  for bloating that contain simethicone such as  Maalox or Gas-X  Consider crushing her medications, especially smaller pills.  The ability to swallow pills should get easier after a few weeks Eat in a relaxed atmosphere & minimize distractions. Avoid talking while eating.   Do not use straws. Following each meal, sit in an upright position (90 degree angle) for 60 to 90 minutes.  Going for a short walk can help as well If food does stick, don't panic.  Try to relax and let the food pass on its own.  Sipping WARM LIQUID such as strong hot black tea can also help slide it down.   Be gradual in changes & use common sense:  -If you easily tolerating a certain "level" of foods, advance to the next level gradually -If you are having trouble swallowing a particular food, then avoid it.   -If food is sticking when you advance your diet, go back to thinner previous diet (the lower LEVEL) for 1-2 days.  LEVEL 1 = PUREED DIET  Do for the first 2 WEEKS AFTER SURGERY  -Foods in this group are pureed or blenderized to a smooth, mashed potato-like consistency.  -If necessary, the pureed foods can keep their shape with the addition of a thickening agent.   -Meat should be pureed to a smooth, pasty consistency.  Hot broth or gravy may be added to the pureed meat, approximately 1 oz. of liquid per 3 oz. serving of meat. -CAUTION:  If any foods do not puree into a smooth consistency, swallowing will be more difficult.  (For example, nuts or seeds sometimes do not blend well.)  Hot Foods Cold Foods  Pureed scrambled eggs and cheese Pureed cottage cheese  Baby cereals Thickened juices and nectars  Thinned cooked cereals (no lumps) Thickened milk or eggnog  Pureed [place] toast or pancakes Ensure  Mashed potatoes Ice cream  Pureed parsley, au gratin, scalloped potatoes, candied sweet potatoes Fruit or [place] ice, sherbet  Pureed buttered or alfredo noodles Plain yogurt  Pureed vegetables (no corn or peas)  Instant breakfast  Pureed soups and creamed soups Smooth pudding, mousse, custard  Pureed scalloped apples Whipped gelatin  Gravies Sugar, syrup, honey, jelly  Sauces, cheese, tomato, barbecue, white, creamed Cream  Any baby food Creamer  Alcohol in moderation (not beer or champagne) Margarine  Coffee or tea Mayonnaise   Ketchup, mustard   Apple sauce   SAMPLE MENU:  PUREED DIET Breakfast Lunch Dinner  Orange juice, 1/2 cup Cream of wheat, 1/2 cup Pineapple juice, 1/2 cup Pureed [place], barley soup, 3/4 cup Pureed Hawaiian chicken, 3 oz  Scrambled eggs, mashed or blended  with cheese, 1/2 cup Tea or coffee, 1 cup  Whole milk, 1 cup  Non-dairy creamer, 2 Tbsp. Mashed potatoes, 1/2 cup Pureed cooled broccoli, 1/2 cup Apple sauce, 1/2 cup Coffee or tea Mashed potatoes, 1/2 cup Pureed spinach, 1/2 cup Frozen yogurt, 1/2 cup Tea or coffee      LEVEL 2 = SOFT DIET  After your first 2 weeks, you can advance to a soft diet.   Keep on this diet until everything goes down easily.  Hot Foods Cold Foods  White fish Cottage cheese  Stuffed fish Junior baby fruit  Baby food meals Semi thickened juices  Minced soft cooked, scrambled, poached eggs nectars  Souffle & omelets Ripe mashed bananas  Cooked cereals Canned fruit, pineapple sauce, milk  potatoes Milkshake  Buttered or Alfredo noodles Custard  Cooked cooled vegetable Puddings, including tapioca  Sherbet Yogurt  Vegetable soup or alphabet soup Fruit ice, [place] ice  Gravies Whipped gelatin  Sugar, syrup, honey, jelly Junior baby desserts  Sauces:  Cheese, creamed, barbecue, tomato, white Cream  Coffee or tea Margarine   SAMPLE MENU:  LEVEL 2 Breakfast Lunch Dinner  Orange juice, 1/2 cup Oatmeal, 1/2 cup Scrambled eggs with cheese, 1/2 cup Decaffeinated tea, 1 cup Whole milk, 1 cup Non-dairy creamer, 2 Tbsp Pineapple juice, 1/2 cup Minced beef, 3 oz Gravy, 2 Tbsp Mashed potatoes, 1/2 cup Minced fresh broccoli, 1/2  cup Applesauce, 1/2 cup Coffee, 1 cup [place], barley soup, 3/4 cup Minced Hawaiian chicken, 3 oz Mashed potatoes, 1/2 cup Cooked spinach, 1/2 cup Frozen yogurt, 1/2 cup Non-dairy creamer, 2 Tbsp      LEVEL 3 = CHOPPED DIET  -After all the foods in level 2 (soft diet) are passing through well you should advance up to more chopped foods.  -It is still important to cut these foods into small pieces and eat slowly.  Hot Foods Cold Foods  Poultry Cottage cheese  Chopped Swedish meatballs Yogurt  Meat salads (ground or flaked meat) Milk  Flaked fish (tuna) Milkshakes  Poached or scrambled eggs Soft, cold, dry cereal  Souffles and omelets Fruit juices or nectars  Cooked cereals Chopped canned fruit  Chopped [place] toast or pancakes Canned fruit cocktail  Noodles or pasta (no rice) Pudding, mousse, custard  Cooked vegetables (no frozen peas, corn, or mixed vegetables) Green salad  Canned small sweet peas Ice cream  Creamed soup or vegetable soup Fruit ice, [place] ice  Pureed vegetable soup or alphabet soup Non-dairy creamer  Ground scalloped apples Margarine  Gravies Mayonnaise  Sauces:  Cheese, creamed, barbecue, tomato, white Ketchup  Coffee or tea Mustard   SAMPLE MENU:  LEVEL 3 Breakfast Lunch Dinner  Orange juice, 1/2 cup Oatmeal, 1/2 cup Scrambled eggs with cheese, 1/2 cup Decaffeinated tea, 1 cup Whole milk, 1 cup Non-dairy creamer, 2 Tbsp Ketchup, 1 Tbsp Margarine, 1 tsp Salt, 1/4 tsp Sugar, 2 tsp Pineapple juice, 1/2 cup Ground beef, 3 oz Gravy, 2 Tbsp Mashed potatoes, 1/2 cup Cooked spinach, 1/2 cup Applesauce, 1/2 cup Decaffeinated coffee Whole milk Non-dairy creamer, 2 Tbsp Margarine, 1 tsp Salt, 1/4 tsp Pureed [place], barley soup, 3/4 cup Barbecue chicken, 3 oz Mashed potatoes, 1/2 cup Ground fresh broccoli, 1/2 cup Frozen yogurt, 1/2 cup Decaffeinated tea, 1 cup Non-dairy creamer, 2 Tbsp Margarine, 1 tsp Salt, 1/4 tsp Sugar, 1 tsp    LEVEL  4:  REGULAR FOODS  -Foods in this group are soft, moist, regularly textured foods.   -This level includes meat and  breads, which tend to be the hardest things to swallow.   -Eat very slowly, chew well and continue to avoid carbonated drinks. -most people are at this level in 4-6 weeks  Hot Foods Cold Foods  Baked fish or skinned Soft cheeses - cottage cheese  Souffles and omelets Cream cheese  Eggs Yogurt  Stuffed shells Milk  Spaghetti with meat sauce Milkshakes  Cooked cereal Cold dry cereals (no nuts, dried fruit, coconut)  [place] toast or pancakes Crackers  Buttered toast Fruit juices or nectars  Noodles or pasta (no rice) Canned fruit  Potatoes (all types) Ripe bananas  Soft, cooked vegetables (no corn, lima, or baked beans) Peeled, ripe, fresh fruit  Creamed soups or vegetable soup Cakes (no nuts, dried fruit, coconut)  Canned chicken noodle soup Plain doughnuts  Gravies Ice cream  Bacon dressing Pudding, mousse, custard  Sauces:  Cheese, creamed, barbecue, tomato, white Fruit ice, [place] ice, sherbet  Decaffeinated tea or coffee Whipped gelatin  Pork chops Regular gelatin   Canned fruited gelatin molds   Sugar, syrup, honey, jam, jelly   Cream   Non-dairy   Margarine   Oil   Mayonnaise   Ketchup   Mustard   TROUBLESHOOTING IRREGULAR BOWELS  1) Avoid extremes of bowel movements (no bad constipation/diarrhea)  2) Miralax 17gm mixed in 8oz. water or juice-daily. May use BID as needed.  3) Gas-x,Phazyme, etc. as needed for gas & bloating.  4) Soft,bland diet. No spicy,greasy,fried foods.  5) Prilosec over-the-counter as needed  6) May hold gluten/wheat products from diet to see if symptoms improve.  7) May try probiotics (Align, Activa, etc) to help calm the bowels down    If you have any questions please call our office at [place]: [phone].

## 2022-05-22 NOTE — Progress Notes (Signed)
Transition of Care [facility]) Screening Note  Patient Details  Name: ILEIGH METTLER Date of Birth: 07-19-1961  Transition of [facility]) CM/SW Contact:    Sherie Don, LCSW Phone Number: 05/22/2022, 11:46 AM  Transition of Care Department [facility]) has reviewed patient and no TOC needs have been identified at this time. We will continue to monitor patient advancement through interdisciplinary progression rounds. If new patient transition needs arise, please place a TOC consult.

## 2022-05-22 NOTE — Progress Notes (Signed)
Provided discharge education/instructions, all questions and concerns addressed. Pt not in acute distress, discharged home with belongings accompanied by family. 

## 2022-05-22 NOTE — Anesthesia Postprocedure Evaluation (Signed)
Anesthesia Post Note  Patient: Gina Wise  Procedure(s) Performed: XI ROBOTIC ASSISTED HIATAL HERNIA REPAIR WITH TOUPET FUNDOPLICATION (Abdomen)     Patient location during evaluation: PACU Anesthesia Type: General Level of consciousness: awake and alert Pain management: pain level controlled Vital Signs Assessment: post-procedure vital signs reviewed and stable Respiratory status: spontaneous breathing, nonlabored ventilation and respiratory function stable Cardiovascular status: blood pressure returned to baseline and stable Postop Assessment: no apparent nausea or vomiting Anesthetic complications: no   No notable events documented.  Last Vitals:  Vitals:   05/22/22 0809 05/22/22 1145  BP: 130/80 135/68  Pulse: 72 70  Resp: 17 17  Temp: (!) 36.4 C 36.4 C  SpO2: 97% 93%    Last Pain:  Vitals:   05/22/22 1145  TempSrc: Oral  PainSc:    Pain Goal: Patients Stated Pain Goal: 3 (05/22/22 0006)                 Lynda Rainwater

## 2022-07-10 ENCOUNTER — Other Ambulatory Visit (HOSPITAL_COMMUNITY): Payer: Self-pay | Admitting: Surgery

## 2022-07-10 ENCOUNTER — Other Ambulatory Visit: Payer: Self-pay | Admitting: Surgery

## 2022-07-10 DIAGNOSIS — R110 Nausea: Secondary | ICD-10-CM

## 2022-07-19 ENCOUNTER — Ambulatory Visit (HOSPITAL_COMMUNITY)
Admission: RE | Admit: 2022-07-19 | Discharge: 2022-07-19 | Disposition: A | Discharge status: Home / Self Care | Payer: 59 | Source: Ambulatory Visit | Attending: Nurse Practitioner | Admitting: Nurse Practitioner

## 2022-07-19 ENCOUNTER — Ambulatory Visit (HOSPITAL_COMMUNITY)
Admission: RE | Admit: 2022-07-19 | Discharge: 2022-07-19 | Disposition: A | Discharge status: Home / Self Care | Payer: 59 | Source: Ambulatory Visit | Attending: Surgery | Admitting: Surgery

## 2022-07-19 DIAGNOSIS — R110 Nausea: Secondary | ICD-10-CM | POA: Insufficient documentation

## 2022-07-19 DIAGNOSIS — R197 Diarrhea, unspecified: Secondary | ICD-10-CM | POA: Insufficient documentation

## 2022-09-25 ENCOUNTER — Encounter: Payer: Self-pay | Admitting: Family Medicine

## 2022-09-25 ENCOUNTER — Ambulatory Visit: Payer: 59 | Admitting: Family Medicine

## 2022-09-25 VITALS — BP 140/80 | HR 80 | Temp 97.8°F | Resp 16 | Ht 66.0 in | Wt 187.4 lb

## 2022-09-25 DIAGNOSIS — R112 Nausea with vomiting, unspecified: Secondary | ICD-10-CM | POA: Diagnosis not present

## 2022-09-25 DIAGNOSIS — R110 Nausea: Secondary | ICD-10-CM

## 2022-09-25 DIAGNOSIS — R109 Unspecified abdominal pain: Secondary | ICD-10-CM

## 2022-09-25 LAB — CBC
HCT: 40.4 % (ref 36.0–46.0)
Hemoglobin: 14 g/dL (ref 12.0–15.0)
MCHC: 34.6 g/dL (ref 30.0–36.0)
MCV: 85.5 fl (ref 78.0–100.0)
Platelets: 323 10*3/uL (ref 150.0–400.0)
RBC: 4.73 Mil/uL (ref 3.87–5.11)
RDW: 14.3 % (ref 11.5–15.5)
WBC: 6 10*3/uL (ref 4.0–10.5)

## 2022-09-25 LAB — COMPREHENSIVE METABOLIC PANEL
ALT: 11 U/L (ref 0–35)
AST: 14 U/L (ref 0–37)
Albumin: 4.4 g/dL (ref 3.5–5.2)
Alkaline Phosphatase: 68 U/L (ref 39–117)
BUN: 15 mg/dL (ref 6–23)
CO2: 27 mEq/L (ref 19–32)
Calcium: 9.2 mg/dL (ref 8.4–10.5)
Chloride: 101 mEq/L (ref 96–112)
Creatinine, Ser: 0.8 mg/dL (ref 0.40–1.20)
GFR: 80 mL/min (ref >60.00)
Glucose, Bld: 87 mg/dL (ref 70–99)
Potassium: 3.5 mEq/L (ref 3.5–5.1)
Sodium: 138 mEq/L (ref 135–145)
Total Bilirubin: 0.5 mg/dL (ref 0.2–1.2)
Total Protein: 7.4 g/dL (ref 6.0–8.3)

## 2022-09-25 LAB — LIPASE: Lipase: 43 U/L (ref 11.0–59.0)

## 2022-09-25 LAB — AMYLASE: Amylase: 37 U/L (ref 27–131)

## 2022-09-25 MED ORDER — ONDANSETRON HCL 8 MG PO TABS: 8.0000 mg | ORAL_TABLET | Freq: Three times a day (TID) | ORAL | 0 refills | Status: DC | PRN

## 2022-09-25 NOTE — Patient Instructions (Addendum)
Starting with labs and ultrasound.  Recommend you reach out to your GI as well so they can further investigate Gentle diet Sending in Zofran for nausea

## 2022-09-25 NOTE — Progress Notes (Signed)
Acute Office Visit  Subjective:     Patient ID: Gina Wise, female    DOB: 1961-01-30, 61 y.o.   MRN: [id number]  Chief Complaint  Patient presents with   GI Problem   nodule under sternum       Patient had hiatal hernia surgery at the end of July.  Reports that just a few weeks ago she started having some symptoms again including nausea, vomiting, feeling a bulge/knot in her epigastric region.  She has also had some indigestion and increased flatulence.  Sometimes she will feel pressure buildup in the epigastric region and spread throughout her chest making it difficult for her to breathe due to the discomfort.  She has had 2 severe episodes like that so far.  Otherwise she has ongoing nausea with some dry heaving.  She has been struggling with constipation.  Last BM was last Thursday and at that time she needed Linzess to trigger a bowel movement.  She has noticed some blood and mucus in her stool.  She has not followed up with her GI doctor since the surgery.  She also reporting poor appetite and feeling full quickly, only eating about 1 good meal a day.  She is concerned about her gallbladder.    ROS All review of systems negative except what is listed in the HPI      Objective:    BP (!) 140/80   Pulse 80   Temp 97.8 F (36.6 C)   Resp 16   Ht '5\' 6"'$  (1.676 m)   Wt 187 lb 6.4 oz (85 kg)   LMP 04/03/2012 Comment: Still has Cervix and Ovaries  SpO2 99%   BMI 30.25 kg/m    Physical Exam Vitals reviewed.  Constitutional:      General: She is not in acute distress.    Appearance: Normal appearance. She is obese. She is not ill-appearing.  Cardiovascular:     Rate and Rhythm: Normal rate and regular rhythm.  Pulmonary:     Effort: Pulmonary effort is normal.     Breath sounds: Normal breath sounds.  Abdominal:     General: There is no distension.     Palpations: There is no mass.     Tenderness: There is abdominal tenderness in the epigastric area and left upper  quadrant. There is no guarding or rebound.  Skin:    General: Skin is warm and dry.     Findings: No rash.  Neurological:     General: No focal deficit present.     Mental Status: She is alert and oriented to person, place, and time. Mental status is at baseline.  Psychiatric:        Mood and Affect: Mood normal.        Behavior: Behavior normal.        Thought Content: Thought content normal.        Judgment: Judgment normal.     No results found for any visits on 09/25/22.      Assessment & Plan:   Problem List Items Addressed This Visit   None Visit Diagnoses     Nausea    -  Primary   Relevant Orders   Amylase   Lipase   CBC   Comprehensive metabolic panel   US Abdomen Complete   Abdominal discomfort       Relevant Orders   Amylase   Lipase   CBC   Comprehensive metabolic panel   US Abdomen Complete  Nausea and vomiting, unspecified vomiting type       Relevant Medications   ondansetron (ZOFRAN) 8 MG tablet      Starting with labs and ultrasound.  Recommend you reach out to your GI as well so they can further investigate Gentle diet Sending in Zofran for nausea Please contact office for follow-up if symptoms do not improve or worsen. Seek emergency care if symptoms become severe.   Meds ordered this encounter  Medications   ondansetron (ZOFRAN) 8 MG tablet    Sig: Take 1 tablet (8 mg total) by mouth every 8 (eight) hours as needed for nausea or vomiting.    Dispense:  60 tablet    Refill:  0    Order Specific Question:   Supervising Provider    Answer:   Penni Homans A [[id number]]    Return if symptoms worsen or fail to improve.  Terrilyn Saver, NP

## 2022-09-26 ENCOUNTER — Encounter: Payer: Self-pay | Admitting: Family Medicine

## 2022-10-08 ENCOUNTER — Ambulatory Visit (HOSPITAL_BASED_OUTPATIENT_CLINIC_OR_DEPARTMENT_OTHER)
Admission: RE | Admit: 2022-10-08 | Discharge: 2022-10-08 | Disposition: A | Discharge status: Home / Self Care | Payer: 59 | Source: Ambulatory Visit | Attending: Family Medicine | Admitting: Family Medicine

## 2022-10-08 DIAGNOSIS — R109 Unspecified abdominal pain: Secondary | ICD-10-CM | POA: Diagnosis present

## 2022-10-08 DIAGNOSIS — R110 Nausea: Secondary | ICD-10-CM | POA: Insufficient documentation

## 2022-10-26 ENCOUNTER — Other Ambulatory Visit: Payer: Self-pay | Admitting: Family Medicine

## 2022-10-29 ENCOUNTER — Other Ambulatory Visit: Payer: Self-pay

## 2022-10-29 DIAGNOSIS — G43909 Migraine, unspecified, not intractable, without status migrainosus: Secondary | ICD-10-CM

## 2022-10-29 NOTE — Addendum Note (Signed)
Addended by: Randolm Idol A on: 10/29/2022 04:01 PM   Modules accepted: Orders

## 2022-10-29 NOTE — Telephone Encounter (Addendum)
Pt needs Sumatriptan 100 mg sent in

## 2022-10-30 MED ORDER — SUMATRIPTAN SUCCINATE 100 MG PO TABS: ORAL_TABLET | ORAL | 2 refills | Status: DC

## 2022-11-02 ENCOUNTER — Encounter: Payer: Self-pay | Admitting: Physician Assistant

## 2022-11-02 ENCOUNTER — Ambulatory Visit (INDEPENDENT_AMBULATORY_CARE_PROVIDER_SITE_OTHER): Payer: 59 | Admitting: Physician Assistant

## 2022-11-02 VITALS — BP 118/74 | HR 86 | Ht 66.0 in | Wt 190.2 lb

## 2022-11-02 DIAGNOSIS — Z8719 Personal history of other diseases of the digestive system: Secondary | ICD-10-CM

## 2022-11-02 DIAGNOSIS — Z9889 Other specified postprocedural states: Secondary | ICD-10-CM

## 2022-11-02 DIAGNOSIS — K5904 Chronic idiopathic constipation: Secondary | ICD-10-CM | POA: Diagnosis not present

## 2022-11-02 DIAGNOSIS — R112 Nausea with vomiting, unspecified: Secondary | ICD-10-CM

## 2022-11-02 DIAGNOSIS — R1084 Generalized abdominal pain: Secondary | ICD-10-CM

## 2022-11-02 MED ORDER — LUBIPROSTONE 8 MCG PO CAPS: 8.0000 ug | ORAL_CAPSULE | Freq: Two times a day (BID) | ORAL | 3 refills | Status: DC

## 2022-11-02 MED ORDER — PREDNISONE 50 MG PO TABS: ORAL_TABLET | ORAL | 0 refills | Status: DC

## 2022-11-02 NOTE — Progress Notes (Signed)
Subjective:    Patient ID: Gina Wise, female    DOB: 1960/12/07, 62 y.o.   MRN: [id number]  HPI  Gina Wise is a pleasant 62 year old white female, established with Dr. Hilarie Fredrickson.  Last seen here in July 2023 when she underwent EGD with finding of the low-grade Schatzki's ring which was dilated to 20 mm and noted to have a 5 cm hiatal hernia and mild gastritis. She had been referred to surgery for hiatal hernia repair and underwent repair of paraesophageal hernia with a toupee procedure July 2023 per Dr. Windle Guard.  Patient says she did not do well after surgery and had ongoing issues with retching and dry heaves.  She eventually underwent an upper GI per Dr. Windle Guard in September 2023 for complaints of epigastric pain was found to have a normal-appearing esophagus, no GERD the wrap was not definitely identified.  Patient declined the barium tablet. Says eventually she went from gagging/dry heaves to actual vomiting somewhere around mid November 2023.  Since then she has had episodes of vomiting a couple of times per week.  She also developed significant problems with constipation after her surgery and that has continued as well.  She says she will to have a bowel movement all week because if she takes a low-dose Linzess 72 mg tablet that will resulted in multiple episodes of diarrhea and then she is unable to work.  So most weeks she is not having a bowel movement for about 6 days and on Saturday will generally take and Linzess which results in a bowel purge of sorts with multiple diarrheal stools and sometimes an episode of vomiting. She says she feels like she is stuck in a vicious cycle with this. She had tried MiraLAX in the past which she did not find effective. She says she is tired of having abdominal discomfort bloating and fullness.  She is also concerned about a "knot" that she feels high in the epigastrium which came on around the time that she started having the vomiting in November She feels a  protrusion in that area which has seemed to have migrated upward over the past couple of months.  She says usually she cannot feel this lying down but does feel it if she is standing up for sitting high in the epigastrium and says it is uncomfortable at times.  Concerned about whether her wrap has loosened or is still effective in what is causing the knot sensation in the upper abdomen.. This time she is not having ongoing problems with heartburn or indigestion on a daily basis, she has Nexium but is only using this as needed and most of the time will just use Tums.  Upper abdominal ultrasound December 2023 no gallstones CBD 3 mm, mild hepatic steatosis  Colonoscopy had been done in May 2021 for diarrhea, noted to have multiple diverticuli and some mild diffuse erythema in the rectosigmoid area.  Biopsy showed acute inflammation but no chronicity.  She was treated with a course of Xifaxan at that time. 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 Also with history of lupus and interstitial lung disease as well as hypothyroidism. She underwent vaginal hysterectomy and anterior and posterior prolapse surgery.  Review of Systems Pertinent positive and negative review of systems were noted in the above HPI section.  All other review of systems was otherwise negative.   Outpatient Encounter Medications as of 11/02/2022  Medication Sig   carboxymethylcellulose (REFRESH  PLUS) 0.5 % SOLN Place 1 drop into both eyes 3 (three) times daily as needed (dry/irritated eyes).   cetirizine (ZYRTEC) 10 MG tablet Take 10 mg by mouth daily.   Chlorphen-Pseudoephed-APAP (TYLENOL ALLERGY SINUS PO) Take 1-2 tablets by mouth daily as needed (sinus congestion).   diphenhydrAMINE (BENADRYL) 25 MG tablet Take 25 mg by mouth every 6 (six) hours as needed for allergies.   esomeprazole (NEXIUM) 40 MG capsule TAKE 1 CAPSULE BY MOUTH TWICE DAILY   hydrochlorothiazide  (HYDRODIURIL) 25 MG tablet TAKE 1 TABLET(25 MG) BY MOUTH DAILY   levothyroxine (SYNTHROID) 125 MCG tablet TAKE 1 TABLET(125 MCG) BY MOUTH DAILY   levothyroxine (SYNTHROID) 25 MCG tablet Take 1 tablet (25 mcg total) by mouth daily. Take in addition to your 125 mg tablet to total 150 mg   linaclotide (LINZESS) 72 MCG capsule Take 72 mcg by mouth 4 (four) times a week.   lubiprostone (AMITIZA) 8 MCG capsule Take 1 capsule (8 mcg total) by mouth 2 (two) times daily with a meal.   ondansetron (ZOFRAN) 8 MG tablet Take 1 tablet (8 mg total) by mouth every 8 (eight) hours as needed for nausea or vomiting.   predniSONE (DELTASONE) 50 MG tablet Take 1 tablet 13 hours before the CT, take 1 tablets 7 hours before the CT, and take 1 tablet 1 hour before the CT.   sertraline (ZOLOFT) 100 MG tablet Take 0.5 tablets (50 mg total) by mouth daily. Take 1/2 (one-half) tablet by mouth once daily   SUMAtriptan (IMITREX) 100 MG tablet May repeat in 2 hours if headache persists(Plz sched with new provider)   No facility-administered encounter medications on file as of 11/02/2022.   Allergies  Allergen Reactions   Contrast Media [Iodinated Contrast Media] Hives and Itching   Other Hives and Swelling    CATS AND CAT DANDER   Codeine Nausea Only   Erythromycin Nausea And Vomiting    Severe abdominal pain   Iohexol Hives   Patient Active Problem List   Diagnosis Date Noted   S/P repair of paraesophageal hernia 05/21/2022   Dysphagia    History of prediabetes 12/21/2021   Clostridium difficile diarrhea 07/28/2020   Upper respiratory infection 07/13/2019   COVID-19 03/12/2019   Acute maxillary sinusitis 12/26/2018   Enterocele 07/23/2018   Colitis 05/29/2018   BMI 31.0-31.9,adult 01/09/2018   Pelvic floor weakness in female 12/11/2017   Disequilibrium 09/03/2017   Hypokalemia 08/26/2017   Right sided numbness 08/25/2017   Right-sided muscle weakness 08/25/2017   Vertigo 08/25/2017   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   Allergic rhinitis 07/13/2016   Lupus (systemic lupus erythematosus) ([place]) 02/17/2016   Interstitial lung disease due to connective tissue disease ([place]) 11/07/2015   Lung disease with systemic lupus erythematosus ([place]) 07/14/2015   Throat clearing 08/10/2014   Hyperlipidemia 08/10/2014   Schatzki's ring 03/09/2014   Unspecified constipation 02/23/2014   GERD (gastroesophageal reflux disease) 02/23/2014   Goiter 07/03/2013   Chronic cough 01/02/2013   Iron deficiency 03/13/2012   Dysfunctional uterine bleeding 01/02/2012   Insomnia 10/03/2011   Overweight 01/03/2011   MAJOR DPRSV DISORDER RECURRENT EPISODE MODERATE 10/12/2009   Migraine 10/04/2009   Hypothyroidism 09/09/2009   Essential hypertension, benign 09/09/2009   Social History   Socioeconomic History   Marital status: Single    Spouse name: Not on file   Number of children: Not on file   Years of education: Not on file   Highest education level: Not on file  Occupational History   Not on file  Tobacco Use   Smoking status: Never   Smokeless tobacco: Never  Vaping Use   Vaping Use: Never used  Substance and Sexual Activity   Alcohol use: Not Currently    Alcohol/week: 0.0 standard drinks of alcohol    Comment: socially   Drug use: No   Sexual activity: Not on file  Other Topics Concern   Not on file  Social History Narrative   Works at [employer]   Social Determinants of Radio broadcast assistant Strain: Not on file  Food Insecurity: Not on file  Transportation Needs: Not on file  Physical Activity: Not on file  Stress: Not on file  Social Connections: Not on file  Intimate Partner Violence: Not on file    Gina Wise's family history includes Alcohol abuse in her father and maternal grandmother; Allergy (severe) in her son; Aneurysm in her paternal grandfather; Arthritis in her sister; Breast cancer in her paternal aunt; Colon cancer in her maternal grandfather; Depression in her  mother and sister; Early death in her father, maternal uncle, and paternal uncle; Emphysema in her father and paternal aunt; Endometriosis in her mother and sister; Hearing loss in her father and mother; Heart attack in her maternal uncle; Heart disease in her mother and paternal grandmother; Hepatitis in her mother; Hypertension in her father and mother; Lung cancer in her paternal uncle, paternal uncle, and paternal uncle; Lung disease in her paternal aunt; Lupus in her sister; Pancreatic disease in her mother; Parkinson's disease in her maternal grandmother; Uterine cancer in her maternal aunt; Vision loss in her father.      Objective:    Vitals:   11/02/22 1513  BP: 118/74  Pulse: 86    Physical Exam Well-developed well-nourished  WF  in no acute distress.  Height, Weight, 190 BMI 30  HEENT; nontraumatic normocephalic, EOMI, PE R LA, sclera anicteric. Oropharynx;not examined Neck; supple, no JVD Cardiovascular; regular rate and rhythm with S1-S2, no murmur rub or gallop Pulmonary; Clear bilaterally-she is tender over the xiphoid process Abdomen; soft,  nondistended, I cannot demonstrate a ventral hernia in the epigastrium, she does have some tenderness in the subxiphoid area and tenderness directly over the xiphoid process.  Also some mild bilateral mid lower quadrant tenderness no guarding or rebound no palpable mass or hepatosplenomegaly, bowel sounds are active Rectal; not done today Skin; benign exam, no jaundice rash or appreciable lesions Extremities; no clubbing cyanosis or edema skin warm and dry Neuro/Psych; alert and oriented x4, grossly nonfocal mood and affect appropriate        Assessment & Plan:   #36 62 year old white female status post repair of a large paraesophageal hernia with toupee procedure July 2023/Dr. Conner who reports ongoing problems ever since surgery with dry heaves and retching. Upper GI September 2023 unremarkable with normal-appearing esophagus,  wrap not definitely identified. In November 2023 she developed vomiting and around that same time developed a knot sensation in the subxiphoid area.  Both of the symptoms have persisted with intermittent vomiting, and a "knot sensation high in the epigastrium/subxiphoid area.  Etiology of the intermittent vomiting is not clear.  She did not have any evidence of loosening of wrap as of September 0300 but that certainly may have occurred in the interim though she does not have significant reflux symptoms. I cannot identify/palpate a ventral hernia on exam, she does have tenderness of the xiphoid process which may be what she is feeling.  There may be costochondritis type inflammation in that area from repeated retching and vomiting.  #2 new constipation over the past 6 months since surgery, had previously tended towards diarrhea.  Generally only having a bowel movement once per week precipitated by Linzess which then causes diarrhea and an episode of vomiting. she is also complaining of ongoing fullness and lower abdominal discomfort.  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  Lupus with interstitial lung disease #5.  History of Schatzki's ring status post dilation July 2023 #6 Hypothyroidism  Plan; we will schedule for CT of the abdomen/pelvis with contrast.  Patient does have contrast allergy but has done well with Benadryl and prednisone regimen prior to CT. She will be given prednisone 50 mg 13 hours prior to CT, 7 hours prior to CT and 1 hour post CT Benadryl 50 mg 1 hour prior to CT and can repeat in 6 hours if needed  Hold Linzess for now and start trial of Amitiza 8 mcg p.o. daily, she can titrate this to 8 mg p.o. twice daily depending on her response to the Amitiza.  Further recommendations pending above. She will be scheduled for follow-up office visit with Dr.  Hilarie Fredrickson in 4-6 weeks  Avielle Imbert Genia Harold PA-C 11/02/2022   Cc: Terrilyn Saver, NP

## 2022-11-02 NOTE — Patient Instructions (Addendum)
_______________________________________________________  If your blood pressure at your visit was 140/90 or greater, please contact your primary care physician to follow up on this.  _______________________________________________________  If you are age 61 or older, your body mass index should be between 23-30. Your Body mass index is 30.71 kg/m. If this is out of the aforementioned range listed, please consider follow up with your Primary Care Provider.  If you are age 20 or younger, your body mass index should be between 19-25. Your Body mass index is 30.71 kg/m. If this is out of the aformentioned range listed, please consider follow up with your Primary Care Provider.   ________________________________________________________  The [facility] providers would like to encourage you to use [facility] to communicate with providers for non-urgent requests or questions.  Due to long hold times on the telephone, sending your provider a message by [facility] may be a faster and more efficient way to get a response.  Please allow 48 business hours for a response.  Please remember that this is for non-urgent requests.  _______________________________________________________  We have sent Amitiza '8mg'$  to the pharmacy, please start with 1 a day and increase to twice a day if needed. Use this instead of the Linzess.  Prednisone 50 mg by mouth 13 hours, 7 hours, and 1 hour before contrast injection.   diphenhydrAMINE (BENADRYL) 50 mg by mouth within 1 hour of contrast injection.  You have been scheduled for a CT scan of the abdomen and pelvis at [facility], 1st floor Radiology. You are scheduled on 11-15-2022 at 12;30pm. You should arrive 2 hours prior to your appointment time for registration and to drink the contrast.   1) Do not eat anything after 8:30am (4 hours prior to your test)   You may take any medications as prescribed with a small amount of water, if necessary. If you take any of the  following medications: METFORMIN, GLUCOPHAGE, GLUCOVANCE, AVANDAMET, RIOMET, FORTAMET, [place] MET, JANUMET, GLUMETZA or METAGLIP, you MAY be asked to HOLD this medication 48 hours AFTER the exam.   The purpose of you drinking the oral contrast is to aid in the visualization of your intestinal tract. The contrast solution may cause some diarrhea. Depending on your individual set of symptoms, you may also receive an intravenous injection of x-ray contrast/dye. Plan on being at [facility] for 45 minutes or longer, depending on the type of exam you are having performed.   If you have any questions regarding your exam or if you need to reschedule, you may call [facility] at [phone] between the hours of 8:00 am and 5:00 pm, Monday-Friday.

## 2022-11-07 NOTE — Progress Notes (Signed)
Addendum: Reviewed and agree with assessment and management plan. Jessicalynn Deshong M, MD  

## 2022-11-15 ENCOUNTER — Ambulatory Visit (HOSPITAL_COMMUNITY)
Admission: RE | Admit: 2022-11-15 | Discharge: 2022-11-15 | Disposition: A | Discharge status: Home / Self Care | Payer: 59 | Source: Ambulatory Visit | Attending: Physician Assistant | Admitting: Physician Assistant

## 2022-11-15 DIAGNOSIS — R1084 Generalized abdominal pain: Secondary | ICD-10-CM | POA: Insufficient documentation

## 2022-11-15 DIAGNOSIS — K5904 Chronic idiopathic constipation: Secondary | ICD-10-CM

## 2022-11-15 DIAGNOSIS — R112 Nausea with vomiting, unspecified: Secondary | ICD-10-CM | POA: Diagnosis present

## 2022-11-15 DIAGNOSIS — Z8719 Personal history of other diseases of the digestive system: Secondary | ICD-10-CM

## 2022-11-15 DIAGNOSIS — Z9889 Other specified postprocedural states: Secondary | ICD-10-CM | POA: Diagnosis present

## 2022-11-15 MED ORDER — IOHEXOL 300 MG/ML  SOLN
100.0000 mL | Freq: Once | INTRAMUSCULAR | Status: AC | PRN
  Administered 2022-11-15: 100 mL via INTRAVENOUS

## 2022-11-16 ENCOUNTER — Telehealth: Payer: Self-pay

## 2022-11-16 NOTE — Telephone Encounter (Signed)
Conversations: New Symptoms (Newest Message First) November 16, 2022 Gina Wise  to [facility] (supporting Gina Ferguson, PA-C)      11/16/22 10:06 AM I am taking the nexium and eating alot of tums. I  am having shortness of breath, coughing, burping acid, food comes back up and no bowel movement  until I take linzess. I tried the new med and I  went 13 days before a bowel movement.  The sore lump showed up when I graduated from dry heaves to vomitting. It is sitting up high in front of my breast bone now, started much lower. Same symptoms that I had before hiatal hernia surgery. Not as bad but the symptoms are increasing quickly. When I inhale it sets the coughing off. Standing very straight usually helps to  stop it. Me  to ADESUWA OSGOOD      11/16/22  9:36 AM The results of the scan are available and do not give a cause for the symptoms you are experiencing. Please confirm for me that you are Taking daily esomeprazole. Can you elaborate on the shortness of breath you are experiencing?  Amy is out of the office today.   Last read by Gina Wise at  9:59 AM on 11/16/2022. November 15, 2022 Gina Wise  to [facility] Clinical Pool (supporting Gina Ferguson, PA-C)      11/15/22  9:23 PM Hi Dr. Trellis Paganini,   [facility] you are doing well. I  had the scan done today at [facility]. Since our appointment the knot has moved up higher up again and more to the left. It's  very tender. I am burping up and acid and regurgitated food. Also getting  short of breath again. I am hoping that the scan can point [place] in the right direction.   Have a good day, Gina Wise

## 2022-11-16 NOTE — Telephone Encounter (Signed)
Reviewed. I do not have a good feel for what is going on with this patient.  It did not appear that Amy did, either.. Her CT from yesterday was unremarkable. If shortness of breath is a chief complaint, she should see her PC or go to the ER..  I can not attribute a GI condition to cause this particular symptom. Short of that, you might reach out to Amy (if she is here), to see if she has additional input. As always, for severe symptoms, go to the emergency room

## 2022-11-16 NOTE — Telephone Encounter (Signed)
Spoke with the patient. She understands the recommendations. Agrees to go to the ER if she is having severe symptoms, especially if she cannot breath well.

## 2022-11-16 NOTE — Telephone Encounter (Signed)
DOD Patient of Dr Hilarie Fredrickson who was last seen by Nicoletta Ba, PA-C.  I called the patient in response to her message sent to [place] through My Chart.   She states since she was seen on 11/02/22 by Amy, her symptoms have suddenly worsened. She feels short of breath as she has since last fall and relates this to the vomiting and reflux. She is on Amitiza 8 mcg BID and last took it yesterday. She does not plan to take it today because "it does not work and I will go back on Linzess tomorrow." Her last bowel movement was 11/14/22. She declines to take Linzess today "because it will cause diarrhea and vomiting and I need a day to recover before going to work on Monday." She is "burping up and refluxing " anything she eats. She is taking daily Nexium and "eating lots of TUMS." She feels a "knot" in the center of her chest. Please advise.

## 2022-11-23 NOTE — Telephone Encounter (Signed)
Sorry to hear she is having so much trouble. I think we probably should repeat upper and lower endoscopy at this point to get a sense for what else is going on and guide treatment. This can be scheduled if she is agreeable

## 2022-11-27 ENCOUNTER — Other Ambulatory Visit: Payer: Self-pay

## 2022-11-27 MED ORDER — SUCRALFATE 1 GM/10ML PO SUSP: 1.0000 g | Freq: Three times a day (TID) | ORAL | 0 refills | Status: DC

## 2022-12-06 ENCOUNTER — Encounter: Payer: Self-pay | Admitting: Physician Assistant

## 2022-12-06 ENCOUNTER — Ambulatory Visit: Payer: 59 | Admitting: Physician Assistant

## 2022-12-06 VITALS — BP 120/82 | HR 92 | Ht 65.0 in | Wt 190.1 lb

## 2022-12-06 DIAGNOSIS — R1013 Epigastric pain: Secondary | ICD-10-CM

## 2022-12-06 DIAGNOSIS — R1110 Vomiting, unspecified: Secondary | ICD-10-CM

## 2022-12-06 DIAGNOSIS — K5904 Chronic idiopathic constipation: Secondary | ICD-10-CM | POA: Diagnosis not present

## 2022-12-06 DIAGNOSIS — K219 Gastro-esophageal reflux disease without esophagitis: Secondary | ICD-10-CM

## 2022-12-06 MED ORDER — NA SULFATE-K SULFATE-MG SULF 17.5-3.13-1.6 GM/177ML PO SOLN: 1.0000 | Freq: Once | ORAL | 0 refills | Status: AC

## 2022-12-06 NOTE — Patient Instructions (Addendum)
You have been scheduled for an endoscopy and colonoscopy. Please follow the written instructions given to you at your visit today. Please pick up your prep supplies at the pharmacy within the next 1-3 days. If you use inhalers (even only as needed), please bring them with you on the day of your procedure.   You have been scheduled for a gastric emptying scan at [facility] on 12/27/22 at 8:30 am. Please arrive at least 30 minutes prior to your appointment for registration. Please make certain not to have anything to eat or drink after midnight the night before your test. Hold all stomach medications (ex: Zofran, phenergan, Reglan) 8 hours prior to your test. If you need to reschedule your appointment, please contact radiology scheduling at [phone]. _____________________________________________________________________ A gastric-emptying study measures how long it takes for food to move through your stomach. There are several ways to measure stomach emptying. In the most common test, you eat food that contains a small amount of radioactive material. A scanner that detects the movement of the radioactive material is placed over your abdomen to monitor the rate at which food leaves your stomach. This test normally takes about 4 hours to complete. _____________________________________________________________________   Continue Nexium.  Continue Linzess 72 mcg.  Nothing by mouth 3 hours before bed.  Due to recent changes in healthcare laws, you may see the results of your imaging and laboratory studies on MyChart before your provider has had a chance to review them.  We understand that in some cases there may be results that are confusing or concerning to you. Not all laboratory results come back in the same time frame and the provider may be waiting for multiple results in order to interpret others.  Please give [place] 48 hours in order for your provider to thoroughly review all the results before  contacting the office for clarification of your results.    It was a pleasure to see you today!  Thank you for trusting me with your gastrointestinal care!

## 2022-12-06 NOTE — Progress Notes (Signed)
Subjective:    Patient ID: Gina Wise, female    DOB: [birth year], 61 y.o.   MRN: [id number]  HPI  [name] is a pleasant 62 year old white female, established with Dr. Hilarie Fredrickson and also recently known to myself.  She comes back in today for follow-up after last office visit on 11/02/2022. Patient had undergone EGD in July 2023 with finding of low-grade Schatzki's ring which was dilated to 20 mm and noted to have a 5 cm hiatal hernia and mild gastritis.  Due to refractory symptoms she had been referred to surgery for hiatal hernia repair and underwent repair of a paraesophageal hernia with a toupee procedure in July 2023 per Dr. Windle Guard.  Patient says that she did not do well after surgery and had ongoing issues with retching and dry heaves.  Initial upper GI shortly after surgery done in September 2023 showed a normal-appearing esophagus and no GERD, at that time the wrap was not definitely identified. Patient says that eventually last fall she went from gagging and dry heaving to actual vomiting around mid November with having episodes at least a couple of times per week.  She also developed significant problems with constipation after surgery which has continued as well.  She has been using Linzess 72 mcg but says she can only take it about once weekly on the weekend because it will cause diarrhea and she will purge her bowel.  Due to her work circumstances she cannot be having diarrhea during the week and usually will not have any more bowel movements until she takes another Linzess the following weekend. She had been tried on MiraLAX in the past which was not effective. When she was recently seen in the office I gave her a trial of Amitiza 8 mcg twice daily.  She says she took this for a few days initially did not have any bowel movements and then will end up having diarrhea which lasted for about 6 days.  She is back on Linzess 72 mcg about once weekly currently. She is also been having severe problems  with acid reflux, sour brash and regurgitation.  At last office visit she had complained of a knot sensation high in the epigastrium that she started to notice around the time she started vomiting a few months back.  Is also having epigastric discomfort on a regular basis and on further questioning today he does have early satiety symptoms with a sensation of fullness even if she has not eaten for several hours and a sensation of feeling up more quickly. She had upper abdominal ultrasound in December 2023 normal gallbladder and common bile duct of 3 mm. Last colonoscopy May 2021 done for diarrhea at that time with numerous diverticuli and some mild diffuse erythema in the rectosigmoid.  She had biopsies of that area that showed some acute inflammation but no chronicity.  She did receive a Xifaxan course at that time.  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 Over the past month she has been on twice daily Nexium with no real improvement in symptoms.  I had called in a course of Carafate for her but this was too expensive and she did not get the medication.  As mentioned above she did not do well with a trial of Amitiza. She has been eating several times per  day to help with symptoms not controlled by twice daily Nexium.  She says she has reflux and sour brash symptoms during the day but this is worse at nighttime often waking her up and this frequently requires regurgitation.  She is also having increased cough.  She denies esophageal dysphagia but says she feels that her food sits in the upper abdomen. ET of the abdomen and pelvis on 11/15/2022 shows a normal-appearing liver and gallbladder no ductal dilation, no findings in the lower chest, stable small to moderate hiatal hernia, no evidence of  obstruction or inflammatory process or abnormal fluid collections in the abdomen.  Because she had called back with no improvement in symptoms a couple of weeks ago, she has been scheduled for EGD and colonoscopy with Dr. Hilarie Fredrickson which are in mid March.   Review of Systems.Pertinent positive and negative review of systems were noted in the above HPI section.  All other review of systems was otherwise negative.   Outpatient Encounter Medications as of 12/06/2022  Medication Sig   acetaminophen (TYLENOL) 500 MG tablet Take 1,000 mg by mouth as needed.   carboxymethylcellulose (REFRESH PLUS) 0.5 % SOLN Place 1 drop into both eyes 3 (three) times daily as needed (dry/irritated eyes).   cetirizine (ZYRTEC) 10 MG tablet Take 10 mg by mouth daily.   Chlorphen-Pseudoephed-APAP (TYLENOL ALLERGY SINUS PO) Take 1-2 tablets by mouth daily as needed (sinus congestion).   diphenhydrAMINE (BENADRYL) 25 MG tablet Take 25 mg by mouth every 6 (six) hours as needed for allergies.   esomeprazole (NEXIUM) 40 MG capsule TAKE 1 CAPSULE BY MOUTH TWICE DAILY   hydrochlorothiazide (HYDRODIURIL) 25 MG tablet TAKE 1 TABLET(25 MG) BY MOUTH DAILY   levothyroxine (SYNTHROID) 125 MCG tablet TAKE 1 TABLET(125 MCG) BY MOUTH DAILY   levothyroxine (SYNTHROID) 25 MCG tablet Take 1 tablet (25 mcg total) by mouth daily. Take in addition to your 125 mg tablet to total 150 mg   linaclotide (LINZESS) 72 MCG capsule Take 72 mcg by mouth 4 (four) times a week.   Na Sulfate-K Sulfate-Mg Sulf 17.5-3.13-1.6 GM/177ML SOLN Take 1 kit by mouth once for 1 dose.   ondansetron (ZOFRAN) 8 MG tablet Take 1 tablet (8 mg total) by mouth every 8 (eight) hours as needed for nausea or vomiting.   sertraline (ZOLOFT) 100 MG tablet Take 0.5 tablets (50 mg total) by mouth daily. Take 1/2 (one-half) tablet by mouth once daily   SUMAtriptan (IMITREX) 100 MG tablet May repeat in 2 hours if headache persists(Plz sched with new provider)   [DISCONTINUED]  lubiprostone (AMITIZA) 8 MCG capsule Take 1 capsule (8 mcg total) by mouth 2 (two) times daily with a meal. (Patient not taking: Reported on 12/06/2022)   [DISCONTINUED] predniSONE (DELTASONE) 50 MG tablet Take 1 tablet 13 hours before the CT, take 1 tablets 7 hours before the CT, and take 1 tablet 1 hour before the CT.   [DISCONTINUED] sucralfate (CARAFATE) 1 GM/10ML suspension Take 10 mLs (1 g total) by mouth 4 (four) times daily -  with meals and at bedtime.   No facility-administered encounter medications on file as of 12/06/2022.   Allergies  Allergen Reactions   Contrast Media [Iodinated Contrast Media] Hives and Itching   Other Hives and Swelling    CATS AND CAT DANDER   Codeine Nausea Only   Erythromycin Nausea And Vomiting    Severe abdominal pain   Iohexol Hives   Patient Active Problem List   Diagnosis Date Noted  S/P repair of paraesophageal hernia 05/21/2022   Dysphagia    History of prediabetes 12/21/2021   Clostridium difficile diarrhea 07/28/2020   Upper respiratory infection 07/13/2019   COVID-19 03/12/2019   Acute maxillary sinusitis 12/26/2018   Enterocele 07/23/2018   Colitis 05/29/2018   BMI 31.0-31.9,adult 01/09/2018   Pelvic floor weakness in female 12/11/2017   Disequilibrium 09/03/2017   Hypokalemia 08/26/2017   Right sided numbness 08/25/2017   Right-sided muscle weakness 08/25/2017   Vertigo 08/25/2017   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   Allergic rhinitis 07/13/2016   Lupus (systemic lupus erythematosus) ([place]) 02/17/2016   Interstitial lung disease due to connective tissue disease ([place]) 11/07/2015   Lung disease with systemic lupus erythematosus ([place]) 07/14/2015   Throat clearing 08/10/2014   Hyperlipidemia 08/10/2014   Schatzki's ring 03/09/2014   Unspecified constipation 02/23/2014   GERD (gastroesophageal reflux disease) 02/23/2014   Goiter 07/03/2013   Chronic cough 01/02/2013   Iron deficiency 03/13/2012   Dysfunctional uterine  bleeding 01/02/2012   Insomnia 10/03/2011   Overweight 01/03/2011   MAJOR DPRSV DISORDER RECURRENT EPISODE MODERATE 10/12/2009   Migraine 10/04/2009   Hypothyroidism 09/09/2009   Essential hypertension, benign 09/09/2009   Social History   Socioeconomic History   Marital status: Single    Spouse name: Not on file   Number of children: 2   Years of education: Not on file   Highest education level: Not on file  Occupational History   Not on file  Tobacco Use   Smoking status: Never   Smokeless tobacco: Never  Vaping Use   Vaping Use: Never used  Substance and Sexual Activity   Alcohol use: Not Currently    Alcohol/week: 0.0 standard drinks of alcohol    Comment: socially   Drug use: No   Sexual activity: Not on file  Other Topics Concern   Not on file  Social History Narrative   Works at [employer]   Social Determinants of Radio broadcast assistant Strain: Not on file  Food Insecurity: Not on file  Transportation Needs: Not on file  Physical Activity: Not on file  Stress: Not on file  Social Connections: Not on file  Intimate Partner Violence: Not on file    Ms. Melichar's family history includes Alcohol abuse in her father and maternal grandmother; Allergy (severe) in her son; Aneurysm in her paternal grandfather; Arthritis in her sister; Breast cancer in her paternal aunt; Colon cancer in her maternal grandfather; Depression in her mother and sister; Early death in her father, maternal uncle, and paternal uncle; Emphysema in her father and paternal aunt; Endometriosis in her mother and sister; Hearing loss in her father and mother; Heart attack in her maternal uncle; Heart disease in her mother and paternal grandmother; Hepatitis in her mother; Hypertension in her father and mother; Lung cancer in her paternal uncle, paternal uncle, and paternal uncle; Lung disease in her paternal aunt; Lupus in her sister; Pancreatic disease in her mother; Parkinson's disease in her  maternal grandmother; Uterine cancer in her maternal aunt; Vision loss in her father.      Objective:    Vitals:   12/06/22 1325  BP: 120/82  Pulse: 92    Physical Exam.Well-developed well-nourished WF in no acute distress.  Height, Weight190, BMI31.6  HEENT; nontraumatic normocephalic, EOMI, PE R LA, sclera anicteric. Oropharynx;not examined today  Neck; supple, no JVD Cardiovascular; regular rate and rhythm with S1-S2, no murmur rub or gallop Pulmonary; Clear bilaterally Abdomen; soft, there is mild  tenderness in the epigastrium, nondistended, no palpable mass or hepatosplenomegaly, bowel sounds are active, tender over Xyphoid process Rectal;not done Skin; benign exam, no jaundice rash or appreciable lesions Extremities; no clubbing cyanosis or edema skin warm and dry Neuro/Psych; alert and oriented x4, grossly nonfocal mood and affect appropriate        Assessment & Plan:   #34 62 year old white female status post repair of a paraesophageal hernia with toupee procedure July 2023/Dr. Conner.  This was done for refractory reflux symptoms, heartburn and indigestion.  Patient did not have a smooth postoperative course with a lot of dry heaves and retching, barium swallow in September 2023 was negative with no evidence of recurrence of hernia and no stricture.  Patient says her symptoms progressed to actual vomiting on a frequent basis in November, and she has had refractory heartburn indigestion sour brash and regurgitation since  She is now back on twice daily Nexium 40 mg AC with minimal improvement in symptoms.  She is taking frequent Tums during the day.  CT of the abdomen and pelvis as above does show recurrence of a moderate hiatal hernia.  Unfortunately appears her wrap has loosened and she is again having refractory GERD symptoms.  She does endorse some early satiety type symptoms, wonder if she may have gastroparesis contributing to symptoms.  #2 epigastric discomfort  and knot sensation in the lower chest, she is tender over the xiphoid process but no hernia or other abnormality noted on recent CT  #3 severe constipation since surgery July 2023-currently on Linzess 72 mcg, using this about once weekly which results in several episodes of diarrhea, then no bowel movements until the following weekend.  Patient did not have good response to Amitiza trial recently, results with MiraLAX in the past  #4 Lupus with interstitial lung disease 5.  Hypertension 6.  History of C. 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  Depression #9 colon cancer screening-up-to-date with last colonoscopy 2021 no polyps, multiple diverticuli  Plan; Patient is scheduled for EGD and colonoscopy with Dr. Hilarie Fredrickson mid March 2024 due to persistent symptoms as outlined above. In the interim we will continue Nexium 40 mg p.o. twice daily AC Add trial of Pepcid Complete 1 p.o. twice daily during the day and advised a nighttime dose higher to bedtime N.p.o. for 3 hours prior to bedtime and elevate back 45 degrees for sleep Will schedule for gastric emptying scan (patient says she had been given a trial of Reglan in the past which caused jitters, so this would not be an option)  For now continue Linzess 72 mcg once or twice weekly.  When we get samples of Trulance and Motegrity will try to offer her a trial.  Plan  Mima Cranmore S Akio Hudnall PA-C 12/06/2022   Cc: Terrilyn Saver, NP

## 2022-12-07 ENCOUNTER — Encounter: Payer: Self-pay | Admitting: General Practice

## 2022-12-10 NOTE — Progress Notes (Signed)
Addendum: Reviewed and agree with assessment and management plan. Nikan Ellingson M, MD  

## 2022-12-21 ENCOUNTER — Encounter: Payer: Self-pay | Admitting: *Deleted

## 2022-12-27 ENCOUNTER — Encounter (HOSPITAL_COMMUNITY)
Admission: RE | Admit: 2022-12-27 | Discharge: 2022-12-27 | Disposition: A | Discharge status: Home / Self Care | Payer: 59 | Source: Ambulatory Visit | Attending: Physician Assistant | Admitting: Physician Assistant

## 2022-12-27 DIAGNOSIS — K219 Gastro-esophageal reflux disease without esophagitis: Secondary | ICD-10-CM | POA: Insufficient documentation

## 2022-12-27 DIAGNOSIS — K5904 Chronic idiopathic constipation: Secondary | ICD-10-CM | POA: Insufficient documentation

## 2022-12-27 DIAGNOSIS — R1013 Epigastric pain: Secondary | ICD-10-CM | POA: Diagnosis present

## 2022-12-27 DIAGNOSIS — R1110 Vomiting, unspecified: Secondary | ICD-10-CM | POA: Insufficient documentation

## 2022-12-27 MED ORDER — TECHNETIUM TC 99M SULFUR COLLOID
2.1000 | Freq: Once | INTRAVENOUS | Status: AC
  Administered 2022-12-27: 2.1 via INTRAVENOUS

## 2023-01-01 ENCOUNTER — Encounter: Payer: Self-pay | Admitting: Internal Medicine

## 2023-01-01 ENCOUNTER — Ambulatory Visit (AMBULATORY_SURGERY_CENTER): Payer: 59 | Admitting: Internal Medicine

## 2023-01-01 VITALS — BP 147/73 | HR 65 | Temp 98.6°F | Resp 11 | Ht 65.0 in | Wt 190.0 lb

## 2023-01-01 DIAGNOSIS — K219 Gastro-esophageal reflux disease without esophagitis: Secondary | ICD-10-CM

## 2023-01-01 DIAGNOSIS — K2970 Gastritis, unspecified, without bleeding: Secondary | ICD-10-CM | POA: Diagnosis not present

## 2023-01-01 DIAGNOSIS — K5900 Constipation, unspecified: Secondary | ICD-10-CM

## 2023-01-01 DIAGNOSIS — R194 Change in bowel habit: Secondary | ICD-10-CM | POA: Diagnosis not present

## 2023-01-01 DIAGNOSIS — K5904 Chronic idiopathic constipation: Secondary | ICD-10-CM

## 2023-01-01 DIAGNOSIS — R198 Other specified symptoms and signs involving the digestive system and abdomen: Secondary | ICD-10-CM

## 2023-01-01 MED ORDER — SODIUM CHLORIDE 0.9 % IV SOLN: 500.0000 mL | Freq: Once | INTRAVENOUS | Status: DC

## 2023-01-01 MED ORDER — TRULANCE 3 MG PO TABS: 3.0000 mg | ORAL_TABLET | Freq: Every day | ORAL | 1 refills | Status: DC

## 2023-01-01 NOTE — Patient Instructions (Addendum)
Resume previous diet. Continue present medications. Await pathology results. Referral to Dr. Erie Noe at [facility] for consideration of redo hiatal hernia repair given symptoms despite medical therapy.  Dr. Vena Rua office nurse will set this up Trial of Trulance 3 mg daily (discontinue Linzess).  Repeat colonoscopy in 10 years for screening purposes     Please read over handouts about gastritis, hiatal hernias                                                                     YOU HAD AN ENDOSCOPIC PROCEDURE TODAY AT [place]:   Refer to the procedure report that was given to you for any specific questions about what was found during the examination.  If the procedure report does not answer your questions, please call your gastroenterologist to clarify.  If you requested that your care partner not be given the details of your procedure findings, then the procedure report has been included in a sealed envelope for you to review at your convenience later.  YOU SHOULD EXPECT: Some feelings of bloating in the abdomen. Passage of more gas than usual.  Walking can help get rid of the air that was put into your GI tract during the procedure and reduce the bloating. If you had a lower endoscopy (such as a colonoscopy or flexible sigmoidoscopy) you may notice spotting of blood in your stool or on the toilet paper. If you underwent a bowel prep for your procedure, you may not have a normal bowel movement for a few days.  Please Note:  You might notice some irritation and congestion in your nose or some drainage.  This is from the oxygen used during your procedure.  There is no need for concern and it should clear up in a day or so.  SYMPTOMS TO REPORT IMMEDIATELY:  Following lower endoscopy (colonoscopy or flexible sigmoidoscopy):  Excessive amounts of blood in the stool  Significant tenderness or worsening of abdominal pains  Swelling of the abdomen that is new, acute  Fever of 100F or  higher  Following upper endoscopy (EGD)  Vomiting of blood or coffee ground material  New chest pain or pain under the shoulder blades  Painful or persistently difficult swallowing  New shortness of breath  Fever of 100F or higher  Black, tarry-looking stools  For urgent or emergent issues, a gastroenterologist can be reached at any hour by calling [phone]. Do not use MyChart messaging for urgent concerns.    DIET:  We do recommend a small meal at first, but then you may proceed to your regular diet.  Drink plenty of fluids but you should avoid alcoholic beverages for 24 hours.  ACTIVITY:  You should plan to take it easy for the rest of today and you should NOT DRIVE or use heavy machinery until tomorrow (because of the sedation medicines used during the test).    FOLLOW UP: Our staff will call the number listed on your records the next business day following your procedure.  We will call around 7:15- 8:00 am to check on you and address any questions or concerns that you may have regarding the information given to you following your procedure. If we do not reach you, we will leave a  message.     If any biopsies were taken you will be contacted by phone or by letter within the next 1-3 weeks.  Please call us at [phone] if you have not heard about the biopsies in 3 weeks.    SIGNATURES/CONFIDENTIALITY: You and/or your care partner have signed paperwork which will be entered into your electronic medical record.  These signatures attest to the fact that that the information above on your After Visit Summary has been reviewed and is understood.  Full responsibility of the confidentiality of this discharge information lies with you and/or your care-partner.

## 2023-01-01 NOTE — Progress Notes (Signed)
Pt's states no medical or surgical changes since previsit or office visit. 

## 2023-01-01 NOTE — Op Note (Signed)
[place] Patient Name: Gina Wise Procedure Date: 01/01/2023 2:03 PM MRN: [id number] Endoscopist: Jerene Bears , MD, [id number] Age: 62 Referring MD:  Date of Birth: 05/28/1961 Gender: Female Account #: [id number] Procedure:                Upper GI endoscopy Indications:              Heartburn, Suspected gastro-esophageal reflux                            disease, history of paraesophageal hernia repair in                            summer 2023 now with recurrence and severe GERD                            symptoms despite BID Nexium Medicines:                Monitored Anesthesia Care Procedure:                Pre-Anesthesia Assessment:                           - Prior to the procedure, a History and Physical                            was performed, and patient medications and                            allergies were reviewed. The patient's tolerance of                            previous anesthesia was also reviewed. The risks                            and benefits of the procedure and the sedation                            options and risks were discussed with the patient.                            All questions were answered, and informed consent                            was obtained. Prior Anticoagulants: The patient has                            taken no anticoagulant or antiplatelet agents. ASA                            Grade Assessment: II - A patient with mild systemic                            disease. After reviewing the risks and benefits,  the patient was deemed in satisfactory condition to                            undergo the procedure.                           After obtaining informed consent, the endoscope was                            passed under direct vision. Throughout the                            procedure, the patient's blood pressure, pulse, and                            oxygen saturations were monitored  continuously. The                            Olympus Scope [id number] was introduced through the                            mouth, and advanced to the second part of duodenum.                            The upper GI endoscopy was accomplished without                            difficulty. The patient tolerated the procedure                            well. Scope In: Scope Out: Findings:                 Normal mucosa was found in the entire esophagus.                           A 5 cm recurrent hiatal hernia was found. The                            proximal extent of the gastric folds (end of                            tubular esophagus) was 37 cm from the incisors. The                            hiatal narrowing was 41-42 cm from the incisors.                            The hernia has a more paraesophageal appearance.                           The gastroesophageal flap valve was visualized  endoscopically and classified as Hill Grade IV (no                            fold, wide open lumen, hiatal hernia present).                           Patchy moderate inflammation characterized by                            adherent blood, congestion (edema), erythema and                            granularity was found in the gastric body and in                            the gastric antrum. Biopsies were taken with a cold                            forceps for histology and Helicobacter pylori                            testing.                           The examined duodenum was normal. Complications:            No immediate complications. Estimated Blood Loss:     Estimated blood loss was minimal. Impression:               - Normal mucosa was found in the entire esophagus.                           - Recurrent, 5 cm hiatal hernia, despite repair                            with [place] in 04/2022.                           - Gastritis. Biopsied.                           -  Normal examined duodenum. Recommendation:           - Patient has a contact number available for                            emergencies. The signs and symptoms of potential                            delayed complications were discussed with the                            patient. Return to normal activities tomorrow.                            Written discharge instructions were provided to the  patient.                           - Resume previous diet.                           - Continue present medications.                           - Await pathology results.                           - Referral to Dr. Erie Noe at [facility] for consideration                            of redo hiatal hernia repair given symptoms despite                            medical therapy. Jerene Bears, MD 01/01/2023 3:00:29 PM This report has been signed electronically.

## 2023-01-01 NOTE — Op Note (Signed)
[place] Patient Name: Gina Wise Procedure Date: 01/01/2023 2:03 PM MRN: [id number] Endoscopist: Jerene Bears , MD, [id number] Age: 62 Referring MD:  Date of Birth: 20-Feb-1961 Gender: Female Account #: [id number] Procedure:                Colonoscopy Indications:              Change in bowel habits, constipation Medicines:                Monitored Anesthesia Care Procedure:                Pre-Anesthesia Assessment:                           - Prior to the procedure, a History and Physical                            was performed, and patient medications and                            allergies were reviewed. The patient's tolerance of                            previous anesthesia was also reviewed. The risks                            and benefits of the procedure and the sedation                            options and risks were discussed with the patient.                            All questions were answered, and informed consent                            was obtained. Prior Anticoagulants: The patient has                            taken no anticoagulant or antiplatelet agents. ASA                            Grade Assessment: II - A patient with mild systemic                            disease. After reviewing the risks and benefits,                            the patient was deemed in satisfactory condition to                            undergo the procedure.                           After obtaining informed consent, the colonoscope  was passed under direct vision. Throughout the                            procedure, the patient's blood pressure, pulse, and                            oxygen saturations were monitored continuously. The                            Olympus [id number]) Colonoscope was                            introduced through the anus and advanced to the                            cecum, identified by  appendiceal orifice and                            ileocecal valve. The patient tolerated the                            procedure well. The colonoscopy was technically                            difficult and complex due to multiple diverticula                            in the colon and restricted mobility of the colon.                            Successful completion of the procedure was aided by                            lavage. The quality of the bowel preparation was                            good. The ileocecal valve, appendiceal orifice, and                            rectum were photographed. Scope In: 2:26:20 PM Scope Out: 2:43:32 PM Scope Withdrawal Time: 0 hours 10 minutes 10 seconds  Total Procedure Duration: 0 hours 17 minutes 12 seconds  Findings:                 The digital rectal exam was normal.                           Multiple large-mouthed and small-mouthed                            diverticula were found in the sigmoid colon,                            hepatic flexure and ascending colon. There was  sharp angulation of the sigmoid colon in                            association with the diverticular opening.                           The exam was otherwise without abnormality on                            direct and retroflexion views. Complications:            No immediate complications. Estimated Blood Loss:     Estimated blood loss: none. Impression:               - Moderate diverticulosis in the sigmoid colon, at                            the hepatic flexure and in the ascending colon.                            There was sharp angulation of the colon in                            association with the diverticular opening in the                            mid sigmoid. This likely in part explains                            constipation symptom.                           - The examination was otherwise normal on direct                             and retroflexion views.                           - No specimens collected. Recommendation:           - Patient has a contact number available for                            emergencies. The signs and symptoms of potential                            delayed complications were discussed with the                            patient. Return to normal activities tomorrow.                            Written discharge instructions were provided to the                            patient.                           -  Resume previous diet.                           - Continue present medications.                           - Trial of Trulance 3 mg daily (discontinue                            Linzess).                           - Repeat colonoscopy in 10 years for screening                            purposes (replace older recall with 10 yr date). Jerene Bears, MD 01/01/2023 3:05:17 PM This report has been signed electronically.

## 2023-01-01 NOTE — Progress Notes (Signed)
Per Dr. Hilarie Fredrickson, send in Trulance 3 mg po daily, #1 refills. Samples given of Trulance given

## 2023-01-01 NOTE — Progress Notes (Signed)
Called to room to assist during endoscopic procedure.  Patient ID and intended procedure confirmed with present staff. Received instructions for my participation in the procedure from the performing physician.  

## 2023-01-01 NOTE — Progress Notes (Signed)
See office note dated 12/06/2022 for details and current H&P  Patient presenting for upper and lower endoscopy to evaluate recurrent heartburn and GERD symptoms, imaging revealing apparent recurrent hiatal hernia as well as change in bowel habits and severe constipation  She remains appropriate for upper and lower endoscopy in the [facility] today.

## 2023-01-01 NOTE — Progress Notes (Signed)
Vss nad trans to pacu 

## 2023-01-02 ENCOUNTER — Encounter: Payer: Self-pay | Admitting: Internal Medicine

## 2023-01-02 ENCOUNTER — Telehealth: Payer: Self-pay | Admitting: *Deleted

## 2023-01-02 NOTE — Telephone Encounter (Signed)
  Follow up Call-     01/01/2023    1:27 PM 04/25/2022    9:08 AM  Call back number  Post procedure Call Back phone  # [phone]  Permission to leave phone message Yes Yes     Patient questions:  Do you have a fever, pain , or abdominal swelling? No. Pain Score  0 *  Have you tolerated food without any problems? Yes.    Have you been able to return to your normal activities? Yes.    Do you have any questions about your discharge instructions: Diet   No. Medications  No. Follow up visit  No.  Do you have questions or concerns about your Care? No.  Actions: * If pain score is 4 or above: No action needed, pain <4.

## 2023-01-07 ENCOUNTER — Encounter: Payer: Self-pay | Admitting: Internal Medicine

## 2023-01-09 ENCOUNTER — Encounter: Payer: Self-pay | Admitting: Internal Medicine

## 2023-01-10 NOTE — Telephone Encounter (Signed)
34 pages of referral, demographics, insurance and office notes faxed to Dr Deboraha Sprang office. Will await appointment time/date info.

## 2023-01-14 ENCOUNTER — Telehealth: Payer: Self-pay

## 2023-01-14 NOTE — Telephone Encounter (Signed)
PA request received via CMM for Trulance 3MG  tablets  PA has been submitted to [employer] and is pending determination.  Key: [id number]

## 2023-01-17 NOTE — Telephone Encounter (Signed)
PA has been DENIED due to:   Per your health plan's criteria, this drug is covered if you meet the following: There are paid claims or your doctor provides medical records (for example: chart notes) showing you have tried or cannot use at least one covered (formulary equivalent) drugs (if only one or only two covered drugs are available, you must have tried or cannot use all that are available). Covered drugs include: (I) Motegrity*.

## 2023-01-17 NOTE — Telephone Encounter (Signed)
Dr Hilarie Fredrickson, please advise...  Is motegrity appropriate for this patient? Insurance prefers this over trulance.

## 2023-01-19 NOTE — Telephone Encounter (Signed)
Dottie, Please let the patient know that rifaximin cannot be approved until she has tried dicyclomine.  This only needs to be tried for several days to determine if it is effective for her diarrhea related to IBS Please prescribe dicyclomine 20 mg 3 times daily She should let us know after 3 to 5 days if this is effective or not. If it is not rifaximin will likely be approved Thank you

## 2023-01-19 NOTE — Telephone Encounter (Signed)
We are trying Motegrity, see other notes JMP

## 2023-01-19 NOTE — Telephone Encounter (Signed)
The recent correspondence in this patient's chart dated 01/19/2023 at 13:56 should be deleted and is not intended for this patient.  What I was to say is that we can try Motegrity 2 mg daily Please let patient know that this is preferred over Trulance by her insurance provider. If she tries it and it is ineffective or causes diarrhea then Trulance would likely be approved after she has tried [employer]  Let me know if she has any questions or concerns

## 2023-01-21 ENCOUNTER — Telehealth: Payer: Self-pay

## 2023-01-21 MED ORDER — MOTEGRITY 2 MG PO TABS: 1.0000 | ORAL_TABLET | Freq: Every day | ORAL | 1 refills | Status: DC

## 2023-01-21 NOTE — Telephone Encounter (Signed)
Patient is scheduled to see Dr Erie Noe on 01/25/23.

## 2023-01-21 NOTE — Addendum Note (Signed)
Addended by: Larina Bras on: 01/21/2023 09:55 AM   Modules accepted: Orders

## 2023-01-21 NOTE — Telephone Encounter (Signed)
PA request received via CMM for Motegrity 2MG  tablets  Trulance previously denied due to no trial of Motegrity 2MG  tablets  PA has been submitted to [employer] and is pending determination  Key: [id number]

## 2023-01-21 NOTE — Telephone Encounter (Signed)
PA has been APPROVED from 01/21/2023-01/21/2024

## 2023-01-21 NOTE — Telephone Encounter (Signed)
I have spoken to patient to advise that insurance prefers she try motegrity prior to any approval being given for Trulance. Patient was already aware of this. She is advised to try the medication and let us know if ineffective or if she develops diarrhea. She verbalizes understanding of this information.

## 2023-01-22 ENCOUNTER — Ambulatory Visit: Payer: 59 | Admitting: Internal Medicine

## 2023-01-25 DIAGNOSIS — K219 Gastro-esophageal reflux disease without esophagitis: Secondary | ICD-10-CM | POA: Insufficient documentation

## 2023-03-01 ENCOUNTER — Telehealth: Payer: Self-pay

## 2023-03-01 NOTE — Transitions of Care (Post Inpatient/ED Visit) (Signed)
   03/01/2023  Name: Gina Wise MRN: [id number] DOB: [birth year]  Today's TOC FU Call Status: Today's TOC FU Call Status:: Unsuccessul Call (1st Attempt) Unsuccessful Call (1st Attempt) Date: 03/01/23  Attempted to reach the patient regarding the most recent Inpatient/ED visit.  Follow Up Plan: Additional outreach attempts will be made to reach the patient to complete the Transitions of Care (Post Inpatient/ED visit) call.   Signature Karena Addison, LPN [facility] Nurse Health Advisor Direct Dial [phone]

## 2023-03-01 NOTE — Transitions of Care (Post Inpatient/ED Visit) (Signed)
03/01/2023  Name: Gina Wise MRN: [id number] DOB: 06/20/1961  Today's TOC FU Call Status: Today's TOC FU Call Status:: Successful TOC FU Call Competed Unsuccessful Call (1st Attempt) Date: 03/01/23 [facility] FU Call Complete Date: 03/01/23  Transition Care Management Follow-up Telephone Call Date of Discharge: 02/28/23 Discharge Facility: Other ([facility]) Name of Other ([facility]) Discharge Facility: [facility] Type of Discharge: Inpatient Admission Primary Inpatient Discharge Diagnosis:: hernia How have you been since you were released from the hospital?: Better Any questions or concerns?: No  Items Reviewed: Did you receive and understand the discharge instructions provided?: Yes Medications obtained,verified, and reconciled?: Yes (Medications Reviewed) Any new allergies since your discharge?: No Dietary orders reviewed?: Yes Do you have support at home?: Yes People in Home: child(ren), adult  Medications Reviewed Today: Medications Reviewed Today     Reviewed by Karena Addison, LPN (Licensed Practical Nurse) on 03/01/23 at 1531  Med List Status: <None>   Medication Order Taking? Sig Documenting Provider Last Dose Status Informant  acetaminophen (TYLENOL) 500 MG tablet [id number] Yes Take 1,000 mg by mouth as needed. [provider] Taking Active   carboxymethylcellulose (REFRESH PLUS) 0.5 % SOLN [id number] Yes Place 1 drop into both eyes 3 (three) times daily as needed (dry/irritated eyes). [provider] Taking Active Self  cetirizine (ZYRTEC) 10 MG tablet [id number] Yes Take 10 mg by mouth daily. [provider] Taking Active Self  Chlorphen-Pseudoephed-APAP (TYLENOL ALLERGY SINUS PO) [id number] Yes Take 1-2 tablets by mouth daily as needed (sinus congestion). [provider] Taking Active Self  diphenhydrAMINE (BENADRYL) 25 MG tablet [id number] Yes Take 25 mg by mouth every 6 (six) hours as needed for allergies. [provider]  Taking Active Self  esomeprazole (NEXIUM) 40 MG capsule [id number] Yes TAKE 1 CAPSULE BY MOUTH TWICE DAILY Pyrtle, Carie Caddy, MD Taking Active Self  hydrochlorothiazide (HYDRODIURIL) 25 MG tablet [id number] Yes TAKE 1 TABLET(25 MG) BY MOUTH DAILY Clayborne Dana, NP Taking Active   levothyroxine (SYNTHROID) 125 MCG tablet [id number] Yes TAKE 1 TABLET(125 MCG) BY MOUTH DAILY Clayborne Dana, NP Taking Active Self           Med Note Alphonzo Dublin   Fri May 11, 2022  9:12 AM) Pt takes 125 mcg with 25 mcg for a daily dose of 150 mcg  levothyroxine (SYNTHROID) 25 MCG tablet [id number] Yes Take 1 tablet (25 mcg total) by mouth daily. Take in addition to your 125 mg tablet to total 150 mg Clayborne Dana, NP Taking Active Self  ondansetron (ZOFRAN) 8 MG tablet [id number] Yes Take 1 tablet (8 mg total) by mouth every 8 (eight) hours as needed for nausea or vomiting. Clayborne Dana, NP Taking Active   Plecanatide (TRULANCE) 3 MG TABS [id number] Yes Take 1 tablet (3 mg total) by mouth daily. Beverley Fiedler, MD Taking Active   Prucalopride Succinate (MOTEGRITY) 2 MG TABS [id number] Yes Take 1 tablet (2 mg total) by mouth daily. Pharmacy-please d/c rx for trulance. Insurance will not cover. Beverley Fiedler, MD Taking Active   sertraline (ZOLOFT) 100 MG tablet [id number] Yes Take 0.5 tablets (50 mg total) by mouth daily. Take 1/2 (one-half) tablet by mouth once daily Clayborne Dana, NP Taking Active Self  SUMAtriptan (IMITREX) 100 MG tablet [id number] Yes May repeat in 2 hours if headache persists(Plz sched with new provider) Clayborne Dana, NP Taking Active             Home Care and Equipment/Supplies: Were  Home Health Services Ordered?: NA Any new equipment or medical supplies ordered?: NA  Functional Questionnaire: Do you need assistance with meal preparation?: No Do you need assistance with eating?: No Do you have difficulty maintaining continence: No Do you need assistance with getting out of bed/getting out of  a chair/moving?: No Do you have difficulty managing or taking your medications?: No  Follow up appointments reviewed: PCP Follow-up appointment confirmed?: NA Specialist Hospital Follow-up appointment confirmed?: Yes Date of Specialist follow-up appointment?: 03/15/23 Follow-Up Specialty Provider:: [facility] surgeon Do you need transportation to your follow-up appointment?: No Do you understand care options if your condition(s) worsen?: Yes-patient verbalized understanding    SIGNATURE Karena Addison, LPN [facility] Nurse Health Advisor Direct Dial [phone]

## 2023-04-02 ENCOUNTER — Encounter: Payer: Self-pay | Admitting: Family Medicine

## 2023-04-02 ENCOUNTER — Encounter: Payer: Self-pay | Admitting: Internal Medicine

## 2023-04-22 ENCOUNTER — Other Ambulatory Visit: Payer: Self-pay | Admitting: Family Medicine

## 2023-04-22 DIAGNOSIS — R112 Nausea with vomiting, unspecified: Secondary | ICD-10-CM

## 2023-04-23 MED ORDER — ONDANSETRON HCL 8 MG PO TABS
8.0000 mg | ORAL_TABLET | Freq: Three times a day (TID) | ORAL | 0 refills | Status: DC | PRN
Start: 2023-04-23 — End: 2023-08-26

## 2023-06-13 ENCOUNTER — Telehealth (HOSPITAL_BASED_OUTPATIENT_CLINIC_OR_DEPARTMENT_OTHER): Payer: Self-pay

## 2023-06-14 ENCOUNTER — Encounter (HOSPITAL_BASED_OUTPATIENT_CLINIC_OR_DEPARTMENT_OTHER): Payer: Self-pay | Admitting: Obstetrics & Gynecology

## 2023-06-14 ENCOUNTER — Telehealth (HOSPITAL_BASED_OUTPATIENT_CLINIC_OR_DEPARTMENT_OTHER): Payer: Self-pay

## 2023-06-21 ENCOUNTER — Encounter: Payer: Self-pay | Admitting: Family Medicine

## 2023-06-21 ENCOUNTER — Ambulatory Visit: Payer: 59 | Admitting: Family Medicine

## 2023-06-21 VITALS — BP 143/81 | HR 90 | Ht 65.0 in | Wt 198.0 lb

## 2023-06-21 DIAGNOSIS — E039 Hypothyroidism, unspecified: Secondary | ICD-10-CM | POA: Diagnosis not present

## 2023-06-21 DIAGNOSIS — R6889 Other general symptoms and signs: Secondary | ICD-10-CM

## 2023-06-21 DIAGNOSIS — E782 Mixed hyperlipidemia: Secondary | ICD-10-CM

## 2023-06-21 DIAGNOSIS — E669 Obesity, unspecified: Secondary | ICD-10-CM

## 2023-06-21 DIAGNOSIS — K219 Gastro-esophageal reflux disease without esophagitis: Secondary | ICD-10-CM

## 2023-06-21 DIAGNOSIS — Z6832 Body mass index (BMI) 32.0-32.9, adult: Secondary | ICD-10-CM

## 2023-06-21 DIAGNOSIS — I10 Essential (primary) hypertension: Secondary | ICD-10-CM | POA: Diagnosis not present

## 2023-06-21 MED ORDER — ESOMEPRAZOLE MAGNESIUM 40 MG PO CPDR
40.0000 mg | DELAYED_RELEASE_CAPSULE | Freq: Two times a day (BID) | ORAL | 0 refills | Status: DC
Start: 2023-06-21 — End: 2023-09-10

## 2023-06-21 NOTE — Progress Notes (Signed)
Established Patient Office Visit  Subjective   Patient ID: Gina Wise, female    DOB: 06/08/1961  Age: 62 y.o. MRN: [id number]  Chief Complaint  Patient presents with   Medical Management of Chronic Issues    HPI   Discussed the use of AI scribe software for clinical note transcription with the patient, who gave verbal consent to proceed.  History of Present Illness   The patient, with a history of recent surgery this summer, reports feeling well post-operatively with improved oxygen levels. However, they express concern about increased appetite and subsequent weight gain, which they believe may be contributing to worsening cholesterol levels. They have a family history of heart disease, including two recent heart attacks in a younger sibling, which further heightens their concern.  The patient also mentions a palpable knot in the right anterior neck, noticed a year ago. They report no associated pain or changes in saliva production, but note increased discomfort when dehydrated.    They are currently on Synthroid 150 mcg for thyroid management. No new concerns other than weight gradually increasing.   In addition to these concerns, the patient reports waking up in the middle of the night choking on acid, despite being on ezomeprazole - following up with GI next month. They express a desire to lose weight, hoping it will alleviate some of their symptoms. They have been walking their dogs multiple times a day, estimating about an hour of walking daily.  The patient has previously tried [place] for weight loss, which initially worked but ceased to be effective after their first hernia surgery. They express interest in trying Zepbound or Wegovy, if covered by their insurance. They are aware of the need for lifestyle modifications in conjunction with medication for effective weight management.  Lastly, the patient mentions an increase in work hours, which they believe may be contributing  to their weight gain and elevated blood pressure. They report a weight gain of eight pounds since March, following a loss of fifteen pounds after surgery.        Wt Readings from Last 3 Encounters:  06/21/23 198 lb (89.8 kg)  01/01/23 190 lb (86.2 kg)  12/06/22 190 lb 2 oz (86.2 kg)            ROS All review of systems negative except what is listed in the HPI    Objective:     BP (!) 143/81   Pulse 90   Ht 5\' 5"  (1.651 m)   Wt 198 lb (89.8 kg)   LMP 04/03/2012 Comment: Still has Cervix and Ovaries  SpO2 99%   BMI 32.95 kg/m    Physical Exam Vitals reviewed.  Constitutional:      Appearance: Normal appearance.  Neck:     Thyroid: No thyromegaly or thyroid tenderness.   Cardiovascular:     Rate and Rhythm: Normal rate and regular rhythm.     Heart sounds: Normal heart sounds.  Pulmonary:     Effort: Pulmonary effort is normal.     Breath sounds: Normal breath sounds.  Musculoskeletal:     Cervical back: Normal range of motion and neck supple.  Lymphadenopathy:     Cervical: No cervical adenopathy.  Skin:    General: Skin is warm and dry.  Neurological:     Mental Status: She is alert and oriented to person, place, and time.  Psychiatric:        Mood and Affect: Mood normal.  Behavior: Behavior normal.        Thought Content: Thought content normal.        Judgment: Judgment normal.      No results found for any visits on 06/21/23.    The 10-year ASCVD risk score (Arnett DK, et al., 2019) is: 8.8%    Assessment & Plan:   Problem List Items Addressed This Visit       Active Problems   Hypothyroidism    Continue current synthroid for now Repeat TSH      Relevant Orders   TSH   Essential hypertension, benign    Blood pressure slightly elevated at 143/81 -Schedule a nurse visit in 2 weeks for a blood pressure check.      Obesity    Patient reports increased appetite and weight gain since surgery. Currently walking dogs for  about an hour total each day -Not interested in [facility] right now -Once recent lab results are received, consider starting Zepbound  -Discussed significance of focusing on healthy diet, portion control, and regular exercise      GERD (gastroesophageal reflux disease) - Primary    Refill Nexium Keep upcoming follow-up with GI  Lifestyle measures for reflux: - Avoid meals or carbonated beverages within 3 hours of bedtime - Minimize intake of fried, fatty, and spicy foods (this will help decrease gastric acid production) - Raise the head of the bed using 4-6 inch blocks (especially if symptoms are present at night) - Maintain a healthy weight and avoid tight fitting clothes, especially around the waist  - Avoid foods that relax the sphincter or worsen symptoms (chocolate, peppermint, fatty foods, citrus, spicy foods, tomatoes, coffee, caffeine)  - Minimize use of NSAIDs (ibuprofen, Aleve, etc), nicotine, and alcohol       Relevant Medications   esomeprazole (NEXIUM) 40 MG capsule   Hyperlipidemia    Family history of heart disease. Last cholesterol check in December. Patient not currently on any cholesterol-lowering medication. Recent labs done at work, results pending. She will send [place] a copy -Await recent lab results. If cholesterol remains elevated, consider starting treatment      Other Visit Diagnoses     Abnormal neck finding     Likely small salivary gland stone. She declined imaging today. Discussed symptoms to monitor for and supportive measures. Follow-up if not improving or if symptoms worsen.        Return in about 2 weeks (around 07/05/2023) for BP check with nurse.    Clayborne Dana, NP

## 2023-06-21 NOTE — Assessment & Plan Note (Signed)
Refill Nexium Keep upcoming follow-up with GI  Lifestyle measures for reflux: - Avoid meals or carbonated beverages within 3 hours of bedtime - Minimize intake of fried, fatty, and spicy foods (this will help decrease gastric acid production) - Raise the head of the bed using 4-6 inch blocks (especially if symptoms are present at night) - Maintain a healthy weight and avoid tight fitting clothes, especially around the waist  - Avoid foods that relax the sphincter or worsen symptoms (chocolate, peppermint, fatty foods, citrus, spicy foods, tomatoes, coffee, caffeine)  - Minimize use of NSAIDs (ibuprofen, Aleve, etc), nicotine, and alcohol

## 2023-06-21 NOTE — Assessment & Plan Note (Signed)
Family history of heart disease. Last cholesterol check in December. Patient not currently on any cholesterol-lowering medication. Recent labs done at work, results pending. She will send [place] a copy -Await recent lab results. If cholesterol remains elevated, consider starting treatment

## 2023-06-21 NOTE — Assessment & Plan Note (Signed)
Blood pressure slightly elevated at 143/81 -Schedule a nurse visit in 2 weeks for a blood pressure check.

## 2023-06-21 NOTE — Assessment & Plan Note (Signed)
Continue current synthroid for now Repeat TSH

## 2023-06-21 NOTE — Assessment & Plan Note (Signed)
Patient reports increased appetite and weight gain since surgery. Currently walking dogs for about an hour total each day -Not interested in [facility] right now -Once recent lab results are received, consider starting Zepbound  -Discussed significance of focusing on healthy diet, portion control, and regular exercise

## 2023-06-22 LAB — TSH: TSH: 15.1 u[IU]/mL — ABNORMAL HIGH (ref 0.450–4.500)

## 2023-06-23 ENCOUNTER — Encounter: Payer: Self-pay | Admitting: Family Medicine

## 2023-06-23 DIAGNOSIS — E669 Obesity, unspecified: Secondary | ICD-10-CM

## 2023-06-23 DIAGNOSIS — E782 Mixed hyperlipidemia: Secondary | ICD-10-CM

## 2023-06-23 DIAGNOSIS — E039 Hypothyroidism, unspecified: Secondary | ICD-10-CM

## 2023-06-25 ENCOUNTER — Other Ambulatory Visit (HOSPITAL_BASED_OUTPATIENT_CLINIC_OR_DEPARTMENT_OTHER): Payer: Self-pay

## 2023-06-25 ENCOUNTER — Other Ambulatory Visit: Payer: Self-pay | Admitting: Family Medicine

## 2023-06-25 ENCOUNTER — Encounter (HOSPITAL_BASED_OUTPATIENT_CLINIC_OR_DEPARTMENT_OTHER): Payer: Self-pay

## 2023-06-25 ENCOUNTER — Other Ambulatory Visit: Payer: Self-pay

## 2023-06-25 DIAGNOSIS — K219 Gastro-esophageal reflux disease without esophagitis: Secondary | ICD-10-CM

## 2023-06-25 MED ORDER — LEVOTHYROXINE SODIUM 175 MCG PO TABS
175.0000 ug | ORAL_TABLET | Freq: Every day | ORAL | 3 refills | Status: DC
Start: 2023-06-25 — End: 2023-08-13

## 2023-06-25 MED ORDER — ZEPBOUND 2.5 MG/0.5ML ~~LOC~~ SOAJ
2.5000 mg | SUBCUTANEOUS | 1 refills | Status: DC
Start: 2023-06-25 — End: 2023-08-14
  Filled 2023-06-25 – 2023-07-22 (×4): qty 2, 28d supply, fill #0

## 2023-06-25 MED ORDER — ROSUVASTATIN CALCIUM 20 MG PO TABS
20.0000 mg | ORAL_TABLET | Freq: Every day | ORAL | 1 refills | Status: DC
Start: 2023-06-25 — End: 2023-09-25

## 2023-06-26 ENCOUNTER — Other Ambulatory Visit (HOSPITAL_BASED_OUTPATIENT_CLINIC_OR_DEPARTMENT_OTHER): Payer: Self-pay

## 2023-06-27 ENCOUNTER — Other Ambulatory Visit: Payer: Self-pay

## 2023-06-27 ENCOUNTER — Other Ambulatory Visit (HOSPITAL_BASED_OUTPATIENT_CLINIC_OR_DEPARTMENT_OTHER): Payer: Self-pay

## 2023-06-28 ENCOUNTER — Encounter (HOSPITAL_BASED_OUTPATIENT_CLINIC_OR_DEPARTMENT_OTHER): Payer: Self-pay

## 2023-06-28 ENCOUNTER — Other Ambulatory Visit (HOSPITAL_BASED_OUTPATIENT_CLINIC_OR_DEPARTMENT_OTHER): Payer: Self-pay

## 2023-07-01 ENCOUNTER — Other Ambulatory Visit (HOSPITAL_BASED_OUTPATIENT_CLINIC_OR_DEPARTMENT_OTHER): Payer: Self-pay

## 2023-07-01 ENCOUNTER — Telehealth: Payer: Self-pay

## 2023-07-01 NOTE — Telephone Encounter (Signed)
Pharmacy Patient Advocate Encounter   Received notification from Patient Advice Request messages that prior authorization for Zepbound 2.5MG /0.5ML pen-injectors is required/requested.   Insurance verification completed.   The patient is insured through [facility] .   Per test claim: PA required; PA submitted to [facility] via CoverMyMeds Key/confirmation #/EOC [id number] Status is pending

## 2023-07-02 ENCOUNTER — Other Ambulatory Visit (HOSPITAL_BASED_OUTPATIENT_CLINIC_OR_DEPARTMENT_OTHER): Payer: Self-pay

## 2023-07-03 ENCOUNTER — Other Ambulatory Visit (HOSPITAL_COMMUNITY): Payer: Self-pay

## 2023-07-03 NOTE — Telephone Encounter (Signed)
Pharmacy Patient Advocate Encounter  Received notification from [facility] that Prior Authorization for Zepbound 2.5MG /0.5ML pen-injectors has been APPROVED from 07/01/23 to 12/29/23   PA #/Case ID/Reference #: [id number]

## 2023-07-05 ENCOUNTER — Telehealth: Payer: Self-pay

## 2023-07-05 NOTE — Transitions of Care (Post Inpatient/ED Visit) (Signed)
07/05/2023  Name: Gina Wise MRN: [id number] DOB: 05-Jun-1961  Today's TOC FU Call Status: Today's TOC FU Call Status:: Successful TOC FU Call Completed TOC FU Call Complete Date: 07/05/23 Patient's Name and Date of Birth confirmed.  Transition Care Management Follow-up Telephone Call Date of Discharge: 07/04/23 Discharge Facility: [facility] Type of Discharge: Inpatient Admission Primary Inpatient Discharge Diagnosis:: COVID How have you been since you were released from the hospital?: Better Any questions or concerns?: No  Items Reviewed: Did you receive and understand the discharge instructions provided?: Yes Medications obtained,verified, and reconciled?: Yes (Medications Reviewed) Any new allergies since your discharge?: No Dietary orders reviewed?: Yes Do you have support at home?: Yes People in Home: spouse  Medications Reviewed Today: Medications Reviewed Today     Reviewed by Karena Addison, LPN (Licensed Practical Nurse) on 07/05/23 at 0932  Med List Status: <None>   Medication Order Taking? Sig Documenting Provider Last Dose Status Informant  acetaminophen (TYLENOL) 500 MG tablet [id number] No Take 1,000 mg by mouth as needed. [provider] Taking Active   carboxymethylcellulose (REFRESH PLUS) 0.5 % SOLN [id number] No Place 1 drop into both eyes 3 (three) times daily as needed (dry/irritated eyes). [provider] Taking Active Self  cetirizine (ZYRTEC) 10 MG tablet [id number] No Take 10 mg by mouth daily. [provider] Taking Active Self  Chlorphen-Pseudoephed-APAP (TYLENOL ALLERGY SINUS PO) [id number] No Take 1-2 tablets by mouth daily as needed (sinus congestion). [provider] Taking Active Self  diphenhydrAMINE (BENADRYL) 25 MG tablet [id number] No Take 25 mg by mouth every 6 (six) hours as needed for allergies. [provider] Taking Active Self  esomeprazole (NEXIUM) 40 MG capsule [id number]  Take 1  capsule (40 mg total) by mouth 2 (two) times daily. Clayborne Dana, NP  Active   hydrochlorothiazide (HYDRODIURIL) 25 MG tablet [id number] No TAKE 1 TABLET(25 MG) BY MOUTH DAILY Clayborne Dana, NP Taking Active   levothyroxine (SYNTHROID) 175 MCG tablet [id number]  Take 1 tablet (175 mcg total) by mouth daily. Clayborne Dana, NP  Active   ondansetron (ZOFRAN) 8 MG tablet [id number] No Take 1 tablet (8 mg total) by mouth every 8 (eight) hours as needed for nausea or vomiting. Clayborne Dana, NP Taking Active   rosuvastatin (CRESTOR) 20 MG tablet [id number]  Take 1 tablet (20 mg total) by mouth daily. Clayborne Dana, NP  Active   sertraline (ZOLOFT) 100 MG tablet [id number] No TAKE 1/2 TABLET(50 MG) BY MOUTH EVERY DAY Clayborne Dana, NP Taking Active   SUMAtriptan (IMITREX) 100 MG tablet [id number] No May repeat in 2 hours if headache persists(Plz sched with new provider) Clayborne Dana, NP Taking Active   tirzepatide [facility]) 2.5 MG/0.5ML Pen [id number]  Inject 2.5 mg into the skin once a week. Clayborne Dana, NP  Active             Home Care and Equipment/Supplies: Were Home Health Services Ordered?: NA Any new equipment or medical supplies ordered?: NA  Functional Questionnaire: Do you need assistance with bathing/showering or dressing?: No Do you need assistance with meal preparation?: No Do you need assistance with eating?: No Do you have difficulty maintaining continence: No Do you need assistance with getting out of bed/getting out of a chair/moving?: No Do you have difficulty managing or taking your medications?: No  Follow up appointments reviewed: PCP Follow-up appointment confirmed?: Yes Date of PCP follow-up appointment?: 07/09/23 Follow-up Provider: [facility] Follow-up  appointment confirmed?: NA Do you need transportation to your follow-up appointment?: No Do you understand care options if your condition(s) worsen?: Yes-patient verbalized  understanding    SIGNATURE Karena Addison, LPN [facility] Nurse Health Advisor Direct Dial [phone]

## 2023-07-09 ENCOUNTER — Ambulatory Visit (INDEPENDENT_AMBULATORY_CARE_PROVIDER_SITE_OTHER): Payer: 59 | Admitting: Family Medicine

## 2023-07-09 ENCOUNTER — Other Ambulatory Visit (HOSPITAL_BASED_OUTPATIENT_CLINIC_OR_DEPARTMENT_OTHER): Payer: Self-pay

## 2023-07-09 ENCOUNTER — Encounter: Payer: Self-pay | Admitting: Family Medicine

## 2023-07-09 VITALS — BP 127/58 | HR 91 | Temp 98.0°F | Resp 16 | Ht 65.0 in | Wt 190.2 lb

## 2023-07-09 DIAGNOSIS — E878 Other disorders of electrolyte and fluid balance, not elsewhere classified: Secondary | ICD-10-CM

## 2023-07-09 DIAGNOSIS — Z09 Encounter for follow-up examination after completed treatment for conditions other than malignant neoplasm: Secondary | ICD-10-CM

## 2023-07-09 DIAGNOSIS — U071 COVID-19: Secondary | ICD-10-CM | POA: Diagnosis not present

## 2023-07-09 LAB — MAGNESIUM: Magnesium: 1.9 mg/dL (ref 1.5–2.5)

## 2023-07-09 LAB — COMPREHENSIVE METABOLIC PANEL
ALT: 13 U/L (ref 0–35)
AST: 16 U/L (ref 0–37)
Albumin: 4.4 g/dL (ref 3.5–5.2)
Alkaline Phosphatase: 56 U/L (ref 39–117)
BUN: 13 mg/dL (ref 6–23)
CO2: 27 meq/L (ref 19–32)
Calcium: 9.4 mg/dL (ref 8.4–10.5)
Chloride: 100 meq/L (ref 96–112)
Creatinine, Ser: 0.87 mg/dL (ref 0.40–1.20)
GFR: 71.94 mL/min (ref >60.00)
Glucose, Bld: 111 mg/dL — ABNORMAL HIGH (ref 70–99)
Potassium: 3.6 meq/L (ref 3.5–5.1)
Sodium: 138 meq/L (ref 135–145)
Total Bilirubin: 0.8 mg/dL (ref 0.2–1.2)
Total Protein: 7.8 g/dL (ref 6.0–8.3)

## 2023-07-09 LAB — CBC WITH DIFFERENTIAL/PLATELET
Basophils Absolute: 0.1 10*3/uL (ref 0.0–0.1)
Basophils Relative: 0.6 % (ref 0.0–3.0)
Eosinophils Absolute: 0.3 10*3/uL (ref 0.0–0.7)
Eosinophils Relative: 3.4 % (ref 0.0–5.0)
HCT: 41.7 % (ref 36.0–46.0)
Hemoglobin: 13.9 g/dL (ref 12.0–15.0)
Lymphocytes Relative: 30.3 % (ref 12.0–46.0)
Lymphs Abs: 2.8 10*3/uL (ref 0.7–4.0)
MCHC: 33.2 g/dL (ref 30.0–36.0)
MCV: 85.5 fl (ref 78.0–100.0)
Monocytes Absolute: 0.9 10*3/uL (ref 0.1–1.0)
Monocytes Relative: 9.4 % (ref 3.0–12.0)
Neutro Abs: 5.2 10*3/uL (ref 1.4–7.7)
Neutrophils Relative %: 56.3 % (ref 43.0–77.0)
Platelets: 405 10*3/uL — ABNORMAL HIGH (ref 150.0–400.0)
RBC: 4.88 Mil/uL (ref 3.87–5.11)
RDW: 14.3 % (ref 11.5–15.5)
WBC: 9.2 10*3/uL (ref 4.0–10.5)

## 2023-07-09 MED ORDER — BENZONATATE 200 MG PO CAPS
200.0000 mg | ORAL_CAPSULE | Freq: Two times a day (BID) | ORAL | 0 refills | Status: DC | PRN
Start: 2023-07-09 — End: 2023-07-15

## 2023-07-09 MED ORDER — GUAIFENESIN ER 600 MG PO TB12
1200.0000 mg | ORAL_TABLET | Freq: Two times a day (BID) | ORAL | 0 refills | Status: DC
Start: 2023-07-09 — End: 2023-09-25

## 2023-07-09 MED ORDER — HYDROCOD POLI-CHLORPHE POLI ER 10-8 MG/5ML PO SUER
5.0000 mL | Freq: Two times a day (BID) | ORAL | 0 refills | Status: AC | PRN
Start: 2023-07-09 — End: 2023-07-14

## 2023-07-09 NOTE — Progress Notes (Signed)
Acute Office Visit  Subjective:     Patient ID: Gina Wise, female    DOB: [birth year], 62 y.o.   MRN: [id number]  Chief Complaint  Patient presents with   Follow-up    Follow up    HPI Patient is in today for hospital follow-up.  Patient was recently at [facility] from September 9 through July 04, 2019 for for nausea vomiting diarrhea, COVID, electrolyte abnormalities.  They suspect she contracted COVID from her son initially.  During admission she was treated with IV fluids and antiemetics stool was tested for C. difficile which was negative.  By the time of discharge her symptoms had resolved.  She did have to have magnesium and potassium replacement while hospitalized.    Discussed the use of AI scribe software for clinical note transcription with the patient, who gave verbal consent to proceed.  History of Present Illness   The patient, recently discharged from [facility] following a COVID-19 diagnosis, presents with persistent symptoms. They were admitted primarily due to severe nausea, vomiting, and diarrhea, which were so intense that they could not breathe without vomiting. Despite receiving fluids and potassium during their hospital stay, the patient continues to experience these symptoms, but not as severe.  The patient reports some improvement in their headache, but they are still coughing and expectorating. They also note that they become easily winded, even after minimal exertion such as walking across a room, which is accompanied by sweating, shaking, and near syncope. Denies chest pain, fevers, wheezing.  The patient is not currently taking any prescribed medications for their symptoms, but they have been self-administering Benadryl and Zofran, the latter of which they report has been somewhat effective in managing their nausea. They also report difficulty sleeping, which they attribute to their persistent cough.  The patient's COVID-19  symptoms have also affected their taste, with certain foods and drinks tasting like the smell of hairspray. Despite their lack of appetite, they have been attempting to eat a little bit of gentle foods. They have been staying hydrated by drinking water.  The patient's diarrhea is likely related to their COVID-19 infection, as their stool tested negative for C. Diff.   The patient is currently on short-term disability due to their illness and is hoping to return to work next Monday, although they are uncertain if they will be well enough to do so.          ROS All review of systems negative except what is listed in the HPI      Objective:    BP (!) 127/58   Pulse 91   Temp 98 F (36.7 C) (Oral)   Resp 16   Ht 5\' 5"  (1.651 m)   Wt 190 lb 3.2 oz (86.3 kg)   LMP 04/03/2012 Comment: Still has Cervix and Ovaries  SpO2 99%   BMI 31.65 kg/m    Physical Exam Vitals reviewed.  Constitutional:      Appearance: Normal appearance.  Cardiovascular:     Rate and Rhythm: Normal rate and regular rhythm.     Heart sounds: Normal heart sounds.  Pulmonary:     Effort: Pulmonary effort is normal.     Breath sounds: Normal breath sounds.  Skin:    General: Skin is warm and dry.  Neurological:     Mental Status: She is alert and oriented to person, place, and time.  Psychiatric:        Mood and Affect: Mood normal.  Behavior: Behavior normal.        Thought Content: Thought content normal.        Judgment: Judgment normal.     No results found for any visits on 07/09/23.      Assessment & Plan:   Problem List Items Addressed This Visit       Active Problems   COVID-19 - Primary   Relevant Medications   guaiFENesin (MUCINEX) 600 MG 12 hr tablet   chlorpheniramine-HYDROcodone (TUSSIONEX) 10-8 MG/5ML   benzonatate (TESSALON) 200 MG capsule   Other Relevant Orders   CBC with Differential/Platelet   Comprehensive metabolic panel   Magnesium   Other Visit Diagnoses      Hospital discharge follow-up       Relevant Orders   CBC with Differential/Platelet   Comprehensive metabolic panel   Magnesium   Electrolyte abnormality       Relevant Orders   Comprehensive metabolic panel   Magnesium         COVID-19 Recent hospitalization for COVID-19 with persistent symptoms including nausea, vomiting, diarrhea, cough, and fatigue. Noted improvement in headache. No difficulty breathing, but reports easy windedness. Negative C. diff test in the hospital. -Continue Zofran as needed for nausea. -Start Mucinex to help clear respiratory secretions. -Start Tessalon Perles for cough. PRN cough syrup at bedtime added.  -Check labs today to assess electrolyte status.  -Encourage hydration and gentle diet.  Work Status Currently on short-term disability or FMLA due to COVID-19 illness. -Complete and submit disability paperwork. -Plan for potential return to work on 07/15/2023, pending symptom improvement.        Meds ordered this encounter  Medications   guaiFENesin (MUCINEX) 600 MG 12 hr tablet    Sig: Take 2 tablets (1,200 mg total) by mouth 2 (two) times daily.    Dispense:  30 tablet    Refill:  0    Order Specific Question:   Supervising Provider    Answer:   Danise Edge A [[id number]]   chlorpheniramine-HYDROcodone (TUSSIONEX) 10-8 MG/5ML    Sig: Take 5 mLs by mouth every 12 (twelve) hours as needed for up to 5 days.    Dispense:  50 mL    Refill:  0    Order Specific Question:   Supervising Provider    Answer:   Danise Edge A [[id number]]   benzonatate (TESSALON) 200 MG capsule    Sig: Take 1 capsule (200 mg total) by mouth 2 (two) times daily as needed for cough.    Dispense:  20 capsule    Refill:  0    Order Specific Question:   Supervising Provider    Answer:   Danise Edge A [[id number]]      Return if symptoms worsen or fail to improve.  Clayborne Dana, NP

## 2023-07-10 ENCOUNTER — Telehealth: Payer: Self-pay | Admitting: Neurology

## 2023-07-10 ENCOUNTER — Ambulatory Visit: Payer: 59

## 2023-07-10 ENCOUNTER — Ambulatory Visit: Payer: 59 | Admitting: Obstetrics & Gynecology

## 2023-07-10 NOTE — Telephone Encounter (Signed)
Forms completed, copy sent to scan, copy to hold. Faxed to [phone] with confirmation received.

## 2023-07-11 ENCOUNTER — Encounter: Payer: Self-pay | Admitting: Family Medicine

## 2023-07-15 ENCOUNTER — Ambulatory Visit (INDEPENDENT_AMBULATORY_CARE_PROVIDER_SITE_OTHER): Payer: 59 | Admitting: Family Medicine

## 2023-07-15 ENCOUNTER — Encounter: Payer: Self-pay | Admitting: Family Medicine

## 2023-07-15 VITALS — BP 117/67 | HR 98 | Temp 97.6°F | Ht 65.0 in | Wt 189.0 lb

## 2023-07-15 DIAGNOSIS — R112 Nausea with vomiting, unspecified: Secondary | ICD-10-CM | POA: Diagnosis not present

## 2023-07-15 MED ORDER — PROMETHAZINE HCL 12.5 MG PO TABS
12.5000 mg | ORAL_TABLET | Freq: Three times a day (TID) | ORAL | 0 refills | Status: DC | PRN
Start: 2023-07-15 — End: 2023-09-10

## 2023-07-15 NOTE — Progress Notes (Signed)
Acute Office Visit  Subjective:     Patient ID: Gina Wise, female    DOB: 25-Jan-1961, 62 y.o.   MRN: [id number]  Chief Complaint  Patient presents with   Weakness   Fatigue     Patient is in today for post-COVID fatigue, nausea.    Discussed the use of AI scribe software for clinical note transcription with the patient, who gave verbal consent to proceed.  History of Present Illness   The patient, with a history of recent COVID-19 infection, presents with ongoing weakness, fatigue, nausea, shakiness, and sweating. They report slow but steady improvement each day. They also experience sporadic diarrhea, alternating with periods of constipation. They have been trying to stay hydrated and have not been taking their blood pressure medication due to a lower than baseline blood pressure. Despite feeling unwell, they have been attempting to eat, but often vomit after meals. They have been taking Zofran for nausea, but it does not seem to be very effective. They also report occasional cough, which they believe is due to sinus drainage. She feels winded with exertion and easily fatigued. They deny any recent fevers. They have been struggling with sleep, often waking up after only an hour. They also report feeling mentally foggy and are concerned about returning to work under these conditions.            All review of systems negative except what is listed in the HPI      Objective:    BP 117/67   Pulse 98   Temp 97.6 F (36.4 C) (Oral)   Ht 5\' 5"  (1.651 m)   Wt 189 lb (85.7 kg)   LMP 04/03/2012 Comment: Still has Cervix and Ovaries  SpO2 100%   BMI 31.45 kg/m     Physical Exam Vitals reviewed.  Constitutional:      General: She is not in acute distress.    Appearance: Normal appearance.  Cardiovascular:     Rate and Rhythm: Normal rate and regular rhythm.     Heart sounds: Normal heart sounds.  Pulmonary:     Effort: Pulmonary effort is normal.     Breath sounds:  Normal breath sounds. No wheezing, rhonchi or rales.  Abdominal:     General: Abdomen is flat. Bowel sounds are normal. There is no distension.     Palpations: Abdomen is soft. There is no mass.     Tenderness: There is no abdominal tenderness. There is no guarding or rebound.  Skin:    General: Skin is warm and dry.  Neurological:     Mental Status: She is alert and oriented to person, place, and time.  Psychiatric:        Mood and Affect: Mood normal.        Behavior: Behavior normal.        Thought Content: Thought content normal.        Judgment: Judgment normal.       No results found for any visits on 07/15/23.      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 No fever. Labs were normal on 07/09/2023. Discussed the need for gradual recovery. -Replace Zofran with Phenergan for nausea  and vomiting control. -Advise patient to maintain hydration with electrolyte-containing fluids like Pedialyte or Body Armor. -Continue monitoring symptoms and report any new or worsening symptoms. -Extend work note to 07/22/2023 to allow for further recovery. -Advise patient to gradually return to normal activities as tolerated.  Follow-up as needed or if new symptoms develop.        Meds ordered this encounter  Medications   promethazine (PHENERGAN) 12.5 MG tablet    Sig: Take 1 tablet (12.5 mg total) by mouth every 8 (eight) hours as needed for nausea or vomiting.    Dispense:  20 tablet    Refill:  0    Order Specific Question:   Supervising Provider    Answer:   Danise Edge A [[id number]]    Return if symptoms worsen or fail to improve.  Clayborne Dana, NP

## 2023-07-22 ENCOUNTER — Other Ambulatory Visit (HOSPITAL_BASED_OUTPATIENT_CLINIC_OR_DEPARTMENT_OTHER): Payer: Self-pay

## 2023-08-13 ENCOUNTER — Other Ambulatory Visit: Payer: Self-pay | Admitting: Family Medicine

## 2023-08-13 ENCOUNTER — Other Ambulatory Visit (INDEPENDENT_AMBULATORY_CARE_PROVIDER_SITE_OTHER): Payer: 59

## 2023-08-13 ENCOUNTER — Encounter: Payer: Self-pay | Admitting: Family Medicine

## 2023-08-13 DIAGNOSIS — Z6831 Body mass index (BMI) 31.0-31.9, adult: Secondary | ICD-10-CM

## 2023-08-13 DIAGNOSIS — E039 Hypothyroidism, unspecified: Secondary | ICD-10-CM

## 2023-08-13 DIAGNOSIS — E782 Mixed hyperlipidemia: Secondary | ICD-10-CM

## 2023-08-13 LAB — COMPREHENSIVE METABOLIC PANEL
ALT: 13 U/L (ref 0–35)
AST: 17 U/L (ref 0–37)
Albumin: 4.5 g/dL (ref 3.5–5.2)
Alkaline Phosphatase: 55 U/L (ref 39–117)
BUN: 16 mg/dL (ref 6–23)
CO2: 31 meq/L (ref 19–32)
Calcium: 10.1 mg/dL (ref 8.4–10.5)
Chloride: 100 meq/L (ref 96–112)
Creatinine, Ser: 0.89 mg/dL (ref 0.40–1.20)
GFR: 69.96 mL/min (ref >60.00)
Glucose, Bld: 73 mg/dL (ref 70–99)
Potassium: 3.6 meq/L (ref 3.5–5.1)
Sodium: 143 meq/L (ref 135–145)
Total Bilirubin: 0.7 mg/dL (ref 0.2–1.2)
Total Protein: 7.7 g/dL (ref 6.0–8.3)

## 2023-08-13 LAB — LIPID PANEL
Cholesterol: 154 mg/dL (ref 0–200)
HDL: 41.4 mg/dL (ref >39.00)
LDL Cholesterol: 81 mg/dL (ref 0–99)
NonHDL: 113.05
Total CHOL/HDL Ratio: 4
Triglycerides: 159 mg/dL — ABNORMAL HIGH (ref 0.0–149.0)
VLDL: 31.8 mg/dL (ref 0.0–40.0)

## 2023-08-13 LAB — TSH: TSH: 0.15 u[IU]/mL — ABNORMAL LOW (ref 0.35–5.50)

## 2023-08-13 MED ORDER — LEVOTHYROXINE SODIUM 150 MCG PO TABS: 150.0000 ug | ORAL_TABLET | Freq: Every day | ORAL | 1 refills | Status: DC

## 2023-08-14 ENCOUNTER — Other Ambulatory Visit (HOSPITAL_BASED_OUTPATIENT_CLINIC_OR_DEPARTMENT_OTHER): Payer: Self-pay

## 2023-08-14 MED ORDER — ZEPBOUND 5 MG/0.5ML ~~LOC~~ SOAJ
5.0000 mg | SUBCUTANEOUS | 3 refills | Status: DC
  Filled 2023-08-14: qty 2, 28d supply, fill #0
  Filled 2023-09-23: qty 2, 28d supply, fill #1
  Filled 2023-10-19: qty 2, 28d supply, fill #2
  Filled 2023-11-18: qty 2, 28d supply, fill #3

## 2023-08-19 ENCOUNTER — Telehealth: Payer: Self-pay | Admitting: Family Medicine

## 2023-08-19 NOTE — Telephone Encounter (Signed)
Patient called and stated that she was admitted at the [facility] for three days and was discharged last night. She explained that she had C-disk. She stated that she was advised that [facility] electronically sent over her information to [place].   I offered to schedule [name] an appointment, however, she requested for a message to be sent back for a nurse to call and speak directly with her. Please call and advise pt.

## 2023-08-19 NOTE — Telephone Encounter (Signed)
Notes from [facility] were printed and placed in basket for provider review.

## 2023-08-19 NOTE — Telephone Encounter (Signed)
Left message on machine for patient to call back to discuss.  

## 2023-08-19 NOTE — Telephone Encounter (Signed)
Discharge summary reviewed. She needs to have some follow-up blood work. Recommend she schedule with GI and ID. If she can't see either of them in the next week or so, she can follow-up here for hospital follow-up with repeat labs.

## 2023-08-20 ENCOUNTER — Telehealth: Payer: Self-pay

## 2023-08-20 NOTE — Transitions of Care (Post Inpatient/ED Visit) (Unsigned)
   08/20/2023  Name: Gina Wise MRN: [id number] DOB: 1961/09/27  Today's TOC FU Call Status: Today's TOC FU Call Status:: Unsuccessful Call (1st Attempt) Unsuccessful Call (1st Attempt) Date: 08/20/23  Attempted to reach the patient regarding the most recent Inpatient/ED visit.  Follow Up Plan: Additional outreach attempts will be made to reach the patient to complete the Transitions of Care (Post Inpatient/ED visit) call.   Signature Karena Addison, LPN [facility] Nurse Health Advisor Direct Dial [phone]

## 2023-08-21 NOTE — Transitions of Care (Post Inpatient/ED Visit) (Signed)
08/21/2023  Name: Gina Wise MRN: [id number] DOB: 1960-11-01  Today's TOC FU Call Status: Today's TOC FU Call Status:: Successful TOC FU Call Completed Unsuccessful Call (1st Attempt) Date: 08/20/23 [facility] FU Call Complete Date: 08/21/23 Patient's Name and Date of Birth confirmed.  Transition Care Management Follow-up Telephone Call Date of Discharge: 08/18/23 Discharge Facility: Other ([facility]) [facility] of [facility] Facility: [facility] Type of Discharge: Inpatient Admission Primary Inpatient Discharge Diagnosis:: abd pain How have you been since you were released from the hospital?: Same Any questions or concerns?: Yes Patient Questions/Concerns:: still weak. she statesshe was unable to get her meds from the pharm Patient Questions/Concerns Addressed: Notified Provider of Patient Questions/Concerns  Items Reviewed: Did you receive and understand the discharge instructions provided?: Yes Medications obtained,verified, and reconciled?: Yes (Medications Reviewed) Any new allergies since your discharge?: No Dietary orders reviewed?: Yes Do you have support at home?: No  Medications Reviewed Today: Medications Reviewed Today     Reviewed by Karena Addison, LPN (Licensed Practical Nurse) on 08/21/23 at 1547  Med List Status: <None>   Medication Order Taking? Sig Documenting Provider Last Dose Status Informant  acetaminophen (TYLENOL) 500 MG tablet [id number] No Take 1,000 mg by mouth as needed. [provider] Taking Active   carboxymethylcellulose (REFRESH PLUS) 0.5 % SOLN [id number] No Place 1 drop into both eyes 3 (three) times daily as needed (dry/irritated eyes). [provider] Taking Active Self  cetirizine (ZYRTEC) 10 MG tablet [id number] No Take 10 mg by mouth daily. [provider] Taking Active Self  diphenhydrAMINE (BENADRYL) 25 MG tablet [id number] No Take 25 mg by mouth every 6 (six) hours as needed for allergies. [provider] Taking Active Self  esomeprazole (NEXIUM) 40 MG capsule [id number] No Take 1 capsule (40 mg total) by mouth 2 (two) times daily. Clayborne Dana, NP Taking Active   guaiFENesin (MUCINEX) 600 MG 12 hr tablet [id number] No Take 2 tablets (1,200 mg total) by mouth 2 (two) times daily. Clayborne Dana, NP Taking Active   hydrochlorothiazide (HYDRODIURIL) 25 MG tablet [id number] No TAKE 1 TABLET(25 MG) BY MOUTH DAILY Clayborne Dana, NP Taking Active   levothyroxine (SYNTHROID) 150 MCG tablet [id number]  Take 1 tablet (150 mcg total) by mouth daily. Clayborne Dana, NP  Active   ondansetron (ZOFRAN) 8 MG tablet [id number] No Take 1 tablet (8 mg total) by mouth every 8 (eight) hours as needed for nausea or vomiting. Clayborne Dana, NP Taking Active   promethazine (PHENERGAN) 12.5 MG tablet [id number]  Take 1 tablet (12.5 mg total) by mouth every 8 (eight) hours as needed for nausea or vomiting. Clayborne Dana, NP  Active   rosuvastatin (CRESTOR) 20 MG tablet [id number] No Take 1 tablet (20 mg total) by mouth daily. Clayborne Dana, NP Taking Active   sertraline (ZOLOFT) 100 MG tablet [id number] No TAKE 1/2 TABLET(50 MG) BY MOUTH EVERY DAY Clayborne Dana, NP Taking Active   SUMAtriptan (IMITREX) 100 MG tablet [id number] No May repeat in 2 hours if headache persists(Plz sched with new provider) Clayborne Dana, NP Taking Active   tirzepatide [facility]) 5 MG/0.5ML Pen [id number]  Inject 5 mg into the skin once a week. Clayborne Dana, NP  Active             Home Care and Equipment/Supplies: Were Home Health Services Ordered?: NA Any new equipment or medical supplies ordered?: NA  Functional Questionnaire: Do you need assistance with bathing/showering  or dressing?: No Do you need assistance with meal preparation?: No Do you need assistance with eating?: No Do you have difficulty maintaining continence: No Do you need assistance with getting out of bed/getting out of a chair/moving?: No Do you have  difficulty managing or taking your medications?: No  Follow up appointments reviewed: PCP Follow-up appointment confirmed?: Yes Date of PCP follow-up appointment?: 08/23/23 Follow-up Provider: [facility] Follow-up appointment confirmed?: No Reason Specialist Follow-Up Not Confirmed: Patient has Specialist Provider Number and will Call for Appointment Do you need transportation to your follow-up appointment?: No Do you understand care options if your condition(s) worsen?: Yes-patient verbalized understanding  Patient not able to get vancomycin  SIGNATURE Karena Addison, LPN [facility] Nurse Health Advisor Direct Dial [phone]

## 2023-08-23 ENCOUNTER — Encounter: Payer: Self-pay | Admitting: Family Medicine

## 2023-08-23 ENCOUNTER — Other Ambulatory Visit (HOSPITAL_BASED_OUTPATIENT_CLINIC_OR_DEPARTMENT_OTHER): Payer: Self-pay

## 2023-08-23 ENCOUNTER — Ambulatory Visit: Payer: 59 | Admitting: Family Medicine

## 2023-08-23 VITALS — BP 142/75 | HR 90 | Ht 65.0 in | Wt 183.0 lb

## 2023-08-23 DIAGNOSIS — R197 Diarrhea, unspecified: Secondary | ICD-10-CM

## 2023-08-23 DIAGNOSIS — G4700 Insomnia, unspecified: Secondary | ICD-10-CM | POA: Diagnosis not present

## 2023-08-23 DIAGNOSIS — Z09 Encounter for follow-up examination after completed treatment for conditions other than malignant neoplasm: Secondary | ICD-10-CM

## 2023-08-23 DIAGNOSIS — E878 Other disorders of electrolyte and fluid balance, not elsewhere classified: Secondary | ICD-10-CM

## 2023-08-23 LAB — CBC WITH DIFFERENTIAL/PLATELET
Basophils Absolute: 0 10*3/uL (ref 0.0–0.1)
Basophils Relative: 0.5 % (ref 0.0–3.0)
Eosinophils Absolute: 0.6 10*3/uL (ref 0.0–0.7)
Eosinophils Relative: 6.7 % — ABNORMAL HIGH (ref 0.0–5.0)
HCT: 42.6 % (ref 36.0–46.0)
Hemoglobin: 13.7 g/dL (ref 12.0–15.0)
Lymphocytes Relative: 27.3 % (ref 12.0–46.0)
Lymphs Abs: 2.3 10*3/uL (ref 0.7–4.0)
MCHC: 32.2 g/dL (ref 30.0–36.0)
MCV: 86.8 fL (ref 78.0–100.0)
Monocytes Absolute: 0.7 10*3/uL (ref 0.1–1.0)
Monocytes Relative: 8.4 % (ref 3.0–12.0)
Neutro Abs: 4.8 10*3/uL (ref 1.4–7.7)
Neutrophils Relative %: 57.1 % (ref 43.0–77.0)
Platelets: 313 10*3/uL (ref 150.0–400.0)
RBC: 4.91 Mil/uL (ref 3.87–5.11)
RDW: 14.8 % (ref 11.5–15.5)
WBC: 8.5 10*3/uL (ref 4.0–10.5)

## 2023-08-23 LAB — COMPREHENSIVE METABOLIC PANEL
ALT: 14 U/L (ref 0–35)
AST: 15 U/L (ref 0–37)
Albumin: 4.3 g/dL (ref 3.5–5.2)
Alkaline Phosphatase: 47 U/L (ref 39–117)
BUN: 12 mg/dL (ref 6–23)
CO2: 27 meq/L (ref 19–32)
Calcium: 9.3 mg/dL (ref 8.4–10.5)
Chloride: 104 meq/L (ref 96–112)
Creatinine, Ser: 0.81 mg/dL (ref 0.40–1.20)
GFR: 78.31 mL/min (ref >60.00)
Glucose, Bld: 82 mg/dL (ref 70–99)
Potassium: 4.4 meq/L (ref 3.5–5.1)
Sodium: 141 meq/L (ref 135–145)
Total Bilirubin: 0.5 mg/dL (ref 0.2–1.2)
Total Protein: 7.2 g/dL (ref 6.0–8.3)

## 2023-08-23 LAB — MAGNESIUM: Magnesium: 2 mg/dL (ref 1.5–2.5)

## 2023-08-23 MED ORDER — ZOLPIDEM TARTRATE 5 MG PO TABS
5.0000 mg | ORAL_TABLET | Freq: Every evening | ORAL | 0 refills | Status: DC | PRN
  Filled 2023-08-23: qty 15, 15d supply, fill #0

## 2023-08-23 NOTE — Progress Notes (Signed)
Acute Office Visit  Subjective:     Patient ID: Gina Wise, female    DOB: 12/04/1960, 62 y.o.   MRN: [id number]  Chief Complaint  Patient presents with   Medical Management of Chronic Issues    HPI Patient is in today for hospital follow-up.   Discussed the use of AI scribe software for clinical note transcription with the patient, who gave verbal consent to proceed.  History of Present Illness   The patient presents for a follow-up visit after a recent hospitalization. They reported an episode of sudden onset diaphoresis, diarrhea, and vomiting during a visit to the surgeon for a follow-up. The patient experienced a near syncope episode and was found to have a significantly low potassium level of 2.7, which further dropped during the hospital stay. They described a terrifying experience of near paralysis due to the low potassium level.  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 diff flare, including diarrhea with a characteristic odor. However, the toxin test was negative. Given presentation and history, she was treated empirically for C. diff. The patient was treated with IV antibiotics and discharged on oral Vanc, but states she has not been able to pick it up yet - she hesitant to take it given some improvement.    The patient has been experiencing ongoing symptoms since discharge, including lightheadedness, tremors, and clammy sweats. They also reported a change in the nature of the diarrhea, which is now different from the previous C. diff episodes. 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  The patient's recovery has been further complicated by significant personal stress, including the loss of two pets within a week. They reported difficulty sleeping and requested assistance with  sleep management. The patient has not been able to go back to work due to feeling poorly.          ROS All review of systems negative except what is listed in the HPI      Objective:    BP (!) 142/75   Pulse 90   Ht 5\' 5"  (1.651 m)   Wt 183 lb (83 kg)   LMP 04/03/2012 Comment: Still has Cervix and Ovaries  SpO2 100%   BMI 30.45 kg/m    Physical Exam Vitals reviewed.  Constitutional:      General: She is not in acute distress.    Appearance: Normal appearance.  Cardiovascular:     Rate and Rhythm: Normal rate and regular rhythm.     Heart sounds: Normal heart sounds.  Pulmonary:     Effort: Pulmonary effort is normal.     Breath sounds: Normal breath sounds. No wheezing, rhonchi or rales.  Abdominal:     General: Abdomen is flat. Bowel sounds are normal. There is no distension.     Palpations: Abdomen is soft.     Tenderness: There is no guarding.  Skin:    General: Skin is warm and dry.  Neurological:     Mental Status: She is alert and oriented to person, place, and time.  Psychiatric:        Mood and Affect: Mood normal.        Behavior: Behavior normal.        Thought Content: Thought content normal.        Judgment: Judgment normal.  No results found for any visits on 08/23/23.      Assessment & Plan:   Problem List Items Addressed This Visit       Active Problems   Insomnia    Patient reports difficulty sleeping, likely related to recent illness and stress. -Prescribe low-dose Ambien (5mg ) for short-term use. She has done well on this in the past. Can try starting with 1/2 tablet.       Relevant Medications   zolpidem (AMBIEN) 5 MG tablet   Other Visit Diagnoses     Diarrhea, unspecified type    -  Primary Recent episode of severe diarrhea and vomiting, with associated hypokalemia. Symptoms suggestive of C. diff infection, but toxin test negative. Patient has a history of C. diff and fecal transplant. Current symptoms have  improved, but still present. -Check electrolytes and other labs today. -Provided stool kit for retesting if symptoms worsen. -Continue hydration and probiotics. -If symptoms worsen and stool test positive for C. diff, consider starting Vancomycin. Recommend scheduling follow-up with GI and ID. -We will work on filling out her work/FMLA papers and she will touch base next week to see progress.     Relevant Orders   Comprehensive metabolic panel   Magnesium   CBC with Differential/Platelet   Clostridium Difficile by PCR   Electrolyte abnormality       Relevant Orders   Comprehensive metabolic panel   Magnesium   Hospital discharge follow-up     See above.  Updating labs BP high, but not feeling well and hasn't taken meds today. Recommend monitoring at home and resuming regular medications.    Relevant Orders   Comprehensive metabolic panel   Magnesium   CBC with Differential/Platelet   Clostridium Difficile by PCR       Meds ordered this encounter  Medications   zolpidem (AMBIEN) 5 MG tablet    Sig: Take 1 tablet (5 mg total) by mouth at bedtime as needed for sleep.    Dispense:  15 tablet    Refill:  0    Order Specific Question:   Supervising Provider    Answer:   Danise Edge A [[id number]]    Return if symptoms worsen or fail to improve.  Clayborne Dana, NP

## 2023-08-23 NOTE — Assessment & Plan Note (Signed)
Patient reports difficulty sleeping, likely related to recent illness and stress. -Prescribe low-dose Ambien (5mg ) for short-term use. She has done well on this in the past. Can try starting with 1/2 tablet.

## 2023-08-24 ENCOUNTER — Encounter: Payer: Self-pay | Admitting: Family Medicine

## 2023-08-24 DIAGNOSIS — R112 Nausea with vomiting, unspecified: Secondary | ICD-10-CM

## 2023-08-26 MED ORDER — ONDANSETRON HCL 8 MG PO TABS: 8.0000 mg | ORAL_TABLET | Freq: Three times a day (TID) | ORAL | 2 refills | Status: DC | PRN

## 2023-09-03 ENCOUNTER — Encounter: Payer: Self-pay | Admitting: Family Medicine

## 2023-09-03 NOTE — Telephone Encounter (Signed)
We received paperwork, when do you want me to write her back to work?

## 2023-09-05 ENCOUNTER — Telehealth: Payer: Self-pay

## 2023-09-05 NOTE — Transitions of Care (Post Inpatient/ED Visit) (Signed)
09/05/2023  Name: Gina Wise MRN: [id number] DOB: 05-26-1961  Today's TOC FU Call Status: Today's TOC FU Call Status:: Successful TOC FU Call Completed Unsuccessful Call (1st Attempt) Date: 09/05/23 [facility] FU Call Complete Date: 09/05/23 Patient's Name and Date of Birth confirmed.  Transition Care Management Follow-up Telephone Call Date of Discharge: 09/04/23 Discharge Facility: Other [facility]) Name of Other (Non[facility]) Discharge Facility: [facility] Type of Discharge: Inpatient Admission Primary Inpatient Discharge Diagnosis:: vomiting and diarrhea How have you been since you were released from the hospital?: Better (pt states she feels shaky but not sick and is thankful for that) Patient Questions/Concerns:: Patient is concerned about stopping vanco at 6 week mark because she "doesn't want to be this sick again" Patient Questions/Concerns Addressed: Notified Provider of Patient Questions/Concerns  Items Reviewed: Did you receive and understand the discharge instructions provided?: Yes Medications obtained,verified, and reconciled?: Yes (Medications Reviewed) Any new allergies since your discharge?: No Dietary orders reviewed?: Yes Type of Diet Ordered:: low dairy and low fiber Do you have support at home?: Yes People in Home: child(ren), adult  Medications Reviewed Today: Medications Reviewed Today     Reviewed by Kirby Cortese, Jordan Hawks, CMA (Certified Medical Assistant) on 09/05/23 at 1604  Med List Status: <None>   Medication Order Taking? Sig Documenting Provider Last Dose Status Informant  acetaminophen (TYLENOL) 500 MG tablet [id number] No Take 1,000 mg by mouth as needed. [provider] Taking Active   carboxymethylcellulose (REFRESH PLUS) 0.5 % SOLN [id number] No Place 1 drop into both eyes 3 (three) times daily as needed (dry/irritated eyes). [provider] Taking Active Self  cetirizine (ZYRTEC) 10 MG tablet [id number] No Take 10 mg by mouth daily.  [provider] Taking Active Self  diphenhydrAMINE (BENADRYL) 25 MG tablet [id number] No Take 25 mg by mouth every 6 (six) hours as needed for allergies. [provider] Taking Active Self  esomeprazole (NEXIUM) 40 MG capsule [id number] No Take 1 capsule (40 mg total) by mouth 2 (two) times daily. Clayborne Dana, NP Taking Active   guaiFENesin (MUCINEX) 600 MG 12 hr tablet [id number] No Take 2 tablets (1,200 mg total) by mouth 2 (two) times daily. Clayborne Dana, NP Taking Active   hydrochlorothiazide (HYDRODIURIL) 25 MG tablet [id number] No TAKE 1 TABLET(25 MG) BY MOUTH DAILY Clayborne Dana, NP Taking Active   levothyroxine (SYNTHROID) 150 MCG tablet [id number] No Take 1 tablet (150 mcg total) by mouth daily. Clayborne Dana, NP Taking Active   ondansetron (ZOFRAN) 8 MG tablet [id number]  Take 1 tablet (8 mg total) by mouth every 8 (eight) hours as needed for nausea or vomiting. Clayborne Dana, NP  Active   promethazine (PHENERGAN) 12.5 MG tablet [id number] No Take 1 tablet (12.5 mg total) by mouth every 8 (eight) hours as needed for nausea or vomiting. Clayborne Dana, NP Taking Active   rosuvastatin (CRESTOR) 20 MG tablet [id number] No Take 1 tablet (20 mg total) by mouth daily. Clayborne Dana, NP Taking Active   sertraline (ZOLOFT) 100 MG tablet [id number] No TAKE 1/2 TABLET(50 MG) BY MOUTH EVERY DAY Clayborne Dana, NP Taking Active   SUMAtriptan (IMITREX) 100 MG tablet [id number] No May repeat in 2 hours if headache persists(Plz sched with new provider) Clayborne Dana, NP Taking Active   tirzepatide [facility]) 5 MG/0.5ML Pen [id number] No Inject 5 mg into the skin once a week. Clayborne Dana, NP Taking Active   zolpidem [facility]) 5 MG tablet [id number]  Take 1 tablet (5 mg total) by mouth at bedtime as needed for sleep. Clayborne Dana, NP  Active             Home Care and Equipment/Supplies: Were Home Health Services Ordered?: NA Any new equipment or medical supplies ordered?:  NA  Functional Questionnaire: Do you need assistance with bathing/showering or dressing?: No Do you need assistance with meal preparation?: No Do you need assistance with eating?: No Do you have difficulty maintaining continence: No Do you need assistance with getting out of bed/getting out of a chair/moving?: No Do you have difficulty managing or taking your medications?: No  Follow up appointments reviewed: PCP Follow-up appointment confirmed?: Yes Date of PCP follow-up appointment?: 09/10/23 Follow-up Provider: Hyman Hopes, NP Specialist Hospital Follow-up appointment confirmed?: NA Do you need transportation to your follow-up appointment?: No Do you understand care options if your condition(s) worsen?: Yes-patient verbalized understanding    Abby Alyaan Budzynski, CMA  [facility] AWV Team Direct Dial: [phone]

## 2023-09-05 NOTE — Transitions of Care (Post Inpatient/ED Visit) (Signed)
   09/05/2023  Name: Gina Wise MRN: [id number] DOB: [birth year]  Today's TOC FU Call Status: Today's TOC FU Call Status:: Unsuccessful Call (1st Attempt) Unsuccessful Call (1st Attempt) Date: 09/05/23  Attempted to reach the patient regarding the most recent Inpatient/ED visit.  Follow Up Plan: Additional outreach attempts will be made to reach the patient to complete the Transitions of Care (Post Inpatient/ED visit) call.   Abby Exavior Kimmons, CMA  [facility] AWV Team Direct Dial: [phone]

## 2023-09-10 ENCOUNTER — Encounter: Payer: Self-pay | Admitting: Family Medicine

## 2023-09-10 ENCOUNTER — Ambulatory Visit (INDEPENDENT_AMBULATORY_CARE_PROVIDER_SITE_OTHER): Payer: 59 | Admitting: Family Medicine

## 2023-09-10 ENCOUNTER — Telehealth: Payer: Self-pay | Admitting: Internal Medicine

## 2023-09-10 VITALS — BP 135/85 | HR 98 | Ht 65.0 in | Wt 178.0 lb

## 2023-09-10 DIAGNOSIS — K219 Gastro-esophageal reflux disease without esophagitis: Secondary | ICD-10-CM | POA: Diagnosis not present

## 2023-09-10 DIAGNOSIS — R112 Nausea with vomiting, unspecified: Secondary | ICD-10-CM

## 2023-09-10 DIAGNOSIS — Z09 Encounter for follow-up examination after completed treatment for conditions other than malignant neoplasm: Secondary | ICD-10-CM

## 2023-09-10 DIAGNOSIS — A09 Infectious gastroenteritis and colitis, unspecified: Secondary | ICD-10-CM

## 2023-09-10 LAB — CBC WITH DIFFERENTIAL/PLATELET
Basophils Absolute: 0 10*3/uL (ref 0.0–0.1)
Basophils Relative: 0.6 % (ref 0.0–3.0)
Eosinophils Absolute: 0.5 10*3/uL (ref 0.0–0.7)
Eosinophils Relative: 6.9 % — ABNORMAL HIGH (ref 0.0–5.0)
HCT: 44.7 % (ref 36.0–46.0)
Hemoglobin: 14.9 g/dL (ref 12.0–15.0)
Lymphocytes Relative: 27.1 % (ref 12.0–46.0)
Lymphs Abs: 2.1 10*3/uL (ref 0.7–4.0)
MCHC: 33.2 g/dL (ref 30.0–36.0)
MCV: 86.3 fL (ref 78.0–100.0)
Monocytes Absolute: 0.8 10*3/uL (ref 0.1–1.0)
Monocytes Relative: 10.2 % (ref 3.0–12.0)
Neutro Abs: 4.3 10*3/uL (ref 1.4–7.7)
Neutrophils Relative %: 55.2 % (ref 43.0–77.0)
Platelets: 430 10*3/uL — ABNORMAL HIGH (ref 150.0–400.0)
RBC: 5.18 Mil/uL — ABNORMAL HIGH (ref 3.87–5.11)
RDW: 15.1 % (ref 11.5–15.5)
WBC: 7.9 10*3/uL (ref 4.0–10.5)

## 2023-09-10 LAB — COMPREHENSIVE METABOLIC PANEL
ALT: 12 U/L (ref 0–35)
AST: 16 U/L (ref 0–37)
Albumin: 4.8 g/dL (ref 3.5–5.2)
Alkaline Phosphatase: 51 U/L (ref 39–117)
BUN: 11 mg/dL (ref 6–23)
CO2: 28 meq/L (ref 19–32)
Calcium: 10.5 mg/dL (ref 8.4–10.5)
Chloride: 98 meq/L (ref 96–112)
Creatinine, Ser: 0.99 mg/dL (ref 0.40–1.20)
GFR: 61.53 mL/min (ref >60.00)
Glucose, Bld: 95 mg/dL (ref 70–99)
Potassium: 4.2 meq/L (ref 3.5–5.1)
Sodium: 136 meq/L (ref 135–145)
Total Bilirubin: 0.6 mg/dL (ref 0.2–1.2)
Total Protein: 8.1 g/dL (ref 6.0–8.3)

## 2023-09-10 LAB — MAGNESIUM: Magnesium: 2.2 mg/dL (ref 1.5–2.5)

## 2023-09-10 MED ORDER — PROMETHAZINE HCL 12.5 MG PO TABS: 12.5000 mg | ORAL_TABLET | Freq: Three times a day (TID) | ORAL | 0 refills | Status: DC | PRN

## 2023-09-10 MED ORDER — PANTOPRAZOLE SODIUM 40 MG PO TBEC: 40.0000 mg | DELAYED_RELEASE_TABLET | Freq: Every day | ORAL | 3 refills | Status: DC

## 2023-09-10 MED ORDER — ONDANSETRON HCL 8 MG PO TABS: 8.0000 mg | ORAL_TABLET | Freq: Three times a day (TID) | ORAL | 2 refills | Status: DC | PRN

## 2023-09-10 NOTE — Telephone Encounter (Signed)
Pt scheduled to see Hyacinth Meeker PA 09/18/23 at 9am. Detailed message left on pts voicemail.

## 2023-09-10 NOTE — Progress Notes (Signed)
Acute Office Visit  Subjective:     Patient ID: Gina Wise, female    DOB: 1961-04-23, 62 y.o.   MRN: [id number]  Chief Complaint  Patient presents with   Hospitalization Follow-up    HPI Patient is in today for hospital follow-up.   Discussed the use of AI scribe software for clinical note transcription with the patient, who gave verbal consent to proceed.  History of Present Illness   The patient, with a history of gastrointestinal issues and a fecal transplant for C. diff in 2022, was recently hospitalized for six days due to severe diarrhea, vomiting, and electrolyte imbalances, specifically low potassium. This is a recurrent issue for the patient, who has had similar episodes in the past. During the hospital stay, the patient tested positive for a PCR test but negative for a toxin test, similar to a previous episode at [facility]. Despite the negative toxin test, the patient was treated for C. diff with IV Flagyl and oral vancomycin, which led to symptom improvement within a few days. The patient is currently on a long taper of vancomycin, which they are concerned may negatively impact their gut biome.  The patient also has a history of COVID-19, which they believe triggered their gastrointestinal issues. Both times they had COVID-19, the primary symptoms were gastrointestinal rather than respiratory. The patient also has a small hernia and has been experiencing significant acid reflux, which they believe may be contributing to their ongoing nausea.  The patient is also dealing with low energy and weakness, which they attribute to the ongoing diarrhea and potential nutrient loss. They are trying to stay hydrated and are taking probiotics to help maintain their gut health. The patient is also on Nexium for acid reflux, but it does not seem to be working effectively, and they have requested a switch to Protonix.  The patient also has a local GI doctor, but they have not been able to get an  appointment with them yet. She is going to call again today.        ROS All review of systems negative except what is listed in the HPI      Objective:    BP 135/85   Pulse 98   Ht 5\' 5"  (1.651 m)   Wt 178 lb (80.7 kg)   LMP 04/03/2012 Comment: Still has Cervix and Ovaries  SpO2 99%   BMI 29.62 kg/m    Physical Exam Vitals reviewed.  Constitutional:      General: She is not in acute distress.    Appearance: Normal appearance.  Cardiovascular:     Rate and Rhythm: Normal rate and regular rhythm.     Heart sounds: Normal heart sounds.  Pulmonary:     Effort: Pulmonary effort is normal.     Breath sounds: Normal breath sounds. No wheezing, rhonchi or rales.  Abdominal:     General: Abdomen is flat. Bowel sounds are normal. There is no distension.     Palpations: Abdomen is soft.     Tenderness: There is no guarding.  Skin:    General: Skin is warm and dry.  Neurological:     Mental Status: She is alert and oriented to person, place, and time.  Psychiatric:        Mood and Affect: Mood normal.        Behavior: Behavior normal.        Thought Content: Thought content normal.        Judgment: Judgment normal.  No results found for any visits on 09/10/23.      Assessment & Plan:   Problem List Items Addressed This Visit       Active Problems   GERD (gastroesophageal reflux disease)   Relevant Medications   ondansetron (ZOFRAN) 8 MG tablet   pantoprazole (PROTONIX) 40 MG tablet   Other Visit Diagnoses     Hospital discharge follow-up    -  Primary   Relevant Orders   CBC with Differential/Platelet   Comprehensive metabolic panel   Magnesium   Nausea and vomiting, unspecified vomiting type       Relevant Medications   ondansetron (ZOFRAN) 8 MG tablet   promethazine (PHENERGAN) 12.5 MG tablet   Other Relevant Orders   CBC with Differential/Platelet   Comprehensive metabolic panel   Magnesium   Diarrhea of infectious origin       Relevant  Medications   vancomycin (VANCOCIN) 125 MG capsule   Other Relevant Orders   CBC with Differential/Platelet   Comprehensive metabolic panel   Magnesium     Recurrent Clostridium difficile infection Recent hospitalization for severe diarrhea, vomiting, and electrolyte imbalances. PCR positive for C. diff, toxin negative. Responded to IV Flagyl and oral Vancomycin. Currently on a long taper of oral Vancomycin. -Continue Vancomycin taper as prescribed. -Ensure probiotics are taken at least 2 hours apart from antibiotics. -Follow-up with GI for additional management.  Electrolyte imbalances Recent low potassium and magnesium levels requiring IV replacement. -Check complete metabolic panel and magnesium today to ensure stability. -Encourage hydration and consumption of electrolyte-rich fluids.  Gastroesophageal reflux disease Symptoms not well controlled on Nexium. -Switch Nexium to Protonix.  Return to work Patient wishes to return to work after recent hospitalization. -Assist with any necessary paperwork for work, consider half-days or intermittent FMLA as needed.   Meds ordered this encounter  Medications   ondansetron (ZOFRAN) 8 MG tablet    Sig: Take 1 tablet (8 mg total) by mouth every 8 (eight) hours as needed for nausea or vomiting.    Dispense:  20 tablet    Refill:  2    Order Specific Question:   Supervising Provider    Answer:   Danise Edge A [[id number]]   promethazine (PHENERGAN) 12.5 MG tablet    Sig: Take 1 tablet (12.5 mg total) by mouth every 8 (eight) hours as needed for nausea or vomiting.    Dispense:  20 tablet    Refill:  0    Order Specific Question:   Supervising Provider    Answer:   Danise Edge A [[id number]]   pantoprazole (PROTONIX) 40 MG tablet    Sig: Take 1 tablet (40 mg total) by mouth daily.    Dispense:  90 tablet    Refill:  3    Order Specific Question:   Supervising Provider    Answer:   Danise Edge A [[id number]]    Return if symptoms worsen or  fail to improve.  Clayborne Dana, NP

## 2023-09-10 NOTE — Telephone Encounter (Signed)
PT is calling to find out why an appointment hasn't been scheduled for her after being seen in hospital 3 times. I advised that we can schedule but she said that February is too long. Please advise.

## 2023-09-13 ENCOUNTER — Encounter: Payer: Self-pay | Admitting: Family Medicine

## 2023-09-16 ENCOUNTER — Encounter: Payer: Self-pay | Admitting: Internal Medicine

## 2023-09-16 NOTE — Telephone Encounter (Signed)
Pt rescheduled to see Hyacinth Meeker PA 09/25/23 at 11am. Pt aware of appt.

## 2023-09-16 NOTE — Telephone Encounter (Signed)
PT returned call regarding appointment. She will not be able to get transportation for appt on 11/27 and wants to know is there a way she can be fitted in. I advised next available is 2/10. Please advise

## 2023-09-18 ENCOUNTER — Ambulatory Visit: Payer: 59 | Admitting: Physician Assistant

## 2023-09-20 ENCOUNTER — Encounter: Payer: Self-pay | Admitting: Family Medicine

## 2023-09-23 ENCOUNTER — Other Ambulatory Visit (HOSPITAL_BASED_OUTPATIENT_CLINIC_OR_DEPARTMENT_OTHER): Payer: Self-pay

## 2023-09-25 ENCOUNTER — Other Ambulatory Visit (HOSPITAL_BASED_OUTPATIENT_CLINIC_OR_DEPARTMENT_OTHER): Payer: Self-pay

## 2023-09-25 ENCOUNTER — Encounter: Payer: Self-pay | Admitting: Physician Assistant

## 2023-09-25 ENCOUNTER — Ambulatory Visit: Payer: 59 | Admitting: Physician Assistant

## 2023-09-25 VITALS — BP 104/68 | HR 91 | Ht 65.0 in | Wt 178.1 lb

## 2023-09-25 DIAGNOSIS — A0471 Enterocolitis due to Clostridium difficile, recurrent: Secondary | ICD-10-CM

## 2023-09-25 DIAGNOSIS — R112 Nausea with vomiting, unspecified: Secondary | ICD-10-CM | POA: Diagnosis not present

## 2023-09-25 DIAGNOSIS — K219 Gastro-esophageal reflux disease without esophagitis: Secondary | ICD-10-CM

## 2023-09-25 DIAGNOSIS — Z8719 Personal history of other diseases of the digestive system: Secondary | ICD-10-CM

## 2023-09-25 DIAGNOSIS — R1084 Generalized abdominal pain: Secondary | ICD-10-CM

## 2023-09-25 MED ORDER — ONDANSETRON 4 MG PO TBDP: 4.0000 mg | ORAL_TABLET | Freq: Three times a day (TID) | ORAL | 6 refills | Status: DC | PRN

## 2023-09-25 NOTE — Patient Instructions (Addendum)
We have sent the following medications to your pharmacy for you to pick up at your convenience: Zofran  Finish Vancomycin taper  Follow up in 3 months  If your blood pressure at your visit was 140/90 or greater, please contact your primary care physician to follow up on this.   If you are age 61 or older, your body mass index should be between 23-30. Your Body mass index is 29.64 kg/m. If this is out of the aforementioned range listed, please consider follow up with your Primary Care Provider.  If you are age 52 or younger, your body mass index should be between 19-25. Your Body mass index is 29.64 kg/m. If this is out of the aformentioned range listed, please consider follow up with your Primary Care Provider.     The [facility] providers would like to encourage you to use [facility] to communicate with providers for non-urgent requests or questions.  Due to long hold times on the telephone, sending your provider a message by [facility] may be a faster and more efficient way to get a response.  Please allow 48 business hours for a response.  Please remember that this is for non-urgent requests.    Thank you for entrusting me with your care and choosing [facility].  Hyacinth Meeker PA-C

## 2023-09-25 NOTE — Progress Notes (Signed)
Chief Complaint: Follow-up C. difficile  HPI:    [name] is a 62 year old female with a past medical history as listed below including lupus and interstitial lung disease as well as hypothyroidism, known to Dr. Rhea Wise, who was referred to me by Gina Dana, NP for follow-up of C. difficile.    04/2022 EGD with the finding of a low-grade Schatzki's ring which was dilated to 20 mm and noted to have a 5 cm hiatal hernia and mild gastritis.  Due to refractory symptoms she had been referred to surgery for hiatal hernia repair and underwent repair of a paraesophageal hernia with a toupee procedure in July 2023.  She had ongoing issues of retching and dry heaves.  Initial upper GI done in September 2023 showed a normal-appearing esophagus and no GERD at that time the wrap was not definitely identified.    12/06/2022 office visit with Gina Wise at that time described episodes of actual vomiting as well as constipation.  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  At that point had been scheduled for EGD and colonoscopy with Dr. Rhea Wise in mid March.    01/01/2023 EGD with recurrent 5 cm hiatal hernia despite repair with toupee in 7/23 as well as gastritis.  At that point referred to [facility] for consideration of redo of hiatal hernia repair.    01/01/2023 colonoscopy with moderate diverticulosis in the sigmoid colon, hepatic flexure and ascending colon with sharp angulation of the colon in association with diverticular opening in the mid sigmoid which partially explain constipation.  Patient given a trial of Trulance 3 mg daily.  Repeat colon in 10 years.    01/19/2023 patient tried on Motegrity 2 mg daily.    08/30/2023 patient admitted to the hospital.  At that time discussed the history of C. difficile colitis and status post multiple hospitalizations and courses of antibiotics as well as a fecal transplant who presented 11/9 with a complaint of recurrent  diarrhea and associated hypomagnesemia and hypokalemia.  Had recently been admitted and discharged from [facility] on 10/27 recommendations to continue oral Vancomycin and p.o. Flagyl.  But she was unable to complete due to cost.  At that time on Oral vancomycin and p.o. Flagyl with good clinical response and cessation of diarrhea.  CTAP with no acute intra-abdominal process, small hiatal hernia and diverticulosis without diverticulitis.    11/9 complaint of watery diarrhea and large volumes for 2 days.  Wise negative, C. difficile antigen positive but toxin negative.  ID was on board and patient discharged with 7 weeks of p.o. Vancomycin.  FOBT was positive.  Flex sig was unremarkable.    09/01/2019 4 GI path panel negative.  Patient found to be C. difficile antigen positive but toxin negative.    09/03/2023 CBC normal.    09/03/2023 patient underwent a flex sigmoidoscopy at [facility].  Patient found to have liquid and solid stool noted throughout, underlying rectal and sigmoid mucosa appeared normal.  Biopsies showed unremarkable mucosa.    09/04/2023 CMP with a creatinine of 0.59, glucose 105 and otherwise normal.    Today, patient describes 2 separate hospitalizations as above for C. difficile.  Apparently after the first admission her symptoms got better after 2 days of oral vancomycin and IV Flagyl.  She was discharged with Vancomycin but was unable to afford this medicine, a few weeks later symptoms came back and [facility].  She is currently on  a prolonged Vancomycin taper taking her last day of twice daily meds.  She is very nervous to come off of this medicine in the future even though she is on a prolonged taper given that she has a history of C. difficile which had to be treated with fecal transplant in the past.  Currently no further symptoms.  Occasional right lower quadrant discomfort.    Also describes that when she gets up in the morning she feels okay but then will sit down  to work and will have a rush of nausea and have to run to the toilet to vomit, occasionally this is the dinner she had the night before and other times just acid.  Describes following with [facility] after her last hiatal hernia repair.  Apparently they did workup and they showed that her hiatal hernia was only 1 cm now which was much better and there is nothing else to do surgically.  Patient worried regarding ongoing vomiting.  Apparently had Zofran in the past but had not been using this over the past week or so and has noticed an increase in symptoms.  Previous gastric emptying study normal.  Does describe a sensitive gag reflex.    Denies fever, chills or weight loss.  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   Allergy    Anemia    Aortic atherosclerosis (HCC)    Bruising 05/10/2015   CAP (community acquired pneumonia) 08/03/2015   Chronic bronchitis (HCC)    "get it q yr; in the spring time" (02/17/2014)   COVID-19    Depression    "have taken zoloft since 1996" (02/17/2014)   Diverticulosis    Duodenal diverticulum    Dyspnea    from scar tissue   Epistaxis 12/01/2015   Gastritis    GERD (gastroesophageal reflux disease)    Hematemesis with nausea 08/01/2016   Hiatal hernia    High cholesterol    Hypertension    Hypothyroid    Internal hemorrhoids    Migraines    "maybe a couple/yr" (02/17/2014)   Multiple environmental allergies    Overweight(278.02)    Peri-menopause    PONV (postoperative nausea and vomiting)    Pre-diabetes    Schatzki's ring    Sjogren's disease (HCC)    Systemic lupus (HCC)     Past Surgical History:  Procedure Laterality Date   APPENDECTOMY  1987   BREAST BIOPSY Left 01/30/2018   BREAST EXCISIONAL BIOPSY Left 02/25/2018   ESOPHAGEAL MANOMETRY N/A 05/02/2022   Procedure: ESOPHAGEAL MANOMETRY (EM);  Surgeon: Beverley Fiedler, MD;  Location: [facility];  Service: Gastroenterology;  Laterality: N/A;    ESOPHAGOGASTRODUODENOSCOPY N/A 02/28/2014   Procedure: ESOPHAGOGASTRODUODENOSCOPY (EGD);  Surgeon: Beverley Fiedler, MD;  Location: [facility] ENDOSCOPY;  Service: Endoscopy;  Laterality: N/A;   ESOPHAGOGASTRODUODENOSCOPY N/A 08/02/2016   Procedure: ESOPHAGOGASTRODUODENOSCOPY (EGD);  Surgeon: Iva Boop, MD;  Location: [facility] ENDOSCOPY;  Service: Endoscopy;  Laterality: N/A;   EXPLORATORY LAPAROTOMY  1987   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   FOOT SURGERY Right ~1980   "don't know what they did; had to take something out"   HERNIA REPAIR     INGUINAL HERNIA REPAIR Right ~ 2003   RADIOACTIVE SEED GUIDED EXCISIONAL BREAST BIOPSY Left 02/25/2018   Procedure: RADIOACTIVE SEED GUIDED EXCISIONAL BREAST BIOPSY;  Surgeon: Emelia Loron, MD;  Location: [facility];  Service: General;  Laterality: Left;   SHOULDER OPEN ROTATOR CUFF REPAIR Left 1995  SHOULDER SURGERY Left 1997   "cartilage"   TONSILLECTOMY AND ADENOIDECTOMY  1976   TUBAL LIGATION  1998   laparoscopic   VAGINAL HYSTERECTOMY  04/08/2012   menorrhagia   VAGINAL PROLAPSE REPAIR  08/04/2018   Anterior and posterior with colp-mesh-Dr. Dessa Phi @ [facility]   XI ROBOTIC ASSISTED HIATAL HERNIA REPAIR N/A 05/21/2022   Procedure: XI ROBOTIC ASSISTED HIATAL HERNIA REPAIR WITH TOUPET FUNDOPLICATION;  Surgeon: Berna Bue, MD;  Location: [facility];  Service: General;  Laterality: N/A;    Current Outpatient Medications  Medication Sig Dispense Refill   acetaminophen (TYLENOL) 500 MG tablet Take 1,000 mg by mouth as needed.     carboxymethylcellulose (REFRESH PLUS) 0.5 % SOLN Place 1 drop into both eyes 3 (three) times daily as needed (dry/irritated eyes).     cetirizine (ZYRTEC) 10 MG tablet Take 10 mg by mouth daily.     diphenhydrAMINE (BENADRYL) 25 MG tablet Take 25 mg by mouth every 6 (six) hours as needed for allergies.     guaiFENesin (MUCINEX) 600 MG 12 hr tablet Take 2 tablets (1,200 mg total) by mouth 2  (two) times daily. 30 tablet 0   hydrochlorothiazide (HYDRODIURIL) 25 MG tablet TAKE 1 TABLET(25 MG) BY MOUTH DAILY 90 tablet 1   levothyroxine (SYNTHROID) 150 MCG tablet Take 1 tablet (150 mcg total) by mouth daily. 90 tablet 1   ondansetron (ZOFRAN) 8 MG tablet Take 1 tablet (8 mg total) by mouth every 8 (eight) hours as needed for nausea or vomiting. 20 tablet 2   pantoprazole (PROTONIX) 40 MG tablet Take 1 tablet (40 mg total) by mouth daily. 90 tablet 3   promethazine (PHENERGAN) 12.5 MG tablet Take 1 tablet (12.5 mg total) by mouth every 8 (eight) hours as needed for nausea or vomiting. 20 tablet 0   rosuvastatin (CRESTOR) 20 MG tablet Take 1 tablet (20 mg total) by mouth daily. 90 tablet 1   sertraline (ZOLOFT) 100 MG tablet TAKE 1/2 TABLET(50 MG) BY MOUTH EVERY DAY 45 tablet 3   SUMAtriptan (IMITREX) 100 MG tablet May repeat in 2 hours if headache persists(Plz sched with new provider) 12 tablet 2   tirzepatide (ZEPBOUND) 5 MG/0.5ML Pen Inject 5 mg into the skin once a week. 2 mL 3   vancomycin (VANCOCIN) 125 MG capsule Take 125 mg by mouth 4 (four) times daily. On taper     zolpidem (AMBIEN) 5 MG tablet Take 1 tablet (5 mg total) by mouth at bedtime as needed for sleep. 15 tablet 0   No current facility-administered medications for this visit.    Allergies as of 09/25/2023 - Review Complete 09/10/2023  Allergen Reaction Noted   Contrast media [iodinated contrast media] Hives and Itching 03/06/2016   Other Hives and Swelling 03/06/2016   Codeine Nausea Only    Erythromycin Nausea And Vomiting    Iohexol Hives 02/12/2014    Family History  Problem Relation Age of Onset   Hypertension Father    Vision loss Father    Alcohol abuse Father    Emphysema Father        Smoker   Hearing loss Father    Early death Father        Murdered   Hypertension Mother    Heart disease Mother    Hepatitis Mother        Hep C   Endometriosis Mother    Pancreatic disease Mother    Depression  Mother    Hearing loss Mother  Lupus Sister    Endometriosis Sister    Arthritis Sister    Depression Sister    Breast cancer Paternal Aunt        58's   Lung cancer Paternal Uncle        smoker   Colon cancer Maternal Grandfather    Uterine cancer Maternal Aunt        50's   Emphysema Paternal Aunt        Non-smoker/Died of Pneumonia   Lung cancer Paternal Uncle    Heart attack Maternal Uncle    Parkinson's disease Maternal Grandmother    Alcohol abuse Maternal Grandmother    Heart disease Paternal Grandmother        Died during angioplasty surgery   Aneurysm Paternal Grandfather    Allergy (severe) Son        ASA-Idiopathic Thrombocytopenia   Early death Maternal Uncle        Murdered   Lung disease Paternal Aunt        Bronchiecstasis   Lung cancer Paternal Uncle    Early death Paternal Uncle        Carbon Monoxide Poisoning/Accidental   Stomach cancer Neg Hx    Pancreatic cancer Neg Hx     Social History   Socioeconomic History   Marital status: Single    Spouse name: Not on file   Number of children: 2   Years of education: Not on file   Highest education level: Associate degree: occupational, Scientist, product/process development, or vocational program  Occupational History   Not on file  Tobacco Use   Smoking status: Never   Smokeless tobacco: Never  Vaping Use   Vaping status: Never Used  Substance and Sexual Activity   Alcohol use: Not Currently    Alcohol/week: 0.0 standard drinks of alcohol    Comment: socially   Drug use: No   Sexual activity: Not on file  Other Topics Concern   Not on file  Social History Narrative   Works at [employer]   Social Determinants of Health   Financial Resource Strain: Low Risk  (08/17/2023)   Received [facility]   Overall Financial Resource Strain (CARDIA)    Difficulty of Paying Living Expenses: Not hard at all  Recent Concern: Financial Resource Strain - Medium Risk (06/20/2023)   Overall Financial Resource  Strain (CARDIA)    Difficulty of Paying Living Expenses: Somewhat hard  Food Insecurity: Low Risk  (08/31/2023)   Received from [facility]   Hunger Vital Sign    Worried About Running Out of Food in the Last Year: Never true    Ran Out of Food in the Last Year: Never true  Transportation Needs: No Transportation Needs (08/31/2023)   Received from [facility]    In the past 12 months, has lack of reliable transportation kept you from medical appointments, meetings, work or from getting things needed for daily living? : No  Physical Activity: Unknown (06/20/2023)   Exercise Vital Sign    Days of Exercise per Week: 0 days    Minutes of Exercise per Session: Not on file  Stress: No Stress Concern Present (06/20/2023)   [employer] of Occupational Health - Occupational Stress Questionnaire    Feeling of Stress : Only a little  Social Connections: Socially Isolated (06/20/2023)   Social Connection and Isolation Panel [NHANES]    Frequency of Communication with Friends and Family: More than three times a week  Frequency of Social Gatherings with Friends and Family: Once a week    Attends Religious Services: Never    Database administrator or Organizations: No    Attends Engineer, structural: Not on file    Marital Status: Divorced  Intimate Partner Violence: Unknown (01/26/2022)   Received from [facility]   HITS    Physically Hurt: Not on file    Insult or Talk Down To: Not on file    Threaten Physical Harm: Not on file    Scream or Curse: Not on file    Review of Systems:    Constitutional: No weight loss, fever or chills Cardiovascular: No chest pain Respiratory: No SOB  Gastrointestinal: See HPI and otherwise negative   Physical Exam:  Vital signs: BP 104/68   Pulse 91   Ht 5\' 5"  (1.651 m)   Wt 178 lb 2 oz (80.8 kg)   LMP 04/03/2012 Comment: Still has Cervix and Ovaries  SpO2 99%   BMI 29.64 kg/m    Constitutional:    Pleasant Caucasian female appears to be in NAD, Well developed, Well nourished, alert and cooperative Respiratory: Respirations even and unlabored. Lungs clear to auscultation bilaterally.   No wheezes, crackles, or rhonchi.  Cardiovascular: Normal S1, S2. No MRG. Regular rate and rhythm. No peripheral edema, cyanosis or pallor.  Gastrointestinal:  Soft, nondistended, mild RLQ ttp. No rebound or guarding. Normal bowel sounds. No appreciable masses or hepatomegaly. Rectal:  Not performed.  Psychiatric: Oriented to person, place and time. Demonstrates good judgement and reason without abnormal affect or behaviors.  RELEVANT LABS AND IMAGING: CBC    Component Value Date/Time   WBC 7.9 09/10/2023 0920   RBC 5.18 (H) 09/10/2023 0920   HGB 14.9 09/10/2023 0920   HGB 13.5 02/01/2022 1151   HCT 44.7 09/10/2023 0920   HCT 40.6 02/01/2022 1151   PLT 430.0 (H) 09/10/2023 0920   PLT 359 02/01/2022 1151   MCV 86.3 09/10/2023 0920   MCV 88 02/01/2022 1151   MCH 29.5 05/22/2022 0337   MCHC 33.2 09/10/2023 0920   RDW 15.1 09/10/2023 0920   RDW 12.7 02/01/2022 1151   LYMPHSABS 2.1 09/10/2023 0920   LYMPHSABS 2.1 02/01/2022 1151   MONOABS 0.8 09/10/2023 0920   EOSABS 0.5 09/10/2023 0920   EOSABS 0.4 02/01/2022 1151   BASOSABS 0.0 09/10/2023 0920   BASOSABS 0.1 02/01/2022 1151    CMP     Component Value Date/Time   NA 136 09/10/2023 0920   NA 144 02/01/2022 1151   K 4.2 09/10/2023 0920   CL 98 09/10/2023 0920   CO2 28 09/10/2023 0920   GLUCOSE 95 09/10/2023 0920   BUN 11 09/10/2023 0920   BUN 8 02/01/2022 1151   CREATININE 0.99 09/10/2023 0920   CREATININE 0.80 05/30/2015 1204   CALCIUM 10.5 09/10/2023 0920   PROT 8.1 09/10/2023 0920   PROT 7.1 02/01/2022 1151   ALBUMIN 4.8 09/10/2023 0920   ALBUMIN 4.4 02/01/2022 1151   AST 16 09/10/2023 0920   ALT 12 09/10/2023 0920   ALKPHOS 51 09/10/2023 0920   BILITOT 0.6 09/10/2023 0920   BILITOT <0.2 02/01/2022 1151   GFRNONAA >60  05/22/2022 0337   GFRAA 93 02/26/2020 0825    Assessment: 1.  Recurrent C. difficile diarrhea: Patient has history of C. difficile recurrent see back in 2022 that culminated in fecal transplant, did well for a few years, but now has had 2 further hospitalizations in regards to  C. difficile and is currently on a prolonged taper of Vancomycin, thankfully her symptoms have abated, but she is nervous in regards to recurrence, does have history of lupus and interstitial lung disease 2.  Nausea and vomiting: Describes acute onset of nausea with vomiting almost every morning 1 time, then she feels better the rest of the day, was previously on Zofran which seemed to help, had not been taking this recently, extensive workup in the past with 2 hiatal hernia repairs, has also followed with [facility]; uncertain etiology 3.  History of hiatal hernia status postrepair x 2 followed by [facility]  Plan: 1.  Continue Vancomycin taper.  Discussed that if she has return of symptoms after this prolonged taper then she should proceed to [facility] where they could do a fecal transplant given ongoing symptoms and history of similar therapy in the past which worked well for her.  Unfortunately fecal transplant is unavailable at [facility] 2.  Discussed nausea and vomiting.  Has had extensive workup in the past including normal gastric emptying study, at this point would recommend that she trial a Zofran ODT 4 mg when she wakes in the morning to try and preempt this episode of vomiting 3.  Prescribed Zofran ODT 4 mg every morning #30 with 6 refills 4.  Patient scheduled for a follow-up with Dr. Rhea Wise as she is quite complex and has not seen him in some time.  This will be in the next 3 to 4 months.  Sooner if necessary.  Hyacinth Meeker, PA-C [facility] 09/25/2023, 11:07 AM  Cc: Gina Dana, NP

## 2023-10-11 NOTE — Progress Notes (Signed)
Addendum: Reviewed and agree with assessment and management plan. Kadijah Shamoon M, MD  

## 2023-10-14 ENCOUNTER — Other Ambulatory Visit: Payer: Self-pay | Admitting: Family Medicine

## 2023-10-18 ENCOUNTER — Encounter: Payer: Self-pay | Admitting: Family Medicine

## 2023-10-18 NOTE — Telephone Encounter (Signed)
Paperwork not received yet. Will hold until received.

## 2023-10-22 NOTE — Telephone Encounter (Signed)
Done. In your basket.

## 2023-10-28 ENCOUNTER — Telehealth: Payer: Self-pay | Admitting: Physician Assistant

## 2023-10-28 NOTE — Telephone Encounter (Signed)
 Gina Wise, pt is currently at [facility]. Will you review records in Care Everywhere and advise? Thanks

## 2023-10-28 NOTE — Telephone Encounter (Signed)
 Inbound call from patient, states she is currently hospitalized, she would like to discuss further steps. She states the nurses at the hospital are unsure of how to proceed and she would like to check back with our providers.

## 2023-10-28 NOTE — Telephone Encounter (Signed)
 Called and spoke with patient regarding Gina Wise's recommendations. Pt will inform inpatient team that they should place referral to [facility] for possible fecal transplant. Pt will call back if she needs any further assistance.

## 2023-10-31 ENCOUNTER — Telehealth: Payer: Self-pay

## 2023-10-31 DIAGNOSIS — A0472 Enterocolitis due to Clostridium difficile, not specified as recurrent: Secondary | ICD-10-CM

## 2023-10-31 NOTE — Telephone Encounter (Signed)
 Copied from CRM [id number]. Topic: Clinical - Prescription Issue >> Oct 31, 2023  1:06 PM Corean R wrote: Patient was prescribed Vancomycin  125 MG (56 capsules) at her most recent hospital visit but [facility] is charging $400 for this prescription  but [facility] at [address], [place], [place] [address] is charging [phone] - Patient states that the hospital can't transfer this is their transfer system is down and she would like to know if [facility] can do it for her.

## 2023-10-31 NOTE — Telephone Encounter (Signed)
**Note De-identified  [place] Obfuscation** Please advise 

## 2023-11-01 ENCOUNTER — Inpatient Hospital Stay: Payer: 59 | Admitting: Family Medicine

## 2023-11-01 MED ORDER — VANCOMYCIN HCL 125 MG PO CAPS: 125.0000 mg | ORAL_CAPSULE | Freq: Four times a day (QID) | ORAL | 0 refills | Status: AC

## 2023-11-01 NOTE — Telephone Encounter (Signed)
 LMOM informing Pt that Gina Wise has sent in the Vancomycin to [facility] for her.

## 2023-11-05 ENCOUNTER — Inpatient Hospital Stay: Payer: 59 | Admitting: Family Medicine

## 2023-11-07 ENCOUNTER — Telehealth: Payer: 59 | Admitting: Family Medicine

## 2023-11-07 ENCOUNTER — Encounter: Payer: Self-pay | Admitting: Family Medicine

## 2023-11-07 ENCOUNTER — Telehealth: Payer: Self-pay | Admitting: *Deleted

## 2023-11-07 ENCOUNTER — Other Ambulatory Visit (INDEPENDENT_AMBULATORY_CARE_PROVIDER_SITE_OTHER): Payer: 59

## 2023-11-07 DIAGNOSIS — Z09 Encounter for follow-up examination after completed treatment for conditions other than malignant neoplasm: Secondary | ICD-10-CM

## 2023-11-07 DIAGNOSIS — A0472 Enterocolitis due to Clostridium difficile, not specified as recurrent: Secondary | ICD-10-CM | POA: Diagnosis not present

## 2023-11-07 DIAGNOSIS — R197 Diarrhea, unspecified: Secondary | ICD-10-CM

## 2023-11-07 DIAGNOSIS — E039 Hypothyroidism, unspecified: Secondary | ICD-10-CM | POA: Diagnosis not present

## 2023-11-07 NOTE — Telephone Encounter (Signed)
this pt returned her stool sample today. the only order I see is for c diff and pt returned an orange cap vial as well which we usually use for stool cx.... is any other stool testing needed other than c. Diff?  35 mins TB Clayborne Dana, NP C.diff was most important. If we're able to add a regular stool culture as well that's fine

## 2023-11-07 NOTE — Progress Notes (Signed)
Virtual Video Visit via MyChart Note  I connected with  Gina Wise on 11/07/23 at 10:20 AM EST by the video enabled telemedicine application for MyChart, and verified that I am speaking with the correct person using two identifiers.   I introduced myself as a Publishing rights manager with the practice. We discussed the limitations of evaluation and management by telemedicine and the availability of in person appointments. The patient expressed understanding and agreed to proceed.  Participating parties in this visit include: The patient and the nurse practitioner listed.  The patient is: At home I am: In the office - [facility] at [facility]  Subjective:    CC:  Chief Complaint  Patient presents with   Hospitalization Follow-up    HPI: Gina Wise is a 63 y.o. year old female presenting today via MyChart today for hospital follow-up.   Discussed the use of AI scribe software for clinical note transcription with the patient, who gave verbal consent to proceed.  History of Present Illness   The patient, with a history of recurrent Clostridium difficile (C. diff) infections, presents with ongoing symptoms of nausea, vomiting, and diarrhea. They were recently hospitalized from the 3rd to the 6th at [facility] due to a suspected C. diff flare, during which they were treated with Flagyl and vancomycin. However, no stool sample was collected for testing during this admission due to cessation of symptoms with treatment.  Since discharge, the patient has been experiencing persistent nausea and vomiting, with diarrhea slowing down. They have been taking vancomycin for the past four days, but it has not consistently stayed down due to vomiting. The patient also reports elevated blood pressure and heart rate, which they believe increases post-vomiting episodes.  The patient has an upcoming appointment with a gastroenterologist at [facility] to discuss a potential fecal  transplant. They have previously undergone a fecal transplant, which provided relief for approximately two years until a recent COVID-19 infection triggered their current symptoms.  The patient also reports a history of low potassium levels, with the most recent reading at 3.2, and has been attempting to maintain hydration and electrolyte balance with core water and orange juice. However, they are struggling with fluid intake due to ongoing nausea.  The patient's condition has significantly impacted their quality of life, leading to multiple hospital stays and inability to return to work. They express a strong desire for a resolution to their ongoing health issues.            Past medical history, Surgical history, Family history not pertinant except as noted below, Social history, Allergies, and medications have been entered into the medical record, reviewed, and corrections made.   Review of Systems:  All review of systems negative except what is listed in the HPI   Objective:    General:  Speaking clearly in complete sentences. Absent shortness of breath noted.   Alert and oriented x3.   Normal judgment.  Absent acute distress.   Impression and Recommendations:    Problem List Items Addressed This Visit       Active Problems   Clostridium difficile diarrhea - Primary   Relevant Orders   Basic metabolic panel   Magnesium   CBC with Differential/Platelet      Persistent symptoms despite treatment with Vancomycin. Patient has been unable to consistently keep medication down due to vomiting. Patient has a history of fecal transplant with good response. Appointment scheduled with GI specialist at [facility] for  potential repeat fecal transplant. -Continue Vancomycin as tolerated.  Continue supportive measures -Attend GI appointment on 11/14/2023. -Patient aware of signs/symptoms requiring further/urgent evaluation Low potassium noted on recent labs, likely secondary to fluid  loss from vomiting and diarrhea. Patient is attempting to rehydrate with electrolyte-infused water. -Check electrolytes when patient is able to come to the clinic. Orders place.  -Continue electrolyte drinks as tolerated.          Follow-up if symptoms worsen or fail to improve.    I discussed the assessment and treatment plan with the patient. The patient was provided an opportunity to ask questions and all were answered. The patient agreed with the plan and demonstrated an understanding of the instructions.   The patient was advised to call back or seek an in-person evaluation if the symptoms worsen or if the condition fails to improve as anticipated.   Clayborne Dana, NP

## 2023-11-08 ENCOUNTER — Encounter: Payer: Self-pay | Admitting: Family Medicine

## 2023-11-08 DIAGNOSIS — R7989 Other specified abnormal findings of blood chemistry: Secondary | ICD-10-CM

## 2023-11-08 DIAGNOSIS — Z8619 Personal history of other infectious and parasitic diseases: Secondary | ICD-10-CM

## 2023-11-08 LAB — BASIC METABOLIC PANEL
BUN: 16 mg/dL (ref 6–23)
CO2: 27 meq/L (ref 19–32)
Calcium: 10.4 mg/dL (ref 8.4–10.5)
Chloride: 98 meq/L (ref 96–112)
Creatinine, Ser: 1.04 mg/dL (ref 0.40–1.20)
GFR: 57.93 mL/min — ABNORMAL LOW (ref >60.00)
Glucose, Bld: 99 mg/dL (ref 70–99)
Potassium: 3.9 meq/L (ref 3.5–5.1)
Sodium: 140 meq/L (ref 135–145)

## 2023-11-08 LAB — CBC WITH DIFFERENTIAL/PLATELET
Basophils Absolute: 0.1 10*3/uL (ref 0.0–0.1)
Basophils Relative: 0.4 % (ref 0.0–3.0)
Eosinophils Absolute: 0.2 10*3/uL (ref 0.0–0.7)
Eosinophils Relative: 1.2 % (ref 0.0–5.0)
HCT: 45.6 % (ref 36.0–46.0)
Hemoglobin: 15.2 g/dL — ABNORMAL HIGH (ref 12.0–15.0)
Lymphocytes Relative: 16.8 % (ref 12.0–46.0)
Lymphs Abs: 2.4 10*3/uL (ref 0.7–4.0)
MCHC: 33.3 g/dL (ref 30.0–36.0)
MCV: 87.2 fL (ref 78.0–100.0)
Monocytes Absolute: 1 10*3/uL (ref 0.1–1.0)
Monocytes Relative: 6.9 % (ref 3.0–12.0)
Neutro Abs: 10.7 10*3/uL — ABNORMAL HIGH (ref 1.4–7.7)
Neutrophils Relative %: 74.7 % (ref 43.0–77.0)
Platelets: 455 10*3/uL — ABNORMAL HIGH (ref 150.0–400.0)
RBC: 5.23 Mil/uL — ABNORMAL HIGH (ref 3.87–5.11)
RDW: 15.1 % (ref 11.5–15.5)
WBC: 14.3 10*3/uL — ABNORMAL HIGH (ref 4.0–10.5)

## 2023-11-08 LAB — MAGNESIUM: Magnesium: 2.3 mg/dL (ref 1.5–2.5)

## 2023-11-08 LAB — TSH: TSH: 3.56 u[IU]/mL (ref 0.35–5.50)

## 2023-11-08 LAB — CLOSTRIDIUM DIFFICILE BY PCR: Toxigenic C. Difficile by PCR: NEGATIVE

## 2023-11-11 LAB — SALMONELLA/SHIGELLA CULT, CAMPY EIA AND SHIGA TOXIN RFL ECOLI
MICRO NUMBER: 15964597
MICRO NUMBER:: 15964598
MICRO NUMBER:: 15964599
Result:: NOT DETECTED
SHIGA RESULT:: NOT DETECTED
SPECIMEN QUALITY: ADEQUATE
SPECIMEN QUALITY:: ADEQUATE
SPECIMEN QUALITY:: ADEQUATE

## 2023-11-11 NOTE — Telephone Encounter (Signed)
See message.

## 2023-11-12 ENCOUNTER — Other Ambulatory Visit: Payer: 59

## 2023-11-12 NOTE — Addendum Note (Signed)
Addended by: Hyman Hopes B on: 11/12/2023 07:41 AM   Modules accepted: Orders

## 2023-11-14 ENCOUNTER — Encounter: Payer: Self-pay | Admitting: Family Medicine

## 2023-11-14 ENCOUNTER — Other Ambulatory Visit (INDEPENDENT_AMBULATORY_CARE_PROVIDER_SITE_OTHER): Payer: 59

## 2023-11-14 DIAGNOSIS — R7989 Other specified abnormal findings of blood chemistry: Secondary | ICD-10-CM | POA: Diagnosis not present

## 2023-11-14 DIAGNOSIS — Z8619 Personal history of other infectious and parasitic diseases: Secondary | ICD-10-CM

## 2023-11-14 DIAGNOSIS — R768 Other specified abnormal immunological findings in serum: Secondary | ICD-10-CM

## 2023-11-14 LAB — CBC WITH DIFFERENTIAL/PLATELET
Basophils Absolute: 0 10*3/uL (ref 0.0–0.1)
Basophils Relative: 0.4 % (ref 0.0–3.0)
Eosinophils Absolute: 0.3 10*3/uL (ref 0.0–0.7)
Eosinophils Relative: 3.5 % (ref 0.0–5.0)
HCT: 41.6 % (ref 36.0–46.0)
Hemoglobin: 14.1 g/dL (ref 12.0–15.0)
Lymphocytes Relative: 28.4 % (ref 12.0–46.0)
Lymphs Abs: 2.1 10*3/uL (ref 0.7–4.0)
MCHC: 33.9 g/dL (ref 30.0–36.0)
MCV: 85.9 fL (ref 78.0–100.0)
Monocytes Absolute: 0.6 10*3/uL (ref 0.1–1.0)
Monocytes Relative: 7.7 % (ref 3.0–12.0)
Neutro Abs: 4.4 10*3/uL (ref 1.4–7.7)
Neutrophils Relative %: 60 % (ref 43.0–77.0)
Platelets: 359 10*3/uL (ref 150.0–400.0)
RBC: 4.84 Mil/uL (ref 3.87–5.11)
RDW: 14.8 % (ref 11.5–15.5)
WBC: 7.3 10*3/uL (ref 4.0–10.5)

## 2023-11-17 LAB — ANA: Anti Nuclear Antibody (ANA): POSITIVE — AB

## 2023-11-17 LAB — ANTI-NUCLEAR AB-TITER (ANA TITER): ANA Titer 1: 1:1280 {titer} — ABNORMAL HIGH

## 2023-11-18 ENCOUNTER — Encounter: Payer: Self-pay | Admitting: Internal Medicine

## 2023-11-18 NOTE — Addendum Note (Signed)
Addended by: Hyman Hopes B on: 11/18/2023 07:52 AM   Modules accepted: Orders

## 2023-11-19 ENCOUNTER — Other Ambulatory Visit (HOSPITAL_BASED_OUTPATIENT_CLINIC_OR_DEPARTMENT_OTHER): Payer: Self-pay

## 2023-11-20 ENCOUNTER — Other Ambulatory Visit (HOSPITAL_BASED_OUTPATIENT_CLINIC_OR_DEPARTMENT_OTHER): Payer: Self-pay

## 2023-11-21 ENCOUNTER — Other Ambulatory Visit (HOSPITAL_BASED_OUTPATIENT_CLINIC_OR_DEPARTMENT_OTHER): Payer: Self-pay

## 2023-11-22 ENCOUNTER — Other Ambulatory Visit (HOSPITAL_BASED_OUTPATIENT_CLINIC_OR_DEPARTMENT_OTHER): Payer: Self-pay

## 2023-11-30 IMAGING — MG DIGITAL DIAGNOSTIC BILAT W/ TOMO W/ CAD
6 of 10 series · 6 of 30 positions shown · non-contrast
Comparison: Previous exam(s).

CLINICAL DATA: 60-year-old female with a palpable right axillary
lump for 6 months after recent weight loss.

EXAM:
DIGITAL DIAGNOSTIC BILATERAL MAMMOGRAM WITH TOMOSYNTHESIS AND CAD;
ULTRASOUND RIGHT BREAST LIMITED
TECHNIQUE: Bilateral digital diagnostic mammography and breast tomosynthesis
was performed. The images were evaluated with computer-aided
detection.; Targeted ultrasound examination of the right breast was
performed

[R TAN synth-2D]
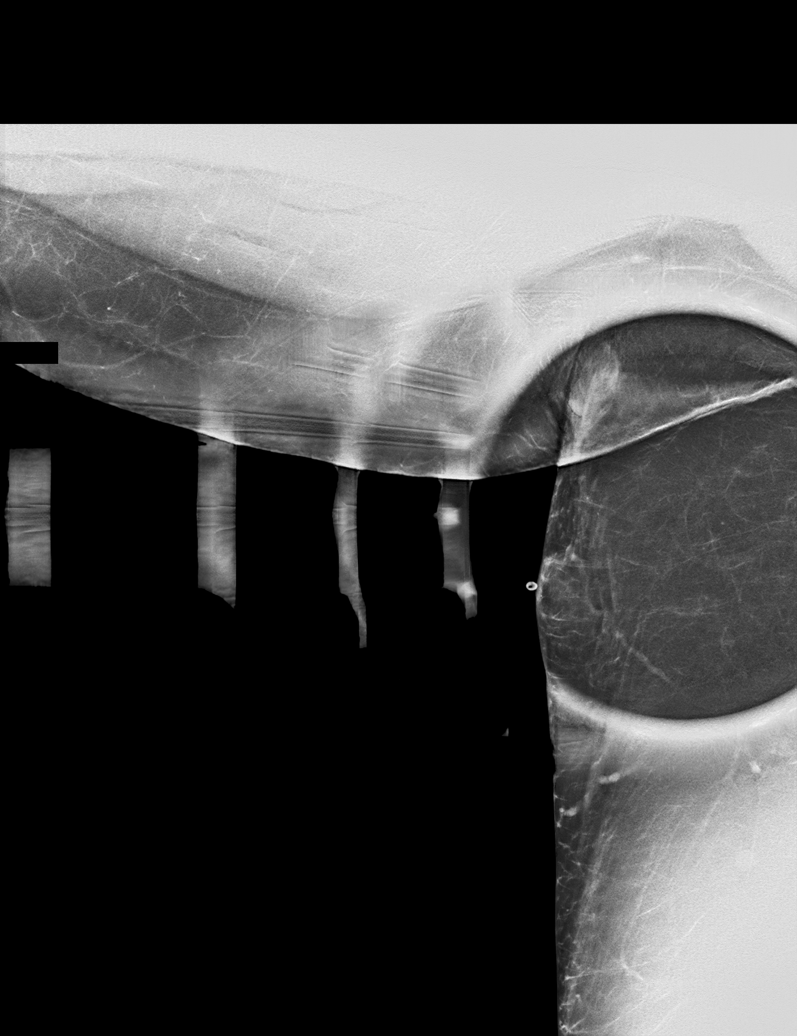

[R MLO synth-2D]
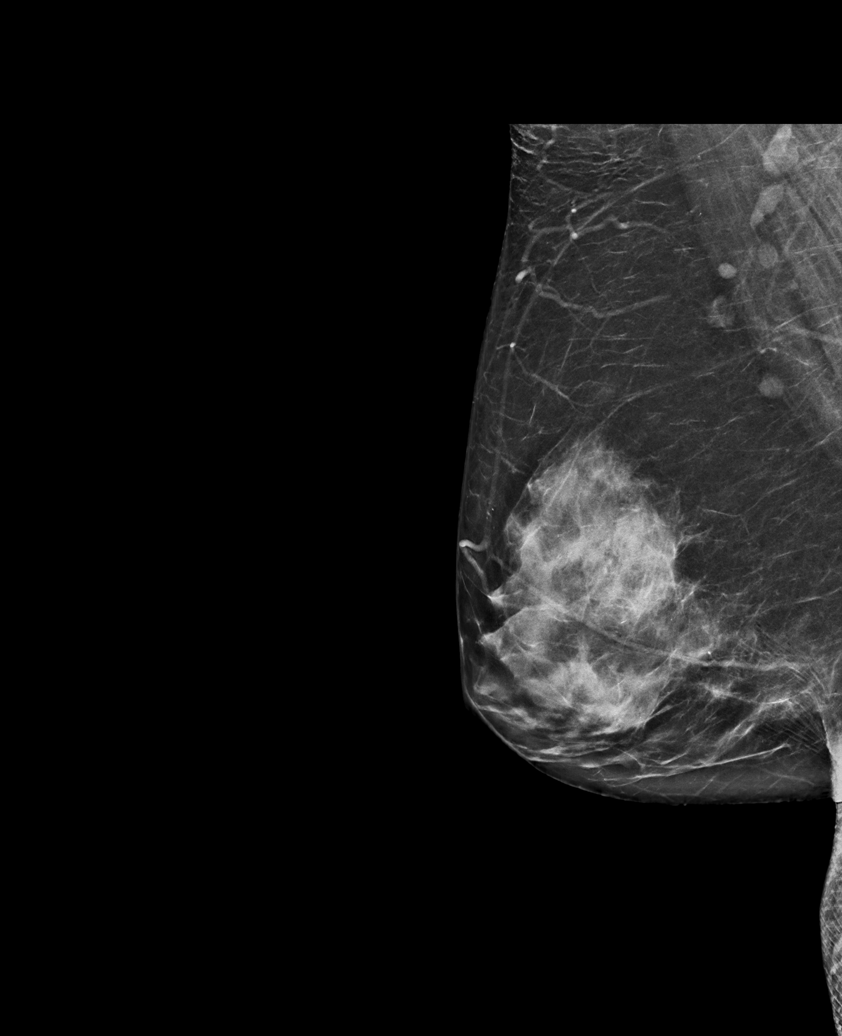

[L CC synth-2D]
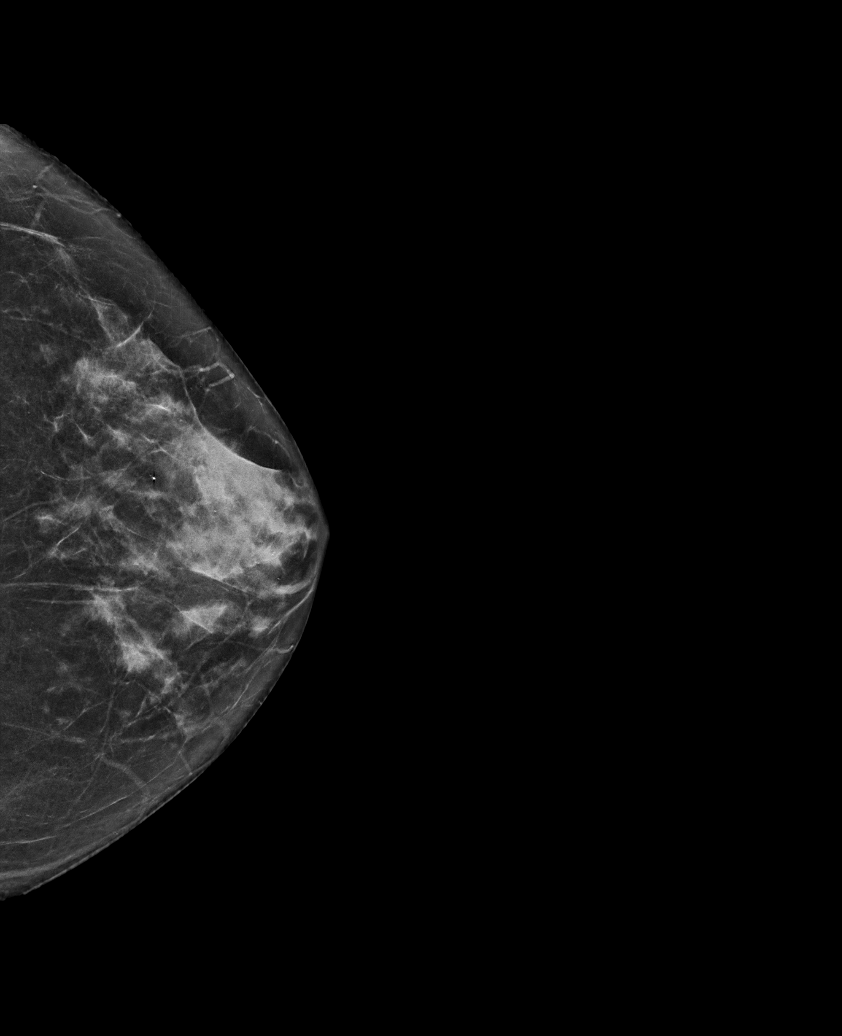

[L MLO synth-2D]
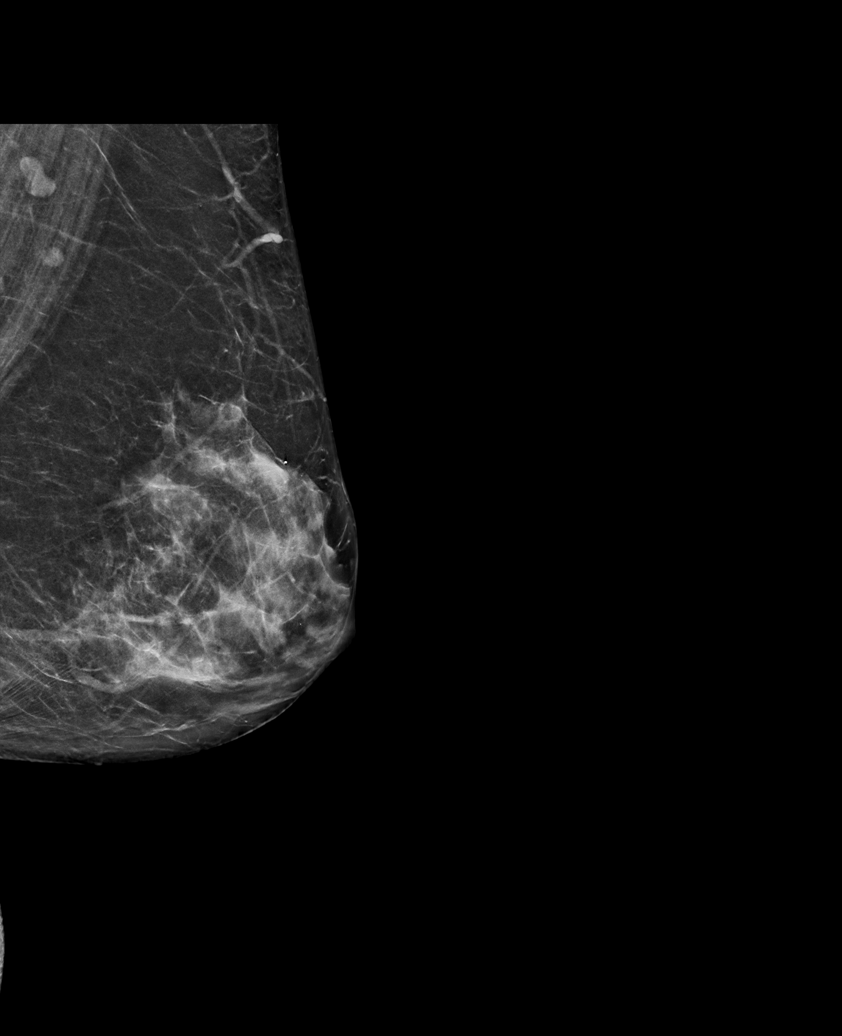

[R CC synth-2D]
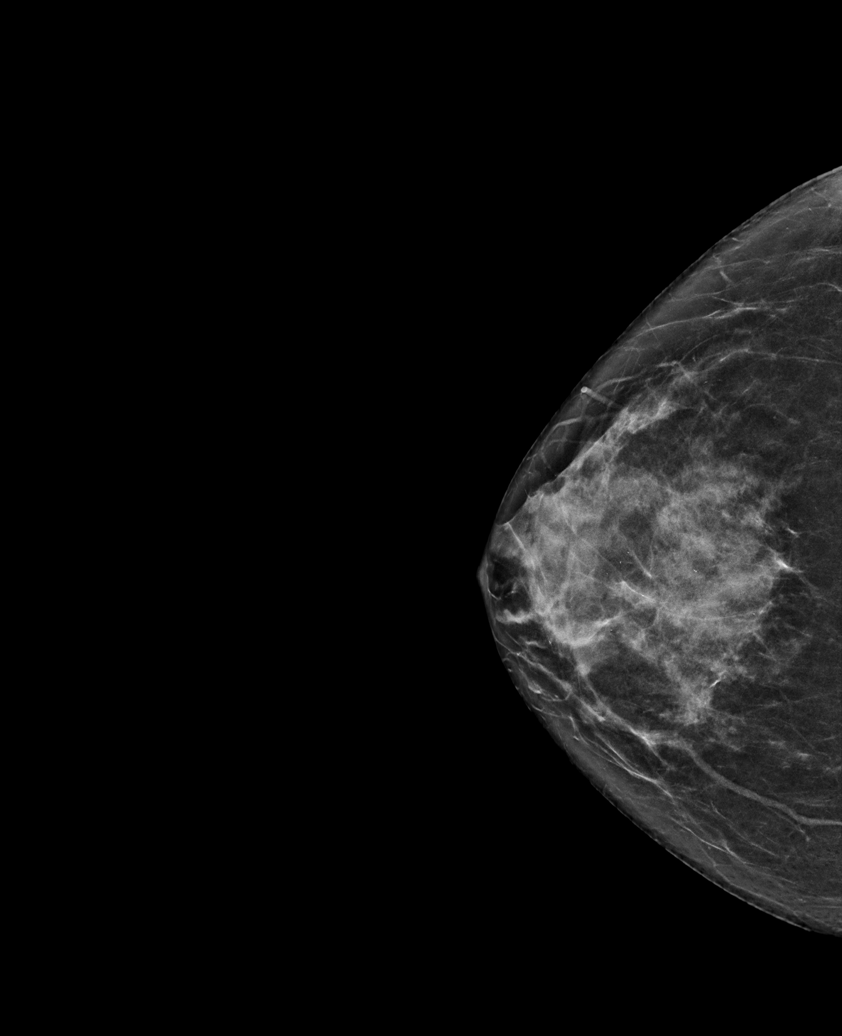

[R TAN tomo · tomo slice 30/59.0]
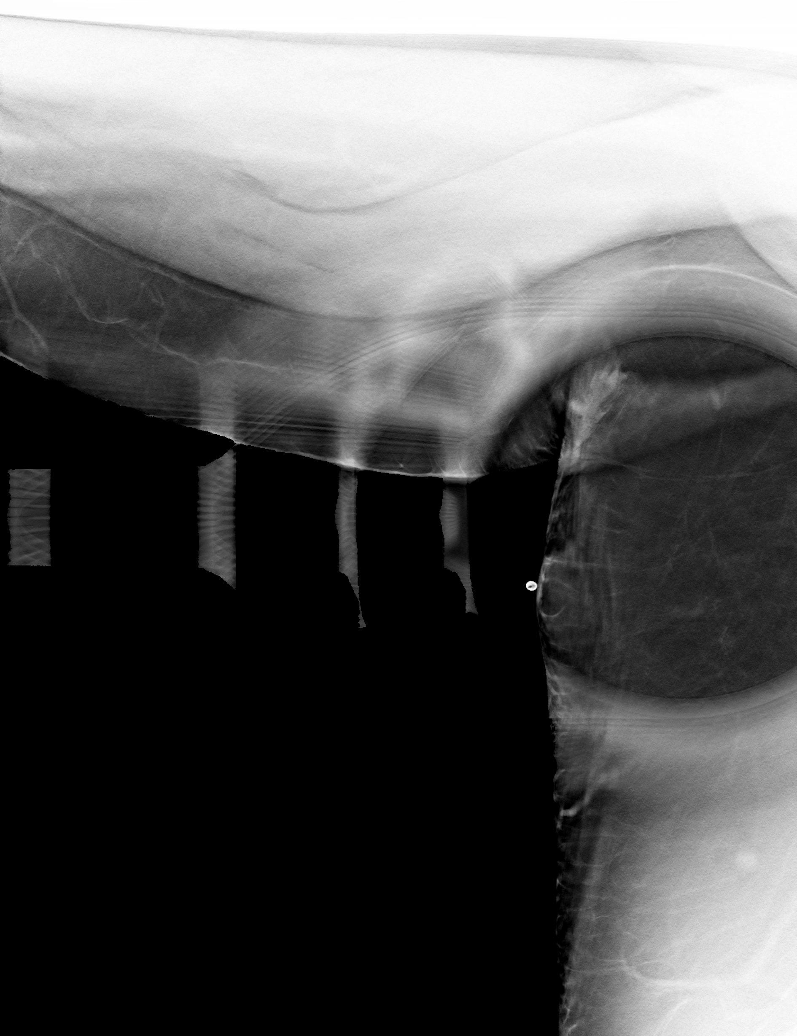

[6 of 30 positions shown; findings below may reference images not displayed]

ACR Breast Density Category c: The breast tissue is heterogeneously
dense, which may obscure small masses.
FINDINGS: A radiopaque BB was placed at the site of the patient's palpable
lump in the low right axilla. No focal or suspicious mammographic
findings are seen deep to the radiopaque BB. No suspicious findings
are identified in either breast. The parenchymal pattern is stable.

Targeted ultrasound is performed, showing no focal or suspicious
sonographic abnormality in the low right axilla.
IMPRESSION: 1. No mammographic evidence of malignancy in either breast.
2. Unremarkable ultrasound evaluation of the right axilla.

RECOMMENDATION:
1. Clinical follow-up recommended for the palpable area of concern
in the right axilla. Any further workup should be based on clinical
grounds.
2.  Screening mammogram in one year.(Code:[id number]-[id number])

I have discussed the findings and recommendations with the patient.
If applicable, a reminder letter will be sent to the patient
regarding the next appointment.

BI-RADS CATEGORY  1: Negative.

## 2023-12-19 ENCOUNTER — Ambulatory Visit: Payer: 59 | Admitting: Family Medicine

## 2023-12-19 ENCOUNTER — Other Ambulatory Visit (HOSPITAL_BASED_OUTPATIENT_CLINIC_OR_DEPARTMENT_OTHER): Payer: Self-pay

## 2023-12-19 VITALS — BP 127/81 | HR 86 | Ht 65.0 in | Wt 159.0 lb

## 2023-12-19 DIAGNOSIS — A0472 Enterocolitis due to Clostridium difficile, not specified as recurrent: Secondary | ICD-10-CM | POA: Diagnosis not present

## 2023-12-19 DIAGNOSIS — Z6831 Body mass index (BMI) 31.0-31.9, adult: Secondary | ICD-10-CM

## 2023-12-19 MED ORDER — ZEPBOUND 5 MG/0.5ML ~~LOC~~ SOAJ
5.0000 mg | SUBCUTANEOUS | 3 refills | Status: DC
  Filled 2023-12-19: qty 2, 28d supply, fill #0

## 2023-12-19 NOTE — Assessment & Plan Note (Signed)
 On Zepbound 5mg , reports slowed effectiveness. 19 pounds away from goal weight. -Continue Zepbound 5mg . Hesitant to increase right now due to GI history.  -Reevaluate in one month for possible dose adjustment.

## 2023-12-19 NOTE — Progress Notes (Signed)
 Established Patient Office Visit      Subjective   Patient ID: Gina Wise, female    DOB: [birth year]  Age: 63 y.o. MRN: [id number]  Chief Complaint  Patient presents with   Medical Management of Chronic Issues    HPI  Discussed the use of AI scribe software for clinical note transcription with the patient, who gave verbal consent to proceed.  History of Present Illness Gina Wise is a 63 year old female with recurrent Clostridioides difficile infection who presents for follow-up after recent treatment.   She completed her treatment for Clostridioides difficile infection on December 08, 2023, and experienced a brief recurrence of symptoms three days post-treatment, which resolved within 24 hours. She underwent a novel treatment involving Vowst,  which she took over three days. The treatment involved taking four pills daily on an empty stomach, causing mild nausea managed with Zofran. Since completing the treatment, no further diarrhea or vomiting has occurred.  She has been experiencing some gastrointestinal discomfort, including a sensation of queasiness and occasional constipation. Constipation is managed with increased water intake, and MiraLax is available if needed. Bowel movements are now infrequent and consist of hard stools. No current diarrhea or vomiting, but she feels wobbly and occasionally sick to her stomach.  She has been on Zepbound, a medication taken intermittently due to hospitalizations, and reports a weight loss of 19 pounds since December, attributing it partially to Zepbound. She is currently on a 5 mg dose and is considering whether to increase the dose due to a perceived decrease in effectiveness. Nausea is experienced, which she wonders might be related to Zepbound.  Her energy levels are gradually improving, although she still feels wobbly and occasionally sick to her stomach. She monitors her blood pressure regularly and maintains a diet rich in  potassium, magnesium, and calcium, supported by her son. She has a history of recurrent hospitalizations since September 2024, often related to her gastrointestinal issues. She has been out of work more than she has been present due to these health challenges. She is now feeling better and is able to engage in activities such as attending concerts and taking long walks.       Wt Readings from Last 3 Encounters:  12/19/23 159 lb (72.1 kg)  09/25/23 178 lb 2 oz (80.8 kg)  09/10/23 178 lb (80.7 kg)        ROS All review of systems negative except what is listed in the HPI    Objective:     BP 127/81   Pulse 86   Ht 5\' 5"  (1.651 m)   Wt 159 lb (72.1 kg)   LMP 04/03/2012 Comment: Still has Cervix and Ovaries  SpO2 98%   BMI 26.46 kg/m    Physical Exam Vitals reviewed.  Constitutional:      General: She is not in acute distress.    Appearance: Normal appearance. She is not ill-appearing.  Cardiovascular:     Rate and Rhythm: Normal rate and regular rhythm.  Pulmonary:     Effort: Pulmonary effort is normal.     Breath sounds: Normal breath sounds.  Skin:    General: Skin is warm and dry.  Neurological:     Mental Status: She is alert and oriented to person, place, and time.  Psychiatric:        Mood and Affect: Mood normal.        Behavior: Behavior normal.        Thought Content:  Thought content normal.        Judgment: Judgment normal.      No results found for any visits on 12/19/23.    The 10-year ASCVD risk score (Arnett DK, et al., 2019) is: 4.8%    Assessment & Plan:   Problem List Items Addressed This Visit       Active Problems   BMI 31.0-31.9,adult - Primary   On Zepbound 5mg , reports slowed effectiveness. 19 pounds away from goal weight. -Continue Zepbound 5mg . Hesitant to increase right now due to GI history.  -Reevaluate in one month for possible dose adjustment.       Relevant Medications   tirzepatide (ZEPBOUND) 5 MG/0.5ML Pen    Clostridium difficile diarrhea   Recently completed treatment with Vowst. Initial recurrence of symptoms, but resolved spontaneously. Currently asymptomatic. -Continue current management. Constipation Reports infrequent, hard stools. Currently managing with increased fluid intake and MiraLax as needed. -Increase dietary fiber intake. -Consider daily fiber supplement such as Benefiber or Metamucil. -Consider probiotic supplementation.         Return in about 3 months (around 03/17/2024) for routine follow-up.    Clayborne Dana, NP

## 2023-12-19 NOTE — Assessment & Plan Note (Signed)
 Recently completed treatment with Vowst. Initial recurrence of symptoms, but resolved spontaneously. Currently asymptomatic. -Continue current management. Constipation Reports infrequent, hard stools. Currently managing with increased fluid intake and MiraLax as needed. -Increase dietary fiber intake. -Consider daily fiber supplement such as Benefiber or Metamucil. -Consider probiotic supplementation.

## 2024-01-10 ENCOUNTER — Other Ambulatory Visit: Payer: Self-pay | Admitting: Neurology

## 2024-01-10 DIAGNOSIS — G43909 Migraine, unspecified, not intractable, without status migrainosus: Secondary | ICD-10-CM

## 2024-01-10 MED ORDER — SUMATRIPTAN SUCCINATE 100 MG PO TABS: ORAL_TABLET | ORAL | 2 refills | Status: AC

## 2024-01-14 ENCOUNTER — Telehealth: Payer: Self-pay

## 2024-01-14 ENCOUNTER — Encounter: Payer: Self-pay | Admitting: Family Medicine

## 2024-01-14 DIAGNOSIS — E66811 Obesity, class 1: Secondary | ICD-10-CM

## 2024-01-14 MED ORDER — ZEPBOUND 7.5 MG/0.5ML ~~LOC~~ SOAJ: 7.5000 mg | SUBCUTANEOUS | 3 refills | Status: DC

## 2024-01-14 NOTE — Telephone Encounter (Signed)
 Pharmacy Patient Advocate Encounter   Received notification from CoverMyMeds that prior authorization for  Zepbound 7.5MG /0.5ML pen-injectors is required/requested.   Insurance verification completed.   The patient is insured through [facility] .   Per test claim: PA required; PA submitted to above mentioned insurance via CoverMyMeds Key/confirmation #/[id number] Status is pending

## 2024-01-16 ENCOUNTER — Other Ambulatory Visit (HOSPITAL_BASED_OUTPATIENT_CLINIC_OR_DEPARTMENT_OTHER): Payer: Self-pay

## 2024-01-16 ENCOUNTER — Other Ambulatory Visit (HOSPITAL_COMMUNITY): Payer: Self-pay

## 2024-01-16 MED ORDER — ZEPBOUND 7.5 MG/0.5ML ~~LOC~~ SOAJ
7.5000 mg | SUBCUTANEOUS | 3 refills | Status: DC
  Filled 2024-01-16: qty 2, 28d supply, fill #0

## 2024-01-16 NOTE — Addendum Note (Signed)
 Addended bySilvio Pate on: 01/16/2024 11:49 AM   Modules accepted: Orders

## 2024-01-16 NOTE — Telephone Encounter (Signed)
 Pharmacy Patient Advocate Encounter  Received notification from [facility] that Prior Authorization for Zepbound 7.5MG /0.5ML pen-injectors has been APPROVED from 01/14/24 to 07/16/24. Ran test claim, Copay is $24.99. This test claim was processed through [facility]- copay amounts may vary at other pharmacies due to pharmacy/plan contracts, or as the patient moves through the different stages of their insurance plan.   PA #/Case ID/Reference #: [id number]

## 2024-02-05 ENCOUNTER — Telehealth: Payer: Self-pay | Admitting: Family Medicine

## 2024-02-05 NOTE — Telephone Encounter (Signed)
 We did receive disability forms yesterday from [employer] on patient. We have had no correspondence from patient but it looks like recently hospitalized in [facility]. Please advise if we would complete forms?

## 2024-02-05 NOTE — Telephone Encounter (Signed)
 Mychart message sent to patient about forms.

## 2024-02-05 NOTE — Telephone Encounter (Signed)
 Copied from CRM [id number]. Topic: General - Other >> Feb 05, 2024  1:55 PM Deaijah H wrote: Reason for CRM: [employer] called in to check on disability paperwork that was faxed received 4/15. Callback number [phone].

## 2024-02-05 NOTE — Telephone Encounter (Signed)
 We can, but will need to communicate with her regarding what to put on the forms. She may be able to get better guidance from her GI doctor given reason for admission, but we can help if needed.

## 2024-02-12 NOTE — Telephone Encounter (Signed)
 Ugh that's rough! Yes, fine to fill out forms and we can just follow-up after discharge. Thanks!

## 2024-02-13 ENCOUNTER — Inpatient Hospital Stay: Admitting: Family Medicine

## 2024-02-20 ENCOUNTER — Inpatient Hospital Stay: Admitting: Family Medicine

## 2024-02-21 NOTE — Telephone Encounter (Signed)
 Forms completed and faxed to [employer] for return date of 03/06/2024.

## 2024-02-25 ENCOUNTER — Encounter: Payer: Self-pay | Admitting: Family Medicine

## 2024-02-25 ENCOUNTER — Ambulatory Visit: Admitting: Family Medicine

## 2024-02-25 VITALS — BP 129/87 | HR 99 | Ht 65.0 in | Wt 146.0 lb

## 2024-02-25 DIAGNOSIS — Z09 Encounter for follow-up examination after completed treatment for conditions other than malignant neoplasm: Secondary | ICD-10-CM

## 2024-02-25 DIAGNOSIS — K2970 Gastritis, unspecified, without bleeding: Secondary | ICD-10-CM

## 2024-02-25 DIAGNOSIS — K311 Adult hypertrophic pyloric stenosis: Secondary | ICD-10-CM | POA: Diagnosis not present

## 2024-02-25 NOTE — Progress Notes (Signed)
 Acute Office Visit  Subjective:     Patient ID: Gina Wise, female    DOB: 1961/09/03, 63 y.o.   MRN: [id number]  Chief Complaint  Patient presents with   Hospitalization Follow-up    HPI Patient is in today for hospital follow-up.    Discussed the use of AI scribe software for clinical note transcription with the patient, who gave verbal consent to proceed.  History of Present Illness Gina Wise is a 63 year old female with pyloric stenosis who presents with ongoing gastrointestinal issues and recent hospitalizations.   She has been hospitalized three times since April 11th due to gastrointestinal issues. Initially, she was diagnosed with colitis and hospitalized for five days. During a subsequent hospitalization from April 22nd to 24th, pyloric stenosis was identified and treated with dilation. However, her symptoms recurred, leading to another five-day hospitalization starting April 30th.  She feels very weak and wobbly since returning home. She experiences a sensation of something sitting in her throat, which she attributes to esophageal irritation. This sensation persisted after her recent procedure, where she was unable to keep anything down initially. She describes the sensation as a 'globus effect' and feels it even when not swallowing.  Her recent biopsy results showed negative for H. pylori, dysplasia, carcinoma, and but indicated gastritis, ulceration, intestinal metaplasia, and necroinflammatory debris.  Her diet has been modified to include only liquids or very soft foods, avoiding raw fruits and vegetables. She has lost 13 pounds since the end of February and has not been using Zepbound  to avoid affecting her gastrointestinal system.  She reports diarrhea, which she perceives as a positive sign that her stomach is not blocked. No urinary issues, belly pain, blood in stool or urine, or fevers. She feels very weak, describing it as feeling like she has been 'hit by a  truck'.  She has another dilation scheduled for June 16th. She is concerned about the frequency of these procedures and the potential for tissue changes leading to more serious conditions.  She has a history of Sjogren's syndrome, which she believes may be contributing to her current gastrointestinal issues.  She works from home and is primarily sedentary. Her son assists her with daily activities and transportation.  She is scheduled for follow-up with her primary GI provider at [facility] in 2 weeks. She is hoping to return to work-from-home job next week.      02/20/2024 [facility]: A. STOMACH, PYLORUS, BIOPSY:              Acute gastritis with multifocal intestinal metaplasia.               Negative for Helicobacter pylori organisms on routine H&E and immunostain.               Negative for dysplasia and carcinoma.    B. STOMACH, RANDOM, BIOPSY:              Acute gastritis with focal ulceration and necroinflammatory debris.               Negative for Helicobacter pylori organisms on routine H&E and immunostain.               Negative for intestinal metaplasia, dysplasia and carcinoma.     ROS All review of systems negative except what is listed in the HPI      Objective:    BP 129/87   Pulse 99   Ht 5\' 5"  (1.651 m)   Wt 146  lb (66.2 kg)   LMP 04/03/2012 Comment: Still has Cervix and Ovaries  SpO2 97%   BMI 24.30 kg/m    Physical Exam Vitals reviewed.  Constitutional:      General: She is not in acute distress.    Appearance: Normal appearance. She is not ill-appearing.  Cardiovascular:     Rate and Rhythm: Normal rate and regular rhythm.  Pulmonary:     Effort: Pulmonary effort is normal.     Breath sounds: Normal breath sounds.  Abdominal:     General: Bowel sounds are normal. There is no distension.     Palpations: Abdomen is soft. There is no mass.     Tenderness: There is no abdominal tenderness. There is no guarding.  Skin:    General: Skin is warm and dry.   Neurological:     Mental Status: She is alert and oriented to person, place, and time.  Psychiatric:        Mood and Affect: Mood normal.        Behavior: Behavior normal.        Thought Content: Thought content normal.        Judgment: Judgment normal.     No results found for any visits on 02/25/24.      Assessment & Plan:   Problem List Items Addressed This Visit   None Visit Diagnoses       Hospital discharge follow-up    -  Primary     Gastritis, presence of bleeding unspecified, unspecified chronicity, unspecified gastritis type         Pyloric stenosis         Pyloric stenosis Recurrent pyloric stenosis with persistent obstruction symptoms post-dilation. Concerns about repeated dilations and potential surgery. Possible link to Sjogren's syndrome causing fibrosis. - Await GI specialist input on May 20th. - Scheduled for another dilation on June 16th. - Avoid using Tirzepatide  for now.  Gastritis with ulceration Recent gastritis with ulceration, biopsy negative for H. pylori, dysplasia, and carcinoma. Concerns about chronic inflammation leading to precancerous changes. - Consume liquids and very soft foods. - Chew food thoroughly. - Await GI specialist input on May 20th.  Sjogren's syndrome Suspected contribution to pyloric stenosis via fibrosis.  - Discuss with GI specialist on May 20th. - Consider rheumatology follow-up.      Patient aware of signs/symptoms requiring further/urgent evaluation.       No orders of the defined types were placed in this encounter.   Return if symptoms worsen or fail to improve.  Everlina Hock, NP

## 2024-02-26 ENCOUNTER — Encounter: Payer: Self-pay | Admitting: Family Medicine

## 2024-02-26 NOTE — Telephone Encounter (Signed)
 Pended zofrans, phenergan  not on list. Please advise.

## 2024-02-27 ENCOUNTER — Other Ambulatory Visit: Payer: Self-pay | Admitting: Family Medicine

## 2024-02-27 DIAGNOSIS — R112 Nausea with vomiting, unspecified: Secondary | ICD-10-CM

## 2024-02-27 MED ORDER — ONDANSETRON 4 MG PO TBDP: 4.0000 mg | ORAL_TABLET | Freq: Three times a day (TID) | ORAL | 6 refills | Status: AC | PRN

## 2024-02-27 MED ORDER — PROMETHAZINE HCL 12.5 MG PO TABS
12.5000 mg | ORAL_TABLET | Freq: Three times a day (TID) | ORAL | 0 refills | Status: DC | PRN
Start: 2024-02-27 — End: 2024-06-18

## 2024-02-27 NOTE — Telephone Encounter (Signed)
 Duplicate, signed in separate encounter.

## 2024-03-09 ENCOUNTER — Encounter: Payer: Self-pay | Admitting: Family Medicine

## 2024-03-17 NOTE — Telephone Encounter (Signed)
 Forms completed. Faxed to [employer] with confirmation received. Sent to scan, copy to hold.

## 2024-05-05 ENCOUNTER — Telehealth: Payer: Self-pay | Admitting: Internal Medicine

## 2024-05-05 NOTE — Telephone Encounter (Signed)
 Pt states she is having trouble keeping food down. States she has been diagnosed with pyloric stenosis. Requesting tp be seen. Pt scheduled to see delon Failing PA tomorrow at 2:10pm.

## 2024-05-05 NOTE — Telephone Encounter (Signed)
 Inbound call from patient stating she has been in and out the hospital a lot recently. States she is having complications with her pyloric stenosis. States she has started to regurgitate her food again. States she is very concerned and does not want to end up back at the hospital again. Patient is requesting a call back. Please advise, thank you

## 2024-05-06 ENCOUNTER — Ambulatory Visit: Admitting: Physician Assistant

## 2024-05-06 ENCOUNTER — Encounter: Payer: Self-pay | Admitting: Physician Assistant

## 2024-05-06 ENCOUNTER — Other Ambulatory Visit: Payer: Self-pay

## 2024-05-06 VITALS — BP 128/82 | HR 84 | Ht 66.0 in | Wt 164.8 lb

## 2024-05-06 DIAGNOSIS — K31A0 Gastric intestinal metaplasia, unspecified: Secondary | ICD-10-CM

## 2024-05-06 DIAGNOSIS — Z8619 Personal history of other infectious and parasitic diseases: Secondary | ICD-10-CM | POA: Diagnosis not present

## 2024-05-06 DIAGNOSIS — M3500 Sicca syndrome, unspecified: Secondary | ICD-10-CM | POA: Diagnosis not present

## 2024-05-06 DIAGNOSIS — K311 Adult hypertrophic pyloric stenosis: Secondary | ICD-10-CM

## 2024-05-06 DIAGNOSIS — R110 Nausea: Secondary | ICD-10-CM

## 2024-05-06 DIAGNOSIS — R112 Nausea with vomiting, unspecified: Secondary | ICD-10-CM

## 2024-05-06 NOTE — Patient Instructions (Addendum)
 Continue Pantoprazole  40 mg twice daily.   Referral placed to Dr. Darin Dufault at [facility].   You have been scheduled for an endoscopy. Please follow written instructions given to you at your visit today.  If you use inhalers (even only as needed), please bring them with you on the day of your procedure.  If you take any of the following medications, they will need to be adjusted prior to your procedure:   DO NOT TAKE 7 DAYS PRIOR TO TEST- Trulicity (dulaglutide) Ozempic, Wegovy (semaglutide) Mounjaro  (tirzepatide ) Bydureon Bcise (exanatide extended release)  DO NOT TAKE 1 DAY PRIOR TO YOUR TEST Rybelsus (semaglutide) Adlyxin (lixisenatide) Victoza (liraglutide ) Byetta (exanatide) ___________________________________________________________________________

## 2024-05-06 NOTE — Progress Notes (Signed)
 Chief Complaint: Pyloric Stenosis  HPI:    [name] is a 63 year old Caucasian female with a past medical history as listed below, known to Dr. Albertus, who presents to clinic today for recurrent pyloric stenosis.    12/27/2022 gastric emptying study normal.    01/01/2023 EGD with normal mucosa in the entire esophagus, recurrent 5 cm hiatal hernia, gastritis and normal duodenum.  At that time referred to Dr. Shiela week at [facility] for consideration of redo of hiatal hernia repair given symptoms despite medical therapy.    01/01/2023 colonoscopy with moderate diverticulosis in the sigmoid colon, hepatic flexure and ascending colon, sharp angulation of the colon in association with diverticular opening in the sigmoid colon.  Trial of Trulance  given.  Repeat colon in 10 years.    02/2023-second hernia surgery at [facility].    06/2023 patient had COVID and got C. difficile.    07/2023 C. difficile and followed at [facility] with Dr. Elmo.    08/2023 patient was seen at [facility] for C. difficile and given a prolonged taper of vancomycin .    10/2023 patient seen again at [facility] for recurrent C. difficile and given vancomycin  and Dificid .    11/2023 patient saw Dr. Harlee at [facility] for fecal transplant.  This was done in March 2025 and had (VOWST).    01/2024 patient had bloody diarrhea and vomiting and found to have active colitis at [facility].    02/04/24 sigmoidoscopy with rectal ulceration likely stercoral injury and hemorrhoids.    02/09/2024 EGD at [facility] with abnormal mucosa in the fundus, narrowing at the pyloric channel and oozing of the mucosa seen with advancement of standard gastroscope into the duodenal bulb, dilated to 10 mm.    02/11/2024 CTAP with contrast showed no acute findings.  Fatty atrophy of the pancreas.    02/20/2024 EGD at [facility] with Dr. Harlee with UES stricture status post dilation to 43 French and pyloric narrowing  status post TTS dilation to 15 mm and status post biopsies post dilation with gastric erosions.  At that time recommended repeat EGD for dilation as scheduled on 6/16.  Recommended twice daily PPI.  Avoid NSAIDs.  Stay on a liquid to soft diet till repeat EGD.    04/06/2024 EGD at [facility] with normal esophagus, multiple esophageal biopsies taken, widely patent Schatzki's ring seen above a small sliding hiatal hernia, recent 54 French Maloney dilation with no mucosal effect.  Pylorus appeared narrowed and the 9.8 mm old the endoscope passed with slight resistance and scant resultant blood.  The pylorus was dilated with sequential inflation up to 15 mm.  Post dilation exam showed a shallow mucosal tear/scant blood and no apparent complication.    04/27/2024 consultation with rheumatology to discuss Sjogren syndrome.  At that time it was discussed that this could be causing some of her GI issues including pyloric stenosis.  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    Today, patient presents to clinic with extensive GI history as listed above.  Tells me she has only been following with [place] because she went to the hospital there and then was referred to the GI physicians there for fecal transplant as Dr. Albertus did not do this.  She would like to follow-up with Dr. Albertus going forward if possible.      Describes  her recent history with pyloric stenosis and 3 dilations, the last a month ago.  Tells me she was doing well but just this week has started experiencing some more nausea and is having some regurgitation of food/liquids.  Her bowel movements are also becoming further apart and smaller.  These are all signs that likely her stenosis has closed down again because this is what she had experienced over the past few months.  She is very careful with her diet at the moment and  supplements with Boost/Ensure shakes.  She is wondering what to do next.  She remains on Pantoprazole  40 mg twice a day.  Is experiencing weight loss from all of this.    Denies fever, chills or symptoms that awaken her from sleep.  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   Allergy    Anemia    Aortic atherosclerosis (HCC)    Bruising 05/10/2015   CAP (community acquired pneumonia) 08/03/2015   Chronic bronchitis (HCC)    get it q yr; in the spring time (02/17/2014)   COVID-19    Depression    have taken zoloft  since 1996 (02/17/2014)   Diverticulosis    Duodenal diverticulum    Dyspnea    from scar tissue   Epistaxis 12/01/2015   Gastritis    GERD (gastroesophageal reflux disease)    Hematemesis with nausea 08/01/2016   Hiatal hernia    High cholesterol    Hypertension    Hypothyroid    Internal hemorrhoids    Migraines    maybe a couple/yr (02/17/2014)   Multiple environmental allergies    Overweight(278.02)    Peri-menopause    PONV (postoperative nausea and vomiting)    Pre-diabetes    Schatzki's ring    Sjogren's disease (HCC)    Systemic lupus (HCC)     Past Surgical History:  Procedure Laterality Date   APPENDECTOMY  1987   BREAST BIOPSY Left 01/30/2018   BREAST EXCISIONAL BIOPSY Left 02/25/2018   ESOPHAGEAL MANOMETRY N/A 05/02/2022   Procedure: ESOPHAGEAL MANOMETRY (EM);  Surgeon: Albertus Gordy HERO, MD;  Location: [facility];  Service: Gastroenterology;  Laterality: N/A;   ESOPHAGOGASTRODUODENOSCOPY N/A 02/28/2014   Procedure: ESOPHAGOGASTRODUODENOSCOPY (EGD);  Surgeon: Gordy HERO Albertus, MD;  Location: [facility];  Service: Endoscopy;  Laterality: N/A;   ESOPHAGOGASTRODUODENOSCOPY N/A 08/02/2016   Procedure: ESOPHAGOGASTRODUODENOSCOPY (EGD);  Surgeon: Lupita FORBES Commander, MD;  Location: [facility] ENDOSCOPY;  Service: Endoscopy;  Laterality: N/A;   EXPLORATORY LAPAROTOMY  1987   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   FOOT SURGERY  Right ~1980   don't know what they did; had to take something out   HERNIA REPAIR     INGUINAL HERNIA REPAIR Right ~ 2003   RADIOACTIVE SEED GUIDED EXCISIONAL BREAST BIOPSY Left 02/25/2018   Procedure: RADIOACTIVE SEED GUIDED EXCISIONAL BREAST BIOPSY;  Surgeon: Ebbie Cough, MD;  Location: [facility];  Service: General;  Laterality: Left;   SHOULDER OPEN ROTATOR CUFF REPAIR Left 1995   SHOULDER SURGERY Left 1997   cartilage   TONSILLECTOMY AND ADENOIDECTOMY  1976   TUBAL LIGATION  1998   laparoscopic   VAGINAL HYSTERECTOMY  04/08/2012   menorrhagia   VAGINAL PROLAPSE REPAIR  08/04/2018   Anterior and posterior with colp-mesh-Dr. Comer Ku @ [facility]   XI ROBOTIC ASSISTED HIATAL HERNIA REPAIR N/A 05/21/2022   Procedure: XI ROBOTIC ASSISTED HIATAL HERNIA REPAIR WITH TOUPET FUNDOPLICATION;  Surgeon: Signe Mitzie LABOR, MD;  Location: [facility];  Service: General;  Laterality: N/A;    Current Outpatient Medications  Medication Sig Dispense Refill   acetaminophen  (TYLENOL ) 500 MG tablet Take 1,000 mg by mouth as needed.     carboxymethylcellulose (REFRESH PLUS) 0.5 % SOLN Place 1 drop into both eyes 3 (three) times daily as needed (dry/irritated eyes).     cetirizine (ZYRTEC) 10 MG tablet Take 10 mg by mouth daily.     diphenhydrAMINE  (BENADRYL ) 25 MG tablet Take 25 mg by mouth every 6 (six) hours as needed for allergies.     hydrochlorothiazide  (HYDRODIURIL ) 25 MG tablet Take 1 tablet (25 mg total) by mouth daily. 90 tablet 1   levothyroxine  (SYNTHROID ) 150 MCG tablet Take 1 tablet (150 mcg total) by mouth daily. 90 tablet 1   ondansetron  (ZOFRAN -ODT) 4 MG disintegrating tablet Take 1 tablet (4 mg total) by mouth every 8 (eight) hours as needed for nausea or vomiting. 30 tablet 6   pantoprazole  (PROTONIX ) 40 MG tablet Take 1 tablet (40 mg total) by mouth daily. 90 tablet 3   PREVIDENT 5000 SENSITIVE 1.1-5 % GEL Take 1 Application by mouth in the morning and at  bedtime.     promethazine  (PHENERGAN ) 12.5 MG tablet Take 1 tablet (12.5 mg total) by mouth every 8 (eight) hours as needed for nausea or vomiting. 30 tablet 0   sertraline  (ZOLOFT ) 100 MG tablet TAKE 1/2 TABLET(50 MG) BY MOUTH EVERY DAY 45 tablet 3   SUMAtriptan  (IMITREX ) 100 MG tablet May repeat in 2 hours if headache persists(Plz sched with new provider) 12 tablet 2   No current facility-administered medications for this visit.    Allergies as of 05/06/2024 - Review Complete 05/06/2024  Allergen Reaction Noted   Contrast media [iodinated contrast media] Hives and Itching 03/06/2016   Other Hives and Swelling 03/06/2016   Codeine Nausea Only    Erythromycin Nausea And Vomiting    Iohexol  Hives 02/12/2014    Family History  Problem Relation Age of Onset   Hypertension Father    Vision loss Father    Alcohol abuse Father    Emphysema Father        Smoker   Hearing loss Father    Early death Father        Murdered   Hypertension Mother    Heart disease Mother    Hepatitis Mother        Hep C   Endometriosis Mother    Pancreatic disease Mother    Depression Mother    Hearing loss Mother    Lupus Sister    Endometriosis Sister    Arthritis Sister    Depression Sister    Breast cancer Paternal Aunt        58's   Lung cancer Paternal Uncle        smoker   Colon cancer Maternal Grandfather    Uterine cancer Maternal Aunt        50's   Emphysema Paternal Aunt        Non-smoker/Died of Pneumonia   Lung cancer Paternal Uncle    Heart attack Maternal Uncle    Parkinson's disease Maternal Grandmother    Alcohol abuse Maternal Grandmother    Heart disease Paternal Grandmother        Died during angioplasty surgery   Aneurysm Paternal Grandfather    Allergy (severe) Son        ASA-Idiopathic Thrombocytopenia   Early death Maternal Uncle        Murdered   Lung  disease Paternal Aunt        Bronchiecstasis   Lung cancer Paternal Uncle    Early death Paternal Uncle         Carbon Monoxide Poisoning/Accidental   Stomach cancer Neg Hx    Pancreatic cancer Neg Hx     Social History   Socioeconomic History   Marital status: Single    Spouse name: Not on file   Number of children: 2   Years of education: Not on file   Highest education level: Associate degree: occupational, Scientist, product/process development, or vocational program  Occupational History   Not on file  Tobacco Use   Smoking status: Never   Smokeless tobacco: Never  Vaping Use   Vaping status: Never Used  Substance and Sexual Activity   Alcohol use: Not Currently    Alcohol/week: 0.0 standard drinks of alcohol    Comment: socially   Drug use: No   Sexual activity: Not on file  Other Topics Concern   Not on file  Social History Narrative   Works at [employer]   Social Drivers of Health   Financial Resource Strain: Medium Risk (12/19/2023)   Overall Financial Resource Strain (CARDIA)    Difficulty of Paying Living Expenses: Somewhat hard  Food Insecurity: Low Risk  (02/20/2024)   Received from [facility]   Hunger Vital Sign    Within the past 12 months, you worried that your food would run out before you got money to buy more: Never true    Within the past 12 months, the food you bought just didn't last and you didn't have money to get more. : Never true  Recent Concern: Food Insecurity - Food Insecurity Present (12/19/2023)   Hunger Vital Sign    Worried About Running Out of Food in the Last Year: Sometimes true    Ran Out of Food in the Last Year: Never true  Transportation Needs: Unmet Transportation Needs (02/20/2024)   Received from [facility]    In the past 12 months, has lack of reliable transportation kept you from medical appointments, meetings, work or from getting things needed for daily living? : Yes  Physical Activity: Unknown (06/20/2023)   Exercise Vital Sign    Days of Exercise per Week: 0 days    Minutes of Exercise per Session: Not on file  Stress: No Stress Concern  Present (06/20/2023)   [employer] of Occupational Health - Occupational Stress Questionnaire    Feeling of Stress : Only a little  Social Connections: Socially Isolated (12/19/2023)   Social Connection and Isolation Panel    Frequency of Communication with Friends and Family: More than three times a week    Frequency of Social Gatherings with Friends and Family: Twice a week    Attends Religious Services: Never    Database administrator or Organizations: No    Attends Engineer, structural: Not on file    Marital Status: Divorced  Intimate Partner Violence: Unknown (01/26/2022)   Received from [facility]   HITS    Physically Hurt: Not on file    Insult or Talk Down To: Not on file    Threaten Physical Harm: Not on file    Scream or Curse: Not on file    Review of Systems:    Constitutional: No fever or chills Cardiovascular: No chest pain  Respiratory: No SOB  Gastrointestinal: See HPI and otherwise negative Genitourinary: No dysuria  Neurological: No headache, dizziness or syncope  Musculoskeletal: No new muscle or joint pain Hematologic: No bleeding  Psychiatric: No history of depression or anxiety   Physical Exam:  Vital signs: BP 128/82   Pulse 84   Ht 5' 6 (1.676 m)   Wt 164 lb 12.8 oz (74.8 kg)   LMP 04/03/2012 Comment: Still has Cervix and Ovaries  SpO2 94%   BMI 26.60 kg/m    Constitutional:  Very Pleasant Caucasian female appears to be in NAD, Well developed, Well nourished, alert and cooperative Head:  Normocephalic and atraumatic. Eyes:   PEERL, EOMI. No icterus. Conjunctiva pink. Ears:  Normal auditory acuity. Neck:  Supple Throat: Oral cavity and pharynx without inflammation, swelling or lesion.  Respiratory: Respirations even and unlabored. Lungs clear to auscultation bilaterally.   No wheezes, crackles, or rhonchi.  Cardiovascular: Normal S1, S2. No MRG. Regular rate and rhythm. No peripheral edema, cyanosis or pallor.  Gastrointestinal:   Soft, nondistended, mild epigastric ttp. No rebound or guarding. Normal bowel sounds. No appreciable masses or hepatomegaly. Rectal:  Not performed.  Msk:  Symmetrical without gross deformities. Without edema, no deformity or joint abnormality.  Neurologic:  Alert and  oriented x4;  grossly normal neurologically.  Skin:   Dry and intact without significant lesions or rashes. Psychiatric: Demonstrates good judgement and reason without abnormal affect or behaviors.  Most recent LABS AND IMAGING/also see HPI for GI procedures. CBC    Component Value Date/Time   WBC 7.3 11/14/2023 1235   RBC 4.84 11/14/2023 1235   HGB 14.1 11/14/2023 1235   HGB 13.5 02/01/2022 1151   HCT 41.6 11/14/2023 1235   HCT 40.6 02/01/2022 1151   PLT 359.0 11/14/2023 1235   PLT 359 02/01/2022 1151   MCV 85.9 11/14/2023 1235   MCV 88 02/01/2022 1151   MCH 29.5 05/22/2022 0337   MCHC 33.9 11/14/2023 1235   RDW 14.8 11/14/2023 1235   RDW 12.7 02/01/2022 1151   LYMPHSABS 2.1 11/14/2023 1235   LYMPHSABS 2.1 02/01/2022 1151   MONOABS 0.6 11/14/2023 1235   EOSABS 0.3 11/14/2023 1235   EOSABS 0.4 02/01/2022 1151   BASOSABS 0.0 11/14/2023 1235   BASOSABS 0.1 02/01/2022 1151    CMP     Component Value Date/Time   NA 140 11/07/2023 1503   NA 144 02/01/2022 1151   K 3.9 11/07/2023 1503   CL 98 11/07/2023 1503   CO2 27 11/07/2023 1503   GLUCOSE 99 11/07/2023 1503   BUN 16 11/07/2023 1503   BUN 8 02/01/2022 1151   CREATININE 1.04 11/07/2023 1503   CREATININE 0.80 05/30/2015 1204   CALCIUM  10.4 11/07/2023 1503   PROT 8.1 09/10/2023 0920   PROT 7.1 02/01/2022 1151   ALBUMIN  4.8 09/10/2023 0920   ALBUMIN  4.4 02/01/2022 1151   AST 16 09/10/2023 0920   ALT 12 09/10/2023 0920   ALKPHOS 51 09/10/2023 0920   BILITOT 0.6 09/10/2023 0920   BILITOT <0.2 02/01/2022 1151   GFRNONAA >60 05/22/2022 0337   GFRAA 93 02/26/2020 0825    Assessment: 1.  Recurrent pyloric stenosis: Patient is now status post 3 dilations  of pyloric stenosis, the last a month ago with dilation up to 15 mm, this is thought at least partially due to her Sjogren's syndrome, is followed with rheumatology as well who do not have any good recommendations, now with increasing nausea and regurgitation and feels like symptoms are coming back 2.  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  Sjogren's: Thought to at least be partially contributing to her GI symptoms  Plan: 1.  Had a discussion with Dr. Albertus at time of patient's visit.  He recommends repeat EGD with dilation of pyloric stenosis.  He offered August 26 as his next available date.  The patient tells me that is too long from now and she would end up in the ER.  Went ahead and scheduled her with Dr. Charlanne for EGD with dilation of pyloric stenosis in the next couple of weeks in the [facility].  Did provide the patient with a detailed list of risks for procedure and she agrees to proceed. 2.  Also went ahead and referred the patient to [facility], Dr. Darin Dafault for consideration of G-POEM given recurrent stenosis. 3.  Continue Pantoprazole  40 twice daily 4.  Recommend the patient stay on a liquid diet as she is already experiencing some symptoms from the stenosis.   5.  Patient to follow in clinic per recommendations after time of EGD.  Delon Failing, PA-C [facility] 05/06/2024, 2:00 PM  Cc: Almarie Waddell NOVAK, NP

## 2024-05-10 ENCOUNTER — Other Ambulatory Visit: Payer: Self-pay | Admitting: Family Medicine

## 2024-05-13 ENCOUNTER — Encounter: Payer: Self-pay | Admitting: Gastroenterology

## 2024-05-13 ENCOUNTER — Ambulatory Visit: Admitting: Gastroenterology

## 2024-05-13 VITALS — BP 144/76 | HR 65 | Temp 97.9°F | Resp 13 | Ht 66.0 in | Wt 164.0 lb

## 2024-05-13 DIAGNOSIS — R198 Other specified symptoms and signs involving the digestive system and abdomen: Secondary | ICD-10-CM

## 2024-05-13 DIAGNOSIS — K311 Adult hypertrophic pyloric stenosis: Secondary | ICD-10-CM

## 2024-05-13 DIAGNOSIS — K449 Diaphragmatic hernia without obstruction or gangrene: Secondary | ICD-10-CM | POA: Diagnosis not present

## 2024-05-13 DIAGNOSIS — R1013 Epigastric pain: Secondary | ICD-10-CM

## 2024-05-13 DIAGNOSIS — R112 Nausea with vomiting, unspecified: Secondary | ICD-10-CM

## 2024-05-13 DIAGNOSIS — K2970 Gastritis, unspecified, without bleeding: Secondary | ICD-10-CM

## 2024-05-13 DIAGNOSIS — K219 Gastro-esophageal reflux disease without esophagitis: Secondary | ICD-10-CM

## 2024-05-13 DIAGNOSIS — K3189 Other diseases of stomach and duodenum: Secondary | ICD-10-CM

## 2024-05-13 DIAGNOSIS — R1084 Generalized abdominal pain: Secondary | ICD-10-CM

## 2024-05-13 MED ORDER — SODIUM CHLORIDE 0.9 % IV SOLN: 500.0000 mL | INTRAVENOUS | Status: DC

## 2024-05-13 MED ORDER — PANTOPRAZOLE SODIUM 40 MG PO TBEC: 40.0000 mg | DELAYED_RELEASE_TABLET | Freq: Two times a day (BID) | ORAL | 2 refills | Status: DC

## 2024-05-13 NOTE — Progress Notes (Signed)
 Report to PACU, RN, vss, BBS= Clear.

## 2024-05-13 NOTE — Patient Instructions (Addendum)
 See handout for dilatation diet Continue present medications Continue present medications including Protonix  40 mg twice daily  YOU HAD AN ENDOSCOPIC PROCEDURE TODAY AT THE [facility]:   Refer to the procedure report that was given to you for any specific questions about what was found during the examination.  If the procedure report does not answer your questions, please call your gastroenterologist to clarify.  If you requested that your care partner not be given the details of your procedure findings, then the procedure report has been included in a sealed envelope for you to review at your convenience later.  YOU SHOULD EXPECT: Some feelings of bloating in the abdomen. Passage of more gas than usual.  Walking can help get rid of the air that was put into your GI tract during the procedure and reduce the bloating. If you had a lower endoscopy (such as a colonoscopy or flexible sigmoidoscopy) you may notice spotting of blood in your stool or on the toilet paper. If you underwent a bowel prep for your procedure, you may not have a normal bowel movement for a few days.  Please Note:  You might notice some irritation and congestion in your nose or some drainage.  This is from the oxygen used during your procedure.  There is no need for concern and it should clear up in a day or so.  SYMPTOMS TO REPORT IMMEDIATELY: Following upper endoscopy (EGD)  Vomiting of blood or coffee ground material  New chest pain or pain under the shoulder blades  Painful or persistently difficult swallowing  New shortness of breath  Fever of 100F or higher  Black, tarry-looking stools  For urgent or emergent issues, a gastroenterologist can be reached at any hour by calling [phone]. Do not use MyChart messaging for urgent concerns.   DIET:  We do recommend a small meal at first, but then you may proceed to your regular diet.  Drink plenty of fluids but you should avoid alcoholic beverages for 24  hours.  ACTIVITY:  You should plan to take it easy for the rest of today and you should NOT DRIVE or use heavy machinery until tomorrow (because of the sedation medicines used during the test).    FOLLOW UP: Our staff will call the number listed on your records the next business day following your procedure.  We will call around 7:15- 8:00 am to check on you and address any questions or concerns that you may have regarding the information given to you following your procedure. If we do not reach you, we will leave a message.     If any biopsies were taken you will be contacted by phone or by letter within the next 1-3 weeks.  Please call us  at [phone] if you have not heard about the biopsies in 3 weeks.   SIGNATURES/CONFIDENTIALITY: You and/or your care partner have signed paperwork which will be entered into your electronic medical record.  These signatures attest to the fact that that the information above on your After Visit Summary has been reviewed and is understood.  Full responsibility of the confidentiality of this discharge information lies with you and/or your care-partner.

## 2024-05-13 NOTE — Progress Notes (Signed)
 Chief Complaint: Pyloric Stenosis   HPI:    [name] is a 63 year old Caucasian female with a past medical history as listed below, known to Dr. Albertus, who presents to clinic today for recurrent pyloric stenosis.    12/27/2022 gastric emptying study normal.    01/01/2023 EGD with normal mucosa in the entire esophagus, recurrent 5 cm hiatal hernia, gastritis and normal duodenum.  At that time referred to Dr. Shiela Wise at [facility] for consideration of redo of hiatal hernia repair given symptoms despite medical therapy.    01/01/2023 colonoscopy with moderate diverticulosis in the sigmoid colon, hepatic flexure and ascending colon, sharp angulation of the colon in association with diverticular opening in the sigmoid colon.  Trial of Trulance  given.  Repeat colon in 10 years.    02/2023-second hernia surgery at [facility].    06/2023 patient had COVID and got C. difficile.    07/2023 C. difficile and followed at [facility] with Dr. Elmo.    08/2023 patient was seen at [facility] for C. difficile and given a prolonged taper of vancomycin .    10/2023 patient seen again at [facility] for recurrent C. difficile and given vancomycin  and Dificid .    11/2023 patient saw Dr. Harlee at [facility] for fecal transplant.  This was done in March 2025 and had (VOWST).    01/2024 patient had bloody diarrhea and vomiting and found to have active colitis at [facility].    02/04/24 sigmoidoscopy with rectal ulceration likely stercoral injury and hemorrhoids.    02/09/2024 EGD at [facility] with abnormal mucosa in the fundus, narrowing at the pyloric channel and oozing of the mucosa seen with advancement of standard gastroscope into the duodenal bulb, dilated to 10 mm.    02/11/2024 CTAP with contrast showed no acute findings.  Fatty atrophy of the pancreas.    02/20/2024 EGD at [facility] with Dr. Harlee with UES stricture status post dilation to 52 French and pyloric narrowing  status post TTS dilation to 15 mm and status post biopsies post dilation with gastric erosions.  At that time recommended repeat EGD for dilation as scheduled on 6/16.  Recommended twice daily PPI.  Avoid NSAIDs.  Stay on a liquid to soft diet till repeat EGD.    04/06/2024 EGD at [facility] with normal esophagus, multiple esophageal biopsies taken, widely patent Schatzki's ring seen above a small sliding hiatal hernia, recent 54 French Maloney dilation with no mucosal effect.  Pylorus appeared narrowed and the 9.8 mm old the endoscope passed with slight resistance and scant resultant blood.  The pylorus was dilated with sequential inflation up to 15 mm.  Post dilation exam showed a shallow mucosal tear/scant blood and no apparent complication.    04/27/2024 consultation with rheumatology to discuss Sjogren syndrome.  At that time it was discussed that this could be causing some of her GI issues including pyloric stenosis.  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    Today, patient presents to clinic with extensive GI history as listed above.  Tells me she has only been following with [place] because she went to the hospital there and then was referred to the GI physicians there for fecal transplant as Dr. Albertus did not do this.  She would like to follow-up with Dr. Albertus going forward if possible.  Describes her recent history with pyloric stenosis and 3 dilations, the last a month ago.  Tells me she was doing well but just this Wise has started experiencing some more nausea and is having some regurgitation of food/liquids.  Her bowel movements are also becoming further apart and smaller.  These are all signs that likely her stenosis has closed down again because this is what she had experienced over the past few months.  She is very careful with her diet at the moment and  supplements with Boost/Ensure shakes.  She is wondering what to do next.  She remains on Pantoprazole  40 mg twice a day.  Is experiencing weight loss from all of this.    Denies fever, chills or symptoms that awaken her from sleep.       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   Allergy     Anemia     Aortic atherosclerosis (HCC)     Bruising 05/10/2015   CAP (community acquired pneumonia) 08/03/2015   Chronic bronchitis (HCC)      get it q yr; in the spring time (02/17/2014)   COVID-19     Depression      have taken zoloft  since 1996 (02/17/2014)   Diverticulosis     Duodenal diverticulum     Dyspnea      from scar tissue   Epistaxis 12/01/2015   Gastritis     GERD (gastroesophageal reflux disease)     Hematemesis with nausea 08/01/2016   Hiatal hernia     High cholesterol     Hypertension     Hypothyroid     Internal hemorrhoids     Migraines      maybe a couple/yr (02/17/2014)   Multiple environmental allergies     Overweight(278.02)     Peri-menopause     PONV (postoperative nausea and vomiting)     Pre-diabetes     Schatzki's ring     Sjogren's disease (HCC)     Systemic lupus (HCC)                 Past Surgical History:  Procedure Laterality Date   APPENDECTOMY   1987   BREAST BIOPSY Left 01/30/2018   BREAST EXCISIONAL BIOPSY Left 02/25/2018   ESOPHAGEAL MANOMETRY N/A 05/02/2022    Procedure: ESOPHAGEAL MANOMETRY (EM);  Surgeon: Gina Gina HERO, MD;  Location: [facility];  Service: Gastroenterology;  Laterality: N/A;   ESOPHAGOGASTRODUODENOSCOPY N/A 02/28/2014    Procedure: ESOPHAGOGASTRODUODENOSCOPY (EGD);  Surgeon: Gina Wise Albertus, MD;  Location: [facility] ENDOSCOPY;  Service: Endoscopy;  Laterality: N/A;   ESOPHAGOGASTRODUODENOSCOPY N/A 08/02/2016    Procedure: ESOPHAGOGASTRODUODENOSCOPY (EGD);  Surgeon: Gina FORBES Commander, MD;  Location: [facility] ENDOSCOPY;  Service: Endoscopy;  Laterality: N/A;   EXPLORATORY LAPAROTOMY   1987   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   FOOT SURGERY Right ~1980    don't know what they did; had to take something out   HERNIA REPAIR       INGUINAL HERNIA REPAIR Right ~ 2003   RADIOACTIVE SEED GUIDED EXCISIONAL BREAST BIOPSY Left 02/25/2018    Procedure: RADIOACTIVE SEED GUIDED EXCISIONAL BREAST BIOPSY;  Surgeon: Ebbie Cough, MD;  Location: [facility];  Service: General;  Laterality: Left;   SHOULDER OPEN ROTATOR CUFF REPAIR Left 1995   SHOULDER SURGERY Left 1997    cartilage   TONSILLECTOMY AND ADENOIDECTOMY   1976   TUBAL LIGATION   1998  laparoscopic   VAGINAL HYSTERECTOMY   04/08/2012    menorrhagia   VAGINAL PROLAPSE REPAIR   08/04/2018    Anterior and posterior with colp-mesh-Dr. Comer Ku @ [facility]   XI ROBOTIC ASSISTED HIATAL HERNIA REPAIR N/A 05/21/2022    Procedure: XI ROBOTIC ASSISTED HIATAL HERNIA REPAIR WITH TOUPET FUNDOPLICATION;  Surgeon: Signe Mitzie LABOR, MD;  Location: [facility];  Service: General;  Laterality: N/A;                Current Outpatient Medications  Medication Sig Dispense Refill   acetaminophen  (TYLENOL ) 500 MG tablet Take 1,000 mg by mouth as needed.       carboxymethylcellulose (REFRESH PLUS) 0.5 % SOLN Place 1 drop into both eyes 3 (three) times daily as needed (dry/irritated eyes).       cetirizine (ZYRTEC) 10 MG tablet Take 10 mg by mouth daily.       diphenhydrAMINE  (BENADRYL ) 25 MG tablet Take 25 mg by mouth every 6 (six) hours as needed for allergies.       hydrochlorothiazide  (HYDRODIURIL ) 25 MG tablet Take 1 tablet (25 mg total) by mouth daily. 90 tablet 1   levothyroxine  (SYNTHROID ) 150 MCG tablet Take 1 tablet (150 mcg total) by mouth daily. 90 tablet 1   ondansetron  (ZOFRAN -ODT) 4 MG disintegrating tablet Take 1 tablet (4 mg total) by mouth every 8 (eight) hours as needed for nausea or vomiting. 30 tablet 6   pantoprazole  (PROTONIX ) 40 MG tablet Take 1 tablet (40 mg total) by mouth daily. 90 tablet 3    PREVIDENT 5000 SENSITIVE 1.1-5 % GEL Take 1 Application by mouth in the morning and at bedtime.       promethazine  (PHENERGAN ) 12.5 MG tablet Take 1 tablet (12.5 mg total) by mouth every 8 (eight) hours as needed for nausea or vomiting. 30 tablet 0   sertraline  (ZOLOFT ) 100 MG tablet TAKE 1/2 TABLET(50 MG) BY MOUTH EVERY DAY 45 tablet 3   SUMAtriptan  (IMITREX ) 100 MG tablet May repeat in 2 hours if headache persists(Plz sched with new provider) 12 tablet 2      No current facility-administered medications for this visit.             Allergies as of 05/06/2024 - Review Complete 05/06/2024  Allergen Reaction Noted   Contrast media [iodinated contrast media] Hives and Itching 03/06/2016   Other Hives and Swelling 03/06/2016   Codeine Nausea Only     Erythromycin Nausea And Vomiting     Iohexol  Hives 02/12/2014           Family History  Problem Relation Age of Onset   Hypertension Father     Vision loss Father     Alcohol abuse Father     Emphysema Father          Smoker   Hearing loss Father     Early death Father          Murdered   Hypertension Mother     Heart disease Mother     Hepatitis Mother          Hep C   Endometriosis Mother     Pancreatic disease Mother     Depression Mother     Hearing loss Mother     Lupus Sister     Endometriosis Sister     Arthritis Sister     Depression Sister     Breast cancer Paternal Aunt          41's  Lung cancer Paternal Uncle          smoker   Colon cancer Maternal Grandfather     Uterine cancer Maternal Aunt          50's   Emphysema Paternal Aunt          Non-smoker/Died of Pneumonia   Lung cancer Paternal Uncle     Heart attack Maternal Uncle     Parkinson's disease Maternal Grandmother     Alcohol abuse Maternal Grandmother     Heart disease Paternal Grandmother          Died during angioplasty surgery   Aneurysm Paternal Grandfather     Allergy (severe) Son          ASA-Idiopathic Thrombocytopenia   Early  death Maternal Uncle          Murdered   Lung disease Paternal Aunt          Bronchiecstasis   Lung cancer Paternal Uncle     Early death Paternal Uncle          Carbon Monoxide Poisoning/Accidental   Stomach cancer Neg Hx     Pancreatic cancer Neg Hx            Social History         Socioeconomic History   Marital status: Single      Spouse name: Not on file   Number of children: 2   Years of education: Not on file   Highest education level: Associate degree: occupational, Scientist, product/process development, or vocational program  Occupational History   Not on file  Tobacco Use   Smoking status: Never   Smokeless tobacco: Never  Vaping Use   Vaping status: Never Used  Substance and Sexual Activity   Alcohol use: Not Currently      Alcohol/Wise: 0.0 standard drinks of alcohol      Comment: socially   Drug use: No   Sexual activity: Not on file  Other Topics Concern   Not on file  Social History Narrative    Works at [employer]    Social Drivers of Health        Financial Resource Strain: Medium Risk (12/19/2023)    Overall Financial Resource Strain (CARDIA)     Difficulty of Paying Living Expenses: Somewhat hard  Food Insecurity: Low Risk  (02/20/2024)    Received from [facility]    Hunger Vital Sign     Within the past 12 months, you worried that your food would run out before you got money to buy more: Never true     Within the past 12 months, the food you bought just didn't last and you didn't have money to get more. : Never true  Recent Concern: Food Insecurity - Food Insecurity Present (12/19/2023)    Hunger Vital Sign     Worried About Running Out of Food in the Last Year: Sometimes true     Ran Out of Food in the Last Year: Never true  Transportation Needs: Unmet Transportation Needs (02/20/2024)    Received from [employer]     In the past 12 months, has lack of reliable transportation kept you from medical appointments, meetings, work or from getting things  needed for daily living? : Yes  Physical Activity: Unknown (06/20/2023)    Exercise Vital Sign     Days of Exercise per Wise: 0 days     Minutes of Exercise per Session: Not on file  Stress: No Stress Concern Present (06/20/2023)    [employer] of Occupational Health - Occupational Stress Questionnaire     Feeling of Stress : Only a little  Social Connections: Socially Isolated (12/19/2023)    Social Connection and Isolation Panel     Frequency of Communication with Friends and Family: More than three times a Wise     Frequency of Social Gatherings with Friends and Family: Twice a Wise     Attends Religious Services: Never     Database administrator or Organizations: No     Attends Engineer, structural: Not on file     Marital Status: Divorced  Intimate Partner Violence: Unknown (01/26/2022)    Received from [facility]    HITS     Physically Hurt: Not on file     Insult or Talk Down To: Not on file     Threaten Physical Harm: Not on file     Scream or Curse: Not on file      Review of Systems:    Constitutional: No fever or chills Cardiovascular: No chest pain  Respiratory: No SOB  Gastrointestinal: See HPI and otherwise negative Genitourinary: No dysuria  Neurological: No headache, dizziness or syncope Musculoskeletal: No new muscle or joint pain Hematologic: No bleeding  Psychiatric: No history of depression or anxiety    Physical Exam:  Vital signs: BP 128/82   Pulse 84   Ht 5' 6 (1.676 m)   Wt 164 lb 12.8 oz (74.8 kg)   LMP 04/03/2012 Comment: Still has Cervix and Ovaries  SpO2 94%   BMI 26.60 kg/m     Constitutional:  Very Pleasant Caucasian female appears to be in NAD, Well developed, Well nourished, alert and cooperative Head:  Normocephalic and atraumatic. Eyes:   PEERL, EOMI. No icterus. Conjunctiva pink. Ears:  Normal auditory acuity. Neck:  Supple Throat: Oral cavity and pharynx without inflammation, swelling or lesion.  Respiratory:  Respirations even and unlabored. Lungs clear to auscultation bilaterally.   No wheezes, crackles, or rhonchi.  Cardiovascular: Normal S1, S2. No MRG. Regular rate and rhythm. No peripheral edema, cyanosis or pallor.  Gastrointestinal:  Soft, nondistended, mild epigastric ttp. No rebound or guarding. Normal bowel sounds. No appreciable masses or hepatomegaly. Rectal:  Not performed.  Msk:  Symmetrical without gross deformities. Without edema, no deformity or joint abnormality.  Neurologic:  Alert and  oriented x4;  grossly normal neurologically.  Skin:   Dry and intact without significant lesions or rashes. Psychiatric: Demonstrates good judgement and reason without abnormal affect or behaviors.   Most recent LABS AND IMAGING/also see HPI for GI procedures. CBC Labs (Brief)          Component Value Date/Time    WBC 7.3 11/14/2023 1235    RBC 4.84 11/14/2023 1235    HGB 14.1 11/14/2023 1235    HGB 13.5 02/01/2022 1151    HCT 41.6 11/14/2023 1235    HCT 40.6 02/01/2022 1151    PLT 359.0 11/14/2023 1235    PLT 359 02/01/2022 1151    MCV 85.9 11/14/2023 1235    MCV 88 02/01/2022 1151    MCH 29.5 05/22/2022 0337    MCHC 33.9 11/14/2023 1235    RDW 14.8 11/14/2023 1235    RDW 12.7 02/01/2022 1151    LYMPHSABS 2.1 11/14/2023 1235    LYMPHSABS 2.1 02/01/2022 1151    MONOABS 0.6 11/14/2023 1235    EOSABS 0.3 11/14/2023 1235    EOSABS 0.4 02/01/2022  1151    BASOSABS 0.0 11/14/2023 1235    BASOSABS 0.1 02/01/2022 1151        CMP     Labs (Brief)          Component Value Date/Time    NA 140 11/07/2023 1503    NA 144 02/01/2022 1151    K 3.9 11/07/2023 1503    CL 98 11/07/2023 1503    CO2 27 11/07/2023 1503    GLUCOSE 99 11/07/2023 1503    BUN 16 11/07/2023 1503    BUN 8 02/01/2022 1151    CREATININE 1.04 11/07/2023 1503    CREATININE 0.80 05/30/2015 1204    CALCIUM  10.4 11/07/2023 1503    PROT 8.1 09/10/2023 0920    PROT 7.1 02/01/2022 1151    ALBUMIN  4.8 09/10/2023 0920     ALBUMIN  4.4 02/01/2022 1151    AST 16 09/10/2023 0920    ALT 12 09/10/2023 0920    ALKPHOS 51 09/10/2023 0920    BILITOT 0.6 09/10/2023 0920    BILITOT <0.2 02/01/2022 1151    GFRNONAA >60 05/22/2022 0337    GFRAA 93 02/26/2020 0825        Assessment: 1.  Recurrent pyloric stenosis: Patient is now status post 3 dilations of pyloric stenosis, the last a month ago with dilation up to 15 mm, this is thought at least partially due to her Sjogren's syndrome, is followed with rheumatology as well who do not have any good recommendations, now with increasing nausea and regurgitation and feels like symptoms are coming back 2.  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  Sjogren's: Thought to at least be partially contributing to her GI symptoms   Plan: 1.  Had a discussion with Dr. Albertus at time of patient's visit.  He recommends repeat EGD with dilation of pyloric stenosis.  He offered August 26 as his next available date.  The patient tells me that is too long from now and she would end up in the ER.  Went ahead and scheduled her with Dr. Charlanne for EGD with dilation of pyloric stenosis in the next couple of weeks in the [facility].  Did provide the patient with a detailed list of risks for procedure and she agrees to proceed. 2.  Also went ahead and referred the patient to [facility], Dr. Darin Dafault for consideration of G-POEM given recurrent stenosis. 3.  Continue Pantoprazole  40 twice daily 4.  Recommend the patient stay on a liquid diet as she is already experiencing some symptoms from the stenosis.   5.  Patient to follow in clinic per recommendations after time of EGD.   Delon Failing, PA-C [facility]     Attending physician's note   I have taken history, reviewed the chart and examined the patient. I performed a substantive portion of this encounter, including complete performance of at least one  of the key components, in conjunction with the APP. I agree with the Advanced Practitioner's note, impression and recommendations.   Last EGD 6/16 Findings Normal esophagus, without evident inflammation, stricture, or Barrett's epithelium.  Multiple esophageal biopsies were taken from the proximal and distal esophagus to evaluate for Eosinophilic esophagitis. A widely patent Schatzki ring seen above a small sliding hiatal hernia.   Recent 54 [place] Maloney dilation had no mucosal effect. The stomach appeared normal. Multiple gastric biopsies were taken according to the [place] protocol to evaluate for intestinal metaplasia and H.pylori.  The pylorus appeared narrowed and the 9.8 mm O.D. endoscope passed with slight resistance and scant resultant blood.  The pylorus was dilated with sequential inflation of the 12-13.5-15 mm TTS balloon, held for 30 seconds at each diameter.  Post-dilation examination showed a shallow mucosal tear, scant blood, and no apparent complication.    Anselm Bring, MD [facility] [phone]

## 2024-05-13 NOTE — Progress Notes (Signed)
 Pt's states no medical or surgical changes since previsit or office visit.

## 2024-05-13 NOTE — Progress Notes (Signed)
 Established Patient Office Visit  Subjective   Patient ID: Gina Wise, female    DOB: [birth year]  Age: 63 y.o. MRN: [id number]  Chief Complaint  Patient presents with   EGD   Dysphagia    HPI    ROS    Objective:     BP 134/75   Pulse 69   Temp 97.9 F (36.6 C) (Temporal)   Resp 13   Ht 5' 6 (1.676 m)   Wt 164 lb (74.4 kg)   LMP 04/03/2012 Comment: Still has Cervix and Ovaries  SpO2 98%   BMI 26.47 kg/m    Physical Exam   No results found for any visits on 05/13/24.    The 10-year ASCVD risk score (Arnett DK, et al., 2019) is: 5.9%    Assessment & Plan:   Problem List Items Addressed This Visit       Digestive   GERD (gastroesophageal reflux disease)   Relevant Orders   Diet NPO   Hypoglycemia/Hyperglycemia protocol   Emergency Medications   ECG and pulse monitoring    Monitor NIBP   Notify physician   Monitor O2 SATs   NPO status   Vital signs   Monitor NIBP, ECG and PSA02   Notify physician   Discharge home with responsible adult.   Discharge instructions   Esophagogastroduodenoscopy: verify informed consent   Esophageal dilation: verify informed consent   Other Visit Diagnoses       Pyloric stenosis    -  Primary   Relevant Orders   Diet NPO   Hypoglycemia/Hyperglycemia protocol   Emergency Medications   ECG and pulse monitoring    Monitor NIBP   Notify physician   Monitor O2 SATs   NPO status   Vital signs   Monitor NIBP, ECG and PSA02   Notify physician   Discharge home with responsible adult.   Discharge instructions   Esophagogastroduodenoscopy: verify informed consent   Esophageal dilation: verify informed consent     Nausea and vomiting, unspecified vomiting type       Relevant Orders   Diet NPO   Hypoglycemia/Hyperglycemia protocol   Emergency Medications   ECG and pulse monitoring    Monitor NIBP   Notify physician   Monitor O2 SATs   NPO status   Vital signs   Monitor NIBP, ECG and PSA02   Notify  physician   Discharge home with responsible adult.   Discharge instructions   Esophagogastroduodenoscopy: verify informed consent   Esophageal dilation: verify informed consent     Generalized abdominal pain       Relevant Orders   Diet NPO   Hypoglycemia/Hyperglycemia protocol   Emergency Medications   ECG and pulse monitoring    Monitor NIBP   Notify physician   Monitor O2 SATs   NPO status   Vital signs   Monitor NIBP, ECG and PSA02   Notify physician   Discharge home with responsible adult.   Discharge instructions   Esophagogastroduodenoscopy: verify informed consent   Esophageal dilation: verify informed consent     Change in bowel function       Relevant Orders   Diet NPO   Hypoglycemia/Hyperglycemia protocol   Emergency Medications   ECG and pulse monitoring    Monitor NIBP   Notify physician   Monitor O2 SATs   NPO status   Vital signs   Monitor NIBP, ECG and PSA02   Notify physician   Discharge home with responsible adult.   Discharge  instructions   Esophagogastroduodenoscopy: verify informed consent   Esophageal dilation: verify informed consent     Gastritis without bleeding, unspecified chronicity, unspecified gastritis type       Relevant Orders   Diet NPO   Hypoglycemia/Hyperglycemia protocol   Emergency Medications   ECG and pulse monitoring    Monitor NIBP   Notify physician   Monitor O2 SATs   NPO status   Vital signs   Monitor NIBP, ECG and PSA02   Notify physician   Discharge home with responsible adult.   Discharge instructions   Esophagogastroduodenoscopy: verify informed consent   Esophageal dilation: verify informed consent     Abdominal pain, epigastric       Relevant Orders   Diet NPO   Hypoglycemia/Hyperglycemia protocol   Emergency Medications   ECG and pulse monitoring    Monitor NIBP   Notify physician   Monitor O2 SATs   NPO status   Vital signs   Monitor NIBP, ECG and PSA02   Notify physician   Discharge home with  responsible adult.   Discharge instructions   Esophagogastroduodenoscopy: verify informed consent   Esophageal dilation: verify informed consent       No follow-ups on file.    Stein Windhorst M, CRNA

## 2024-05-13 NOTE — Op Note (Signed)
 [facility] Patient Name: Gina Wise Procedure Date: 05/13/2024 3:09 PM MRN: [id number] Endoscopist: Lynnie Bring , MD, [id number] Age: 63 Referring MD:  Date of Birth: [birth year] Gender: Female Account #: [id number] Procedure:                Upper GI endoscopy Indications:              Epigastric abdominal pain Medicines:                Monitored Anesthesia Care Procedure:                Pre-Anesthesia Assessment:                           - Prior to the procedure, a History and Physical                            was performed, and patient medications and                            allergies were reviewed. The patient's tolerance of                            previous anesthesia was also reviewed. The risks                            and benefits of the procedure and the sedation                            options and risks were discussed with the patient.                            All questions were answered, and informed consent                            was obtained. Prior Anticoagulants: The patient has                            taken no anticoagulant or antiplatelet agents. ASA                            Grade Assessment: II - A patient with mild systemic                            disease. After reviewing the risks and benefits,                            the patient was deemed in satisfactory condition to                            undergo the procedure.                           After obtaining informed consent, the endoscope was  passed under direct vision. Throughout the                            procedure, the patient's blood pressure, pulse, and                            oxygen saturations were monitored continuously. The                            GIF HQ190 #[id number] was introduced through the                            mouth, and advanced to the second part of duodenum.                            The upper GI endoscopy was  accomplished without                            difficulty. The patient tolerated the procedure                            well. Scope In: Scope Out: Findings:                 The examined esophagus was normal. Not                           The Z-line was regular and was found 38 cm from the                            incisors. No definite Schatzki's ring was noted. No                            esophageal strictures.                           A very small hiatal hernia was present.                           Evidence of a Nissen fundoplication was found in                            the cardia. The wrap appeared intact.                           Diffuse mild inflammation characterized by                            congestion (edema) and erythema was found in the                            entire examined stomach. Not Bx, since recent                            biopsies ([place] protocol) were negative for  intestinal metaplasia/H. pylori.                           A benign-appearing, intrinsic moderate stenosis was                            found at the pylorus with luminal diameter of                            approximately 8 mm. This was non-traversed with GIF                            H190(diameter 9.8 mm). A TTS dilator was passed                            through the scope. Dilation with a 12-13.5-15 mm                            pyloric balloon dilator was performed to maximum 15                            mm (held x 1 min each). The dilation site was                            examined and showed moderate mucosal disruption and                            moderate improvement in luminal narrowing.                            Estimated blood loss was minimal. Thereafter, the                            scope could be passed beyond easily.                           The examined duodenum was normal. Complications:            No immediate  complications. Estimated Blood Loss:     Estimated blood loss: none. 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                           - S/P Nissen's fundoplication with minimal residual                            hiatal hernia.                           - Otherwise normal EGD                           - No specimens collected. Recommendation:           - Patient has a contact number  available for                            emergencies. The signs and symptoms of potential                            delayed complications were discussed with the                            patient. Return to normal activities tomorrow.                            Written discharge instructions were provided to the                            patient.                           - Soft diet today and then advance as tolerated.                           - Continue present medications including Protonix                             40 BID                           - Await pathology results.                           - If continued problems, would recommend G-POEM.                           - The findings and recommendations were discussed                            with the patient's family. Lynnie Bring, MD 05/13/2024 3:54:14 PM This report has been signed electronically.

## 2024-05-14 ENCOUNTER — Telehealth: Payer: Self-pay

## 2024-05-14 ENCOUNTER — Other Ambulatory Visit (HOSPITAL_COMMUNITY): Payer: Self-pay

## 2024-05-14 NOTE — Telephone Encounter (Signed)
 Pharmacy Patient Advocate Encounter   Received notification from Patient Pharmacy that prior authorization for Pantoprazole  Sodium 40MG  dr tablets is required/requested.   Insurance verification completed.   The patient is insured through [facility] .   Per test claim: PA required; PA submitted to above mentioned insurance via CoverMyMeds Key/confirmation #/[facility] Status is pending

## 2024-05-14 NOTE — Telephone Encounter (Signed)
  Follow up Call-     05/13/2024    1:52 PM 01/01/2023    1:27 PM 04/25/2022    9:08 AM  Call back number  Post procedure Call Back phone  # [phone]  Permission to leave phone message Yes Yes Yes     Patient questions:  Do you have a fever, pain , or abdominal swelling? No. Pain Score  0 *  Have you tolerated food without any problems? Yes.    Have you been able to return to your normal activities? Yes.    Do you have any questions about your discharge instructions: Diet   No. Medications  No. Follow up visit  No.  Do you have questions or concerns about your Care? No.  Actions: * If pain score is 4 or above: No action needed, pain <4.

## 2024-05-15 NOTE — Telephone Encounter (Signed)
 Pharmacy Patient Advocate Encounter  Received notification from [employer] that Prior Authorization for Pantoprazole  Sodium 40MG  dr tablets has been APPROVED from 05-14-2024 to 05-14-2025   PA #/Case ID/Reference #: [facility]

## 2024-05-15 NOTE — Telephone Encounter (Addendum)
 That's a pyrtle patient unless they did a transfer in care

## 2024-05-19 NOTE — Telephone Encounter (Signed)
 Dr. Albertus is primary GI, scheduled with Dr. Charlanne due to availability.   Referral to Dr. Darin Dufault at [facility] was placed at time of office visit. Will send to CMA to follow up.

## 2024-05-19 NOTE — Telephone Encounter (Signed)
Dr Chales Abrahams pt

## 2024-05-22 NOTE — Telephone Encounter (Signed)
Dr Chales Abrahams pt

## 2024-05-27 ENCOUNTER — Telehealth: Payer: Self-pay | Admitting: Gastroenterology

## 2024-05-27 DIAGNOSIS — K311 Adult hypertrophic pyloric stenosis: Secondary | ICD-10-CM

## 2024-05-27 DIAGNOSIS — R112 Nausea with vomiting, unspecified: Secondary | ICD-10-CM

## 2024-05-27 NOTE — Telephone Encounter (Signed)
 Inbound call from patient stating she was referred to [facility] for her pyloric stenosis but when she called them to see to schedule an appt pt states the referral was sent out with incorrect notes. Patient states they informed her it was for issues of intake of food and she states its not intake she's having trouble with its for food coming out   Requesting a call back   Please advise  Thank you

## 2024-05-28 NOTE — Telephone Encounter (Signed)
 Corrected referral with [facility]. Patient informed.

## 2024-06-03 NOTE — Telephone Encounter (Signed)
 Inbound call from patient stating [facility] does not have an appointment until November. States she is already vomiting. States she is very concerned if she waits until November. Please advise, thank you

## 2024-06-03 NOTE — Telephone Encounter (Signed)
**Note De-identified  [place] Obfuscation** Please advise 

## 2024-06-03 NOTE — Telephone Encounter (Signed)
 Okay to repeat the upper endoscopy for pyloric dilation using a pyloric balloon in the [facility] while she waits on the [facility] referral I am not sure I have any procedures available but if another GI doctor has an open EGD slot we could ask for their help You could let me know who might be able to help and I can send them a direct message (if I do not have an EGD slot in the next several weeks)

## 2024-06-03 NOTE — Telephone Encounter (Signed)
 Left message for patient to call office.

## 2024-06-04 NOTE — Telephone Encounter (Signed)
 Patient scheduled for EGD on 06/05/24.

## 2024-06-04 NOTE — Addendum Note (Signed)
 Addended by: WILL POWELL CROME on: 06/04/2024 08:58 AM   Modules accepted: Orders

## 2024-06-05 ENCOUNTER — Ambulatory Visit (AMBULATORY_SURGERY_CENTER): Admitting: Internal Medicine

## 2024-06-05 ENCOUNTER — Encounter: Payer: Self-pay | Admitting: Internal Medicine

## 2024-06-05 VITALS — BP 156/79 | HR 68 | Temp 98.0°F | Resp 10 | Ht 66.0 in | Wt 164.0 lb

## 2024-06-05 DIAGNOSIS — K311 Adult hypertrophic pyloric stenosis: Secondary | ICD-10-CM | POA: Diagnosis present

## 2024-06-05 DIAGNOSIS — R1110 Vomiting, unspecified: Secondary | ICD-10-CM

## 2024-06-05 DIAGNOSIS — K449 Diaphragmatic hernia without obstruction or gangrene: Secondary | ICD-10-CM | POA: Diagnosis not present

## 2024-06-05 DIAGNOSIS — K2970 Gastritis, unspecified, without bleeding: Secondary | ICD-10-CM | POA: Diagnosis not present

## 2024-06-05 DIAGNOSIS — R112 Nausea with vomiting, unspecified: Secondary | ICD-10-CM

## 2024-06-05 MED ORDER — SODIUM CHLORIDE 0.9 % IV SOLN: 500.0000 mL | INTRAVENOUS | Status: DC

## 2024-06-05 NOTE — Progress Notes (Signed)
 Called to room to assist during endoscopic procedure.  Patient ID and intended procedure confirmed with present staff. Received instructions for my participation in the procedure from the performing physician.

## 2024-06-05 NOTE — Patient Instructions (Signed)
 YOU HAD AN ENDOSCOPIC PROCEDURE TODAY AT THE [facility]:   Refer to the procedure report that was given to you for any specific questions about what was found during the examination.  If the procedure report does not answer your questions, please call your gastroenterologist to clarify.  If you requested that your care partner not be given the details of your procedure findings, then the procedure report has been included in a sealed envelope for you to review at your convenience later.  YOU SHOULD EXPECT: Some feelings of bloating in the abdomen. Passage of more gas than usual.  Walking can help get rid of the air that was put into your GI tract during the procedure and reduce the bloating. If you had a lower endoscopy (such as a colonoscopy or flexible sigmoidoscopy) you may notice spotting of blood in your stool or on the toilet paper. If you underwent a bowel prep for your procedure, you may not have a normal bowel movement for a few days.  Please Note:  You might notice some irritation and congestion in your nose or some drainage.  This is from the oxygen used during your procedure.  There is no need for concern and it should clear up in a day or so.  SYMPTOMS TO REPORT IMMEDIATELY:   Following upper endoscopy (EGD)  Vomiting of blood or coffee ground material  New chest pain or pain under the shoulder blades  Painful or persistently difficult swallowing  New shortness of breath  Fever of 100F or higher  Black, tarry-looking stools  Advance diet as tolerated Continue present medications including pantoprazole  40 mg twice daily before meals Awaiting consult with Dr. Francois at [facility].  If this does not occur until Nov Nov as scheduled then repeat EGD in 4-6 weeks for dilation can be scheduled    For urgent or emergent issues, a gastroenterologist can be reached at any hour by calling [phone]. Do not use MyChart messaging for urgent concerns.    DIET:  We do  recommend a small meal at first, but then you may proceed to your regular diet.  Drink plenty of fluids but you should avoid alcoholic beverages for 24 hours.  ACTIVITY:  You should plan to take it easy for the rest of today and you should NOT DRIVE or use heavy machinery until tomorrow (because of the sedation medicines used during the test).    FOLLOW UP: Our staff will call the number listed on your records the next business day following your procedure.  We will call around 7:15- 8:00 am to check on you and address any questions or concerns that you may have regarding the information given to you following your procedure. If we do not reach you, we will leave a message.     If any biopsies were taken you will be contacted by phone or by letter within the next 1-3 weeks.  Please call us  at [phone] if you have not heard about the biopsies in 3 weeks.    SIGNATURES/CONFIDENTIALITY: You and/or your care partner have signed paperwork which will be entered into your electronic medical record.  These signatures attest to the fact that that the information above on your After Visit Summary has been reviewed and is understood.  Full responsibility of the confidentiality of this discharge information lies with you and/or your care-partner.

## 2024-06-05 NOTE — Op Note (Signed)
  [facility] Patient Name: Gina Wise Procedure Date: 06/05/2024 3:13 PM MRN: [id number] Endoscopist: Gordy CHRISTELLA Starch , MD, [id number] Age: 63 Referring MD:  Date of Birth: [birth year] Gender: Female Account #: [id number] Procedure:                Upper GI endoscopy Indications:              For therapy of pyloric stenosis, Abdominal                            distention, Nausea with vomiting, Regurgitation Medicines:                Monitored Anesthesia Care Procedure:                Pre-Anesthesia Assessment:                           - Prior to the procedure, a History and Physical                            was performed, and patient medications and                            allergies were reviewed. The patient's tolerance of                            previous anesthesia was also reviewed. The risks                            and benefits of the procedure and the sedation                            options and risks were discussed with the patient.                            All questions were answered, and informed consent                            was obtained. Prior Anticoagulants: The patient has                            taken no anticoagulant or antiplatelet agents. ASA                            Grade Assessment: III - A patient with severe                            systemic disease. After reviewing the risks and                            benefits, the patient was deemed in satisfactory                            condition to undergo the procedure.  After obtaining informed consent, the endoscope was                            passed under direct vision. Throughout the                            procedure, the patient's blood pressure, pulse, and                            oxygen saturations were monitored continuously. The                            Olympus scope [id number] was introduced through the                            mouth, and  advanced to the second part of duodenum.                            The upper GI endoscopy was accomplished without                            difficulty. The patient tolerated the procedure                            well. Scope In: Scope Out: Findings:                 The examined esophagus was normal.                           A small hiatal hernia was present.                           Evidence of a prior Nissen fundoplication was found                            in the cardia.                           Scattered moderate inflammation characterized by                            congestion (edema), erosions, erythema and                            granularity was found in the gastric body and in                            the gastric antrum.                           A benign-appearing, intrinsic moderate stenosis was                            found at the pylorus. This was traversed. A TTS  dilator was passed through the scope. Dilation with                            a 16.5 mm pyloric balloon dilator was performed.                            The dilation site was examined and showed moderate                            mucosal disruption.                           The examined duodenum was normal. Complications:            No immediate complications. Estimated Blood Loss:     Estimated blood loss was minimal. Impression:               - Normal esophagus.                           - Small hiatal hernia.                           - A Nissen fundoplication was found.                           - Gastritis.                           - Gastric stenosis was found at the pylorus.                            Dilated.                           - Normal examined duodenum.                           - No specimens collected. Recommendation:           - Patient has a contact number available for                            emergencies. The signs and symptoms of  potential                            delayed complications were discussed with the                            patient. Return to normal activities tomorrow.                            Written discharge instructions were provided to the                            patient.                           -  Advance diet as tolerated.                           - Continue present medications including                            pantoprazole  40 mg BID-AC.                           - Patient is awaiting consult with Dr. Francois at                            [facility]. If this does not occur until Nov as scheduled                            then repeat EGD in 4-6 weeks for dilation can be                            scheduled. Gordy CHRISTELLA Starch, MD 06/05/2024 3:43:16 PM This report has been signed electronically.

## 2024-06-05 NOTE — Progress Notes (Signed)
 GASTROENTEROLOGY PROCEDURE H&P NOTE   Primary Care Physician: Almarie Waddell NOVAK, NP    Reason for Procedure:  Nausea, vomiting abdominal bloating, known pyloric stenosis  Plan:    EGD with pyloric channel dilation  Patient is appropriate for endoscopic procedure(s) in the ambulatory ([facility]) setting.  The nature of the procedure, as well as the risks, benefits, and alternatives were carefully and thoroughly reviewed with the patient. Ample time for discussion and questions allowed. The patient understood, was satisfied, and agreed to proceed.     HPI: Gina Wise is a 63 y.o. female who presents for EGD.  Medical history as below.   No recent chest pain or shortness of breath.  No abdominal pain today.  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   Allergy    Anemia    Anxiety    Aortic atherosclerosis (HCC)    Bruising 05/10/2015   CAP (community acquired pneumonia) 08/03/2015   Chronic bronchitis (HCC)    get it q yr; in the spring time (02/17/2014)   COVID-19    Depression    have taken zoloft  since 1996 (02/17/2014)   Diverticulosis    Duodenal diverticulum    Dyspnea    from scar tissue   Epistaxis 12/01/2015   Gastritis    GERD (gastroesophageal reflux disease)    Hematemesis with nausea 08/01/2016   Hiatal hernia    High cholesterol    Hypertension    Hypothyroid    Internal hemorrhoids    Migraines    maybe a couple/yr (02/17/2014)   Multiple environmental allergies    Overweight(278.02)    Peri-menopause    PONV (postoperative nausea and vomiting)    Pre-diabetes    Schatzki's ring    Sjogren's disease (HCC)    Systemic lupus (HCC)     Past Surgical History:  Procedure Laterality Date   APPENDECTOMY  1987   BREAST BIOPSY Left 01/30/2018   BREAST EXCISIONAL BIOPSY Left 02/25/2018   ESOPHAGEAL MANOMETRY N/A 05/02/2022   Procedure: ESOPHAGEAL MANOMETRY (EM);  Surgeon: Albertus Gordy HERO, MD;  Location: [facility];   Service: Gastroenterology;  Laterality: N/A;   ESOPHAGOGASTRODUODENOSCOPY N/A 02/28/2014   Procedure: ESOPHAGOGASTRODUODENOSCOPY (EGD);  Surgeon: Gordy HERO Albertus, MD;  Location: [facility] ENDOSCOPY;  Service: Endoscopy;  Laterality: N/A;   ESOPHAGOGASTRODUODENOSCOPY N/A 08/02/2016   Procedure: ESOPHAGOGASTRODUODENOSCOPY (EGD);  Surgeon: Lupita FORBES Commander, MD;  Location: [facility] ENDOSCOPY;  Service: Endoscopy;  Laterality: N/A;   EXPLORATORY LAPAROTOMY  1987   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   FOOT SURGERY Right ~1980   don't know what they did; had to take something out   HERNIA REPAIR     INGUINAL HERNIA REPAIR Right ~ 2003   RADIOACTIVE SEED GUIDED EXCISIONAL BREAST BIOPSY Left 02/25/2018   Procedure: RADIOACTIVE SEED GUIDED EXCISIONAL BREAST BIOPSY;  Surgeon: Ebbie Cough, MD;  Location: [facility];  Service: General;  Laterality: Left;   SHOULDER OPEN ROTATOR CUFF REPAIR Left 1995   SHOULDER SURGERY Left 1997   cartilage   TONSILLECTOMY AND ADENOIDECTOMY  1976   TUBAL LIGATION  1998   laparoscopic   VAGINAL HYSTERECTOMY  04/08/2012   menorrhagia   VAGINAL PROLAPSE REPAIR  08/04/2018   Anterior and posterior with colp-mesh-Dr. Comer Ku [facility]   XI ROBOTIC ASSISTED HIATAL HERNIA REPAIR N/A 05/21/2022   Procedure: XI ROBOTIC ASSISTED HIATAL HERNIA REPAIR WITH TOUPET FUNDOPLICATION;  Surgeon: Signe Mitzie LABOR, MD;  Location: [facility];  Service:  General;  Laterality: N/A;    Prior to Admission medications   Medication Sig Start Date End Date Taking? Authorizing Provider  carboxymethylcellulose (REFRESH PLUS) 0.5 % SOLN Place 1 drop into both eyes 3 (three) times daily as needed (dry/irritated eyes).   Yes [provider]  cetirizine (ZYRTEC) 10 MG tablet Take 10 mg by mouth daily.   Yes [provider]  diphenhydrAMINE  (BENADRYL ) 25 MG tablet Take 25 mg by mouth every 6 (six) hours as needed for allergies.   Yes [provider]   hydrochlorothiazide  (HYDRODIURIL ) 25 MG tablet TAKE 1 TABLET(25 MG) BY MOUTH DAILY 05/11/24  Yes Almarie Waddell NOVAK, NP  levothyroxine  (SYNTHROID ) 150 MCG tablet Take 1 tablet (150 mcg total) by mouth daily. 08/13/23  Yes Almarie Waddell NOVAK, NP  ondansetron  (ZOFRAN -ODT) 4 MG disintegrating tablet Take 1 tablet (4 mg total) by mouth every 8 (eight) hours as needed for nausea or vomiting. 02/27/24  Yes Almarie Waddell NOVAK, NP  pantoprazole  (PROTONIX ) 40 MG tablet Take 1 tablet (40 mg total) by mouth daily. 09/10/23  Yes Almarie Waddell NOVAK, NP  pantoprazole  (PROTONIX ) 40 MG tablet Take 1 tablet (40 mg total) by mouth 2 (two) times daily. 05/13/24  Yes Charlanne Groom, MD  sertraline  (ZOLOFT ) 100 MG tablet TAKE ONE-HALF TABLET(50 MG) BY MOUTH EVERY DAY 05/11/24  Yes Almarie Waddell NOVAK, NP  SUMAtriptan  (IMITREX ) 100 MG tablet May repeat in 2 hours if headache persists(Plz sched with new provider) 01/10/24  Yes Almarie Waddell NOVAK, NP  acetaminophen  (TYLENOL ) 500 MG tablet Take 1,000 mg by mouth as needed. 12/26/16   [provider]  PREVIDENT 5000 SENSITIVE 1.1-5 % GEL Take 1 Application by mouth in the morning and at bedtime. 06/20/23   [provider]  promethazine  (PHENERGAN ) 12.5 MG tablet Take 1 tablet (12.5 mg total) by mouth every 8 (eight) hours as needed for nausea or vomiting. 02/27/24   Almarie Waddell NOVAK, NP    Current Outpatient Medications  Medication Sig Dispense Refill   carboxymethylcellulose (REFRESH PLUS) 0.5 % SOLN Place 1 drop into both eyes 3 (three) times daily as needed (dry/irritated eyes).     cetirizine (ZYRTEC) 10 MG tablet Take 10 mg by mouth daily.     diphenhydrAMINE  (BENADRYL ) 25 MG tablet Take 25 mg by mouth every 6 (six) hours as needed for allergies.     hydrochlorothiazide  (HYDRODIURIL ) 25 MG tablet TAKE 1 TABLET(25 MG) BY MOUTH DAILY 90 tablet 1   levothyroxine  (SYNTHROID ) 150 MCG tablet Take 1 tablet (150 mcg total) by mouth daily. 90 tablet 1   ondansetron  (ZOFRAN -ODT) 4 MG  disintegrating tablet Take 1 tablet (4 mg total) by mouth every 8 (eight) hours as needed for nausea or vomiting. 30 tablet 6   pantoprazole  (PROTONIX ) 40 MG tablet Take 1 tablet (40 mg total) by mouth daily. 90 tablet 3   pantoprazole  (PROTONIX ) 40 MG tablet Take 1 tablet (40 mg total) by mouth 2 (two) times daily. 180 tablet 2   sertraline  (ZOLOFT ) 100 MG tablet TAKE ONE-HALF TABLET(50 MG) BY MOUTH EVERY DAY 45 tablet 3   SUMAtriptan  (IMITREX ) 100 MG tablet May repeat in 2 hours if headache persists(Plz sched with new provider) 12 tablet 2   acetaminophen  (TYLENOL ) 500 MG tablet Take 1,000 mg by mouth as needed.     PREVIDENT 5000 SENSITIVE 1.1-5 % GEL Take 1 Application by mouth in the morning and at bedtime.     promethazine  (PHENERGAN ) 12.5 MG tablet Take 1 tablet (12.5  mg total) by mouth every 8 (eight) hours as needed for nausea or vomiting. 30 tablet 0   Current Facility-Administered Medications  Medication Dose Route Frequency Provider Last Rate Last Admin   0.9 %  sodium chloride  infusion  500 mL Intravenous Continuous Kaye Mitro, Gordy HERO, MD        Allergies as of 06/05/2024 - Review Complete 06/05/2024  Allergen Reaction Noted   Contrast media [iodinated contrast media] Hives and Itching 03/06/2016   Other Hives and Swelling 03/06/2016   Codeine Nausea Only    Erythromycin Nausea And Vomiting    Iohexol  Hives 02/12/2014    Family History  Problem Relation Age of Onset   Hypertension Father    Vision loss Father    Alcohol abuse Father    Emphysema Father        Smoker   Hearing loss Father    Early death Father        Murdered   Hypertension Mother    Heart disease Mother    Hepatitis Mother        Hep C   Endometriosis Mother    Pancreatic disease Mother    Depression Mother    Hearing loss Mother    Lupus Sister    Endometriosis Sister    Arthritis Sister    Depression Sister    Breast cancer Paternal Aunt        39's   Early death Paternal Aunt    Lung cancer  Paternal Uncle        smoker   Colon cancer Maternal Grandfather    Uterine cancer Maternal Aunt        50's   Emphysema Paternal Aunt        Non-smoker/Died of Pneumonia   Lung cancer Paternal Uncle    Heart attack Maternal Uncle    Parkinson's disease Maternal Grandmother    Alcohol abuse Maternal Grandmother    Heart disease Paternal Grandmother        Died during angioplasty surgery   Aneurysm Paternal Grandfather    Allergy (severe) Son        ASA-Idiopathic Thrombocytopenia   Early death Maternal Uncle        Murdered   Lung disease Paternal Aunt        Bronchiecstasis   Lung cancer Paternal Uncle    Early death Paternal Uncle        Carbon Monoxide Poisoning/Accidental   Stomach cancer Neg Hx    Pancreatic cancer Neg Hx     Social History   Socioeconomic History   Marital status: Single    Spouse name: Not on file   Number of children: 2   Years of education: Not on file   Highest education level: Associate degree: occupational, Scientist, product/process development, or vocational program  Occupational History   Not on file  Tobacco Use   Smoking status: Never   Smokeless tobacco: Never  Vaping Use   Vaping status: Never Used  Substance and Sexual Activity   Alcohol use: Not Currently    Alcohol/week: 0.0 standard drinks of alcohol    Comment: socially   Drug use: No   Sexual activity: Not on file  Other Topics Concern   Not on file  Social History Narrative   Works at [employer]   Social Drivers of Health   Financial Resource Strain: Medium Risk (12/19/2023)   Overall Financial Resource Strain (CARDIA)    Difficulty of Paying Living Expenses: Somewhat hard  Food Insecurity: Low  Risk  (02/20/2024)   Received from [facility]   Hunger Vital Sign    Within the past 12 months, you worried that your food would run out before you got money to buy more: Never true    Within the past 12 months, the food you bought just didn't last and you didn't have money to get more. : Never true   Recent Concern: Food Insecurity - Food Insecurity Present (12/19/2023)   Hunger Vital Sign    Worried About Running Out of Food in the Last Year: Sometimes true    Ran Out of Food in the Last Year: Never true  Transportation Needs: Unmet Transportation Needs (02/20/2024)   Received from [facility]    In the past 12 months, has lack of reliable transportation kept you from medical appointments, meetings, work or from getting things needed for daily living? : Yes  Physical Activity: Unknown (06/20/2023)   Exercise Vital Sign    Days of Exercise per Week: 0 days    Minutes of Exercise per Session: Not on file  Stress: No Stress Concern Present (06/20/2023)   [employer] of Occupational Health - Occupational Stress Questionnaire    Feeling of Stress : Only a little  Social Connections: Socially Isolated (12/19/2023)   Social Connection and Isolation Panel    Frequency of Communication with Friends and Family: More than three times a week    Frequency of Social Gatherings with Friends and Family: Twice a week    Attends Religious Services: Never    Database administrator or Organizations: No    Attends Engineer, structural: Not on file    Marital Status: Divorced  Intimate Partner Violence: Unknown (01/26/2022)   Received from [facility]   HITS    Physically Hurt: Not on file    Insult or Talk Down To: Not on file    Threaten Physical Harm: Not on file    Scream or Curse: Not on file    Physical Exam: Vital signs in last 24 hours: @BP  134/86   Pulse 84   Temp 98 F (36.7 C)   Ht 5' 6 (1.676 m)   Wt 164 lb (74.4 kg)   LMP 04/03/2012 Comment: Still has Cervix and Ovaries  SpO2 99%   BMI 26.47 kg/m  GEN: NAD EYE: Sclerae anicteric ENT: MMM CV: Non-tachycardic Pulm: CTA b/l GI: Soft, NT/ND NEURO:  Alert & Oriented x 3   Gordy Starch, MD [facility]  06/05/2024 3:12 PM

## 2024-06-05 NOTE — Progress Notes (Signed)
 Pt's states no medical or surgical changes since previsit or office visit.

## 2024-06-05 NOTE — Progress Notes (Signed)
 Pt sedate, gd SR's, VSS, report to RN

## 2024-06-08 ENCOUNTER — Telehealth: Payer: Self-pay

## 2024-06-08 NOTE — Telephone Encounter (Signed)
 Patient is returning call stating that she is doing good since her procedure. Please advise.

## 2024-06-08 NOTE — Telephone Encounter (Signed)
 No answer, left message to call if having any issues or concerns, B.Vester Titsworth RN

## 2024-06-18 ENCOUNTER — Ambulatory Visit: Admitting: Internal Medicine

## 2024-06-18 ENCOUNTER — Encounter: Payer: Self-pay | Admitting: Internal Medicine

## 2024-06-18 ENCOUNTER — Other Ambulatory Visit (HOSPITAL_COMMUNITY): Payer: Self-pay

## 2024-06-18 ENCOUNTER — Ambulatory Visit (INDEPENDENT_AMBULATORY_CARE_PROVIDER_SITE_OTHER): Admitting: Internal Medicine

## 2024-06-18 VITALS — BP 120/80 | HR 96 | Ht 66.0 in | Wt 166.0 lb

## 2024-06-18 DIAGNOSIS — Z8619 Personal history of other infectious and parasitic diseases: Secondary | ICD-10-CM | POA: Diagnosis not present

## 2024-06-18 DIAGNOSIS — R197 Diarrhea, unspecified: Secondary | ICD-10-CM

## 2024-06-18 DIAGNOSIS — K311 Adult hypertrophic pyloric stenosis: Secondary | ICD-10-CM | POA: Diagnosis not present

## 2024-06-18 DIAGNOSIS — R1110 Vomiting, unspecified: Secondary | ICD-10-CM | POA: Diagnosis not present

## 2024-06-18 DIAGNOSIS — R112 Nausea with vomiting, unspecified: Secondary | ICD-10-CM

## 2024-06-18 MED ORDER — PROMETHAZINE HCL 12.5 MG PO TABS: 12.5000 mg | ORAL_TABLET | Freq: Three times a day (TID) | ORAL | 1 refills | Status: DC | PRN

## 2024-06-18 MED ORDER — DICYCLOMINE HCL 20 MG PO TABS: 20.0000 mg | ORAL_TABLET | Freq: Three times a day (TID) | ORAL | 1 refills | Status: AC | PRN

## 2024-06-18 NOTE — Patient Instructions (Signed)
 We have sent the following medications to your pharmacy for you to pick up at your convenience: dicyclomine , phenergan .  Increase your zofran  up to 8 mg three times a day.   Continue pantoprazole  40 mg daily.  Your provider has ordered [facility] stool testing for you. You have received a kit from our office today containing all necessary supplies to complete this test. Please carefully read the stool collection instructions provided in the kit before opening the accompanying materials. In addition, be sure there is a label providing your full name and date of birth on the puritan opti-swab tube that is supplied in the kit (if you do not see a label with this information on your test tube, please make us  aware before test collection!). After completing the test, you should secure the purtian tube into the specimen biohazard bag. The [facility] E-Req sheet (including date and time of specimen collection) should be placed into the outside pocket of the specimen biohazard bag and returned to the [facility] lab (basement floor of [facility]) within 3 days of collection. Please make sure to give the specimen to a staff member at the lab. DO NOT leave the specimen on the counter.   If the specimen date and time (can be found in the upper right boxed portion of the sheet) are not filled out on the E-Req sheet, the test will NOT be performed.   _______________________________________________________  If your blood pressure at your visit was 140/90 or greater, please contact your primary care physician to follow up on this.  _______________________________________________________  If you are age 43 or older, your body mass index should be between 23-30. Your Body mass index is 26.79 kg/m. If this is out of the aforementioned range listed, please consider follow up with your Primary Care Provider.  If you are age 47 or younger, your body mass index should be between 19-25.  Your Body mass index is 26.79 kg/m. If this is out of the aformentioned range listed, please consider follow up with your Primary Care Provider.   ________________________________________________________  The [facility] providers would like to encourage you to use MYCHART to communicate with providers for non-urgent requests or questions.  Due to long hold times on the telephone, sending your provider a message by [facility] may be a faster and more efficient way to get a response.  Please allow 48 business hours for a response.  Please remember that this is for non-urgent requests.  _______________________________________________________  [facility] is using a team-based approach to care.  Your team is made up of your doctor and two to three APPS. Our APPS (Nurse Practitioners and Physician Assistants) work with your physician to ensure care continuity for you. They are fully qualified to address your health concerns and develop a treatment plan. They communicate directly with your gastroenterologist to care for you. Seeing the Advanced Practice Practitioners on your physician's team can help you by facilitating care more promptly, often allowing for earlier appointments, access to diagnostic testing, procedures, and other specialty referrals.

## 2024-06-18 NOTE — Progress Notes (Signed)
 Subjective:    Patient ID: Gina Wise, female    DOB: 06-Jun-1961, 63 y.o.   MRN: [id number]  HPI Gina Wise is a 63 year old female with c diff colitis s/p FMT, GERD, hx of HH repair x 2, colonic diverticulosis and pyloric stenosis who presents with bloody diarrhea and abdominal pain.  She experienced a significant episode of bloody diarrhea starting on Tuesday night around 10 PM, which persisted nonstop for approximately 10 hours. This was accompanied by vomiting, lower abdominal pain, and nausea. The pain was described as very uncomfortable, and she had not experienced a colitis flare since April.  She recalls a previous hospital stay (record reviewed previous 2001 and then again in [facility] a year or so ago) where a scan indicated colitis, but a subsequent flexible sigmoidoscopy did not show colitis at that time. Her symptoms seem to occur sporadically. She has a history of C. difficile infection, with a positive test following a colonoscopy in 2021, which showed acute but not chronic inflammation. She has not been taking antibiotics recently, except when necessary for active infections or surgeries.  Current symptoms include lower abdominal pain, nausea, and the presence of mucus with blood in her stools. Eating chicken soup, mainly broth, exacerbated her nausea. She has been using Zofran  4 mg for nausea, which provides some relief, and Phenergan  as a backup, although it causes drowsiness. She has stopped drinking Coke, suspecting it might have triggered previous episodes.  She is currently on pantoprazole  40 mg BID for pyloric stenosis, which was last followed up with an upper endoscopy on June 05, 2024, showing moderate pyloric stenosis dilated to 16.5 mm with moderate mucosal disruption.   Review of Systems As per HPI, otherwise negative  Current Medications, Allergies, Past Medical History, Past Surgical History, Family History and Social History were reviewed in [facility] record.    Objective:   Physical Exam BP 120/80   Pulse 96   Ht 5' 6 (1.676 m)   Wt 166 lb (75.3 kg)   LMP 04/03/2012 Comment: Still has Cervix and Ovaries  BMI 26.79 kg/m  Gen: awake, alert, NAD HEENT: anicteric  Abd: soft, NT/ND, +BS throughout Ext: no c/c/e Neuro: nonfocal  DIAGNOSTIC Colonoscopy: Multiple diverticula in the sigmoid, hepatic flexure, and ascending colon with an area of sharp angulation in the sigmoid. (01/01/2023) Upper endoscopy: Normal esophagus, small hiatal hernia, scattered gastric erosions, benign appearing moderate pyloric stenosis dilated to 16.5 mm with moderate mucosal disruption. (06/05/2024)  PATHOLOGY Colonoscopy biopsy: Acute but not chronic inflammation. (2021)      Assessment & Plan:   Acute bloody diarrhea with abdominal pain and vomiting Symptoms improved, suggesting viral or bacterial etiology. Diverticulosis present. Current symptoms do not necessarily indicate C. difficile infection. - Order stool swab for infection, including virus, bacteria, and C. difficile PCR for toxin. - Check blood counts for blood loss. - Prescribe dicyclomine  20 mg TID PRN for abdominal cramping. - Increase Zofran  to 8 mg TID as needed. - Renew Phenergan  prescription for nausea for breakthrough nausea not responsive to ondansetron .  Pyloric stenosis, benign, status post dilation Nausea and discomfort likely related to recent acute gastrointestinal symptoms rather than pyloric stenosis. Scheduled for follow-up dilation in September. - Maintain pantoprazole  40 mg BID. - Proceed with dilation on September 11th unless symptoms necessitate rescheduling. - She has an appointment with Dr. Delman at [facility] in November for consideration of G-POEM  History of Clostridioides difficile infection Previous infection in  2021 and 2024. Current symptoms do not strongly suggest C. difficile, but testing will include PCR for toxin to rule out active  infection. - Include C. difficile PCR for toxin in stool swab testing. - Previous excellent response to [facility]  30 minutes total spent today including patient facing time, coordination of care, reviewing medical history/procedures/pertinent radiology studies, and documentation of the encounter.

## 2024-06-19 ENCOUNTER — Encounter: Payer: Self-pay | Admitting: Internal Medicine

## 2024-06-23 ENCOUNTER — Other Ambulatory Visit (INDEPENDENT_AMBULATORY_CARE_PROVIDER_SITE_OTHER)

## 2024-06-23 DIAGNOSIS — R197 Diarrhea, unspecified: Secondary | ICD-10-CM | POA: Diagnosis not present

## 2024-06-23 DIAGNOSIS — Z8619 Personal history of other infectious and parasitic diseases: Secondary | ICD-10-CM

## 2024-06-24 ENCOUNTER — Other Ambulatory Visit (HOSPITAL_COMMUNITY): Payer: Self-pay

## 2024-06-25 ENCOUNTER — Telehealth: Payer: Self-pay

## 2024-06-25 NOTE — Telephone Encounter (Signed)
 Received faxed stool test result from [facility] lab for patient's Gastrointestinal panel with C. Diff. The panel states GI panel with C. Diff is not detected in patient's stool. Paper copy placed on Dr. Pamula desk for review.

## 2024-06-26 NOTE — Telephone Encounter (Signed)
 Patient notified [facility] stool test result were negative. Patient verbalized understanding.

## 2024-06-26 NOTE — Telephone Encounter (Signed)
 Please let pt know c diff and other infectious panel was negative, though her symptoms were likely related to an unknown acute infection which should be self limited JMP

## 2024-07-02 ENCOUNTER — Ambulatory Visit (AMBULATORY_SURGERY_CENTER): Admitting: Internal Medicine

## 2024-07-02 ENCOUNTER — Encounter: Payer: Self-pay | Admitting: Internal Medicine

## 2024-07-02 VITALS — BP 130/79 | HR 78 | Temp 98.1°F | Resp 14 | Ht 66.0 in | Wt 166.0 lb

## 2024-07-02 DIAGNOSIS — Z9889 Other specified postprocedural states: Secondary | ICD-10-CM | POA: Diagnosis not present

## 2024-07-02 DIAGNOSIS — K449 Diaphragmatic hernia without obstruction or gangrene: Secondary | ICD-10-CM

## 2024-07-02 DIAGNOSIS — K311 Adult hypertrophic pyloric stenosis: Secondary | ICD-10-CM | POA: Diagnosis present

## 2024-07-02 DIAGNOSIS — R112 Nausea with vomiting, unspecified: Secondary | ICD-10-CM

## 2024-07-02 DIAGNOSIS — R140 Abdominal distension (gaseous): Secondary | ICD-10-CM

## 2024-07-02 MED ORDER — SODIUM CHLORIDE 0.9 % IV SOLN: 500.0000 mL | Freq: Once | INTRAVENOUS | Status: DC

## 2024-07-02 NOTE — Progress Notes (Signed)
 Pt's states no medical or surgical changes since previsit or office visit.

## 2024-07-02 NOTE — Patient Instructions (Signed)
 YOU HAD AN ENDOSCOPIC PROCEDURE TODAY AT THE [facility]:   Refer to the procedure report that was given to you for any specific questions about what was found during the examination.  If the procedure report does not answer your questions, please call your gastroenterologist to clarify.  If you requested that your care partner not be given the details of your procedure findings, then the procedure report has been included in a sealed envelope for you to review at your convenience later.  YOU SHOULD EXPECT: Some feelings of bloating in the abdomen. Passage of more gas than usual.  Walking can help get rid of the air that was put into your GI tract during the procedure and reduce the bloating. If you had a lower endoscopy (such as a colonoscopy or flexible sigmoidoscopy) you may notice spotting of blood in your stool or on the toilet paper. If you underwent a bowel prep for your procedure, you may not have a normal bowel movement for a few days.  Please Note:  You might notice some irritation and congestion in your nose or some drainage.  This is from the oxygen used during your procedure.  There is no need for concern and it should clear up in a day or so.  SYMPTOMS TO REPORT IMMEDIATELY:   Following upper endoscopy (EGD)  Vomiting of blood or coffee ground material  New chest pain or pain under the shoulder blades  Painful or persistently difficult swallowing  New shortness of breath  Fever of 100F or higher  Black, tarry-looking stools  Resume previous diet Continue present medications Repeat upper endoscopy in 4-6 weeks for retreatment  For urgent or emergent issues, a gastroenterologist can be reached at any hour by calling [phone]. Do not use MyChart messaging for urgent concerns.    DIET:  We do recommend a small meal at first, but then you may proceed to your regular diet.  Drink plenty of fluids but you should avoid alcoholic beverages for 24 hours.  ACTIVITY:   You should plan to take it easy for the rest of today and you should NOT DRIVE or use heavy machinery until tomorrow (because of the sedation medicines used during the test).    FOLLOW UP: Our staff will call the number listed on your records the next business day following your procedure.  We will call around 7:15- 8:00 am to check on you and address any questions or concerns that you may have regarding the information given to you following your procedure. If we do not reach you, we will leave a message.     If any biopsies were taken you will be contacted by phone or by letter within the next 1-3 weeks.  Please call us  at [phone] if you have not heard about the biopsies in 3 weeks.    SIGNATURES/CONFIDENTIALITY: You and/or your care partner have signed paperwork which will be entered into your electronic medical record.  These signatures attest to the fact that that the information above on your After Visit Summary has been reviewed and is understood.  Full responsibility of the confidentiality of this discharge information lies with you and/or your care-partner.

## 2024-07-02 NOTE — Progress Notes (Signed)
 To pacu, VSS. Report to [email or web address]

## 2024-07-02 NOTE — Op Note (Signed)
 [facility] Patient Name: Gina Wise Procedure Date: 07/02/2024 11:28 AM MRN: [id number] Endoscopist: Gordy CHRISTELLA Starch , MD, [id number] Age: 63 Referring MD:  Date of Birth: 1961-05-30 Gender: Female Account #: [id number] Procedure:                Upper GI endoscopy Indications:              For therapy of pyloric stenosis (dilated to 15.5 mm                            with balloon on 06/08/24 with improvement of                            symptoms x 2 weeks) Medicines:                Monitored Anesthesia Care Procedure:                Pre-Anesthesia Assessment:                           - Prior to the procedure, a History and Physical                            was performed, and patient medications and                            allergies were reviewed. The patient's tolerance of                            previous anesthesia was also reviewed. The risks                            and benefits of the procedure and the sedation                            options and risks were discussed with the patient.                            All questions were answered, and informed consent                            was obtained. Prior Anticoagulants: The patient has                            taken no anticoagulant or antiplatelet agents. ASA                            Grade Assessment: II - A patient with mild systemic                            disease. After reviewing the risks and benefits,                            the patient was deemed in satisfactory condition to  undergo the procedure.                           After obtaining informed consent, the endoscope was                            passed under direct vision. Throughout the                            procedure, the patient's blood pressure, pulse, and                            oxygen saturations were monitored continuously. The                            Olympus Scope SN [id number] was introduced  through the                            mouth, and advanced to the second part of duodenum.                            The upper GI endoscopy was accomplished without                            difficulty. The patient tolerated the procedure                            well. Scope In: Scope Out: Findings:                 The examined esophagus was normal.                           A small hiatal hernia was present.                           Evidence of a prior Nissen fundoplication was found                            in the cardia and in the gastric fundus. This was                            characterized by healthy appearing mucosa.                           A benign-appearing, intrinsic mild stenosis was                            found at the pylorus. This was traversed. A TTS                            dilator was passed through the scope. Dilation with                            a 13.5-14.5-15.5 mm pyloric balloon dilator  was                            performed. The dilation site was examined and                            showed mild mucosal disruption.                           The examined duodenum was normal. Complications:            No immediate complications. Estimated Blood Loss:     Estimated blood loss was minimal. Impression:               - Normal esophagus.                           - Small hiatal hernia.                           - A Nissen fundoplication was found, characterized                            by healthy appearing mucosa.                           - Gastric stenosis was found at the pylorus.                            Dilated to 15.5 mm with balloon.                           - Normal examined duodenum.                           - No specimens collected. Recommendation:           - Patient has a contact number available for                            emergencies. The signs and symptoms of potential                            delayed complications were  discussed with the                            patient. Return to normal activities tomorrow.                            Written discharge instructions were provided to the                            patient.                           - Resume previous diet.                           -  Continue present medications.                           - Repeat upper endoscopy in 4-6 weeks for                            retreatment unless appointment with Dr. Francois                            occurs 1st. Gordy CHRISTELLA Starch, MD 07/02/2024 12:16:19 PM This report has been signed electronically.

## 2024-07-02 NOTE — Progress Notes (Signed)
 See note dated 06/18/2024 for details and current H&P  Patient presenting for upper endoscopy for dilation of pyloric stenosis  She remains appropriate for upper endoscopy in the [facility] today.

## 2024-07-02 NOTE — Progress Notes (Signed)
 Called to room to assist during endoscopic procedure.  Patient ID and intended procedure confirmed with present staff. Received instructions for my participation in the procedure from the performing physician.

## 2024-07-03 ENCOUNTER — Telehealth: Payer: Self-pay

## 2024-07-03 NOTE — Telephone Encounter (Signed)
 Follow up call to pt, no answer.

## 2024-07-06 ENCOUNTER — Encounter: Payer: Self-pay | Admitting: Internal Medicine

## 2024-08-06 ENCOUNTER — Encounter: Payer: Self-pay | Admitting: Internal Medicine

## 2024-08-06 ENCOUNTER — Ambulatory Visit (AMBULATORY_SURGERY_CENTER): Admitting: Internal Medicine

## 2024-08-06 VITALS — BP 137/75 | HR 69 | Temp 97.9°F | Resp 19

## 2024-08-06 DIAGNOSIS — Z9889 Other specified postprocedural states: Secondary | ICD-10-CM | POA: Diagnosis not present

## 2024-08-06 DIAGNOSIS — K2980 Duodenitis without bleeding: Secondary | ICD-10-CM

## 2024-08-06 DIAGNOSIS — K2970 Gastritis, unspecified, without bleeding: Secondary | ICD-10-CM | POA: Diagnosis present

## 2024-08-06 DIAGNOSIS — K311 Adult hypertrophic pyloric stenosis: Secondary | ICD-10-CM

## 2024-08-06 DIAGNOSIS — K449 Diaphragmatic hernia without obstruction or gangrene: Secondary | ICD-10-CM

## 2024-08-06 MED ORDER — SODIUM CHLORIDE 0.9 % IV SOLN: 500.0000 mL | Freq: Once | INTRAVENOUS | Status: DC

## 2024-08-06 NOTE — Progress Notes (Signed)
 Called to room to assist during endoscopic procedure.  Patient ID and intended procedure confirmed with present staff. Received instructions for my participation in the procedure from the performing physician.

## 2024-08-06 NOTE — Op Note (Signed)
 [facility] Patient Name: Gina Wise Procedure Date: 08/06/2024 2:23 PM MRN: [id number] Endoscopist: Gordy CHRISTELLA Starch , MD, [id number] Age: 63 Referring MD:  Date of Birth: 10-17-1961 Gender: Female Account #: [id number] Procedure:                Upper GI endoscopy Indications:              Epigastric abdominal pain, For therapy of pyloric                            stenosis, Nausea with vomiting Medicines:                Monitored Anesthesia Care Procedure:                Pre-Anesthesia Assessment:                           - Prior to the procedure, a History and Physical                            was performed, and patient medications and                            allergies were reviewed. The patient's tolerance of                            previous anesthesia was also reviewed. The risks                            and benefits of the procedure and the sedation                            options and risks were discussed with the patient.                            All questions were answered, and informed consent                            was obtained. Prior Anticoagulants: The patient has                            taken no anticoagulant or antiplatelet agents. ASA                            Grade Assessment: II - A patient with mild systemic                            disease. After reviewing the risks and benefits,                            the patient was deemed in satisfactory condition to                            undergo the procedure.  After obtaining informed consent, the endoscope was                            passed under direct vision. Throughout the                            procedure, the patient's blood pressure, pulse, and                            oxygen saturations were monitored continuously. The                            Olympus scope [id number] was introduced through the                            mouth, and advanced to the  second part of duodenum.                            The upper GI endoscopy was accomplished without                            difficulty. The patient tolerated the procedure                            well. Scope In: Scope Out: Findings:                 The examined esophagus was normal.                           A small hiatal hernia was present.                           Evidence of a Nissen fundoplication was found in                            the cardia. The wrap appeared intact.                           Scattered moderate inflammation characterized by                            erosions, erythema and granularity was found in the                            gastric body and in the gastric antrum.                           A benign-appearing, intrinsic moderate stenosis was                            found at the pylorus. This was traversed. A TTS                            dilator was passed through the scope. Dilation  with                            an 18 mm pyloric balloon dilator was performed. The                            dilation site was examined and showed moderate                            mucosal disruption.                           Patchy moderate inflammation characterized by                            congestion (edema), erosions and erythema was found                            in the duodenal bulb. Complications:            No immediate complications. Estimated Blood Loss:     Estimated blood loss was minimal. Impression:               - Normal esophagus.                           - Small hiatal hernia.                           - A Nissen fundoplication was found. The wrap                            appears intact.                           - Gastritis.                           - Gastric stenosis was found at the pylorus.                            Dilated to 18 mm with balloon.                           - Duodenitis.                           - No specimens  collected. Recommendation:           - Patient has a contact number available for                            emergencies. The signs and symptoms of potential                            delayed complications were discussed with the  patient. Return to normal activities tomorrow.                            Written discharge instructions were provided to the                            patient.                           - Resume previous diet.                           - Continue present medications including                            pantoprazole  40 mg BID-AC.                           - Patient has appt at [facility] with Dr. Francois in 2                            weeks for his opinion regarding recurrent pyloric                            channel stenosis and consideration of G-POEM. Gordy CHRISTELLA Starch, MD 08/06/2024 3:01:17 PM This report has been signed electronically.

## 2024-08-06 NOTE — Progress Notes (Signed)
 Vss nad trans to pacu

## 2024-08-06 NOTE — Progress Notes (Signed)
 GASTROENTEROLOGY PROCEDURE H&P NOTE   Primary Care Physician: Almarie Waddell NOVAK, NP    Reason for Procedure:  Pyloric stenosis dilation  Plan:    EGD with dilation  Patient is appropriate for endoscopic procedure(s) in the ambulatory ([facility]) setting.  The nature of the procedure, as well as the risks, benefits, and alternatives were carefully and thoroughly reviewed with the patient. Ample time for discussion and questions allowed. The patient understood, was satisfied, and agreed to proceed.     HPI: Gina Wise is a 63 y.o. female who presents for EGD.  Medical history as below.  No recent chest pain or shortness of breath.  No abdominal pain today.  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   Allergy    Anemia    Anxiety    Aortic atherosclerosis    Bruising 05/10/2015   CAP (community acquired pneumonia) 08/03/2015   Chronic bronchitis (HCC)    get it q yr; in the spring time (02/17/2014)   COVID-19    Depression    have taken zoloft  since 1996 (02/17/2014)   Diverticulosis    Duodenal diverticulum    Dyspnea    from scar tissue   Epistaxis 12/01/2015   Gastritis    GERD (gastroesophageal reflux disease)    Hematemesis with nausea 08/01/2016   Hiatal hernia    High cholesterol    Hypertension    Hypothyroid    Internal hemorrhoids    Migraines    maybe a couple/yr (02/17/2014)   Multiple environmental allergies    Overweight(278.02)    Peri-menopause    PONV (postoperative nausea and vomiting)    Pre-diabetes    Schatzki's ring    Sjogren's disease    Systemic lupus (HCC)     Past Surgical History:  Procedure Laterality Date   APPENDECTOMY  1987   BREAST BIOPSY Left 01/30/2018   BREAST EXCISIONAL BIOPSY Left 02/25/2018   ESOPHAGEAL MANOMETRY N/A 05/02/2022   Procedure: ESOPHAGEAL MANOMETRY (EM);  Surgeon: Albertus Gordy HERO, MD;  Location: [facility];  Service: Gastroenterology;  Laterality: N/A;    ESOPHAGOGASTRODUODENOSCOPY N/A 02/28/2014   Procedure: ESOPHAGOGASTRODUODENOSCOPY (EGD);  Surgeon: Gordy HERO Albertus, MD;  Location: [facility] ENDOSCOPY;  Service: Endoscopy;  Laterality: N/A;   ESOPHAGOGASTRODUODENOSCOPY N/A 08/02/2016   Procedure: ESOPHAGOGASTRODUODENOSCOPY (EGD);  Surgeon: Lupita FORBES Commander, MD;  Location: [facility] ENDOSCOPY;  Service: Endoscopy;  Laterality: N/A;   EXPLORATORY LAPAROTOMY  1987   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   FOOT SURGERY Right ~1980   don't know what they did; had to take something out   HERNIA REPAIR     INGUINAL HERNIA REPAIR Right ~ 2003   RADIOACTIVE SEED GUIDED EXCISIONAL BREAST BIOPSY Left 02/25/2018   Procedure: RADIOACTIVE SEED GUIDED EXCISIONAL BREAST BIOPSY;  Surgeon: Ebbie Cough, MD;  Location: [facility];  Service: General;  Laterality: Left;   SHOULDER OPEN ROTATOR CUFF REPAIR Left 1995   SHOULDER SURGERY Left 1997   cartilage   TONSILLECTOMY AND ADENOIDECTOMY  1976   TUBAL LIGATION  1998   laparoscopic   VAGINAL HYSTERECTOMY  04/08/2012   menorrhagia   VAGINAL PROLAPSE REPAIR  08/04/2018   Anterior and posterior with colp-mesh-Dr. Comer Ku @ [facility]   XI ROBOTIC ASSISTED HIATAL HERNIA REPAIR N/A 05/21/2022   Procedure: XI ROBOTIC ASSISTED HIATAL HERNIA REPAIR WITH TOUPET FUNDOPLICATION;  Surgeon: Signe Mitzie LABOR, MD;  Location: [facility];  Service: General;  Laterality: N/A;    Prior to  Admission medications   Medication Sig Start Date End Date Taking? Authorizing Provider  acetaminophen  (TYLENOL ) 500 MG tablet Take 1,000 mg by mouth as needed. 12/26/16  Yes [provider]  carboxymethylcellulose (REFRESH PLUS) 0.5 % SOLN Place 1 drop into both eyes 3 (three) times daily as needed (dry/irritated eyes).   Yes [provider]  cetirizine (ZYRTEC) 10 MG tablet Take 10 mg by mouth daily.   Yes [provider]  diphenhydrAMINE  (BENADRYL ) 25 MG tablet Take 25 mg by mouth every 6 (six)  hours as needed for allergies.   Yes [provider]  hydrochlorothiazide  (HYDRODIURIL ) 25 MG tablet TAKE 1 TABLET(25 MG) BY MOUTH DAILY 05/11/24  Yes Almarie Birmingham B, NP  levothyroxine  (SYNTHROID ) 150 MCG tablet Take 1 tablet (150 mcg total) by mouth daily. 08/13/23  Yes Almarie Birmingham NOVAK, NP  ondansetron  (ZOFRAN -ODT) 4 MG disintegrating tablet Take 1 tablet (4 mg total) by mouth every 8 (eight) hours as needed for nausea or vomiting. 02/27/24  Yes Almarie Birmingham NOVAK, NP  pantoprazole  (PROTONIX ) 40 MG tablet Take 1 tablet (40 mg total) by mouth 2 (two) times daily. 05/13/24  Yes Charlanne Groom, MD  promethazine  (PHENERGAN ) 12.5 MG tablet Take 1 tablet (12.5 mg total) by mouth every 8 (eight) hours as needed for nausea or vomiting. 06/18/24  Yes Loraina Stauffer, Gordy HERO, MD  sertraline  (ZOLOFT ) 100 MG tablet TAKE ONE-HALF TABLET(50 MG) BY MOUTH EVERY DAY 05/11/24  Yes Almarie Birmingham NOVAK, NP  dicyclomine  (BENTYL ) 20 MG tablet Take 1 tablet (20 mg total) by mouth 3 (three) times daily as needed for spasms. 06/18/24   Albertus Gordy HERO, MD  SUMAtriptan  (IMITREX ) 100 MG tablet May repeat in 2 hours if headache persists(Plz sched with new provider) 01/10/24   Almarie Birmingham NOVAK, NP    Current Outpatient Medications  Medication Sig Dispense Refill   acetaminophen  (TYLENOL ) 500 MG tablet Take 1,000 mg by mouth as needed.     carboxymethylcellulose (REFRESH PLUS) 0.5 % SOLN Place 1 drop into both eyes 3 (three) times daily as needed (dry/irritated eyes).     cetirizine (ZYRTEC) 10 MG tablet Take 10 mg by mouth daily.     diphenhydrAMINE  (BENADRYL ) 25 MG tablet Take 25 mg by mouth every 6 (six) hours as needed for allergies.     hydrochlorothiazide  (HYDRODIURIL ) 25 MG tablet TAKE 1 TABLET(25 MG) BY MOUTH DAILY 90 tablet 1   levothyroxine  (SYNTHROID ) 150 MCG tablet Take 1 tablet (150 mcg total) by mouth daily. 90 tablet 1   ondansetron  (ZOFRAN -ODT) 4 MG disintegrating tablet Take 1 tablet (4 mg total) by mouth every 8 (eight) hours as  needed for nausea or vomiting. 30 tablet 6   pantoprazole  (PROTONIX ) 40 MG tablet Take 1 tablet (40 mg total) by mouth 2 (two) times daily. 180 tablet 2   promethazine  (PHENERGAN ) 12.5 MG tablet Take 1 tablet (12.5 mg total) by mouth every 8 (eight) hours as needed for nausea or vomiting. 30 tablet 1   sertraline  (ZOLOFT ) 100 MG tablet TAKE ONE-HALF TABLET(50 MG) BY MOUTH EVERY DAY 45 tablet 3   dicyclomine  (BENTYL ) 20 MG tablet Take 1 tablet (20 mg total) by mouth 3 (three) times daily as needed for spasms. 90 tablet 1   SUMAtriptan  (IMITREX ) 100 MG tablet May repeat in 2 hours if headache persists(Plz sched with new provider) 12 tablet 2   Current Facility-Administered Medications  Medication Dose Route Frequency Provider Last Rate Last Admin   0.9 %  sodium chloride  infusion  500 mL Intravenous Once Roshunda Keir, Gordy HERO, MD        Allergies as of 08/06/2024 - Review Complete 08/06/2024  Allergen Reaction Noted   Contrast media [iodinated contrast media] Hives and Itching 03/06/2016   Other Hives and Swelling 03/06/2016   Codeine Nausea Only    Erythromycin Nausea And Vomiting    Iohexol  Hives 02/12/2014    Family History  Problem Relation Age of Onset   Hypertension Mother    Heart disease Mother    Hepatitis Mother        Hep C   Endometriosis Mother    Pancreatic disease Mother    Depression Mother    Hearing loss Mother    Hypertension Father    Vision loss Father    Alcohol abuse Father    Emphysema Father        Smoker   Hearing loss Father    Early death Father        Murdered   Lupus Sister    Endometriosis Sister    Arthritis Sister    Depression Sister    Uterine cancer Maternal Aunt        50's   Heart attack Maternal Uncle    Early death Maternal Uncle        Murdered   Breast cancer Paternal Aunt        12's   Early death Paternal Aunt    Emphysema Paternal Aunt        Non-smoker/Died of Pneumonia   Lung disease Paternal Aunt        Bronchiecstasis    Lung cancer Paternal Uncle        smoker   Lung cancer Paternal Uncle    Lung cancer Paternal Uncle    Early death Paternal Uncle        Carbon Monoxide Poisoning/Accidental   Parkinson's disease Maternal Grandmother    Alcohol abuse Maternal Grandmother    Colon cancer Maternal Grandfather    Heart disease Paternal Grandmother        Died during angioplasty surgery   Aneurysm Paternal Grandfather    Allergy (severe) Son        ASA-Idiopathic Thrombocytopenia   Pancreatic cancer Neg Hx    Esophageal cancer Neg Hx    Rectal cancer Neg Hx    Stomach cancer Neg Hx     Social History   Socioeconomic History   Marital status: Single    Spouse name: Not on file   Number of children: 2   Years of education: Not on file   Highest education level: Associate degree: occupational, Scientist, product/process development, or vocational program  Occupational History   Not on file  Tobacco Use   Smoking status: Never   Smokeless tobacco: Never  Vaping Use   Vaping status: Never Used  Substance and Sexual Activity   Alcohol use: Yes    Comment: rarely   Drug use: No   Sexual activity: Not on file  Other Topics Concern   Not on file  Social History Narrative   Works at [employer]   Social Drivers of Health   Financial Resource Strain: Medium Risk (12/19/2023)   Overall Financial Resource Strain (CARDIA)    Difficulty of Paying Living Expenses: Somewhat hard  Food Insecurity: Low Risk  (02/20/2024)   Received from [facility]   Hunger Vital Sign    Within the past 12 months, you worried that your food would run out before you got money to buy more:  Never true    Within the past 12 months, the food you bought just didn't last and you didn't have money to get more. : Never true  Recent Concern: Food Insecurity - Food Insecurity Present (12/19/2023)   Hunger Vital Sign    Worried About Running Out of Food in the Last Year: Sometimes true    Ran Out of Food in the Last Year: Never true  Transportation Needs:  Unmet Transportation Needs (02/20/2024)   Received from [facility]    In the past 12 months, has lack of reliable transportation kept you from medical appointments, meetings, work or from getting things needed for daily living? : Yes  Physical Activity: Unknown (06/20/2023)   Exercise Vital Sign    Days of Exercise per Week: 0 days    Minutes of Exercise per Session: Not on file  Stress: No Stress Concern Present (06/20/2023)   [employer] of Occupational Health - Occupational Stress Questionnaire    Feeling of Stress : Only a little  Social Connections: Socially Isolated (12/19/2023)   Social Connection and Isolation Panel    Frequency of Communication with Friends and Family: More than three times a week    Frequency of Social Gatherings with Friends and Family: Twice a week    Attends Religious Services: Never    Database administrator or Organizations: No    Attends Engineer, structural: Not on file    Marital Status: Divorced  Intimate Partner Violence: Unknown (01/26/2022)   Received from [facility]   HITS    Physically Hurt: Not on file    Insult or Talk Down To: Not on file    Threaten Physical Harm: Not on file    Scream or Curse: Not on file    Physical Exam: Vital signs in last 24 hours: @Temp  97.9 F (36.6 C)   LMP 04/03/2012 Comment: Still has Cervix and Ovaries GEN: NAD EYE: Sclerae anicteric ENT: MMM CV: Non-tachycardic Pulm: CTA b/l GI: Soft, NT/ND NEURO:  Alert & Oriented x 3   Gordy Starch, MD [facility]  08/06/2024 2:36 PM

## 2024-08-06 NOTE — Patient Instructions (Signed)
 Handout provided on hiatal hernia and gastritis.  Resume previous diet.  Continue present medications, including Pantoprazole  40mg  twice daily before meals.   YOU HAD AN ENDOSCOPIC PROCEDURE TODAY AT THE [facility]:   Refer to the procedure report that was given to you for any specific questions about what was found during the examination.  If the procedure report does not answer your questions, please call your gastroenterologist to clarify.  If you requested that your care partner not be given the details of your procedure findings, then the procedure report has been included in a sealed envelope for you to review at your convenience later.  YOU SHOULD EXPECT: Some feelings of bloating in the abdomen. Passage of more gas than usual.  Walking can help get rid of the air that was put into your GI tract during the procedure and reduce the bloating. If you had a lower endoscopy (such as a colonoscopy or flexible sigmoidoscopy) you may notice spotting of blood in your stool or on the toilet paper. If you underwent a bowel prep for your procedure, you may not have a normal bowel movement for a few days.  Please Note:  You might notice some irritation and congestion in your nose or some drainage.  This is from the oxygen used during your procedure.  There is no need for concern and it should clear up in a day or so.  SYMPTOMS TO REPORT IMMEDIATELY:  Following upper endoscopy (EGD)  Vomiting of blood or coffee ground material  New chest pain or pain under the shoulder blades  Painful or persistently difficult swallowing  New shortness of breath  Fever of 100F or higher  Black, tarry-looking stools  For urgent or emergent issues, a gastroenterologist can be reached at any hour by calling [phone]. Do not use MyChart messaging for urgent concerns.    DIET:  We do recommend a small meal at first, but then you may proceed to your regular diet.  Drink plenty of fluids but you should  avoid alcoholic beverages for 24 hours.  ACTIVITY:  You should plan to take it easy for the rest of today and you should NOT DRIVE or use heavy machinery until tomorrow (because of the sedation medicines used during the test).    FOLLOW UP: Our staff will call the number listed on your records the next business day following your procedure.  We will call around 7:15- 8:00 am to check on you and address any questions or concerns that you may have regarding the information given to you following your procedure. If we do not reach you, we will leave a message.     If any biopsies were taken you will be contacted by phone or by letter within the next 1-3 weeks.  Please call us  at [phone] if you have not heard about the biopsies in 3 weeks.    SIGNATURES/CONFIDENTIALITY: You and/or your care partner have signed paperwork which will be entered into your electronic medical record.  These signatures attest to the fact that that the information above on your After Visit Summary has been reviewed and is understood.  Full responsibility of the confidentiality of this discharge information lies with you and/or your care-partner.

## 2024-08-07 ENCOUNTER — Telehealth: Payer: Self-pay

## 2024-08-07 NOTE — Telephone Encounter (Signed)
 No answer on follow-up call. Left VM for pt.

## 2024-08-27 NOTE — Progress Notes (Signed)
 GASTROENTEROLOGY INITIAL CONSULTATION VISIT   Referring Provider Almarie Waddell Benders, NP [address] [facility] AT [place],  [place] [address]  Primary Care Provider Almarie Waddell Benders, NP  Patient Profile: Gina Wise is a 63 y.o. female who presents to the [facility] at the request of Dr. Almarie for a consultation for evaluation and management of the problem(s) noted below.  Problem List: Pyloric stenosis (HHS-HCC)  (primary encounter diagnosis) Need for vaccination  History of Present Illness: History of Present Illness Gina Wise is a 63 year old female with recurrent pyloric stenosis who presents for discussion of her condition. She was referred by another doctor for consideration of gastric POEM due to recurrent pyloric stenosis.  She experiences abdominal pain, nausea, vomiting, and regurgitation due to recurrent pyloric stenosis, which initially improve after endoscopic dilation but return within a week. These symptoms significantly impact her quality of life, necessitating dietary caution to prevent food impaction.  Her medical history includes two Nissen fundoplications in the past three years, which she believes may have contributed to her current condition. She also has a history of C. difficile infection, which she feels exacerbated her symptoms.  She maintains a soft food diet and takes protein tonics daily. She avoids NSAIDs and has tried various medications for nausea and stomach pain without relief. Constipation leads to painful diarrhea and low potassium levels. Stimulant laxatives like bisacodyl  and senna cause discomfort.  Her symptoms persist despite multiple interventions, and she seeks relief, expressing significant distress. Her condition has worsened since her last dilation on October 16th, and she is already feeling unwell again.  Review of Systems: The pertinent positives and negatives  as documented in the HPI. All other systems are negative. The Chief Complaint, HPI, ROS, and patient and family history as documented were reviewed by me with the patient and updated as appropriate.  Allergies: Cat dander, Erythromycin, Iodinated contrast media, Metrizamide, Other, Iohexol , and Codeine  Medications:  Current Outpatient Medications:  .  cetirizine (ZYRTEC) 10 MG tablet, Take 10 mg by mouth once daily, Disp: , Rfl:  .  diphenhydrAMINE  (BENADRYL ) 25 mg tablet, Take 25 mg by mouth as needed for Itching or Allergies, Disp: , Rfl:  .  hydroCHLOROthiazide  (HYDRODIURIL ) 25 MG tablet, Take 25 mg by mouth once daily., Disp: , Rfl:  .  levothyroxine  (SYNTHROID , LEVOTHROID) 125 MCG tablet, Take 125 mcg by mouth every evening, Disp: , Rfl:  .  pantoprazole  (PROTONIX ) 40 MG DR tablet, Take 40 mg by mouth 2 (two) times daily before meals, Disp: , Rfl:  .  promethazine  (PHENERGAN ) 12.5 MG tablet, Take 1 tablet (12.5 mg total) by mouth every 6 (six) hours as needed for Nausea, Disp: 20 tablet, Rfl: 0 .  sertraline  (ZOLOFT ) 50 MG tablet, Take 50 mg by mouth once daily., Disp: , Rfl:  .  SUMAtriptan  (IMITREX ) 100 MG tablet, Take 100 mg by mouth once as needed for Migraine May take a second dose after 2 hours if needed., Disp: , Rfl:   Past Medical History:  Diagnosis Date  . Allergic state   . Awareness under anesthesia    Woke up during surgery at [facility] - could hear entire conversation - 07/2018  . Depression   . Esophageal varices (CMS/HHS-HCC)   . GERD (gastroesophageal reflux disease)   . Hyperlipidemia   . Hypertension   . Hypothyroid   . Hypothyroidism (acquired)   . Hypothyroidism (acquired)   .  Motion sickness   . PONV (postoperative nausea and vomiting)    Does well w/ scope patch  . Sjogren's disease (HHS-HCC)     Past Surgical History:  Procedure Laterality Date  . APPENDECTOMY  1986   found scar tissue during ex-lap  . LAPAROSCOPIC TUBAL LIGATION  1994  . left  rotator cuff surgery Left 1995  . shoulder surgery Left 1997   cartilage repair  . INGUINAL HERNIA REPAIR  2004  . HYSTERECTOMY  2014   partial  . RADIOACTIVE SEED GUIDED EXCISIONAL BREAST BIOPSY Left 02/25/2018   complex sclerosing lesion  . ROBOTIC SURGICAL SYSTEM  LAPAROSCOPY, SURG, COLPOPEXY  POST COLPORRHAPHY,RECTUM/VAGINA  SACRAL COLPOPEXY ROBOTIC  POSTERIOR COLPORRHAPHY   08/04/2018  . Robotic hernia repair w/ Toupet fundoplication  05/03/2022  . COLONOSCOPY  01/01/2023  . EGD  01/01/2023  . ROBOT ASSISTED LAPAROSCOPIC REPAIR OF PARAESOPHAGEAL HERNIA, INCLUDES FUNDOPLASTY, WHEN PERFORMED; WITH IMPLANTATION OF MESH N/A 02/27/2023   Procedure: **ROBOT ASSISTED** LAPAROSCOPY, SURGICAL; REPAIR OF PARAESOPHAGEAL HERNIA, INCLUDES FUNDOPLASTY, WHEN PERFORMED; WITH IMPLANTATION OF MESH;  Surgeon: Elmo Donnice Pinch, MD;  Location: [facility]PERATING [facility]OMS;  Service: Cardiothoracic;  Laterality: N/A;  . ESOPHAGOSCOPY N/A 02/27/2023   Procedure: ESOPHAGOSCOPY, FLEXIBLE, TRANSORAL; DIAGNOSTIC, INCLUDING COLLECTION OF SPECIMEN(S) BY BRUSHING OR WASHING, WHEN PERFORMED (SEPARATE PROCEDURE);  Surgeon: Elmo Donnice Pinch, MD;  Location: [facility];  Service: Cardiothoracic;  Laterality: N/A;  . TONSILLECTOMY & ADENOIDECTOMY      Social History   Social History Narrative   Single, 2 children, works in holiday representative, lives in a house, two pet dogs and a cockatiel bird as pets   Luvenia reports that she has never smoked. She has never used smokeless tobacco. She reports that she does not currently use alcohol. She reports that she does not use drugs.  Family History  Problem Relation Name Age of Onset  . Obesity Mother    . High blood pressure (Hypertension) Mother    . Hyperlipidemia (Elevated cholesterol) Mother    . Pulmonary embolism Mother    . Lupus Mother    . High blood pressure (Hypertension) Father    . Hyperlipidemia (Elevated cholesterol) Father    . Coronary Artery  Disease (Blocked arteries around heart) Father    . Emphysema Father    . Obesity Sister    . High blood pressure (Hypertension) Sister    . Hyperlipidemia (Elevated cholesterol) Sister    . Coronary Artery Disease (Blocked arteries around heart) Sister    . Lupus Sister    . Rheum arthritis Paternal Grandmother    . Anesthesia problems Neg Hx      PHYSICAL EXAMINATION  BP 137/81   Pulse 77   Temp 36.1 C (97 F)   Ht 167.6 cm (5' 6)   Wt 80.2 kg (176 lb 12.8 oz)   SpO2 97%   BMI 28.54 kg/m  Physical Exam HENT:     Head: Normocephalic and atraumatic.  Eyes:     General: No scleral icterus. Cardiovascular:     Rate and Rhythm: Normal rate and regular rhythm.  Pulmonary:     Effort: Pulmonary effort is normal. No respiratory distress.     Breath sounds: No wheezing.  Abdominal:     General: There is no distension.  Musculoskeletal:     Cervical back: Normal range of motion.  Skin:    General: Skin is warm and dry.     Findings: No erythema or rash.  Neurological:  Mental Status: She is alert and oriented to person, place, and time.  Psychiatric:        Mood and Affect: Affect normal.        Judgment: Judgment normal.     MEDICAL DECISION MAKING  I reviewed the following data at the time of this encounter:  Referring provider's note reviewed from date of referral.  Laboratory Studies   Lab Results  Component Value Date   WBC 14.9 (H) 08/18/2023   HGB 10.9 (L) 08/18/2023   HCT 34.3 (L) 08/18/2023   MCV 90 08/18/2023   PLT 220 08/18/2023   Lab Results  Component Value Date   GLUCOSE 122 08/18/2023   BUN 6 (L) 08/18/2023   CREATININE 0.6 08/18/2023   NA 137 08/18/2023   K 3.7 08/18/2023   CL 109 (H) 08/18/2023   CO2 20 (L) 08/18/2023   ALB 2.9 (L) 08/18/2023   TBILI 0.9 08/18/2023   AST 15 08/18/2023   ALKPHOS 53 08/18/2023   CALCIUM  7.7 (L) 08/18/2023   ALT 13 08/18/2023     Results DIAGNOSTIC Sigmoidoscopy: Rectal ulceration and  suspected corral ulcer (01/2024) Upper endoscopy: Narrowing at the pyloric channel, oozing of the mucosa, dilation to 10 mm (02/13/2024) Upper endoscopy: Narrowing at the gastroesophageal junction, dilation to 18 mm, mucosal disruption at the lower esophageal sphincter, grade A esophagitis, two gastric erosions, pylorus narrowed and dilated to 15 mm (02/20/2024) Upper endoscopy: Normal esophagus, widely patent Schatzky's ring, small sliding hiatal hernia, normal stomach, narrowed pylorus, endoscope passed with slight resistance, dilation to 15 mm, shallow mucosal tear (04/06/2024) Upper endoscopy: Nissen fundoplication intact, benign intrinsic moderate stenosis of the pylorus, dilation to 15 mm, moderate mucosal disruption (05/13/2024) Upper endoscopy: Benign intrinsic moderate stenosis of the pylorus, dilation to 16.5 mm, moderate mucosal disruption, Nissen fundoplication seen (06/05/2024) Upper endoscopy: Pyloric stenosis, dilation to 15.5 mm, mild mucosal disruption (07/02/2024) Upper endoscopy: Benign intrinsic moderate stenosis of the pylorus, dilation to 18 mm, moderate mucosal disruption (08/06/2024)  PATHOLOGY Biopsy: Acute gastritis, multifocal intestinal metaplasia, negative for H. pylori (02/20/2024)  ASSESSMENT  Ms. Mcelveen is a 63 y.o. female who presents today for an initial consultation for evaluation and management of:  1. Pyloric stenosis (HHS-HCC)   2. Need for vaccination     Assessment & Plan Benign intrinsic moderate pyloric stenosis with chronic upper gastrointestinal symptoms   Chronic pyloric stenosis presents with recurrent abdominal pain, nausea, vomiting, and regurgitation. Symptoms temporarily improved with dilations but recurred. Previous endoscopies showed moderate stenosis with mucosal disruption. Gastric POEM is considered due to recurrent stenosis. Schedule gastric POEM procedure and provide pre-procedure and post-procedure instructions.  The details of gastric  POEM were discussed with the patient, including a general description of how the procedure is performed and the possible need for a short stay in the hospital afterwards.  I carefully discussed the risks of the procedure, including pain, perforation, delayed perforation, infection, bleeding, severe illness requiring prolonged hospitalization and possible major surgery, as well as anesthesia and sedation related illnesses.  I discussed the long-term risk of dumping syndrome and its need for management and she is agreeable to proceed.       PLAN   Orders Placed This Encounter  Procedures  . Case Request GI: Gastric Peroral Endoscopic Myotomy  . Flu Vaccine IIV3, IM PF (11MO+)(Fluarix, Flulaval, Fluzone)    New Prescriptions   No medications on file    The risks and benefits of my recommendations, as well as other treatment options  were discussed with the patient. Questions were answered.  Planned Follow Up: 1 week post POEM  Coding: Billing for this encounter was based on medical decision making.    Attestation Statement:   I personally performed the service. (TP)  DARIN LYN DUFAULT, MD     DARIN LYN DUFAULT, MD [facility]   AI Note Feedback-- Point of Care Survey Quick Comment Survey This note has been created using automated tools and reviewed for accuracy by DARIN LYN DUFAULT.

## 2024-08-31 ENCOUNTER — Inpatient Hospital Stay (HOSPITAL_COMMUNITY)
Admission: EM | Admit: 2024-08-31 | Discharge: 2024-09-04 | DRG: 382 | Disposition: A | Discharge status: Home / Self Care | Attending: Internal Medicine | Admitting: Internal Medicine

## 2024-08-31 ENCOUNTER — Emergency Department (HOSPITAL_COMMUNITY)

## 2024-08-31 ENCOUNTER — Encounter (HOSPITAL_COMMUNITY): Payer: Self-pay

## 2024-08-31 ENCOUNTER — Other Ambulatory Visit: Payer: Self-pay

## 2024-08-31 ENCOUNTER — Encounter: Payer: Self-pay | Admitting: Internal Medicine

## 2024-08-31 DIAGNOSIS — E7800 Pure hypercholesterolemia, unspecified: Secondary | ICD-10-CM | POA: Diagnosis present

## 2024-08-31 DIAGNOSIS — I10 Essential (primary) hypertension: Secondary | ICD-10-CM | POA: Diagnosis present

## 2024-08-31 DIAGNOSIS — Z9889 Other specified postprocedural states: Secondary | ICD-10-CM | POA: Diagnosis not present

## 2024-08-31 DIAGNOSIS — Z8049 Family history of malignant neoplasm of other genital organs: Secondary | ICD-10-CM

## 2024-08-31 DIAGNOSIS — K219 Gastro-esophageal reflux disease without esophagitis: Secondary | ICD-10-CM | POA: Diagnosis present

## 2024-08-31 DIAGNOSIS — K2970 Gastritis, unspecified, without bleeding: Secondary | ICD-10-CM | POA: Diagnosis present

## 2024-08-31 DIAGNOSIS — K449 Diaphragmatic hernia without obstruction or gangrene: Secondary | ICD-10-CM | POA: Diagnosis present

## 2024-08-31 DIAGNOSIS — F419 Anxiety disorder, unspecified: Secondary | ICD-10-CM | POA: Diagnosis present

## 2024-08-31 DIAGNOSIS — R1310 Dysphagia, unspecified: Secondary | ICD-10-CM | POA: Diagnosis present

## 2024-08-31 DIAGNOSIS — K5900 Constipation, unspecified: Secondary | ICD-10-CM | POA: Diagnosis present

## 2024-08-31 DIAGNOSIS — Z79890 Hormone replacement therapy (postmenopausal): Secondary | ICD-10-CM

## 2024-08-31 DIAGNOSIS — M3500 Sicca syndrome, unspecified: Secondary | ICD-10-CM | POA: Diagnosis present

## 2024-08-31 DIAGNOSIS — M329 Systemic lupus erythematosus, unspecified: Secondary | ICD-10-CM | POA: Diagnosis present

## 2024-08-31 DIAGNOSIS — Z888 Allergy status to other drugs, medicaments and biological substances status: Secondary | ICD-10-CM | POA: Diagnosis not present

## 2024-08-31 DIAGNOSIS — F32A Depression, unspecified: Secondary | ICD-10-CM | POA: Diagnosis present

## 2024-08-31 DIAGNOSIS — E860 Dehydration: Secondary | ICD-10-CM | POA: Diagnosis present

## 2024-08-31 DIAGNOSIS — Z825 Family history of asthma and other chronic lower respiratory diseases: Secondary | ICD-10-CM | POA: Diagnosis not present

## 2024-08-31 DIAGNOSIS — Z881 Allergy status to other antibiotic agents status: Secondary | ICD-10-CM

## 2024-08-31 DIAGNOSIS — K529 Noninfective gastroenteritis and colitis, unspecified: Secondary | ICD-10-CM | POA: Diagnosis present

## 2024-08-31 DIAGNOSIS — R112 Nausea with vomiting, unspecified: Secondary | ICD-10-CM | POA: Diagnosis not present

## 2024-08-31 DIAGNOSIS — Z811 Family history of alcohol abuse and dependence: Secondary | ICD-10-CM

## 2024-08-31 DIAGNOSIS — Z822 Family history of deafness and hearing loss: Secondary | ICD-10-CM

## 2024-08-31 DIAGNOSIS — Z800 Family history of malignant neoplasm of digestive organs: Secondary | ICD-10-CM

## 2024-08-31 DIAGNOSIS — Z8261 Family history of arthritis: Secondary | ICD-10-CM

## 2024-08-31 DIAGNOSIS — K3189 Other diseases of stomach and duodenum: Secondary | ICD-10-CM | POA: Diagnosis present

## 2024-08-31 DIAGNOSIS — Z8269 Family history of other diseases of the musculoskeletal system and connective tissue: Secondary | ICD-10-CM

## 2024-08-31 DIAGNOSIS — Z820 Family history of epilepsy and other diseases of the nervous system: Secondary | ICD-10-CM

## 2024-08-31 DIAGNOSIS — Z8249 Family history of ischemic heart disease and other diseases of the circulatory system: Secondary | ICD-10-CM | POA: Diagnosis not present

## 2024-08-31 DIAGNOSIS — Z818 Family history of other mental and behavioral disorders: Secondary | ICD-10-CM

## 2024-08-31 DIAGNOSIS — E039 Hypothyroidism, unspecified: Secondary | ICD-10-CM | POA: Diagnosis present

## 2024-08-31 DIAGNOSIS — Z91041 Radiographic dye allergy status: Secondary | ICD-10-CM

## 2024-08-31 DIAGNOSIS — R110 Nausea: Secondary | ICD-10-CM | POA: Diagnosis not present

## 2024-08-31 DIAGNOSIS — Z821 Family history of blindness and visual loss: Secondary | ICD-10-CM

## 2024-08-31 DIAGNOSIS — R109 Unspecified abdominal pain: Secondary | ICD-10-CM | POA: Diagnosis not present

## 2024-08-31 DIAGNOSIS — E876 Hypokalemia: Secondary | ICD-10-CM | POA: Diagnosis present

## 2024-08-31 DIAGNOSIS — Z90710 Acquired absence of both cervix and uterus: Secondary | ICD-10-CM

## 2024-08-31 DIAGNOSIS — K2990 Gastroduodenitis, unspecified, without bleeding: Secondary | ICD-10-CM

## 2024-08-31 DIAGNOSIS — Z59868 Other specified financial insecurity: Secondary | ICD-10-CM

## 2024-08-31 DIAGNOSIS — A09 Infectious gastroenteritis and colitis, unspecified: Secondary | ICD-10-CM | POA: Diagnosis present

## 2024-08-31 DIAGNOSIS — Z801 Family history of malignant neoplasm of trachea, bronchus and lung: Secondary | ICD-10-CM

## 2024-08-31 DIAGNOSIS — K311 Adult hypertrophic pyloric stenosis: Principal | ICD-10-CM | POA: Diagnosis present

## 2024-08-31 DIAGNOSIS — Z803 Family history of malignant neoplasm of breast: Secondary | ICD-10-CM

## 2024-08-31 DIAGNOSIS — Z79899 Other long term (current) drug therapy: Secondary | ICD-10-CM | POA: Diagnosis not present

## 2024-08-31 DIAGNOSIS — R7303 Prediabetes: Secondary | ICD-10-CM | POA: Diagnosis present

## 2024-08-31 DIAGNOSIS — E669 Obesity, unspecified: Secondary | ICD-10-CM | POA: Diagnosis present

## 2024-08-31 DIAGNOSIS — Z604 Social exclusion and rejection: Secondary | ICD-10-CM | POA: Diagnosis present

## 2024-08-31 LAB — CBC WITH DIFFERENTIAL/PLATELET
Abs Immature Granulocytes: 0.05 K/uL (ref 0.00–0.07)
Basophils Absolute: 0.1 K/uL (ref 0.0–0.1)
Basophils Relative: 0 %
Eosinophils Absolute: 0.2 K/uL (ref 0.0–0.5)
Eosinophils Relative: 2 %
HCT: 45.3 % (ref 36.0–46.0)
Hemoglobin: 15.1 g/dL — ABNORMAL HIGH (ref 12.0–15.0)
Immature Granulocytes: 0 %
Lymphocytes Relative: 20 %
Lymphs Abs: 2.7 K/uL (ref 0.7–4.0)
MCH: 29.1 pg (ref 26.0–34.0)
MCHC: 33.3 g/dL (ref 30.0–36.0)
MCV: 87.3 fL (ref 80.0–100.0)
Monocytes Absolute: 1 K/uL (ref 0.1–1.0)
Monocytes Relative: 7 %
Neutro Abs: 9.4 K/uL — ABNORMAL HIGH (ref 1.7–7.7)
Neutrophils Relative %: 71 %
Platelets: 411 K/uL — ABNORMAL HIGH (ref 150–400)
RBC: 5.19 MIL/uL — ABNORMAL HIGH (ref 3.87–5.11)
RDW: 13.3 % (ref 11.5–15.5)
WBC: 13.4 K/uL — ABNORMAL HIGH (ref 4.0–10.5)
nRBC: 0 % (ref 0.0–0.2)

## 2024-08-31 LAB — CBC
HCT: 41.6 % (ref 36.0–46.0)
Hemoglobin: 13.7 g/dL (ref 12.0–15.0)
MCH: 29.5 pg (ref 26.0–34.0)
MCHC: 32.9 g/dL (ref 30.0–36.0)
MCV: 89.7 fL (ref 80.0–100.0)
Platelets: 338 K/uL (ref 150–400)
RBC: 4.64 MIL/uL (ref 3.87–5.11)
RDW: 13.4 % (ref 11.5–15.5)
WBC: 10.5 K/uL (ref 4.0–10.5)
nRBC: 0 % (ref 0.0–0.2)

## 2024-08-31 LAB — CREATININE, SERUM
Creatinine, Ser: 0.84 mg/dL (ref 0.44–1.00)
GFR, Estimated: >60 mL/min (ref >60)

## 2024-08-31 LAB — COMPREHENSIVE METABOLIC PANEL WITH GFR
ALT: 7 U/L (ref 0–44)
AST: 21 U/L (ref 15–41)
Albumin: 4.8 g/dL (ref 3.5–5.0)
Alkaline Phosphatase: 72 U/L (ref 38–126)
Anion gap: 17 — ABNORMAL HIGH (ref 5–15)
BUN: 21 mg/dL (ref 8–23)
CO2: 21 mmol/L — ABNORMAL LOW (ref 22–32)
Calcium: 10 mg/dL (ref 8.9–10.3)
Chloride: 99 mmol/L (ref 98–111)
Creatinine, Ser: 0.9 mg/dL (ref 0.44–1.00)
GFR, Estimated: >60 mL/min (ref >60)
Glucose, Bld: 106 mg/dL — ABNORMAL HIGH (ref 70–99)
Potassium: 3.6 mmol/L (ref 3.5–5.1)
Sodium: 137 mmol/L (ref 135–145)
Total Bilirubin: 0.6 mg/dL (ref 0.0–1.2)
Total Protein: 8.6 g/dL — ABNORMAL HIGH (ref 6.5–8.1)

## 2024-08-31 LAB — I-STAT CG4 LACTIC ACID, ED: Lactic Acid, Venous: 1.1 mmol/L (ref 0.5–1.9)

## 2024-08-31 LAB — URINALYSIS, ROUTINE W REFLEX MICROSCOPIC
Bacteria, UA: NONE SEEN
Bilirubin Urine: NEGATIVE
Glucose, UA: NEGATIVE mg/dL
Hgb urine dipstick: NEGATIVE
Ketones, ur: 5 mg/dL — AB
Leukocytes,Ua: NEGATIVE
Nitrite: NEGATIVE
Protein, ur: NEGATIVE mg/dL
Specific Gravity, Urine: 1.023 (ref 1.005–1.030)
pH: 7 (ref 5.0–8.0)

## 2024-08-31 LAB — LIPASE, BLOOD: Lipase: 39 U/L (ref 11–51)

## 2024-08-31 MED ORDER — LACTATED RINGERS IV SOLN: INTRAVENOUS | Status: AC

## 2024-08-31 MED ORDER — MORPHINE SULFATE (PF) 4 MG/ML IV SOLN
4.0000 mg | Freq: Once | INTRAVENOUS | Status: AC
  Administered 2024-08-31: 4 mg via INTRAVENOUS
  Filled 2024-08-31: qty 1

## 2024-08-31 MED ORDER — ONDANSETRON HCL 4 MG/2ML IJ SOLN
4.0000 mg | Freq: Once | INTRAMUSCULAR | Status: AC
  Administered 2024-08-31: 4 mg via INTRAVENOUS
  Filled 2024-08-31: qty 2

## 2024-08-31 MED ORDER — ONDANSETRON HCL 4 MG/2ML IJ SOLN
4.0000 mg | Freq: Four times a day (QID) | INTRAMUSCULAR | Status: DC | PRN
  Administered 2024-08-31 – 2024-09-04 (×12): 4 mg via INTRAVENOUS
  Filled 2024-08-31 (×12): qty 2

## 2024-08-31 MED ORDER — PIPERACILLIN-TAZOBACTAM 3.375 G IVPB 30 MIN
3.3750 g | Freq: Once | INTRAVENOUS | Status: AC
  Administered 2024-08-31: 3.375 g via INTRAVENOUS
  Filled 2024-08-31: qty 50

## 2024-08-31 MED ORDER — SODIUM CHLORIDE 0.9 % IV BOLUS
1000.0000 mL | Freq: Once | INTRAVENOUS | Status: AC
  Administered 2024-08-31: 1000 mL via INTRAVENOUS

## 2024-08-31 MED ORDER — ENOXAPARIN SODIUM 40 MG/0.4ML IJ SOSY
40.0000 mg | PREFILLED_SYRINGE | INTRAMUSCULAR | Status: DC
  Administered 2024-08-31 – 2024-09-03 (×4): 40 mg via SUBCUTANEOUS
  Filled 2024-08-31 (×4): qty 0.4

## 2024-08-31 MED ORDER — AMOXICILLIN-POT CLAVULANATE 875-125 MG PO TABS
1.0000 | ORAL_TABLET | Freq: Once | ORAL | Status: DC
  Filled 2024-08-31: qty 1

## 2024-08-31 MED ORDER — MORPHINE SULFATE (PF) 4 MG/ML IV SOLN
4.0000 mg | INTRAVENOUS | Status: DC | PRN
  Administered 2024-08-31 – 2024-09-02 (×11): 4 mg via INTRAVENOUS
  Filled 2024-08-31 (×11): qty 1

## 2024-08-31 MED ORDER — PANTOPRAZOLE SODIUM 40 MG IV SOLR
40.0000 mg | Freq: Two times a day (BID) | INTRAVENOUS | Status: DC
  Administered 2024-08-31 – 2024-09-04 (×8): 40 mg via INTRAVENOUS
  Filled 2024-08-31 (×8): qty 10

## 2024-08-31 MED ORDER — SODIUM CHLORIDE 0.9 % IV SOLN
12.5000 mg | Freq: Once | INTRAVENOUS | Status: AC
  Administered 2024-08-31: 12.5 mg via INTRAVENOUS
  Filled 2024-08-31: qty 12.5

## 2024-08-31 MED ORDER — ONDANSETRON HCL 4 MG PO TABS
4.0000 mg | ORAL_TABLET | Freq: Four times a day (QID) | ORAL | Status: DC | PRN
Start: 2024-08-31 — End: 2024-09-03

## 2024-08-31 MED ORDER — PIPERACILLIN-TAZOBACTAM 3.375 G IVPB
3.3750 g | Freq: Three times a day (TID) | INTRAVENOUS | Status: DC
  Administered 2024-09-01 – 2024-09-03 (×9): 3.375 g via INTRAVENOUS
  Filled 2024-08-31 (×9): qty 50

## 2024-08-31 NOTE — H&P (Addendum)
 History and Physical    Patient: Gina Wise FMW:[id number] DOB: October 25, 1960 DOA: 08/31/2024 DOS: the patient was seen and examined on 08/31/2024 PCP: Almarie Waddell NOVAK, NP  Patient coming from: Home  Chief Complaint:  Chief Complaint  Patient presents with   Abdominal Pain   HPI: Gina Wise is a 63 y.o. female with medical history significant of recurrent pyloric stenosis requiring monthly dilation pending gastric POEM procedure, Sjogren's syndrome,  hypothyroidism, HTN,  presents to Emergency Department for evaluation of intermittent, cramping LLQ abdominal pain, constipation, nausea and vomiting for almost 2 weeks. Symptoms started with non-bloody diarrhea on 10/29, however that was her last BM.  She tried taking milk of mag and enemas given at home without relief. She denies flatus as well. On 10/8, she developed epigastric abdominal pain and has been unable to tolerate PO intake since then. She denies fevers, cough, hematemesis.  She has had similar episodes in the past with CT scans showing coltis.  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 Her last esophageal dilation was 08/06/2024.  In the emergency department, she was hemodynamically stable.  Slight leukocytosis of 13.4.  Hemoglobin 15.  Lactic acid was reassuring.  CT abdomen pelvis revealing nonspecific sigmoid colitis.  No evidence of bowel obstruction.  ED provider discussed with on-call gastroenterology, recommended admit here for GI consult tomorrow.   Review of Systems: Review of Systems  Constitutional:  Negative for chills, fever, malaise/fatigue and weight loss.  Eyes:  Negative for blurred vision, double vision and discharge.  Respiratory:  Negative for cough, hemoptysis, sputum production and shortness of breath.   Cardiovascular:  Negative for chest pain and leg swelling.  Gastrointestinal:  Positive for abdominal pain, constipation, diarrhea, heartburn, nausea and vomiting.  Negative for blood in stool and melena.  Genitourinary:  Negative for dysuria and frequency.  Musculoskeletal:  Negative for back pain, joint pain and myalgias.  Skin:  Negative for itching and rash.  Neurological:  Negative for dizziness, tremors, sensory change, speech change, focal weakness and headaches.  Psychiatric/Behavioral:  Positive for depression. Negative for substance abuse and suicidal ideas. The patient is not nervous/anxious.     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   Allergy    Anemia    Anxiety    Aortic atherosclerosis    Bruising 05/10/2015   CAP (community acquired pneumonia) 08/03/2015   Chronic bronchitis (HCC)    get it q yr; in the spring time (02/17/2014)   COVID-19    Depression    have taken zoloft  since 1996 (02/17/2014)   Diverticulosis    Duodenal diverticulum    Dyspnea    from scar tissue   Epistaxis 12/01/2015   Gastritis    GERD (gastroesophageal reflux disease)    Hematemesis with nausea 08/01/2016   Hiatal hernia    High cholesterol    Hypertension    Hypothyroid    Internal hemorrhoids    Migraines    maybe a couple/yr (02/17/2014)   Multiple environmental allergies    Overweight(278.02)    Peri-menopause    PONV (postoperative nausea and vomiting)    Pre-diabetes    Schatzki's ring    Sjogren's disease    Systemic lupus (HCC)    Past Surgical History:  Procedure Laterality Date   APPENDECTOMY  1987   BREAST BIOPSY Left 01/30/2018   BREAST EXCISIONAL BIOPSY Left 02/25/2018   ESOPHAGEAL MANOMETRY N/A 05/02/2022   Procedure: ESOPHAGEAL MANOMETRY (  EM);  Surgeon: Albertus Gordy HERO, MD;  Location: [facility];  Service: Gastroenterology;  Laterality: N/A;   ESOPHAGOGASTRODUODENOSCOPY N/A 02/28/2014   Procedure: ESOPHAGOGASTRODUODENOSCOPY (EGD);  Surgeon: Gordy HERO Albertus, MD;  Location: [facility] END[facility]COPY;  Service: Endoscopy;  Laterality: N/A;   ESOPHAGOGASTRODUODENOSCOPY N/A 08/02/2016   Procedure:  ESOPHAGOGASTRODUODENOSCOPY (EGD);  Surgeon: Lupita FORBES Commander, MD;  Location: [facility] ENDOSCOPY;  Service: Endoscopy;  Laterality: N/A;   EXPLORATORY LAPAROTOMY  1987   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   FOOT SURGERY Right ~1980   don't know what they did; had to take something out   HERNIA REPAIR     INGUINAL HERNIA REPAIR Right ~ 2003   RADIOACTIVE SEED GUIDED EXCISIONAL BREAST BIOPSY Left 02/25/2018   Procedure: RADIOACTIVE SEED GUIDED EXCISIONAL BREAST BIOPSY;  Surgeon: Ebbie Cough, MD;  Location: [facility];  Service: General;  Laterality: Left;   SHOULDER OPEN ROTATOR CUFF REPAIR Left 1995   SHOULDER SURGERY Left 1997   cartilage   TONSILLECTOMY AND ADENOIDECTOMY  1976   TUBAL LIGATION  1998   laparoscopic   VAGINAL HYSTERECTOMY  04/08/2012   menorrhagia   VAGINAL PROLAPSE REPAIR  08/04/2018   Anterior and posterior with colp-mesh-Dr. Comer Ku @ [facility]   XI ROBOTIC ASSISTED HIATAL HERNIA REPAIR N/A 05/21/2022   Procedure: XI ROBOTIC ASSISTED HIATAL HERNIA REPAIR WITH TOUPET FUNDOPLICATION;  Surgeon: Signe Mitzie LABOR, MD;  Location: [facility];  Service: General;  Laterality: N/A;   Social History:  reports that she has never smoked. She has never used smokeless tobacco. She reports current alcohol use. She reports that she does not use drugs.  Allergies  Allergen Reactions   Contrast Media [Iodinated Contrast Media] Hives and Itching   Other Hives and Swelling    CATS AND CAT DANDER   Codeine Nausea Only   Erythromycin Nausea And Vomiting    Severe abdominal pain   Iohexol  Hives    Family History  Problem Relation Age of Onset   Hypertension Mother    Heart disease Mother    Hepatitis Mother        Hep C   Endometriosis Mother    Pancreatic disease Mother    Depression Mother    Hearing loss Mother    Hypertension Father    Vision loss Father    Alcohol abuse Father    Emphysema Father        Smoker   Hearing loss Father     Early death Father        Murdered   Lupus Sister    Endometriosis Sister    Arthritis Sister    Depression Sister    Uterine cancer Maternal Aunt        50's   Heart attack Maternal Uncle    Early death Maternal Uncle        Murdered   Breast cancer Paternal Aunt        72's   Early death Paternal Aunt    Emphysema Paternal Aunt        Non-smoker/Died of Pneumonia   Lung disease Paternal Aunt        Bronchiecstasis   Lung cancer Paternal Uncle        smoker   Lung cancer Paternal Uncle    Lung cancer Paternal Uncle    Early death Paternal Uncle        Carbon Monoxide Poisoning/Accidental   Parkinson's disease Maternal Grandmother    Alcohol abuse Maternal Grandmother  Colon cancer Maternal Grandfather    Heart disease Paternal Grandmother        Died during angioplasty surgery   Aneurysm Paternal Grandfather    Allergy (severe) Son        ASA-Idiopathic Thrombocytopenia   Pancreatic cancer Neg Hx    Esophageal cancer Neg Hx    Rectal cancer Neg Hx    Stomach cancer Neg Hx     Prior to Admission medications   Medication Sig Start Date End Date Taking? Authorizing Provider  acetaminophen  (TYLENOL ) 500 MG tablet Take 1,000 mg by mouth as needed. 12/26/16   [provider]  carboxymethylcellulose (REFRESH PLUS) 0.5 % SOLN Place 1 drop into both eyes 3 (three) times daily as needed (dry/irritated eyes).    [provider]  cetirizine (ZYRTEC) 10 MG tablet Take 10 mg by mouth daily.    [provider]  dicyclomine  (BENTYL ) 20 MG tablet Take 1 tablet (20 mg total) by mouth 3 (three) times daily as needed for spasms. 06/18/24   Pyrtle, Gordy HERO, MD  diphenhydrAMINE  (BENADRYL ) 25 MG tablet Take 25 mg by mouth every 6 (six) hours as needed for allergies.    [provider]  hydrochlorothiazide  (HYDRODIURIL ) 25 MG tablet TAKE 1 TABLET(25 MG) BY MOUTH DAILY 05/11/24   Almarie Waddell NOVAK, NP  levothyroxine  (SYNTHROID ) 150 MCG tablet Take 1 tablet (150 mcg  total) by mouth daily. 08/13/23   Almarie Waddell NOVAK, NP  ondansetron  (ZOFRAN -ODT) 4 MG disintegrating tablet Take 1 tablet (4 mg total) by mouth every 8 (eight) hours as needed for nausea or vomiting. 02/27/24   Almarie Waddell NOVAK, NP  pantoprazole  (PROTONIX ) 40 MG tablet Take 1 tablet (40 mg total) by mouth 2 (two) times daily. Patient taking differently: Take 40 mg by mouth 2 (two) times daily before a meal. 05/13/24   Charlanne Groom, MD  promethazine  (PHENERGAN ) 12.5 MG tablet Take 1 tablet (12.5 mg total) by mouth every 8 (eight) hours as needed for nausea or vomiting. 06/18/24   Pyrtle, Gordy HERO, MD  sertraline  (ZOLOFT ) 100 MG tablet TAKE ONE-HALF TABLET(50 MG) BY MOUTH EVERY DAY 05/11/24   Almarie Waddell B, NP  SUMAtriptan  (IMITREX ) 100 MG tablet May repeat in 2 hours if headache persists(Plz sched with new provider) 01/10/24   Almarie Waddell NOVAK, NP    Physical Exam: Vitals:   08/31/24 1335 08/31/24 1640  BP: (!) 145/87 138/65  Pulse: (!) 108 77  Resp: 18 15  Temp: 98.7 F (37.1 C) 98.2 F (36.8 C)  TempSrc: Oral Oral  SpO2: 99% 99%   Physical Exam Vitals reviewed.  Constitutional:      General: She is not in acute distress.    Appearance: She is not toxic-appearing.  HENT:     Head: Normocephalic and atraumatic.  Cardiovascular:     Rate and Rhythm: Normal rate and regular rhythm.  Pulmonary:     Effort: Pulmonary effort is normal. No respiratory distress.  Abdominal:     General: Bowel sounds are normal. There is no distension.     Tenderness: There is abdominal tenderness in the epigastric area and left lower quadrant. There is no guarding or rebound.  Skin:    Capillary Refill: Capillary refill takes less than 2 seconds.  Neurological:     General: No focal deficit present.     Mental Status: She is alert and oriented to person, place, and time.  Psychiatric:        Mood and Affect: Mood normal.  Behavior: Behavior normal.     Data Reviewed:   Labs on Admission: I have  personally reviewed following labs and imaging studies  CBC: Recent Labs  Lab 08/31/24 1344  WBC 13.4*  NEUTROABS 9.4*  HGB 15.1*  HCT 45.3  MCV 87.3  PLT 411*   Basic Metabolic Panel: Recent Labs  Lab 08/31/24 1344  NA 137  K 3.6  CL 99  CO2 21*  GLUCOSE 106*  BUN 21  CREATININE 0.90  CALCIUM  10.0   GFR: CrCl cannot be calculated (Unknown ideal weight.). Liver Function Tests: Recent Labs  Lab 08/31/24 1344  AST 21  ALT 7  ALKPHOS 72  BILITOT 0.6  PROT 8.6*  ALBUMIN  4.8   Recent Labs  Lab 08/31/24 1344  LIPASE 39   No results for input(s): AMMONIA in the last 168 hours. Coagulation Profile: No results for input(s): INR, PROTIME in the last 168 hours. Cardiac Enzymes: No results for input(s): CKTOTAL, CKMB, CKMBINDEX, TROPONINI in the last 168 hours. BNP (last 3 results) No results for input(s): PROBNP in the last 8760 hours. HbA1C: No results for input(s): HGBA1C in the last 72 hours. CBG: No results for input(s): GLUCAP in the last 168 hours. Lipid Profile: No results for input(s): CHOL, HDL, LDLCALC, TRIG, CHOLHDL, LDLDIRECT in the last 72 hours. Thyroid  Function Tests: No results for input(s): TSH, T4TOTAL, FREET4, T3FREE, THYROIDAB in the last 72 hours. Anemia Panel: No results for input(s): VITAMINB12, FOLATE, FERRITIN, TIBC, IRON, RETICCTPCT in the last 72 hours. Urine analysis:    Component Value Date/Time   COLORURINE YELLOW 08/31/2024 1351   APPEARANCEUR CLEAR 08/31/2024 1351   LABSPEC 1.023 08/31/2024 1351   PHURINE 7.0 08/31/2024 1351   GLUCOSEU NEGATIVE 08/31/2024 1351   HGBUR NEGATIVE 08/31/2024 1351   HGBUR small 03/23/2010 1451   BILIRUBINUR NEGATIVE 08/31/2024 1351   BILIRUBINUR SMALL 02/16/2014 1055   KETONESUR 5 (A) 08/31/2024 1351   PROTEINUR NEGATIVE 08/31/2024 1351   UROBILINOGEN 0.2 02/27/2014 1306   NITRITE NEGATIVE 08/31/2024 1351   LEUKOCYTESUR NEGATIVE  08/31/2024 1351    Radiological Exams on Admission: CT ABDOMEN PELVIS WO CONTRAST Result Date: 08/31/2024 EXAM: CT ABDOMEN AND PELVIS WITHOUT CONTRAST 08/31/2024 04:33:48 PM TECHNIQUE: CT of the abdomen and pelvis was performed without the administration of intravenous contrast. Multiplanar reformatted images are provided for review. Automated exposure control, iterative reconstruction, and/or weight-based adjustment of the mA/kV was utilized to reduce the radiation dose to as low as reasonably achievable. COMPARISON: 01/31/2024 CLINICAL HISTORY: Abdominal pain, acute, nonlocalized; Bowel obstruction suspected. FINDINGS: LOWER CHEST: No acute abnormality. LIVER: The liver is unremarkable. GALLBLADDER AND BILE DUCTS: Gallbladder is unremarkable. No biliary ductal dilatation. SPLEEN: No acute abnormality. PANCREAS: No acute abnormality. ADRENAL GLANDS: No acute abnormality. KIDNEYS, URETERS AND BLADDER: No stones in the kidneys or ureters. No hydronephrosis. No perinephric or periureteral stranding. Urinary bladder is unremarkable. GI AND BOWEL: Stomach demonstrates no acute abnormality. Small duodenal diverticulum. Dense stool in the sigmoid colon with associated bowel wall thickening and trace adjacent stranding. There is no bowel obstruction. PERITONEUM AND RETROPERITONEUM: No absence. No free air. VASCULATURE: Aorta is normal in caliber. Aortic atherosclerotic calcification. LYMPH NODES: No lymphadenopathy. REPRODUCTIVE ORGANS: No acute abnormality. BONES AND SOFT TISSUES: No acute osseous abnormality. No focal soft tissue abnormality. IMPRESSION: 1. Nonspecific sigmoid colitis, likely infectious or inflammatory and similar to 4 / few 11 / 25 . Electronically signed by: Norman Gatlin MD 08/31/2024 04:58 PM EST RP Workstation: [id number]   DG Abd Portable  2 Views Result Date: 08/31/2024 CLINICAL DATA:  Abdominal pain for 2 weeks.  Constipation. EXAM: PORTABLE ABDOMEN - 2 VIEW COMPARISON:  07/19/2022.  FINDINGS: Stool is seen in the majority of the colon. Scattered colonic air-fluid levels. No small bowel dilatation. No unexpected radiopaque calculi. Visualized lung bases are clear. IMPRESSION: Stool is seen in the majority of the colon, compatible with the provided history of constipation. Electronically Signed   By: Newell Eke M.D.   On: 08/31/2024 14:30       Assessment and Plan:  63 y.o. female with medical history significant of recurrent pyloric stenosis requiring monthly dilation pending gastric POEM procedure,  Sjogren's syndrome, hypothyroidism, HTN,  presents to Emergency Department for evaluation of intermittent, cramping LLQ abdominal pain, constipation, nausea and vomiting for almost 2 weeks.  CT abdomen pelvis done here indicative of sigmoid colitis, similar to prior CT findings.   Constipation  Sigmoid colitis  - no obstruction noted on CT. Findings similar to piror CT findings.  Sigmoidoscopy: Rectal ulceration and suspected corral ulcer (01/2024)  - NPO pending GI evaluation tomorrow, recommendations appreciated  - f/u CRP  - zosyn  - IVF - Supportive care   Recurrent pyloric stenosis s/p repeat dilation (most recently 08/06/24), pending gastric POEMS procedure  - continue IV Protonix    HTN Hypothyroidism  - resume home medications when able to tolerate PO   Lovenox   NPO  LR@100  Monitor/replace electrolytes    Advance Care Planning:   Code Status: Full Code discussed with patient at the time of admission   Consults: GI    Severity of Illness: The appropriate patient status for this patient is INPATIENT. Inpatient status is judged to be reasonable and necessary in order to provide the required intensity of service to ensure the patient's safety. The patient's presenting symptoms, physical exam findings, and initial radiographic and laboratory data in the context of their chronic comorbidities is felt to place them at high risk for further clinical  deterioration. Furthermore, it is not anticipated that the patient will be medically stable for discharge from the hospital within 2 midnights of admission.   * I certify that at the point of admission it is my clinical judgment that the patient will require inpatient hospital care spanning beyond 2 midnights from the point of admission due to high intensity of service, high risk for further deterioration and high frequency of surveillance required.*  Author: Daved JAYSON Pump, DO 08/31/2024 8:13 PM  For on call review [email or web address].

## 2024-08-31 NOTE — ED Triage Notes (Addendum)
 Pt reports with lower abdominal pain x 2 weeks. Pt states that she hasn't had a bowel movement since the 29th of October. Pt reports not being able to keep anything down and feels like she has a blockage. Pt reports taking otc medications with no relief.

## 2024-08-31 NOTE — ED Provider Notes (Signed)
 [place] EMERGENCY DEPARTMENT AT [facility] Provider Note   CSN: [id number] Arrival date & time: 08/31/24  1329     Patient presents with: Abdominal Pain   Gina Wise is a 63 y.o. female with past medical history of hypothyroidism, migraine, HTN, HLD, IDA, GERD, Schatzki's ring, lupus, pyloric stenosis presents to Emergency Department for evaluation of abdominal pain, vomiting, constipation that started on 08/19/2024.  Reports that she believes that she has not passed gas since the 29th but has been belching.  Last significant BM was on 08/19/24.  Anytime she eats or takes medication, she vomits.  Tried magnesium  2 days ago without having a bowel movement.  Tried glycerin suppository yesterday with some clear, yellow, brown liquid stool.  Denies fevers, urinary symptoms  Follows [facility] but was recommended to follow-up with [facility] whom she saw 5 days ago for recurrent pyloric stenosis.     Abdominal Pain      Prior to Admission medications   Medication Sig Start Date End Date Taking? Authorizing Provider  acetaminophen  (TYLENOL ) 500 MG tablet Take 1,000 mg by mouth as needed for mild pain (pain score 1-3) or headache. 12/26/16  Yes [provider]  carboxymethylcellulose (REFRESH PLUS) 0.5 % SOLN Place 1 drop into both eyes 3 (three) times daily as needed (dry/irritated eyes).   Yes [provider]  cetirizine (ZYRTEC) 10 MG tablet Take 10 mg by mouth daily.   Yes [provider]  dicyclomine  (BENTYL ) 20 MG tablet Take 1 tablet (20 mg total) by mouth 3 (three) times daily as needed for spasms. 06/18/24  Yes Pyrtle, Gordy HERO, MD  diphenhydrAMINE  (BENADRYL ) 25 MG tablet Take 25 mg by mouth every 6 (six) hours as needed for allergies.   Yes [provider]  hydrochlorothiazide  (HYDRODIURIL ) 25 MG tablet TAKE 1 TABLET(25 MG) BY MOUTH DAILY Patient taking differently: Take 25 mg by mouth in the morning. 05/11/24  Yes Almarie Waddell NOVAK, NP   levothyroxine  (SYNTHROID ) 150 MCG tablet Take 1 tablet (150 mcg total) by mouth daily. Patient taking differently: Take 150 mcg by mouth daily before breakfast. 08/13/23  Yes Almarie Waddell NOVAK, NP  ondansetron  (ZOFRAN -ODT) 4 MG disintegrating tablet Take 1 tablet (4 mg total) by mouth every 8 (eight) hours as needed for nausea or vomiting. Patient taking differently: Take 4 mg by mouth every 8 (eight) hours as needed for nausea or vomiting (DISSOLVE ORALLY). 02/27/24  Yes Almarie Waddell NOVAK, NP  pantoprazole  (PROTONIX ) 40 MG tablet Take 1 tablet (40 mg total) by mouth 2 (two) times daily. Patient taking differently: Take 40 mg by mouth 2 (two) times daily before a meal. 05/13/24  Yes Charlanne Groom, MD  promethazine  (PHENERGAN ) 12.5 MG tablet Take 1 tablet (12.5 mg total) by mouth every 8 (eight) hours as needed for nausea or vomiting. 06/18/24  Yes Pyrtle, Gordy HERO, MD  sertraline  (ZOLOFT ) 100 MG tablet TAKE ONE-HALF TABLET(50 MG) BY MOUTH EVERY DAY Patient taking differently: Take 50 mg by mouth in the morning. 05/11/24  Yes Almarie Waddell NOVAK, NP  SUMAtriptan  (IMITREX ) 100 MG tablet May repeat in 2 hours if headache persists(Plz sched with new provider) Patient taking differently: Take 100 mg by mouth daily as needed for migraine (may repeat ONCE in 2 hours, if no relief). 01/10/24  Yes Almarie Waddell NOVAK, NP    Allergies: Contrast media [iodinated contrast media], Other, Codeine, Erythromycin, Iohexol , Metoclopramide , and Prochlorperazine     Review of Systems  Gastrointestinal:  Positive for  abdominal pain.    Updated Vital Signs BP (!) 141/77 (BP Location: Right Arm)   Pulse 78   Temp 98.2 F (36.8 C) (Oral)   Resp 18   Ht 5' 6 (1.676 m)   Wt 78.5 kg   LMP 04/03/2012 Comment: Still has Cervix and Ovaries  SpO2 95%   BMI 27.92 kg/m   Physical Exam Vitals and nursing note reviewed.  Constitutional:      General: She is not in acute distress.    Appearance: Normal appearance. She is not  ill-appearing.  HENT:     Head: Normocephalic and atraumatic.     Mouth/Throat:     Mouth: Mucous membranes are dry.  Eyes:     Conjunctiva/sclera: Conjunctivae normal.  Cardiovascular:     Rate and Rhythm: Normal rate.  Pulmonary:     Effort: Pulmonary effort is normal. No respiratory distress.  Abdominal:     Palpations: Abdomen is soft.     Tenderness: There is generalized abdominal tenderness. There is no right CVA tenderness, left CVA tenderness, guarding or rebound.     Comments: Nonsurgical abdomen with no peritoneal signs  Skin:    Coloration: Skin is not jaundiced or pale.  Neurological:     Mental Status: She is alert and oriented to person, place, and time. Mental status is at baseline.     (all labs ordered are listed, but only abnormal results are displayed) Labs Reviewed  COMPREHENSIVE METABOLIC PANEL WITH GFR - Abnormal; Notable for the following components:      Result Value   CO2 21 (*)    Glucose, Bld 106 (*)    Total Protein 8.6 (*)    Anion gap 17 (*)    All other components within normal limits  CBC WITH DIFFERENTIAL/PLATELET - Abnormal; Notable for the following components:   WBC 13.4 (*)    RBC 5.19 (*)    Hemoglobin 15.1 (*)    Platelets 411 (*)    Neutro Abs 9.4 (*)    All other components within normal limits  URINALYSIS, ROUTINE W REFLEX MICROSCOPIC - Abnormal; Notable for the following components:   Ketones, ur 5 (*)    All other components within normal limits  LIPASE, BLOOD  CBC  CREATININE, SERUM  HIV ANTIBODY (ROUTINE TESTING W REFLEX)  CBC  BASIC METABOLIC PANEL WITH GFR  C-REACTIVE PROTEIN  I-STAT CG4 LACTIC ACID, ED    EKG: None  Radiology: CT ABDOMEN PELVIS WO CONTRAST Result Date: 08/31/2024 EXAM: CT ABDOMEN AND PELVIS WITHOUT CONTRAST 08/31/2024 04:33:48 PM TECHNIQUE: CT of the abdomen and pelvis was performed without the administration of intravenous contrast. Multiplanar reformatted images are provided for review.  Automated exposure control, iterative reconstruction, and/or weight-based adjustment of the mA/kV was utilized to reduce the radiation dose to as low as reasonably achievable. COMPARISON: 01/31/2024 CLINICAL HISTORY: Abdominal pain, acute, nonlocalized; Bowel obstruction suspected. FINDINGS: LOWER CHEST: No acute abnormality. LIVER: The liver is unremarkable. GALLBLADDER AND BILE DUCTS: Gallbladder is unremarkable. No biliary ductal dilatation. SPLEEN: No acute abnormality. PANCREAS: No acute abnormality. ADRENAL GLANDS: No acute abnormality. KIDNEYS, URETERS AND BLADDER: No stones in the kidneys or ureters. No hydronephrosis. No perinephric or periureteral stranding. Urinary bladder is unremarkable. GI AND BOWEL: Stomach demonstrates no acute abnormality. Small duodenal diverticulum. Dense stool in the sigmoid colon with associated bowel wall thickening and trace adjacent stranding. There is no bowel obstruction. PERITONEUM AND RETROPERITONEUM: No absence. No free air. VASCULATURE: Aorta is normal in caliber. Aortic atherosclerotic  calcification. LYMPH NODES: No lymphadenopathy. REPRODUCTIVE ORGANS: No acute abnormality. BONES AND SOFT TISSUES: No acute osseous abnormality. No focal soft tissue abnormality. IMPRESSION: 1. Nonspecific sigmoid colitis, likely infectious or inflammatory and similar to 4 / few 11 / 25 . Electronically signed by: Norman Gatlin MD 08/31/2024 04:58 PM EST RP Workstation: [id number]   DG Abd Portable 2 Views Result Date: 08/31/2024 CLINICAL DATA:  Abdominal pain for 2 weeks.  Constipation. EXAM: PORTABLE ABDOMEN - 2 VIEW COMPARISON:  07/19/2022. FINDINGS: Stool is seen in the majority of the colon. Scattered colonic air-fluid levels. No small bowel dilatation. No unexpected radiopaque calculi. Visualized lung bases are clear. IMPRESSION: Stool is seen in the majority of the colon, compatible with the provided history of constipation. Electronically Signed   By: Newell Eke M.D.    On: 08/31/2024 14:30   Medications Ordered in the ED  pantoprazole  (PROTONIX ) injection 40 mg (40 mg Intravenous Given 08/31/24 2104)  enoxaparin  (LOVENOX ) injection 40 mg (40 mg Subcutaneous Given 08/31/24 2105)  lactated ringers  infusion ( Intravenous Infusion Verify 08/31/24 2156)  ondansetron  (ZOFRAN ) tablet 4 mg ( Oral See Alternative 08/31/24 2105)    Or  ondansetron  (ZOFRAN ) injection 4 mg (4 mg Intravenous Given 08/31/24 2105)  morphine  (PF) 4 MG/ML injection 4 mg (4 mg Intravenous Given 08/31/24 2104)  piperacillin-tazobactam (ZOSYN) IVPB 3.375 g (has no administration in time range)  sodium chloride  0.9 % bolus 1,000 mL (0 mLs Intravenous Stopped 08/31/24 1740)  morphine  (PF) 4 MG/ML injection 4 mg (4 mg Intravenous Given 08/31/24 1616)  ondansetron  (ZOFRAN ) injection 4 mg (4 mg Intravenous Given 08/31/24 1616)  promethazine  (PHENERGAN ) 12.5 mg in sodium chloride  0.9 % 50 mL IVPB (12.5 mg Intravenous New Bag/Given 08/31/24 1807)  morphine  (PF) 4 MG/ML injection 4 mg (4 mg Intravenous Given 08/31/24 1757)  piperacillin-tazobactam (ZOSYN) IVPB 3.375 g (3.375 g Intravenous New Bag/Given 08/31/24 1749)    Clinical Course as of 08/31/24 2324  Mon Aug 31, 2024  1623 Anion gap(!): 17 No lactic acidosis. No signs of DKA. No ETOH, ethylene glycol. No signs of uremia. Likely 2/2 starvation ketoacidosis as she has been unable to keep fluids down at home. [LB]    Clinical Course User Index [LB] Minnie Tinnie BRAVO, PA                                 Medical Decision Making Amount and/or Complexity of Data Reviewed Labs: ordered. Decision-making details documented in ED Course. Radiology: ordered.  Risk Prescription drug management. Decision regarding hospitalization.   Patient presents to the ED for concern of abdominal pain, vomiting, this involves an extensive number of treatment options, and is a complaint that carries with it a high risk of complications and morbidity.  The  differential diagnosis includes obstruction, perforation, pancreatitis, diverticulitis, pyloric stenosis   Co morbidities that complicate the patient evaluation  History of pyloric stenosis requiring dilation See HPI   Additional history obtained:  Additional history obtained from Nursing and Outside Medical Records   External records from outside source obtained and reviewed including triage note, [facility] note from 08/26/2024   Lab Tests:  I Ordered, and personally interpreted labs.  The pertinent results include:   Mild leukocytosis of 13.4 Lactic acid WNL UA without infection nor Hgb   Imaging Studies ordered:  I ordered imaging studies including CT abdomen pelvis, abdomen x-ray I independently visualized and interpreted imaging which showed Nonspecific sigmoid  colitis, likely infectious or inflammatory  I agree with the radiologist interpretation     Medicines ordered and prescription drug management:  I ordered medication including Zofran , Phenergan , morphine , Zosyn, IVF for colitis, pain, N/V Reevaluation of the patient after these medicines showed that the patient stayed the same I have reviewed the patients home medicines and have made adjustments as needed    Consultations Obtained:  I requested consultation with [facility] Dr. Victory Brand,  and discussed lab and imaging findings as well as pertinent plan - they recommend:  NPO except ice chips Will see during admission tomorrow  I requested consultation with hospitalist, and discussed lab and imaging findings as well as pertinent plan - Dr. Collie accepts patient for admission   Problem List / ED Course:  Colitis Intractable vomiting Abdominal pain No signs of systemic infection nor sepsis.  Does have mildly elevated leukocytosis however no fever no tachycardia.  Lactic acid WNL. Electrolytes WNL.  No elevated creatinine Mildly elevated anion gap of 17 likely secondary to dehydration as patient has not been  unable to keep fluids down at home.  Did provide 1 L IVF for hydration. CT imaging notable for colitis which is similar to previous CTs in the past. Attempted to provide patient with Augmentin  for infectious colitis however patient gagged and threw this up.  Was unable to pass p.o. challenge so did provide IV Zosyn  for colitis (has a history of C. difficile to avoid multiple antibiotics and cause reoccurrence of this).   Did admit patient for intractable vomiting and pain as pain did not significantly improved following morphine .  Did provide Phenergan  and Zofran  without improvement to intractable vomiting Hospitalist admission and GI consulted   Reevaluation:  After the interventions noted above, I reevaluated the patient and found that they have :stayed the same     Dispostion:  After consideration of the diagnostic results and the patients response to treatment, I feel that the patent would benefit from admission for IVF, IV antibiotics, intractable vomiting and pain, GI consult.   Discussed ED workup, disposition with patient expressed understanding agrees with plan.  All questions answered to her satisfaction.  She is agreeable to plan  Final diagnoses:  Colitis  Intractable vomiting with nausea    ED Discharge Orders     None        Minnie Tinnie BRAVO, PA 08/31/24 2324    Emil Share, DO 08/31/24 2326

## 2024-08-31 NOTE — Progress Notes (Signed)
 A consult was received from an ED physician for Zosyn per pharmacy dosing (for an indication other than meningitis). The patient's profile has been reviewed for ht/wt/allergies/indication/available labs. A one time order has been placed for the above antibiotics.  Further antibiotics/pharmacy consults should be ordered by admitting physician if indicated.                       Bard Jeans, PharmD, BCPS [phone] 08/31/2024, 5:41 PM

## 2024-08-31 NOTE — Plan of Care (Signed)
  Problem: Education: Goal: Knowledge of General Education information will improve Description: Including pain rating scale, medication(s)/side effects and non-pharmacologic comfort measures Outcome: Progressing   Problem: Clinical Measurements: Goal: Ability to maintain clinical measurements within normal limits will improve Outcome: Progressing   Problem: Elimination: Goal: Will not experience complications related to bowel motility Outcome: Progressing Goal: Will not experience complications related to urinary retention Outcome: Progressing   Problem: Pain Managment: Goal: General experience of comfort will improve and/or be controlled Outcome: Progressing   Problem: Safety: Goal: Ability to remain free from injury will improve Outcome: Progressing   Problem: Skin Integrity: Goal: Risk for impaired skin integrity will decrease Outcome: Progressing

## 2024-09-01 ENCOUNTER — Encounter (HOSPITAL_COMMUNITY)
Admission: EM | Disposition: A | Discharge status: Home / Self Care | Payer: Self-pay | Source: Home / Self Care | Attending: Internal Medicine

## 2024-09-01 ENCOUNTER — Inpatient Hospital Stay (HOSPITAL_COMMUNITY): Admitting: Certified Registered Nurse Anesthetist

## 2024-09-01 ENCOUNTER — Encounter (HOSPITAL_COMMUNITY): Payer: Self-pay | Admitting: Emergency Medicine

## 2024-09-01 DIAGNOSIS — K311 Adult hypertrophic pyloric stenosis: Secondary | ICD-10-CM

## 2024-09-01 DIAGNOSIS — K2970 Gastritis, unspecified, without bleeding: Secondary | ICD-10-CM

## 2024-09-01 DIAGNOSIS — K2990 Gastroduodenitis, unspecified, without bleeding: Secondary | ICD-10-CM

## 2024-09-01 DIAGNOSIS — K449 Diaphragmatic hernia without obstruction or gangrene: Secondary | ICD-10-CM

## 2024-09-01 DIAGNOSIS — Z9889 Other specified postprocedural states: Secondary | ICD-10-CM

## 2024-09-01 DIAGNOSIS — K3189 Other diseases of stomach and duodenum: Secondary | ICD-10-CM

## 2024-09-01 DIAGNOSIS — K5900 Constipation, unspecified: Secondary | ICD-10-CM | POA: Diagnosis not present

## 2024-09-01 DIAGNOSIS — R112 Nausea with vomiting, unspecified: Secondary | ICD-10-CM

## 2024-09-01 DIAGNOSIS — K529 Noninfective gastroenteritis and colitis, unspecified: Secondary | ICD-10-CM | POA: Diagnosis not present

## 2024-09-01 DIAGNOSIS — R109 Unspecified abdominal pain: Secondary | ICD-10-CM

## 2024-09-01 HISTORY — PX: ESOPHAGOGASTRODUODENOSCOPY: SHX5428

## 2024-09-01 LAB — BASIC METABOLIC PANEL WITH GFR
Anion gap: 11 (ref 5–15)
BUN: 16 mg/dL (ref 8–23)
CO2: 25 mmol/L (ref 22–32)
Calcium: 9 mg/dL (ref 8.9–10.3)
Chloride: 102 mmol/L (ref 98–111)
Creatinine, Ser: 0.87 mg/dL (ref 0.44–1.00)
GFR, Estimated: >60 mL/min (ref >60)
Glucose, Bld: 93 mg/dL (ref 70–99)
Potassium: 3.3 mmol/L — ABNORMAL LOW (ref 3.5–5.1)
Sodium: 138 mmol/L (ref 135–145)

## 2024-09-01 LAB — CBC
HCT: 39.6 % (ref 36.0–46.0)
Hemoglobin: 13.1 g/dL (ref 12.0–15.0)
MCH: 29.6 pg (ref 26.0–34.0)
MCHC: 33.1 g/dL (ref 30.0–36.0)
MCV: 89.4 fL (ref 80.0–100.0)
Platelets: 298 K/uL (ref 150–400)
RBC: 4.43 MIL/uL (ref 3.87–5.11)
RDW: 13.5 % (ref 11.5–15.5)
WBC: 10.5 K/uL (ref 4.0–10.5)
nRBC: 0 % (ref 0.0–0.2)

## 2024-09-01 LAB — C-REACTIVE PROTEIN: CRP: <0.5 mg/dL (ref <1.0)

## 2024-09-01 LAB — HIV ANTIBODY (ROUTINE TESTING W REFLEX): HIV Screen 4th Generation wRfx: NONREACTIVE

## 2024-09-01 SURGERY — EGD (ESOPHAGOGASTRODUODENOSCOPY)
Anesthesia: General

## 2024-09-01 MED ORDER — LIDOCAINE HCL (CARDIAC) PF 100 MG/5ML IV SOSY
PREFILLED_SYRINGE | INTRAVENOUS | Status: DC | PRN
  Administered 2024-09-01: 100 mg via INTRATRACHEAL

## 2024-09-01 MED ORDER — POTASSIUM CHLORIDE 10 MEQ/100ML IV SOLN
10.0000 meq | INTRAVENOUS | Status: AC
  Administered 2024-09-01: 10 meq via INTRAVENOUS
  Filled 2024-09-01 (×2): qty 100

## 2024-09-01 MED ORDER — DEXAMETHASONE SOD PHOSPHATE PF 10 MG/ML IJ SOLN
INTRAMUSCULAR | Status: DC | PRN
  Administered 2024-09-01: 10 mg via INTRAVENOUS

## 2024-09-01 MED ORDER — POLYETHYLENE GLYCOL 3350 17 GM/SCOOP PO POWD
119.0000 g | Freq: Once | ORAL | Status: AC
  Administered 2024-09-01: 119 g via ORAL
  Filled 2024-09-01: qty 119

## 2024-09-01 MED ORDER — SERTRALINE HCL 50 MG PO TABS
50.0000 mg | ORAL_TABLET | Freq: Every day | ORAL | Status: DC
  Administered 2024-09-01 – 2024-09-04 (×4): 50 mg via ORAL
  Filled 2024-09-01 (×4): qty 1

## 2024-09-01 MED ORDER — ONDANSETRON HCL 4 MG/2ML IJ SOLN
INTRAMUSCULAR | Status: DC | PRN
Start: 2024-09-01 — End: 2024-09-01
  Administered 2024-09-01: 4 mg via INTRAVENOUS

## 2024-09-01 MED ORDER — DROPERIDOL 2.5 MG/ML IJ SOLN
0.6250 mg | Freq: Once | INTRAMUSCULAR | Status: AC
  Administered 2024-09-01: 0.625 mg via INTRAVENOUS
  Filled 2024-09-01 (×2): qty 2

## 2024-09-01 MED ORDER — SUCCINYLCHOLINE CHLORIDE 200 MG/10ML IV SOSY
PREFILLED_SYRINGE | INTRAVENOUS | Status: DC | PRN
  Administered 2024-09-01: 100 mg via INTRAVENOUS

## 2024-09-01 MED ORDER — SMOG ENEMA
960.0000 mL | Freq: Once | RECTAL | Status: AC
  Administered 2024-09-01: 960 mL via RECTAL
  Filled 2024-09-01: qty 960

## 2024-09-01 MED ORDER — POTASSIUM CHLORIDE 10 MEQ/100ML IV SOLN
10.0000 meq | INTRAVENOUS | Status: AC
Start: 2024-09-01 — End: 2024-09-01
  Administered 2024-09-01 (×3): 10 meq via INTRAVENOUS
  Filled 2024-09-01 (×2): qty 100

## 2024-09-01 MED ORDER — PROPOFOL 500 MG/50ML IV EMUL
INTRAVENOUS | Status: DC | PRN
  Administered 2024-09-01: 20 mg via INTRAVENOUS
  Administered 2024-09-01: 150 mg via INTRAVENOUS
  Administered 2024-09-01: 150 ug/kg/min via INTRAVENOUS

## 2024-09-01 MED ORDER — PNEUMOCOCCAL 20-VAL CONJ VACC 0.5 ML IM SUSY
0.5000 mL | PREFILLED_SYRINGE | INTRAMUSCULAR | Status: AC
  Administered 2024-09-02: 0.5 mL via INTRAMUSCULAR
  Filled 2024-09-01: qty 0.5

## 2024-09-01 MED ORDER — SODIUM CHLORIDE 0.9 % IV SOLN
INTRAVENOUS | Status: AC | PRN
Start: 2024-09-01 — End: 2024-09-01
  Administered 2024-09-01: 500 mL via INTRAVENOUS

## 2024-09-01 MED ORDER — SODIUM CHLORIDE 0.9 % IV SOLN: INTRAVENOUS | Status: AC

## 2024-09-01 NOTE — Plan of Care (Signed)
  Problem: Health Behavior/Discharge Planning: Goal: Ability to manage health-related needs will improve Outcome: Progressing   Problem: Activity: Goal: Risk for activity intolerance will decrease Outcome: Progressing   Problem: Nutrition: Goal: Adequate nutrition will be maintained Outcome: Progressing   

## 2024-09-01 NOTE — Consult Note (Signed)
 Referring Provider: EDP Primary Care Physician:  Almarie Waddell NOVAK, NP Primary Gastroenterologist:  Dr. Albertus  Reason for Consultation:  Colitis   HPI: Gina Wise is a 63 y.o. female with medical history significant of recurrent pyloric stenosis requiring monthly dilation pending gastric POEM procedure at [facility] in December, Sjogren's syndrome,  hypothyroidism, HTN,  presents to Emergency Department for evaluation of intermittent cramping LLQ abdominal pain, constipation, nausea and vomiting for almost 2 weeks. Symptoms started with non-bloody diarrhea on 10/29, however, that was her last BM.  She tried taking milk of mag and suppositories given at home without relief.  She reports PO intake again at home recently, not even tolerating liquids well that is c/w her pyloric stenosis symptoms.    She has had similar episodes in the past (April) with CT scans showing coltis.  She had sigmoidoscopy in April 2025 at [facility] revealing rectal ulceration suspected stercoral ulcer and hemorrhoids.  CT scan abdomen and pelvis without contrast here showed nonspecific sigmoid colitis likely infectious or inflammatory.  There is dense stool in the sigmoid colon associated bowel wall thickening and trace adjacent stranding.  EGD 07/2024: - Normal esophagus. - Small hiatal hernia. - A Nissen fundoplication was found. The wrap appears intact. - Gastritis. - Gastric stenosis was found at the pylorus. Dilated to 18 mm with balloon. - Duodenitis. - No specimens collected.  Colonoscopy 12/2022: - Moderate diverticulosis in the sigmoid colon, at the hepatic flexure and in the ascending colon. There was sharp angulation of the colon in association with the diverticular opening in the mid sigmoid. This likely in part explains constipation symptom. - The examination was otherwise normal on direct and retroflexion views. - No specimens collected.  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    Allergy    Anemia    Anxiety    Aortic atherosclerosis    Bruising 05/10/2015   CAP (community acquired pneumonia) 08/03/2015   Chronic bronchitis (HCC)    get it q yr; in the spring time (02/17/2014)   COVID-19    Depression    have taken zoloft  since 1996 (02/17/2014)   Diverticulosis    Duodenal diverticulum    Dyspnea    from scar tissue   Epistaxis 12/01/2015   Gastritis    GERD (gastroesophageal reflux disease)    Hematemesis with nausea 08/01/2016   Hiatal hernia    High cholesterol    Hypertension    Hypothyroid    Internal hemorrhoids    Migraines    maybe a couple/yr (02/17/2014)   Multiple environmental allergies    Overweight(278.02)    Peri-menopause    PONV (postoperative nausea and vomiting)    Pre-diabetes    Schatzki's ring    Sjogren's disease    Systemic lupus (HCC)     Past Surgical History:  Procedure Laterality Date   APPENDECTOMY  1987   BREAST BIOPSY Left 01/30/2018   BREAST EXCISIONAL BIOPSY Left 02/25/2018   ESOPHAGEAL MANOMETRY N/A 05/02/2022   Procedure: ESOPHAGEAL MANOMETRY (EM);  Surgeon: Albertus Gordy HERO, MD;  Location: [facility];  Service: Gastroenterology;  Laterality: N/A;   ESOPHAGOGASTRODUODENOSCOPY N/A 02/28/2014   Procedure: ESOPHAGOGASTRODUODENOSCOPY (EGD);  Surgeon: Gordy HERO Albertus, MD;  Location: [facility] ENDOSCOPY;  Service: Endoscopy;  Laterality: N/A;   ESOPHAGOGASTRODUODENOSCOPY N/A 08/02/2016   Procedure: ESOPHAGOGASTRODUODENOSCOPY (EGD);  Surgeon: Lupita FORBES Commander, MD;  Location: [facility] ENDOSCOPY;  Service: Endoscopy;  Laterality: N/A;   EXPLORATORY LAPAROTOMY  1987   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   FOOT SURGERY Right ~1980   don't know what they did; had to take something out   HERNIA REPAIR     INGUINAL HERNIA REPAIR Right ~ 2003   RADIOACTIVE SEED GUIDED EXCISIONAL BREAST BIOPSY Left 02/25/2018   Procedure: RADIOACTIVE SEED GUIDED EXCISIONAL BREAST BIOPSY;  Surgeon: Ebbie Cough, MD;  Location: [facility]  [place];  Service: General;  Laterality: Left;   SHOULDER OPEN ROTATOR CUFF REPAIR Left 1995   SHOULDER SURGERY Left 1997   cartilage   TONSILLECTOMY AND ADENOIDECTOMY  1976   TUBAL LIGATION  1998   laparoscopic   VAGINAL HYSTERECTOMY  04/08/2012   menorrhagia   VAGINAL PROLAPSE REPAIR  08/04/2018   Anterior and posterior with colp-mesh-Dr. Comer Ku @ [facility]   XI ROBOTIC ASSISTED HIATAL HERNIA REPAIR N/A 05/21/2022   Procedure: XI ROBOTIC ASSISTED HIATAL HERNIA REPAIR WITH TOUPET FUNDOPLICATION;  Surgeon: Signe Mitzie LABOR, MD;  Location: [facility]S;  Service: General;  Laterality: N/A;    Prior to Admission medications   Medication Sig Start Date End Date Taking? Authorizing Provider  acetaminophen  (TYLENOL ) 500 MG tablet Take 1,000 mg by mouth as needed for mild pain (pain score 1-3) or headache. 12/26/16  Yes [provider]  carboxymethylcellulose (REFRESH PLUS) 0.5 % SOLN Place 1 drop into both eyes 3 (three) times daily as needed (dry/irritated eyes).   Yes [provider]  cetirizine (ZYRTEC) 10 MG tablet Take 10 mg by mouth daily.   Yes [provider]  dicyclomine  (BENTYL ) 20 MG tablet Take 1 tablet (20 mg total) by mouth 3 (three) times daily as needed for spasms. 06/18/24  Yes Pyrtle, Gordy HERO, MD  diphenhydrAMINE  (BENADRYL ) 25 MG tablet Take 25 mg by mouth every 6 (six) hours as needed for allergies.   Yes [provider]  hydrochlorothiazide  (HYDRODIURIL ) 25 MG tablet TAKE 1 TABLET(25 MG) BY MOUTH DAILY Patient taking differently: Take 25 mg by mouth in the morning. 05/11/24  Yes Almarie Waddell NOVAK, NP  levothyroxine  (SYNTHROID ) 150 MCG tablet Take 1 tablet (150 mcg total) by mouth daily. Patient taking differently: Take 150 mcg by mouth daily before breakfast. 08/13/23  Yes Almarie Waddell NOVAK, NP  ondansetron  (ZOFRAN -ODT) 4 MG disintegrating tablet Take 1 tablet (4 mg total) by mouth every 8 (eight) hours as needed for nausea or  vomiting. Patient taking differently: Take 4 mg by mouth every 8 (eight) hours as needed for nausea or vomiting (DISSOLVE ORALLY). 02/27/24  Yes Almarie Waddell NOVAK, NP  pantoprazole  (PROTONIX ) 40 MG tablet Take 1 tablet (40 mg total) by mouth 2 (two) times daily. Patient taking differently: Take 40 mg by mouth 2 (two) times daily before a meal. 05/13/24  Yes Charlanne Groom, MD  promethazine  (PHENERGAN ) 12.5 MG tablet Take 1 tablet (12.5 mg total) by mouth every 8 (eight) hours as needed for nausea or vomiting. 06/18/24  Yes Pyrtle, Gordy HERO, MD  sertraline  (ZOLOFT ) 100 MG tablet TAKE ONE-HALF TABLET(50 MG) BY MOUTH EVERY DAY Patient taking differently: Take 50 mg by mouth in the morning. 05/11/24  Yes Almarie Waddell NOVAK, NP  SUMAtriptan  (IMITREX ) 100 MG tablet May repeat in 2 hours if headache persists(Plz sched with new provider) Patient taking differently: Take 100 mg by mouth daily as needed for migraine (may repeat ONCE in 2 hours, if no relief). 01/10/24  Yes Almarie Waddell NOVAK, NP    Current Facility-Administered Medications  Medication Dose Route Frequency Provider Last Rate Last Admin  enoxaparin  (LOVENOX ) injection 40 mg  40 mg Subcutaneous Q24H Krugh, Marissa C, DO   40 mg at 08/31/24 2105   lactated ringers  infusion   Intravenous Continuous Krugh, Marissa C, DO   Stopped at 09/01/24 0515   morphine  (PF) 4 MG/ML injection 4 mg  4 mg Intravenous Q2H PRN Krugh, Marissa C, DO   4 mg at 09/01/24 0932   ondansetron  (ZOFRAN ) tablet 4 mg  4 mg Oral Q6H PRN Krugh, Marissa C, DO       Or   ondansetron  (ZOFRAN ) injection 4 mg  4 mg Intravenous Q6H PRN Krugh, Marissa C, DO   4 mg at 09/01/24 9068   pantoprazole  (PROTONIX ) injection 40 mg  40 mg Intravenous Q12H Krugh, Marissa C, DO   40 mg at 08/31/24 2104   piperacillin-tazobactam (ZOSYN) IVPB 3.375 g  3.375 g Intravenous Q8H Mark Bard LABOR, RPH 12.5 mL/hr at 09/01/24 9390 Infusion Verify at 09/01/24 0609   [START ON 09/02/2024] pneumococcal 20-valent conjugate  vaccine (PREVNAR 20) injection 0.5 mL  0.5 mL Intramuscular Tomorrow-1000 Krugh, Marissa C, DO       potassium chloride  10 mEq in 100 mL IVPB  10 mEq Intravenous Q1 Hr x 4 Patsy Lenis, MD 100 mL/hr at 09/01/24 0943 10 mEq at 09/01/24 0943    Allergies as of 08/31/2024 - Review Complete 08/31/2024  Allergen Reaction Noted   Contrast media [iodinated contrast media] Hives and Itching 03/06/2016   Other Hives, Swelling, and Other (See Comments) 03/06/2016   Codeine Nausea Only    Erythromycin Nausea And Vomiting and Other (See Comments)    Iohexol  Hives 02/12/2014   Metoclopramide  Other (See Comments) 04/06/2024   Prochlorperazine  Other (See Comments) 04/06/2024    Family History  Problem Relation Age of Onset   Hypertension Mother    Heart disease Mother    Hepatitis Mother        Hep C   Endometriosis Mother    Pancreatic disease Mother    Depression Mother    Hearing loss Mother    Hypertension Father    Vision loss Father    Alcohol abuse Father    Emphysema Father        Smoker   Hearing loss Father    Early death Father        Murdered   Lupus Sister    Endometriosis Sister    Arthritis Sister    Depression Sister    Uterine cancer Maternal Aunt        50's   Heart attack Maternal Uncle    Early death Maternal Uncle        Murdered   Breast cancer Paternal Aunt        76's   Early death Paternal Aunt    Emphysema Paternal Aunt        Non-smoker/Died of Pneumonia   Lung disease Paternal Aunt        Bronchiecstasis   Lung cancer Paternal Uncle        smoker   Lung cancer Paternal Uncle    Lung cancer Paternal Uncle    Early death Paternal Uncle        Carbon Monoxide Poisoning/Accidental   Parkinson's disease Maternal Grandmother    Alcohol abuse Maternal Grandmother    Colon cancer Maternal Grandfather    Heart disease Paternal Grandmother        Died during angioplasty surgery   Aneurysm Paternal Grandfather    Allergy (severe) Son  ASA-Idiopathic Thrombocytopenia   Pancreatic cancer Neg Hx    Esophageal cancer Neg Hx    Rectal cancer Neg Hx    Stomach cancer Neg Hx     Social History   Socioeconomic History   Marital status: Single    Spouse name: Not on file   Number of children: 2   Years of education: Not on file   Highest education level: Associate degree: occupational, scientist, product/process development, or vocational program  Occupational History   Not on file  Tobacco Use   Smoking status: Never   Smokeless tobacco: Never  Vaping Use   Vaping status: Never Used  Substance and Sexual Activity   Alcohol use: Yes    Comment: rarely   Drug use: No   Sexual activity: Not on file  Other Topics Concern   Not on file  Social History Narrative   Works at [employer]   Social Drivers of Health   Financial Resource Strain: Medium Risk (12/19/2023)   Overall Financial Resource Strain (CARDIA)    Difficulty of Paying Living Expenses: Somewhat hard  Food Insecurity: No Food Insecurity (08/31/2024)   Hunger Vital Sign    Worried About Running Out of Food in the Last Year: Never true    Ran Out of Food in the Last Year: Never true  Transportation Needs: No Transportation Needs (08/31/2024)   PRAPARE - Administrator, Civil Service (Medical): No    Lack of Transportation (Non-Medical): No  Physical Activity: Unknown (06/20/2023)   Exercise Vital Sign    Days of Exercise per Week: 0 days    Minutes of Exercise per Session: Not on file  Stress: No Stress Concern Present (06/20/2023)   [employer] of Occupational Health - Occupational Stress Questionnaire    Feeling of Stress : Only a little  Social Connections: Socially Isolated (12/19/2023)   Social Connection and Isolation Panel    Frequency of Communication with Friends and Family: More than three times a week    Frequency of Social Gatherings with Friends and Family: Twice a week    Attends Religious Services: Never    Database Administrator or Organizations:  No    Attends Engineer, Structural: Not on file    Marital Status: Divorced  Intimate Partner Violence: Not At Risk (08/31/2024)   Humiliation, Afraid, Rape, and Kick questionnaire    Fear of Current or Ex-Partner: No    Emotionally Abused: No    Physically Abused: No    Sexually Abused: No    Review of Systems: ROS otherwise negative except as mentioned in HPI.  Physical Exam: Vital signs in last 24 hours: Temp:  [97.4 F (36.3 C)-98.7 F (37.1 C)] 97.4 F (36.3 C) (11/11 0413) Pulse Rate:  [69-108] 74 (11/11 0413) Resp:  [15-20] 20 (11/11 0413) BP: (132-145)/(65-87) 132/70 (11/11 0413) SpO2:  [95 %-99 %] 95 % (11/11 0413) Weight:  [78.5 kg] 78.5 kg (11/10 2104) Last BM Date : 08/19/24 General:  Alert, Well-developed, well-nourished, pleasant and cooperative in NAD Head:  Normocephalic and atraumatic. Eyes:  Sclera clear, no icterus.  Conjunctiva pink. Ears:  Normal auditory acuity. Mouth:  No deformity or lesions.   Lungs:  Clear throughout to auscultation.  No wheezes, crackles, or rhonchi.  Heart:  Regular rate and rhythm; no murmurs, clicks, rubs, or gallops. Abdomen:  Soft, non-distended.  BS present.  Diffuse tenderness to palpation, but greatest in the left lower quadrant. Msk:  Symmetrical without gross deformities. Pulses:  Normal pulses noted. Extremities:  Without clubbing or edema. Neurologic:  Alert and oriented x 4;  grossly normal neurologically. Skin:  Intact without significant lesions or rashes. Psych:  Alert and cooperative. Normal mood and affect.  Intake/Output from previous day: 11/10 0701 - 11/11 0700 In: 1675.6 [I.V.:527; IV Piggyback:1148.6] Out: -   Lab Results: Recent Labs    08/31/24 1344 08/31/24 2111 09/01/24 0627  WBC 13.4* 10.5 10.5  HGB 15.1* 13.7 13.1  HCT 45.3 41.6 39.6  PLT 411* 338 298   BMET Recent Labs    08/31/24 1344 08/31/24 2111 09/01/24 0627  NA 137  --  138  K 3.6  --  3.3*  CL 99  --  102  CO2  21*  --  25  GLUCOSE 106*  --  93  BUN 21  --  16  CREATININE 0.90 0.84 0.87  CALCIUM  10.0  --  9.0   LFT Recent Labs    08/31/24 1344  PROT 8.6*  ALBUMIN  4.8  AST 21  ALT 7  ALKPHOS 72  BILITOT 0.6   Studies/Results: CT ABDOMEN PELVIS WO CONTRAST Result Date: 08/31/2024 EXAM: CT ABDOMEN AND PELVIS WITHOUT CONTRAST 08/31/2024 04:33:48 PM TECHNIQUE: CT of the abdomen and pelvis was performed without the administration of intravenous contrast. Multiplanar reformatted images are provided for review. Automated exposure control, iterative reconstruction, and/or weight-based adjustment of the mA/kV was utilized to reduce the radiation dose to as low as reasonably achievable. COMPARISON: 01/31/2024 CLINICAL HISTORY: Abdominal pain, acute, nonlocalized; Bowel obstruction suspected. FINDINGS: LOWER CHEST: No acute abnormality. LIVER: The liver is unremarkable. GALLBLADDER AND BILE DUCTS: Gallbladder is unremarkable. No biliary ductal dilatation. SPLEEN: No acute abnormality. PANCREAS: No acute abnormality. ADRENAL GLANDS: No acute abnormality. KIDNEYS, URETERS AND BLADDER: No stones in the kidneys or ureters. No hydronephrosis. No perinephric or periureteral stranding. Urinary bladder is unremarkable. GI AND BOWEL: Stomach demonstrates no acute abnormality. Small duodenal diverticulum. Dense stool in the sigmoid colon with associated bowel wall thickening and trace adjacent stranding. There is no bowel obstruction. PERITONEUM AND RETROPERITONEUM: No absence. No free air. VASCULATURE: Aorta is normal in caliber. Aortic atherosclerotic calcification. LYMPH NODES: No lymphadenopathy. REPRODUCTIVE ORGANS: No acute abnormality. BONES AND SOFT TISSUES: No acute osseous abnormality. No focal soft tissue abnormality. IMPRESSION: 1. Nonspecific sigmoid colitis, likely infectious or inflammatory and similar to 4 / few 11 / 25 . Electronically signed by: Norman Gatlin MD 08/31/2024 04:58 PM EST RP Workstation:  [id number]   DG Abd Portable 2 Views Result Date: 08/31/2024 CLINICAL DATA:  Abdominal pain for 2 weeks.  Constipation. EXAM: PORTABLE ABDOMEN - 2 VIEW COMPARISON:  07/19/2022. FINDINGS: Stool is seen in the majority of the colon. Scattered colonic air-fluid levels. No small bowel dilatation. No unexpected radiopaque calculi. Visualized lung bases are clear. IMPRESSION: Stool is seen in the majority of the colon, compatible with the provided history of constipation. Electronically Signed   By: Newell Eke M.D.   On: 08/31/2024 14:30    IMPRESSION:  *Constipation with associated inflammatory changes in the sigmoid colon, suspicious for stercoral colitis *Pyloric stenosis status post dilatation.  Awaiting [facility]EM [facility] in December.  Having recurrent dysphagia/issues with tolerating PO.  PLAN: -Will need to clean her out, likely from above and may be below with a smog enema.  Unfortunately she is unable to tolerate even much liquid by mouth currently.  Will plan for EGD with pyloric dilation this afternoon with Dr. Suzann and then we will plan for [facility]  enema and bowel preparation. - Currently have her on Zosyn, which can be continued for now.   Harlene BIRCH. Tallia Moehring  09/01/2024, 9:51 AM

## 2024-09-01 NOTE — Transfer of Care (Signed)
 Immediate Anesthesia Transfer of Care Note  Patient: Gina Wise  Procedure(s) Performed: Procedure(s): EGD (ESOPHAGOGASTRODUODENOSCOPY) (N/A) Balloon dilation wire-guided  Patient Location: PACU  Anesthesia Type:General  Level of Consciousness:  sedated, patient cooperative and responds to stimulation  Airway & Oxygen Therapy:Patient Spontanous Breathing and Patient connected to face mask oxgen  Post-op Assessment:  Report given to PACU RN and Post -op Vital signs reviewed and stable  Post vital signs:  Reviewed and stable  Last Vitals:  Vitals:   09/01/24 1214 09/01/24 1345  BP: (!) 141/62 (!) 158/83  Pulse: 68 93  Resp: 16 (!) 21  Temp: 36.6 C 36.7 C  SpO2: 95% 100%    Complications: No apparent anesthesia complications

## 2024-09-01 NOTE — H&P (Signed)
 [facility] History and Physical   Primary Care Physician:  Almarie Waddell NOVAK, NP   Reason for Procedure:  Abdominal pain, nausea, vomiting, history of pyloric stenosis  Plan:    Upper endoscopy with dilation of pylorus   The patient was provided an opportunity to ask questions and all were answered. The patient agreed with the plan.   HPI: Gina Wise is a 63 y.o. female undergoing upper endoscopy with dilation of pylorus for abdominal pain, nausea, vomiting and history of pyloric stenosis.  Patient was admitted to the hospital with left lower quadrant abdominal pain, constipation and aforementioned symptoms for almost 2 weeks.  States that she had diarrhea on 1029 and subsequently has not had a bowel movement.  CTAP shows nonspecific sigmoid colitis-infectious or inflammatory.  Dense stool in the sigmoid colon.  Previous EGDs were reviewed and she has had prior fundoplication as well as stenosis of the pylorus that has been balloon dilated to 15-18 mm.  Most recent dilation was to 75 mm-she states she did not note a significant difference despite this.  She has been referred to Dr. Francois at [facility] for G POEM which is scheduled for mid December 2025.   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   Allergy    Anemia    Anxiety    Aortic atherosclerosis    Bruising 05/10/2015   CAP (community acquired pneumonia) 08/03/2015   Chronic bronchitis (HCC)    get it q yr; in the spring time (02/17/2014)   COVID-19    Depression    have taken zoloft  since 1996 (02/17/2014)   Diverticulosis    Duodenal diverticulum    Dyspnea    from scar tissue   Epistaxis 12/01/2015   Gastritis    GERD (gastroesophageal reflux disease)    Hematemesis with nausea 08/01/2016   Hiatal hernia    High cholesterol    Hypertension    Hypothyroid    Internal hemorrhoids    Migraines    maybe a couple/yr (02/17/2014)   Multiple environmental allergies     Overweight(278.02)    Peri-menopause    PONV (postoperative nausea and vomiting)    Pre-diabetes    Schatzki's ring    Sjogren's disease    Systemic lupus (HCC)     Past Surgical History:  Procedure Laterality Date   APPENDECTOMY  1987   BREAST BIOPSY Left 01/30/2018   BREAST EXCISIONAL BIOPSY Left 02/25/2018   ESOPHAGEAL MANOMETRY N/A 05/02/2022   Procedure: ESOPHAGEAL MANOMETRY (EM);  Surgeon: Albertus Gordy HERO, MD;  Location: [facility];  Service: Gastroenterology;  Laterality: N/A;   ESOPHAGOGASTRODUODENOSCOPY N/A 02/28/2014   Procedure: ESOPHAGOGASTRODUODENOSCOPY (EGD);  Surgeon: Gordy HERO Albertus, MD;  Location[facility] ENDOSCOPY;  Service: Endoscopy;  Laterality: N/A;   ESOPHAGOGASTRODUODENOSCOPY N/A 08/02/2016   Procedure: ESOPHAGOGASTRODUODENOSCOPY (EGD);  Surgeon: Lupita FORBES Commander, MD;  Location: [facility] ENDOSCOPY;  Service: Endoscopy;  Laterality: N/A;   EXPLORATORY LAPAROTOMY  1987   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   FOOT SURGERY Right ~1980   don't know what they did; had to take something out   HERNIA REPAIR     INGUINAL HERNIA REPAIR Right ~ 2003   RADIOACTIVE SEED GUIDED EXCISIONAL BREAST BIOPSY Left 02/25/2018   Procedure: RADIOACTIVE SEED GUIDED EXCISIONAL BREAST BIOPSY;  Surgeon: Ebbie Cough, MD;  Location: [facility];  Service: General;  Laterality: Left;   SHOULDER OPEN ROTATOR CUFF REPAIR Left 1995   SHOULDER SURGERY Left 1997  cartilage   TONSILLECTOMY AND ADENOIDECTOMY  1976   TUBAL LIGATION  1998   laparoscopic   VAGINAL HYSTERECTOMY  04/08/2012   menorrhagia   VAGINAL PROLAPSE REPAIR  08/04/2018   Anterior and posterior with colp-mesh-Dr. Comer Ku @ [facility]   XI ROBOTIC ASSISTED HIATAL HERNIA REPAIR N/A 05/21/2022   Procedure: XI ROBOTIC ASSISTED HIATAL HERNIA REPAIR WITH TOUPET FUNDOPLICATION;  Surgeon: Signe Mitzie LABOR, MD;  Location: [facility] ORS;  Service: General;  Laterality: N/A;    Prior to Admission medications    Medication Sig Start Date End Date Taking? Authorizing Provider  acetaminophen  (TYLENOL ) 500 MG tablet Take 1,000 mg by mouth as needed for mild pain (pain score 1-3) or headache. 12/26/16  Yes [provider]  carboxymethylcellulose (REFRESH PLUS) 0.5 % SOLN Place 1 drop into both eyes 3 (three) times daily as needed (dry/irritated eyes).   Yes [provider]  cetirizine (ZYRTEC) 10 MG tablet Take 10 mg by mouth daily.   Yes [provider]  dicyclomine  (BENTYL ) 20 MG tablet Take 1 tablet (20 mg total) by mouth 3 (three) times daily as needed for spasms. 06/18/24  Yes Pyrtle, Gordy HERO, MD  diphenhydrAMINE  (BENADRYL ) 25 MG tablet Take 25 mg by mouth every 6 (six) hours as needed for allergies.   Yes [provider]  hydrochlorothiazide  (HYDRODIURIL ) 25 MG tablet TAKE 1 TABLET(25 MG) BY MOUTH DAILY Patient taking differently: Take 25 mg by mouth in the morning. 05/11/24  Yes Almarie Waddell NOVAK, NP  levothyroxine  (SYNTHROID ) 150 MCG tablet Take 1 tablet (150 mcg total) by mouth daily. Patient taking differently: Take 150 mcg by mouth daily before breakfast. 08/13/23  Yes Almarie Waddell NOVAK, NP  ondansetron  (ZOFRAN -ODT) 4 MG disintegrating tablet Take 1 tablet (4 mg total) by mouth every 8 (eight) hours as needed for nausea or vomiting. Patient taking differently: Take 4 mg by mouth every 8 (eight) hours as needed for nausea or vomiting (DISSOLVE ORALLY). 02/27/24  Yes Almarie Waddell NOVAK, NP  pantoprazole  (PROTONIX ) 40 MG tablet Take 1 tablet (40 mg total) by mouth 2 (two) times daily. Patient taking differently: Take 40 mg by mouth 2 (two) times daily before a meal. 05/13/24  Yes Charlanne Groom, MD  promethazine  (PHENERGAN ) 12.5 MG tablet Take 1 tablet (12.5 mg total) by mouth every 8 (eight) hours as needed for nausea or vomiting. 06/18/24  Yes Pyrtle, Gordy HERO, MD  sertraline  (ZOLOFT ) 100 MG tablet TAKE ONE-HALF TABLET(50 MG) BY MOUTH EVERY DAY Patient taking differently: Take 50 mg by  mouth in the morning. 05/11/24  Yes Almarie Waddell NOVAK, NP  SUMAtriptan  (IMITREX ) 100 MG tablet May repeat in 2 hours if headache persists(Plz sched with new provider) Patient taking differently: Take 100 mg by mouth daily as needed for migraine (may repeat ONCE in 2 hours, if no relief). 01/10/24  Yes Almarie Waddell NOVAK, NP    Current Facility-Administered Medications  Medication Dose Route Frequency Provider Last Rate Last Admin   0.9 %  sodium chloride  infusion   Intravenous Continuous Zehr, Jessica D, PA-C       0.9 %  sodium chloride  infusion    Continuous PRN Gabriele Zwilling M, MD 20 mL/hr at 09/01/24 1225 500 mL at 09/01/24 1225   [MAR Hold] enoxaparin  (LOVENOX ) injection 40 mg  40 mg Subcutaneous Q24H Krugh, Marissa C, DO   40 mg at 08/31/24 2105   lactated ringers  infusion   Intravenous Continuous Krugh, Marissa C, DO   Stopped at 09/01/24 9484   [  MAR Hold] morphine  (PF) 4 MG/ML injection 4 mg  4 mg Intravenous Q2H PRN Krugh, Marissa C, DO   4 mg at 09/01/24 0932   [MAR Hold] ondansetron  (ZOFRAN ) tablet 4 mg  4 mg Oral Q6H PRN Krugh, Marissa C, DO       Or   [MAR Hold] ondansetron  (ZOFRAN ) injection 4 mg  4 mg Intravenous Q6H PRN Krugh, Marissa C, DO   4 mg at 09/01/24 0931   [MAR Hold] pantoprazole  (PROTONIX ) injection 40 mg  40 mg Intravenous Q12H Krugh, Marissa C, DO   40 mg at 08/31/24 2104   [facility] Hold] piperacillin-tazobactam (ZOSYN) IVPB 3.375 g  3.375 g Intravenous Q8H Mark Bard LABOR, RPH 12.5 mL/hr at 09/01/24 9390 Infusion Verify at 09/01/24 0609   [MAR Hold] pneumococcal 20-valent conjugate vaccine (PREVNAR 20) injection 0.5 mL  0.5 mL Intramuscular Tomorrow-1000 Krugh, Marissa C, DO       polyethylene glycol powder (GLYCOLAX /MIRALAX ) container 119 g  119 g Oral Once Zehr, Jessica D, PA-C       sorbitol, magnesium  hydroxide, mineral oil, glycerin (SMOG) enema  960 mL Rectal Once Zehr, Jessica D, PA-C        Allergies as of 08/31/2024 - Review Complete 08/31/2024  Allergen Reaction  Noted   Contrast media [iodinated contrast media] Hives and Itching 03/06/2016   Other Hives, Swelling, and Other (See Comments) 03/06/2016   Codeine Nausea Only    Erythromycin Nausea And Vomiting and Other (See Comments)    Iohexol  Hives 02/12/2014   Metoclopramide  Other (See Comments) 04/06/2024   Prochlorperazine  Other (See Comments) 04/06/2024    Family History  Problem Relation Age of Onset   Hypertension Mother    Heart disease Mother    Hepatitis Mother        Hep C   Endometriosis Mother    Pancreatic disease Mother    Depression Mother    Hearing loss Mother    Hypertension Father    Vision loss Father    Alcohol abuse Father    Emphysema Father        Smoker   Hearing loss Father    Early death Father        Murdered   Lupus Sister    Endometriosis Sister    Arthritis Sister    Depression Sister    Uterine cancer Maternal Aunt        50's   Heart attack Maternal Uncle    Early death Maternal Uncle        Murdered   Breast cancer Paternal Aunt        59's   Early death Paternal Aunt    Emphysema Paternal Aunt        Non-smoker/Died of Pneumonia   Lung disease Paternal Aunt        Bronchiecstasis   Lung cancer Paternal Uncle        smoker   Lung cancer Paternal Uncle    Lung cancer Paternal Uncle    Early death Paternal Uncle        Carbon Monoxide Poisoning/Accidental   Parkinson's disease Maternal Grandmother    Alcohol abuse Maternal Grandmother    Colon cancer Maternal Grandfather    Heart disease Paternal Grandmother        Died during angioplasty surgery   Aneurysm Paternal Grandfather    Allergy (severe) Son        ASA-Idiopathic Thrombocytopenia   Pancreatic cancer Neg Hx    Esophageal cancer Neg Hx  Rectal cancer Neg Hx    Stomach cancer Neg Hx     Social History   Socioeconomic History   Marital status: Single    Spouse name: Not on file   Number of children: 2   Years of education: Not on file   Highest education level:  Associate degree: occupational, scientist, product/process development, or vocational program  Occupational History   Not on file  Tobacco Use   Smoking status: Never   Smokeless tobacco: Never  Vaping Use   Vaping status: Never Used  Substance and Sexual Activity   Alcohol use: Yes    Comment: rarely   Drug use: No   Sexual activity: Not on file  Other Topics Concern   Not on file  Social History Narrative   Works at [employer]   Social Drivers of Health   Financial Resource Strain: Medium Risk (12/19/2023)   Overall Financial Resource Strain (CARDIA)    Difficulty of Paying Living Expenses: Somewhat hard  Food Insecurity: No Food Insecurity (08/31/2024)   Hunger Vital Sign    Worried About Running Out of Food in the Last Year: Never true    Ran Out of Food in the Last Year: Never true  Transportation Needs: No Transportation Needs (08/31/2024)   PRAPARE - Administrator, Civil Service (Medical): No    Lack of Transportation (Non-Medical): No  Physical Activity: Unknown (06/20/2023)   Exercise Vital Sign    Days of Exercise per Week: 0 days    Minutes of Exercise per Session: Not on file  Stress: No Stress Concern Present (06/20/2023)   [employer] of Occupational Health - Occupational Stress Questionnaire    Feeling of Stress : Only a little  Social Connections: Socially Isolated (12/19/2023)   Social Connection and Isolation Panel    Frequency of Communication with Friends and Family: More than three times a week    Frequency of Social Gatherings with Friends and Family: Twice a week    Attends Religious Services: Never    Database Administrator or Organizations: No    Attends Engineer, Structural: Not on file    Marital Status: Divorced  Intimate Partner Violence: Not At Risk (08/31/2024)   Humiliation, Afraid, Rape, and Kick questionnaire    Fear of Current or Ex-Partner: No    Emotionally Abused: No    Physically Abused: No    Sexually Abused: No    Review of  Systems:  All other review of systems negative except as mentioned in the HPI.  Physical Exam: Vital signs BP (!) 141/62   Pulse 68   Temp 97.8 F (36.6 C) (Oral)   Resp 16   Ht 5' 6 (1.676 m)   Wt 78.5 kg   LMP 04/03/2012 Comment: Still has Cervix and Ovaries  SpO2 95%   BMI 27.92 kg/m   General:   Alert,  Well-developed, well-nourished, pleasant and cooperative in NAD Lungs:  Clear throughout to auscultation.   Heart:  Regular rate and rhythm; no murmurs, clicks, rubs,  or gallops. Abdomen:  Soft, fusilli tender-mostly in the left lower quadrant and nondistended. Normal bowel sounds.   Neuro/Psych:  Normal mood and affect. A and O x 3  Inocente Hausen, MD [facility]

## 2024-09-01 NOTE — Progress Notes (Addendum)
 Pt presented to endoscopy today for EGD with pyloric stricture dilation with MD McGreal. Post-operatively, pt c/o nausea and dry heaving. Pt coughing. MD McGreal at bedside to assess. MDA Zak notified. VO for 0.625 mg droperidol  IV.  Ozell VEAR Pouch, RN 09/01/24 1:55 PM  Addendum 1403: Droperidol  given as ordered.  Addendum 1413: Pt endorses interval improvement in nausea s/p droperidol  administration. Pt states current level of nausea is manageable. Pt's coughing has improved.  Ozell VEAR Pouch, RN 09/01/24 2:20 PM

## 2024-09-01 NOTE — Plan of Care (Signed)
  Problem: Education: Goal: Knowledge of General Education information will improve Description: Including pain rating scale, medication(s)/side effects and non-pharmacologic comfort measures Outcome: Progressing   Problem: Clinical Measurements: Goal: Ability to maintain clinical measurements within normal limits will improve Outcome: Progressing   Problem: Activity: Goal: Risk for activity intolerance will decrease Outcome: Progressing   Problem: Nutrition: Goal: Adequate nutrition will be maintained Outcome: Progressing   Problem: Elimination: Goal: Will not experience complications related to bowel motility Outcome: Progressing Goal: Will not experience complications related to urinary retention Outcome: Progressing   Problem: Pain Managment: Goal: General experience of comfort will improve and/or be controlled Outcome: Progressing

## 2024-09-01 NOTE — Op Note (Signed)
 [facility] Patient Name: Gina Wise Procedure Date: 09/01/2024 MRN: [id number] Attending MD: Inocente Hausen , MD, [id number] Date of Birth: 11-15-1960 CSN: [id number] Age: 63 Admit Type: Inpatient Procedure:                Upper GI endoscopy Indications:              Generalized abdominal pain, Nausea with vomiting,                            History of known pyloric stenosis status post                            previous balloon dilations, Awaiting future G-POEM Providers:                Inocente Hausen, MD, Ozell Pouch, Coye Bade, Technician, Leontine Galt, CRNA Referring MD:              Medicines:                General Anesthesia Complications:            No immediate complications. Estimated blood loss:                            Minimal. Estimated Blood Loss:     Estimated blood loss was minimal. Procedure:                Pre-Anesthesia Assessment:                           - Prior to the procedure, a History and Physical                            was performed, and patient medications and                            allergies were reviewed. The patient's tolerance of                            previous anesthesia was also reviewed. The risks                            and benefits of the procedure and the sedation                            options and risks were discussed with the patient.                            All questions were answered, and informed consent                            was obtained. Prior Anticoagulants: The patient has  taken no anticoagulant or antiplatelet agents. ASA                            Grade Assessment: II - A patient with mild systemic                            disease. After reviewing the risks and benefits,                            the patient was deemed in satisfactory condition to                            undergo the procedure.                            After obtaining informed consent, the endoscope was                            passed under direct vision. Throughout the                            procedure, the patient's blood pressure, pulse, and                            oxygen saturations were monitored continuously. The                            GIF-H190 ([id number]) Olympus endoscope was introduced                            through the mouth, and advanced to the second part                            of duodenum. The upper GI endoscopy was                            accomplished without difficulty. The patient                            tolerated the procedure well. Scope In: Scope Out: Findings:      The examined esophagus was normal.      Patchy mild inflammation characterized by congestion (edema), erosions       and erythema was found in the entire examined stomach. Biopsies were       taken with a cold forceps for Helicobacter pylori testing.      A small hiatal hernia was present.      Evidence of a Nissen fundoplication was found in the cardia. The wrap       appeared intact.      A benign-appearing, intrinsic moderate stenosis was found at the       pylorus. This was traversed with gentle pressure permitting passage of       standard adult upper endoscope. A TTS dilator was passed through the       scope. Dilation with a 15-16.5-18 mm pyloric balloon dilator  was       performed to a maximum diameter of 16.5 mm. The dilation site was       examined and showed moderate improvement in luminal narrowing. Status       post dilation, the pylorus was widely patent and permitted passage of       the standard adult upper endoscope without resistance.      Patchy mildly erythematous mucosa was found in the duodenal bulb.      The second portion of the duodenum was normal. Impression:               - Normal esophagus.                           - Gastritis. Biopsied.                           - Small hiatal hernia.                            - A Nissen fundoplication was found. The wrap                            appears intact.                           - Gastric stenosis was found at the pylorus.                            Dilated to 16.5 mm.                           - Erythematous duodenopathy.                           - Normal second portion of the duodenum. Moderate Sedation:      Not Applicable - Patient had care per Anesthesia. Recommendation:           - Return patient to hospital ward for ongoing care.                           - Await pathology results.                           - Monitor patient post dilation - may attempt a                            trial of clear liquids to assess tolerance. If                            clear liquids are tolerated can begin bowel                            cleanout with smog enema followed by 1/2 MiraLAX                             prep                           -  Continue anti-emetics.                           - Continue pantoprazole  40 mg IV Q12 hours.                           - The findings and recommendations were discussed                            with the patient. Procedure Code(s):        --- Professional ---                           [phone], Esophagogastroduodenoscopy, flexible,                            transoral; with dilation of gastric/duodenal                            stricture(s) (eg, balloon, bougie) Diagnosis Code(s):        --- Professional ---                           K29.70, Gastritis, unspecified, without bleeding                           K44.9, Diaphragmatic hernia without obstruction or                            gangrene                           Z98.890, Other specified postprocedural states                           K31.1, Adult hypertrophic pyloric stenosis                           K31.89, Other diseases of stomach and duodenum                           R10.84, Generalized abdominal pain                           R11.2, Nausea  with vomiting, unspecified CPT copyright 2022 American Medical Association. All rights reserved. The codes documented in this report are preliminary and upon coder review may  be revised to meet current compliance requirements. Inocente Hausen, MD 09/01/2024 2:05:41 PM This report has been signed electronically. Number of Addenda: 0

## 2024-09-01 NOTE — Hospital Course (Signed)
 Gina Wise is a 63 y.o. female with medical history significant of recurrent pyloric stenosis requiring monthly dilation pending gastric POEM procedure, Sjogren's syndrome,  hypothyroidism, HTN who presented to the ER with abdominal pain in her left lower quadrant and ongoing difficulty swallowing.  She also endorsed feeling constipated with last reported bowel movement on 10/29. Stated her last esophageal dilation was 08/06/2024. CT A/P on admission showed nonspecific sigmoid colitis appearing similar compared to April 2025 CT scan. She was started on Zosyn.  GI was consulted and she was admitted for ongoing workup and monitoring.   Assessment and Plan:  Constipation  Sigmoid colitis  - no obstruction noted on CT. Findings similar to piror CT findings.  - mild WBC on admission, afebrile but rather notable LLQ pain; defer to GI on whether to treat as diverticulitis  - continue zosyn for now - Continue IV fluids - Further bowel regimen per GI   Recurrent pyloric stenosis s/p repeat dilation (most recently 08/06/24) - continue IV Protonix   - has gastric POEM consultation appointment coming up in December at [facility] -GI following.  Tentative plan for esophageal dilation today   HTN Hypothyroidism  - resume home medications when able to tolerate PO   Hypokalemia - Replete as needed

## 2024-09-01 NOTE — Progress Notes (Signed)
   09/01/24 0848  TOC Brief Assessment  Insurance and Status Reviewed  Patient has primary care physician Yes  Home environment has been reviewed Apartment  Prior level of function: Independent  Prior/Current Home Services No current home services  Social Drivers of Health Review SDOH reviewed no interventions necessary  Readmission risk has been reviewed Yes  Transition of care needs transition of care needs identified, TOC will continue to follow    Signed: Heather Saltness, MSW, LCSW Clinical Social Worker Inpatient Care Management 09/01/2024 8:48 AM

## 2024-09-01 NOTE — Anesthesia Procedure Notes (Signed)
 Procedure Name: Intubation Date/Time: 09/01/2024 1:28 PM  Performed by: Vincenzo Show, CRNAPre-anesthesia Checklist: Patient identified, Emergency Drugs available, Suction available, Patient being monitored and Timeout performed Patient Re-evaluated:Patient Re-evaluated prior to induction Oxygen Delivery Method: Circle system utilized Preoxygenation: Pre-oxygenation with 100% oxygen Induction Type: IV induction and Cricoid Pressure applied Laryngoscope Size: Mac and 3 Grade View: Grade II Tube type: Oral Tube size: 7.0 mm Number of attempts: 1 Airway Equipment and Method: Stylet Placement Confirmation: ETT inserted through vocal cords under direct vision, positive ETCO2, CO2 detector and breath sounds checked- equal and bilateral Secured at: 21 cm Tube secured with: Tape Dental Injury: Teeth and Oropharynx as per pre-operative assessment  Comments: ATOI cords clear, no bilious fluid or solids.

## 2024-09-01 NOTE — Anesthesia Preprocedure Evaluation (Signed)
 Anesthesia Evaluation  Patient identified by MRN, date of birth, ID band Patient awake    Reviewed: Allergy & Precautions, NPO status , Patient's Chart, lab work & pertinent test results  History of Anesthesia Complications (+) PONV and history of anesthetic complications  Airway Mallampati: II  TM Distance: >3 FB Neck ROM: Full    Dental no notable dental hx. (+) Teeth Intact   Pulmonary neg pulmonary ROS, neg sleep apnea, neg COPD, Patient abstained from smoking.Not current smoker   Pulmonary exam normal breath sounds clear to auscultation       Cardiovascular Exercise Tolerance: Good METShypertension, Pt. on medications (-) CAD and (-) Past MI (-) dysrhythmias  Rhythm:Regular Rate:Normal - Systolic murmurs    Neuro/Psych  Headaches PSYCHIATRIC DISORDERS Anxiety Depression       GI/Hepatic hiatal hernia,GERD  Poorly Controlled,,(+)     (-) substance abuse  Patient last vomited yesterday when trying to take liquids.   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 No date: Dyspnea     Comment:  from scar tissue 12/01/2015: Epistaxis No date: Gastritis No date: GERD (gastroesophageal reflux disease) 08/01/2016: Hematemesis with nausea No date: Hiatal hernia No date: High cholesterol No date: Hypertension No date: Hypothyroid No date: Internal hemorrhoids No  date: Migraines     Comment:  maybe a couple/yr (02/17/2014) No date: Multiple environmental allergies No date: Overweight(278.02) No date: Peri-menopause No date: PONV (postoperative nausea and vomiting) No date: Pre-diabetes No date: Schatzki's ring No date: Sjogren's disease No date: Systemic lupus (HCC)  Reproductive/Obstetrics                              Anesthesia Physical Anesthesia Plan  ASA: 2  Anesthesia Plan: General   Post-op Pain Management:    Induction: Intravenous and Rapid sequence  PONV Risk Score and Plan: 4 or greater and Ondansetron  and Dexamethasone   Airway Management Planned: Oral ETT  Additional Equipment: None  Intra-op Plan:   Post-operative Plan: Extubation in OR  Informed Consent: I have reviewed the patients History and Physical, chart, labs and discussed the procedure including the risks, benefits and alternatives for the proposed anesthesia with the patient or authorized representative who has indicated his/her understanding and acceptance.     Dental advisory given  Plan Discussed with: CRNA and Surgeon  Anesthesia Plan Comments: (Discussed risks of anesthesia with patient, including PONV, sore throat, lip/dental/eye damage, aspiration. Rare risks discussed as well, such as cardiorespiratory and neurological sequelae, and allergic reactions. Discussed the role of CRNA in patient's perioperative care. Patient understands.)        Anesthesia Quick Evaluation

## 2024-09-01 NOTE — Progress Notes (Signed)
 Progress Note    Gina Wise   FMW:[id number]  DOB: [birth year]  DOA: 08/31/2024     1 PCP: Almarie Waddell NOVAK, NP  Initial CC: Abdominal pain, difficulty swallowing  Hospital Course: Gina Wise is a 63 y.o. female with medical history significant of recurrent pyloric stenosis requiring monthly dilation pending gastric POEM procedure, Sjogren's syndrome,  hypothyroidism, HTN who presented to the ER with abdominal pain in her left lower quadrant and ongoing difficulty swallowing.  She also endorsed feeling constipated with last reported bowel movement on 10/29. Stated her last esophageal dilation was 08/06/2024. CT A/P on admission showed nonspecific sigmoid colitis appearing similar compared to April 2025 CT scan. She was started on Zosyn.  GI was consulted and she was admitted for ongoing workup and monitoring.   Assessment and Plan:  Constipation  Sigmoid colitis  - no obstruction noted on CT. Findings similar to piror CT findings.  - mild WBC on admission, afebrile but rather notable LLQ pain; defer to GI on whether to treat as diverticulitis  - continue zosyn for now - Continue IV fluids - Further bowel regimen per GI   Recurrent pyloric stenosis s/p repeat dilation (most recently 08/06/24) - continue IV Protonix   - has gastric POEM consultation appointment coming up in December at [facility] -GI following.  Tentative plan for esophageal dilation today   HTN Hypothyroidism  - resume home medications when able to tolerate PO   Hypokalemia - Replete as needed  Interval History:  No events overnight.  Still having ongoing left lower quadrant pain which is concerning her along with difficulty swallowing.  Essentially due for repeat dilation since last month. Also has upcoming appointment with [facility] for consultation regarding the gastric POEM. Also concerned about her constipation, last bowel movement 10/29.    Antimicrobials: Zosyn 08/31/2024 >> current  DVT prophylaxis:   enoxaparin  (LOVENOX ) injection 40 mg Start: 08/31/24 2200   Code Status:   Code Status: Full Code  Mobility Assessment (Last 72 Hours)     Mobility Assessment     Row Name 08/31/24 21:00:50           Does the patient have exclusion criteria? No - Perform mobility assessment       What is the highest level of mobility based on the mobility assessment? Level 5 (Ambulates independently) - Balance while walking independently - Complete          Diet: Diet Orders (From admission, onward)     Start     Ordered   08/31/24 2001  Diet NPO time specified  Diet effective now        08/31/24 2001            Barriers to discharge: None Disposition Plan: Home HH orders placed: N/A Status is: Inpatient  Objective: Blood pressure (!) 141/62, pulse 68, temperature 97.8 F (36.6 C), temperature source Oral, resp. rate 16, height 5' 6 (1.676 m), weight 78.5 kg, last menstrual period 04/03/2012, SpO2 95%.  Examination:  Physical Exam Constitutional:      Appearance: Normal appearance.  HENT:     Head: Normocephalic and atraumatic.     Mouth/Throat:     Mouth: Mucous membranes are moist.  Eyes:     Extraocular Movements: Extraocular movements intact.  Cardiovascular:     Rate and Rhythm: Normal rate and regular rhythm.  Pulmonary:     Effort: Pulmonary effort is normal. No respiratory distress.     Breath sounds: Normal breath sounds. No  wheezing.  Abdominal:     General: Bowel sounds are normal. There is no distension.     Palpations: Abdomen is soft.     Tenderness: There is abdominal tenderness in the left lower quadrant.  Musculoskeletal:        General: Normal range of motion.     Cervical back: Normal range of motion and neck supple.  Skin:    General: Skin is warm and dry.  Neurological:     General: No focal deficit present.     Mental Status: She is alert.  Psychiatric:        Mood and Affect: Mood normal.      Consultants:  GI  Procedures:    Data  Reviewed: Results for orders placed or performed during the hospital encounter of 08/31/24 (from the past 24 hours)  Comprehensive metabolic panel     Status: Abnormal   Collection Time: 08/31/24  1:44 PM  Result Value Ref Range   Sodium 137 135 - 145 mmol/L   Potassium 3.6 3.5 - 5.1 mmol/L   Chloride 99 98 - 111 mmol/L   CO2 21 (L) 22 - 32 mmol/L   Glucose, Bld 106 (H) 70 - 99 mg/dL   BUN 21 8 - 23 mg/dL   Creatinine, Ser 9.09 0.44 - 1.00 mg/dL   Calcium  10.0 8.9 - 10.3 mg/dL   Total Protein 8.6 (H) 6.5 - 8.1 g/dL   Albumin  4.8 3.5 - 5.0 g/dL   AST 21 15 - 41 U/L   ALT 7 0 - 44 U/L   Alkaline Phosphatase 72 38 - 126 U/L   Total Bilirubin 0.6 0.0 - 1.2 mg/dL   GFR, Estimated >39 >39 mL/min   Anion gap 17 (H) 5 - 15  Lipase, blood     Status: None   Collection Time: 08/31/24  1:44 PM  Result Value Ref Range   Lipase 39 11 - 51 U/L  CBC with Diff     Status: Abnormal   Collection Time: 08/31/24  1:44 PM  Result Value Ref Range   WBC 13.4 (H) 4.0 - 10.5 K/uL   RBC 5.19 (H) 3.87 - 5.11 MIL/uL   Hemoglobin 15.1 (H) 12.0 - 15.0 g/dL   HCT 54.6 63.9 - 53.9 %   MCV 87.3 80.0 - 100.0 fL   MCH 29.1 26.0 - 34.0 pg   MCHC 33.3 30.0 - 36.0 g/dL   RDW 86.6 88.4 - 84.4 %   Platelets 411 (H) 150 - 400 K/uL   nRBC 0.0 0.0 - 0.2 %   Neutrophils Relative % 71 %   Neutro Abs 9.4 (H) 1.7 - 7.7 K/uL   Lymphocytes Relative 20 %   Lymphs Abs 2.7 0.7 - 4.0 K/uL   Monocytes Relative 7 %   Monocytes Absolute 1.0 0.1 - 1.0 K/uL   Eosinophils Relative 2 %   Eosinophils Absolute 0.2 0.0 - 0.5 K/uL   Basophils Relative 0 %   Basophils Absolute 0.1 0.0 - 0.1 K/uL   Immature Granulocytes 0 %   Abs Immature Granulocytes 0.05 0.00 - 0.07 K/uL  Urinalysis, Routine w reflex microscopic -Urine, Clean Catch     Status: Abnormal   Collection Time: 08/31/24  1:51 PM  Result Value Ref Range   Color, Urine YELLOW YELLOW   APPearance CLEAR CLEAR   Specific Gravity, Urine 1.023 1.005 - 1.030   pH 7.0 5.0  - 8.0   Glucose, UA NEGATIVE NEGATIVE mg/dL   Hgb urine dipstick  NEGATIVE NEGATIVE   Bilirubin Urine NEGATIVE NEGATIVE   Ketones, ur 5 (A) NEGATIVE mg/dL   Protein, ur NEGATIVE NEGATIVE mg/dL   Nitrite NEGATIVE NEGATIVE   Leukocytes,Ua NEGATIVE NEGATIVE   RBC / HPF 11-20 0 - 5 RBC/hpf   WBC, UA 0-5 0 - 5 WBC/hpf   Bacteria, UA NONE SEEN NONE SEEN   Squamous Epithelial / HPF 0-5 0 - 5 /HPF   Mucus PRESENT    Hyaline Casts, UA PRESENT   I-Stat CG4 Lactic Acid     Status: None   Collection Time: 08/31/24  4:20 PM  Result Value Ref Range   Lactic Acid, Venous 1.1 0.5 - 1.9 mmol/L  HIV Antibody (routine testing w rflx)     Status: None   Collection Time: 08/31/24  9:11 PM  Result Value Ref Range   HIV Screen 4th Generation wRfx Non Reactive Non Reactive  CBC     Status: None   Collection Time: 08/31/24  9:11 PM  Result Value Ref Range   WBC 10.5 4.0 - 10.5 K/uL   RBC 4.64 3.87 - 5.11 MIL/uL   Hemoglobin 13.7 12.0 - 15.0 g/dL   HCT 58.3 63.9 - 53.9 %   MCV 89.7 80.0 - 100.0 fL   MCH 29.5 26.0 - 34.0 pg   MCHC 32.9 30.0 - 36.0 g/dL   RDW 86.5 88.4 - 84.4 %   Platelets 338 150 - 400 K/uL   nRBC 0.0 0.0 - 0.2 %  Creatinine, serum     Status: None   Collection Time: 08/31/24  9:11 PM  Result Value Ref Range   Creatinine, Ser 0.84 0.44 - 1.00 mg/dL   GFR, Estimated >39 >39 mL/min  C-reactive protein     Status: None   Collection Time: 08/31/24  9:11 PM  Result Value Ref Range   CRP <0.5 <1.0 mg/dL  CBC     Status: None   Collection Time: 09/01/24  6:27 AM  Result Value Ref Range   WBC 10.5 4.0 - 10.5 K/uL   RBC 4.43 3.87 - 5.11 MIL/uL   Hemoglobin 13.1 12.0 - 15.0 g/dL   HCT 60.3 63.9 - 53.9 %   MCV 89.4 80.0 - 100.0 fL   MCH 29.6 26.0 - 34.0 pg   MCHC 33.1 30.0 - 36.0 g/dL   RDW 86.4 88.4 - 84.4 %   Platelets 298 150 - 400 K/uL   nRBC 0.0 0.0 - 0.2 %  Basic metabolic panel     Status: Abnormal   Collection Time: 09/01/24  6:27 AM  Result Value Ref Range   Sodium 138  135 - 145 mmol/L   Potassium 3.3 (L) 3.5 - 5.1 mmol/L   Chloride 102 98 - 111 mmol/L   CO2 25 22 - 32 mmol/L   Glucose, Bld 93 70 - 99 mg/dL   BUN 16 8 - 23 mg/dL   Creatinine, Ser 9.12 0.44 - 1.00 mg/dL   Calcium  9.0 8.9 - 10.3 mg/dL   GFR, Estimated >39 >39 mL/min   Anion gap 11 5 - 15   *Note: Due to a large number of results and/or encounters for the requested time period, some results have not been displayed. A complete set of results can be found in Results Review.    I have reviewed pertinent nursing notes, vitals, labs, and images as necessary. I have ordered labwork to follow up on as indicated.  I have reviewed the last notes from staff over past 24 hours.  I have discussed patient's care plan and test results with nursing staff, CM/SW, and other staff as appropriate.  Old records reviewed in assessment of this patient  Time spent: Greater than 50% of the 55 minute visit was spent in counseling/coordination of care for the patient as laid out in the A&P.   LOS: 1 day   Alm Apo, MD [facility] 09/01/2024, 12:33 PM

## 2024-09-01 NOTE — Anesthesia Postprocedure Evaluation (Signed)
 Anesthesia Post Note  Patient: Gina Wise  Procedure(s) Performed: EGD (ESOPHAGOGASTRODUODENOSCOPY) Balloon dilation wire-guided     Patient location during evaluation: Endoscopy Anesthesia Type: General Level of consciousness: awake and alert Pain management: pain level controlled Vital Signs Assessment: post-procedure vital signs reviewed and stable Respiratory status: spontaneous breathing, nonlabored ventilation, respiratory function stable and patient connected to nasal cannula oxygen Cardiovascular status: blood pressure returned to baseline and stable Postop Assessment: no apparent nausea or vomiting Anesthetic complications: no   No notable events documented.  Last Vitals:  Vitals:   09/01/24 1350 09/01/24 1400  BP: 132/64 (!) 151/66  Pulse: (!) 118 (!) 103  Resp: 19 18  Temp:    SpO2: 94% 93%    Last Pain:  Vitals:   09/01/24 1400  TempSrc:   PainSc: 6                  Rome Ade

## 2024-09-02 ENCOUNTER — Encounter (HOSPITAL_COMMUNITY): Payer: Self-pay | Admitting: Pediatrics

## 2024-09-02 DIAGNOSIS — K529 Noninfective gastroenteritis and colitis, unspecified: Secondary | ICD-10-CM | POA: Diagnosis not present

## 2024-09-02 LAB — BASIC METABOLIC PANEL WITH GFR
Anion gap: 11 (ref 5–15)
BUN: 11 mg/dL (ref 8–23)
CO2: 23 mmol/L (ref 22–32)
Calcium: 9.1 mg/dL (ref 8.9–10.3)
Chloride: 103 mmol/L (ref 98–111)
Creatinine, Ser: 0.68 mg/dL (ref 0.44–1.00)
GFR, Estimated: >60 mL/min (ref >60)
Glucose, Bld: 102 mg/dL — ABNORMAL HIGH (ref 70–99)
Potassium: 3.8 mmol/L (ref 3.5–5.1)
Sodium: 137 mmol/L (ref 135–145)

## 2024-09-02 LAB — CBC WITH DIFFERENTIAL/PLATELET
Abs Immature Granulocytes: 0.09 K/uL — ABNORMAL HIGH (ref 0.00–0.07)
Basophils Absolute: 0 K/uL (ref 0.0–0.1)
Basophils Relative: 0 %
Eosinophils Absolute: 0 K/uL (ref 0.0–0.5)
Eosinophils Relative: 0 %
HCT: 37.5 % (ref 36.0–46.0)
Hemoglobin: 12.6 g/dL (ref 12.0–15.0)
Immature Granulocytes: 1 %
Lymphocytes Relative: 10 %
Lymphs Abs: 1.2 K/uL (ref 0.7–4.0)
MCH: 29.5 pg (ref 26.0–34.0)
MCHC: 33.6 g/dL (ref 30.0–36.0)
MCV: 87.8 fL (ref 80.0–100.0)
Monocytes Absolute: 0.6 K/uL (ref 0.1–1.0)
Monocytes Relative: 5 %
Neutro Abs: 10.2 K/uL — ABNORMAL HIGH (ref 1.7–7.7)
Neutrophils Relative %: 84 %
Platelets: 306 K/uL (ref 150–400)
RBC: 4.27 MIL/uL (ref 3.87–5.11)
RDW: 13.2 % (ref 11.5–15.5)
WBC: 12.1 K/uL — ABNORMAL HIGH (ref 4.0–10.5)
nRBC: 0 % (ref 0.0–0.2)

## 2024-09-02 LAB — SURGICAL PATHOLOGY

## 2024-09-02 LAB — MAGNESIUM: Magnesium: 2.5 mg/dL — ABNORMAL HIGH (ref 1.7–2.4)

## 2024-09-02 MED ORDER — POLYVINYL ALCOHOL 1.4 % OP SOLN
1.0000 [drp] | OPHTHALMIC | Status: DC | PRN
  Administered 2024-09-02: 1 [drp] via OPHTHALMIC
  Filled 2024-09-02: qty 15

## 2024-09-02 MED ORDER — MELATONIN 5 MG PO TABS
5.0000 mg | ORAL_TABLET | Freq: Every evening | ORAL | Status: DC | PRN
  Administered 2024-09-02 – 2024-09-03 (×2): 5 mg via ORAL
  Filled 2024-09-02 (×2): qty 1

## 2024-09-02 MED ORDER — DICYCLOMINE HCL 10 MG PO CAPS
20.0000 mg | ORAL_CAPSULE | Freq: Three times a day (TID) | ORAL | Status: DC
  Administered 2024-09-02 – 2024-09-04 (×7): 20 mg via ORAL
  Filled 2024-09-02 (×8): qty 2

## 2024-09-02 NOTE — Progress Notes (Signed)
 [facility] Progress Note  CC:  Colitis/constipation, dysphagia  Subjective:  Says that it was a rough morning because she has been having several bowel movements.  Abdominal pain is better.  PO tolerance good with clears.  Asking when we can advance diet and when she may be going home.  Objective:  Vital signs in last 24 hours: Temp:  [97.5 F (36.4 C)-98.1 F (36.7 C)] 97.5 F (36.4 C) (11/12 0553) Pulse Rate:  [65-118] 73 (11/12 0553) Resp:  [15-21] 18 (11/12 0553) BP: (124-158)/(55-83) 135/78 (11/12 0553) SpO2:  [93 %-100 %] 95 % (11/12 0553) Last BM Date : 09/01/24 General:  Alert, Well-developed, in NAD Heart:  Regular rate and rhythm; no murmurs Pulm:  CTAB. No W/R/R. Abdomen:  Soft, non-distended.  BS present.  Mild TTP on the left. Extremities:  Without edema. Neurologic:  Alert and oriented x 4;  grossly normal neurologically. Psych:  Alert and cooperative. Normal mood and affect.  Intake/Output from previous day: 11/11 0701 - 11/12 0700 In: 1212.4 [P.O.:236; I.V.:585.2; IV Piggyback:391.3] Out: -   Lab Results: Recent Labs    08/31/24 2111 09/01/24 0627 09/02/24 0557  WBC 10.5 10.5 12.1*  HGB 13.7 13.1 12.6  HCT 41.6 39.6 37.5  PLT 338 298 306   BMET Recent Labs    08/31/24 1344 08/31/24 2111 09/01/24 0627 09/02/24 0557  NA 137  --  138 137  K 3.6  --  3.3* 3.8  CL 99  --  102 103  CO2 21*  --  25 23  GLUCOSE 106*  --  93 102*  BUN 21  --  16 11  CREATININE 0.90 0.84 0.87 0.68  CALCIUM  10.0  --  9.0 9.1   LFT Recent Labs    08/31/24 1344  PROT 8.6*  ALBUMIN  4.8  AST 21  ALT 7  ALKPHOS 72  BILITOT 0.6   CT ABDOMEN PELVIS WO CONTRAST Result Date: 08/31/2024 EXAM: CT ABDOMEN AND PELVIS WITHOUT CONTRAST 08/31/2024 04:33:48 PM TECHNIQUE: CT of the abdomen and pelvis was performed without the administration of intravenous contrast. Multiplanar reformatted images are provided for review. Automated exposure control,  iterative reconstruction, and/or weight-based adjustment of the mA/kV was utilized to reduce the radiation dose to as low as reasonably achievable. COMPARISON: 01/31/2024 CLINICAL HISTORY: Abdominal pain, acute, nonlocalized; Bowel obstruction suspected. FINDINGS: LOWER CHEST: No acute abnormality. LIVER: The liver is unremarkable. GALLBLADDER AND BILE DUCTS: Gallbladder is unremarkable. No biliary ductal dilatation. SPLEEN: No acute abnormality. PANCREAS: No acute abnormality. ADRENAL GLANDS: No acute abnormality. KIDNEYS, URETERS AND BLADDER: No stones in the kidneys or ureters. No hydronephrosis. No perinephric or periureteral stranding. Urinary bladder is unremarkable. GI AND BOWEL: Stomach demonstrates no acute abnormality. Small duodenal diverticulum. Dense stool in the sigmoid colon with associated bowel wall thickening and trace adjacent stranding. There is no bowel obstruction. PERITONEUM AND RETROPERITONEUM: No absence. No free air. VASCULATURE: Aorta is normal in caliber. Aortic atherosclerotic calcification. LYMPH NODES: No lymphadenopathy. REPRODUCTIVE ORGANS: No acute abnormality. BONES AND SOFT TISSUES: No acute osseous abnormality. No focal soft tissue abnormality. IMPRESSION: 1. Nonspecific sigmoid colitis, likely infectious or inflammatory and similar to 4 / few 11 / 25 . Electronically signed by: Norman Gatlin MD 08/31/2024 04:58 PM EST RP Workstation: [id number]   DG Abd Portable 2 Views Result Date: 08/31/2024 CLINICAL DATA:  Abdominal pain for 2 weeks.  Constipation. EXAM: PORTABLE ABDOMEN - 2 VIEW COMPARISON:  07/19/2022. FINDINGS: Stool is seen in the majority of  the colon. Scattered colonic air-fluid levels. No small bowel dilatation. No unexpected radiopaque calculi. Visualized lung bases are clear. IMPRESSION: Stool is seen in the majority of the colon, compatible with the provided history of constipation. Electronically Signed   By: Newell Eke M.D.   On: 08/31/2024 14:30    Assessment / Plan: *Constipation with associated inflammatory changes in the sigmoid colon, suspicious for stercoral colitis.  Got SMOG enema and half miralax  prep and has been having several bowel movements today. *Pyloric stenosis status post dilatation.  Awaiting G-POEM at [facility] in December.  Having recurrent dysphagia/issues with tolerating PO.  S/p EGD with dilation of the pylorus to 16.5 mm with some improvement.  -Will advance to full liquid diet. -Added bentyl  20 mg TID for cramping (she typically takes it prn at home as well). -Can discontinue Zosyn. -Await biopsy results from EGD. -Should continue miralax  daily at home to prevent recurrence of this issue and consider Benefiber as well to help with bulking of the stool so that may help them from getting too loose.     LOS: 2 days   Gina Wise. [name]  09/02/2024, 9:55 AM

## 2024-09-02 NOTE — Progress Notes (Signed)
 Progress Note    Gina Wise   FMW:[id number]  DOB: 08-Feb-1961  DOA: 08/31/2024     2 PCP: Almarie Waddell NOVAK, NP  Initial CC: Abdominal pain, difficulty swallowing  Hospital Course: Gina Wise is a 63 y.o. female with medical history significant of recurrent pyloric stenosis requiring monthly dilation pending gastric POEM procedure, Sjogren's syndrome,  hypothyroidism, HTN who presented to the ER with abdominal pain in her left lower quadrant and ongoing difficulty swallowing.  She also endorsed feeling constipated with last reported bowel movement on 10/29. Stated her last esophageal dilation was 08/06/2024. CT A/P on admission showed nonspecific sigmoid colitis appearing similar compared to April 2025 CT scan. She was started on Zosyn.  GI was consulted and she was admitted for ongoing workup and monitoring.   Assessment and Plan:  Constipation  Sigmoid colitis  - no obstruction noted on CT. Findings similar to piror CT findings.  - mild WBC on admission, afebrile but rather notable LLQ pain; defer to GI on whether to treat as diverticulitis  - continue zosyn for now - Continue IV fluids - Further bowel regimen per GI   Recurrent pyloric stenosis s/p repeat dilation (most recently 08/06/24) - continue IV Protonix   - has gastric POEM consultation appointment coming up in December at [facility] -GI following.  Status post esophageal dilation 11/11 HTN Hypothyroidism  - resume home medications when able to tolerate PO   Hypokalemia - Replete as needed  Interval History:  Seen at bedside this morning, needs to have generalized abdominal pain.  She had esophageal dilation on 11/11.  She is requesting to go on a full liquid diet.   States that she has an upcoming appointment with [facility] for consultation regarding the gastric POEM.  Antimicrobials: Zosyn 08/31/2024 >> current  DVT prophylaxis:  enoxaparin  (LOVENOX ) injection 40 mg Start: 08/31/24 2200   Code Status:   Code  Status: Full Code  Mobility Assessment (Last 72 Hours)     Mobility Assessment     Row Name 09/01/24 2000 09/01/24 1942 09/01/24 1700 08/31/24 21:00:50     Does the patient have exclusion criteria? No - Perform mobility assessment No - Perform mobility assessment No - Perform mobility assessment No - Perform mobility assessment    What is the highest level of mobility based on the mobility assessment? Level 5 (Ambulates independently) - Balance while walking independently - Complete Level 5 (Ambulates independently) - Balance while walking independently - Complete Level 5 (Ambulates independently) - Balance while walking independently - Complete Level 5 (Ambulates independently) - Balance while walking independently - Complete       Diet: Diet Orders (From admission, onward)     Start     Ordered   09/02/24 1043  Diet full liquid Fluid consistency: Thin  Diet effective now       Question:  Fluid consistency:  Answer:  Thin   09/02/24 1042            Barriers to discharge: None Disposition Plan: Home HH orders placed: N/A Status is: Inpatient  Objective: Blood pressure 135/78, pulse 73, temperature (!) 97.5 F (36.4 C), temperature source Oral, resp. rate 18, height 5' 6 (1.676 m), weight 78.5 kg, last menstrual period 04/03/2012, SpO2 95%.  Examination:  Physical Exam Constitutional:      Appearance: Normal appearance.  HENT:     Head: Normocephalic and atraumatic.     Mouth/Throat:     Mouth: Mucous membranes are moist.  Eyes:  Extraocular Movements: Extraocular movements intact.  Cardiovascular:     Rate and Rhythm: Normal rate and regular rhythm.  Pulmonary:     Effort: Pulmonary effort is normal. No respiratory distress.     Breath sounds: Normal breath sounds. No wheezing.  Abdominal:     General: Bowel sounds are normal. There is no distension.     Palpations: Abdomen is soft.     Tenderness: There is generalized abdominal tenderness and tenderness in  the left lower quadrant.  Musculoskeletal:        General: Normal range of motion.     Cervical back: Normal range of motion and neck supple.  Skin:    General: Skin is warm and dry.  Neurological:     General: No focal deficit present.     Mental Status: She is alert.  Psychiatric:        Mood and Affect: Mood normal.      Consultants:  GI  Procedures:    Data Reviewed: Results for orders placed or performed during the hospital encounter of 08/31/24 (from the past 24 hours)  Basic metabolic panel with GFR     Status: Abnormal   Collection Time: 09/02/24  5:57 AM  Result Value Ref Range   Sodium 137 135 - 145 mmol/L   Potassium 3.8 3.5 - 5.1 mmol/L   Chloride 103 98 - 111 mmol/L   CO2 23 22 - 32 mmol/L   Glucose, Bld 102 (H) 70 - 99 mg/dL   BUN 11 8 - 23 mg/dL   Creatinine, Ser 9.31 0.44 - 1.00 mg/dL   Calcium  9.1 8.9 - 10.3 mg/dL   GFR, Estimated >39 >39 mL/min   Anion gap 11 5 - 15  CBC with Differential/Platelet     Status: Abnormal   Collection Time: 09/02/24  5:57 AM  Result Value Ref Range   WBC 12.1 (H) 4.0 - 10.5 K/uL   RBC 4.27 3.87 - 5.11 MIL/uL   Hemoglobin 12.6 12.0 - 15.0 g/dL   HCT 62.4 63.9 - 53.9 %   MCV 87.8 80.0 - 100.0 fL   MCH 29.5 26.0 - 34.0 pg   MCHC 33.6 30.0 - 36.0 g/dL   RDW 86.7 88.4 - 84.4 %   Platelets 306 150 - 400 K/uL   nRBC 0.0 0.0 - 0.2 %   Neutrophils Relative % 84 %   Neutro Abs 10.2 (H) 1.7 - 7.7 K/uL   Lymphocytes Relative 10 %   Lymphs Abs 1.2 0.7 - 4.0 K/uL   Monocytes Relative 5 %   Monocytes Absolute 0.6 0.1 - 1.0 K/uL   Eosinophils Relative 0 %   Eosinophils Absolute 0.0 0.0 - 0.5 K/uL   Basophils Relative 0 %   Basophils Absolute 0.0 0.0 - 0.1 K/uL   Immature Granulocytes 1 %   Abs Immature Granulocytes 0.09 (H) 0.00 - 0.07 K/uL  Magnesium      Status: Abnormal   Collection Time: 09/02/24  5:57 AM  Result Value Ref Range   Magnesium  2.5 (H) 1.7 - 2.4 mg/dL   *Note: Due to a large number of results and/or  encounters for the requested time period, some results have not been displayed. A complete set of results can be found in Results Review.    I have reviewed pertinent nursing notes, vitals, labs, and images as necessary. I have ordered labwork to follow up on as indicated.  I have reviewed the last notes from staff over past 24 hours. I have discussed  patient's care plan and test results with nursing staff, CM/SW, and other staff as appropriate.  Old records reviewed in assessment of this patient  Time spent: Greater than 50% of the 55 minute visit was spent in counseling/coordination of care for the patient as laid out in the A&P.   LOS: 2 days   Landon FORBES Baller, MD [facility] 09/02/2024, 11:35 AM

## 2024-09-02 NOTE — Discharge Instructions (Signed)
 Take miralax  one capful in 8 ounces of liquid daily.  Also consider Benefiber powder, 2 teaspoons mixed in 6-8 ounces of liquid daily to help with bulking the stools.

## 2024-09-02 NOTE — Plan of Care (Signed)
  Problem: Health Behavior/Discharge Planning: Goal: Ability to manage health-related needs will improve Outcome: Progressing   Problem: Nutrition: Goal: Adequate nutrition will be maintained Outcome: Progressing   Problem: Elimination: Goal: Will not experience complications related to bowel motility Outcome: Progressing   

## 2024-09-03 ENCOUNTER — Ambulatory Visit: Payer: Self-pay | Admitting: Pediatrics

## 2024-09-03 DIAGNOSIS — R110 Nausea: Secondary | ICD-10-CM

## 2024-09-03 LAB — CBC WITH DIFFERENTIAL/PLATELET
Abs Immature Granulocytes: 0.04 K/uL (ref 0.00–0.07)
Basophils Absolute: 0.1 K/uL (ref 0.0–0.1)
Basophils Relative: 1 %
Eosinophils Absolute: 0.2 K/uL (ref 0.0–0.5)
Eosinophils Relative: 2 %
HCT: 37.9 % (ref 36.0–46.0)
Hemoglobin: 12.6 g/dL (ref 12.0–15.0)
Immature Granulocytes: 0 %
Lymphocytes Relative: 24 %
Lymphs Abs: 2.4 K/uL (ref 0.7–4.0)
MCH: 29.1 pg (ref 26.0–34.0)
MCHC: 33.2 g/dL (ref 30.0–36.0)
MCV: 87.5 fL (ref 80.0–100.0)
Monocytes Absolute: 0.7 K/uL (ref 0.1–1.0)
Monocytes Relative: 7 %
Neutro Abs: 6.5 K/uL (ref 1.7–7.7)
Neutrophils Relative %: 66 %
Platelets: 281 K/uL (ref 150–400)
RBC: 4.33 MIL/uL (ref 3.87–5.11)
RDW: 13.2 % (ref 11.5–15.5)
WBC: 9.8 K/uL (ref 4.0–10.5)
nRBC: 0 % (ref 0.0–0.2)

## 2024-09-03 LAB — MAGNESIUM: Magnesium: 2.5 mg/dL — ABNORMAL HIGH (ref 1.7–2.4)

## 2024-09-03 LAB — BASIC METABOLIC PANEL WITH GFR
Anion gap: 11 (ref 5–15)
BUN: 12 mg/dL (ref 8–23)
CO2: 26 mmol/L (ref 22–32)
Calcium: 9 mg/dL (ref 8.9–10.3)
Chloride: 103 mmol/L (ref 98–111)
Creatinine, Ser: 0.82 mg/dL (ref 0.44–1.00)
GFR, Estimated: >60 mL/min (ref >60)
Glucose, Bld: 72 mg/dL (ref 70–99)
Potassium: 3.4 mmol/L — ABNORMAL LOW (ref 3.5–5.1)
Sodium: 140 mmol/L (ref 135–145)

## 2024-09-03 LAB — GLUCOSE, CAPILLARY: Glucose-Capillary: 80 mg/dL (ref 70–99)

## 2024-09-03 MED ORDER — OXYCODONE-ACETAMINOPHEN 5-325 MG PO TABS: 1.0000 | ORAL_TABLET | ORAL | Status: DC | PRN

## 2024-09-03 MED ORDER — PROMETHAZINE HCL 25 MG PO TABS
12.5000 mg | ORAL_TABLET | Freq: Four times a day (QID) | ORAL | Status: DC | PRN
  Administered 2024-09-03 – 2024-09-04 (×3): 12.5 mg via ORAL
  Filled 2024-09-03 (×3): qty 1

## 2024-09-03 MED ORDER — SCOPOLAMINE 1 MG/3DAYS TD PT72
1.0000 | MEDICATED_PATCH | TRANSDERMAL | Status: DC
  Administered 2024-09-03: 1 mg via TRANSDERMAL
  Filled 2024-09-03: qty 1

## 2024-09-03 MED ORDER — POTASSIUM CHLORIDE 10 MEQ/100ML IV SOLN
10.0000 meq | Freq: Once | INTRAVENOUS | Status: AC
  Administered 2024-09-03: 10 meq via INTRAVENOUS
  Filled 2024-09-03: qty 100

## 2024-09-03 MED ORDER — POLYETHYLENE GLYCOL 3350 17 G PO PACK
17.0000 g | PACK | Freq: Every day | ORAL | Status: DC
  Filled 2024-09-03: qty 1

## 2024-09-03 MED ORDER — POTASSIUM CHLORIDE 20 MEQ PO PACK
20.0000 meq | PACK | Freq: Two times a day (BID) | ORAL | Status: AC
  Administered 2024-09-03: 20 meq via ORAL
  Filled 2024-09-03: qty 1

## 2024-09-03 NOTE — Progress Notes (Signed)
 I attest to student documentation.  Brek Reece V. Tashaun Obey, MSN-RN Nursing Faculty/Clinical Instructor [facility] Associate Degree Nursing Program

## 2024-09-03 NOTE — Progress Notes (Signed)
 Progress Note    Gina Wise   FMW:[id number]  DOB: 1961/01/23  DOA: 08/31/2024     3 PCP: Almarie Waddell NOVAK, NP  Initial CC: Abdominal pain, difficulty swallowing  Hospital Course: Gina Wise is a 63 y.o. female with medical history significant of recurrent pyloric stenosis requiring monthly dilation pending gastric POEM procedure, Sjogren's syndrome,  hypothyroidism, HTN who presented to the ER with abdominal pain in her left lower quadrant and ongoing difficulty swallowing.  She also endorsed feeling constipated with last reported bowel movement on 10/29. Stated her last esophageal dilation was 08/06/2024. CT A/P on admission showed nonspecific sigmoid colitis appearing similar compared to April 2025 CT scan. She was started on Zosyn.  GI was consulted and she was admitted for ongoing workup and monitoring.   Assessment and Plan:  Constipation  Sigmoid colitis  - no obstruction noted on CT. Findings similar to piror CT findings.  - mild WBC on admission, afebrile but rather notable LLQ pain; pain has improved but bloated and gassy with occasional cramps. - discontinued zosyn, WBC resolved - Continue IV fluids - Further bowel regimen per GI   Recurrent pyloric stenosis s/p repeat dilation (most recently 08/06/24) - continue IV Protonix   - has gastric POEM consultation appointment coming up in December at [facility] -GI following.  Status post esophageal dilation 11/11 HTN Hypothyroidism  - resume home medications when able to tolerate PO   Hypokalemia - Replete as needed  Interval History:  Seen at bedside this morning, Abd pain improved with bentyl , Had BM this morning. Still feels gassy and bloated  States that she has an upcoming appointment with [facility] for consultation regarding the gastric POEM.  Antimicrobials: Zosyn 08/31/2024 >> current  DVT prophylaxis:  enoxaparin  (LOVENOX ) injection 40 mg Start: 08/31/24 2200   Code Status:   Code Status: Full  Code  Mobility Assessment (Last 72 Hours)     Mobility Assessment     Row Name 09/03/24 0733 09/02/24 1959 09/02/24 1100 09/01/24 2000 09/01/24 1942   Does the patient have exclusion criteria? No - Perform mobility assessment No - Perform mobility assessment No - Perform mobility assessment No - Perform mobility assessment No - Perform mobility assessment   What is the highest level of mobility based on the mobility assessment? Level 5 (Ambulates independently) - Balance while walking independently - Complete Level 5 (Ambulates independently) - Balance while walking independently - Complete Level 5 (Ambulates independently) - Balance while walking independently - Complete Level 5 (Ambulates independently) - Balance while walking independently - Complete Level 5 (Ambulates independently) - Balance while walking independently - Complete    Row Name 09/01/24 1700 08/31/24 21:00:50         Does the patient have exclusion criteria? No - Perform mobility assessment No - Perform mobility assessment      What is the highest level of mobility based on the mobility assessment? Level 5 (Ambulates independently) - Balance while walking independently - Complete Level 5 (Ambulates independently) - Balance while walking independently - Complete         Diet: Diet Orders (From admission, onward)     Start     Ordered   09/03/24 0857  DIET SOFT Room service appropriate? Yes; Fluid consistency: Thin  Diet effective now       Question Answer Comment  Room service appropriate? Yes   Fluid consistency: Thin      09/03/24 0856  Barriers to discharge: None Disposition Plan: Home HH orders placed: N/A Status is: Inpatient  Objective: Blood pressure 115/63, pulse 66, temperature 98 F (36.7 C), temperature source Oral, resp. rate 17, height 5' 6 (1.676 m), weight 78.5 kg, last menstrual period 04/03/2012, SpO2 97%.  Examination:  Physical Exam Constitutional:      Appearance: Normal  appearance.  HENT:     Head: Normocephalic and atraumatic.     Mouth/Throat:     Mouth: Mucous membranes are moist.  Eyes:     Extraocular Movements: Extraocular movements intact.  Cardiovascular:     Rate and Rhythm: Normal rate and regular rhythm.  Pulmonary:     Effort: Pulmonary effort is normal. No respiratory distress.     Breath sounds: Normal breath sounds. No wheezing.  Abdominal:     General: Bowel sounds are normal. There is no distension.     Palpations: Abdomen is soft.     Tenderness: There is generalized abdominal tenderness and tenderness in the left lower quadrant.  Musculoskeletal:        General: Normal range of motion.     Cervical back: Normal range of motion and neck supple.  Skin:    General: Skin is warm and dry.  Neurological:     General: No focal deficit present.     Mental Status: She is alert.  Psychiatric:        Mood and Affect: Mood normal.      Consultants:  GI  Procedures:    Data Reviewed: Results for orders placed or performed during the hospital encounter of 08/31/24 (from the past 24 hours)  Glucose, capillary     Status: None   Collection Time: 09/03/24  5:41 AM  Result Value Ref Range   Glucose-Capillary 80 70 - 99 mg/dL  Basic metabolic panel with GFR     Status: Abnormal   Collection Time: 09/03/24  6:10 AM  Result Value Ref Range   Sodium 140 135 - 145 mmol/L   Potassium 3.4 (L) 3.5 - 5.1 mmol/L   Chloride 103 98 - 111 mmol/L   CO2 26 22 - 32 mmol/L   Glucose, Bld 72 70 - 99 mg/dL   BUN 12 8 - 23 mg/dL   Creatinine, Ser 9.17 0.44 - 1.00 mg/dL   Calcium  9.0 8.9 - 10.3 mg/dL   GFR, Estimated >39 >39 mL/min   Anion gap 11 5 - 15  CBC with Differential/Platelet     Status: None   Collection Time: 09/03/24  6:10 AM  Result Value Ref Range   WBC 9.8 4.0 - 10.5 K/uL   RBC 4.33 3.87 - 5.11 MIL/uL   Hemoglobin 12.6 12.0 - 15.0 g/dL   HCT 62.0 63.9 - 53.9 %   MCV 87.5 80.0 - 100.0 fL   MCH 29.1 26.0 - 34.0 pg   MCHC 33.2  30.0 - 36.0 g/dL   RDW 86.7 88.4 - 84.4 %   Platelets 281 150 - 400 K/uL   nRBC 0.0 0.0 - 0.2 %   Neutrophils Relative % 66 %   Neutro Abs 6.5 1.7 - 7.7 K/uL   Lymphocytes Relative 24 %   Lymphs Abs 2.4 0.7 - 4.0 K/uL   Monocytes Relative 7 %   Monocytes Absolute 0.7 0.1 - 1.0 K/uL   Eosinophils Relative 2 %   Eosinophils Absolute 0.2 0.0 - 0.5 K/uL   Basophils Relative 1 %   Basophils Absolute 0.1 0.0 - 0.1 K/uL   Immature Granulocytes  0 %   Abs Immature Granulocytes 0.04 0.00 - 0.07 K/uL  Magnesium      Status: Abnormal   Collection Time: 09/03/24  6:10 AM  Result Value Ref Range   Magnesium  2.5 (H) 1.7 - 2.4 mg/dL   *Note: Due to a large number of results and/or encounters for the requested time period, some results have not been displayed. A complete set of results can be found in Results Review.    I have reviewed pertinent nursing notes, vitals, labs, and images as necessary. I have ordered labwork to follow up on as indicated.  I have reviewed the last notes from staff over past 24 hours. I have discussed patient's care plan and test results with nursing staff, CM/SW, and other staff as appropriate.  Old records reviewed in assessment of this patient  Time spent: Greater than 50% of the 55 minute visit was spent in counseling/coordination of care for the patient as laid out in the A&P.   LOS: 3 days   Landon FORBES Baller, MD [facility] 09/03/2024, 1:58 PM

## 2024-09-03 NOTE — Progress Notes (Signed)
     [facility] Progress Note  CC:   Colitis/constipation, dysphagia   Subjective:  Advanced to soft diet this AM and ate some of her food.  Feeling a little better compared to yesterday.  Stopped BMs yesterday around 5 PM but then had a very small one this AM.  Objective:  Vital signs in last 24 hours: Temp:  [97.7 F (36.5 C)-98.4 F (36.9 C)] 97.9 F (36.6 C) (11/13 0935) Pulse Rate:  [66-73] 66 (11/13 0935) Resp:  [16-18] 18 (11/13 0935) BP: (121-137)/(58-71) 124/68 (11/13 0935) SpO2:  [94 %-98 %] 98 % (11/13 0935) Last BM Date : 09/02/24 General:  Alert, Well-developed, in NAD Heart:  Regular rate and rhythm; no murmurs Pulm:  CTAB.  No W/R/R. Abdomen:  Soft, non-distended.  BS present.  Minimal LLQ TTP. Extremities:  Without edema. Neurologic:  Alert and oriented x 4;  grossly normal neurologically. Psych:  Alert and cooperative. Normal mood and affect.  Intake/Output from previous day: 11/12 0701 - 11/13 0700 In: 400 [P.O.:400] Out: -   Lab Results: Recent Labs    09/01/24 0627 09/02/24 0557 09/03/24 0610  WBC 10.5 12.1* 9.8  HGB 13.1 12.6 12.6  HCT 39.6 37.5 37.9  PLT 298 306 281   BMET Recent Labs    09/01/24 0627 09/02/24 0557 09/03/24 0610  NA 138 137 140  K 3.3* 3.8 3.4*  CL 102 103 103  CO2 25 23 26   GLUCOSE 93 102* 72  BUN 16 11 12   CREATININE 0.87 0.68 0.82  CALCIUM  9.0 9.1 9.0   LFT Recent Labs    08/31/24 1344  PROT 8.6*  ALBUMIN  4.8  AST 21  ALT 7  ALKPHOS 72  BILITOT 0.6    Assessment / Plan: *Constipation with associated inflammatory changes in the sigmoid colon, suspicious for stercoral colitis.  Got SMOG enema and half miralax  prep and had several bowel movements. *Pyloric stenosis status post dilatation.  Awaiting G-POEM at [facility] in December.  Having recurrent dysphagia/issues with tolerating PO.  S/p EGD with dilation of the pylorus to 16.5 mm with some improvement.   -Continue bentyl  20 mg TID for  cramping (she typically takes it prn at home as well). -Can discontinue Zosyn. -Should continue miralax  daily at home to prevent recurrence of this issue and consider Benefiber as well to help with bulking of the stool so that may help them from getting too loose.  I added once daily Miralax  here starting today.    LOS: 3 days   Harlene BIRCH. Shyane Fossum  09/03/2024, 10:24 AM

## 2024-09-03 NOTE — Plan of Care (Signed)

## 2024-09-03 NOTE — Plan of Care (Signed)

## 2024-09-04 LAB — CBC WITH DIFFERENTIAL/PLATELET
Abs Immature Granulocytes: 0.03 K/uL (ref 0.00–0.07)
Basophils Absolute: 0.1 K/uL (ref 0.0–0.1)
Basophils Relative: 1 %
Eosinophils Absolute: 0.2 K/uL (ref 0.0–0.5)
Eosinophils Relative: 3 %
HCT: 40.7 % (ref 36.0–46.0)
Hemoglobin: 13.3 g/dL (ref 12.0–15.0)
Immature Granulocytes: 0 %
Lymphocytes Relative: 21 %
Lymphs Abs: 1.9 K/uL (ref 0.7–4.0)
MCH: 29.4 pg (ref 26.0–34.0)
MCHC: 32.7 g/dL (ref 30.0–36.0)
MCV: 89.8 fL (ref 80.0–100.0)
Monocytes Absolute: 0.7 K/uL (ref 0.1–1.0)
Monocytes Relative: 8 %
Neutro Abs: 6.1 K/uL (ref 1.7–7.7)
Neutrophils Relative %: 67 %
Platelets: 296 K/uL (ref 150–400)
RBC: 4.53 MIL/uL (ref 3.87–5.11)
RDW: 13.3 % (ref 11.5–15.5)
WBC: 8.9 K/uL (ref 4.0–10.5)
nRBC: 0 % (ref 0.0–0.2)

## 2024-09-04 LAB — BASIC METABOLIC PANEL WITH GFR
Anion gap: 13 (ref 5–15)
BUN: 8 mg/dL (ref 8–23)
CO2: 24 mmol/L (ref 22–32)
Calcium: 9.4 mg/dL (ref 8.9–10.3)
Chloride: 104 mmol/L (ref 98–111)
Creatinine, Ser: 0.7 mg/dL (ref 0.44–1.00)
GFR, Estimated: >60 mL/min (ref >60)
Glucose, Bld: 73 mg/dL (ref 70–99)
Potassium: 3.7 mmol/L (ref 3.5–5.1)
Sodium: 140 mmol/L (ref 135–145)

## 2024-09-04 LAB — MAGNESIUM: Magnesium: 2.6 mg/dL — ABNORMAL HIGH (ref 1.7–2.4)

## 2024-09-04 MED ORDER — SCOPOLAMINE 1 MG/3DAYS TD PT72: 1.0000 | MEDICATED_PATCH | TRANSDERMAL | 12 refills | Status: AC

## 2024-09-04 MED ORDER — MELATONIN 5 MG PO TABS: 5.0000 mg | ORAL_TABLET | Freq: Every evening | ORAL | 0 refills | Status: AC | PRN

## 2024-09-04 MED ORDER — POLYETHYLENE GLYCOL 3350 17 G PO PACK: 17.0000 g | PACK | Freq: Every day | ORAL | 0 refills | Status: AC

## 2024-09-04 NOTE — Progress Notes (Signed)
     [facility] Progress Note  CC:  Colitis/constipation, dysphagia   Subjective:  Feeling much better.  Abdomen still a little sore.  Having BMs, declined the Miralax  yesterday.  Still with some nausea but the scopolamine , phenergan , and zofran  all help some.  Objective:  Vital signs in last 24 hours: Temp:  [97.7 F (36.5 C)-98.8 F (37.1 C)] 97.7 F (36.5 C) (11/14 0446) Pulse Rate:  [65-68] 65 (11/14 0446) Resp:  [17-18] 18 (11/14 0446) BP: (115-147)/(63-74) 147/74 (11/14 0446) SpO2:  [97 %-99 %] 99 % (11/14 0446) Last BM Date : 09/03/24 General:  Alert, Well-developed, in NAD Heart:  Regular rate and rhythm; no murmurs Pulm:  CTAB.  No W/R/R. Abdomen:  Soft, non-distended.  BS present.  Minimal LLQ TTP. Extremities:  Without edema. Neurologic:  Alert and oriented x 4;  grossly normal neurologically. Psych:  Alert and cooperative. Normal mood and affect.  Lab Results: Recent Labs    09/02/24 0557 09/03/24 0610 09/04/24 0630  WBC 12.1* 9.8 8.9  HGB 12.6 12.6 13.3  HCT 37.5 37.9 40.7  PLT 306 281 296   BMET Recent Labs    09/02/24 0557 09/03/24 0610 09/04/24 0630  NA 137 140 140  K 3.8 3.4* 3.7  CL 103 103 104  CO2 23 26 24   GLUCOSE 102* 72 73  BUN 11 12 8   CREATININE 0.68 0.82 0.70  CALCIUM  9.1 9.0 9.4   Assessment / Plan: *Constipation with associated inflammatory changes in the sigmoid colon, suspicious for stercoral colitis.  Got SMOG enema and half miralax  prep and had several bowel movements.  Still passing stool.  Much improved.   *Pyloric stenosis status post dilatation.  Awaiting G[facility]POEM at [facility] in December.  Having recurrent dysphagia/issues with tolerating PO.  S/p EGD with dilation of the pylorus to 16.5 mm with some improvement.   - Continue bentyl  20 mg TID for cramping (she typically takes it prn at home as well). - Should continue miralax  daily at home to prevent recurrence of this issue and consider Benefiber as well to help  with bulking of the stool so that may help them from getting too loose.  Miralax  daily, dose can be titrated. - For discharge today, ok from GI standpoint.    LOS: 4 days   Harlene BIRCH. Sebastian Dzik  09/04/2024, 9:43 AM

## 2024-09-04 NOTE — Plan of Care (Signed)

## 2024-09-04 NOTE — Progress Notes (Signed)
 Pt discharged. taken off unit by charge RN. Discharge paperwork completed by day shift RN. No IV's present on assessment. Patient endorses that she understands discharge plans/education.

## 2024-09-06 NOTE — Discharge Summary (Signed)
 Physician Discharge Summary   Patient: Gina Wise MRN: [id number] DOB: Jul 30, 1961  Admit date:     08/31/2024  Discharge date: 09/04/2024  Discharge Physician: Brigida Bureau   PCP: Almarie Waddell NOVAK, NP   Recommendations at discharge:    Discharge to home Follow up with PCP in 7-10 days Chemistry to be drawn at that visit to be reported to PCP. Keep appointment for POEM procedure at [facility].  Discharge Diagnoses: Principal Problem:   Colitis Active Problems:   Pyloric stenosis   Hypothyroidism   Essential hypertension, benign   Obesity   Intractable vomiting with nausea   Constipation   Gastritis and duodenitis   Abdominal pain  Resolved Problems:   * No resolved hospital problems. *  Hospital Course: RAETTA Wise is a 63 y.o. female with medical history significant of recurrent pyloric stenosis requiring monthly dilation pending gastric POEM procedure, Sjogren's syndrome,  hypothyroidism, HTN who presented to the ER with abdominal pain in her left lower quadrant and ongoing difficulty swallowing.  She also endorsed feeling constipated with last reported bowel movement on 10/29. Stated her last esophageal dilation was 08/06/2024. CT A/P on admission showed nonspecific sigmoid colitis appearing similar compared to April 2025 CT scan. No obstruction was seen. She was started on Zosyn.  GI was consulted and she was admitted for ongoing workup and monitoring.  She underwent esophageal dilatation on 09/01/2024. She tolerated the procedure well.  She was given IV fluids, and was placed on a bowel regimen. She was given IV protonix .  Electrolytes were monitored and supplemented.  The patient was feeling much better and was stable for discharge to home.   Assessment and Plan: Constipation  Sigmoid colitis  - no obstruction noted on CT. Findings similar to piror CT findings.  - mild WBC on admission, afebrile but rather notable LLQ pain; pain has improved but bloated and gassy with  occasional cramps. - discontinued zosyn, WBC resolved - Iv fluids stopped.  - the patient was tolerated PO - She was cleared for discharged for home   Recurrent pyloric stenosis s/p repeat dilation (most recently 08/06/24) - continue IV Protonix   - has gastric POEM consultation appointment coming up in December at [facility] -GI following.  Status post esophageal dilation 11/11 HTN Hypothyroidism  - Continue home levothyroxine    Hypokalemia - Resolved.  Consultants: Gastroenterology Procedures performed: Esophageal dilatation.  Disposition: Home Diet recommendation:  Discharge Diet Orders (From admission, onward)     Start     Ordered   09/04/24 0000  Diet - low sodium heart healthy        09/04/24 1224           Regular diet DISCHARGE MEDICATION: Allergies as of 09/04/2024       Reactions   Contrast Media [iodinated Contrast Media] Hives, Itching   Other Hives, Swelling, Other (See Comments)   CATS AND CAT DANDER   Codeine Nausea Only   Erythromycin Nausea And Vomiting, Other (See Comments)   Severe abdominal pain   Iohexol  Hives   Metoclopramide  Other (See Comments)   Jittery   Prochlorperazine  Other (See Comments)   Jittery        Medication List     STOP taking these medications    hydrochlorothiazide  25 MG tablet Commonly known as: HYDRODIURIL        TAKE these medications    acetaminophen  500 MG tablet Commonly known as: TYLENOL  Take 1,000 mg by mouth as needed for mild pain (pain score 1-3)  or headache.   carboxymethylcellulose 0.5 % Soln Commonly known as: REFRESH PLUS Place 1 drop into both eyes 3 (three) times daily as needed (dry/irritated eyes).   cetirizine 10 MG tablet Commonly known as: ZYRTEC Take 10 mg by mouth daily.   dicyclomine  20 MG tablet Commonly known as: BENTYL  Take 1 tablet (20 mg total) by mouth 3 (three) times daily as needed for spasms.   diphenhydrAMINE  25 MG tablet Commonly known as: BENADRYL  Take 25 mg by  mouth every 6 (six) hours as needed for allergies.   levothyroxine  150 MCG tablet Commonly known as: SYNTHROID  Take 1 tablet (150 mcg total) by mouth daily. What changed: when to take this   melatonin 5 MG Tabs Take 1 tablet (5 mg total) by mouth at bedtime as needed.   ondansetron  4 MG disintegrating tablet Commonly known as: ZOFRAN -ODT Take 1 tablet (4 mg total) by mouth every 8 (eight) hours as needed for nausea or vomiting. What changed: reasons to take this   pantoprazole  40 MG tablet Commonly known as: PROTONIX  Take 1 tablet (40 mg total) by mouth 2 (two) times daily. What changed: when to take this   polyethylene glycol 17 g packet Commonly known as: MIRALAX  / GLYCOLAX  Take 17 g by mouth daily.   promethazine  12.5 MG tablet Commonly known as: PHENERGAN  Take 1 tablet (12.5 mg total) by mouth every 8 (eight) hours as needed for nausea or vomiting.   scopolamine  1 MG/3DAYS Commonly known as: TRANSDERM-SCOP Place 1 patch (1 mg total) onto the skin every 3 (three) days.   sertraline  100 MG tablet Commonly known as: ZOLOFT  TAKE ONE-HALF TABLET(50 MG) BY MOUTH EVERY DAY What changed: See the new instructions.   SUMAtriptan  100 MG tablet Commonly known as: IMITREX  May repeat in 2 hours if headache persists(Plz sched with new provider) What changed:  how much to take how to take this when to take this reasons to take this additional instructions        Discharge Exam: Filed Weights   08/31/24 2104  Weight: 78.5 kg   Exam:  Constitutional:  The patient is awake, alert, and oriented x 3. No acute distress. Eyes:  pupils and irises appear normal Normal lids and conjunctivae ENMT:  grossly normal hearing  Lips appear normal external ears, nose appear normal Oropharynx: mucosa, tongue,posterior pharynx appear normal Neck:  neck appears normal, no masses, normal ROM, supple no thyromegaly Respiratory:  No increased work of breathing. No wheezes, rales,  or rhonchi No tactile fremitus Cardiovascular:  Regular rate and rhythm No murmurs, ectopy, or gallups. No lateral PMI. No thrills. Abdomen:  Abdomen is soft, non-tender, non-distended No hernias, masses, or organomegaly Normoactive bowel sounds.  Musculoskeletal:  No cyanosis, clubbing, or edema Skin:  No rashes, lesions, ulcers palpation of skin: no induration or nodules Neurologic:  CN 2-12 intact Sensation all 4 extremities intact Psychiatric:  Mental status Mood, affect appropriate Orientation to person, place, time  judgment and insight appear intact   Condition at discharge: fair  The results of significant diagnostics from this hospitalization (including imaging, microbiology, ancillary and laboratory) are listed below for reference.   Imaging Studies: CT ABDOMEN PELVIS WO CONTRAST Result Date: 08/31/2024 EXAM: CT ABDOMEN AND PELVIS WITHOUT CONTRAST 08/31/2024 04:33:48 PM TECHNIQUE: CT of the abdomen and pelvis was performed without the administration of intravenous contrast. Multiplanar reformatted images are provided for review. Automated exposure control, iterative reconstruction, and/or weight-based adjustment of the mA/kV was utilized to reduce the radiation dose to  as low as reasonably achievable. COMPARISON: 01/31/2024 CLINICAL HISTORY: Abdominal pain, acute, nonlocalized; Bowel obstruction suspected. FINDINGS: LOWER CHEST: No acute abnormality. LIVER: The liver is unremarkable. GALLBLADDER AND BILE DUCTS: Gallbladder is unremarkable. No biliary ductal dilatation. SPLEEN: No acute abnormality. PANCREAS: No acute abnormality. ADRENAL GLANDS: No acute abnormality. KIDNEYS, URETERS AND BLADDER: No stones in the kidneys or ureters. No hydronephrosis. No perinephric or periureteral stranding. Urinary bladder is unremarkable. GI AND BOWEL: Stomach demonstrates no acute abnormality. Small duodenal diverticulum. Dense stool in the sigmoid colon with associated bowel wall  thickening and trace adjacent stranding. There is no bowel obstruction. PERITONEUM AND RETROPERITONEUM: No absence. No free air. VASCULATURE: Aorta is normal in caliber. Aortic atherosclerotic calcification. LYMPH NODES: No lymphadenopathy. REPRODUCTIVE ORGANS: No acute abnormality. BONES AND SOFT TISSUES: No acute osseous abnormality. No focal soft tissue abnormality. IMPRESSION: 1. Nonspecific sigmoid colitis, likely infectious or inflammatory and similar to 4 / few 11 / 25 . Electronically signed by: Norman Gatlin MD 08/31/2024 04:58 PM EST RP Workstation: [id number]   DG Abd Portable 2 Views Result Date: 08/31/2024 CLINICAL DATA:  Abdominal pain for 2 weeks.  Constipation. EXAM: PORTABLE ABDOMEN - 2 VIEW COMPARISON:  07/19/2022. FINDINGS: Stool is seen in the majority of the colon. Scattered colonic air-fluid levels. No small bowel dilatation. No unexpected radiopaque calculi. Visualized lung bases are clear. IMPRESSION: Stool is seen in the majority of the colon, compatible with the provided history of constipation. Electronically Signed   By: Newell Eke M.D.   On: 08/31/2024 14:30    Microbiology: Results for orders placed or performed in visit on 11/07/23  Clostridium Difficile by PCR     Status: None   Collection Time: 11/07/23  3:03 PM   Specimen: STOOL   Stool  Result Value Ref Range Status   Toxigenic C. Difficile by PCR Negative Negative Final   *Note: Due to a large number of results and/or encounters for the requested time period, some results have not been displayed. A complete set of results can be found in Results Review.    Labs: CBC: Recent Labs  Lab 08/31/24 1344 08/31/24 2111 09/01/24 0627 09/02/24 0557 09/03/24 0610 09/04/24 0630  WBC 13.4* 10.5 10.5 12.1* 9.8 8.9  NEUTROABS 9.4*  --   --  10.2* 6.5 6.1  HGB 15.1* 13.7 13.1 12.6 12.6 13.3  HCT 45.3 41.6 39.6 37.5 37.9 40.7  MCV 87.3 89.7 89.4 87.8 87.5 89.8  PLT 411* 338 298 306 281 296   Basic  Metabolic Panel: Recent Labs  Lab 08/31/24 1344 08/31/24 2111 09/01/24 0627 09/02/24 0557 09/03/24 0610 09/04/24 0630  NA 137  --  138 137 140 140  K 3.6  --  3.3* 3.8 3.4* 3.7  CL 99  --  102 103 103 104  CO2 21*  --  25 23 26 24   GLUCOSE 106*  --  93 102* 72 73  BUN 21  --  16 11 12 8   CREATININE 0.90 0.84 0.87 0.68 0.82 0.70  CALCIUM  10.0  --  9.0 9.1 9.0 9.4  MG  --   --   --  2.5* 2.5* 2.6*   Liver Function Tests: Recent Labs  Lab 08/31/24 1344  AST 21  ALT 7  ALKPHOS 72  BILITOT 0.6  PROT 8.6*  ALBUMIN  4.8   CBG: Recent Labs  Lab 09/03/24 0541  GLUCAP 80    Discharge time spent: greater than 30 minutes.  Signed: Dequon Schnebly, DO [facility] 09/06/2024

## 2024-09-07 ENCOUNTER — Telehealth: Payer: Self-pay

## 2024-09-07 NOTE — Transitions of Care (Post Inpatient/ED Visit) (Signed)
 09/07/2024  Name: Gina Wise MRN: [id number] DOB: December 05, 1960  Today's TOC FU Call Status: Today's TOC FU Call Status:: Successful TOC FU Call Completed TOC FU Call Complete Date: 09/07/24  Patient's Name and Date of Birth confirmed. DOB, Name  Transition Care Management Follow-up Telephone Call Date of Discharge: 09/04/24 Discharge Facility: [facility]) Type of Discharge: Inpatient Admission Primary Inpatient Discharge Diagnosis:: colitis How have you been since you were released from the hospital?: Better Any questions or concerns?: No  Items Reviewed: Did you receive and understand the discharge instructions provided?: Yes Medications obtained,verified, and reconciled?: Yes (Medications Reviewed) Any new allergies since your discharge?: No Dietary orders reviewed?: Yes Do you have support at home?: Yes  Medications Reviewed Today: Medications Reviewed Today     Reviewed by Lavelle Charmaine NOVAK, LPN (Licensed Practical Nurse) on 09/07/24 at 1007  Med List Status: <None>   Medication Order Taking? Sig Documenting Provider Last Dose Status Informant  acetaminophen  (TYLENOL ) 500 MG tablet [id number] No Take 1,000 mg by mouth as needed for mild pain (pain score 1-3) or headache. [provider] Unknown Active Self  carboxymethylcellulose (REFRESH PLUS) 0.5 % SOLN [id number] No Place 1 drop into both eyes 3 (three) times daily as needed (dry/irritated eyes). [provider] Past Week Active Self  cetirizine (ZYRTEC) 10 MG tablet [id number] No Take 10 mg by mouth daily. [provider] 08/29/2024 Active Self  dicyclomine  (BENTYL ) 20 MG tablet [id number] No Take 1 tablet (20 mg total) by mouth 3 (three) times daily as needed for spasms. Albertus Gordy HERO, MD Unknown Active Self  diphenhydrAMINE  (BENADRYL ) 25 MG tablet [id number] No Take 25 mg by mouth every 6 (six) hours as needed for allergies. [provider] Unknown Active Self  levothyroxine   (SYNTHROID ) 150 MCG tablet [id number] No Take 1 tablet (150 mcg total) by mouth daily.  Patient taking differently: Take 150 mcg by mouth daily before breakfast.   Almarie Waddell NOVAK, NP Past Week Active Self  melatonin 5 MG TABS [id number]  Take 1 tablet (5 mg total) by mouth at bedtime as needed. Swayze, Ava, DO  Active   ondansetron  (ZOFRAN -ODT) 4 MG disintegrating tablet [id number] No Take 1 tablet (4 mg total) by mouth every 8 (eight) hours as needed for nausea or vomiting.  Patient taking differently: Take 4 mg by mouth every 8 (eight) hours as needed for nausea or vomiting (DISSOLVE ORALLY).   Almarie Waddell NOVAK, NP 08/31/2024 Morning Active Self  pantoprazole  (PROTONIX ) 40 MG tablet [id number] No Take 1 tablet (40 mg total) by mouth 2 (two) times daily.  Patient taking differently: Take 40 mg by mouth 2 (two) times daily before a meal.   Charlanne Groom, MD Past Week Active Self  polyethylene glycol (MIRALAX  / GLYCOLAX ) 17 g packet [id number]  Take 17 g by mouth daily. Swayze, Ava, DO  Active   promethazine  (PHENERGAN ) 12.5 MG tablet [id number] No Take 1 tablet (12.5 mg total) by mouth every 8 (eight) hours as needed for nausea or vomiting. Albertus Gordy HERO, MD Unknown Active Self  scopolamine  (TRANSDERM-SCOP) 1 MG/3DAYS [id number]  Place 1 patch (1 mg total) onto the skin every 3 (three) days. Swayze, Ava, DO  Active   sertraline  (ZOLOFT ) 100 MG tablet [id number] No TAKE ONE-HALF TABLET(50 MG) BY MOUTH EVERY DAY  Patient taking differently: Take 50 mg by mouth in the morning.   Almarie Waddell NOVAK, NP 08/29/2024 Active Self  SUMAtriptan  (IMITREX ) 100 MG tablet [id number] No May repeat in  2 hours if headache persists(Plz sched with new provider)  Patient taking differently: Take 100 mg by mouth daily as needed for migraine (may repeat ONCE in 2 hours, if no relief).   Almarie Waddell NOVAK, NP Unknown Active Self            Home Care and Equipment/Supplies: Were Home Health Services Ordered?: NA Any new equipment  or medical supplies ordered?: NA  Functional Questionnaire: Do you need assistance with bathing/showering or dressing?: No Do you need assistance with meal preparation?: No Do you need assistance with eating?: No Do you have difficulty maintaining continence: No Do you need assistance with getting out of bed/getting out of a chair/moving?: No Do you have difficulty managing or taking your medications?: No  Follow up appointments reviewed: PCP Follow-up appointment confirmed?: No (patient declined to schedule due to needing to return to work and following up with specialist) Specialist Hospital Follow-up appointment confirmed?: Yes Date of Specialist follow-up appointment?: 10/08/24 Follow-Up Specialty Provider:: [facility] Do you need transportation to your follow-up appointment?: No Do you understand care options if your condition(s) worsen?: Yes-patient verbalized understanding    SIGNATURE Charmaine Bloodgood, LPN [facility] Health Advisor [place] [facility] You Are. We Are. [facility] Direct Dial [phone]

## 2024-09-11 ENCOUNTER — Other Ambulatory Visit: Payer: Self-pay | Admitting: Internal Medicine

## 2024-09-11 ENCOUNTER — Telehealth: Payer: Self-pay | Admitting: Internal Medicine

## 2024-09-11 DIAGNOSIS — R112 Nausea with vomiting, unspecified: Secondary | ICD-10-CM

## 2024-09-11 NOTE — Telephone Encounter (Signed)
 Inbound call from patient stating she was recently at the hospital and was treated for colitis but now she feels like her c-diff is back and is a bit concerned, patient would like to speak to nurse. Requesting a call back  Please advise  Thank you

## 2024-09-15 NOTE — Telephone Encounter (Signed)
 Called pt and left message for pt to call back. Pt did not return the phone call. Will await further communication from pt.

## 2024-09-18 ENCOUNTER — Other Ambulatory Visit (HOSPITAL_BASED_OUTPATIENT_CLINIC_OR_DEPARTMENT_OTHER): Payer: Self-pay

## 2024-09-22 ENCOUNTER — Other Ambulatory Visit: Payer: Self-pay | Admitting: Family Medicine

## 2024-09-22 DIAGNOSIS — E039 Hypothyroidism, unspecified: Secondary | ICD-10-CM

## 2024-10-01 ENCOUNTER — Encounter: Payer: Self-pay | Admitting: Internal Medicine

## 2024-10-01 NOTE — Telephone Encounter (Signed)
 Not that I have seen Alan, have you seen such paperwork? I will fill it out when we have it.   Please let me know. JMP

## 2024-10-20 ENCOUNTER — Encounter: Payer: Self-pay | Admitting: Family Medicine

## 2024-10-26 ENCOUNTER — Encounter: Payer: Self-pay | Admitting: Family Medicine

## 2024-10-27 ENCOUNTER — Ambulatory Visit: Admitting: Family Medicine

## 2024-11-02 NOTE — Progress Notes (Incomplete)
 "  Acute Office Visit  Subjective:  Patient ID: Gina Wise, female    DOB: 1960/12/14  Age: 64 y.o. MRN: [id number]  CC: No chief complaint on file.     HPI DAVIONA Wise is here for ***.   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   Allergy    Anemia    Anxiety    Aortic atherosclerosis    Bruising 05/10/2015   CAP (community acquired pneumonia) 08/03/2015   Chronic bronchitis (HCC)    get it q yr; in the spring time (02/17/2014)   COVID-19    Depression    have taken zoloft  since 1996 (02/17/2014)   Diverticulosis    Duodenal diverticulum    Dyspnea    from scar tissue   Epistaxis 12/01/2015   Gastritis    GERD (gastroesophageal reflux disease)    Hematemesis with nausea 08/01/2016   Hiatal hernia    High cholesterol    Hypertension    Hypothyroid    Internal hemorrhoids    Migraines    maybe a couple/yr (02/17/2014)   Multiple environmental allergies    Overweight(278.02)    Peri-menopause    PONV (postoperative nausea and vomiting)    Pre-diabetes    Schatzki's ring    Sjogren's disease    Systemic lupus (HCC)     Past Surgical History:  Procedure Laterality Date   APPENDECTOMY  1987   BREAST BIOPSY Left 01/30/2018   BREAST EXCISIONAL BIOPSY Left 02/25/2018   ESOPHAGEAL MANOMETRY N/A 05/02/2022   Procedure: ESOPHAGEAL MANOMETRY (EM);  Surgeon: Albertus Gordy HERO, MD;  Location: [facility];  Service: Gastroenterology;  Laterality: N/A;   ESOPHAGOGASTRODUODENOSCOPY N/A 02/28/2014   Procedure: ESOPHAGOGASTRODUODENOSCOPY (EGD);  Surgeon: Gordy HERO Albertus, MD;  Location: [facility] ENDOSCOPY;  Service: Endoscopy;  Laterality: N/A;   ESOPHAGOGASTRODUODENOSCOPY N/A 08/02/2016   Procedure: ESOPHAGOGASTRODUODENOSCOPY (EGD);  Surgeon: Lupita FORBES Commander, MD;  Location: [facility] END[facility]COPY;  Service: Endoscopy;  Laterality: N/A;   ESOPHAGOGASTRODUODENOSCOPY N/A 09/01/2024   Procedure: EGD (ESOPHAGOGASTRODUODENOSCOPY);  Surgeon: Suzann Inocente HERO, MD;   Location: [facility];  Service: Gastroenterology;  Laterality: N/A;   EXPLORATORY LAPAROTOMY  1987   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   FOOT SURGERY Right ~1980   don't know what they did; had to take something out   HERNIA REPAIR     INGUINAL HERNIA REPAIR Right ~ 2003   RADIOACTIVE SEED GUIDED EXCISIONAL BREAST BIOPSY Left 02/25/2018   Procedure: RADIOACTIVE SEED GUIDED EXCISIONAL BREAST BIOPSY;  Surgeon: Ebbie Cough, MD;  Location: [facility];  Service: General;  Laterality: Left;   SHOULDER OPEN ROTATOR CUFF REPAIR Left 1995   SHOULDER SURGERY Left 1997   cartilage   TONSILLECTOMY AND ADENOIDECTOMY  1976   TUBAL LIGATION  1998   laparoscopic   VAGINAL HYSTERECTOMY  04/08/2012   menorrhagia   VAGINAL PROLAPSE REPAIR  08/04/2018   Anterior and posterior with colp-mesh-Dr. Comer Ku @ [facility]   XI ROBOTIC ASSISTED HIATAL HERNIA REPAIR N/A 05/21/2022   Procedure: XI ROBOTIC ASSISTED HIATAL HERNIA REPAIR WITH TOUPET FUNDOPLICATION;  Surgeon: Signe Mitzie LABOR, MD;  Location: [facility];  Service: General;  Laterality: N/A;    Family History  Problem Relation Age of Onset   Hypertension Mother    Heart disease Mother    Hepatitis Mother        Hep C   Endometriosis Mother    Pancreatic disease Mother    Depression Mother  Hearing loss Mother    Hypertension Father    Vision loss Father    Alcohol  abuse Father    Emphysema Father        Smoker   Hearing loss Father    Early death Father        Murdered   Lupus Sister    Endometriosis Sister    Arthritis Sister    Depression Sister    Uterine cancer Maternal Aunt        50's   Heart attack Maternal Uncle    Early death Maternal Uncle        Murdered   Breast cancer Paternal Aunt        47's   Early death Paternal Aunt    Emphysema Paternal Aunt        Non-smoker/Died of Pneumonia   Lung disease Paternal Aunt        Bronchiecstasis   Lung cancer Paternal Uncle         smoker   Lung cancer Paternal Uncle    Lung cancer Paternal Uncle    Early death Paternal Uncle        Carbon Monoxide Poisoning/Accidental   Parkinson's disease Maternal Grandmother    Alcohol  abuse Maternal Grandmother    Colon cancer Maternal Grandfather    Heart disease Paternal Grandmother        Died during angioplasty surgery   Aneurysm Paternal Grandfather    Allergy (severe) Son        ASA-Idiopathic Thrombocytopenia   Pancreatic cancer Neg Hx    Esophageal cancer Neg Hx    Rectal cancer Neg Hx    Stomach cancer Neg Hx     Social History   Socioeconomic History   Marital status: Single    Spouse name: Not on file   Number of children: 2   Years of education: Not on file   Highest education level: Associate degree: academic program  Occupational History   Not on file  Tobacco Use   Smoking status: Never   Smokeless tobacco: Never  Vaping Use   Vaping status: Never Used  Substance and Sexual Activity   Alcohol  use: Yes    Comment: rarely   Drug use: No   Sexual activity: Not on file  Other Topics Concern   Not on file  Social History Narrative   Works at [employer]   Social Drivers of Health   Tobacco Use: Low Risk  (10/08/2024)   [facility]   Patient History    Smoking Tobacco Use: Never    Smokeless Tobacco Use: Never    Passive Exposure: Not on file  Financial Resource Strain: Medium Risk (10/22/2024)   Overall Financial Resource Strain (CARDIA)    Difficulty of Paying Living Expenses: Somewhat hard  Food Insecurity: No Food Insecurity (10/22/2024)   Epic    Worried About Radiation Protection Practitioner of Food in the Last Year: Never true    Ran Out of Food in the Last Year: Never true  Transportation Needs: No Transportation Needs (10/22/2024)   Epic    Lack of Transportation (Medical): No    Lack of Transportation (Non-Medical): No  Physical Activity: Inactive (10/22/2024)   Exercise Vital Sign    Days of Exercise per Week: 0 days     Minutes of Exercise per Session: Not on file  Stress: No Stress Concern Present (10/22/2024)   [employer] of Occupational Health - Occupational Stress Questionnaire    Feeling of  Stress: Only a little  Social Connections: Socially Isolated (10/22/2024)   Social Connection and Isolation Panel    Frequency of Communication with Friends and Family: More than three times a week    Frequency of Social Gatherings with Friends and Family: Once a week    Attends Religious Services: Never    Database Administrator or Organizations: No    Attends Engineer, Structural: Not on file    Marital Status: Divorced  Intimate Partner Violence: Not At Risk (08/31/2024)   Epic    Fear of Current or Ex-Partner: No    Emotionally Abused: No    Physically Abused: No    Sexually Abused: No  Depression (PHQ2-9): Low Risk (02/01/2022)   Depression (PHQ2-9)    PHQ-2 Score: 0  Alcohol  Screen: Low Risk (12/19/2023)   Alcohol  Screen    Last Alcohol  Screening Score (AUDIT): 0  Housing: High Risk (10/22/2024)   Epic    Unable to Pay for Housing in the Last Year: Yes    Number of Times Moved in the Last Year: 0    Homeless in the Last Year: No  Utilities: Not At Risk (08/31/2024)   Epic    Threatened with loss of utilities: No  Health Literacy: Not on file    ROS All ROS negative except what is listed in the HPI.   Objective:   Today's Vitals: LMP 04/03/2012 Comment: Still has Cervix and Ovaries  Physical Exam  Assessment & Plan:   Problem List Items Addressed This Visit   None     Follow-up: No follow-ups on file.   Waddell FURY Almarie, DNP, FNP-C  I,Emily Lagle,acting as a neurosurgeon for Waddell KATHEE Almarie, NP.,have documented all relevant documentation on the behalf of Waddell KATHEE Almarie, NP.   I, Waddell KATHEE Almarie, NP, have reviewed all documentation for this visit. The documentation on 11/05/2024 for the exam, diagnosis, procedures, and orders are all accurate and complete. "

## 2024-11-05 ENCOUNTER — Ambulatory Visit: Admitting: Family Medicine

## 2024-11-05 DIAGNOSIS — G4700 Insomnia, unspecified: Secondary | ICD-10-CM

## 2024-11-05 DIAGNOSIS — E039 Hypothyroidism, unspecified: Secondary | ICD-10-CM

## 2024-11-05 DIAGNOSIS — E782 Mixed hyperlipidemia: Secondary | ICD-10-CM

## 2024-11-05 DIAGNOSIS — I10 Essential (primary) hypertension: Secondary | ICD-10-CM

## 2024-11-19 ENCOUNTER — Ambulatory Visit: Admitting: Family Medicine

## 2024-11-19 VITALS — BP 144/78 | HR 78 | Ht 66.0 in | Wt 177.0 lb

## 2024-11-19 DIAGNOSIS — I10 Essential (primary) hypertension: Secondary | ICD-10-CM

## 2024-11-19 DIAGNOSIS — E611 Iron deficiency: Secondary | ICD-10-CM

## 2024-11-19 DIAGNOSIS — E878 Other disorders of electrolyte and fluid balance, not elsewhere classified: Secondary | ICD-10-CM

## 2024-11-19 DIAGNOSIS — E039 Hypothyroidism, unspecified: Secondary | ICD-10-CM | POA: Diagnosis not present

## 2024-11-19 DIAGNOSIS — K311 Adult hypertrophic pyloric stenosis: Secondary | ICD-10-CM | POA: Diagnosis not present

## 2024-11-19 LAB — COMPREHENSIVE METABOLIC PANEL WITH GFR
ALT: 14 U/L (ref 3–35)
AST: 21 U/L (ref 5–37)
Albumin: 4.5 g/dL (ref 3.5–5.2)
Alkaline Phosphatase: 64 U/L (ref 39–117)
BUN: 17 mg/dL (ref 6–23)
CO2: 28 meq/L (ref 19–32)
Calcium: 10 mg/dL (ref 8.4–10.5)
Chloride: 101 meq/L (ref 96–112)
Creatinine, Ser: 0.71 mg/dL (ref 0.40–1.20)
GFR: 90.93 mL/min
Glucose, Bld: 91 mg/dL (ref 70–99)
Potassium: 3.6 meq/L (ref 3.5–5.1)
Sodium: 139 meq/L (ref 135–145)
Total Bilirubin: 0.6 mg/dL (ref 0.2–1.2)
Total Protein: 7.7 g/dL (ref 6.0–8.3)

## 2024-11-19 LAB — CBC WITH DIFFERENTIAL/PLATELET
Basophils Absolute: 0.1 10*3/uL (ref 0.0–0.1)
Basophils Relative: 0.6 % (ref 0.0–3.0)
Eosinophils Absolute: 0.4 10*3/uL (ref 0.0–0.7)
Eosinophils Relative: 4.3 % (ref 0.0–5.0)
HCT: 40 % (ref 36.0–46.0)
Hemoglobin: 13.7 g/dL (ref 12.0–15.0)
Lymphocytes Relative: 25.5 % (ref 12.0–46.0)
Lymphs Abs: 2.4 10*3/uL (ref 0.7–4.0)
MCHC: 34.2 g/dL (ref 30.0–36.0)
MCV: 86.2 fl (ref 78.0–100.0)
Monocytes Absolute: 0.9 10*3/uL (ref 0.1–1.0)
Monocytes Relative: 9.7 % (ref 3.0–12.0)
Neutro Abs: 5.7 10*3/uL (ref 1.4–7.7)
Neutrophils Relative %: 59.9 % (ref 43.0–77.0)
Platelets: 289 10*3/uL (ref 150.0–400.0)
RBC: 4.64 Mil/uL (ref 3.87–5.11)
RDW: 14.1 % (ref 11.5–15.5)
WBC: 9.5 10*3/uL (ref 4.0–10.5)

## 2024-11-19 LAB — IBC + FERRITIN
Ferritin: 11.6 ng/mL (ref 10.0–291.0)
Iron: 89 ug/dL (ref 42–145)
Saturation Ratios: 22.8 % (ref 20.0–50.0)
TIBC: 390.6 ug/dL (ref 250.0–450.0)
Transferrin: 279 mg/dL (ref 212.0–360.0)

## 2024-11-19 LAB — MAGNESIUM: Magnesium: 2.1 mg/dL (ref 1.5–2.5)

## 2024-11-19 LAB — TSH: TSH: 7.69 u[IU]/mL — ABNORMAL HIGH (ref 0.35–5.50)

## 2024-11-19 NOTE — Assessment & Plan Note (Signed)
 Continue current synthroid  Repeat TSH

## 2024-11-19 NOTE — Assessment & Plan Note (Signed)
 Labs today.

## 2024-11-19 NOTE — Assessment & Plan Note (Signed)
 Persistent gastrointestinal dysmotility with abdominal discomfort. X-ray showed significant stool burden. Following with [facility].  - Continue GI regimen.  - Maintain communication with GI team and keep next follow-up xray.  - Patient aware of signs/symptoms requiring further/urgent evaluation.

## 2024-11-19 NOTE — Progress Notes (Signed)
 "  Acute Office Visit  Subjective:  Patient ID: Gina Wise, female    DOB: Apr 09, 1961  Age: 64 y.o. MRN: [id number]  CC:  Chief Complaint  Patient presents with   Medical Management of Chronic Issues      HPI Gina Wise is here for post-operative follow up.   Benign Intrinsic Moderate Pyloric Stenosis: - Underwent Gastric Peroral Endoscopic Myotomy On December 18th, 2025. - Following this, she was seen back by GI on December 29th with complaints of nausea, vomiting, abdominal pain, and constipation (BM q4-5 days). - She was instructed to continue patch, promethazine , and Zofran  for nausea, and Dicyclomine  for lower abdominal cramping and pain.  - October 26, 2024 X-ray of the abdomen: non obstructive gas pattern with moderate to large volume stool burden.    Discussed the use of AI scribe software for clinical note transcription with the patient, who gave verbal consent to proceed.  History of Present Illness Gina Wise is a 64 year old female with pyloric stenosis who presents for follow-up  She has a history of pyloric stenosis, diagnosed in April of the previous year, and underwent monthly dilations followed by surgery on December 18th at [facility]. Despite the surgery, she continues to experience gastrointestinal symptoms, including abdominal discomfort and pain after eating. No food regurgitation is noted, but she experiences breakthrough diarrhea, described as 'leaking around' the constipation.  She has been taking Miralax  regularly and uses Dulcolax every Saturday. She is uncertain if these measures are effectively clearing the stool burden. She has been unable to return to [facility] for follow-up x-rays due to scheduling and weather issues but plans to go back on February 12th. She has been communicating with her specialist.   She is trying to stay hydrated but notes that her lips are dry, indicating possible dehydration. She uses a humidifier to help with the dry air. She  would like us  to recheck her thyroid  levels today since it's been awhile since she's been here. She mentions her sister's thyroid  issues, which require oncologist consultation, and notes that her family has a lot going on medically.       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   Allergy    Anemia    Anxiety    Aortic atherosclerosis    Bruising 05/10/2015   CAP (community acquired pneumonia) 08/03/2015   Chronic bronchitis (HCC)    get it q yr; in the spring time (02/17/2014)   COVID-19    Depression    have taken zoloft  since 1996 (02/17/2014)   Diverticulosis    Duodenal diverticulum    Dyspnea    from scar tissue   Epistaxis 12/01/2015   Gastritis    GERD (gastroesophageal reflux disease)    Hematemesis with nausea 08/01/2016   Hiatal hernia    High cholesterol    Hypertension    Hypothyroid    Internal hemorrhoids    Migraines    maybe a couple/yr (02/17/2014)   Multiple environmental allergies    Overweight(278.02)    Peri-menopause    PONV (postoperative nausea and vomiting)    Pre-diabetes    Schatzki's ring    Sjogren's disease    Systemic lupus (HCC)     Past Surgical History:  Procedure Laterality Date   APPENDECTOMY  1987   BREAST BIOPSY Left 01/30/2018   BREAST EXCISIONAL BIOPSY Left 02/25/2018   ESOPHAGEAL MANOMETRY N/A 05/02/2022   Procedure: ESOPHAGEAL MANOMETRY (EM);  Surgeon: Albertus,  Gordy HERO, MD;  Location: [facility];  Service: Gastroenterology;  Laterality: N/A;   ESOPHAGOGASTRODUODENOSCOPY N/A 02/28/2014   Procedure: ESOPHAGOGASTRODUODENOSCOPY (EGD);  Surgeon: Gordy HERO Starch, MD;  Location: [facility];  Service: Endoscopy;  Laterality: N/A;   ESOPHAGOGASTRODUODENOSCOPY N/A 08/02/2016   Procedure: ESOPHAGOGASTRODUODENOSCOPY (EGD);  Surgeon: Lupita FORBES Commander, MD;  Location: [facility];  Service: Endoscopy;  Laterality: N/A;   ESOPHAGOGASTRODUODENOSCOPY N/A 09/01/2024   Procedure: EGD  (ESOPHAGOGASTRODUODENOSCOPY);  Surgeon: Suzann Inocente HERO, MD;  Location: [facility];  Service: Gastroenterology;  Laterality: N/A;   EXPLORATORY LAPAROTOMY  1987   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   FOOT SURGERY Right ~1980   don't know what they did; had to take something out   HERNIA REPAIR     INGUINAL HERNIA REPAIR Right ~ 2003   RADIOACTIVE SEED GUIDED EXCISIONAL BREAST BIOPSY Left 02/25/2018   Procedure: RADIOACTIVE SEED GUIDED EXCISIONAL BREAST BIOPSY;  Surgeon: Ebbie Cough, MD;  Location: [facility];  Service: General;  Laterality: Left;   SHOULDER OPEN ROTATOR CUFF REPAIR Left 1995   SHOULDER SURGERY Left 1997   cartilage   TONSILLECTOMY AND ADENOIDECTOMY  1976   TUBAL LIGATION  1998   laparoscopic   VAGINAL HYSTERECTOMY  04/08/2012   menorrhagia   VAGINAL PROLAPSE REPAIR  08/04/2018   Anterior and posterior with colp-mesh-Dr. Comer Ku @ [facility]   XI ROBOTIC ASSISTED HIATAL HERNIA REPAIR N/A 05/21/2022   Procedure: XI ROBOTIC ASSISTED HIATAL HERNIA REPAIR WITH TOUPET FUNDOPLICATION;  Surgeon: Signe Mitzie LABOR, MD;  Location: [facility];  Service: General;  Laterality: N/A;    Family History  Problem Relation Age of Onset   Hypertension Mother    Heart disease Mother    Hepatitis Mother        Hep C   Endometriosis Mother    Pancreatic disease Mother    Depression Mother    Hearing loss Mother    Hypertension Father    Vision loss Father    Alcohol  abuse Father    Emphysema Father        Smoker   Hearing loss Father    Early death Father        Murdered   Lupus Sister    Endometriosis Sister    Arthritis Sister    Depression Sister    Uterine cancer Maternal Aunt        50's   Heart attack Maternal Uncle    Early death Maternal Uncle        Murdered   Breast cancer Paternal Aunt        28's   Early death Paternal Aunt    Emphysema Paternal Aunt        Non-smoker/Died of Pneumonia   Lung disease Paternal Aunt         Bronchiecstasis   Lung cancer Paternal Uncle        smoker   Lung cancer Paternal Uncle    Lung cancer Paternal Uncle    Early death Paternal Uncle        Carbon Monoxide Poisoning/Accidental   Parkinson's disease Maternal Grandmother    Alcohol  abuse Maternal Grandmother    Colon cancer Maternal Grandfather    Heart disease Paternal Grandmother        Died during angioplasty surgery   Aneurysm Paternal Grandfather    Allergy (severe) Son        ASA-Idiopathic Thrombocytopenia   Pancreatic cancer Neg Hx    Esophageal cancer Neg Hx  Rectal cancer Neg Hx    Stomach cancer Neg Hx     Social History   Socioeconomic History   Marital status: Single    Spouse name: Not on file   Number of children: 2   Years of education: Not on file   Highest education level: Associate degree: academic program  Occupational History   Not on file  Tobacco Use   Smoking status: Never   Smokeless tobacco: Never  Vaping Use   Vaping status: Never Used  Substance and Sexual Activity   Alcohol  use: Yes    Comment: rarely   Drug use: No   Sexual activity: Not on file  Other Topics Concern   Not on file  Social History Narrative   Works at [employer]   Social Drivers of Health   Tobacco Use: Low Risk (11/19/2024)   Patient History    Smoking Tobacco Use: Never    Smokeless Tobacco Use: Never    Passive Exposure: Not on file  Financial Resource Strain: Medium Risk (11/19/2024)   Overall Financial Resource Strain (CARDIA)    Difficulty of Paying Living Expenses: Somewhat hard  Food Insecurity: No Food Insecurity (11/19/2024)   Epic    Worried About Radiation Protection Practitioner of Food in the Last Year: Never true    Ran Out of Food in the Last Year: Never true  Transportation Needs: No Transportation Needs (11/19/2024)   Epic    Lack of Transportation (Medical): No    Lack of Transportation (Non-Medical): No  Physical Activity: Inactive (11/19/2024)   Exercise Vital Sign    Days of Exercise per  Week: 0 days    Minutes of Exercise per Session: Not on file  Stress: No Stress Concern Present (11/19/2024)   [facility] [employer] - Occupational Stress Questionnaire    Feeling of Stress: Only a little  Social Connections: Socially Isolated (11/19/2024)   Social Connection and Isolation Panel    Frequency of Communication with Friends and Family: More than three times a week    Frequency of Social Gatherings with Friends and Family: Once a week    Attends Religious Services: Never    Database Administrator or Organizations: No    Attends Engineer, Structural: Not on file    Marital Status: Divorced  Intimate Partner Violence: Not At Risk (08/31/2024)   Epic    Fear of Current or Ex-Partner: No    Emotionally Abused: No    Physically Abused: No    Sexually Abused: No  Depression (PHQ2-9): Low Risk (11/19/2024)   Depression (PHQ2-9)    PHQ-2 Score: 3  Alcohol  Screen: Low Risk (12/19/2023)   Alcohol  Screen    Last Alcohol  Screening Score (AUDIT): 0  Housing: High Risk (11/19/2024)   Epic    Unable to Pay for Housing in the Last Year: Yes    Number of Times Moved in the Last Year: 0    Homeless in the Last Year: No  Utilities: Not At Risk (08/31/2024)   Epic    Threatened with loss of utilities: No  Health Literacy: Not on file    ROS All ROS negative except what is listed in the HPI.   Objective:   Today's Vitals: BP (!) 144/78   Pulse 78   Ht 5' 6 (1.676 m)   Wt 177 lb (80.3 kg)   LMP 04/03/2012 Comment: Still has Cervix and Ovaries  SpO2 99%   BMI 28.57 kg/m   Physical Exam Vitals  reviewed.  Constitutional:      General: She is not in acute distress.    Appearance: Normal appearance. She is not ill-appearing.  Cardiovascular:     Rate and Rhythm: Normal rate and regular rhythm.  Pulmonary:     Effort: Pulmonary effort is normal.     Breath sounds: Normal breath sounds.  Abdominal:     General: There is no distension.      Palpations: Abdomen is soft. There is no mass.     Tenderness: There is no abdominal tenderness. There is no guarding.     Comments: Slightly hypoactive bowel sounds   Skin:    General: Skin is warm and dry.  Neurological:     Mental Status: She is alert and oriented to person, place, and time.  Psychiatric:        Mood and Affect: Mood normal.        Behavior: Behavior normal.        Thought Content: Thought content normal.        Judgment: Judgment normal.       Assessment & Plan:   Problem List Items Addressed This Visit       Active Problems   Hypothyroidism - Primary   Continue current synthroid  Repeat TSH      Relevant Orders   TSH (Completed)   Essential hypertension, benign   Blood pressure slightly elevated, possibly due to gastrointestinal discomfort. - Monitor blood pressure at home and report average readings in two weeks. - Ensure adequate hydration.      Relevant Medications   hydrochlorothiazide  (HYDRODIURIL ) 25 MG tablet   Other Relevant Orders   Comprehensive metabolic panel with GFR (Completed)   Iron deficiency   Labs today       Relevant Orders   CBC with Differential/Platelet (Completed)   IBC + Ferritin (Completed)   Pyloric stenosis   Persistent gastrointestinal dysmotility with abdominal discomfort. X-ray showed significant stool burden. Following with [facility].  - Continue GI regimen.  - Maintain communication with GI team and keep next follow-up xray.  - Patient aware of signs/symptoms requiring further/urgent evaluation.       Other Visit Diagnoses       Electrolyte imbalance       Relevant Orders   Comprehensive metabolic panel with GFR (Completed)   Magnesium  (Completed)        Follow-up: Return for - pending results or sooner if needed; 6 month follow-up.   Waddell FURY Almarie, DNP, FNP-C  I,Emily Lagle,acting as a neurosurgeon for Waddell KATHEE Almarie, NP.,have documented all relevant documentation on the behalf of Waddell KATHEE Almarie,  NP.   I, Waddell KATHEE Almarie, NP, have reviewed all documentation for this visit. The documentation on 11/19/2024 for the exam, diagnosis, procedures, and orders are all accurate and complete. "

## 2024-11-19 NOTE — Assessment & Plan Note (Signed)
 Blood pressure slightly elevated, possibly due to gastrointestinal discomfort. - Monitor blood pressure at home and report average readings in two weeks. - Ensure adequate hydration.

## 2024-11-20 ENCOUNTER — Ambulatory Visit: Payer: Self-pay | Admitting: Family Medicine

## 2024-11-20 DIAGNOSIS — E039 Hypothyroidism, unspecified: Secondary | ICD-10-CM

## 2024-11-20 MED ORDER — LEVOTHYROXINE SODIUM 175 MCG PO TABS: 175.0000 ug | ORAL_TABLET | Freq: Every day | ORAL | 1 refills | Status: AC

## 2024-11-23 ENCOUNTER — Encounter: Payer: Self-pay | Admitting: Family Medicine

## 2024-11-24 MED ORDER — PANTOPRAZOLE SODIUM 40 MG PO TBEC: 40.0000 mg | DELAYED_RELEASE_TABLET | Freq: Two times a day (BID) | ORAL | 2 refills | Status: AC
